# Patient Record
Sex: Female | Born: 1965 | Race: White | Hispanic: No | Marital: Married | Smoking: Never smoker
Health system: Southern US, Community
[De-identification: ages and names within clinical notes are randomized; demographics above are authoritative.]

## PROBLEM LIST (undated history)

## (undated) DIAGNOSIS — E119 Type 2 diabetes mellitus without complications: Secondary | ICD-10-CM

## (undated) DIAGNOSIS — R413 Other amnesia: Secondary | ICD-10-CM

## (undated) DIAGNOSIS — R7303 Prediabetes: Secondary | ICD-10-CM

## (undated) DIAGNOSIS — T50A95A Adverse effect of other bacterial vaccines, initial encounter: Secondary | ICD-10-CM

## (undated) DIAGNOSIS — Z9889 Other specified postprocedural states: Secondary | ICD-10-CM

## (undated) DIAGNOSIS — E559 Vitamin D deficiency, unspecified: Secondary | ICD-10-CM

## (undated) DIAGNOSIS — F419 Anxiety disorder, unspecified: Secondary | ICD-10-CM

## (undated) DIAGNOSIS — M2550 Pain in unspecified joint: Secondary | ICD-10-CM

## (undated) DIAGNOSIS — R4189 Other symptoms and signs involving cognitive functions and awareness: Secondary | ICD-10-CM

## (undated) DIAGNOSIS — R600 Localized edema: Secondary | ICD-10-CM

## (undated) DIAGNOSIS — G629 Polyneuropathy, unspecified: Secondary | ICD-10-CM

## (undated) DIAGNOSIS — M1990 Unspecified osteoarthritis, unspecified site: Secondary | ICD-10-CM

## (undated) DIAGNOSIS — G4730 Sleep apnea, unspecified: Secondary | ICD-10-CM

## (undated) DIAGNOSIS — I509 Heart failure, unspecified: Secondary | ICD-10-CM

## (undated) DIAGNOSIS — L719 Rosacea, unspecified: Secondary | ICD-10-CM

## (undated) DIAGNOSIS — F32A Depression, unspecified: Secondary | ICD-10-CM

## (undated) DIAGNOSIS — R29898 Other symptoms and signs involving the musculoskeletal system: Secondary | ICD-10-CM

## (undated) DIAGNOSIS — I10 Essential (primary) hypertension: Secondary | ICD-10-CM

## (undated) DIAGNOSIS — E669 Obesity, unspecified: Secondary | ICD-10-CM

## (undated) DIAGNOSIS — N950 Postmenopausal bleeding: Secondary | ICD-10-CM

## (undated) DIAGNOSIS — R0600 Dyspnea, unspecified: Secondary | ICD-10-CM

## (undated) DIAGNOSIS — E781 Pure hyperglyceridemia: Secondary | ICD-10-CM

## (undated) DIAGNOSIS — F329 Major depressive disorder, single episode, unspecified: Secondary | ICD-10-CM

## (undated) DIAGNOSIS — E8881 Metabolic syndrome: Secondary | ICD-10-CM

## (undated) DIAGNOSIS — M6250 Muscle wasting and atrophy, not elsewhere classified, unspecified site: Secondary | ICD-10-CM

## (undated) DIAGNOSIS — M549 Dorsalgia, unspecified: Secondary | ICD-10-CM

## (undated) DIAGNOSIS — R262 Difficulty in walking, not elsewhere classified: Secondary | ICD-10-CM

## (undated) DIAGNOSIS — R5383 Other fatigue: Secondary | ICD-10-CM

## (undated) DIAGNOSIS — F488 Other specified nonpsychotic mental disorders: Secondary | ICD-10-CM

## (undated) DIAGNOSIS — R112 Nausea with vomiting, unspecified: Secondary | ICD-10-CM

## (undated) DIAGNOSIS — M797 Fibromyalgia: Secondary | ICD-10-CM

## (undated) HISTORY — DX: Other symptoms and signs involving cognitive functions and awareness: R41.89

## (undated) HISTORY — DX: Dorsalgia, unspecified: M54.9

## (undated) HISTORY — DX: Fibromyalgia: M79.7

## (undated) HISTORY — DX: Rosacea, unspecified: L71.9

## (undated) HISTORY — DX: Heart failure, unspecified: I50.9

## (undated) HISTORY — DX: Adverse effect of other bacterial vaccines, initial encounter: T50.A95A

## (undated) HISTORY — DX: Unspecified osteoarthritis, unspecified site: M19.90

## (undated) HISTORY — DX: Dyspnea, unspecified: R06.00

## (undated) HISTORY — DX: Other symptoms and signs involving the musculoskeletal system: R29.898

## (undated) HISTORY — DX: Metabolic syndrome: E88.810

## (undated) HISTORY — DX: Anxiety disorder, unspecified: F41.9

## (undated) HISTORY — DX: Vitamin D deficiency, unspecified: E55.9

## (undated) HISTORY — DX: Type 2 diabetes mellitus without complications: E11.9

## (undated) HISTORY — DX: Major depressive disorder, single episode, unspecified: F32.9

## (undated) HISTORY — PX: OTHER SURGICAL HISTORY: SHX169

## (undated) HISTORY — DX: Essential (primary) hypertension: I10

## (undated) HISTORY — DX: Localized edema: R60.0

## (undated) HISTORY — DX: Postmenopausal bleeding: N95.0

## (undated) HISTORY — DX: Pain in unspecified joint: M25.50

## (undated) HISTORY — DX: Difficulty in walking, not elsewhere classified: R26.2

## (undated) HISTORY — DX: Muscle wasting and atrophy, not elsewhere classified, unspecified site: M62.50

## (undated) HISTORY — DX: Metabolic syndrome: E88.81

## (undated) HISTORY — DX: Depression, unspecified: F32.A

## (undated) HISTORY — DX: Other amnesia: R41.3

## (undated) HISTORY — DX: Other fatigue: R53.83

## (undated) HISTORY — DX: Pure hyperglyceridemia: E78.1

## (undated) HISTORY — DX: Obesity, unspecified: E66.9

## (undated) HISTORY — DX: Polyneuropathy, unspecified: G62.9

## (undated) HISTORY — DX: Prediabetes: R73.03

---
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
## 2006-01-03 ENCOUNTER — Ambulatory Visit: Payer: Self-pay

## 2006-01-14 ENCOUNTER — Ambulatory Visit: Payer: Self-pay

## 2009-08-11 ENCOUNTER — Encounter: Payer: Self-pay | Admitting: Family Medicine

## 2009-08-12 ENCOUNTER — Ambulatory Visit: Payer: Self-pay | Admitting: Family Medicine

## 2009-08-12 DIAGNOSIS — E8881 Metabolic syndrome: Secondary | ICD-10-CM | POA: Insufficient documentation

## 2009-08-12 DIAGNOSIS — F329 Major depressive disorder, single episode, unspecified: Secondary | ICD-10-CM | POA: Insufficient documentation

## 2009-08-12 DIAGNOSIS — R0609 Other forms of dyspnea: Secondary | ICD-10-CM | POA: Insufficient documentation

## 2009-08-12 DIAGNOSIS — E119 Type 2 diabetes mellitus without complications: Secondary | ICD-10-CM | POA: Insufficient documentation

## 2009-08-12 DIAGNOSIS — R0989 Other specified symptoms and signs involving the circulatory and respiratory systems: Secondary | ICD-10-CM

## 2009-08-12 DIAGNOSIS — E1165 Type 2 diabetes mellitus with hyperglycemia: Secondary | ICD-10-CM | POA: Insufficient documentation

## 2009-08-12 DIAGNOSIS — R030 Elevated blood-pressure reading, without diagnosis of hypertension: Secondary | ICD-10-CM | POA: Insufficient documentation

## 2009-08-18 LAB — CONVERTED CEMR LAB
Alkaline Phosphatase: 69 units/L (ref 39–117)
Basophils Absolute: 0 10*3/uL (ref 0.0–0.1)
Bilirubin, Direct: 0 mg/dL (ref 0.0–0.3)
CO2: 26 meq/L (ref 19–32)
Calcium: 8.7 mg/dL (ref 8.4–10.5)
Eosinophils Absolute: 0.2 10*3/uL (ref 0.0–0.7)
GFR calc non Af Amer: 82.04 mL/min (ref >60)
HCT: 37.1 % (ref 36.0–46.0)
Hemoglobin: 12.5 g/dL (ref 12.0–15.0)
Lymphs Abs: 1.8 10*3/uL (ref 0.7–4.0)
MCHC: 33.8 g/dL (ref 30.0–36.0)
Monocytes Absolute: 0.6 10*3/uL (ref 0.1–1.0)
Neutro Abs: 5.7 10*3/uL (ref 1.4–7.7)
Potassium: 4.2 meq/L (ref 3.5–5.1)
RDW: 13.9 % (ref 11.5–14.6)
Sodium: 139 meq/L (ref 135–145)
Total Protein: 7.1 g/dL (ref 6.0–8.3)

## 2009-09-08 ENCOUNTER — Telehealth: Payer: Self-pay | Admitting: Family Medicine

## 2009-09-15 ENCOUNTER — Ambulatory Visit: Payer: Self-pay | Admitting: Family Medicine

## 2009-09-21 LAB — CONVERTED CEMR LAB
FSH: 4.8 milliintl units/mL
Total CHOL/HDL Ratio: 4

## 2009-10-04 ENCOUNTER — Other Ambulatory Visit: Admission: RE | Admit: 2009-10-04 | Discharge: 2009-10-04 | Payer: Self-pay | Admitting: Family Medicine

## 2009-10-04 ENCOUNTER — Ambulatory Visit: Payer: Self-pay | Admitting: Family Medicine

## 2009-10-04 DIAGNOSIS — E781 Pure hyperglyceridemia: Secondary | ICD-10-CM | POA: Insufficient documentation

## 2009-10-04 DIAGNOSIS — M79606 Pain in leg, unspecified: Secondary | ICD-10-CM | POA: Insufficient documentation

## 2009-10-04 DIAGNOSIS — M79609 Pain in unspecified limb: Secondary | ICD-10-CM | POA: Insufficient documentation

## 2009-10-10 ENCOUNTER — Telehealth: Payer: Self-pay | Admitting: Family Medicine

## 2009-10-12 ENCOUNTER — Telehealth: Payer: Self-pay | Admitting: Family Medicine

## 2009-10-13 ENCOUNTER — Ambulatory Visit: Payer: Self-pay | Admitting: Family Medicine

## 2009-10-14 ENCOUNTER — Encounter (INDEPENDENT_AMBULATORY_CARE_PROVIDER_SITE_OTHER): Payer: Self-pay | Admitting: *Deleted

## 2009-10-14 LAB — CONVERTED CEMR LAB: Pap Smear: NEGATIVE

## 2009-10-20 ENCOUNTER — Ambulatory Visit: Payer: Self-pay | Admitting: Family Medicine

## 2009-10-20 ENCOUNTER — Encounter: Payer: Self-pay | Admitting: Family Medicine

## 2009-11-01 ENCOUNTER — Ambulatory Visit: Payer: Self-pay | Admitting: Family Medicine

## 2010-04-18 ENCOUNTER — Encounter (INDEPENDENT_AMBULATORY_CARE_PROVIDER_SITE_OTHER): Payer: Self-pay | Admitting: *Deleted

## 2010-04-18 ENCOUNTER — Ambulatory Visit: Payer: Self-pay | Admitting: Family Medicine

## 2010-04-25 ENCOUNTER — Ambulatory Visit: Payer: Self-pay | Admitting: Family Medicine

## 2010-05-30 NOTE — Progress Notes (Signed)
Summary: regarding lab work  Phone Note Call from Patient   Caller: Patient Call For: Kerby Nora MD Summary of Call: Pt is coming in next week for fasting labs that were missed at last lab appt.  She has physical scheduled in june and she is asking if there are any other tests that you want to order, such as hormone levels.  She is not having any hormonal problems, she would like to have them checked.  Says her insurance has paid for them in the past and probably will again. Initial call taken by: Lowella Petties CMA,  Sep 08, 2009 11:44 AM  Follow-up for Phone Call        I do not routiinely check estorogen or progesterone as they vary day to day and information from these labs is minimal. If she has concerns about whether she is in perimenopause we can check FSH and LH . Follow-up by: Kerby Nora MD,  Sep 08, 2009 10:55 PM  Additional Follow-up for Phone Call Additional follow up Details #1::        LABS ADDED TO APPT Additional Follow-up by: Benny Lennert CMA Duncan Dull),  Sep 09, 2009 8:50 AM

## 2010-05-30 NOTE — Assessment & Plan Note (Signed)
Summary: CHECK LUMP ON ARM/CLE   Vital Signs:  Patient profile:   45 year old female Height:      67 inches Weight:      380.8 pounds BMI:     59.86 Temp:     98.1 degrees F oral Pulse rate:   96 / minute Pulse rhythm:   regular BP sitting:   120 / 72  (left arm) Cuff size:   large  Vitals Entered By: Benny Lennert CMA Duncan Dull) (October 13, 2009 11:36 AM)  History of Present Illness: Chief complaint check lump on arm from tetnus shot 1 week ago  Recieved the vaccine 1 week ago... in 24 hours area was sore. left arm sore, this resolved.  Then noted lump on left lasteral arm, firm, mildly tender, small amount of eryhtema.  COntinues to decrease in size.   Problems Prior to Update: 1)  Routine Gynecological Examination  (ICD-V72.31) 2)  Preventive Health Care  (ICD-V70.0) 3)  Hypertriglyceridemia  (ICD-272.1) 4)  Knee Pain, Left  (ICD-719.46) 5)  Leg Pain, Bilateral  (ICD-729.5) 6)  Other Screening Mammogram  (ICD-V76.12) 7)  Dyspnea On Exertion  (ICD-786.09) 8)  Glucose Intolerance  (ICD-271.3) 9)  Metabolic Syndrome X  (ICD-277.7) 10)  Elevated Blood Pressure Without Diagnosis of Hypertension  (ICD-796.2) 11)  Obesity, Morbid  (ICD-278.01) 12)  Depression, Major  (ICD-296.20)  Current Medications (verified): 1)  Hydrochlorothiazide 25 Mg Tabs (Hydrochlorothiazide) .Marland Kitchen.. 1 Tab By Mouth Daily 2)  Diclofenac Sodium 75 Mg Tbec (Diclofenac Sodium) .Marland Kitchen.. 1 Tab By Mouth Two Times A Day  Allergies: 1)  ! Penicillin 2)  ! Benadryl  Past History:  Past medical, surgical, family and social histories (including risk factors) reviewed, and no changes noted (except as noted below).  Past Surgical History: Reviewed history from 08/12/2009 and no changes required. 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 father: HTN,  mother: RA, COPD MGM: heart issue, CVA , alcoholism MGF: alcoholism PGM: brain  aneurysm  Social History: Reviewed history from 08/12/2009 and no changes required. 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 Resp:  Denies shortness of breath.  Physical Exam  General:  morbidly obese female in NAD Mouth:  Oral mucosa and oropharynx without lesions or exudates.  Teeth in good repair. Neck:  no carotid bruit or thyromegaly no cervical or supraclavicular lymphadenopathy  Skin:  nodule at area of tentanus vaccine...no erytthema, no fluctuance, no pain   Impression & Recommendations:  Problem # 1:  TETANUS VACC CAUS ADVERSE EFFECT THERAPEUTIC USE (ICD-E948.4) Local inflammatory response. No s/s of infection.  Pt reassure. Self limited.   Complete Medication List: 1)  Hydrochlorothiazide 25 Mg Tabs (Hydrochlorothiazide) .Marland Kitchen.. 1 tab by mouth daily 2)  Diclofenac Sodium 75 Mg Tbec (Diclofenac sodium) .Marland Kitchen.. 1 tab by mouth two times a day  Current Allergies (reviewed today): ! PENICILLIN ! BENADRYL

## 2010-05-30 NOTE — Progress Notes (Signed)
Summary: Knot and feverish at Tdap site  Phone Note Call from Patient Call back at Work Phone [phone]   Caller: Patient Call For: Brandy Nora MD Summary of Call: Patient received a Tdap last Tuesday (10/04/2009).  The area has been very sore, some better today but she says there is a large knot at the injection site and it is feverish to touch.  I suggested ice to the area but should she be concerned that it is still like this when it has been almost a week? Initial call taken by: Delilah Shan CMA Duncan Dull),  October 10, 2009 4:10 PM  Follow-up for Phone Call        . Most likely local inflammation/allergic reaction. Can apply ice, benadryl /zyrtec. ...given present for 1 week...make appt to be seen to make sure no abcess/cellulitis.  Follow-up by: Brandy Nora MD,  October 10, 2009 4:35 PM  Additional Follow-up for Phone Call Additional follow up Details #1::        Patient advised.Consuello Masse CMA   Additional Follow-up by: Benny Lennert CMA Duncan Dull),  October 10, 2009 4:47 PM

## 2010-05-30 NOTE — Assessment & Plan Note (Signed)
Summary: CPX/PAP/DLO   Vital Signs:  Patient profile:   45 year old female Height:      67 inches Weight:      382.4 pounds BMI:     60.11 Temp:     98.1 degrees F oral Pulse rate:   96 / minute Pulse rhythm:   regular BP sitting:   130 / 84  (left arm) Cuff size:   large  Vitals Entered By: Benny Lennert CMA Duncan Dull) (October 04, 2009 3:10 PM)   History of Present Illness: Chief complaint cpx w/ pap  The patient is here for annual wellness exam and preventative care.     Has the additional chronic and aciute issues.  Day to day her biggest issue is b leg pain Greatest apin is in left lateral knee. Also right lower back pain. Bilateral leg pain...down thighs to ankles. Joints stiff in legs. Pain improves some with elevating legs.  At first moving hurts but after a while it helps some.  Ankles feel weak. No joint swelling, no joint redness. Does have swelling in both feet. Ibuprofen..using 6 tabs at a time!!! Helps some...using 2-4 times a day!!!  Has been trying to increase walking recently. Never had any films of back or knees, hips.  Mother has RA...never had testing for RA.   FSH and LH ratio...no clear PCOS  Problems Prior to Update: 1)  Knee Pain, Left  (ICD-719.46) 2)  Leg Pain, Bilateral  (ICD-729.5) 3)  Other Screening Mammogram  (ICD-V76.12) 4)  Dyspnea On Exertion  (ICD-786.09) 5)  Glucose Intolerance  (ICD-271.3) 6)  Metabolic Syndrome X  (ICD-277.7) 7)  Elevated Blood Pressure Without Diagnosis of Hypertension  (ICD-796.2) 8)  Obesity, Morbid  (ICD-278.01) 9)  Depression, Major  (ICD-296.20)  Current Medications (verified): 1)  Hydrochlorothiazide 25 Mg Tabs (Hydrochlorothiazide) .Marland Kitchen.. 1 Tab By Mouth Daily 2)  Diclofenac Sodium 75 Mg Tbec (Diclofenac Sodium) .Marland Kitchen.. 1 Tab By Mouth Two Times A Day  Allergies: 1)  ! Penicillin 2)  ! Benadryl  Past History:  Past medical, surgical, family and social histories (including risk factors) reviewed, and no  changes noted (except as noted below).  Past Surgical History: Reviewed history from 08/12/2009 and no changes required. Benign bone Tumor right leg age 1  Family History: Reviewed history from 08/12/2009 and no changes required. father: HTN,  mother: RA, COPD MGM: heart issue, CVA , alcoholism MGF: alcoholism PGM: brain aneurysm  Social History: Reviewed history from 08/12/2009 and no changes required. 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 CV:  Denies chest pain or discomfort. Resp:  Complains of shortness of breath; denies cough, sputum productive, and wheezing. GI:  Complains of abdominal pain; denies constipation and diarrhea; occ tenderness in epigastrum..none now. GU:  Denies abnormal vaginal bleeding and dysuria. Derm:  Denies rash. Psych:  Denies anxiety and depression.  Physical Exam  General:  morbidly obese female in NAD Eyes:  No corneal or conjunctival inflammation noted. EOMI. Perrla. Funduscopic exam benign, without hemorrhages, exudates or papilledema. Vision grossly normal. Ears:  External ear exam shows no significant lesions or deformities.  Otoscopic examination reveals clear canals, tympanic membranes are intact bilaterally without bulging, retraction, inflammation or discharge. Hearing is grossly normal bilaterally. Nose:  External nasal examination shows no deformity or inflammation. Nasal mucosa are pink and moist without lesions or exudates. Mouth:  Oral mucosa and oropharynx without lesions or exudates.  Teeth in good repair. Neck:  no carotid bruit or thyromegaly no cervical or supraclavicular lymphadenopathy  Chest Wall:  No deformities, masses, or tenderness noted. Breasts:  No mass, nodules, thickening, tenderness, bulging, retraction, inflamation, nipple  discharge or skin changes noted.   Lungs:  Normal respiratory effort, chest expands symmetrically. Lungs are clear to auscultation, no crackles or wheezes. Heart:  Normal rate and regular rhythm. S1 and S2 normal without gallop, murmur, click, rub or other extra sounds. Abdomen:  Bowel sounds positive,abdomen soft and non-tender without masses, organomegaly or hernias noted. Genitalia:  Pelvic Exam:        External: normal female genitalia without lesions or masses        Vagina: normal without lesions or masses        Cervix: normal without lesions or masses        Adnexa: normal bimanual exam without masses or fullness        Uterus: normal by palpation        Pap smear: performed Msk:  Given obesity difficult joint exam. No clear effusion or erythema, ttp in latereal joint line, full ROM of knees B.  TTP in right lower back, over paraspinous muslces, no central vertebral pain, neg Faber's and SLR Pulses:  R and L posterior tibial pulses are full and equal bilaterally  Extremities:  no edema Neurologic:  No cranial nerve deficits noted. Station and gait are normal. Plantar reflexes are down-going bilaterally. DTRs are symmetrical throughout. Sensory, motor and coordinative functions appear intact. Skin:  Intact without suspicious lesions or rashes Psych:  Cognition and judgment appear intact. Alert and cooperative with normal attention span and concentration. No apparent delusions, illusions, hallucinations   Impression & Recommendations:  Problem # 1:  Preventive Health Care (ICD-V70.0) The patient's preventative maintenance and recommended screening tests for an annual wellness exam were reviewed in full today. Brought up to date unless services declined.  Counselled on the importance of diet, exercise, and its role in overall health and mortality. Empahsised that al lot of her healthy issues would resolve with significant weight loss. Encouraged her to consider bariatric surgery. she  states "I want to try myself first with lifestyle changes" The patient's FH and SH was reviewed, including their home life, tobacco status, and drug and alcohol status.     Problem # 2:  Gynecological examination-routine (ICD-V72.31) PAP pending   Problem # 3:  KNEE PAIN, LEFT (ICD-719.46) Likely osteoarthritis changes due to weight . Will eval with plain films.  Overusing OTC meds..stop. Start diclofenac, stretching . If not imrpoving consider referral to specialist for injection vs further eval.  Also may consider RA eval given family hisptry in symtpoms not improving.  Her updated medication list for this problem includes:    Diclofenac Sodium 75 Mg Tbec (Diclofenac sodium) .Marland Kitchen... 1 tab by mouth two times a day  Orders: Radiology Referral (Radiology)  Problem # 4:  LEG PAIN, BILATERAL (ICD-729.5) Likely multifactorial. Nml labs. Treat knee pain and edema as state in #3 and #5.   Problem # 5:  HYPERTRIGLYCERIDEMIA (ICD-272.1)  Discussed diet and lifestyle  change. need for drastic weight loss. Start fish oil 2000 mg daily. Recheck fasting LIPIDS, AST, ALT  in 3 months Dx 272.0     Problem # 6:  ELEVATED BLOOD PRESSURE WITHOUT DIAGNOSIS OF HYPERTENSION (ICD-796.2) Along with peripheral edema. Start diuretic.Marland KitchenHCTZ. MAy improved B leg pain as well. Elevate legs and consider  compression hose 15-30 mmHG.  Her updated medication list for this problem includes:    Hydrochlorothiazide 25 Mg Tabs (Hydrochlorothiazide) .Marland Kitchen... 1 tab by mouth daily  Complete Medication List: 1)  Hydrochlorothiazide 25 Mg Tabs (Hydrochlorothiazide) .Marland Kitchen.. 1 tab by mouth daily 2)  Diclofenac Sodium 75 Mg Tbec (Diclofenac sodium) .Marland Kitchen.. 1 tab by mouth two times a day  Other Orders: Tdap => 65yrs IM (16109) Admin 1st Vaccine (60454) Admin 1st Vaccine [facility]) [phone])  Patient Instructions: 1)  Referral Appointment Information 2)  Day/Date: 3)  Time: 4)  Place/MD: 5)  Address: 6)  Phone/Fax: 7)  Patient given  appointment information. Information/Orders faxed/mailed.  8)  Start fish oil 2000 mg divided daily. 9)  Please schedule a follow-up appointment in 1 month 30 min OV.  Prescriptions: HYDROCHLOROTHIAZIDE 25 MG TABS (HYDROCHLOROTHIAZIDE) 1 tab by mouth daily  #30 x 3   Entered and Authorized by:   Kerby Nora MD   Signed by:   Kerby Nora MD on 10/04/2009   Method used:   Electronically to        [facility] [phone]* (retail)       [address] [place], [place]  [address]       Ph: [phone] or [phone]       Fax: [phone]   RxID:   [id number] DICLOFENAC SODIUM 75 MG TBEC (DICLOFENAC SODIUM) 1 tab by mouth two times a day  #60 x 0   Entered and Authorized by:   Kerby Nora MD   Signed by:   Kerby Nora MD on 10/04/2009   Method used:   Electronically to        [facility] [phone]* (retail)       [address] [place], [place]  [address]       Ph: [phone] or [phone]       Fax: [phone]   RxID:   [id number]   Current Allergies (reviewed today): ! PENICILLIN ! BENADRYL       Tetanus/Td Vaccine    Vaccine Type: Tdap    Site: left deltoid    Mfr: GlaxoSmithKline    Dose: 0.5 ml    Route: IM    Given by: Benny Lennert CMA (AAMA)    Exp. Date: 07/23/2011    Lot #: [id number]    VIS given: 03/18/07 version given October 04, 2009.  Appended Document: CPX/PAP/DLO Please call pt we have not received X-ray results..has she had these done?
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
## 2010-05-30 NOTE — Progress Notes (Signed)
Summary: List of Physicians & Concerns & Questionnaire from [facility]   List of Physicians & Concerns & Questionnaire from [facility] by Patient   Imported By: Lanelle Bal 08/17/2009 11:15:37  _____________________________________________________________________  External Attachment:    Type:   Image     Comment:   External Document
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
## 2010-05-30 NOTE — Assessment & Plan Note (Signed)
Summary: ROA FOR 1 MONTH FOLLOW-UP/JRR   Vital Signs:  Patient profile:   45 year old female Height:      67 inches Weight:      374.2 pounds BMI:     58.82 Temp:     99.0 degrees F oral Pulse rate:   90 / minute Pulse rhythm:   regular BP sitting:   110 / 78  (left arm) Cuff size:   large  Vitals Entered By: Benny Lennert CMA Duncan Dull) (November 01, 2009 11:58 AM)  History of Present Illness: Chief complaint 1 month follow up  Right knee pain...improved (pain 50 % better) on diclofenac. Walking better (65 % improvement) Still has B leg pain...mother with RA in mother.  Has increase exercise..but not yet on regular routine.  8 lb weight loss. Trying to do more each day.  HTN, improved control on HCTZ.  No SE to this medication. Peripheral swelling much better.  Problems Prior to Update: 1)  Tetanus Vacc Caus Adverse Effect Therapeutic Use  (ICD-E948.4) 2)  Routine Gynecological Examination  (ICD-V72.31) 3)  Preventive Health Care  (ICD-V70.0) 4)  Hypertriglyceridemia  (ICD-272.1) 5)  Knee Pain, Left  (ICD-719.46) 6)  Leg Pain, Bilateral  (ICD-729.5) 7)  Other Screening Mammogram  (ICD-V76.12) 8)  Dyspnea On Exertion  (ICD-786.09) 9)  Glucose Intolerance  (ICD-271.3) 10)  Metabolic Syndrome X  (ICD-277.7) 11)  Elevated Blood Pressure Without Diagnosis of Hypertension  (ICD-796.2) 12)  Obesity, Morbid  (ICD-278.01) 13)  Depression, Major  (ICD-296.20)  Current Medications (verified): 1)  Hydrochlorothiazide 25 Mg Tabs (Hydrochlorothiazide) .Marland Kitchen.. 1 Tab By Mouth Daily 2)  Diclofenac Sodium 75 Mg Tbec (Diclofenac Sodium) .Marland Kitchen.. 1 Tab By Mouth Two Times A Day  Allergies: 1)  ! Penicillin 2)  ! Benadryl  Past History:  Past medical, surgical, family and social histories (including risk factors) reviewed, and no changes noted (except as noted below).  Past Surgical History: Reviewed history from 08/12/2009 and no changes required. Benign bone Tumor right leg age 60  Family  History: Reviewed history from 08/12/2009 and no changes required. father: HTN,  mother: RA, COPD MGM: heart issue, CVA , alcoholism MGF: alcoholism PGM: brain aneurysm  Social History: Reviewed history from 08/12/2009 and no changes required. Occupation: Best boy Married 3 kids: obesity, middle child with autsim, severe Never Smoked Alcohol use-yes, rare Drug use-yes, marijuana remote Regular exercise-no  Review of Systems General:  Denies fatigue and fever. CV:  Denies chest pain or discomfort. Resp:  Denies shortness of breath. GI:  Denies abdominal pain. GU:  Denies dysuria.  Physical Exam  General:  Well-developed,well-nourished,in no acute distress; alert,appropriate and cooperative throughout examination Mouth:  Oral mucosa and oropharynx without lesions or exudates.  Teeth in good repair. Neck:  no carotid bruit or thyromegaly no cervical or supraclavicular lymphadenopathy  Lungs:  Normal respiratory effort, chest expands symmetrically. Lungs are clear to auscultation, no crackles or wheezes. Heart:  Normal rate and regular rhythm. S1 and S2 normal without gallop, murmur, click, rub or other extra sounds. Msk:  Given obesity difficult joint exam. No clear effusion or erythema, ttp in latereal joint line, full ROM of knees B.  TTP in right lower back, over paraspinous muslces, no central vertebral pain, neg Faber's and SLR Pulses:  R and L posterior tibial pulses are full and equal bilaterally  Extremities:  no edema Skin:  Intact without suspicious lesions or rashes   Impression & Recommendations:  Problem # 1:  HYPERTRIGLYCERIDEMIA (ICD-272.1)  Discussed diet changes, weight loss and exercsie...recheck in 6 months.  Problem # 2:  KNEE PAIN, LEFT (ICD-719.46) Improved with NSAIDs...continue regular stretching.  Her updated medication list for this problem includes:    Diclofenac Sodium 75 Mg Tbec (Diclofenac sodium) .Marland Kitchen... 1 tab by mouth two times a  day  Problem # 3:  LEG PAIN, BILATERAL (ICD-729.5) Likely multifactorial, but closely related to obesity...work on [employer] and weight loss.   Problem # 4:  ELEVATED BLOOD PRESSURE WITHOUT DIAGNOSIS OF HYPERTENSION (ICD-796.2) Improved with HCTZ. less fluid retention.  Her updated medication list for this problem includes:    Hydrochlorothiazide 25 Mg Tabs (Hydrochlorothiazide) .Marland Kitchen... 1 tab by mouth daily  Complete Medication List: 1)  Hydrochlorothiazide 25 Mg Tabs (Hydrochlorothiazide) .Marland Kitchen.. 1 tab by mouth daily 2)  Diclofenac Sodium 75 Mg Tbec (Diclofenac sodium) .Marland Kitchen.. 1 tab by mouth two times a day  Patient Instructions: 1)  Continue to work on exercsie and weight loss. 2)   Use diclofenac as needed. 3)  Fasting lipids, CMET Dx 272.0, RF, antiCCP antibodies, Sed Rate Dx 729.5 in 6 months. 4)  Follow up appt in 6 months.   Current Allergies (reviewed today): ! PENICILLIN ! BENADRYL  Appended Document: Orders Update    Clinical Lists Changes  Observations: Added new observation of MAMMO DUE: 10/21/2010 (11/02/2009 17:03) Added new observation of LAST MAM DAT: 10/20/2009 (10/20/2009 17:04) Added new observation of MAMMOGRAM: normal (10/20/2009 17:04) Added new observation of LAST MAM DAT: 10/20/2009 (10/20/2009 7:24) Added new observation of MAMMOGRAM: normal (10/20/2009 7:24)      Mammogram Result Date:  10/20/2009 Mammogram Result:  normal Mammogram Next Due:  1 yr Notify patient of results. Kerby Nora MD  November 02, 2009 5:04 PM  Patient advised.Consuello Masse CMA

## 2010-05-30 NOTE — Letter (Signed)
Summary: Results Follow up Letter  [place] at [facility]  [address] [place], [place] [address]   Phone: [phone]  Fax: [phone]    10/14/2009 MRN: [id number]     TORA PRUNTY [address] [place], [place]  [address]    Dear Ms. Adrian Prows,  The following are the results of your recent test(s):  Test         Result    Pap Smear:        Normal __x___  Not Normal _____ Comments:Repeat in 1 year also STD test neg ______________________________________________________ Cholesterol: LDL(Bad cholesterol):         Your goal is less than:         HDL (Good cholesterol):       Your goal is more than: Comments:  ______________________________________________________ Mammogram:        Normal _____  Not Normal _____ Comments:  ___________________________________________________________________ Hemoccult:        Normal _____  Not normal _______ Comments:    _____________________________________________________________________ Other Tests:    We routinely do not discuss normal results over the telephone.  If you desire a copy of the results, or you have any questions about this information we can discuss them at your next office visit.   Sincerely,   Kerby Nora MD

## 2010-06-01 NOTE — Letter (Signed)
Summary: [place] No Show Letter  [place] at [facility]  [address] [place], [place] [address]   Phone: [phone]  Fax: [phone]    04/18/2010 MRN: [id number]  Brandy [name] [address] [place], [place]  [address]   Dear Ms. Adrian Prows,   Our records indicate that you missed your scheduled appointment with ____LAB_________________ on __12.20.2011__________.  Please contact this office to reschedule your appointment as soon as possible.  It is important that you keep your scheduled appointments with your physician, so we can provide you the best care possible.  Please be advised that there may be a charge for "no show" appointments.    Sincerely,   [place] at [facility]

## 2010-09-14 ENCOUNTER — Encounter: Payer: Self-pay | Admitting: Family Medicine

## 2010-09-14 ENCOUNTER — Ambulatory Visit (INDEPENDENT_AMBULATORY_CARE_PROVIDER_SITE_OTHER): Payer: BC Managed Care – PPO | Admitting: Family Medicine

## 2010-09-14 VITALS — BP 140/88 | HR 101 | Temp 97.8°F | Ht 67.0 in | Wt 375.1 lb

## 2010-09-14 DIAGNOSIS — Z13220 Encounter for screening for lipoid disorders: Secondary | ICD-10-CM

## 2010-09-14 DIAGNOSIS — R0609 Other forms of dyspnea: Secondary | ICD-10-CM

## 2010-09-14 DIAGNOSIS — I10 Essential (primary) hypertension: Secondary | ICD-10-CM

## 2010-09-14 DIAGNOSIS — R000 Tachycardia, unspecified: Secondary | ICD-10-CM

## 2010-09-14 MED ORDER — LISINOPRIL-HYDROCHLOROTHIAZIDE 10-12.5 MG PO TABS: 1.0000 | ORAL_TABLET | Freq: Every day | ORAL | Status: DC

## 2010-09-14 NOTE — Progress Notes (Signed)
45 year old female morbidly obese with a BMI of 59 presents with HTN and intemittent tachycardia:  Feels like her blood pressure has been elevated.  Heart has been racing some, and also having some trouble breathing.   Not skipping beats, going fast.   Neither of her parents or her siblings had heart probs. PGM and PGF had heart problems.   1. Hypertension: Diagnosed a few years ago, she was on hydrochlorothiazide 25 mg daily, however she did discontinue this when she had some lightheaded sensations. She has not been on this for many months. On my recheck, her blood pressure is 150/90. She has been having some intermittent headaches and flushing.  2. Tachycardia: Without provocation or without exercise or strenuous activity, even while laying down or sitting, the patient will intermittently have her heart rate accelerate and lasts anywhere from a few minutes to 20 minutes or more at a time. This has been happening a majority of days. Currently this is not occurring. No chest pain is associated. She does have some shortness of breath intermittently. These episodes are not associated with any kind of provokable anxiety. She does feel somewhat nervous at that time. No substernal chest pain. No arm numbness or jaw pain or neck pain.  Cardiac risk factors include morbid obesity BMI of 59. Hypertension. Last lipid panel was relatively normal. Paternal grandmother and grandfather both had cardiac problems, but no direct parents or siblings with cardiac problems.  Lab Results  Component Value Date   CHOL 185 09/15/2009   Lab Results  Component Value Date   HDL 47.00 09/15/2009   Lab Results  Component Value Date   LDLCALC 101* 09/15/2009   Lab Results  Component Value Date   TRIG 184.0* 09/15/2009   Lab Results  Component Value Date   CHOLHDL 4 09/15/2009   No results found for this basename: LDLDIRECT   The PMH, PSH, Social History, Family History, Medications, and allergies have been  reviewed in [facility], and have been updated if relevant.  ROS: GEN: No acute illnesses, no fevers, chills. GI: No n/v/d, eating normally Pulm: above Interactive and getting along well at home.  Otherwise, ROS is as per the HPI.  GEN: WDWN, NAD, Non-toxic, A & O x 3. Morbidly obese. HEENT: Atraumatic, Normocephalic. Neck supple. No masses, No LAD. Ears and Nose: No external deformity. CV: RRR, No M/G/R. No JVD. No thrill. No extra heart sounds. PULM: CTA B, no wheezes, crackles, rhonchi. No retractions. No resp. distress. No accessory muscle use.  EXTR: No c/c/e NEURO Normal gait.  PSYCH: Normally interactive. Conversant. Not depressed or anxious appearing.  Calm demeanor.   A/P: 1. HTN, unstable, start low dose zestoretic. Patient will be seeing Cardiology within a few weeks - can adjust in their office if needed 2. Tachycardia: No exact history of palpitations, but tachycardia, the patient gives a very good history. At this point is not defined. She also has shortness of breath. Think the potential arrhythmia needs to be fully about related. It's also concerning given that the patient has a BMI of 59.  EKG: Normal sinus rhythm. Normal axis, normal R wave progression, No acute ST elevation or depression. Nonspecific t wave flattening, multiple leads  Consult cardiology for assistance.

## 2010-09-14 NOTE — Patient Instructions (Signed)
REFERRAL: GO THE THE FRONT ROOM AT THE ENTRANCE OF OUR CLINIC, NEAR CHECK IN. ASK FOR MARION. SHE WILL HELP YOU SET UP YOUR REFERRAL. DATE: TIME:  

## 2010-09-15 LAB — BASIC METABOLIC PANEL
CO2: 23 mEq/L (ref 19–32)
Calcium: 8.5 mg/dL (ref 8.4–10.5)
Creatinine, Ser: 0.8 mg/dL (ref 0.4–1.2)
Glucose, Bld: 105 mg/dL — ABNORMAL HIGH (ref 70–99)

## 2010-09-19 ENCOUNTER — Telehealth: Payer: Self-pay | Admitting: Cardiovascular Disease

## 2010-09-19 NOTE — Telephone Encounter (Signed)
LMOM to remind pt of appt tomorrow.

## 2010-09-19 NOTE — Telephone Encounter (Signed)
LMOM to remind of appt tomorrow.

## 2010-09-20 ENCOUNTER — Ambulatory Visit: Payer: BC Managed Care – PPO | Admitting: Cardiovascular Disease

## 2010-09-20 ENCOUNTER — Ambulatory Visit (INDEPENDENT_AMBULATORY_CARE_PROVIDER_SITE_OTHER): Payer: BC Managed Care – PPO | Admitting: Cardiovascular Disease

## 2010-09-20 ENCOUNTER — Encounter: Payer: Self-pay | Admitting: Cardiovascular Disease

## 2010-09-20 VITALS — BP 136/68 | HR 97 | Ht 67.0 in | Wt 366.0 lb

## 2010-09-20 DIAGNOSIS — R0989 Other specified symptoms and signs involving the circulatory and respiratory systems: Secondary | ICD-10-CM

## 2010-09-20 DIAGNOSIS — R002 Palpitations: Secondary | ICD-10-CM | POA: Insufficient documentation

## 2010-09-20 DIAGNOSIS — R000 Tachycardia, unspecified: Secondary | ICD-10-CM

## 2010-09-20 DIAGNOSIS — R0609 Other forms of dyspnea: Secondary | ICD-10-CM

## 2010-09-20 DIAGNOSIS — I10 Essential (primary) hypertension: Secondary | ICD-10-CM

## 2010-09-20 MED ORDER — NEBIVOLOL HCL 5 MG PO TABS: 5.0000 mg | ORAL_TABLET | Freq: Every day | ORAL | Status: DC

## 2010-09-20 NOTE — Assessment & Plan Note (Signed)
Her palpitations/tachycardia at nighttime is not fast when she records it. It is still in a reasonable rate but does feel stronger. I do not think this is secondary to underlying arrhythmia. It could be secondary to stress or high blood pressure. Symptoms have currently improved once she restarted lisinopril/HCT. She does report the HCT makes her go to the bathroom frequently. Her heart rate on today's visit as well as with Dr. Dallas Schimke was elevated with a rate close to 100. On clinical exam her rate was also elevated. I have given her some samples of bystolic 5 mg  to try 2 slow her rate. If her shortness of breath improves with a slower heart rate, this may be a better medication than lisinopril HCT.   We could also use metoprolol tartrate p.r.n. For palpitations at nighttime. He could take this at dinnertime he on a regular basis or p.r.n..

## 2010-09-20 NOTE — Assessment & Plan Note (Signed)
Blood pressure is well controlled today. She did take lisinopril HCT or low-dose beta blocker on a regular basis.

## 2010-09-20 NOTE — Patient Instructions (Addendum)
Follow up with Dr. Mariah Milling as needed. Start on Bystolic 5 mg take one tablet daily. Samples given for 6 weeks.  Call for a prescription if you can tolerate it.

## 2010-09-20 NOTE — Progress Notes (Signed)
   Patient ID: Brandy [name], female    DOB: 11/06/1965, 45 y.o.   MRN: [id number]  HPI Comments: Brandy [name] is a very pleasant 45 year old woman with morbid obesity, hypertension, history of anxiety who presents by referral from Dr. Ermalene Searing, for symptoms of tachycardia and evenings.  She reports that over the past 2 months, she has had symptoms of pounding in her chest lasting for 15-20 minutes at a time. It resolves after she sits up, takes an aspirin, goes to the bathroom and then goes back to bed. She wonders if it could be anxiety. There were no new stressors in her life. She does have a new granddaughter she reports that this is not a stressor. Her work is the same, her habits and medications are the same.  She did restart her lisinopril HCT this past week and since then, her chest palpitations and tachycardia has improved. She has been monitoring her heart rate and in general has heart rates in the 80s to 90s a regular basis. She does have shortness of breath with exertion and wonders if this could be secondary to her weight. She used to work out, and she has not been working out recently.  EKG shows normal sinus rhythm with rate 97 beats per minute with T wave abnormality in V4 through V6, 2, 3, aVF one, aVL     Review of Systems  Constitutional: Negative.   HENT: Negative.   Eyes: Negative.   Respiratory: Positive for shortness of breath.   Cardiovascular: Positive for palpitations.  Gastrointestinal: Negative.   Musculoskeletal: Negative.   Skin: Negative.   Neurological: Negative.   Hematological: Negative.   Psychiatric/Behavioral: Negative.   All other systems reviewed and are negative.    BP 136/68  Pulse 97  Ht 5\' 7"  (1.702 m)  Wt 366 lb (166.017 kg)  BMI 57.32 kg/m2   Physical Exam  Nursing note and vitals reviewed. Constitutional: She is oriented to person, place, and time. She appears well-developed and well-nourished.       Morbidly obese  HENT:  Head:  Normocephalic.  Nose: Nose normal.  Mouth/Throat: Oropharynx is clear and moist.  Eyes: Conjunctivae are normal. Pupils are equal, round, and reactive to light.  Neck: Normal range of motion. Neck supple. No JVD present.  Cardiovascular: Normal rate, regular rhythm, S1 normal, S2 normal, normal heart sounds and intact distal pulses.  Exam reveals no gallop and no friction rub.   No murmur heard. Pulmonary/Chest: Effort normal and breath sounds normal. No respiratory distress. She has no wheezes. She has no rales. She exhibits no tenderness.  Abdominal: Soft. Bowel sounds are normal. She exhibits no distension. There is no tenderness.  Musculoskeletal: Normal range of motion. She exhibits no edema and no tenderness.  Lymphadenopathy:    She has no cervical adenopathy.  Neurological: She is alert and oriented to person, place, and time. Coordination normal.  Skin: Skin is warm and dry. No rash noted. No erythema.  Psychiatric: She has a normal mood and affect. Her behavior is normal. Judgment and thought content normal.         Assessment and Plan

## 2010-09-20 NOTE — Assessment & Plan Note (Signed)
We have encouraged her to increase her exercise, change her diet. She does have a membership to the gym and we've suggested low impact aerobics such as recumbent bike and swimming. As a last resort, she could consider LAP-BAND.

## 2010-09-20 NOTE — Assessment & Plan Note (Signed)
I suspect her shortness of breath is secondary to her weight, general deconditioning, possibly elevated heart rate.

## 2010-11-02 ENCOUNTER — Other Ambulatory Visit: Payer: Self-pay | Admitting: Cardiovascular Disease

## 2010-11-02 MED ORDER — NEBIVOLOL HCL 5 MG PO TABS: 5.0000 mg | ORAL_TABLET | Freq: Every day | ORAL | Status: DC

## 2010-12-06 ENCOUNTER — Telehealth: Payer: Self-pay

## 2010-12-06 MED ORDER — NEBIVOLOL HCL 5 MG PO TABS: 5.0000 mg | ORAL_TABLET | Freq: Every day | ORAL | Status: DC

## 2010-12-06 NOTE — Telephone Encounter (Signed)
She is tolerating the bystolic 5 mg one tablet daily.  We will send in a new Rx to [facility].

## 2011-01-09 ENCOUNTER — Ambulatory Visit (INDEPENDENT_AMBULATORY_CARE_PROVIDER_SITE_OTHER): Payer: BC Managed Care – PPO | Admitting: Family Medicine

## 2011-01-09 ENCOUNTER — Encounter: Payer: Self-pay | Admitting: Family Medicine

## 2011-01-09 VITALS — BP 136/88 | HR 74 | Temp 98.4°F | Wt 372.0 lb

## 2011-01-09 DIAGNOSIS — R059 Cough, unspecified: Secondary | ICD-10-CM

## 2011-01-09 DIAGNOSIS — J40 Bronchitis, not specified as acute or chronic: Secondary | ICD-10-CM

## 2011-01-09 DIAGNOSIS — R05 Cough: Secondary | ICD-10-CM

## 2011-01-09 MED ORDER — GUAIFENESIN-CODEINE 100-10 MG/5ML PO SYRP: 5.0000 mL | ORAL_SOLUTION | Freq: Two times a day (BID) | ORAL | Status: DC | PRN

## 2011-01-09 MED ORDER — AZITHROMYCIN 250 MG PO TABS: ORAL_TABLET | ORAL | Status: AC

## 2011-01-09 NOTE — Assessment & Plan Note (Signed)
Going on 2 wks.  Cough seems to be worsening per pt. Treat with zpack and cheratussin. Update if not improving as expected.

## 2011-01-09 NOTE — Progress Notes (Signed)
  Subjective:    Patient ID: Brandy [name], female    DOB: 09-28-1965, 45 y.o.   MRN: [id number]  HPI CC: chest congestion  2 wk h/o chest congestion.  Started with ST, then progressed to chest and head congestion, PNDrip.  Head congestion improved, now feels stuck in chest.  Taking ibuprofen for throat which continues to hurt.  + coughing worse at night, productive of clear sputum.  + ear pain bilaterally.  + tooth aches.  Some nausea.  Mild sinus congestion.  No fevers/chills, abd pain, vomiting.  Holding bp meds until sees Dr. Mariah Milling next week.  Son has been sick as well.  No smokers at home.  No h/o asthma or COPD.  Review of Systems Per HPI    Objective:   Physical Exam  Nursing note and vitals reviewed. Constitutional: She appears well-developed and well-nourished. No distress.  HENT:  Head: Normocephalic and atraumatic.  Right Ear: Hearing, external ear and ear canal normal.  Left Ear: Hearing, external ear and ear canal normal.  Nose: Nose normal. No mucosal edema or rhinorrhea. Right sinus exhibits no maxillary sinus tenderness and no frontal sinus tenderness. Left sinus exhibits no maxillary sinus tenderness and no frontal sinus tenderness.  Mouth/Throat: Uvula is midline. Posterior oropharyngeal erythema present. No oropharyngeal exudate, posterior oropharyngeal edema or tonsillar abscesses.       Bilateral congested behind tms  Eyes: Conjunctivae and EOM are normal. Pupils are equal, round, and reactive to light. No scleral icterus.  Neck: Normal range of motion. Neck supple.  Cardiovascular: Normal rate, regular rhythm, normal heart sounds and intact distal pulses.   No murmur heard. Pulmonary/Chest: Effort normal and breath sounds normal. No respiratory distress. She has no wheezes. She has no rales.  Musculoskeletal: She exhibits no edema.  Lymphadenopathy:    She has no cervical adenopathy.  Skin: Skin is warm and dry. No rash noted.  Psychiatric: She has a normal  mood and affect.          Assessment & Plan:

## 2011-01-09 NOTE — Patient Instructions (Signed)
Sounds like bronchitis.  Take zpack as prescribed along with cheratussin for night time cough. Push fluids and plenty of rest. Update [place] if not improving as expected.

## 2011-01-16 ENCOUNTER — Encounter: Payer: Self-pay | Admitting: Cardiovascular Disease

## 2011-01-16 ENCOUNTER — Encounter: Payer: BC Managed Care – PPO | Admitting: Cardiovascular Disease

## 2011-01-26 ENCOUNTER — Encounter: Payer: Self-pay | Admitting: Cardiovascular Disease

## 2011-01-30 ENCOUNTER — Encounter: Payer: Self-pay | Admitting: Cardiovascular Disease

## 2011-01-30 ENCOUNTER — Ambulatory Visit (INDEPENDENT_AMBULATORY_CARE_PROVIDER_SITE_OTHER): Payer: BC Managed Care – PPO | Admitting: Cardiovascular Disease

## 2011-01-30 DIAGNOSIS — I10 Essential (primary) hypertension: Secondary | ICD-10-CM

## 2011-01-30 DIAGNOSIS — R002 Palpitations: Secondary | ICD-10-CM

## 2011-01-30 NOTE — Assessment & Plan Note (Signed)
She was concerned about bradycardia at night time while taking the low dose beta blocker. She does feel well on the medication otherwise. We have suggested she take either a 2.5 or 5 mg tablet in the morning. Currently she takes medication at nighttime. I've asked her to monitor her heart rate and blood pressure.

## 2011-01-30 NOTE — Progress Notes (Signed)
Patient ID: Brandy [name], female    DOB: Jan 22, 1966, 45 y.o.   MRN: [id number]  HPI Comments: Brandy [name] is a very pleasant 45 year old woman with morbid obesity, hypertension, history of anxiety, patient of Dr. Ermalene Searing, previous seen for symptoms of tachycardia and HTN.  She states that she has been taking Bystolic 5 mg QHS and has felt very well on the medication. In fact, she "loves" the medication. Her breathing is better, less swelling, more energy, less palpitations. She is not taking lisinopril.  She did stop the bystolic recently after she noted a slow heart rate with dizziness in the middle of the night. She reports a heart rate in the 40s. She goes upstairs to go to the bathroom and felt dizzy. She has stopped the medication and since then has felt well. She did not have episodes of bradycardia or dizziness prior to this and has been taking the medication since May 2012.   She feels that since she stopped the medication, everything is slowly going back to how it was with faster heart rate, increasing shortness of breath. She wonders if she can restart the medication   EKG shows normal sinus rhythm with rate 85 beats per minute with No significant ST or T wave changes    Outpatient Encounter Prescriptions as of 01/30/2011  Medication Sig Dispense Refill  . aspirin (ASPIRIN EC) 81 MG EC tablet Take 81 mg by mouth daily.        . nebivolol (BYSTOLIC) 5 MG tablet Take 1 tablet (5 mg total) by mouth daily. (on hold)  30 tablet  6  . DISCONTD: lisinopril-hydrochlorothiazide (PRINZIDE,ZESTORETIC) 10-12.5 MG per tablet Take 1 tablet by mouth daily.  30 tablet  11     Review of Systems  Constitutional: Negative.   HENT: Negative.   Eyes: Negative.   Respiratory: Negative.   Cardiovascular: Negative.   Gastrointestinal: Negative.   Musculoskeletal: Negative.   Skin: Negative.   Neurological: Negative.   Hematological: Negative.   Psychiatric/Behavioral: Negative.   All other  systems reviewed and are negative.    BP 148/88  Pulse 85  Ht 5\' 7"  (1.702 m)  Wt 366 lb (166.017 kg)  BMI 57.32 kg/m2  LMP 12/17/2010  Physical Exam  Nursing note and vitals reviewed. Constitutional: She is oriented to person, place, and time. She appears well-developed and well-nourished.       Obesity  HENT:  Head: Normocephalic.  Nose: Nose normal.  Mouth/Throat: Oropharynx is clear and moist.  Eyes: Conjunctivae are normal. Pupils are equal, round, and reactive to light.  Neck: Normal range of motion. Neck supple. No JVD present.  Cardiovascular: Normal rate, regular rhythm, S1 normal, S2 normal, normal heart sounds and intact distal pulses.  Exam reveals no gallop and no friction rub.   No murmur heard. Pulmonary/Chest: Effort normal and breath sounds normal. No respiratory distress. She has no wheezes. She has no rales. She exhibits no tenderness.  Abdominal: Soft. Bowel sounds are normal. She exhibits no distension. There is no tenderness.  Musculoskeletal: Normal range of motion. She exhibits no edema and no tenderness.  Lymphadenopathy:    She has no cervical adenopathy.  Neurological: She is alert and oriented to person, place, and time. Coordination normal.  Skin: Skin is warm and dry. No rash noted. No erythema.  Psychiatric: She has a normal mood and affect. Her behavior is normal. Judgment and thought content normal.         Assessment and Plan

## 2011-01-30 NOTE — Assessment & Plan Note (Signed)
We have encouraged continued exercise, careful diet management in an effort to lose weight. 

## 2011-01-30 NOTE — Assessment & Plan Note (Signed)
Palpitations seemed to have improved. She has not had evening palpitations as on previous visit for quite some time.

## 2011-01-30 NOTE — Patient Instructions (Signed)
You are doing well. Please continue bystolic 2.5 mg once in the Am or twice a day Please call us if you have new issues that need to be addressed before your next appt.  We will call you for a follow up Appt. In 12 months

## 2011-03-26 ENCOUNTER — Encounter: Payer: Self-pay | Admitting: Family Medicine

## 2011-03-26 ENCOUNTER — Ambulatory Visit (INDEPENDENT_AMBULATORY_CARE_PROVIDER_SITE_OTHER): Payer: BC Managed Care – PPO | Admitting: Family Medicine

## 2011-03-26 VITALS — BP 136/80 | HR 88 | Temp 97.9°F | Wt 373.5 lb

## 2011-03-26 DIAGNOSIS — J40 Bronchitis, not specified as acute or chronic: Secondary | ICD-10-CM

## 2011-03-26 MED ORDER — AZITHROMYCIN 250 MG PO TABS: ORAL_TABLET | ORAL | Status: AC

## 2011-03-26 MED ORDER — ALBUTEROL 90 MCG/ACT IN AERS: 2.0000 | INHALATION_SPRAY | Freq: Four times a day (QID) | RESPIRATORY_TRACT | Status: DC | PRN

## 2011-03-26 NOTE — Assessment & Plan Note (Signed)
only going on 6 days but did have wheezing right upper lung field, reported fever. Treat as bronchitis with zpack, albuterol for RAD component. Update [place] if not improving as expected.

## 2011-03-26 NOTE — Progress Notes (Signed)
  Subjective:    Patient ID: Brandy [name], female    DOB: [birth year], 45 y.o.   MRN: [id number]  HPI CC: cold?  6d h/o cold sxs.  Started with ST, fatigue, then developed dry cough, feverish/chills, facial congestion, then developed throat pain.  Initially with ear pain as well.  Now having chest pain with coughing and deep breathing.  Cough worsening, becoming more productive (clear).  Feeling some nausea.  Some body aches.    So far has tried ibuprofen/excedrin.  Tried salt water gargles.  No abd pain, v/d, rashes, tooth pain.  Family getting sick now.  No smokers at home.  No h/o asthma, COPD.  + HTN.  Had flu shot this year.  Review of Systems Per HPI    Objective:   Physical Exam  Nursing note and vitals reviewed. Constitutional: She appears well-developed and well-nourished. No distress.  HENT:  Head: Normocephalic and atraumatic.  Right Ear: Hearing, external ear and ear canal normal.  Left Ear: External ear normal.  Nose: No mucosal edema or rhinorrhea.  Mouth/Throat: Uvula is midline, oropharynx is clear and moist and mucous membranes are normal. No oropharyngeal exudate, posterior oropharyngeal edema, posterior oropharyngeal erythema or tonsillar abscesses.       Congested behind R TM. L TM unable to be visualized 2/2 cerumen  Eyes: Conjunctivae and EOM are normal. Pupils are equal, round, and reactive to light. No scleral icterus.  Neck: Normal range of motion. Neck supple. No JVD present. No thyromegaly present.  Cardiovascular: Normal rate, regular rhythm, normal heart sounds and intact distal pulses.   No murmur heard. Pulmonary/Chest: Effort normal. No respiratory distress. She has wheezes (end exp) in the right upper field. She has no rales.  Lymphadenopathy:    She has no cervical adenopathy.  Skin: Skin is warm and dry. No rash noted.       Assessment & Plan:

## 2011-03-26 NOTE — Patient Instructions (Addendum)
I think you started with upper respiratory infection, likely viral , but may be developing bronchitis Take zpack as prescribed, use albuterol inhaler as prescribed for wheezing heard today. Update [place] if fever >101.5, worsening cough or other concerns. Push fluids and rest. For ears - may use OTC debrox.

## 2011-03-26 NOTE — Progress Notes (Signed)
Both Rx's called in as directed due to e-scribe error.

## 2012-05-29 ENCOUNTER — Encounter: Payer: Self-pay | Admitting: Family Medicine

## 2012-05-29 ENCOUNTER — Ambulatory Visit (INDEPENDENT_AMBULATORY_CARE_PROVIDER_SITE_OTHER): Payer: BC Managed Care – PPO | Admitting: Family Medicine

## 2012-05-29 VITALS — Ht 67.0 in

## 2012-05-29 DIAGNOSIS — J40 Bronchitis, not specified as acute or chronic: Secondary | ICD-10-CM

## 2012-05-30 NOTE — Progress Notes (Signed)
Cancelled.  

## 2012-09-24 ENCOUNTER — Telehealth: Payer: Self-pay | Admitting: Family Medicine

## 2012-09-24 NOTE — Telephone Encounter (Signed)
error 

## 2012-10-23 ENCOUNTER — Other Ambulatory Visit: Payer: Self-pay | Admitting: Family Medicine

## 2012-10-24 NOTE — Telephone Encounter (Signed)
Ok to refill 

## 2012-12-24 ENCOUNTER — Telehealth: Payer: Self-pay | Admitting: Adult Health

## 2012-12-24 NOTE — Telephone Encounter (Signed)
Dr. Ermalene Searing and Brandy [name] is it ok for this patient to switch from the [facility] office (Dr. Ermalene Searing) to [facility] (Brandy [name] Rey). She wants to be closer to her home in [place].

## 2012-12-24 NOTE — Telephone Encounter (Signed)
I see no reason she should not be allowed to switch, she is a very pleasant lady.

## 2013-01-02 NOTE — Telephone Encounter (Signed)
Left a message for the patient to call back to schedule.

## 2014-02-18 ENCOUNTER — Encounter: Payer: Self-pay | Admitting: Family Medicine

## 2014-02-18 ENCOUNTER — Ambulatory Visit (INDEPENDENT_AMBULATORY_CARE_PROVIDER_SITE_OTHER): Payer: BC Managed Care – PPO | Admitting: Family Medicine

## 2014-02-18 ENCOUNTER — Other Ambulatory Visit (HOSPITAL_COMMUNITY)
Admission: RE | Admit: 2014-02-18 | Discharge: 2014-02-18 | Disposition: A | Discharge status: Home / Self Care | Payer: BC Managed Care – PPO | Source: Ambulatory Visit | Attending: Family Medicine | Admitting: Family Medicine

## 2014-02-18 VITALS — BP 140/80 | HR 97 | Temp 98.4°F | Ht 67.0 in | Wt 368.2 lb

## 2014-02-18 DIAGNOSIS — E739 Lactose intolerance, unspecified: Secondary | ICD-10-CM

## 2014-02-18 DIAGNOSIS — Z01419 Encounter for gynecological examination (general) (routine) without abnormal findings: Secondary | ICD-10-CM | POA: Insufficient documentation

## 2014-02-18 DIAGNOSIS — Z1151 Encounter for screening for human papillomavirus (HPV): Secondary | ICD-10-CM | POA: Diagnosis present

## 2014-02-18 DIAGNOSIS — N915 Oligomenorrhea, unspecified: Secondary | ICD-10-CM

## 2014-02-18 DIAGNOSIS — Z23 Encounter for immunization: Secondary | ICD-10-CM

## 2014-02-18 DIAGNOSIS — Z0000 Encounter for general adult medical examination without abnormal findings: Secondary | ICD-10-CM

## 2014-02-18 DIAGNOSIS — R5382 Chronic fatigue, unspecified: Secondary | ICD-10-CM

## 2014-02-18 DIAGNOSIS — F321 Major depressive disorder, single episode, moderate: Secondary | ICD-10-CM

## 2014-02-18 DIAGNOSIS — E781 Pure hyperglyceridemia: Secondary | ICD-10-CM

## 2014-02-18 DIAGNOSIS — I10 Essential (primary) hypertension: Secondary | ICD-10-CM

## 2014-02-18 DIAGNOSIS — Z124 Encounter for screening for malignant neoplasm of cervix: Secondary | ICD-10-CM

## 2014-02-18 LAB — HEMOGLOBIN A1C: Hgb A1c MFr Bld: 6.2 % (ref 4.6–6.5)

## 2014-02-18 MED ORDER — VENLAFAXINE HCL ER 37.5 MG PO CP24: ORAL_CAPSULE | ORAL | Status: DC

## 2014-02-18 NOTE — Progress Notes (Signed)
Pre visit review using our clinic review tool, if applicable. No additional management support is needed unless otherwise documented below in the visit note. 

## 2014-02-18 NOTE — Progress Notes (Signed)
Subjective:    Patient ID: Brandy SiasLori J [name], female    DOB: 10/01/1965, 48 y.o.   MRN: [id number]  HPI The patient is here for annual wellness exam and preventative care.    Hypertension: BP Readings from Last 3 Encounters:  02/18/14 140/80  03/26/11 136/80  01/30/11 148/88  Using medication without problems or lightheadedness: None Chest pain with exertion:None Edema:None Short of breath:None Average home BPs: Other issues:  Hypertriglyceridemia : Due for re-eval. Lab Results  Component Value Date   CHOL 185 09/15/2009   HDL 47.00 09/15/2009   LDLCALC 101* 09/15/2009   LDLDIRECT 108.3 09/14/2010   TRIG 184.0* 09/15/2009   CHOLHDL 4 09/15/2009   Glucose intolerance: due for re-eval.  Wt Readings from Last 3 Encounters:  02/18/14 368 lb 4 oz (167.037 kg)  03/26/11 373 lb 8 oz (169.418 kg)  01/30/11 366 lb (166.017 kg)   She works at a desk job. Minimal activity. She has a lot of issues with mobility due to weight and deconditioning. Joint pain  In shoulders down arms and upper back. B knee pain and leg pain with walking.  Chronic low back pain with standing.  She feels likely her body is tender everywhere. She has been told she needs bariatric surgery but she is not interested.  Mother has RA and fibromyalgia.  She has been working on [employer] diet given husband diagnosed with DM recently. She gets no exercise. She is under a lot of stress at work and at home. She has issues with depression . Similar in past.  She has seen counselor in past. She is tearful in office today. No anhedonia. Trouble sleeping at night.  She has been having lighter menses skipping periods. ? Menopause. Mother went through menopause age 50.   Review of Systems  Constitutional: Negative for fever and fatigue.  HENT: Negative for ear pain.   Eyes: Negative for pain.  Respiratory: Negative for chest tightness and shortness of breath.   Cardiovascular: Negative for chest pain, palpitations and  leg swelling.  Gastrointestinal: Negative for abdominal pain.  Genitourinary: Negative for dysuria.       Objective:   Physical Exam  Constitutional: Vital signs are normal. She appears well-developed and well-nourished. She is cooperative.  Non-toxic appearance. She does not appear ill. No distress.  HENT:  Head: Normocephalic.  Right Ear: Hearing, tympanic membrane, external ear and ear canal normal.  Left Ear: Hearing, tympanic membrane, external ear and ear canal normal.  Nose: Nose normal.  Eyes: Conjunctivae, EOM and lids are normal. Pupils are equal, round, and reactive to light. Lids are everted and swept, no foreign bodies found.  Neck: Trachea normal and normal range of motion. Neck supple. Carotid bruit is not present. No thyroid mass and no thyromegaly present.  Cardiovascular: Normal rate, regular rhythm, S1 normal, S2 normal, normal heart sounds and intact distal pulses.  Exam reveals no gallop.   No murmur heard. Pulmonary/Chest: Effort normal and breath sounds normal. No respiratory distress. She has no wheezes. She has no rhonchi. She has no rales.  Abdominal: Soft. Normal appearance and bowel sounds are normal. She exhibits no distension, no fluid wave, no abdominal bruit and no mass. There is no hepatosplenomegaly. There is no tenderness. There is no rebound, no guarding and no CVA tenderness. No hernia.  Genitourinary: Vagina normal and uterus normal. No breast swelling, tenderness, discharge or bleeding. Pelvic exam was performed with patient supine. There is no rash, tenderness or lesion on the right  labia. There is no rash, tenderness or lesion on the left labia. Uterus is not enlarged and not tender. Cervix exhibits no motion tenderness, no discharge and no friability. Right adnexum displays no mass, no tenderness and no fullness. Left adnexum displays no mass, no tenderness and no fullness.  Lymphadenopathy:    She has no cervical adenopathy.    She has no axillary  adenopathy.  Neurological: She is alert. She has normal strength. No cranial nerve deficit or sensory deficit.  Skin: Skin is warm, dry and intact. No rash noted.  Psychiatric: Her speech is normal and behavior is normal. Judgment normal. Her mood appears not anxious. Cognition and memory are normal. She does not exhibit a depressed mood.          Assessment & Plan:  The patient's preventative maintenance and recommended screening tests for an annual wellness exam were reviewed in full today. Brought up to date unless services declined.  Counselled on the importance of diet, exercise, and its role in overall health and mortality. The patient's FH and SH was reviewed, including their home life, tobacco status, and drug and alcohol status.   Vaccine:  Td uptodate 2011, due flu Mammogram: due PAP/ DVE: Due , has not had in 3 years. Colon: refuses at this time. No family history of colon cancer.

## 2014-02-18 NOTE — Patient Instructions (Addendum)
Stop at lab on way out. Water exercise if able. Work on low [place] diet. Consider weight watchers. Start venlafaxine 37.5 mg daily , increase to 75 mg daily after 1 week if tolerating. Follow up in 1 month 30 min OV. Call to schedule mammogram on own.

## 2014-02-19 ENCOUNTER — Telehealth: Payer: Self-pay | Admitting: Family Medicine

## 2014-02-19 ENCOUNTER — Telehealth: Payer: Self-pay | Admitting: *Deleted

## 2014-02-19 ENCOUNTER — Telehealth: Payer: Self-pay

## 2014-02-19 LAB — TSH: TSH: 1.82 u[IU]/mL (ref 0.35–4.50)

## 2014-02-19 LAB — COMPREHENSIVE METABOLIC PANEL
ALK PHOS: 80 U/L (ref 39–117)
ALT: 16 U/L (ref 0–35)
AST: 22 U/L (ref 0–37)
Albumin: 3.4 g/dL — ABNORMAL LOW (ref 3.5–5.2)
BILIRUBIN TOTAL: 0.7 mg/dL (ref 0.2–1.2)
BUN: 10 mg/dL (ref 6–23)
CO2: 25 mEq/L (ref 19–32)
Calcium: 8.7 mg/dL (ref 8.4–10.5)
Chloride: 101 mEq/L (ref 96–112)
Creatinine, Ser: 0.8 mg/dL (ref 0.4–1.2)
GFR: 79.35 mL/min (ref >60.00)
GLUCOSE: 112 mg/dL — AB (ref 70–99)
Potassium: 4.3 mEq/L (ref 3.5–5.1)
SODIUM: 135 meq/L (ref 135–145)
TOTAL PROTEIN: 7.9 g/dL (ref 6.0–8.3)

## 2014-02-19 LAB — CYTOLOGY - PAP

## 2014-02-19 LAB — VITAMIN D 25 HYDROXY (VIT D DEFICIENCY, FRACTURES): VITD: 10.2 ng/mL — ABNORMAL LOW (ref 30.00–100.00)

## 2014-02-19 LAB — LIPID PANEL
CHOL/HDL RATIO: 3
Cholesterol: 201 mg/dL — ABNORMAL HIGH (ref 0–200)
HDL: 59.6 mg/dL (ref >39.00)
LDL Cholesterol: 113 mg/dL — ABNORMAL HIGH (ref 0–99)
NonHDL: 141.4
TRIGLYCERIDES: 143 mg/dL (ref 0.0–149.0)
VLDL: 28.6 mg/dL (ref 0.0–40.0)

## 2014-02-19 LAB — LUTEINIZING HORMONE: LH: 13.94 m[IU]/mL

## 2014-02-19 LAB — FOLLICLE STIMULATING HORMONE: FSH: 12.7 m[IU]/mL

## 2014-02-19 MED ORDER — VENLAFAXINE HCL ER 75 MG PO CP24: 75.0000 mg | ORAL_CAPSULE | Freq: Every day | ORAL | Status: DC

## 2014-02-19 MED ORDER — VENLAFAXINE HCL ER 37.5 MG PO CP24: 37.5000 mg | ORAL_CAPSULE | Freq: Every day | ORAL | Status: DC

## 2014-02-19 MED ORDER — VITAMIN D (ERGOCALCIFEROL) 1.25 MG (50000 UNIT) PO CAPS: 50000.0000 [IU] | ORAL_CAPSULE | ORAL | Status: DC

## 2014-02-19 NOTE — Telephone Encounter (Signed)
emmi emailed °

## 2014-02-19 NOTE — Telephone Encounter (Signed)
Erisa notified as instructed by telephone. Prescription for Vit D sent to [facility] [address] [address] [place].

## 2014-02-19 NOTE — Telephone Encounter (Signed)
Pt left v/m; [facility] [address] Has sent fax; ins rejected venlafaxine as prescribed; Ins will only pay for 1 per day. [facility]; pt request cb.

## 2014-02-19 NOTE — Telephone Encounter (Signed)
Lonita notified prescriptions have been sent to her pharmacy.  She will call me back if she feels she needs to stay on the 37.5 mg dose longer than 7 days.  Lab results given.  Advised Dr. Ermalene SearingBedsole has not reviewed her results yet and I would call her back with any recommendation from Dr. Ermalene SearingBedsole once she reviews her labs.

## 2014-02-19 NOTE — Telephone Encounter (Signed)
Message copied by Damita LackLORING, DONNA S on Fri Feb 19, 2014  5:27 PM ------      Message from: [place], VirginiaMY E      Created: Fri Feb 19, 2014  5:26 PM       Nml thyroid,  No clear sign of menopause.       Vit D is very low: recommend supplement. Call in vit D  50,000 units once weekly x 12 week # 12  0RF       Blood sugar is in prediabetes range. LDL chol at goal < 130,  Trig well controlled.       Work on [employer]healthy eating , exercise and weight loss. ------

## 2014-03-15 DIAGNOSIS — R5382 Chronic fatigue, unspecified: Secondary | ICD-10-CM | POA: Insufficient documentation

## 2014-03-15 DIAGNOSIS — N915 Oligomenorrhea, unspecified: Secondary | ICD-10-CM | POA: Insufficient documentation

## 2014-03-15 NOTE — Assessment & Plan Note (Signed)
Start venlafaxine 37.5 mg daily , increase to 75 mg daily after 1 week if tolerating. Follow up in 1 month 30 min OV.

## 2014-03-15 NOTE — Assessment & Plan Note (Signed)
Well controlled. Continue current medication.  

## 2014-03-15 NOTE — Assessment & Plan Note (Signed)
Eval with labs. 

## 2014-03-15 NOTE — Assessment & Plan Note (Signed)
Water exercise if able. Work on low [place] diet. Consider weight watchers.

## 2014-03-26 ENCOUNTER — Other Ambulatory Visit: Payer: Self-pay | Admitting: Family Medicine

## 2014-03-27 NOTE — Telephone Encounter (Signed)
Last office visit 02/18/2014.  Last refilled 02/19/2014 for #12 with no refills.  Refill?

## 2014-03-28 ENCOUNTER — Telehealth: Payer: Self-pay | Admitting: Family Medicine

## 2014-03-28 NOTE — Telephone Encounter (Signed)
Now that pt has completed 12 weeks of vit D she neds to take OTC vit D 400 IU twice daily

## 2014-03-29 NOTE — Telephone Encounter (Signed)
Elois notified as instructed by telephone.  She will start the OTC Vit D 400 IU twice daily once she finishes the 12 week therapy of Vit D 50000 units.

## 2014-04-06 ENCOUNTER — Encounter: Payer: Self-pay | Admitting: Family Medicine

## 2014-04-06 ENCOUNTER — Ambulatory Visit (INDEPENDENT_AMBULATORY_CARE_PROVIDER_SITE_OTHER): Payer: BC Managed Care – PPO | Admitting: Family Medicine

## 2014-04-06 VITALS — BP 140/86 | HR 90 | Temp 98.1°F | Ht 67.0 in | Wt 381.5 lb

## 2014-04-06 DIAGNOSIS — R5382 Chronic fatigue, unspecified: Secondary | ICD-10-CM

## 2014-04-06 DIAGNOSIS — M25531 Pain in right wrist: Secondary | ICD-10-CM | POA: Insufficient documentation

## 2014-04-06 DIAGNOSIS — M25532 Pain in left wrist: Secondary | ICD-10-CM

## 2014-04-06 DIAGNOSIS — M79641 Pain in right hand: Secondary | ICD-10-CM | POA: Insufficient documentation

## 2014-04-06 DIAGNOSIS — F321 Major depressive disorder, single episode, moderate: Secondary | ICD-10-CM

## 2014-04-06 DIAGNOSIS — M79642 Pain in left hand: Secondary | ICD-10-CM

## 2014-04-06 LAB — RHEUMATOID FACTOR

## 2014-04-06 LAB — SEDIMENTATION RATE: Sed Rate: 43 mm/hr — ABNORMAL HIGH (ref 0–22)

## 2014-04-06 NOTE — Assessment & Plan Note (Signed)
Improved some with vit D supplementation. Encouraged increased exercise as a lot of symptoms are likely from deconditioning and poor fit state.

## 2014-04-06 NOTE — Assessment & Plan Note (Signed)
Pt currently not interested in medicaiton. Never tried venlafaxine, may do so soon. Discussed med in detail again today.

## 2014-04-06 NOTE — Progress Notes (Signed)
   Subjective:    Patient ID: Brandy SiasLori J [name], female    DOB: 04/07/1966, 48 y.o.   MRN: [id number]  HPI  48 year old female presents for 1 month follow up on depression.  Started venlafaxine at that time.  Since that time she reports that she was unable to tolerate this.[place]. Took for 3 days. Had difficulty  With nausea. She is not interested in treating with a different medication at this time. No SI, no HI.  Started on vit D for fatigue and vit D def.  She still is very tired over all.  She has been working somewhat on diet changes, but hard over the holidays. No exercise , but she has been trying to move more.    Wt Readings from Last 3 Encounters:  04/06/14 381 lb 8 oz (173.047 kg)  02/18/14 368 lb 4 oz (167.037 kg)  03/26/11 373 lb 8 oz (169.418 kg)   She has pain in Bilateral wrist and hand pain months to years, more annoying in last several months. He mother has RA. She has not noted swelling or redness in hands, fingers. No numbness or tingling, no neck pain. She does have shoulder pain and lower ext pain from obesity.  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      Review of Systems  Constitutional: Negative for fever and fatigue.  HENT: Negative for ear pain.   Eyes: Negative for pain.  Respiratory: Negative for chest tightness and shortness of breath.   Cardiovascular: Negative for chest pain, palpitations and leg swelling.  Gastrointestinal: Negative for abdominal pain.  Genitourinary: Negative for dysuria.       Objective:   Physical Exam  Constitutional: Vital signs are normal. She appears well-developed and well-nourished. She is cooperative.  Non-toxic appearance. She does not appear ill. No distress.  HENT:  Head: Normocephalic.  Right Ear: Hearing, tympanic membrane, external ear and ear canal normal. Tympanic membrane is not erythematous, not retracted and not bulging.  Left Ear: Hearing, tympanic membrane, external ear and ear canal normal. Tympanic  membrane is not erythematous, not retracted and not bulging.  Nose: No mucosal edema or rhinorrhea. Right sinus exhibits no maxillary sinus tenderness and no frontal sinus tenderness. Left sinus exhibits no maxillary sinus tenderness and no frontal sinus tenderness.  Mouth/Throat: Uvula is midline, oropharynx is clear and moist and mucous membranes are normal.  Eyes: Conjunctivae, EOM and lids are normal. Pupils are equal, round, and reactive to light. Lids are everted and swept, no foreign bodies found.  Neck: Trachea normal and normal range of motion. Neck supple. Carotid bruit is not present. No thyroid mass and no thyromegaly present.  Cardiovascular: Normal rate, regular rhythm, S1 normal, S2 normal, normal heart sounds, intact distal pulses and normal pulses.  Exam reveals no gallop and no friction rub.   No murmur heard. Pulmonary/Chest: Effort normal and breath sounds normal. No tachypnea. No respiratory distress. She has no decreased breath sounds. She has no wheezes. She has no rhonchi. She has no rales.  Abdominal: Soft. Normal appearance and bowel sounds are normal. There is no tenderness.  Neurological: She is alert.  Skin: Skin is warm, dry and intact. No rash noted.  Psychiatric: Her speech is normal and behavior is normal. Judgment and thought content normal. Her mood appears not anxious. Cognition and memory are normal. She does not exhibit a depressed mood.          Assessment & Plan:

## 2014-04-06 NOTE — Progress Notes (Signed)
Pre visit review using our clinic review tool, if applicable. No additional management support is needed unless otherwise documented below in the visit note. 

## 2014-04-06 NOTE — Assessment & Plan Note (Signed)
Given family history of RA.. Will eval with labs. Pain is in small joints and symmetric  well.  Exam today is normal though and not supportive of RA.

## 2014-04-06 NOTE — Patient Instructions (Addendum)
Stop at lab on way out. Call if interested referral for counselor, Evalina FieldJane Perrin  Follow up in 1 year for CPX with labs prior.

## 2014-04-07 LAB — CYCLIC CITRUL PEPTIDE ANTIBODY, IGG

## 2015-03-09 NOTE — Progress Notes (Signed)
This encounter was created in error - please disregard.

## 2016-05-10 ENCOUNTER — Telehealth: Payer: Self-pay | Admitting: *Deleted

## 2016-05-10 NOTE — Telephone Encounter (Signed)
Fine with me

## 2016-05-10 NOTE — Telephone Encounter (Signed)
Error

## 2016-05-10 NOTE — Telephone Encounter (Signed)
PT called in asking about changing providers. She would like to come see Nicki Reaperegina Baity. Please let me know if you are both okay with this.

## 2016-05-10 NOTE — Telephone Encounter (Signed)
Have not seen in 2 years, no issue with changing.

## 2018-02-19 ENCOUNTER — Ambulatory Visit: Payer: Self-pay | Admitting: *Deleted

## 2018-02-19 NOTE — Telephone Encounter (Signed)
Pt reports sore throat, sinus tenderness x 2-3 days. States glands on left side of neck swollen. Has not checked temp but reports "I feel hot, then chilled." Reports headache yesterday "Behind my eyes " but states it has subsided today.Denies SOB, earache, cough. Also reports decreased energy, appetite. States is staying hydrated. States "Can't stay focused, lethargic", x 2 weeks. Has been taking DayQuil and tylenol sinus, Ibuprofen; minimal effectiveness.  Pt has establish care appt with K. Chestine Spore 03/06/18. Acute same day appt made for tomorrow with R. Baity. Care advise given per protocol.  Reason for Disposition . [1] Pus on tonsils (back of throat) AND [2]  fever AND [3] swollen neck lymph nodes ("glands")  Answer Assessment - Initial Assessment Questions 1. ONSET: "When did the throat start hurting?" (Hours or days ago)      2-3 days ago 2. SEVERITY: "How bad is the sore throat?" (Scale 1-10; mild, moderate or severe)   - MILD (1-3):  doesn't interfere with eating or normal activities   - MODERATE (4-7): interferes with eating some solids and normal activities   - SEVERE (8-10):  excruciating pain, interferes with most normal activities   - SEVERE DYSPHAGIA: can't swallow liquids, drooling     7/10 3. STREP EXPOSURE: "Has there been any exposure to strep within the past week?" If so, ask: "What type of contact occurred?"      no 4.  VIRAL SYMPTOMS: "Are there any symptoms of a cold, such as a runny nose, cough, hoarse voice or red eyes?"      Sinus tenderness 5. FEVER: "Do you have a fever?" If so, ask: "What is your temperature, how was it measured, and when did it start?"     Feels hot, chills at times, does have have thermometer. 6. PUS ON THE TONSILS: "Is there pus on the tonsils in the back of your throat?"     Mucous not pus; large amounts, clear, thick 7. OTHER SYMPTOMS: "Do you have any other symptoms?" (e.g., difficulty breathing, headache, rash)    Sinus tenderness, glands swollen  left side of neck, headache behind eyes  Protocols used: SORE THROAT-A-AH

## 2018-02-20 ENCOUNTER — Ambulatory Visit (INDEPENDENT_AMBULATORY_CARE_PROVIDER_SITE_OTHER): Payer: BLUE CROSS/BLUE SHIELD | Admitting: Internal Medicine

## 2018-02-20 ENCOUNTER — Encounter: Payer: Self-pay | Admitting: Internal Medicine

## 2018-02-20 VITALS — BP 132/84 | HR 83 | Temp 98.3°F | Wt 399.0 lb

## 2018-02-20 DIAGNOSIS — R7309 Other abnormal glucose: Secondary | ICD-10-CM

## 2018-02-20 DIAGNOSIS — B9789 Other viral agents as the cause of diseases classified elsewhere: Secondary | ICD-10-CM

## 2018-02-20 DIAGNOSIS — R739 Hyperglycemia, unspecified: Secondary | ICD-10-CM

## 2018-02-20 DIAGNOSIS — J329 Chronic sinusitis, unspecified: Secondary | ICD-10-CM

## 2018-02-20 LAB — POCT GLYCOSYLATED HEMOGLOBIN (HGB A1C): Hemoglobin A1C: 11.3 % — AB (ref 4.0–5.6)

## 2018-02-20 MED ORDER — PREDNISONE 10 MG PO TABS: ORAL_TABLET | ORAL | 0 refills | Status: DC

## 2018-02-20 MED ORDER — CETIRIZINE HCL 10 MG PO TABS: 10.0000 mg | ORAL_TABLET | Freq: Every day | ORAL | 11 refills | Status: DC

## 2018-02-20 NOTE — Telephone Encounter (Signed)
Pt has appt with R Baity NP 02/20/18 at 4:15.

## 2018-02-21 ENCOUNTER — Encounter: Payer: Self-pay | Admitting: Internal Medicine

## 2018-02-21 NOTE — Progress Notes (Signed)
HPI  Pt presents to the clinic today with c/o headache, facial pain and pressure, nasal congestion, post nasal drip and sore throat. She reports this started 3 days ago. She describes the headache as pressure in the forehead. She is not able to blow anything out of her nose. She denies difficulty swallowing. She denies ear pain, runny nose, cough. She denies fever, chills or body aches. She has tried Dayquil, Tylenol and Ibuprofen with some relief. She has no history of allergies. She has not had sick contacts that she is aware of.  Of note, she reports she cut her finger while chopping vegetables. She checked her blood sugar and it was 360. She reports history of prediabetes.  Review of Systems     Past Medical History:  Diagnosis Date  . Depression   . Dysmetabolic syndrome X   . Hypertension   . Hypertriglyceridemia   . Obesity   . Tetanus vaccine causing adverse effect in therapeutic use     Family History  Problem Relation Age of Onset  . Rheum arthritis Mother   . COPD Mother   . Hypertension Father   . Heart disease Maternal Grandmother   . Stroke Maternal Grandmother   . Alcohol abuse Maternal Grandmother   . Alcohol abuse Maternal Grandfather   . Aneurysm Paternal Grandmother   . Autism Son        severe    Social History   Socioeconomic History  . Marital status: Married    Spouse name: Not on file  . Number of children: 3  . Years of education: Not on file  . Highest education level: Not on file  Occupational History  . Occupation: Government social research officer: [employer]  Social Needs  . Financial resource strain: Not on file  . Food insecurity:    Worry: Not on file    Inability: Not on file  . Transportation needs:    Medical: Not on file    Non-medical: Not on file  Tobacco Use  . Smoking status: Never Smoker  . Smokeless tobacco: Never Used  Substance and Sexual Activity  . Alcohol use: Yes    Comment: occasional  . Drug use: No  . Sexual  activity: Not on file  Lifestyle  . Physical activity:    Days per week: Not on file    Minutes per session: Not on file  . Stress: Not on file  Relationships  . Social connections:    Talks on phone: Not on file    Gets together: Not on file    Attends religious service: Not on file    Active member of club or organization: Not on file    Attends meetings of clubs or organizations: Not on file    Relationship status: Not on file  . Intimate partner violence:    Fear of current or ex partner: Not on file    Emotionally abused: Not on file    Physically abused: Not on file    Forced sexual activity: Not on file  Other Topics Concern  . Not on file  Social History Narrative   Best boy      Married; 3 kids (middle child-severely autistic)      No regular exercise    Allergies  Allergen Reactions  . Diphenhydramine Hcl     REACTION: minor rash  . Penicillins     REACTION: reaction as a child     Constitutional: Positive headache. Denies fatigue, fever  or abrupt weight changes.  HEENT:  Positive facial pain, nasal congestion and sore throat. Denies eye redness, ear pain, ringing in the ears, wax buildup, runny nose or bloody nose. Respiratory: Denies cough, difficulty breathing or shortness of breath.  Cardiovascular: Denies chest pain, chest tightness, palpitations or swelling in the hands or feet.   No other specific complaints in a complete review of systems (except as listed in HPI above).  Objective:   BP 132/84   Pulse 83   Temp 98.3 F (36.8 C) (Oral)   Wt (!) 399 lb (181 kg)   SpO2 98%   BMI 62.49 kg/m   General: Appears her stated age, obese, in NAD. HEENT: Head: normal shape and size, frontal sinus tenderness noted; Ears: Tm's gray and intact, normal light reflex; Nose: mucosa boggy and moist, septum midline; Throat/Mouth: + PND. Teeth present, mucosa erythematous and moist, no exudate noted, no lesions or ulcerations noted.  Neck:  No  adenopathy noted.  Cardiovascular: Normal rate and rhythm.  Pulmonary/Chest: Normal effort and positive vesicular breath sounds. No respiratory distress. No wheezes, rales or ronchi noted.  Neuro: Alert and oriented.     Assessment & Plan:   Viral Sinusitis  Can use a Neti Pot which can be purchased from your local drug store. Advised her to start Zyrtec OTC Advised her to start Flonase OTC- she adamantly refuses to use a nasal spray eRx for Pred Taper x 6 days  Hyperglycemia:  A1C today  RTC as needed or if symptoms persist. Nicki Reaper, NP

## 2018-02-21 NOTE — Patient Instructions (Signed)

## 2018-02-24 ENCOUNTER — Encounter: Payer: Self-pay | Admitting: Primary Care

## 2018-02-24 ENCOUNTER — Ambulatory Visit (INDEPENDENT_AMBULATORY_CARE_PROVIDER_SITE_OTHER): Payer: BLUE CROSS/BLUE SHIELD | Admitting: Primary Care

## 2018-02-24 VITALS — BP 144/86 | HR 105 | Temp 98.0°F | Ht 67.0 in | Wt 398.5 lb

## 2018-02-24 DIAGNOSIS — E559 Vitamin D deficiency, unspecified: Secondary | ICD-10-CM | POA: Diagnosis not present

## 2018-02-24 DIAGNOSIS — F419 Anxiety disorder, unspecified: Secondary | ICD-10-CM

## 2018-02-24 DIAGNOSIS — E119 Type 2 diabetes mellitus without complications: Secondary | ICD-10-CM

## 2018-02-24 DIAGNOSIS — R5382 Chronic fatigue, unspecified: Secondary | ICD-10-CM

## 2018-02-24 DIAGNOSIS — R5383 Other fatigue: Secondary | ICD-10-CM

## 2018-02-24 DIAGNOSIS — I10 Essential (primary) hypertension: Secondary | ICD-10-CM | POA: Diagnosis not present

## 2018-02-24 DIAGNOSIS — E781 Pure hyperglyceridemia: Secondary | ICD-10-CM

## 2018-02-24 DIAGNOSIS — F411 Generalized anxiety disorder: Secondary | ICD-10-CM | POA: Insufficient documentation

## 2018-02-24 LAB — COMPREHENSIVE METABOLIC PANEL
ALBUMIN: 4 g/dL (ref 3.5–5.2)
ALT: 28 U/L (ref 0–35)
AST: 25 U/L (ref 0–37)
Alkaline Phosphatase: 108 U/L (ref 39–117)
BILIRUBIN TOTAL: 0.5 mg/dL (ref 0.2–1.2)
BUN: 14 mg/dL (ref 6–23)
CALCIUM: 8.9 mg/dL (ref 8.4–10.5)
CO2: 26 mEq/L (ref 19–32)
Chloride: 98 mEq/L (ref 96–112)
Creatinine, Ser: 0.79 mg/dL (ref 0.40–1.20)
GFR: 80.41 mL/min (ref >60.00)
Glucose, Bld: 314 mg/dL — ABNORMAL HIGH (ref 70–99)
Potassium: 4 mEq/L (ref 3.5–5.1)
SODIUM: 135 meq/L (ref 135–145)
TOTAL PROTEIN: 7.5 g/dL (ref 6.0–8.3)

## 2018-02-24 LAB — LIPID PANEL
CHOLESTEROL: 208 mg/dL — AB (ref 0–200)
HDL: 62.9 mg/dL (ref >39.00)
LDL CALC: 112 mg/dL — AB (ref 0–99)
NonHDL: 145.14
TRIGLYCERIDES: 165 mg/dL — AB (ref 0.0–149.0)
Total CHOL/HDL Ratio: 3
VLDL: 33 mg/dL (ref 0.0–40.0)

## 2018-02-24 LAB — TSH: TSH: 4.73 u[IU]/mL — ABNORMAL HIGH (ref 0.35–4.50)

## 2018-02-24 LAB — VITAMIN D 25 HYDROXY (VIT D DEFICIENCY, FRACTURES): VITD: 12.29 ng/mL — AB (ref 30.00–100.00)

## 2018-02-24 LAB — VITAMIN B12: VITAMIN B 12: 211 pg/mL (ref 211–911)

## 2018-02-24 MED ORDER — METFORMIN HCL ER 500 MG PO TB24: 1000.0000 mg | ORAL_TABLET | Freq: Every day | ORAL | 1 refills | Status: DC

## 2018-02-24 MED ORDER — BLOOD GLUCOSE MONITOR KIT: PACK | 0 refills | Status: DC

## 2018-02-24 MED ORDER — GLIPIZIDE ER 5 MG PO TB24: 5.0000 mg | ORAL_TABLET | Freq: Every day | ORAL | 1 refills | Status: DC

## 2018-02-24 MED ORDER — LISINOPRIL 20 MG PO TABS: 20.0000 mg | ORAL_TABLET | Freq: Every day | ORAL | 3 refills | Status: DC

## 2018-02-24 NOTE — Progress Notes (Signed)
Subjective:    Patient ID: Brandy [name], female    DOB: 02/08/1966, 52 y.o.   MRN: [id number]  HPI  Mr. Brissette is a 52 year old female who presents today to establish care. She was seen for an acute visit on 02/20/18 by Nicki Reaper, symptoms of headache, facial pain, sore throat x 3 days. She also mentioned that she cut her finger while preparing vegetables, checked her glucose which was 390. She has a history of prediabetes, her A1C that day was 11.3.  She admits that she's had a lot of fatigue, some dizziness, headaches, blurry vision, numbness to her feet. She is scheduled to see her eye doctor this week.    1) Type 2 Diabetes:  Current medications include: None, new diagnosis.   She has not checked her glucose since last week. She was once prescribed Metformin for prediabetes through [facility] outpatient study. She could not tolerate due to diarrhea.   Last A1C: 11.3 last week  Last Eye Exam: Scheduled for this week.  Pneumonia Vaccination: Next visit  ACE/ARB: None  Statin: None  Diet currently consists of:  Breakfast: Eggs, fast food Lunch: Left overs, salad, soup, some fast food/restaurants  Dinner: Meat, vegetables, starch  Snacks: Sweets, popcorn Desserts: Cake, candy. Daily  Beverages: Un-sweet tea, water, recently started soda.   Exercise: She is not exercising. Sedentary at work.   2) Essential Hypertension: Currently not taking medications. She under a lot of stress with work, she has an adult son with autism. She has taken off from work over the last one week, has noticed some improvement. She has noticed blurry vision, facial flushing. She's been treated with lisinopril-HCTZ 10-12.5 mg years ago. She checks her BP infrequently and states that it's always "high".    BP Readings from Last 3 Encounters:  02/24/18 (!) 144/86  02/20/18 132/84  04/06/14 140/86    3) Hyperlipidemia: Prior history. Last lipid panel on file from 2015 with LDL of 113, TC of 201, HDL  of 59. She is fasting today.   4) Vitamin D Deficiency: History of with last level in 2015 of 10. She does not take vitamin D or calcium.   5) Anxiety/Stress: Chronic. She works in a high stress job, also has an adult son with autism who is non verbal. She's been under a tremendous amount of stress recently so she took off all last week from work. She is scheduled to return to work this week but when she went to sign back up she started to panic. She is requesting short term disability or FMLA for high stress. She understands that her health is declining and needs time at home, away from her high stress job, in order to get her health back in line.    Review of Systems  Constitutional: Negative for unexpected weight change.  Eyes: Positive for visual disturbance.  Respiratory: Negative for shortness of breath.   Cardiovascular: Negative for chest pain.  Gastrointestinal: Negative for nausea.  Endocrine: Positive for polyuria.  Genitourinary: Negative for difficulty urinating.  Musculoskeletal: Positive for arthralgias.  Neurological: Positive for numbness.  Psychiatric/Behavioral: Positive for sleep disturbance. The patient is nervous/anxious.        See HPI       Past Medical History:  Diagnosis Date  . Depression   . Dysmetabolic syndrome X   . Hypertension   . Hypertriglyceridemia   . Obesity   . Tetanus vaccine causing adverse effect in therapeutic use  Social History   Socioeconomic History  . Marital status: Married    Spouse name: Not on file  . Number of children: 3  . Years of education: Not on file  . Highest education level: Not on file  Occupational History  . Occupation: Government social research officer: [employer]  Social Needs  . Financial resource strain: Not on file  . Food insecurity:    Worry: Not on file    Inability: Not on file  . Transportation needs:    Medical: Not on file    Non-medical: Not on file  Tobacco Use  . Smoking status: Never  Smoker  . Smokeless tobacco: Never Used  Substance and Sexual Activity  . Alcohol use: Yes    Comment: occasional  . Drug use: No  . Sexual activity: Not on file  Lifestyle  . Physical activity:    Days per week: Not on file    Minutes per session: Not on file  . Stress: Not on file  Relationships  . Social connections:    Talks on phone: Not on file    Gets together: Not on file    Attends religious service: Not on file    Active member of club or organization: Not on file    Attends meetings of clubs or organizations: Not on file    Relationship status: Not on file  . Intimate partner violence:    Fear of current or ex partner: Not on file    Emotionally abused: Not on file    Physically abused: Not on file    Forced sexual activity: Not on file  Other Topics Concern  . Not on file  Social History Narrative   Best boy      Married; 3 kids (middle child-severely autistic)      No regular exercise    Past Surgical History:  Procedure Laterality Date  . 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 Rheum arthritis Mother   . COPD Mother   . Hypertension Father   . Heart disease Maternal Grandmother   . Stroke Maternal Grandmother   . Alcohol abuse Maternal Grandmother   . Alcohol abuse Maternal Grandfather   . Aneurysm Paternal Grandmother   . Autism Son        severe    Allergies  Allergen Reactions  . Diphenhydramine Hcl     REACTION: minor rash  . Penicillins     REACTION: reaction as a child    Current Outpatient Medications on File Prior to Visit  Medication Sig Dispense Refill  . cetirizine (ZYRTEC) 10 MG tablet Take 1 tablet (10 mg total) by mouth daily. 30 tablet 11  . predniSONE (DELTASONE) 10 MG tablet Take 3 tabs on days 1-2, take 2 tabs on days 3-4, take 1 tab on days 5-6 12 tablet 0   No current facility-administered medications on file prior to visit.     BP (!) 144/86   Pulse (!)  105   Temp 98 F (36.7 C) (Oral)   Ht 5\' 7"  (1.702 m)   Wt (!) 398 lb 8 oz (180.8 kg)   LMP 05/20/2017   SpO2 97%   BMI 62.41 kg/m    Objective:   Physical Exam  Constitutional: She is oriented to person, place, and time. She appears well-nourished.  Neck: Neck supple.  Cardiovascular: Normal rate and regular rhythm.  Respiratory: Effort  normal and breath sounds normal.  GI: Soft.  Musculoskeletal:  Ambulates slowly, steadily, without assistive device.   Neurological: She is oriented to person, place, and time.  Skin: Skin is warm and dry.  Psychiatric:  Seems anxious           Assessment & Plan:

## 2018-02-24 NOTE — Assessment & Plan Note (Signed)
Uncontrolled today, also on prior visits.  Given new diagnosis of diabetes, will start lisinopril 20 mg daily. We will see her back in 3 weeks for BP check and BMP.

## 2018-02-24 NOTE — Assessment & Plan Note (Addendum)
Increased stress at work, also in home life. Agree that a leave of absence from work is reasonable given uncontrolled diabetes and blood pressure. She will need close follow up in our office for at least 6 weeks. She would benefit from 6 weeks of leave at this point.   Due to other pressing issues we didn't get to discuss much more regarding anxiety and potential treatment. Will discuss at upcoming visits.   She will contact HR for paperwork for short term disability or FMLA.

## 2018-02-24 NOTE — Assessment & Plan Note (Signed)
Last documented level of 10 in 2015. Repeat Vitamin D pending. She is not taking supplemental vitamin D.

## 2018-02-24 NOTE — Assessment & Plan Note (Signed)
Repeat lipid panel pending. Will likely initiate statin therapy given newly diagnosed diabetes.

## 2018-02-24 NOTE — Patient Instructions (Signed)
Start lisinopril 20 mg tablets for blood pressure and kidney protection. Take 1 tablet by mouth once daily.  Start metformin ER 500 mg tablets for diabetes. Start by taking 1 tablet every morning with food for 2 weeks, then increase to 2 tablets every morning with food.  Start glipizide ER 5 mg tablets for diabetes. Take 1 tablet with breakfast for diabetes.   Stop by the lab prior to leaving today. I will notify you of your results once received.   Start checking your glucose 2 times daily, rotating times to check.  Before any meal, breakfast/lunch/dinner 2 hours after any meal, breakfast/lunch/dinner Bedtime   Record your glucose readings and bring them to your next visit.  Schedule a follow up visit for 3 weeks for blood pressure and glucose check.  It was a pleasure to meet you today! Please don't hesitate to call or message me with any questions. Welcome to [facility]!   Diabetes Mellitus and Nutrition When you have diabetes (diabetes mellitus), it is very important to have healthy eating habits because your blood sugar (glucose) levels are greatly affected by what you eat and drink. Eating healthy foods in the appropriate amounts, at about the same times every day, can help you:  Control your blood glucose.  Lower your risk of heart disease.  Improve your blood pressure.  Reach or maintain a healthy weight.  Every person with diabetes is different, and each person has different needs for a meal plan. Your health care provider may recommend that you work with a diet and nutrition specialist (dietitian) to make a meal plan that is best for you. Your meal plan may vary depending on factors such as:  The calories you need.  The medicines you take.  Your weight.  Your blood glucose, blood pressure, and cholesterol levels.  Your activity level.  Other health conditions you have, such as heart or kidney disease.  How do carbohydrates affect me? Carbohydrates affect your  blood glucose level more than any other type of food. Eating carbohydrates naturally increases the amount of glucose in your blood. Carbohydrate counting is a method for keeping track of how many carbohydrates you eat. Counting carbohydrates is important to keep your blood glucose at a healthy level, especially if you use insulin or take certain oral diabetes medicines. It is important to know how many carbohydrates you can safely have in each meal. This is different for every person. Your dietitian can help you calculate how many carbohydrates you should have at each meal and for snack. Foods that contain carbohydrates include:  Bread, cereal, rice, pasta, and crackers.  Potatoes and corn.  Peas, beans, and lentils.  Milk and yogurt.  Fruit and juice.  Desserts, such as cakes, cookies, ice cream, and candy.  How does alcohol affect me? Alcohol can cause a sudden decrease in blood glucose (hypoglycemia), especially if you use insulin or take certain oral diabetes medicines. Hypoglycemia can be a life-threatening condition. Symptoms of hypoglycemia (sleepiness, dizziness, and confusion) are similar to symptoms of having too much alcohol. If your health care provider says that alcohol is safe for you, follow these guidelines:  Limit alcohol intake to no more than 1 drink per day for nonpregnant women and 2 drinks per day for men. One drink equals 12 oz of beer, 5 oz of wine, or 1 oz of hard liquor.  Do not drink on an empty stomach.  Keep yourself hydrated with water, diet soda, or unsweetened iced tea.  Keep in  mind that regular soda, juice, and other mixers may contain a lot of sugar and must be counted as carbohydrates.  What are tips for following this plan? Reading food labels  Start by checking the serving size on the label. The amount of calories, carbohydrates, fats, and other nutrients listed on the label are based on one serving of the food. Many foods contain more than one  serving per package.  Check the total grams (g) of carbohydrates in one serving. You can calculate the number of servings of carbohydrates in one serving by dividing the total carbohydrates by 15. For example, if a food has 30 g of total carbohydrates, it would be equal to 2 servings of carbohydrates.  Check the number of grams (g) of saturated and trans fats in one serving. Choose foods that have low or no amount of these fats.  Check the number of milligrams (mg) of sodium in one serving. Most people should limit total sodium intake to less than 2,300 mg per day.  Always check the nutrition information of foods labeled as "low-fat" or "nonfat". These foods may be higher in added sugar or refined carbohydrates and should be avoided.  Talk to your dietitian to identify your daily goals for nutrients listed on the label. Shopping  Avoid buying canned, premade, or processed foods. These foods tend to be high in fat, sodium, and added sugar.  Shop around the outside edge of the grocery store. This includes fresh fruits and vegetables, bulk grains, fresh meats, and fresh dairy. Cooking  Use low-heat cooking methods, such as baking, instead of high-heat cooking methods like deep frying.  Cook using healthy oils, such as olive, canola, or sunflower oil.  Avoid cooking with butter, cream, or high-fat meats. Meal planning  Eat meals and snacks regularly, preferably at the same times every day. Avoid going long periods of time without eating.  Eat foods high in fiber, such as fresh fruits, vegetables, beans, and whole grains. Talk to your dietitian about how many servings of carbohydrates you can eat at each meal.  Eat 4-6 ounces of lean protein each day, such as lean meat, chicken, fish, eggs, or tofu. 1 ounce is equal to 1 ounce of meat, chicken, or fish, 1 egg, or 1/4 cup of tofu.  Eat some foods each day that contain healthy fats, such as avocado, nuts, seeds, and  fish. Lifestyle   Check your blood glucose regularly.  Exercise at least 30 minutes 5 or more days each week, or as told by your health care provider.  Take medicines as told by your health care provider.  Do not use any products that contain nicotine or tobacco, such as cigarettes and e-cigarettes. If you need help quitting, ask your health care provider.  Work with a Veterinary surgeon or diabetes educator to identify strategies to manage stress and any emotional and social challenges. What are some questions to ask my health care provider?  Do I need to meet with a diabetes educator?  Do I need to meet with a dietitian?  What number can I call if I have questions?  When are the best times to check my blood glucose? Where to find more information:  American Diabetes Association: diabetes.org/food-and-fitness/food  Academy of Nutrition and Dietetics: https://www.vargas.com/  [employer] of Diabetes and Digestive and Kidney Diseases (NIH): FindJewelers.cz Summary  A healthy meal plan will help you control your blood glucose and maintain a healthy lifestyle.  Working with a diet and nutrition specialist (dietitian) can  help you make a meal plan that is best for you.  Keep in mind that carbohydrates and alcohol have immediate effects on your blood glucose levels. It is important to count carbohydrates and to use alcohol carefully. This information is not intended to replace advice given to you by your health care provider. Make sure you discuss any questions you have with your health care provider. Document Released: 01/11/2005 Document Revised: 05/21/2016 Document Reviewed: 05/21/2016 Elsevier Interactive Patient Education  [employer].

## 2018-02-24 NOTE — Assessment & Plan Note (Signed)
Uncontrolled diabetes is likely contributing. Also check Vitamin D, Vitamin B 12, CMP, TSH, CBC.

## 2018-02-24 NOTE — Assessment & Plan Note (Addendum)
New diagnosis with A1C of 11.3. Long discussion regarding diabetes in general, also discussed nutrition. She declines offer for diabetic nutritionist.   Recommended treatment with Metformin and Lantus HS, she declines Lantus as she doesn't not want to start any injectable medication. Given this we will start her on Metformin and Glipizide as she will require dual therapy for treatment.  Rx for Metformin ER sent to pharmacy with specific instructions placed on AVS for dosing. Rx for Glipizide ER 5 mg sent to pharmacy.   Discussed to start checking glucose at least 2 times daily, rotating times of checks. Glucose logs provided. Rx for glucometer kit sent to pharmacy.   Urine microalbumin pending today. Also check Lipids, CMP.   Follow up in 3 weeks for BP check and glucose check.

## 2018-02-25 ENCOUNTER — Other Ambulatory Visit: Payer: Self-pay | Admitting: Primary Care

## 2018-02-25 DIAGNOSIS — E785 Hyperlipidemia, unspecified: Secondary | ICD-10-CM

## 2018-02-25 DIAGNOSIS — E559 Vitamin D deficiency, unspecified: Secondary | ICD-10-CM

## 2018-02-25 LAB — MICROALBUMIN / CREATININE URINE RATIO
Creatinine,U: 48.3 mg/dL
Microalb Creat Ratio: 1.7 mg/g (ref 0.0–30.0)
Microalb, Ur: 0.8 mg/dL (ref 0.0–1.9)

## 2018-02-25 MED ORDER — ATORVASTATIN CALCIUM 10 MG PO TABS: 10.0000 mg | ORAL_TABLET | Freq: Every day | ORAL | 3 refills | Status: DC

## 2018-02-25 MED ORDER — VITAMIN D (ERGOCALCIFEROL) 1.25 MG (50000 UNIT) PO CAPS: 50000.0000 [IU] | ORAL_CAPSULE | ORAL | 0 refills | Status: DC

## 2018-02-25 NOTE — Telephone Encounter (Signed)
Robin, FYI. 

## 2018-02-27 LAB — HM DIABETES EYE EXAM

## 2018-02-28 ENCOUNTER — Encounter: Payer: Self-pay | Admitting: Primary Care

## 2018-03-06 ENCOUNTER — Ambulatory Visit: Payer: Self-pay | Admitting: Primary Care

## 2018-03-06 ENCOUNTER — Encounter

## 2018-03-17 ENCOUNTER — Ambulatory Visit (INDEPENDENT_AMBULATORY_CARE_PROVIDER_SITE_OTHER): Payer: BLUE CROSS/BLUE SHIELD | Admitting: Primary Care

## 2018-03-17 VITALS — BP 136/82 | HR 105 | Temp 97.9°F | Ht 67.0 in | Wt 399.8 lb

## 2018-03-17 DIAGNOSIS — F411 Generalized anxiety disorder: Secondary | ICD-10-CM

## 2018-03-17 DIAGNOSIS — I10 Essential (primary) hypertension: Secondary | ICD-10-CM | POA: Diagnosis not present

## 2018-03-17 DIAGNOSIS — F331 Major depressive disorder, recurrent, moderate: Secondary | ICD-10-CM | POA: Diagnosis not present

## 2018-03-17 DIAGNOSIS — E559 Vitamin D deficiency, unspecified: Secondary | ICD-10-CM

## 2018-03-17 DIAGNOSIS — R7989 Other specified abnormal findings of blood chemistry: Secondary | ICD-10-CM

## 2018-03-17 DIAGNOSIS — E119 Type 2 diabetes mellitus without complications: Secondary | ICD-10-CM | POA: Diagnosis not present

## 2018-03-17 DIAGNOSIS — R5382 Chronic fatigue, unspecified: Secondary | ICD-10-CM

## 2018-03-17 DIAGNOSIS — E781 Pure hyperglyceridemia: Secondary | ICD-10-CM

## 2018-03-17 MED ORDER — DULOXETINE HCL 20 MG PO CPEP: 20.0000 mg | ORAL_CAPSULE | Freq: Every day | ORAL | 1 refills | Status: DC

## 2018-03-17 MED ORDER — HYDROCHLOROTHIAZIDE 12.5 MG PO TABS: 12.5000 mg | ORAL_TABLET | Freq: Every day | ORAL | 0 refills | Status: DC

## 2018-03-17 NOTE — Assessment & Plan Note (Signed)
Treated with atorvastatin 10 mg. Repeat lipids due next visit.

## 2018-03-17 NOTE — Assessment & Plan Note (Signed)
Suspect secondary to anxiety and depression, also some deconditioning. Will treat anxiety and depression. Encouraged any form of exercise.

## 2018-03-17 NOTE — Assessment & Plan Note (Signed)
Glucose readings improved. She continues to decline insulin for now. Continue Metformin and glipizide. Also strongly advised she work on her diet and start some exercise. Follow up in 2-3 weeks.

## 2018-03-17 NOTE — Assessment & Plan Note (Addendum)
GAD 7 score of 15. Discussed several options for treatment including therapy and medication, she opts for medication.   Rx for duloxetine 20 mg sent to pharmacy.  We discussed possible side effects of headache, GI upset, drowsiness, and SI/HI. If thoughts of SI/HI develop, we discussed to present to the emergency immediately. Patient verbalized understanding.   Follow up in 2-3 weeks for re-evaluation. Will continue FMLA through December 8th for now. May need to adjust return date.

## 2018-03-17 NOTE — Assessment & Plan Note (Signed)
Slightly elevated during last visit. Repeat TSH pending for next visit.

## 2018-03-17 NOTE — Assessment & Plan Note (Signed)
Improved on lisinopril 20 mg, not yet at goal. Add in HCTZ 12.5 mg. Follow up in 3 weeks for BP check and BMP.

## 2018-03-17 NOTE — Assessment & Plan Note (Addendum)
Compliant to vitamin D 50,000 units weekly. Continue same. Repeat vitamin D next visit.

## 2018-03-17 NOTE — Patient Instructions (Signed)
Start hydrochlorothiazide 12.5 mg tablets for blood pressure. Continue taking lisinopril 20 mg for blood pressure.  Start duloxetine (Cymbalta) 20 mg for anxiety and depression. Take 1 capsule once daily.   Continue to monitor your glucose readings 2 times daily.   Schedule a follow up visit for December 6th for follow up. Come fasting to this appointment so we can check your labs.  It was a pleasure to see you today!

## 2018-03-17 NOTE — Assessment & Plan Note (Signed)
PHQ 9 score of 19 today. Discussed several options for treatment including therapy and medication, she opts for medication.   Rx for duloxetine 20 mg sent to pharmacy.  We discussed possible side effects of headache, GI upset, drowsiness, and SI/HI. If thoughts of SI/HI develop, we discussed to present to the emergency immediately. Patient verbalized understanding.   Follow up in 6 weeks for re-evaluation.

## 2018-03-17 NOTE — Progress Notes (Signed)
Subjective:    Patient ID: Brandy [name], female    DOB: 1965-10-31, 52 y.o.   MRN: [id number]  HPI  Brandy [name] is a 52 year old female who presents today for follow up.  1) Type 2 Diabetes:  Current medications include: Metformin ER 1000 mg daily, glipizide ER 5 mg. She is compliant to her current medications. She declined insulin and continues to do so.  She just received her glucose meter yesterday. She's been using her husband's glucometer 6 times over the last several weeks. Readings have ranged from 190-low 300's.   Last A1C: 11.3 in October 2019, new diagnosis.  Last Eye Exam: Completed in October 2019 Last Foot Exam: Due Pneumonia Vaccination: Due ACE/ARB: Urine microalbumin negative in October 2019 Statin: Lipitor  Wt Readings from Last 3 Encounters:  03/17/18 (!) 399 lb 12.8 oz (181.3 kg)  02/24/18 (!) 398 lb 8 oz (180.8 kg)  02/20/18 (!) 399 lb (181 kg)     2) Essential Hypertension: Evaluated as a new patient two weeks ago. Blood pressure reading above goal at that time, also with prior history of hypertension. During her last visit we initiated lisinopril 20 mg and asked her to follow up. She's checked her BP at home twice which was "high".   BP Readings from Last 3 Encounters:  03/17/18 136/82  02/24/18 (!) 144/86  02/20/18 132/84    3) Anxiety/Stress: Evaluated as a new patient in late October 2019 and during that visit she endorsed a lot of stress in her personal and work life. She was under so much stress that she'd let her own health deteriorate. She requested FMLA for a chance to get her health back on track.  Since her last visit she doesn't feel as though she's doing much better. She finds that she's sleeping more due to fatigue. Other symptoms include daily worry, irritability, feeling down, decreased concentration. Labs completed last visit with B12 in the lower normal range, she has been taking oral B12 daily.   She is not ready to go back to work  yet, thinks she'll be ready on 04/07/18. She's working on improving her health and needs some additional time. GAD 7 score of 15 and PHQ 9 score of 19 today. She denies SI/HI.   Review of Systems  Respiratory: Negative for shortness of breath.   Cardiovascular: Negative for chest pain.  Neurological: Negative for dizziness and headaches.  Psychiatric/Behavioral:       See HPI       Past Medical History:  Diagnosis Date  . Depression   . Dysmetabolic syndrome X   . Hypertension   . Hypertriglyceridemia   . Obesity   . Tetanus vaccine causing adverse effect in therapeutic use      Social History   Socioeconomic History  . Marital status: Married    Spouse name: Not on file  . Number of children: 3  . Years of education: Not on file  . Highest education level: Not on file  Occupational History  . Occupation: Primary school teacher: [employer]  . Financial resource strain: Not on file  . Food insecurity:    Worry: Not on file    Inability: Not on file  . Transportation needs:    Medical: Not on file    Non-medical: Not on file  Tobacco Use  . Smoking status: Never Smoker  . Smokeless tobacco: Never Used  Substance and Sexual Activity  . Alcohol use:  Yes    Comment: occasional  . Drug use: No  . Sexual activity: Not on file  Lifestyle  . Physical activity:    Days per week: Not on file    Minutes per session: Not on file  . Stress: Not on file  Relationships  . Social connections:    Talks on phone: Not on file    Gets together: Not on file    Attends religious service: Not on file    Active member of club or organization: Not on file    Attends meetings of clubs or organizations: Not on file    Relationship status: Not on file  . Intimate partner violence:    Fear of current or ex partner: Not on file    Emotionally abused: Not on file    Physically abused: Not on file    Forced sexual activity: Not on file  Other Topics Concern  .  Not on file  Social History Narrative   Designer, jewellery      Married; 3 kids (middle child-severely autistic)      No regular exercise    Past Surgical History:  Procedure Laterality Date  . 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 Rheum arthritis Mother   . COPD Mother   . Hypertension Father   . Heart disease Maternal Grandmother   . Stroke Maternal Grandmother   . Alcohol abuse Maternal Grandmother   . Alcohol abuse Maternal Grandfather   . Aneurysm Paternal Grandmother   . Autism Son        severe    Allergies  Allergen Reactions  . Diphenhydramine Hcl     REACTION: minor rash  . Penicillins     REACTION: reaction as a child    Current Outpatient Medications on File Prior to Visit  Medication Sig Dispense Refill  . atorvastatin (LIPITOR) 10 MG tablet Take 1 tablet (10 mg total) by mouth daily. For cholesterol. 90 tablet 3  . blood glucose meter kit and supplies KIT Dispense based on patient and insurance preference. Use up to four times daily as directed. (FOR ICD-9 250.00, 250.01). 1 each 0  . cetirizine (ZYRTEC) 10 MG tablet Take 1 tablet (10 mg total) by mouth daily. 30 tablet 11  . glipiZIDE (GLUCOTROL XL) 5 MG 24 hr tablet Take 1 tablet (5 mg total) by mouth daily with breakfast. For diabetes. 90 tablet 1  . lisinopril (PRINIVIL,ZESTRIL) 20 MG tablet Take 1 tablet (20 mg total) by mouth daily. For blood pressure. 90 tablet 3  . metFORMIN (GLUCOPHAGE-XR) 500 MG 24 hr tablet Take 2 tablets (1,000 mg total) by mouth daily with breakfast. For diabetes. 180 tablet 1  . Vitamin D, Ergocalciferol, (DRISDOL) 50000 units CAPS capsule Take 1 capsule (50,000 Units total) by mouth every 7 (seven) days. 12 capsule 0   No current facility-administered medications on file prior to visit.     BP 136/82   Pulse (!) 105   Temp 97.9 F (36.6 C) (Oral)   Ht '5\' 7"'  (1.702 m)   Wt (!) 399 lb 12.8 oz (181.3 kg)   SpO2 95%    BMI 62.62 kg/m    Objective:   Physical Exam  Constitutional: She appears well-nourished.  Neck: Neck supple.  Cardiovascular: Normal rate and regular rhythm.  Respiratory: Effort normal and breath sounds normal.  Skin: Skin is warm and dry.  Psychiatric: She has a  normal mood and affect.           Assessment & Plan:

## 2018-03-18 ENCOUNTER — Telehealth: Payer: Self-pay | Admitting: Primary Care

## 2018-03-18 NOTE — Telephone Encounter (Signed)
Pt signed medicare records release wanting 11/18 office note sent to [employer].  [employer] faxed release wanting additional information  I spoke with kristina brown she stated all she needed was 11/18 office note.   Office note faxed patient aware

## 2018-04-04 ENCOUNTER — Ambulatory Visit (INDEPENDENT_AMBULATORY_CARE_PROVIDER_SITE_OTHER): Payer: BLUE CROSS/BLUE SHIELD | Admitting: Primary Care

## 2018-04-04 ENCOUNTER — Encounter: Payer: Self-pay | Admitting: Primary Care

## 2018-04-04 VITALS — BP 130/80 | HR 104 | Temp 98.1°F | Ht 67.0 in | Wt 390.8 lb

## 2018-04-04 DIAGNOSIS — F411 Generalized anxiety disorder: Secondary | ICD-10-CM

## 2018-04-04 DIAGNOSIS — R5383 Other fatigue: Secondary | ICD-10-CM | POA: Diagnosis not present

## 2018-04-04 DIAGNOSIS — R5382 Chronic fatigue, unspecified: Secondary | ICD-10-CM | POA: Diagnosis not present

## 2018-04-04 DIAGNOSIS — E781 Pure hyperglyceridemia: Secondary | ICD-10-CM | POA: Diagnosis not present

## 2018-04-04 DIAGNOSIS — R7989 Other specified abnormal findings of blood chemistry: Secondary | ICD-10-CM

## 2018-04-04 DIAGNOSIS — E559 Vitamin D deficiency, unspecified: Secondary | ICD-10-CM

## 2018-04-04 DIAGNOSIS — M79642 Pain in left hand: Secondary | ICD-10-CM

## 2018-04-04 DIAGNOSIS — I10 Essential (primary) hypertension: Secondary | ICD-10-CM

## 2018-04-04 DIAGNOSIS — E119 Type 2 diabetes mellitus without complications: Secondary | ICD-10-CM

## 2018-04-04 DIAGNOSIS — M79641 Pain in right hand: Secondary | ICD-10-CM

## 2018-04-04 DIAGNOSIS — F331 Major depressive disorder, recurrent, moderate: Secondary | ICD-10-CM

## 2018-04-04 DIAGNOSIS — N915 Oligomenorrhea, unspecified: Secondary | ICD-10-CM

## 2018-04-04 LAB — COMPREHENSIVE METABOLIC PANEL
ALBUMIN: 3.9 g/dL (ref 3.5–5.2)
ALT: 20 U/L (ref 0–35)
AST: 34 U/L (ref 0–37)
Alkaline Phosphatase: 103 U/L (ref 39–117)
BILIRUBIN TOTAL: 0.6 mg/dL (ref 0.2–1.2)
BUN: 15 mg/dL (ref 6–23)
CHLORIDE: 98 meq/L (ref 96–112)
CO2: 27 meq/L (ref 19–32)
CREATININE: 0.64 mg/dL (ref 0.40–1.20)
Calcium: 8.9 mg/dL (ref 8.4–10.5)
GFR: 102.49 mL/min (ref >60.00)
Glucose, Bld: 219 mg/dL — ABNORMAL HIGH (ref 70–99)
Potassium: 4.6 mEq/L (ref 3.5–5.1)
Sodium: 134 mEq/L — ABNORMAL LOW (ref 135–145)
Total Protein: 7 g/dL (ref 6.0–8.3)

## 2018-04-04 LAB — CBC
HEMATOCRIT: 38.9 % (ref 36.0–46.0)
Hemoglobin: 13 g/dL (ref 12.0–15.0)
MCHC: 33.5 g/dL (ref 30.0–36.0)
MCV: 87.9 fl (ref 78.0–100.0)
Platelets: 297 10*3/uL (ref 150.0–400.0)
RBC: 4.42 Mil/uL (ref 3.87–5.11)
RDW: 14.9 % (ref 11.5–15.5)
WBC: 7.3 10*3/uL (ref 4.0–10.5)

## 2018-04-04 LAB — LIPID PANEL
CHOL/HDL RATIO: 3
CHOLESTEROL: 146 mg/dL (ref 0–200)
HDL: 56 mg/dL (ref >39.00)
LDL CALC: 63 mg/dL (ref 0–99)
NonHDL: 90.31
Triglycerides: 139 mg/dL (ref 0.0–149.0)
VLDL: 27.8 mg/dL (ref 0.0–40.0)

## 2018-04-04 LAB — TSH: TSH: 3.38 u[IU]/mL (ref 0.35–4.50)

## 2018-04-04 LAB — SEDIMENTATION RATE: Sed Rate: 37 mm/hr — ABNORMAL HIGH (ref 0–30)

## 2018-04-04 LAB — VITAMIN D 25 HYDROXY (VIT D DEFICIENCY, FRACTURES): VITD: 9.46 ng/mL — ABNORMAL LOW (ref 30.00–100.00)

## 2018-04-04 LAB — VITAMIN B12: Vitamin B-12: 637 pg/mL (ref 211–911)

## 2018-04-04 MED ORDER — DULOXETINE HCL 20 MG PO CPEP: 40.0000 mg | ORAL_CAPSULE | Freq: Every day | ORAL | 1 refills | Status: DC

## 2018-04-04 NOTE — Assessment & Plan Note (Signed)
Improved on lisinopril 20 mg and HCTZ 12.5 mg.  Continue same.  BMP pending.

## 2018-04-04 NOTE — Assessment & Plan Note (Signed)
Repeat TSH pending today.  This could be the source of her fatigue.  Discussed potential initiation of levothyroxine and instructions for taking.  Await labs.

## 2018-04-04 NOTE — Progress Notes (Signed)
Subjective:    Patient ID: Brandy [name], female    DOB: July 15, 1965, 52 y.o.   MRN: [id number]  HPI  Brandy [name] is a 52 year old female who presents today for follow up.  1) Type 2 Diabetes:  Currently managed on Metformin ER 1000 mg daily, glipizide ER 5 mg. New diagnosis of type 2 diabetes during her establish care visit in October 2019, A1C of 11.3.   She is checking her glucose 1-2 times daily at various times of the day and is getting readings of mid 100's-mid 200's.  She is checking before meals, 2 hours after meals, bedtime.  2) Essential Hypertension: Currently managed on lisinopril 20 mg and HCTZ 12.5 mg. During her last visit HCTZ 12.5 mg was added given borderline readings on lisinopril 20 mg alone. She is not checking her BP at home.   BP Readings from Last 3 Encounters:  04/04/18 130/80  03/17/18 136/82  02/24/18 (!) 144/86    3) Anxiety and Depression: Evaluated last in mid November with reports of fatigue, irritability, feeling down, decreased concentration. GAD 7 score of 15 and PHQ 9 score of 19. She is currently on leave from work and is scheduled to return on 04/07/18.  She does have a history of generalized body aches/pain.  Given her symptoms she was initiated on duloxetine 20 mg.  Since her last visit she continues to feel fatigued. She has felt nauseated and jittery, early satiety and is not eating as much as before. Her main concern is fatigue, she does wake during the night, snores, feels daytime tiredness and will "crash" during the middle of her work day. Will often have to put her head down for few minutes.  She naps daily.  She is not ready to return to work next week given her fatigue, daytime drowsiness, and anxiety. She would like to extend another month and return on January 6th.   Review of Systems  Constitutional: Positive for fatigue.  Respiratory: Negative for shortness of breath.   Cardiovascular: Negative for chest pain.  Musculoskeletal:  Positive for arthralgias and myalgias. Negative for joint swelling.  Neurological: Negative for dizziness, numbness and headaches.  Psychiatric/Behavioral:       See HPI       Past Medical History:  Diagnosis Date  . Depression   . Dysmetabolic syndrome X   . Hypertension   . Hypertriglyceridemia   . Obesity   . Tetanus vaccine causing adverse effect in therapeutic use      Social History   Socioeconomic History  . Marital status: Married    Spouse name: Not on file  . Number of children: 3  . Years of education: Not on file  . Highest education level: Not on file  Occupational History  . Occupation: Primary school teacher: [employer]  . Financial resource strain: Not on file  . Food insecurity:    Worry: Not on file    Inability: Not on file  . Transportation needs:    Medical: Not on file    Non-medical: Not on file  Tobacco Use  . Smoking status: Never Smoker  . Smokeless tobacco: Never Used  Substance and Sexual Activity  . Alcohol use: Yes    Comment: occasional  . Drug use: No  . Sexual activity: Not on file  Lifestyle  . Physical activity:    Days per week: Not on file    Minutes per session: Not on file  .  Stress: Not on file  Relationships  . Social connections:    Talks on phone: Not on file    Gets together: Not on file    Attends religious service: Not on file    Active member of club or organization: Not on file    Attends meetings of clubs or organizations: Not on file    Relationship status: Not on file  . Intimate partner violence:    Fear of current or ex partner: Not on file    Emotionally abused: Not on file    Physically abused: Not on file    Forced sexual activity: Not on file  Other Topics Concern  . Not on file  Social History Narrative   Designer, jewellery      Married; 3 kids (middle child-severely autistic)      No regular exercise    Past Surgical History:  Procedure Laterality Date  . BONE TUMOR  EXCISION  1975   Right leg    Family History  Problem Relation Age of Onset  . Rheum arthritis Mother   . COPD Mother   . Hypertension Father   . Heart disease Maternal Grandmother   . Stroke Maternal Grandmother   . Alcohol abuse Maternal Grandmother   . Alcohol abuse Maternal Grandfather   . Aneurysm Paternal Grandmother   . Autism Son        severe    Allergies  Allergen Reactions  . Diphenhydramine Hcl     REACTION: minor rash  . Penicillins     REACTION: reaction as a child    Current Outpatient Medications on File Prior to Visit  Medication Sig Dispense Refill  . atorvastatin (LIPITOR) 10 MG tablet Take 1 tablet (10 mg total) by mouth daily. For cholesterol. 90 tablet 3  . blood glucose meter kit and supplies KIT Dispense based on patient and insurance preference. Use up to four times daily as directed. (FOR ICD-9 250.00, 250.01). 1 each 0  . cetirizine (ZYRTEC) 10 MG tablet Take 1 tablet (10 mg total) by mouth daily. 30 tablet 11  . DULoxetine (CYMBALTA) 20 MG capsule Take 1 capsule (20 mg total) by mouth daily. For anxiety and depression. 30 capsule 1  . glipiZIDE (GLUCOTROL XL) 5 MG 24 hr tablet Take 1 tablet (5 mg total) by mouth daily with breakfast. For diabetes. 90 tablet 1  . hydrochlorothiazide (HYDRODIURIL) 12.5 MG tablet Take 1 tablet (12.5 mg total) by mouth daily. 90 tablet 0  . lisinopril (PRINIVIL,ZESTRIL) 20 MG tablet Take 1 tablet (20 mg total) by mouth daily. For blood pressure. 90 tablet 3  . metFORMIN (GLUCOPHAGE-XR) 500 MG 24 hr tablet Take 2 tablets (1,000 mg total) by mouth daily with breakfast. For diabetes. 180 tablet 1  . Vitamin D, Ergocalciferol, (DRISDOL) 50000 units CAPS capsule Take 1 capsule (50,000 Units total) by mouth every 7 (seven) days. 12 capsule 0   No current facility-administered medications on file prior to visit.     BP 130/80   Pulse (!) 104   Temp 98.1 F (36.7 C) (Oral)   Ht '5\' 7"'$  (1.702 m)   Wt (!) 390 lb 12 oz (177.2  kg)   LMP 03/29/2018   SpO2 94%   BMI 61.20 kg/m    Objective:   Physical Exam  Constitutional: She is oriented to person, place, and time. She appears well-nourished.  Neck: Neck supple.  Cardiovascular: Normal rate and regular rhythm.  Respiratory: Effort normal and breath sounds normal.  Neurological:  She is alert and oriented to person, place, and time.  Skin: Skin is warm and dry.  Psychiatric: She has a normal mood and affect.           Assessment & Plan:

## 2018-04-04 NOTE — Assessment & Plan Note (Signed)
Return of menses 1 week ago, completed yesterday.

## 2018-04-04 NOTE — Patient Instructions (Signed)
Stop by the lab prior to leaving today. I will notify you of your results once received.   You will be contacted regarding your referral to pulmonology.  Please let us know if you have not been contacted within one week.   Continue taking your current medications.   Please tell Zella BallRobin to extend your FMLA with a return date of 05/05/18.   Schedule a follow up visit with me for 05/01/18 at the end of session.   It was a pleasure to see you today!

## 2018-04-04 NOTE — Assessment & Plan Note (Signed)
Denies feeling depressed today, although PHQ 9 score of 19 last visit.  Continue duloxetine, increasing dose to 40 mg.  She will update.  We will see her in 1 month.

## 2018-04-04 NOTE — Assessment & Plan Note (Signed)
Glucose readings have improved.  Continue metformin and glipizide.  She is working to adjust her diet, continue same.  Managed on statin and ACE. Foot exam next visit. Pneumonia vaccination next visit.  Follow-up in 3 to 4 weeks with glucose logs.

## 2018-04-04 NOTE — Assessment & Plan Note (Signed)
Repeat lipids pending.  Compliant to statin.

## 2018-04-04 NOTE — Assessment & Plan Note (Signed)
Have symptoms of obstructive sleep apnea, and given her body habitus this is likely.  Referral placed to pulmonology for sleep study.  Also rechecking TSH, B12, CBC.  We will also continue working on treating anxiety and depression.

## 2018-04-04 NOTE — Assessment & Plan Note (Signed)
Compliant to vitamin D weekly.  Repeat vitamin D level pending.

## 2018-04-04 NOTE — Assessment & Plan Note (Addendum)
Little to no improvement on duloxetine 20 mg.  Will trial an increase in her dose to 40 mg, she will adjust with the medication she has at home.  She will update us in a couple of weeks.  We will see her back in the office in 1 month, agreed to extend her FMLA with a return date of May 05, 2018.  Denies SI/HI.

## 2018-04-07 ENCOUNTER — Telehealth: Payer: Self-pay | Admitting: Primary Care

## 2018-04-07 LAB — CYCLIC CITRUL PEPTIDE ANTIBODY, IGG: Cyclic Citrullin Peptide Ab: <16 UNITS

## 2018-04-07 LAB — RHEUMATOID FACTOR: Rheumatoid fact SerPl-aCnc: <14 IU/mL (ref <14)

## 2018-04-07 NOTE — Telephone Encounter (Signed)
Revised paperwork faxed 12/9 Copy for scan

## 2018-04-07 NOTE — Telephone Encounter (Signed)
There was a note on my desk to exten fmla to 05/05/18 Paperwork in kate[employer] in box for initial and date

## 2018-04-07 NOTE — Telephone Encounter (Signed)
Completed and placed on Robins desk. 

## 2018-04-07 NOTE — Telephone Encounter (Signed)
Pt aware paperwork has been faxed She stated [employer] will be faxing medical records release asking for Friday office note test results and referral to pulm  She is aware I will fax this once I get request

## 2018-04-08 NOTE — Telephone Encounter (Signed)
Received medial records release  Office note 12/6 faxed to [employer] Pt aware

## 2018-04-10 ENCOUNTER — Institutional Professional Consult (permissible substitution): Payer: BLUE CROSS/BLUE SHIELD | Admitting: Internal Medicine

## 2018-04-14 ENCOUNTER — Ambulatory Visit (INDEPENDENT_AMBULATORY_CARE_PROVIDER_SITE_OTHER): Payer: BLUE CROSS/BLUE SHIELD | Admitting: Internal Medicine

## 2018-04-14 ENCOUNTER — Encounter: Payer: Self-pay | Admitting: Internal Medicine

## 2018-04-14 VITALS — BP 128/80 | HR 138 | Resp 16 | Ht 67.0 in | Wt 387.0 lb

## 2018-04-14 DIAGNOSIS — G4719 Other hypersomnia: Secondary | ICD-10-CM | POA: Diagnosis not present

## 2018-04-14 NOTE — Patient Instructions (Signed)

## 2018-04-14 NOTE — Progress Notes (Signed)
[facility] Consultation      Assessment and Plan:  Excessive daytime sleepiness. - Symptoms and signs of obstructive sleep apnea. - We will send for sleep study, given BMI of 61, patient will likely require in lab sleep study. - She is hesitant to start CPAP, but upon our discussion she will follow whenever recommendations will get her feeling better.  Depression, essential hypertension. - Sleep apnea can contribute to above conditions, therefore treatment of sleep apnea is an important part of their management.  Obesity. - BMI 61, discussed that weight loss would be beneficial.  Date: 04/14/2018  MRN# [id number] Brandy [name] 1966/02/09   Brandy [name] is a 52 y.o. old female seen in consultation for chief complaint of:    Chief Complaint  Patient presents with  . Consult    Referred by Alma Friendly for eval OSA  . Snoring    HPI:   Patient is referred for symptoms of excessive daytime sleepiness.  She usually goes to bed at 11 PM, she wakes up several times per night, she goes to bathroom several times per night. Often has trouble falling back to sleep. She gets out of bed around 7:30 AM.  Her Epworth score is elevated at 16 today. She finds that she is getting tired during the day. She is currently not working, but when she did she was getting very sleepy around 5 pm, and take a nap when she got home. But now she is getting sleepy earlier during the day and now she has to take naps at lunch time and sometimes have to sleep longer than an hour. She was having trouble focusing/concentrating.  She has now taken a few weeks off because of this and medical issues.    PMHX:   Past Medical History:  Diagnosis Date  . Depression   . Dysmetabolic syndrome X   . Hypertension   . Hypertriglyceridemia   . Obesity   . Tetanus vaccine causing adverse effect in therapeutic use    Surgical Hx:  Past Surgical History:  Procedure Laterality Date  . BONE  TUMOR EXCISION  1975   Right leg   Family Hx:  Family History  Problem Relation Age of Onset  . Rheum arthritis Mother   . COPD Mother   . Hypertension Father   . Heart disease Maternal Grandmother   . Stroke Maternal Grandmother   . Alcohol abuse Maternal Grandmother   . Alcohol abuse Maternal Grandfather   . Aneurysm Paternal Grandmother   . Autism Son        severe   Social Hx:   Social History   Tobacco Use  . Smoking status: Never Smoker  . Smokeless tobacco: Never Used  Substance Use Topics  . Alcohol use: Yes    Comment: occasional  . Drug use: No   Medication:    Current Outpatient Medications:  .  atorvastatin (LIPITOR) 10 MG tablet, Take 1 tablet (10 mg total) by mouth daily. For cholesterol., Disp: 90 tablet, Rfl: 3 .  blood glucose meter kit and supplies KIT, Dispense based on patient and insurance preference. Use up to four times daily as directed. (FOR ICD-9 250.00, 250.01)., Disp: 1 each, Rfl: 0 .  cetirizine (ZYRTEC) 10 MG tablet, Take 1 tablet (10 mg total) by mouth daily., Disp: 30 tablet, Rfl: 11 .  DULoxetine (CYMBALTA) 20 MG capsule, Take 2 capsules (40 mg total) by mouth daily. For anxiety and depression., Disp: 30 capsule, Rfl: 1 .  glipiZIDE (GLUCOTROL XL) 5 MG 24 hr tablet, Take 1 tablet (5 mg total) by mouth daily with breakfast. For diabetes., Disp: 90 tablet, Rfl: 1 .  hydrochlorothiazide (HYDRODIURIL) 12.5 MG tablet, Take 1 tablet (12.5 mg total) by mouth daily., Disp: 90 tablet, Rfl: 0 .  lisinopril (PRINIVIL,ZESTRIL) 20 MG tablet, Take 1 tablet (20 mg total) by mouth daily. For blood pressure., Disp: 90 tablet, Rfl: 3 .  metFORMIN (GLUCOPHAGE-XR) 500 MG 24 hr tablet, Take 2 tablets (1,000 mg total) by mouth daily with breakfast. For diabetes., Disp: 180 tablet, Rfl: 1 .  Vitamin D, Ergocalciferol, (DRISDOL) 50000 units CAPS capsule, Take 1 capsule (50,000 Units total) by mouth every 7 (seven) days., Disp: 12 capsule, Rfl: 0   Allergies:    Diphenhydramine hcl and Penicillins  Review of Systems: Gen:  Denies  fever, sweats, chills HEENT: Denies blurred vision, double vision. bleeds, sore throat Cvc:  No dizziness, chest pain. Resp:   Denies cough or sputum production, shortness of breath Gi: Denies swallowing difficulty, stomach pain. Gu:  Denies bladder incontinence, burning urine Ext:   No Joint pain, stiffness. Skin: No skin rash,  hives  Endoc:  No polyuria, polydipsia. Psych: No hallucinations. Other:  All other systems were reviewed with the patient and were negative other that what is mentioned in the HPI.   Physical Examination:   VS: BP 128/80 (BP Location: Left Arm, Cuff Size: Large)   Pulse (!) 138   Resp 16   Ht '5\' 7"'  (1.702 m)   Wt (!) 387 lb (175.5 kg)   LMP 03/29/2018   SpO2 96%   BMI 60.61 kg/m   General Appearance: No distress, obese. Neuro:without focal findings,  speech normal,  HEENT: PERRLA, EOM intact.  Mallampati 3. Pulmonary: normal breath sounds, No wheezing.  CardiovascularNormal S1,S2.  No m/r/g.   Abdomen: Benign, Soft, non-tender. Renal:  No costovertebral tenderness  GU:  No performed at this time. Endoc: No evident thyromegaly, no signs of acromegaly. Skin:   warm, no rashes, no ecchymosis  Extremities: normal, no cyanosis, clubbing.  Other findings:    LABORATORY PANEL:   CBC No results for input(s): WBC, HGB, HCT, PLT in the last 168 hours. ------------------------------------------------------------------------------------------------------------------  Chemistries  No results for input(s): NA, K, CL, CO2, GLUCOSE, BUN, CREATININE, CALCIUM, MG, AST, ALT, ALKPHOS, BILITOT in the last 168 hours.  Invalid input(s): GFRCGP ------------------------------------------------------------------------------------------------------------------  Cardiac Enzymes No results for input(s): TROPONINI in the last 168  hours. ------------------------------------------------------------  RADIOLOGY:  No results found.     Thank  you for the consultation and for allowing [facility] to assist in the care of your patient. Our recommendations are noted above.  Please contact us if we can be of further service.   [name] Stalker, M.D., F.C.C.P.  Board Certified in Internal Medicine, Pulmonary Medicine, [place], and Sleep Medicine.  [facility] Office Number: [phone]   04/14/2018

## 2018-04-15 DIAGNOSIS — F411 Generalized anxiety disorder: Secondary | ICD-10-CM

## 2018-04-15 MED ORDER — DULOXETINE HCL 60 MG PO CPEP: 60.0000 mg | ORAL_CAPSULE | Freq: Every day | ORAL | 0 refills | Status: DC

## 2018-05-01 ENCOUNTER — Encounter: Payer: Self-pay | Admitting: Primary Care

## 2018-05-01 ENCOUNTER — Ambulatory Visit (INDEPENDENT_AMBULATORY_CARE_PROVIDER_SITE_OTHER): Payer: BC Managed Care – PPO | Admitting: Primary Care

## 2018-05-01 VITALS — BP 124/82 | HR 117 | Temp 98.1°F | Ht 67.0 in | Wt 381.5 lb

## 2018-05-01 DIAGNOSIS — F331 Major depressive disorder, recurrent, moderate: Secondary | ICD-10-CM

## 2018-05-01 DIAGNOSIS — E119 Type 2 diabetes mellitus without complications: Secondary | ICD-10-CM | POA: Diagnosis not present

## 2018-05-01 DIAGNOSIS — F411 Generalized anxiety disorder: Secondary | ICD-10-CM

## 2018-05-01 DIAGNOSIS — I10 Essential (primary) hypertension: Secondary | ICD-10-CM | POA: Diagnosis not present

## 2018-05-01 DIAGNOSIS — R5382 Chronic fatigue, unspecified: Secondary | ICD-10-CM

## 2018-05-01 DIAGNOSIS — M2550 Pain in unspecified joint: Secondary | ICD-10-CM | POA: Insufficient documentation

## 2018-05-01 DIAGNOSIS — R7989 Other specified abnormal findings of blood chemistry: Secondary | ICD-10-CM

## 2018-05-01 MED ORDER — GLIPIZIDE ER 10 MG PO TB24: 10.0000 mg | ORAL_TABLET | Freq: Every day | ORAL | 0 refills | Status: DC

## 2018-05-01 NOTE — Assessment & Plan Note (Signed)
Scheduled for sleep study on January 10.  Obstructive sleep apnea could very well be contributing to her fatigue.

## 2018-05-01 NOTE — Assessment & Plan Note (Signed)
Repeat TSH in early December 2019 within normal range.  Will repeat TSH at upcoming visit to ensure stability.

## 2018-05-01 NOTE — Assessment & Plan Note (Signed)
Overall glucose levels seem improved but are still above goal.  Increase glipizide to 10 mg, new prescription sent to pharmacy.  Continue metformin as prescribed.  Follow-up in 3 weeks for repeat A1c.

## 2018-05-01 NOTE — Assessment & Plan Note (Signed)
Continues.  Unsure of Cymbalta is actually helping with anxiety but will continue for now given the improvement in her arthralgias. Referral placed to therapy as I suspect that she would greatly benefit.  Consider adding in low-dose Wellbutrin next visit if needed.  Extended short-term disability through January 26 with a return date of January 27.

## 2018-05-01 NOTE — Assessment & Plan Note (Signed)
Stable in the office today.  Continue lisinopril and hydrochlorothiazide.

## 2018-05-01 NOTE — Progress Notes (Signed)
Subjective:    Patient ID: Brandy [name], female    DOB: [birth year], 53 y.o.   MRN: [id number]  HPI  Brandy [name] is a 53 year old female who presents today for follow up.  1) Type 2 Diabetes:  Currently managed on Metformin ER 1000 mg daily, glipizide ER 5 mg daily. Diagnosed with diabetes during her visit in October 2019 with A1C of 11.3. Since then she's been compliant to her medication regimen and is checking her glucose levels. She was initiated on lisinopril for hypertension and also for renal protection.  Also managed on atorvastatin.  Since her last visit she's checking her glucose levels once daily on average, mostly AM fasting. Her AM fasting readings are 180's-low 200's  Highest reading of 344 Lowest reading of 188  She endorses eating junk food over the holidays which has been tough, but plans on improving now.  Wt Readings from Last 3 Encounters:  05/01/18 (!) 381 lb 8 oz (173 kg)  04/14/18 (!) 387 lb (175.5 kg)  04/04/18 (!) 390 lb 12 oz (177.2 kg)   BP Readings from Last 3 Encounters:  05/01/18 124/82  04/14/18 128/80  04/04/18 130/80     2) Anxiety and Depression: Chronic due to personal and work stress. During her last visit she had a GAD 7 score of 15 and PHQ 9 score of 19. She was originally prescribed Cymbalta 20 mg given chronic arthralgias and myalgias. She's been communicating via my chart regarding her dose and is now up to 60 mg.   Since her last visit she's unsure if she's really noticed a difference in her anxiety symptoms. She has noticed weight loss through a decrease in appetite, experiences "morning sickness" that is overall tolerable. She continues to experience daily anxiety. Feels anxious, cannot focus, easily stressed, feels overwhelmed, some depression/feeling down, low motivation, feeling fatigued. She is scheduled for a sleep study on January 10th.     She continues to wake up nightly in pain but has noticed that she requires less  medication during the night.  She is scheduled to return to work on January 6, would like to wait until January 27 given the need for additional office follow-up for diabetes.  Review of Systems  Constitutional: Positive for fatigue.  Respiratory: Negative for shortness of breath.   Cardiovascular: Negative for chest pain.  Gastrointestinal: Positive for nausea.  Musculoskeletal: Positive for arthralgias and myalgias.  Neurological: Negative for dizziness.  Psychiatric/Behavioral: The patient is nervous/anxious.        Past Medical History:  Diagnosis Date  . Depression   . Dysmetabolic syndrome X   . Hypertension   . Hypertriglyceridemia   . Obesity   . Tetanus vaccine causing adverse effect in therapeutic use      Social History   Socioeconomic History  . Marital status: Married    Spouse name: Not on file  . Number of children: 3  . Years of education: Not on file  . Highest education level: Not on file  Occupational History  . Occupation: Primary school teacher: [employer]  . Financial resource strain: Not on file  . Food insecurity:    Worry: Not on file    Inability: Not on file  . Transportation needs:    Medical: Not on file    Non-medical: Not on file  Tobacco Use  . Smoking status: Never Smoker  . Smokeless tobacco: Never Used  Substance and Sexual Activity  .  Alcohol use: Yes    Comment: occasional  . Drug use: No  . Sexual activity: Not on file  Lifestyle  . Physical activity:    Days per week: Not on file    Minutes per session: Not on file  . Stress: Not on file  Relationships  . Social connections:    Talks on phone: Not on file    Gets together: Not on file    Attends religious service: Not on file    Active member of club or organization: Not on file    Attends meetings of clubs or organizations: Not on file    Relationship status: Not on file  . Intimate partner violence:    Fear of current or ex partner: Not on  file    Emotionally abused: Not on file    Physically abused: Not on file    Forced sexual activity: Not on file  Other Topics Concern  . Not on file  Social History Narrative   Designer, jewellery      Married; 3 kids (middle child-severely autistic)      No regular exercise    Past Surgical History:  Procedure Laterality Date  . 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 Rheum arthritis Mother   . COPD Mother   . Hypertension Father   . Heart disease Maternal Grandmother   . Stroke Maternal Grandmother   . Alcohol abuse Maternal Grandmother   . Alcohol abuse Maternal Grandfather   . Aneurysm Paternal Grandmother   . Autism Son        severe    Allergies  Allergen Reactions  . Diphenhydramine Hcl     REACTION: minor rash  . Penicillins     REACTION: reaction as a child    Current Outpatient Medications on File Prior to Visit  Medication Sig Dispense Refill  . atorvastatin (LIPITOR) 10 MG tablet Take 1 tablet (10 mg total) by mouth daily. For cholesterol. 90 tablet 3  . blood glucose meter kit and supplies KIT Dispense based on patient and insurance preference. Use up to four times daily as directed. (FOR ICD-9 250.00, 250.01). 1 each 0  . cetirizine (ZYRTEC) 10 MG tablet Take 1 tablet (10 mg total) by mouth daily. 30 tablet 11  . DULoxetine (CYMBALTA) 60 MG capsule Take 1 capsule (60 mg total) by mouth daily. 30 capsule 0  . hydrochlorothiazide (HYDRODIURIL) 12.5 MG tablet Take 1 tablet (12.5 mg total) by mouth daily. 90 tablet 0  . lisinopril (PRINIVIL,ZESTRIL) 20 MG tablet Take 1 tablet (20 mg total) by mouth daily. For blood pressure. 90 tablet 3  . metFORMIN (GLUCOPHAGE-XR) 500 MG 24 hr tablet Take 2 tablets (1,000 mg total) by mouth daily with breakfast. For diabetes. 180 tablet 1  . Vitamin D, Ergocalciferol, (DRISDOL) 50000 units CAPS capsule Take 1 capsule (50,000 Units total) by mouth every 7 (seven) days. 12  capsule 0   No current facility-administered medications on file prior to visit.     BP 124/82   Pulse (!) 117   Temp 98.1 F (36.7 C) (Oral)   Ht 5' 7" (1.702 m)   Wt (!) 381 lb 8 oz (173 kg)   LMP 03/29/2018   SpO2 98%   BMI 59.75 kg/m    Objective:   Physical Exam  Constitutional: She appears well-nourished.  Neck: Neck supple.  Cardiovascular: Normal rate and regular rhythm.  Respiratory:  Effort normal and breath sounds normal.  Skin: Skin is warm and dry.  Psychiatric: She has a normal mood and affect.           Assessment & Plan:

## 2018-05-01 NOTE — Assessment & Plan Note (Signed)
Chronic and continued, however improved slightly with Cymbalta 60 mg.  Continue same for now.

## 2018-05-01 NOTE — Assessment & Plan Note (Signed)
Symptoms of depression may overall be slightly improved on Cymbalta 60 mg.  Continue same for now.

## 2018-05-01 NOTE — Patient Instructions (Signed)
You will be contacted regarding your referral to therapy.  Please let us know if you have not been contacted within one week.   Continue Cymbalta 60 mg for anxiety, body aches, depression for now.  We've increased your dose of glipizide to 10 mg. You may take two of your 5 mg tablets to equal 10 mg until your bottle is empty.  Continue to check your blood sugars, try to rotate times of checks: Before any meal, 2 hours after a meal, bedtime.  Talk with Zella Ball about your return to work date of January 27th.   Schedule a follow up visit with me on January 24th.  It was a pleasure to see you today!

## 2018-05-06 ENCOUNTER — Telehealth: Payer: Self-pay | Admitting: *Deleted

## 2018-05-06 NOTE — Telephone Encounter (Signed)
Amber with [employer] called stating that patient has a short term disability policy with them. Amber stated that she received a fax from Mayra Reel NP on 05/02/18 with last office notes. Amber stated that she needs clarification if patient needs to be out of work?  Amber stated that they need to know what diagnosis would support her needing to be out of work and why?

## 2018-05-06 NOTE — Telephone Encounter (Signed)
Message left for Amber to return my call.

## 2018-05-06 NOTE — Telephone Encounter (Signed)
Please notify Brandy [name] that our plan is for her to return to work at the end of this month. Reasons include: Uncontrolled Type 2 Diabetes, Major Depressive Disorder, Anxiety Disorder.

## 2018-05-07 NOTE — Telephone Encounter (Signed)
Spoken and notified Amber of [employer] comments. Amber verbalized understanding.

## 2018-05-09 ENCOUNTER — Ambulatory Visit: Payer: BLUE CROSS/BLUE SHIELD | Attending: Internal Medicine

## 2018-05-09 ENCOUNTER — Other Ambulatory Visit: Payer: Self-pay | Admitting: Primary Care

## 2018-05-09 DIAGNOSIS — I10 Essential (primary) hypertension: Secondary | ICD-10-CM | POA: Insufficient documentation

## 2018-05-09 DIAGNOSIS — G4719 Other hypersomnia: Secondary | ICD-10-CM | POA: Insufficient documentation

## 2018-05-09 DIAGNOSIS — F411 Generalized anxiety disorder: Secondary | ICD-10-CM

## 2018-05-10 ENCOUNTER — Other Ambulatory Visit: Payer: Self-pay | Admitting: Primary Care

## 2018-05-10 DIAGNOSIS — F411 Generalized anxiety disorder: Secondary | ICD-10-CM

## 2018-05-12 NOTE — Telephone Encounter (Signed)
Last filled on 04/15/18 #30 caps with 0 refills, f/u schedule on 05/23/18, please advise

## 2018-05-13 NOTE — Telephone Encounter (Signed)
Noted.  Refill sent to pharmacy. 

## 2018-05-14 DIAGNOSIS — G4733 Obstructive sleep apnea (adult) (pediatric): Secondary | ICD-10-CM | POA: Diagnosis not present

## 2018-05-15 ENCOUNTER — Telehealth: Payer: Self-pay | Admitting: *Deleted

## 2018-05-15 NOTE — Telephone Encounter (Signed)
  Pt aware of findings.  Sleep apnea not seen on study. AHI 3.1 also inadequate sleep time of 136 mins. Obesity BMI 60. Recommendation: Follow up with referring physician Weight loss beneficial If sleep apnea still strongly suspected consider repeat with home sleep study or sedative medication for in-lab.

## 2018-05-23 ENCOUNTER — Other Ambulatory Visit: Payer: Self-pay | Admitting: Primary Care

## 2018-05-23 ENCOUNTER — Ambulatory Visit
Admission: RE | Admit: 2018-05-23 | Discharge: 2018-05-23 | Disposition: A | Discharge status: Home / Self Care | Payer: BLUE CROSS/BLUE SHIELD | Attending: Primary Care | Admitting: Primary Care

## 2018-05-23 ENCOUNTER — Encounter: Payer: Self-pay | Admitting: Primary Care

## 2018-05-23 ENCOUNTER — Ambulatory Visit
Admission: RE | Admit: 2018-05-23 | Discharge: 2018-05-23 | Disposition: A | Discharge status: Home / Self Care | Payer: BLUE CROSS/BLUE SHIELD | Source: Ambulatory Visit | Attending: Primary Care | Admitting: Primary Care

## 2018-05-23 ENCOUNTER — Ambulatory Visit (INDEPENDENT_AMBULATORY_CARE_PROVIDER_SITE_OTHER): Payer: BLUE CROSS/BLUE SHIELD | Admitting: Primary Care

## 2018-05-23 VITALS — BP 122/80 | HR 86 | Temp 97.8°F | Ht 67.0 in | Wt 383.5 lb

## 2018-05-23 DIAGNOSIS — G8929 Other chronic pain: Secondary | ICD-10-CM | POA: Insufficient documentation

## 2018-05-23 DIAGNOSIS — M2550 Pain in unspecified joint: Secondary | ICD-10-CM

## 2018-05-23 DIAGNOSIS — F331 Major depressive disorder, recurrent, moderate: Secondary | ICD-10-CM

## 2018-05-23 DIAGNOSIS — E538 Deficiency of other specified B group vitamins: Secondary | ICD-10-CM | POA: Insufficient documentation

## 2018-05-23 DIAGNOSIS — M545 Low back pain, unspecified: Secondary | ICD-10-CM

## 2018-05-23 DIAGNOSIS — R7989 Other specified abnormal findings of blood chemistry: Secondary | ICD-10-CM

## 2018-05-23 DIAGNOSIS — E559 Vitamin D deficiency, unspecified: Secondary | ICD-10-CM

## 2018-05-23 DIAGNOSIS — R5382 Chronic fatigue, unspecified: Secondary | ICD-10-CM

## 2018-05-23 DIAGNOSIS — E119 Type 2 diabetes mellitus without complications: Secondary | ICD-10-CM

## 2018-05-23 DIAGNOSIS — F411 Generalized anxiety disorder: Secondary | ICD-10-CM

## 2018-05-23 DIAGNOSIS — Z1239 Encounter for other screening for malignant neoplasm of breast: Secondary | ICD-10-CM

## 2018-05-23 LAB — POCT GLYCOSYLATED HEMOGLOBIN (HGB A1C): Hemoglobin A1C: 8.8 % — AB (ref 4.0–5.6)

## 2018-05-23 LAB — VITAMIN B12: Vitamin B-12: 738 pg/mL (ref 211–911)

## 2018-05-23 LAB — VITAMIN D 25 HYDROXY (VIT D DEFICIENCY, FRACTURES): VITD: 18.96 ng/mL — ABNORMAL LOW (ref 30.00–100.00)

## 2018-05-23 LAB — T4, FREE: Free T4: 0.71 ng/dL (ref 0.60–1.60)

## 2018-05-23 LAB — TSH: TSH: 1.88 u[IU]/mL (ref 0.35–4.50)

## 2018-05-23 MED ORDER — BUPROPION HCL ER (XL) 150 MG PO TB24: 150.0000 mg | ORAL_TABLET | Freq: Every day | ORAL | 0 refills | Status: DC

## 2018-05-23 MED ORDER — DULOXETINE HCL 60 MG PO CPEP: 60.0000 mg | ORAL_CAPSULE | Freq: Every day | ORAL | 3 refills | Status: DC

## 2018-05-23 MED ORDER — VITAMIN D (ERGOCALCIFEROL) 1.25 MG (50000 UNIT) PO CAPS: 50000.0000 [IU] | ORAL_CAPSULE | ORAL | 1 refills | Status: DC

## 2018-05-23 MED ORDER — GLIPIZIDE 10 MG PO TABS: 10.0000 mg | ORAL_TABLET | Freq: Two times a day (BID) | ORAL | 3 refills | Status: DC

## 2018-05-23 MED ORDER — DULAGLUTIDE 0.75 MG/0.5ML ~~LOC~~ SOAJ: SUBCUTANEOUS | 2 refills | Status: DC

## 2018-05-23 NOTE — Assessment & Plan Note (Signed)
No improvement on Cymbalta in terms of anxiety, does experience improvement in depression and arthralgias. Prescription for Wellbutrin 150 mg sent to pharmacy. Follow-up in 6 weeks for reevaluation.

## 2018-05-23 NOTE — Assessment & Plan Note (Signed)
Slight improvement in depression symptoms on Cymbalta 60 mg, continue same.  Add in Wellbutrin XL 150 mg daily. Follow-up in 6 weeks for reevaluation.

## 2018-05-23 NOTE — Assessment & Plan Note (Signed)
Repeat vitamin D pending. 

## 2018-05-23 NOTE — Assessment & Plan Note (Addendum)
Last TSH within normal range, repeat TSH pending.  Add free T4.

## 2018-05-23 NOTE — Patient Instructions (Addendum)
Complete xray(s) and lab prior to leaving today. I will notify you of your results once received.  We've changed your glipizide to the regular version. Take 1 tablet twice daily with meals.   Continue taking Metformin as prescribed.  Start dulaglutide (Trulicity) 0.75 mg. Inject into skin once weekly for diabetes. Notify me if this is too expensive.  Continue to monitor your blood sugars, write them down and bring the readings to your next visit.  Follow up with [facility] as scheduled for therapy.  Please schedule a follow up appointment in 6 weeks. Robin, please schedule at the end of a session (morning or afternoon).   It was a pleasure to see you today!

## 2018-05-23 NOTE — Assessment & Plan Note (Signed)
A1c today of 8.8 which is a significant improvement from 11.3 several months ago. Continue metformin as prescribed, will change glipizide to regular version as this will be more affordable.  New prescription sent to pharmacy.  We will also start Trulicity once weekly to aid in weight loss and also reduce glucose levels.  Discussed instructions for use.  We also discussed to start keeping track of the times of glucose checks, glucose logs handed to patient today.  We will plan to see her back in 6 weeks with her glucose logs.  She will call sooner if the Trulicity is too expensive or if she has intolerable side effects.

## 2018-05-23 NOTE — Assessment & Plan Note (Signed)
Last B12 level borderline too low. She is compliant to oral B12 daily.  Continue same. Repeat B12 levels pending.

## 2018-05-23 NOTE — Progress Notes (Signed)
Subjective:    Patient ID: Brandy [name], female    DOB: 01-11-1966, 53 y.o.   MRN: [id number]  HPI  Brandy [name] is a 53 year old female who presents today for follow up.  She is also overdue for a mammogram.  1) Type 2 Diabetes:  Current medications include: Metformin ER 1000 mg daily, Glipizide ER 10 mg daily. She ran out of her Glipizide 3-4 days ago as it is too costly.   She is checking her blood glucose 0-2 times daily and is getting readings of: She's not keep track of the times of her checks. Her sugars are running 220-240 on average.   Lowest reading 180 Highest reading low 300's   Last A1C: 11.3 in October 2019,  Last [place] Exam: Completed in October 2019 ACE/ARB: Lisinopril  Statin: atorvastatin   2) OSA/Sleep Disturbance: Underwent sleep study in early January 2020 and per her chart sleep apnea was not seen on her sleep study.   She continues to feel tired throughout the day. Feels like she needs to "crash" and will sleep for two-five hours at a time. She falls asleep in bed around 12:30 am, will wake up at 10 am-12 pm most everyday. No difficulty falling asleep, will wake up around 2 hours after falling asleep either having to use the bathroom, or is in pain. She will then have mind racing thoughts which keeps her from falling back asleep. She will be lying in bed awake from 2am-5am, then will fall back asleep and sleep until 10am-12pm.   She has had difficulty sleeping during the night for years.  Even when working.  3) Anxiety and Depression: Currently managed on Cymbalta 60 mg which was initiated several months ago. During her last visit she endorsed continued symptoms of anxiety but some improvement in chronic arthralgias and depression. She was on short term disability through her occupation for uncontrolled anxiety, she is scheduled to return on January 27th.   Since her last visit she's stopped taking her Cymbalta two days ago as she ran out. Her husband has  noticed improvement since she has been on Cymbalta including less irritability, less "snippy", improvement in overall depression. She hasn't noticed much of a change except for some improvement in joint aches. She doesn't feel as though her anxiety has improved.   She has an appointment to see [facility] with therapy on February 6th. She has been trying to get back into her normal sleep schedule in order to prepare for work.  She is feeling somewhat better overall but continues to struggle with sleeping, focus/concentrating, anxiety, managing stress. She has long term disability approved through her occupation, has also spoken with her supervisor.   4) B12 Deficiency: Currently managed on B12 orally for which she began several months ago.  She is compliant daily.  5) Arthralgias: Chronic for years. Currently taking OTC naproxen sodium x 2 tablets HS, and also Tylenol Arthritis in the middle of the night. Initiated on Cymbalta 60 mg several months ago and has noticed some improvement in aches. She is finding that she doesn't have to take Tylenol Arthritis at night.    She feels her pain to the entire body including the left shoulder which radiates across her upper back and to right shoulder. Also to bilateral hips, bilateral lower extremities, feet and toes. She does have chronic mid to lower back pain after standing for one minute at a time.  Her bilateral lower extremities will become weak where she feels  she will have to sit.  This limits her physical activity. She does not go shopping, she does not go anywhere where she has to stand. If she goes out in public she scopes out where the chairs are located, and if she has to stand then she will lean against the walls.   She is sedentary for most of her day.  Denies numbness/tingling.  Review of Systems  Constitutional: Positive for fatigue.  Respiratory: Negative for shortness of breath.   Cardiovascular: Negative for chest pain.  Musculoskeletal:  Positive for arthralgias, back pain and myalgias.  Neurological: Positive for weakness. Negative for numbness.  Psychiatric/Behavioral:       See HPI       Past Medical History:  Diagnosis Date  . Depression   . Dysmetabolic syndrome X   . Hypertension   . Hypertriglyceridemia   . Obesity   . Tetanus vaccine causing adverse effect in therapeutic use      Social History   Socioeconomic History  . Marital status: Married    Spouse name: Not on file  . Number of children: 3  . Years of education: Not on file  . Highest education level: Not on file  Occupational History  . Occupation: Government social research officerfinancial analyst    Employer: [employer]  Social Needs  . Financial resource strain: Not on file  . Food insecurity:    Worry: Not on file    Inability: Not on file  . Transportation needs:    Medical: Not on file    Non-medical: Not on file  Tobacco Use  . Smoking status: Never Smoker  . Smokeless tobacco: Never Used  Substance and Sexual Activity  . Alcohol use: Yes    Comment: occasional  . Drug use: No  . Sexual activity: Not on file  Lifestyle  . Physical activity:    Days per week: Not on file    Minutes per session: Not on file  . Stress: Not on file  Relationships  . Social connections:    Talks on phone: Not on file    Gets together: Not on file    Attends religious service: Not on file    Active member of club or organization: Not on file    Attends meetings of clubs or organizations: Not on file    Relationship status: Not on file  . Intimate partner violence:    Fear of current or ex partner: Not on file    Emotionally abused: Not on file    Physically abused: Not on file    Forced sexual activity: Not on file  Other Topics Concern  . Not on file  Social History Narrative   Best boyinancial analyst      Married; 3 kids (middle child-severely autistic)      No regular exercise    Past Surgical History:  Procedure Laterality Date  . BONE TUMOR EXCISION  1975    Right leg    Family History  Problem Relation Age of Onset  . Rheum arthritis Mother   . COPD Mother   . Hypertension Father   . Heart disease Maternal Grandmother   . Stroke Maternal Grandmother   . Alcohol abuse Maternal Grandmother   . Alcohol abuse Maternal Grandfather   . Aneurysm Paternal Grandmother   . Autism Son        severe    Allergies  Allergen Reactions  . Diphenhydramine Hcl     REACTION: minor rash  . Penicillins  REACTION: reaction as a child    Current Outpatient Medications on File Prior to Visit  Medication Sig Dispense Refill  . atorvastatin (LIPITOR) 10 MG tablet Take 1 tablet (10 mg total) by mouth daily. For cholesterol. 90 tablet 3  . cetirizine (ZYRTEC) 10 MG tablet Take 1 tablet (10 mg total) by mouth daily. 30 tablet 11  . hydrochlorothiazide (HYDRODIURIL) 12.5 MG tablet Take 1 tablet (12.5 mg total) by mouth daily. 90 tablet 0  . lisinopril (PRINIVIL,ZESTRIL) 20 MG tablet Take 1 tablet (20 mg total) by mouth daily. For blood pressure. 90 tablet 3  . metFORMIN (GLUCOPHAGE-XR) 500 MG 24 hr tablet Take 2 tablets (1,000 mg total) by mouth daily with breakfast. For diabetes. 180 tablet 1  . Vitamin D, Ergocalciferol, (DRISDOL) 50000 units CAPS capsule Take 1 capsule (50,000 Units total) by mouth every 7 (seven) days. 12 capsule 0   No current facility-administered medications on file prior to visit.     BP 122/80   Pulse 86   Temp 97.8 F (36.6 C) (Oral)   Ht 5\' 7"  (1.702 m)   Wt (!) 383 lb 8 oz (174 kg)   LMP 03/29/2018   SpO2 97%   BMI 60.06 kg/m    Objective:   Physical Exam  Constitutional: She appears well-nourished.  Neck: Neck supple.  Cardiovascular: Normal rate and regular rhythm.  Respiratory: Effort normal and breath sounds normal.  Musculoskeletal:       Back:       Legs:     Comments: Pain to generalized body as outlined in picture.  Tender upon palpation to left posterior shoulder, right posterior shoulder, bilateral  upper extremities including forearms, left mid lower lumbar spine.  Feeling weak after standing exam room for 30 seconds.  Skin: Skin is warm and dry.  Psychiatric: She has a normal mood and affect.           Assessment & Plan:

## 2018-05-23 NOTE — Assessment & Plan Note (Signed)
Continues.  Sleep study was negative for sleep apnea. Repeat thyroid testing and B12 levels pending. Recommended to adjust sleep schedule also start with regular physical activity.

## 2018-05-23 NOTE — Assessment & Plan Note (Signed)
Mostly generalized, cannot pinpoint the most bothersome location during visit today. She is tender throughout.  Differentials include osteoarthritis, fibromyalgia, unknown neurological condition.  Weakness to lower extremities is concerning.  Check plain films of the lumbar spine to start.  Consider neurology referral for ongoing weakness of lower extremities coupled with generalized pain.  She cannot stand in our exam room for longer than 30 seconds without feeling weak.  No obvious alarm signs.  We also discussed the importance of regular activity throughout the day and will start with 5-minute walks daily with gradual 1 to 2-minute time increases when ready.

## 2018-05-26 MED ORDER — LISINOPRIL-HYDROCHLOROTHIAZIDE 20-12.5 MG PO TABS: 1.0000 | ORAL_TABLET | Freq: Every day | ORAL | 3 refills | Status: DC

## 2018-05-27 ENCOUNTER — Other Ambulatory Visit: Payer: Self-pay | Admitting: Primary Care

## 2018-05-27 DIAGNOSIS — E119 Type 2 diabetes mellitus without complications: Secondary | ICD-10-CM

## 2018-05-28 DIAGNOSIS — M545 Low back pain, unspecified: Secondary | ICD-10-CM

## 2018-05-28 DIAGNOSIS — G8929 Other chronic pain: Secondary | ICD-10-CM

## 2018-05-28 DIAGNOSIS — R29898 Other symptoms and signs involving the musculoskeletal system: Secondary | ICD-10-CM

## 2018-05-29 ENCOUNTER — Telehealth: Payer: Self-pay | Admitting: Primary Care

## 2018-05-29 NOTE — Telephone Encounter (Signed)
[employer] faxed disability claims form to be filled out In kate's in box

## 2018-05-30 NOTE — Telephone Encounter (Signed)
Completed and placed on Robins desk. 

## 2018-06-04 NOTE — Telephone Encounter (Signed)
Paperwork faxed 2/3 °Pt aware °Copy for pt °Copy for scan °

## 2018-06-05 ENCOUNTER — Ambulatory Visit (INDEPENDENT_AMBULATORY_CARE_PROVIDER_SITE_OTHER): Payer: BLUE CROSS/BLUE SHIELD | Admitting: Psychology

## 2018-06-05 DIAGNOSIS — F3289 Other specified depressive episodes: Secondary | ICD-10-CM

## 2018-06-09 ENCOUNTER — Ambulatory Visit
Admission: RE | Admit: 2018-06-09 | Discharge: 2018-06-09 | Disposition: A | Discharge status: Home / Self Care | Payer: BLUE CROSS/BLUE SHIELD | Source: Ambulatory Visit | Attending: Primary Care | Admitting: Primary Care

## 2018-06-09 DIAGNOSIS — Z1239 Encounter for other screening for malignant neoplasm of breast: Secondary | ICD-10-CM | POA: Diagnosis not present

## 2018-06-09 DIAGNOSIS — Z1231 Encounter for screening mammogram for malignant neoplasm of breast: Secondary | ICD-10-CM | POA: Diagnosis not present

## 2018-06-16 ENCOUNTER — Telehealth: Payer: Self-pay | Admitting: *Deleted

## 2018-06-16 NOTE — Telephone Encounter (Signed)
As far as I'm concerned she is compliant to her medications, so this is news to me. Is she compliant? Please fax over office notes. Patient told me that she was now on long term disability and didn't have a return to work date. I think it would be best to notify the patient of this message from Marylene Land and ask the patient about a return to work date goal.   I will be seeing her on March 6th for follow up. Let me know.

## 2018-06-16 NOTE — Telephone Encounter (Signed)
Marylene Land Nurse Constant with [employer] left a voicemail stating that they received an attending physician's statement in their office on 06/02/18. Marylene Land started that patient has not been able to return to work for several months and wants to know when you think that she will be able to return? Marylene Land stated that patient has been non compliant with her medications and they need to know if she is back on her medications? Marylene Land stated that they did not get a copy of her last office visit notes.  Marylene Land needs to know if patient was prescribed medication at her last office visit?

## 2018-06-17 ENCOUNTER — Other Ambulatory Visit: Payer: Self-pay

## 2018-06-18 ENCOUNTER — Other Ambulatory Visit: Payer: Self-pay | Admitting: Primary Care

## 2018-06-18 DIAGNOSIS — F411 Generalized anxiety disorder: Secondary | ICD-10-CM

## 2018-06-18 DIAGNOSIS — F331 Major depressive disorder, recurrent, moderate: Secondary | ICD-10-CM

## 2018-06-18 NOTE — Telephone Encounter (Signed)
Noted  

## 2018-06-18 NOTE — Telephone Encounter (Signed)
Spoken and notified patient of Brandy [name] comments. Patient stated that she is compliant and will call [employer], will update if needed.

## 2018-06-20 NOTE — Telephone Encounter (Signed)
Laws, [place], LPN  Shanan Fitzpatrick, [place], CMA  [employer] 10/11/1965. Called to let you know that she spoke to [place] at [facility] and she does not need anymore information from [place]. Raman stated that she gave Marylene Land all of the information that she needed. I did not see anything in your in basket that I could document this on.  Rene Kocher

## 2018-06-22 ENCOUNTER — Other Ambulatory Visit: Payer: Self-pay | Admitting: Primary Care

## 2018-06-22 DIAGNOSIS — F331 Major depressive disorder, recurrent, moderate: Secondary | ICD-10-CM

## 2018-06-22 DIAGNOSIS — F411 Generalized anxiety disorder: Secondary | ICD-10-CM

## 2018-06-25 ENCOUNTER — Ambulatory Visit
Admission: RE | Admit: 2018-06-25 | Discharge: 2018-06-25 | Disposition: A | Discharge status: Home / Self Care | Payer: BLUE CROSS/BLUE SHIELD | Source: Ambulatory Visit | Attending: Primary Care | Admitting: Primary Care

## 2018-06-25 DIAGNOSIS — M48061 Spinal stenosis, lumbar region without neurogenic claudication: Secondary | ICD-10-CM | POA: Diagnosis not present

## 2018-06-25 DIAGNOSIS — M545 Low back pain, unspecified: Secondary | ICD-10-CM

## 2018-06-25 DIAGNOSIS — G8929 Other chronic pain: Secondary | ICD-10-CM

## 2018-06-25 DIAGNOSIS — R29898 Other symptoms and signs involving the musculoskeletal system: Secondary | ICD-10-CM

## 2018-06-26 ENCOUNTER — Ambulatory Visit (INDEPENDENT_AMBULATORY_CARE_PROVIDER_SITE_OTHER): Payer: BLUE CROSS/BLUE SHIELD | Admitting: Psychology

## 2018-06-26 DIAGNOSIS — F3289 Other specified depressive episodes: Secondary | ICD-10-CM

## 2018-07-04 ENCOUNTER — Ambulatory Visit (INDEPENDENT_AMBULATORY_CARE_PROVIDER_SITE_OTHER): Payer: BLUE CROSS/BLUE SHIELD | Admitting: Primary Care

## 2018-07-04 ENCOUNTER — Encounter: Payer: Self-pay | Admitting: Primary Care

## 2018-07-04 VITALS — BP 124/84 | HR 107 | Temp 97.8°F | Ht 67.0 in | Wt 374.2 lb

## 2018-07-04 DIAGNOSIS — E119 Type 2 diabetes mellitus without complications: Secondary | ICD-10-CM

## 2018-07-04 DIAGNOSIS — F411 Generalized anxiety disorder: Secondary | ICD-10-CM | POA: Diagnosis not present

## 2018-07-04 DIAGNOSIS — M5442 Lumbago with sciatica, left side: Secondary | ICD-10-CM

## 2018-07-04 DIAGNOSIS — F331 Major depressive disorder, recurrent, moderate: Secondary | ICD-10-CM

## 2018-07-04 DIAGNOSIS — E559 Vitamin D deficiency, unspecified: Secondary | ICD-10-CM

## 2018-07-04 DIAGNOSIS — M2550 Pain in unspecified joint: Secondary | ICD-10-CM | POA: Diagnosis not present

## 2018-07-04 DIAGNOSIS — I10 Essential (primary) hypertension: Secondary | ICD-10-CM

## 2018-07-04 DIAGNOSIS — G8929 Other chronic pain: Secondary | ICD-10-CM

## 2018-07-04 MED ORDER — DULAGLUTIDE 1.5 MG/0.5ML ~~LOC~~ SOAJ: SUBCUTANEOUS | 3 refills | Status: DC

## 2018-07-04 MED ORDER — BUSPIRONE HCL 10 MG PO TABS: 10.0000 mg | ORAL_TABLET | Freq: Two times a day (BID) | ORAL | 1 refills | Status: DC

## 2018-07-04 NOTE — Assessment & Plan Note (Signed)
Continued and no improvement since initiation of bupropion ER 150 mg.  Suspect bupropion to be contributing to tremor and will discontinue.  Continue duloxetine 60 mg, add buspirone 10 mg twice daily.  Follow-up in 6 weeks.

## 2018-07-04 NOTE — Assessment & Plan Note (Addendum)
Stable in the office today.  Continue to monitor. 

## 2018-07-04 NOTE — Patient Instructions (Signed)
We've increased the dose of your Trulicity to 1.5 mg. You may use two of your current 0.75 mg injections until your prescription is out. I sent a new prescription to your pharmacy.   Stop bupropion (Wellbutrin) daily for anxiety and depression. Start buspirone (Buspar) twice daily for anxiety.  You will be contacted regarding your referral to neurosurgery.  Please let us know if you have not been contacted within one week.   Avoid laying flat within 2 hours after eating to avoid belching and heartburn.  Continue to work on walking, increase as tolerated.  Continue to follow with [facility] for therapy.  Schedule a follow up visit for on or after April 24th. Robin, please schedule this for the end of a session.   It was a pleasure to see you today!

## 2018-07-04 NOTE — Assessment & Plan Note (Addendum)
Denies concerns for depression although does have symptoms of depression.  Will stop bupropion due to side effects.  Continue duloxetine.  Continue to work on anxiety control.

## 2018-07-04 NOTE — Assessment & Plan Note (Signed)
Compliant to vitamin D 50,000 units once weekly.  Continue same.  Repeat vitamin D next visit.

## 2018-07-04 NOTE — Assessment & Plan Note (Signed)
Continued although endorses and has endorsed slight improvement with Cymbalta.  Continue same.  Strongly advise she continue to get up daily and work on increasing her stamina and mobility.

## 2018-07-04 NOTE — Assessment & Plan Note (Signed)
Encouraged continued weight loss.  Suspect commendation of metformin and Trulicity to be contributing.

## 2018-07-04 NOTE — Assessment & Plan Note (Addendum)
MRI with evidence of bulging disks and foraminal stenosis to lumbar spine.  Referral placed to neurosurgery for further evaluation, especially given lower extremity weakness with left lower extremity sciatica.  No changes in bowel/bladder control.  No alarm signs today.

## 2018-07-04 NOTE — Assessment & Plan Note (Signed)
Glucose readings do appear to be improved on Trulicity 0.75 mg, not at goal.  Increase Trulicity to 1.5 mg weekly.  Suspect belching is a side effect of Trulicity and also due to laying flat within minutes after eating.  Discussed to continue to monitor glucose as she is currently doing.  Discussed to avoid laying flat within 2 hours after eating.  Continue metformin and glipizide as prescribed.  Follow-up in 6 weeks for repeat A1c.

## 2018-07-04 NOTE — Progress Notes (Signed)
Subjective:    Patient ID: Brandy [name], female    DOB: 05/07/1965, 53 y.o.   MRN: [id number]  HPI  Brandy [name] is a 53 year old female who presents today for follow up.  1) Type 2 Diabetes:  Current medications include: Metformin ER 1000 mg daily, Trulicity 0.75 mg weekly, glipizide 10 mg BID. She denies GI upset.  She has noticed some belching since starting Trulicity.  She is checking her blood glucose  times daily and is getting readings of: AM fasting: 190's 2 hours after dinner: 220's  Lowest reading: 171 Highest reading: 278  Last A1C: 8.8 in January 2020 Last Eye Exam: Due in October 2020 Pneumonia Vaccination:  ACE/ARB: lisinopril  Statin: atorvastatin    Wt Readings from Last 3 Encounters:  07/04/18 (!) 374 lb 4 oz (169.8 kg)  05/23/18 (!) 383 lb 8 oz (174 kg)  05/01/18 (!) 381 lb 8 oz (173 kg)     2) MDD/GAD: Currently managed on duloxetine 60 mg and bupropion ER 150 mg daily. During her last visit she endorsed continued symptoms of both anxiety and depression, some improvement in depression symptoms, on duloxetine 60 mg alone. It was decided that given persistent symptoms that we add bupropion ER 150 mg.  Since her last visit she's seen her therapist twice and thinks this is helpful. She doesn't feel any difference with her medications in terms of anxiety. She denies feeling depressed. She has a resting tremor to her bilateral hands which is chronic, since her last visit this has been much worse.  She denies SI/HI.  3) Chronic Pain: Chronic to entire body. During her last visit she endorsed moderate back pain with inability to walk for longer than several minutes, with inability to stand for longer than 30-60 seconds, and with bilateral lower extremity weakness and inability to hold up her body. She has to sit within several minutes of standing/walking or legs will give out. She does have intermittent tingling to the left lower extremity down to her foot.  She  underwent MRI of the lumbar spine on 06/26/18 given chronic lower extremity weakness and left lower extremity tingling.  MRI with mild disc bulging to L4-5 and L4 stenosis, disc bulging to L5-S1 with L5 foraminal stenosis. Given these results it was recommended she see neurosurgery for further evaluation.   Today she agrees to neurosurgery consult.  4) Belching: Belching "egg smelling" burps each day when she wakes from a laying position when sleeping. This began about 6 weeks ago which occurs daily. She does endorse laying down most every time she eats any meal. She has noticed infrequent esophageal burning to her chest.  She started Trulicity about 6 weeks ago.  5) Chronic Fatigue/OSA: Continues to feel her "crash" during the day. She slept 11 hours last night and now feels as though she could fall back asleep. She did wake during the night and was able to fall back asleep. She is scheduled to see pulmonology next week for sleep study.   Review of Systems  Constitutional: Positive for fatigue.  Respiratory: Negative for shortness of breath.   Cardiovascular: Negative for chest pain.  Gastrointestinal:       Belching  Musculoskeletal: Positive for arthralgias and back pain.  Neurological: Negative for dizziness and headaches.       Tingling to left lower extremity  Psychiatric/Behavioral:       See HPI        Past Medical History:  Diagnosis Date  .  Depression   . Dysmetabolic syndrome X   . Hypertension   . Hypertriglyceridemia   . Obesity   . Tetanus vaccine causing adverse effect in therapeutic use      Social History   Socioeconomic History  . Marital status: Married    Spouse name: Not on file  . Number of children: 3  . Years of education: Not on file  . Highest education level: Not on file  Occupational History  . Occupation: Government social research officer: [employer]  Social Needs  . Financial resource strain: Not on file  . Food insecurity:    Worry: Not on file     Inability: Not on file  . Transportation needs:    Medical: Not on file    Non-medical: Not on file  Tobacco Use  . Smoking status: Never Smoker  . Smokeless tobacco: Never Used  Substance and Sexual Activity  . Alcohol use: Yes    Comment: occasional  . Drug use: No  . Sexual activity: Not on file  Lifestyle  . Physical activity:    Days per week: Not on file    Minutes per session: Not on file  . Stress: Not on file  Relationships  . Social connections:    Talks on phone: Not on file    Gets together: Not on file    Attends religious service: Not on file    Active member of club or organization: Not on file    Attends meetings of clubs or organizations: Not on file    Relationship status: Not on file  . Intimate partner violence:    Fear of current or ex partner: Not on file    Emotionally abused: Not on file    Physically abused: Not on file    Forced sexual activity: Not on file  Other Topics Concern  . Not on file  Social History Narrative   Best boy      Married; 3 kids (middle child-severely autistic)      No regular exercise    Past Surgical History:  Procedure Laterality Date  . 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 Rheum arthritis Mother   . COPD Mother   . Hypertension Father   . Heart disease Maternal Grandmother   . Stroke Maternal Grandmother   . Alcohol abuse Maternal Grandmother   . Alcohol abuse Maternal Grandfather   . Aneurysm Paternal Grandmother   . Autism Son        severe    Allergies  Allergen Reactions  . Diphenhydramine Hcl     REACTION: minor rash  . Penicillins     REACTION: reaction as a child    Current Outpatient Medications on File Prior to Visit  Medication Sig Dispense Refill  . atorvastatin (LIPITOR) 10 MG tablet Take 1 tablet (10 mg total) by mouth daily. For cholesterol. 90 tablet 3  . cetirizine (ZYRTEC) 10 MG tablet Take 1 tablet (10 mg total)  by mouth daily. 30 tablet 11  . DULoxetine (CYMBALTA) 60 MG capsule Take 1 capsule (60 mg total) by mouth daily. For anxiety and depression. 90 capsule 3  . glipiZIDE (GLUCOTROL) 10 MG tablet Take 1 tablet (10 mg total) by mouth 2 (two) times daily before a meal. For diabetes. 180 tablet 3  . lisinopril-hydrochlorothiazide (ZESTORETIC) 20-12.5 MG tablet Take 1 tablet by mouth daily. For blood pressure. 90 tablet  3  . metFORMIN (GLUCOPHAGE-XR) 500 MG 24 hr tablet Take 2 tablets (1,000 mg total) by mouth daily with breakfast. For diabetes. 180 tablet 1  . Vitamin D, Ergocalciferol, (DRISDOL) 1.25 MG (50000 UT) CAPS capsule Take 1 capsule (50,000 Units total) by mouth every 7 (seven) days. 12 capsule 1   No current facility-administered medications on file prior to visit.     BP 124/84   Pulse (!) 107   Temp 97.8 F (36.6 C) (Oral)   Ht 5\' 7"  (1.702 m)   Wt (!) 374 lb 4 oz (169.8 kg)   LMP  (LMP Unknown) Comment: Last in 02/2018  SpO2 98%   BMI 58.62 kg/m    Objective:   Physical Exam  Constitutional: She appears well-nourished.  Neck: Neck supple.  Cardiovascular: Normal rate and regular rhythm.  Respiratory: Effort normal and breath sounds normal.  Skin: Skin is warm and dry.  Psychiatric: She has a normal mood and affect.           Assessment & Plan:

## 2018-07-10 ENCOUNTER — Other Ambulatory Visit: Payer: Self-pay

## 2018-07-10 ENCOUNTER — Ambulatory Visit (INDEPENDENT_AMBULATORY_CARE_PROVIDER_SITE_OTHER): Payer: BLUE CROSS/BLUE SHIELD | Admitting: Psychology

## 2018-07-10 DIAGNOSIS — F3289 Other specified depressive episodes: Secondary | ICD-10-CM

## 2018-07-11 NOTE — Progress Notes (Signed)
[facility]  Pulmonary Medicine Consultation      Assessment and Plan:  Excessive daytime sleepiness. - PSG 05/13/2018 negative for OSA, AHI. - Discussed negative sleep study.  Will look for other causes of daytime sleepiness, will test arterial blood gas, carboxyhemoglobin level. - She is asked to provide 3 weeks of sleep log data, then can consider further steps which may involve MSLT.  Depression, essential hypertension. - Sleep apnea can contribute to above conditions, therefore treatment of sleep apnea is an important part of their management.  Obesity. - BMI 61, discussed that weight loss would be beneficial.  Date: 07/11/2018  MRN# [id number] Brandy [name] [birth year]   Brandy [name] is a 53 y.o. old female seen in consultation for chief complaint of:    Chief Complaint  Patient presents with   Follow-up    pt denies any new concerns or sx today. c/o daytime sleepiness & restless sleep.     HPI:  The patient is a 53 year old female, last visit she was seen for symptoms of excessive daytime sleepiness, with trouble concentrating/focusing.  She was sent for home sleep study, however her sleep time was low at 136 minutes, sleep apnea was not seen with an AHI of 3.1.  Her BMI was noted to be 60, and weight loss was recommended.  Today she notes that she has continued to have sleep issues severe daytime sleepiness. Denies cataplexy, sleep paralysis, sleep walking. Her sleepiness preceeded starting any of her currently medications.   **TSH 05/23/18>> normal at 1.88 **PSG 05/13/2018>> AHI 3.1.  Inadequate sleep time of 136 minutes.   Patient is referred for symptoms of excessive daytime sleepiness.  She usually goes to bed at 11 PM, she wakes up several times per night, she goes to bathroom several times per night. Often has trouble falling back to sleep. She gets out of bed around 7:30 AM.  Her Epworth score is elevated at 16 today. She finds that she is getting tired during the  day. She is currently not working, but when she did she was getting very sleepy around 5 pm, and take a nap when she got home. But now she is getting sleepy earlier during the day and now she has to take naps at lunch time and sometimes have to sleep longer than an hour. She was having trouble focusing/concentrating.  She has now taken a few weeks off because of this and medical issues.   Medication:    Current Outpatient Medications:    atorvastatin (LIPITOR) 10 MG tablet, Take 1 tablet (10 mg total) by mouth daily. For cholesterol., Disp: 90 tablet, Rfl: 3   busPIRone (BUSPAR) 10 MG tablet, Take 1 tablet (10 mg total) by mouth 2 (two) times daily. For anxiety., Disp: 60 tablet, Rfl: 1   cetirizine (ZYRTEC) 10 MG tablet, Take 1 tablet (10 mg total) by mouth daily., Disp: 30 tablet, Rfl: 11   Dulaglutide (TRULICITY) 1.5 MG/0.5ML SOPN, Inject 1.5 mg into the skin once weekly for diabetes., Disp: 4 pen, Rfl: 3   DULoxetine (CYMBALTA) 60 MG capsule, Take 1 capsule (60 mg total) by mouth daily. For anxiety and depression., Disp: 90 capsule, Rfl: 3   glipiZIDE (GLUCOTROL) 10 MG tablet, Take 1 tablet (10 mg total) by mouth 2 (two) times daily before a meal. For diabetes., Disp: 180 tablet, Rfl: 3   lisinopril-hydrochlorothiazide (ZESTORETIC) 20-12.5 MG tablet, Take 1 tablet by mouth daily. For blood pressure., Disp: 90 tablet, Rfl: 3   metFORMIN (GLUCOPHAGE-XR) 500 MG  24 hr tablet, Take 2 tablets (1,000 mg total) by mouth daily with breakfast. For diabetes., Disp: 180 tablet, Rfl: 1   Vitamin D, Ergocalciferol, (DRISDOL) 1.25 MG (50000 UT) CAPS capsule, Take 1 capsule (50,000 Units total) by mouth every 7 (seven) days., Disp: 12 capsule, Rfl: 1   Allergies:  Diphenhydramine hcl and Penicillins  Review of Systems:  Constitutional: Feels well. Cardiovascular: Denies chest pain, exertional chest pain.  Pulmonary: Denies hemoptysis, pleuritic chest pain.   The remainder of systems were  reviewed and were found to be negative other than what is documented in the HPI.    Physical Examination:   VS: BP 124/74 (BP Location: Left Arm, Cuff Size: Normal)    Pulse 97    Ht 5\' 7"  (1.702 m)    Wt (!) 376 lb 6.4 oz (170.7 kg)    LMP  (LMP Unknown) Comment: Last in 02/2018   SpO2 96%    BMI 58.95 kg/m   General Appearance: No distress  Neuro:without focal findings, mental status, speech normal, alert and oriented HEENT: PERRLA, EOM intact Pulmonary: No wheezing, No rales  CardiovascularNormal S1,S2.  No m/r/g.  Abdomen: Benign, Soft, non-tender, No masses Renal:  No costovertebral tenderness  GU:  No performed at this time. Endoc: No evident thyromegaly, no signs of acromegaly or Cushing features Skin:   warm, no rashes, no ecchymosis  Extremities: normal, no cyanosis, clubbing.      LABORATORY PANEL:   CBC No results for input(s): WBC, HGB, HCT, PLT in the last 168 hours. ------------------------------------------------------------------------------------------------------------------  Chemistries  No results for input(s): NA, K, CL, CO2, GLUCOSE, BUN, CREATININE, CALCIUM, MG, AST, ALT, ALKPHOS, BILITOT in the last 168 hours.  Invalid input(s): GFRCGP ------------------------------------------------------------------------------------------------------------------  Cardiac Enzymes No results for input(s): TROPONINI in the last 168 hours. ------------------------------------------------------------  RADIOLOGY:  No results found.     Thank  you for the consultation and for allowing [facility] to assist in the care of your patient. Our recommendations are noted above.  Please contact us if we can be of further service.   Wells Guiles, M.D., F.C.C.P.  Board Certified in Internal Medicine, Pulmonary Medicine, Critical Care Medicine, and Sleep Medicine.  [facility] Office Number: [phone]   07/11/2018

## 2018-07-14 ENCOUNTER — Ambulatory Visit (INDEPENDENT_AMBULATORY_CARE_PROVIDER_SITE_OTHER): Payer: BLUE CROSS/BLUE SHIELD | Admitting: Internal Medicine

## 2018-07-14 ENCOUNTER — Encounter: Payer: Self-pay | Admitting: Internal Medicine

## 2018-07-14 ENCOUNTER — Other Ambulatory Visit: Payer: Self-pay

## 2018-07-14 VITALS — BP 124/74 | HR 97 | Ht 67.0 in | Wt 376.4 lb

## 2018-07-14 DIAGNOSIS — G4710 Hypersomnia, unspecified: Secondary | ICD-10-CM

## 2018-07-14 DIAGNOSIS — G4719 Other hypersomnia: Secondary | ICD-10-CM | POA: Diagnosis not present

## 2018-07-14 DIAGNOSIS — E662 Morbid (severe) obesity with alveolar hypoventilation: Secondary | ICD-10-CM | POA: Diagnosis not present

## 2018-07-14 NOTE — Patient Instructions (Signed)
Please download an app to track your sleep for the next 3 weeks and we can provide recommendations following that.  Will send for 2 separate types of blood test.

## 2018-07-15 ENCOUNTER — Ambulatory Visit: Payer: BLUE CROSS/BLUE SHIELD | Attending: Internal Medicine

## 2018-07-15 DIAGNOSIS — G4719 Other hypersomnia: Secondary | ICD-10-CM | POA: Diagnosis not present

## 2018-07-15 DIAGNOSIS — G4710 Hypersomnia, unspecified: Secondary | ICD-10-CM

## 2018-07-15 DIAGNOSIS — E662 Morbid (severe) obesity with alveolar hypoventilation: Secondary | ICD-10-CM | POA: Insufficient documentation

## 2018-07-15 LAB — COOXEMETRY PANEL
CARBOXYHEMOGLOBIN: 1.3 % (ref 0.5–1.5)
METHEMOGLOBIN: 1 % (ref 0.0–1.5)
O2 SAT: 97.9 %
Total hemoglobin: 13.4 g/dL (ref 12.0–16.0)

## 2018-07-15 LAB — BLOOD GAS, ARTERIAL
Acid-base deficit: 1.1 mmol/L (ref 0.0–2.0)
Bicarbonate: 22.4 mmol/L (ref 20.0–28.0)
FIO2: 0.21
O2 Saturation: 97.9 %
Patient temperature: 37
pCO2 arterial: 33 mmHg (ref 32.0–48.0)
pH, Arterial: 7.44 (ref 7.350–7.450)
pO2, Arterial: 99 mmHg (ref 83.0–108.0)

## 2018-07-17 ENCOUNTER — Ambulatory Visit (INDEPENDENT_AMBULATORY_CARE_PROVIDER_SITE_OTHER): Payer: BLUE CROSS/BLUE SHIELD | Admitting: Psychology

## 2018-07-17 ENCOUNTER — Other Ambulatory Visit: Payer: Self-pay

## 2018-07-17 DIAGNOSIS — F3289 Other specified depressive episodes: Secondary | ICD-10-CM | POA: Diagnosis not present

## 2018-07-24 DIAGNOSIS — M79605 Pain in left leg: Secondary | ICD-10-CM | POA: Diagnosis not present

## 2018-07-24 DIAGNOSIS — M545 Low back pain: Secondary | ICD-10-CM | POA: Diagnosis not present

## 2018-07-24 DIAGNOSIS — M79604 Pain in right leg: Secondary | ICD-10-CM | POA: Diagnosis not present

## 2018-07-24 DIAGNOSIS — G8929 Other chronic pain: Secondary | ICD-10-CM | POA: Diagnosis not present

## 2018-07-28 ENCOUNTER — Ambulatory Visit (INDEPENDENT_AMBULATORY_CARE_PROVIDER_SITE_OTHER): Payer: BLUE CROSS/BLUE SHIELD | Admitting: Psychology

## 2018-07-28 DIAGNOSIS — F3289 Other specified depressive episodes: Secondary | ICD-10-CM

## 2018-07-29 ENCOUNTER — Other Ambulatory Visit: Payer: Self-pay | Admitting: Primary Care

## 2018-07-29 DIAGNOSIS — M79605 Pain in left leg: Secondary | ICD-10-CM | POA: Diagnosis not present

## 2018-07-29 DIAGNOSIS — F411 Generalized anxiety disorder: Secondary | ICD-10-CM

## 2018-07-29 DIAGNOSIS — M25512 Pain in left shoulder: Secondary | ICD-10-CM | POA: Diagnosis not present

## 2018-07-29 DIAGNOSIS — M545 Low back pain: Secondary | ICD-10-CM | POA: Diagnosis not present

## 2018-07-29 DIAGNOSIS — M79604 Pain in right leg: Secondary | ICD-10-CM | POA: Diagnosis not present

## 2018-07-29 NOTE — Telephone Encounter (Signed)
Ok to change? Last prescribed on 07/04/2018. Last office visit on 07/04/2018. Next future appointment on 08/25/2018

## 2018-07-29 NOTE — Telephone Encounter (Signed)
Noted.  Change made.  

## 2018-07-30 DIAGNOSIS — M545 Low back pain: Secondary | ICD-10-CM | POA: Diagnosis not present

## 2018-07-30 DIAGNOSIS — M25512 Pain in left shoulder: Secondary | ICD-10-CM | POA: Diagnosis not present

## 2018-07-30 DIAGNOSIS — M79605 Pain in left leg: Secondary | ICD-10-CM | POA: Diagnosis not present

## 2018-07-30 DIAGNOSIS — M79604 Pain in right leg: Secondary | ICD-10-CM | POA: Diagnosis not present

## 2018-08-04 DIAGNOSIS — M79604 Pain in right leg: Secondary | ICD-10-CM | POA: Diagnosis not present

## 2018-08-04 DIAGNOSIS — M79605 Pain in left leg: Secondary | ICD-10-CM | POA: Diagnosis not present

## 2018-08-04 DIAGNOSIS — M25512 Pain in left shoulder: Secondary | ICD-10-CM | POA: Diagnosis not present

## 2018-08-04 DIAGNOSIS — M545 Low back pain: Secondary | ICD-10-CM | POA: Diagnosis not present

## 2018-08-06 ENCOUNTER — Ambulatory Visit (INDEPENDENT_AMBULATORY_CARE_PROVIDER_SITE_OTHER): Payer: BLUE CROSS/BLUE SHIELD | Admitting: Psychology

## 2018-08-06 DIAGNOSIS — M79604 Pain in right leg: Secondary | ICD-10-CM | POA: Diagnosis not present

## 2018-08-06 DIAGNOSIS — M545 Low back pain: Secondary | ICD-10-CM | POA: Diagnosis not present

## 2018-08-06 DIAGNOSIS — M25512 Pain in left shoulder: Secondary | ICD-10-CM | POA: Diagnosis not present

## 2018-08-06 DIAGNOSIS — F3289 Other specified depressive episodes: Secondary | ICD-10-CM

## 2018-08-06 DIAGNOSIS — M79605 Pain in left leg: Secondary | ICD-10-CM | POA: Diagnosis not present

## 2018-08-07 ENCOUNTER — Encounter (INDEPENDENT_AMBULATORY_CARE_PROVIDER_SITE_OTHER): Payer: Self-pay | Admitting: Vascular Surgery

## 2018-08-07 ENCOUNTER — Other Ambulatory Visit: Payer: Self-pay

## 2018-08-07 ENCOUNTER — Ambulatory Visit (INDEPENDENT_AMBULATORY_CARE_PROVIDER_SITE_OTHER): Payer: BLUE CROSS/BLUE SHIELD | Admitting: Vascular Surgery

## 2018-08-07 VITALS — BP 151/88 | HR 111 | Resp 17 | Ht 67.0 in | Wt 376.0 lb

## 2018-08-07 DIAGNOSIS — E119 Type 2 diabetes mellitus without complications: Secondary | ICD-10-CM

## 2018-08-07 DIAGNOSIS — Z7902 Long term (current) use of antithrombotics/antiplatelets: Secondary | ICD-10-CM

## 2018-08-07 DIAGNOSIS — M79604 Pain in right leg: Secondary | ICD-10-CM | POA: Diagnosis not present

## 2018-08-07 DIAGNOSIS — Z79899 Other long term (current) drug therapy: Secondary | ICD-10-CM

## 2018-08-07 DIAGNOSIS — M79605 Pain in left leg: Secondary | ICD-10-CM

## 2018-08-07 DIAGNOSIS — I872 Venous insufficiency (chronic) (peripheral): Secondary | ICD-10-CM

## 2018-08-07 DIAGNOSIS — I739 Peripheral vascular disease, unspecified: Secondary | ICD-10-CM

## 2018-08-07 DIAGNOSIS — Z791 Long term (current) use of non-steroidal anti-inflammatories (NSAID): Secondary | ICD-10-CM

## 2018-08-07 DIAGNOSIS — G8929 Other chronic pain: Secondary | ICD-10-CM

## 2018-08-07 DIAGNOSIS — M5442 Lumbago with sciatica, left side: Secondary | ICD-10-CM

## 2018-08-07 DIAGNOSIS — I10 Essential (primary) hypertension: Secondary | ICD-10-CM

## 2018-08-07 DIAGNOSIS — Z7984 Long term (current) use of oral hypoglycemic drugs: Secondary | ICD-10-CM

## 2018-08-07 NOTE — Progress Notes (Signed)
MRN : [id number]  Brandy [name] is a 53 y.o. (1965-05-17) female who presents with chief complaint of  Chief Complaint  Patient presents with   New Patient (Initial Visit)    ref Ernestina Penna bilateral leg pain  .  History of Present Illness:   The patient is seen for evaluation of painful lower extremities. Patient notes the pain is variable and not always associated with activity.  She states the pain is worse with standing and gets better with walking.  Also the pain is worse all night long.  The pain is somewhat consistent day to day occurring on most days. The patient notes the pain also occurs with standing and routinely seems worse as the day wears on. The pain has been progressive over the past several years. The patient states these symptoms are causing  a profound negative impact on quality of life and daily activities.  The patient denies rest pain or dangling of an extremity off the side of the bed during the night for relief. No open wounds or sores at this time. No history of DVT or phlebitis. No prior interventions or surgeries.  There is a  history of back problems and DJD of the lumbar and sacral spine.    Current Meds  Medication Sig   atorvastatin (LIPITOR) 10 MG tablet Take 1 tablet (10 mg total) by mouth daily. For cholesterol.   busPIRone (BUSPAR) 10 MG tablet TAKE 1 TABLET (10 MG TOTAL) BY MOUTH 2 (TWO) TIMES DAILY. FOR ANXIETY.   cetirizine (ZYRTEC) 10 MG tablet Take 1 tablet (10 mg total) by mouth daily.   Dulaglutide (TRULICITY) 1.5 MG/0.5ML SOPN Inject 1.5 mg into the skin once weekly for diabetes.   DULoxetine (CYMBALTA) 60 MG capsule Take 1 capsule (60 mg total) by mouth daily. For anxiety and depression.   gabapentin (NEURONTIN) 300 MG capsule Take by mouth.   glipiZIDE (GLUCOTROL) 10 MG tablet Take 1 tablet (10 mg total) by mouth 2 (two) times daily before a meal. For diabetes.   lisinopril-hydrochlorothiazide (ZESTORETIC) 20-12.5 MG tablet Take 1  tablet by mouth daily. For blood pressure.   metFORMIN (GLUCOPHAGE-XR) 500 MG 24 hr tablet Take 2 tablets (1,000 mg total) by mouth daily with breakfast. For diabetes.   Vitamin D, Ergocalciferol, (DRISDOL) 1.25 MG (50000 UT) CAPS capsule Take 1 capsule (50,000 Units total) by mouth every 7 (seven) days.    Past Medical History:  Diagnosis Date   Depression    Dysmetabolic syndrome X    Hypertension    Hypertriglyceridemia    Obesity    Tetanus vaccine causing adverse effect in therapeutic use     Past Surgical History:  Procedure Laterality Date   BONE TUMOR EXCISION  1975   Right leg    Social History Social History   Tobacco Use   Smoking status: Never Smoker   Smokeless tobacco: Never Used  Substance Use Topics   Alcohol use: Yes    Comment: occasional   Drug use: No    Family History Family History  Problem Relation Age of Onset   Rheum arthritis Mother    COPD Mother    Hypertension Father    Heart disease Maternal Grandmother    Stroke Maternal Grandmother    Alcohol abuse Maternal Grandmother    Alcohol abuse Maternal Grandfather    Aneurysm Paternal Grandmother    Autism Son        severe  No family history of bleeding/clotting disorders, porphyria or autoimmune  disease   Allergies  Allergen Reactions   Diphenhydramine Hcl     REACTION: minor rash   Penicillins     REACTION: reaction as a child     REVIEW OF SYSTEMS (Negative unless checked)  Constitutional: [] Weight loss  [] Fever  [] Chills Cardiac: [] Chest pain   [] Chest pressure   [] Palpitations   [] Shortness of breath when laying flat   [x] Shortness of breath with exertion. Vascular:  [x] Pain in legs with walking   [x] Pain in legs at rest  [] History of DVT   [] Phlebitis   [x] Swelling in legs   [] Varicose veins   [] Non-healing ulcers Pulmonary:   [] Uses home oxygen   [] Productive cough   [] Hemoptysis   [] Wheeze  [] COPD   [] Asthma Neurologic:  [] Dizziness   [] Seizures    [] History of stroke   [] History of TIA  [] Aphasia   [] Vissual changes   [] Weakness or numbness in arm   [] Weakness or numbness in leg Musculoskeletal:   [] Joint swelling   [x] Joint pain   [x] Low back pain Hematologic:  [] Easy bruising  [] Easy bleeding   [] Hypercoagulable state   [] Anemic Gastrointestinal:  [] Diarrhea   [] Vomiting  [] Gastroesophageal reflux/heartburn   [] Difficulty swallowing. Genitourinary:  [] Chronic kidney disease   [] Difficult urination  [] Frequent urination   [] Blood in urine Skin:  [] Rashes   [] Ulcers  Psychological:  [] History of anxiety   []  History of major depression.  Physical Examination  Vitals:   08/07/18 1004  BP: (!) 151/88  Pulse: (!) 111  Resp: 17  Weight: (!) 376 lb (170.6 kg)  Height: 5\' 7"  (1.702 m)   Body mass index is 58.89 kg/m. Gen: WD/WN, NAD morbidly obese Head: [place]/AT, No temporalis wasting.  Ear/Nose/Throat: Hearing grossly intact, nares w/o erythema or drainage, poor dentition Eyes: PER, EOMI, sclera nonicteric.  Neck: Supple, no masses.  No bruit or JVD.  Pulmonary:  Good air movement, clear to auscultation bilaterally, no use of accessory muscles.  Cardiac: RRR, normal S1, S2, no Murmurs. Vascular: scattered varicosities present bilaterally.  Moderate to severe venous stasis changes to the legs bilaterally.  4+ soft pitting edema Vessel Right Left  Radial Palpable Palpable  PT Not Palpable Not Palpable  DP Trace Palpable Not Palpable  Gastrointestinal: soft, non-distended. No guarding/no peritoneal signs.  Musculoskeletal: M/S 5/5 throughout.  No deformity or atrophy.  Neurologic: CN 2-12 intact. Pain and light touch intact in extremities.  Symmetrical.  Speech is fluent. Motor exam as listed above. Psychiatric: Judgment intact, Mood & affect appropriate for pt's clinical situation. Dermatologic: venous rashes no ulcers noted.  No changes consistent with cellulitis. Lymph : No Cervical lymphadenopathy, no lichenification or skin  changes of chronic lymphedema.  CBC Lab Results  Component Value Date   WBC 7.3 04/04/2018   HGB 13.0 04/04/2018   HCT 38.9 04/04/2018   MCV 87.9 04/04/2018   PLT 297.0 04/04/2018    BMET    Component Value Date/Time   NA 134 (L) 04/04/2018 1051   K 4.6 04/04/2018 1051   CL 98 04/04/2018 1051   CO2 27 04/04/2018 1051   GLUCOSE 219 (H) 04/04/2018 1051   BUN 15 04/04/2018 1051   CREATININE 0.64 04/04/2018 1051   CALCIUM 8.9 04/04/2018 1051   GFRNONAA 82.04 08/12/2009 1110   CrCl cannot be calculated (Patient's most recent lab result is older than the maximum 21 days allowed.).  COAG No results found for: INR, PROTIME  Radiology No results found.   Assessment/Plan 1. Pain in both lower  extremities  Recommend:  The patient has atypical pain symptoms for pure atherosclerotic disease. However, on physical exam there is evidence of mixed venous and arterial disease, given the diminished pulses and the edema associated with venous changes of the legs.  Noninvasive studies including ABI's and venous ultrasound of the legs will be obtained and the patient will follow up with me to review these studies.  The patient should continue walking and begin a more formal exercise program. The patient should continue his antiplatelet therapy and aggressive treatment of the lipid abnormalities.  The patient should begin wearing graduated compression socks 15-20 mmHg strength to control edema.   - VAS US ABI WITH/WO TBI; Future - VAS US LOWER EXTREMITY VENOUS REFLUX; Future  2. Chronic venous insufficiency  Recommend:  The patient has atypical pain symptoms for pure atherosclerotic disease. However, on physical exam there is evidence of mixed venous and arterial disease, given the diminished pulses and the edema associated with venous changes of the legs.  Noninvasive studies including ABI's and venous ultrasound of the legs will be obtained and the patient will follow up with me to  review these studies.  The patient should continue walking and begin a more formal exercise program. The patient should continue his antiplatelet therapy and aggressive treatment of the lipid abnormalities.  The patient should begin wearing graduated compression socks 15-20 mmHg strength to control edema.   - VAS US LOWER EXTREMITY VENOUS REFLUX; Future  3. PAD (peripheral artery disease) (HCC)  Recommend:  The patient has atypical pain symptoms for pure atherosclerotic disease. However, on physical exam there is evidence of mixed venous and arterial disease, given the diminished pulses and the edema associated with venous changes of the legs.  Noninvasive studies including ABI's and venous ultrasound of the legs will be obtained and the patient will follow up with me to review these studies.  The patient should continue walking and begin a more formal exercise program. The patient should continue his antiplatelet therapy and aggressive treatment of the lipid abnormalities.  The patient should begin wearing graduated compression socks 15-20 mmHg strength to control edema.   - VAS US ABI WITH/WO TBI; Future  4. Essential hypertension, benign Continue antihypertensive medications as already ordered, these medications have been reviewed and there are no changes at this time.   5. Type 2 diabetes mellitus without complication, without long-term current use of insulin (HCC) Continue hypoglycemic medications as already ordered, these medications have been reviewed and there are no changes at this time.  Hgb A1C to be monitored as already arranged by primary service   6. Chronic bilateral low back pain with left-sided sciatica Continue NSAID medications as already ordered, these medications have been reviewed and there are no changes at this time.  Continued activity and therapy was stressed.   Levora DredgeGregory Jawara Latorre, MD  08/07/2018 10:13 AM

## 2018-08-13 DIAGNOSIS — M25512 Pain in left shoulder: Secondary | ICD-10-CM | POA: Diagnosis not present

## 2018-08-13 DIAGNOSIS — M79605 Pain in left leg: Secondary | ICD-10-CM | POA: Diagnosis not present

## 2018-08-13 DIAGNOSIS — M545 Low back pain: Secondary | ICD-10-CM | POA: Diagnosis not present

## 2018-08-13 DIAGNOSIS — M79604 Pain in right leg: Secondary | ICD-10-CM | POA: Diagnosis not present

## 2018-08-14 ENCOUNTER — Ambulatory Visit (INDEPENDENT_AMBULATORY_CARE_PROVIDER_SITE_OTHER): Payer: BLUE CROSS/BLUE SHIELD | Admitting: Psychology

## 2018-08-14 DIAGNOSIS — F329 Major depressive disorder, single episode, unspecified: Secondary | ICD-10-CM | POA: Diagnosis not present

## 2018-08-18 ENCOUNTER — Other Ambulatory Visit: Payer: Self-pay | Admitting: Primary Care

## 2018-08-18 DIAGNOSIS — E119 Type 2 diabetes mellitus without complications: Secondary | ICD-10-CM

## 2018-08-19 DIAGNOSIS — M79605 Pain in left leg: Secondary | ICD-10-CM | POA: Diagnosis not present

## 2018-08-19 DIAGNOSIS — M25512 Pain in left shoulder: Secondary | ICD-10-CM | POA: Diagnosis not present

## 2018-08-19 DIAGNOSIS — M545 Low back pain: Secondary | ICD-10-CM | POA: Diagnosis not present

## 2018-08-19 DIAGNOSIS — M79604 Pain in right leg: Secondary | ICD-10-CM | POA: Diagnosis not present

## 2018-08-21 ENCOUNTER — Ambulatory Visit (INDEPENDENT_AMBULATORY_CARE_PROVIDER_SITE_OTHER): Payer: BLUE CROSS/BLUE SHIELD | Admitting: Psychology

## 2018-08-21 DIAGNOSIS — F3289 Other specified depressive episodes: Secondary | ICD-10-CM | POA: Diagnosis not present

## 2018-08-21 DIAGNOSIS — M79604 Pain in right leg: Secondary | ICD-10-CM | POA: Diagnosis not present

## 2018-08-21 DIAGNOSIS — M79605 Pain in left leg: Secondary | ICD-10-CM | POA: Diagnosis not present

## 2018-08-21 DIAGNOSIS — M25512 Pain in left shoulder: Secondary | ICD-10-CM | POA: Diagnosis not present

## 2018-08-21 DIAGNOSIS — M545 Low back pain: Secondary | ICD-10-CM | POA: Diagnosis not present

## 2018-08-23 ENCOUNTER — Other Ambulatory Visit: Payer: Self-pay | Admitting: Primary Care

## 2018-08-23 DIAGNOSIS — F331 Major depressive disorder, recurrent, moderate: Secondary | ICD-10-CM

## 2018-08-23 DIAGNOSIS — F411 Generalized anxiety disorder: Secondary | ICD-10-CM

## 2018-08-25 ENCOUNTER — Ambulatory Visit (INDEPENDENT_AMBULATORY_CARE_PROVIDER_SITE_OTHER): Payer: BLUE CROSS/BLUE SHIELD | Admitting: Primary Care

## 2018-08-25 ENCOUNTER — Other Ambulatory Visit: Payer: Self-pay

## 2018-08-25 DIAGNOSIS — I872 Venous insufficiency (chronic) (peripheral): Secondary | ICD-10-CM | POA: Diagnosis not present

## 2018-08-25 DIAGNOSIS — E559 Vitamin D deficiency, unspecified: Secondary | ICD-10-CM

## 2018-08-25 DIAGNOSIS — M5442 Lumbago with sciatica, left side: Secondary | ICD-10-CM

## 2018-08-25 DIAGNOSIS — E119 Type 2 diabetes mellitus without complications: Secondary | ICD-10-CM | POA: Diagnosis not present

## 2018-08-25 DIAGNOSIS — I739 Peripheral vascular disease, unspecified: Secondary | ICD-10-CM

## 2018-08-25 DIAGNOSIS — L719 Rosacea, unspecified: Secondary | ICD-10-CM | POA: Insufficient documentation

## 2018-08-25 DIAGNOSIS — R21 Rash and other nonspecific skin eruption: Secondary | ICD-10-CM

## 2018-08-25 DIAGNOSIS — F411 Generalized anxiety disorder: Secondary | ICD-10-CM

## 2018-08-25 DIAGNOSIS — M2550 Pain in unspecified joint: Secondary | ICD-10-CM

## 2018-08-25 DIAGNOSIS — G8929 Other chronic pain: Secondary | ICD-10-CM

## 2018-08-25 NOTE — Assessment & Plan Note (Addendum)
Active in water aerobics twice weekly, encouraged her to continue. Also encouraged walking daily. Continue Celebrex, gabapentin, and duloxetine.

## 2018-08-25 NOTE — Assessment & Plan Note (Signed)
Following with neurosurgery and vascular surgery.  Compliant with physical therapy with water aerobics, continue same. Continue gabapentin and celebrex.

## 2018-08-25 NOTE — Patient Instructions (Signed)
Call to schedule a lab only appointment for your convenience.  Continue your current medications as prescribed.  It is important that you improve your diet. Please limit carbohydrates in the form of white bread, rice, pasta, sweets, fast food, fried food, sugary drinks, etc. Increase your consumption of fresh fruits and vegetables, whole grains, lean protein.  Ensure you are consuming 64 ounces of water daily.  Continue with water aerobics twice weekly.  I will be in touch with your lab results and when to follow up. It was a pleasure to see you today! Mayra Reel, NP-C

## 2018-08-25 NOTE — Assessment & Plan Note (Signed)
Sounds to be secondary to intertrigo. Discussed to start by showering daily to every other day making sure to dry under the breasts and axilla. We also discussed the use of [place] barrier powder for which she will try.  Consider antifungal cream if no improvement. She will update.

## 2018-08-25 NOTE — Assessment & Plan Note (Signed)
Glucose readings above goal. Her diet has been poor since the start of Covid-19. She is having some difficulty tolerating Trulicity.  Repeat A1C pending for today. Discussed to continue current regimen as prescribed. We may have to initiate Lantus HS if A1C continues to remain above goal. She is agreeable to Lantus. If we do initiate Lantus then we can consider stopping Trulicity due to side effects.  Follow up depending on A1C result.

## 2018-08-25 NOTE — Assessment & Plan Note (Signed)
Following with vascular surgery and will undergo ultrasound and ABI's in a few weeks. She will be wearing compression socks.

## 2018-08-25 NOTE — Assessment & Plan Note (Signed)
Following with vascular surgery and will undergo ultrasound and ABI's in a few weeks. She will be wearing compression socks.   

## 2018-08-25 NOTE — Assessment & Plan Note (Signed)
Repeat vitamin D pending. Compliant to 50,000 units weekly. Consider refilling once labs return.

## 2018-08-25 NOTE — Assessment & Plan Note (Addendum)
Following with therapy and overall doing better but still with anxiety about returning to work.. Continue Cymbalta 60 mg daily and Buspar 10 mg twice daily. Continue weekly therapy. Denies SI/HI.

## 2018-08-25 NOTE — Progress Notes (Signed)
Subjective:    Patient ID: Brandy [name], female    DOB: 06/06/1965, 53 y.o.   MRN: [id number]  HPI  Virtual Visit via Video Note  I connected with Brandy [name] on 08/25/18 at 12:00 PM EDT by a video enabled telemedicine application and verified that I am speaking with the correct person using two identifiers.   I discussed the limitations of evaluation and management by telemedicine and the availability of in person appointments. The patient expressed understanding and agreed to proceed. She is at home, I am in the office.  We attempted to conduct a video visit initially but the video ultimately failed. We had to complete our visit via phone.    History of Present Illness:  [name] is a 53 year old female who presents today for follow up of chronic conditions.   1) Chronic Back Pain: Currently following with neurosurgery for chronic back pain with bilateral lower extremity weakness. She has undergone MRI of her lumbar spine which shows mild disc bulge with facet hypertrophy at L4-5, L4 stenosis, disc bulging to L5-S1, L5 stenosis. She is active in physical therapy by doing water aerobics one hour twice weekly. She is working to build up the muscles in her core and back.   She was referred to vascular surgery given lower extremity symptoms, evaluated by their team on 08/07/18. She is scheduled to undergo ABI's and lower extremity venous reflux ultrasound which is pending for May 11th. It was recommended she start to use compression socks with 15-20 mmHg to control edema and to start a regular exercise program. She was prescribed Celebrex for which she has been taking but without much improvement so she was provided with gabapentin. She is now taking both Celebrex 100 mg and gabapentin 300 mg TID and thinks she's noticed some improvement.   Follow up is scheduled for both neurosurgery and vascular in the future.  2) Vitamin D Deficiency: Currently managed on Vitamin D 50,000 unit  capsules once weekly. She is compliant weekly and is due for repeat labs.   3) Anxiety and Depression: Currently managed on duloxetine 60 mg and buspirone 10 mg BID. She was once on bupropion but this caused a tremor. She is compliant to therapy once weekly. Overall continues to feel anxious but feels as though she's making some progress.   4) Type 2 Diabetes:   Current medications include: Trulicity 1.5 mg weekly, Metformin ER 1000 mg daily, Glipizide 10 mg BID. She did miss two weeks of Trulicity due to some nausea, she plans on resuming this week.   She is checking her blood glucose 1-2 times daily and is getting readings of:  AM fasting: 210-220 2 hours after a meal: 260-320  Last A1C: 8.8 in January 2020, pending for today Last Eye Exam: Due in October 2020  Last Foot Exam: Due next visit  Pneumonia Vaccination: Due next visit  ACE/ARB: lisinopril  Statin: atorvastatin 10 mg  5) Rash: History of Intertrigo which is present to the axilla and breasts. She will experience sweating with a smelly/wet environment mostly under her breasts. She will shower twice weekly on average, she does wash under her breasts during showers.    Observations/Objective:  Alert and oriented. Appears well. No distress.  Assessment and Plan:  See problem based charting.  Follow Up Instructions:  Call to schedule a lab only appointment for your convenience.  Continue your current medications as prescribed.  It is important that you improve your diet. Please  limit carbohydrates in the form of white bread, rice, pasta, sweets, fast food, fried food, sugary drinks, etc. Increase your consumption of fresh fruits and vegetables, whole grains, lean protein.  Ensure you are consuming 64 ounces of water daily.  Continue with water aerobics twice weekly.  I will be in touch with your lab results and when to follow up. It was a pleasure to see you today! Mayra Reel, NP-C    I discussed the assessment  and treatment plan with the patient. The patient was provided an opportunity to ask questions and all were answered. The patient agreed with the plan and demonstrated an understanding of the instructions.   The patient was advised to call back or seek an in-person evaluation if the symptoms worsen or if the condition fails to improve as anticipated.     Doreene Nest, NP    Review of Systems  Constitutional: Positive for fatigue.  Respiratory: Negative for shortness of breath.   Cardiovascular: Negative for chest pain.  Musculoskeletal: Positive for arthralgias and back pain.  Neurological: Negative for dizziness.  Psychiatric/Behavioral: The patient is nervous/anxious.        See HPI       Past Medical History:  Diagnosis Date  . Depression   . Dysmetabolic syndrome X   . Hypertension   . Hypertriglyceridemia   . Obesity   . Tetanus vaccine causing adverse effect in therapeutic use      Social History   Socioeconomic History  . Marital status: Married    Spouse name: Not on file  . Number of children: 3  . Years of education: Not on file  . Highest education level: Not on file  Occupational History  . Occupation: Government social research officer: [employer]  Social Needs  . Financial resource strain: Not on file  . Food insecurity:    Worry: Not on file    Inability: Not on file  . Transportation needs:    Medical: Not on file    Non-medical: Not on file  Tobacco Use  . Smoking status: Never Smoker  . Smokeless tobacco: Never Used  Substance and Sexual Activity  . Alcohol use: Yes    Comment: occasional  . Drug use: No  . Sexual activity: Not on file  Lifestyle  . Physical activity:    Days per week: Not on file    Minutes per session: Not on file  . Stress: Not on file  Relationships  . Social connections:    Talks on phone: Not on file    Gets together: Not on file    Attends religious service: Not on file    Active member of club or  organization: Not on file    Attends meetings of clubs or organizations: Not on file    Relationship status: Not on file  . Intimate partner violence:    Fear of current or ex partner: Not on file    Emotionally abused: Not on file    Physically abused: Not on file    Forced sexual activity: Not on file  Other Topics Concern  . Not on file  Social History Narrative   Best boy      Married; 3 kids (middle child-severely autistic)      No regular exercise    Past Surgical History:  Procedure Laterality Date  . 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  Rheum arthritis Mother   . COPD Mother   . Hypertension Father   . Heart disease Maternal Grandmother   . Stroke Maternal Grandmother   . Alcohol abuse Maternal Grandmother   . Alcohol abuse Maternal Grandfather   . Aneurysm Paternal Grandmother   . Autism Son        severe    Allergies  Allergen Reactions  . Diphenhydramine Hcl     REACTION: minor rash  . Penicillins     REACTION: reaction as a child    Current Outpatient Medications on File Prior to Visit  Medication Sig Dispense Refill  . celecoxib (CELEBREX) 100 MG capsule Take 100 mg by mouth 2 (two) times daily.    Marland Kitchen gabapentin (NEURONTIN) 300 MG capsule Take 300 mg by mouth 3 (three) times daily.    Marland Kitchen atorvastatin (LIPITOR) 10 MG tablet Take 1 tablet (10 mg total) by mouth daily. For cholesterol. 90 tablet 3  . busPIRone (BUSPAR) 10 MG tablet TAKE 1 TABLET (10 MG TOTAL) BY MOUTH 2 (TWO) TIMES DAILY. FOR ANXIETY. 180 tablet 0  . cetirizine (ZYRTEC) 10 MG tablet Take 1 tablet (10 mg total) by mouth daily. 30 tablet 11  . Dulaglutide (TRULICITY) 1.5 MG/0.5ML SOPN Inject 1.5 mg into the skin once weekly for diabetes. 4 pen 3  . DULoxetine (CYMBALTA) 60 MG capsule Take 1 capsule (60 mg total) by mouth daily. For anxiety and depression. 90 capsule 3  . glipiZIDE (GLUCOTROL) 10 MG tablet Take 1 tablet (10 mg  total) by mouth 2 (two) times daily before a meal. For diabetes. 180 tablet 3  . lisinopril-hydrochlorothiazide (ZESTORETIC) 20-12.5 MG tablet Take 1 tablet by mouth daily. For blood pressure. 90 tablet 3  . metFORMIN (GLUCOPHAGE-XR) 500 MG 24 hr tablet TAKE 2 TABLETS (1,000 MG TOTAL) BY MOUTH DAILY WITH BREAKFAST. FOR DIABETES. 180 tablet 1  . Vitamin D, Ergocalciferol, (DRISDOL) 1.25 MG (50000 UT) CAPS capsule Take 1 capsule (50,000 Units total) by mouth every 7 (seven) days. 12 capsule 1   No current facility-administered medications on file prior to visit.     There were no vitals taken for this visit.   Objective:   Physical Exam  Constitutional: She is oriented to person, place, and time. She appears well-nourished.  Respiratory: Effort normal.  Neurological: She is alert and oriented to person, place, and time.  Skin: Skin is dry.  Psychiatric: She has a normal mood and affect.           Assessment & Plan:

## 2018-08-26 DIAGNOSIS — M79605 Pain in left leg: Secondary | ICD-10-CM | POA: Diagnosis not present

## 2018-08-26 DIAGNOSIS — M25512 Pain in left shoulder: Secondary | ICD-10-CM | POA: Diagnosis not present

## 2018-08-26 DIAGNOSIS — M79604 Pain in right leg: Secondary | ICD-10-CM | POA: Diagnosis not present

## 2018-08-26 DIAGNOSIS — M545 Low back pain: Secondary | ICD-10-CM | POA: Diagnosis not present

## 2018-08-28 ENCOUNTER — Ambulatory Visit (INDEPENDENT_AMBULATORY_CARE_PROVIDER_SITE_OTHER): Payer: BLUE CROSS/BLUE SHIELD | Admitting: Psychology

## 2018-08-28 DIAGNOSIS — F3289 Other specified depressive episodes: Secondary | ICD-10-CM

## 2018-08-28 DIAGNOSIS — M25512 Pain in left shoulder: Secondary | ICD-10-CM | POA: Diagnosis not present

## 2018-08-28 DIAGNOSIS — M79605 Pain in left leg: Secondary | ICD-10-CM | POA: Diagnosis not present

## 2018-08-28 DIAGNOSIS — M79604 Pain in right leg: Secondary | ICD-10-CM | POA: Diagnosis not present

## 2018-08-28 DIAGNOSIS — M545 Low back pain: Secondary | ICD-10-CM | POA: Diagnosis not present

## 2018-08-29 ENCOUNTER — Ambulatory Visit: Payer: BLUE CROSS/BLUE SHIELD | Admitting: *Deleted

## 2018-08-29 ENCOUNTER — Other Ambulatory Visit (INDEPENDENT_AMBULATORY_CARE_PROVIDER_SITE_OTHER): Payer: BLUE CROSS/BLUE SHIELD

## 2018-08-29 ENCOUNTER — Other Ambulatory Visit: Payer: Self-pay

## 2018-08-29 VITALS — BP 136/80 | HR 100

## 2018-08-29 DIAGNOSIS — I10 Essential (primary) hypertension: Secondary | ICD-10-CM

## 2018-08-29 DIAGNOSIS — E119 Type 2 diabetes mellitus without complications: Secondary | ICD-10-CM | POA: Diagnosis not present

## 2018-08-29 DIAGNOSIS — E559 Vitamin D deficiency, unspecified: Secondary | ICD-10-CM

## 2018-08-29 LAB — HEMOGLOBIN A1C: Hgb A1c MFr Bld: 8.5 % — ABNORMAL HIGH (ref 4.6–6.5)

## 2018-08-29 LAB — VITAMIN D 25 HYDROXY (VIT D DEFICIENCY, FRACTURES): VITD: 21.78 ng/mL — ABNORMAL LOW (ref 30.00–100.00)

## 2018-08-29 NOTE — Progress Notes (Signed)
Per Graylon Gunning encounter order on 08/25/2018, patient presents today for a nurse visit blood pressure check for ongoing follow up and management.  Vital Sign Readings today 136/80.

## 2018-09-01 DIAGNOSIS — E119 Type 2 diabetes mellitus without complications: Secondary | ICD-10-CM

## 2018-09-01 MED ORDER — PEN NEEDLES 31G X 6 MM MISC: 2 refills | Status: DC

## 2018-09-01 MED ORDER — INSULIN GLARGINE 100 UNIT/ML SOLOSTAR PEN: 10.0000 [IU] | PEN_INJECTOR | Freq: Every day | SUBCUTANEOUS | 2 refills | Status: DC

## 2018-09-01 NOTE — Telephone Encounter (Signed)
DR what sleep app do you recommend to track sleep?

## 2018-09-02 DIAGNOSIS — M25512 Pain in left shoulder: Secondary | ICD-10-CM | POA: Diagnosis not present

## 2018-09-02 DIAGNOSIS — M79605 Pain in left leg: Secondary | ICD-10-CM | POA: Diagnosis not present

## 2018-09-02 DIAGNOSIS — M79604 Pain in right leg: Secondary | ICD-10-CM | POA: Diagnosis not present

## 2018-09-02 DIAGNOSIS — M545 Low back pain: Secondary | ICD-10-CM | POA: Diagnosis not present

## 2018-09-04 ENCOUNTER — Other Ambulatory Visit: Payer: Self-pay | Admitting: Primary Care

## 2018-09-04 ENCOUNTER — Ambulatory Visit (INDEPENDENT_AMBULATORY_CARE_PROVIDER_SITE_OTHER): Payer: BLUE CROSS/BLUE SHIELD | Admitting: Psychology

## 2018-09-04 DIAGNOSIS — E119 Type 2 diabetes mellitus without complications: Secondary | ICD-10-CM

## 2018-09-04 DIAGNOSIS — F3289 Other specified depressive episodes: Secondary | ICD-10-CM | POA: Diagnosis not present

## 2018-09-05 NOTE — Telephone Encounter (Signed)
Please advise. Lantus is not covered.

## 2018-09-05 NOTE — Telephone Encounter (Signed)
Noted, medication changed to [employer].

## 2018-09-08 ENCOUNTER — Ambulatory Visit (INDEPENDENT_AMBULATORY_CARE_PROVIDER_SITE_OTHER): Payer: BLUE CROSS/BLUE SHIELD | Admitting: Nurse Practitioner

## 2018-09-08 ENCOUNTER — Ambulatory Visit (INDEPENDENT_AMBULATORY_CARE_PROVIDER_SITE_OTHER): Payer: BLUE CROSS/BLUE SHIELD

## 2018-09-08 ENCOUNTER — Other Ambulatory Visit: Payer: Self-pay

## 2018-09-08 ENCOUNTER — Encounter (INDEPENDENT_AMBULATORY_CARE_PROVIDER_SITE_OTHER): Payer: Self-pay | Admitting: Nurse Practitioner

## 2018-09-08 VITALS — BP 161/100 | HR 92 | Resp 18 | Wt 377.0 lb

## 2018-09-08 DIAGNOSIS — M79604 Pain in right leg: Secondary | ICD-10-CM | POA: Diagnosis not present

## 2018-09-08 DIAGNOSIS — M79605 Pain in left leg: Secondary | ICD-10-CM

## 2018-09-08 DIAGNOSIS — Z79899 Other long term (current) drug therapy: Secondary | ICD-10-CM

## 2018-09-08 DIAGNOSIS — I10 Essential (primary) hypertension: Secondary | ICD-10-CM

## 2018-09-08 DIAGNOSIS — I872 Venous insufficiency (chronic) (peripheral): Secondary | ICD-10-CM | POA: Diagnosis not present

## 2018-09-08 DIAGNOSIS — I739 Peripheral vascular disease, unspecified: Secondary | ICD-10-CM

## 2018-09-08 NOTE — Progress Notes (Signed)
SUBJECTIVE:  Patient ID: Brandy [name], female    DOB: 05/13/1965, 53 y.o.   MRN: [id number] Chief Complaint  Patient presents with  . Follow-up    ultrasound follow up    HPI  Brandy [name] is a 53 y.o. female that presents with pain in her bilateral lower extremities.  The patient states that this pain is greatest when she is just standing still and 1 place.  She also has significant pain during the evenings when she is sleeping.  She is able to sleep comfortably and it is extensively painful.  She denies any claudication-like symptoms.  She denies any pain consistent with rest pain.  She endorses some shortness of breath with exertion.  She also endorses swelling of her lower extremities throughout the day.  She denies any fevers, chills, nausea, vomiting or diarrhea.  She denies any chest pain or shortness of breath.  Today the patient underwent bilateral ABIs today which revealed an ABI of 1.03 on her right and 1.  10 on her left.  The patient has strong triphasic waveforms in the bilateral tibial arteries.  She also has strong bilateral toe waveforms.  The patient underwent a lower extremity venous reflux study which revealed no evidence of DVT bilaterally.  No evidence of superficial venous thrombosis.  The left lower extremity had reflux in the left saphenofemoral junction.  Past Medical History:  Diagnosis Date  . Depression   . Dysmetabolic syndrome X   . Hypertension   . Hypertriglyceridemia   . Obesity   . Tetanus vaccine causing adverse effect in therapeutic use     Past Surgical History:  Procedure Laterality Date  . 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 Marital status: Married    Spouse name: Not on file  . Number of children: 3  . Years of education: Not on file  . Highest education level: Not on file  Occupational History  . Occupation: Government social research officer: [employer]  Social Needs  . Financial  resource strain: Not on file  . Food insecurity:    Worry: Not on file    Inability: Not on file  . Transportation needs:    Medical: Not on file    Non-medical: Not on file  Tobacco Use  . Smoking status: Never Smoker  . Smokeless tobacco: Never Used  Substance and Sexual Activity  . Alcohol use: Yes    Comment: occasional  . Drug use: No  . Sexual activity: Not on file  Lifestyle  . Physical activity:    Days per week: Not on file    Minutes per session: Not on file  . Stress: Not on file  Relationships  . Social connections:    Talks on phone: Not on file    Gets together: Not on file    Attends religious service: Not on file    Active member of club or organization: Not on file    Attends meetings of clubs or organizations: Not on file    Relationship status: Not on file  . Intimate partner violence:    Fear of current or ex partner: Not on file    Emotionally abused: Not on file    Physically abused: Not on file    Forced sexual activity: Not on file  Other Topics Concern  . Not on file  Social History Narrative   Best boy  Married; 3 kids (middle child-severely autistic)      No regular exercise    Family History  Problem Relation Age of Onset  . Rheum arthritis Mother   . COPD Mother   . Hypertension Father   . Heart disease Maternal Grandmother   . Stroke Maternal Grandmother   . Alcohol abuse Maternal Grandmother   . Alcohol abuse Maternal Grandfather   . Aneurysm Paternal Grandmother   . Autism Son        severe    Allergies  Allergen Reactions  . Diphenhydramine Hcl     REACTION: minor rash  . Penicillins     REACTION: reaction as a child     Review of Systems   Review of Systems: Negative Unless Checked Constitutional: Weight loss  Fever  Chills Cardiac: Chest pain    Atrial Fibrillation  Palpitations   Shortness of breath when laying flat   Shortness of breath with exertion. Shortness of breath at rest  Vascular:  Pain in legs with walking   Pain in legs with standing Pain in legs when laying flat   Claudication    Pain in feet when laying flat    History of DVT   Phlebitis   Swelling in legs   Varicose veins   Non-healing ulcers Pulmonary:   Uses home oxygen   Productive cough   Hemoptysis   Wheeze  COPD   Asthma Neurologic:  Dizziness   Seizures  Blackouts History of stroke   History of TIA  Aphasia   Temporary Blindness   Weakness or numbness in arm   Weakness or numbness in leg Musculoskeletal:   Joint swelling   Joint pain   Low back pain   History of Knee Replacement Arthritis back Surgeries   Spinal Stenosis    Hematologic:  Easy bruising  Easy bleeding   Hypercoagulable state   Anemic Gastrointestinal:  Diarrhea   Vomiting  Gastroesophageal reflux/heartburn   Difficulty swallowing. Abdominal pain Genitourinary:  Chronic kidney disease   Difficult urination  Anuric   Blood in urine Frequent urination  Burning with urination   Hematuria Skin:  Rashes   Ulcers Wounds Psychological:  History of anxiety    History of major depression   Memory Difficulties      OBJECTIVE:   Physical Exam  BP (!) 161/100 (BP Location: Right Arm)   Pulse 92   Resp 18   Wt (!) 377 lb (171 kg)   BMI 59.05 kg/m   Gen: WD/WN, NAD Head: [place]/AT, No temporalis wasting.  Ear/Nose/Throat: Hearing grossly intact, nares w/o erythema or drainage Eyes: PER, EOMI, sclera nonicteric.  Neck: Supple, no masses.  No JVD.  Pulmonary:  Good air movement, no use of accessory muscles.  Cardiac: RRR Vascular:  2+ soft edema bilaterally.  Scattered varicosities present bilaterally Vessel Right Left  Radial Palpable Palpable  Dorsalis Pedis Not Palpable Not Palpable  Posterior Tibial Not Palpable Not Palpable   Gastrointestinal: soft, non-distended. No guarding/no peritoneal signs.  Musculoskeletal: M/S 5/5  throughout.  No deformity or atrophy.  Neurologic: Pain and light touch intact in extremities.  Symmetrical.  Speech is fluent. Motor exam as listed above. Psychiatric: Judgment intact, Mood & affect appropriate for pt's clinical situation. Dermatologic:  Slight stasis dermatitis. No changes consistent with cellulitis.  Thickening present bilaterally Lymph : No Cervical lymphadenopathy, no lichenification or skin changes of chronic lymphedema.       ASSESSMENT AND PLAN:  1. PAD (peripheral artery disease) (HCC) Today  the patient underwent bilateral ABIs today which revealed an ABI of 1.03 on her right and 1.  10 on her left.  The patient has strong triphasic waveforms in the bilateral tibial arteries.  She also has strong bilateral toe waveforms.  Despite noninvasive studies today the patient does not have any clinical manifestation of peripheral artery disease that would account for her pain issues with her lower extremities.  2. Chronic venous insufficiency The patient underwent a lower extremity venous reflux study which revealed no evidence of DVT bilaterally.  No evidence of superficial venous thrombosis.  The left lower extremity had reflux in the left saphenofemoral junction.  Patient has some chronic venous insufficiency within her saphenofemoral junction which can account for some swelling.  I have discussed with the patient about medical grade 1 compression therapy.  She should wear compression socks first thing in the morning and remove them in the evening.  I have recommended a gradient of 20 to 30 mmHg.  These should be kneehighs.  She is also to elevate her extremities as much as possible as well as exercise 30 minutes a day for at least 5 days a week.  The patient should also utilize NSAIDs or Tylenol for pain relief.  Patient will follow-up in 3 months to determine her effectiveness with conservative therapy. 3. Essential hypertension, benign Continue antihypertensive medications  as already ordered, these medications have been reviewed and there are no changes at this time.   4. Pain in both lower extremities Recommend:  I do not find evidence of Vascular pathology that would explain the patient's symptoms  The patient has atypical pain symptoms for vascular disease  Noninvasive studies including venous ultrasound of the legs do not identify vascular problems  The patient should continue walking and begin a more formal exercise program. The patient should continue his antiplatelet therapy and aggressive treatment of the lipid abnormalities. The patient should begin wearing graduated compression socks 15-20 mmHg strength to control her mild edema.  Patient will follow-up with me on a PRN basis  Further work-up of her lower extremity pain is deferred to the primary service      Current Outpatient Medications on File Prior to Visit  Medication Sig Dispense Refill  . atorvastatin (LIPITOR) 10 MG tablet Take 1 tablet (10 mg total) by mouth daily. For cholesterol. 90 tablet 3  . busPIRone (BUSPAR) 10 MG tablet TAKE 1 TABLET (10 MG TOTAL) BY MOUTH 2 (TWO) TIMES DAILY. FOR ANXIETY. 180 tablet 0  . celecoxib (CELEBREX) 100 MG capsule Take 100 mg by mouth 2 (two) times daily.    . cetirizine (ZYRTEC) 10 MG tablet Take 1 tablet (10 mg total) by mouth daily. 30 tablet 11  . Dulaglutide (TRULICITY) 1.5 MG/0.5ML SOPN Inject 1.5 mg into the skin once weekly for diabetes. 4 pen 3  . DULoxetine (CYMBALTA) 60 MG capsule Take 1 capsule (60 mg total) by mouth daily. For anxiety and depression. 90 capsule 3  . gabapentin (NEURONTIN) 300 MG capsule Take 300 mg by mouth 3 (three) times daily.    Marland Kitchen. glipiZIDE (GLUCOTROL) 10 MG tablet Take 1 tablet (10 mg total) by mouth 2 (two) times daily before a meal. For diabetes. 180 tablet 3  . Insulin Glargine (BASAGLAR KWIKPEN) 100 UNIT/ML SOPN Inject 0.1 mLs (10 Units total) into the skin at bedtime. 15 mL 2  . Insulin Pen Needle (PEN  NEEDLES) 31G X 6 MM MISC Use nightly with insulin. 100 each 2  . lisinopril-hydrochlorothiazide (ZESTORETIC)  20-12.5 MG tablet Take 1 tablet by mouth daily. For blood pressure. 90 tablet 3  . metFORMIN (GLUCOPHAGE-XR) 500 MG 24 hr tablet TAKE 2 TABLETS (1,000 MG TOTAL) BY MOUTH DAILY WITH BREAKFAST. FOR DIABETES. 180 tablet 1  . Vitamin D, Ergocalciferol, (DRISDOL) 1.25 MG (50000 UT) CAPS capsule Take 1 capsule (50,000 Units total) by mouth every 7 (seven) days. 12 capsule 1   No current facility-administered medications on file prior to visit.     There are no Patient Instructions on file for this visit. No follow-ups on file.   Georgiana Spinner, NP  This note was completed with Office manager.  Any errors are purely unintentional.

## 2018-09-09 DIAGNOSIS — M79604 Pain in right leg: Secondary | ICD-10-CM | POA: Diagnosis not present

## 2018-09-09 DIAGNOSIS — M25512 Pain in left shoulder: Secondary | ICD-10-CM | POA: Diagnosis not present

## 2018-09-09 DIAGNOSIS — M79605 Pain in left leg: Secondary | ICD-10-CM | POA: Diagnosis not present

## 2018-09-09 DIAGNOSIS — M545 Low back pain: Secondary | ICD-10-CM | POA: Diagnosis not present

## 2018-09-11 ENCOUNTER — Ambulatory Visit (INDEPENDENT_AMBULATORY_CARE_PROVIDER_SITE_OTHER): Payer: BLUE CROSS/BLUE SHIELD | Admitting: Psychology

## 2018-09-11 DIAGNOSIS — F3289 Other specified depressive episodes: Secondary | ICD-10-CM

## 2018-09-11 DIAGNOSIS — M25512 Pain in left shoulder: Secondary | ICD-10-CM | POA: Diagnosis not present

## 2018-09-11 DIAGNOSIS — M545 Low back pain: Secondary | ICD-10-CM | POA: Diagnosis not present

## 2018-09-11 DIAGNOSIS — M79605 Pain in left leg: Secondary | ICD-10-CM | POA: Diagnosis not present

## 2018-09-11 DIAGNOSIS — M79604 Pain in right leg: Secondary | ICD-10-CM | POA: Diagnosis not present

## 2018-09-16 DIAGNOSIS — M25512 Pain in left shoulder: Secondary | ICD-10-CM | POA: Diagnosis not present

## 2018-09-16 DIAGNOSIS — M545 Low back pain: Secondary | ICD-10-CM | POA: Diagnosis not present

## 2018-09-16 DIAGNOSIS — M79604 Pain in right leg: Secondary | ICD-10-CM | POA: Diagnosis not present

## 2018-09-16 DIAGNOSIS — M79605 Pain in left leg: Secondary | ICD-10-CM | POA: Diagnosis not present

## 2018-09-18 ENCOUNTER — Ambulatory Visit (INDEPENDENT_AMBULATORY_CARE_PROVIDER_SITE_OTHER): Payer: BLUE CROSS/BLUE SHIELD | Admitting: Psychology

## 2018-09-18 DIAGNOSIS — M545 Low back pain: Secondary | ICD-10-CM | POA: Diagnosis not present

## 2018-09-18 DIAGNOSIS — M79604 Pain in right leg: Secondary | ICD-10-CM | POA: Diagnosis not present

## 2018-09-18 DIAGNOSIS — M25512 Pain in left shoulder: Secondary | ICD-10-CM | POA: Diagnosis not present

## 2018-09-18 DIAGNOSIS — F3289 Other specified depressive episodes: Secondary | ICD-10-CM

## 2018-09-18 DIAGNOSIS — M79605 Pain in left leg: Secondary | ICD-10-CM | POA: Diagnosis not present

## 2018-09-23 DIAGNOSIS — M545 Low back pain: Secondary | ICD-10-CM | POA: Diagnosis not present

## 2018-09-23 DIAGNOSIS — M25512 Pain in left shoulder: Secondary | ICD-10-CM | POA: Diagnosis not present

## 2018-09-23 DIAGNOSIS — M79604 Pain in right leg: Secondary | ICD-10-CM | POA: Diagnosis not present

## 2018-09-23 DIAGNOSIS — M79605 Pain in left leg: Secondary | ICD-10-CM | POA: Diagnosis not present

## 2018-09-25 ENCOUNTER — Ambulatory Visit (INDEPENDENT_AMBULATORY_CARE_PROVIDER_SITE_OTHER): Payer: BLUE CROSS/BLUE SHIELD | Admitting: Psychology

## 2018-09-25 DIAGNOSIS — M545 Low back pain: Secondary | ICD-10-CM | POA: Diagnosis not present

## 2018-09-25 DIAGNOSIS — F3289 Other specified depressive episodes: Secondary | ICD-10-CM

## 2018-09-25 DIAGNOSIS — M79604 Pain in right leg: Secondary | ICD-10-CM | POA: Diagnosis not present

## 2018-09-25 DIAGNOSIS — M79605 Pain in left leg: Secondary | ICD-10-CM | POA: Diagnosis not present

## 2018-09-25 DIAGNOSIS — M25512 Pain in left shoulder: Secondary | ICD-10-CM | POA: Diagnosis not present

## 2018-09-29 ENCOUNTER — Telehealth: Payer: Self-pay

## 2018-09-29 NOTE — Telephone Encounter (Signed)
Received disability paperwork from [employer] [facility]. Faxed to [employer] with confirmation.

## 2018-09-30 DIAGNOSIS — M25512 Pain in left shoulder: Secondary | ICD-10-CM | POA: Diagnosis not present

## 2018-09-30 DIAGNOSIS — M545 Low back pain: Secondary | ICD-10-CM | POA: Diagnosis not present

## 2018-09-30 DIAGNOSIS — M79604 Pain in right leg: Secondary | ICD-10-CM | POA: Diagnosis not present

## 2018-09-30 DIAGNOSIS — M79605 Pain in left leg: Secondary | ICD-10-CM | POA: Diagnosis not present

## 2018-10-02 ENCOUNTER — Ambulatory Visit (INDEPENDENT_AMBULATORY_CARE_PROVIDER_SITE_OTHER): Payer: BC Managed Care – PPO | Admitting: Psychology

## 2018-10-02 DIAGNOSIS — M79605 Pain in left leg: Secondary | ICD-10-CM | POA: Diagnosis not present

## 2018-10-02 DIAGNOSIS — M79604 Pain in right leg: Secondary | ICD-10-CM | POA: Diagnosis not present

## 2018-10-02 DIAGNOSIS — F3289 Other specified depressive episodes: Secondary | ICD-10-CM

## 2018-10-02 DIAGNOSIS — M25512 Pain in left shoulder: Secondary | ICD-10-CM | POA: Diagnosis not present

## 2018-10-02 DIAGNOSIS — M545 Low back pain: Secondary | ICD-10-CM | POA: Diagnosis not present

## 2018-10-09 ENCOUNTER — Telehealth: Payer: Self-pay | Admitting: Primary Care

## 2018-10-09 ENCOUNTER — Ambulatory Visit (INDEPENDENT_AMBULATORY_CARE_PROVIDER_SITE_OTHER): Payer: BC Managed Care – PPO | Admitting: Psychology

## 2018-10-09 DIAGNOSIS — M25512 Pain in left shoulder: Secondary | ICD-10-CM | POA: Diagnosis not present

## 2018-10-09 DIAGNOSIS — F3289 Other specified depressive episodes: Secondary | ICD-10-CM | POA: Diagnosis not present

## 2018-10-09 DIAGNOSIS — M545 Low back pain: Secondary | ICD-10-CM | POA: Diagnosis not present

## 2018-10-09 DIAGNOSIS — M79605 Pain in left leg: Secondary | ICD-10-CM | POA: Diagnosis not present

## 2018-10-09 DIAGNOSIS — M79604 Pain in right leg: Secondary | ICD-10-CM | POA: Diagnosis not present

## 2018-10-09 NOTE — Telephone Encounter (Signed)
[employer] paperwork on cart to be delivered by carrie

## 2018-10-10 NOTE — Telephone Encounter (Signed)
Completed and placed on Robins desk. 

## 2018-10-14 DIAGNOSIS — M79604 Pain in right leg: Secondary | ICD-10-CM | POA: Diagnosis not present

## 2018-10-14 DIAGNOSIS — M79605 Pain in left leg: Secondary | ICD-10-CM | POA: Diagnosis not present

## 2018-10-14 DIAGNOSIS — M545 Low back pain: Secondary | ICD-10-CM | POA: Diagnosis not present

## 2018-10-14 DIAGNOSIS — M25512 Pain in left shoulder: Secondary | ICD-10-CM | POA: Diagnosis not present

## 2018-10-15 ENCOUNTER — Other Ambulatory Visit: Payer: Self-pay

## 2018-10-15 ENCOUNTER — Encounter: Payer: Self-pay | Admitting: Primary Care

## 2018-10-15 ENCOUNTER — Ambulatory Visit (INDEPENDENT_AMBULATORY_CARE_PROVIDER_SITE_OTHER): Payer: BC Managed Care – PPO | Admitting: Primary Care

## 2018-10-15 VITALS — Wt 377.0 lb

## 2018-10-15 DIAGNOSIS — F411 Generalized anxiety disorder: Secondary | ICD-10-CM | POA: Diagnosis not present

## 2018-10-15 DIAGNOSIS — F331 Major depressive disorder, recurrent, moderate: Secondary | ICD-10-CM

## 2018-10-15 DIAGNOSIS — I10 Essential (primary) hypertension: Secondary | ICD-10-CM

## 2018-10-15 DIAGNOSIS — E119 Type 2 diabetes mellitus without complications: Secondary | ICD-10-CM

## 2018-10-15 DIAGNOSIS — M2550 Pain in unspecified joint: Secondary | ICD-10-CM

## 2018-10-15 DIAGNOSIS — M5442 Lumbago with sciatica, left side: Secondary | ICD-10-CM

## 2018-10-15 DIAGNOSIS — E559 Vitamin D deficiency, unspecified: Secondary | ICD-10-CM | POA: Diagnosis not present

## 2018-10-15 DIAGNOSIS — G8929 Other chronic pain: Secondary | ICD-10-CM

## 2018-10-15 MED ORDER — BASAGLAR KWIKPEN 100 UNIT/ML ~~LOC~~ SOPN: 20.0000 [IU] | PEN_INJECTOR | Freq: Every day | SUBCUTANEOUS | 2 refills | Status: DC

## 2018-10-15 MED ORDER — SERTRALINE HCL 50 MG PO TABS: 50.0000 mg | ORAL_TABLET | Freq: Every day | ORAL | 1 refills | Status: DC

## 2018-10-15 MED ORDER — VITAMIN D (ERGOCALCIFEROL) 1.25 MG (50000 UNIT) PO CAPS: 50000.0000 [IU] | ORAL_CAPSULE | ORAL | 1 refills | Status: DC

## 2018-10-15 NOTE — Assessment & Plan Note (Signed)
Now on gabapentin 300 3-4 times daily and Celebrex BID. Continue current regimen. Continue with physical therapy. 

## 2018-10-15 NOTE — Assessment & Plan Note (Signed)
Compliant to weekly vitamin D, continue same. Refill sent to pharmacy.

## 2018-10-15 NOTE — Progress Notes (Signed)
Subjective:    Patient ID: Brandy [name], female    DOB: 03/30/1966, 53 y.o.   MRN: [id number]  HPI  Virtual Visit via Video Note  I connected with Brandy [name] on 10/15/18 at 12:00 PM EDT by a video enabled telemedicine application and verified that I am speaking with the correct person using two identifiers.  Location: Patient: Home Provider: Office   I discussed the limitations of evaluation and management by telemedicine and the availability of in person appointments. The patient expressed understanding and agreed to proceed.  History of Present Illness:  Brandy [name] is a 53 year old female who presents today for follow up of chronic conditions.  1) Type 2 Diabetes:  Current medications include: Trulicity 1.5 mg weekly, Glipizide 10 mg BID, Metformin ER 1000 mg daily, Basaglar 10 units HS.  She stopped the Trulicity shortly after our last visit due to soreness to the injection site. She's not had this in about one month.   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 Also with chronic fatigue, bilateral wrist and hand pain. Managed on duloxetine 60 mg, gabapentin 300 mg TID, and celebrex 100 mg BID. Currently following with neurosurgery and was doing water aerobics until Covid-19 pandemic.   She is taking gabapentin 3-4 times daily on average, also taking Celebrex twice daily. She will be returning to vascular surgery in August for follow up and potentially starting lymphedema pumps. Neurosurgery thinks that she does have nerve damage, wants to do EMG testing in the future but doesn't think she will  tolerate well. The plan for now is to treat with gabapentin and Celebrex, continue with physical therapy.   3) Anxiety and Depression: Chronic. Currently out of work due to symptoms. Managed on duloxetine 60 mg and buspirone 10 mg BID. Overall she doesn't feel a significant improvement with anxiety and depression symptoms on current regimen, but does notice that her anxiety has slightly reduced over time. She is following weekly with her therapist which is helping.  She continues to experience daily stress and anxiety. She has stress around going back to work, stress with her family and personal life. She feels stressed when thinking about things she has to get done including her sleep study. She also struggles with concentration and focus. She will lose her thoughts often in mid sentence. She has recently touched base with her long term disability department and doesn't feel as though she's ready to return to work due to anxiety and stress, difficulty focusing/concentrating, and wanting to sleep all day.    Observations/Objective:  Alert and oriented. Appears well, not sickly. No distress. Speaking in complete sentences.   Assessment and Plan:  See problem based charting.  Follow Up Instructions:  Stop duloxetine (Cymbalta) 60 mg for anxiety and depression.  Start sertraline (Zoloft) 50 mg tablets for anxiety and depression.  You may continue or discontinue the buspirone tablets for anxiety.   Continue to meet with your therapist.  Increase your Basaglar insulin to 20 units nightly. Continue Glipizide and Metformin.  Schedule a follow up visit for one month.  It  was a pleasure to see you today!    I discussed the assessment and treatment plan with the patient. The patient was provided an opportunity to ask questions and all were answered. The patient agreed with the plan and demonstrated an understanding of the instructions.   The patient was advised to call back or seek an  in-person evaluation if the symptoms worsen or if the condition fails to improve as anticipated.     Pleas Koch, NP    Review of Systems  Eyes: Negative for visual disturbance.  Respiratory: Negative for shortness of breath.   Cardiovascular: Negative for chest pain.  Musculoskeletal: Positive for arthralgias and back pain.  Neurological: Negative for headaches.  Psychiatric/Behavioral: The patient is nervous/anxious.        Past Medical History:  Diagnosis Date  . Depression   . Dysmetabolic syndrome X   . Hypertension   . Hypertriglyceridemia   . Obesity   . Tetanus vaccine causing adverse effect in therapeutic use      Social History   Socioeconomic History  . Marital status: Married    Spouse name: Not on file  . Number of children: 3  . Years of education: Not on file  . Highest education level: Not on file  Occupational History  . Occupation: Primary school teacher: [employer]  . Financial resource strain: Not on file  . Food insecurity    Worry: Not on file    Inability: Not on file  . Transportation needs    Medical: Not on file    Non-medical: Not on file  Tobacco Use  . Smoking status: Never Smoker  . Smokeless tobacco: Never Used  Substance and Sexual Activity  . Alcohol use: Yes    Comment: occasional  . Drug use: No  . Sexual activity: Not on file  Lifestyle  . Physical activity    Days per week: Not on file    Minutes per session: Not on file  . Stress: Not on file  Relationships  . Social Herbalist on phone: Not on file    Gets together: Not on file    Attends religious service: Not on file    Active member of club or organization: Not on file    Attends meetings of clubs or organizations: Not on file    Relationship status: Not on file  . Intimate partner violence    Fear of current or ex partner: Not on file    Emotionally abused: Not on file    Physically abused: Not on file    Forced  sexual activity: Not on file  Other Topics Concern  . Not on file  Social History Narrative   Designer, jewellery      Married; 3 kids (middle child-severely autistic)      No regular exercise    Past Surgical History:  Procedure Laterality Date  . 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 Rheum arthritis Mother   . COPD Mother   . Hypertension Father   . Heart disease Maternal Grandmother   . Stroke Maternal Grandmother   . Alcohol abuse Maternal Grandmother   . Alcohol abuse Maternal Grandfather   . Aneurysm Paternal Grandmother   . Autism Son        severe    Allergies  Allergen Reactions  . Diphenhydramine Hcl  REACTION: minor rash  . Penicillins     REACTION: reaction as a child    Current Outpatient Medications on File Prior to Visit  Medication Sig Dispense Refill  . atorvastatin (LIPITOR) 10 MG tablet Take 1 tablet (10 mg total) by mouth daily. For cholesterol. 90 tablet 3  . busPIRone (BUSPAR) 10 MG tablet TAKE 1 TABLET (10 MG TOTAL) BY MOUTH 2 (TWO) TIMES DAILY. FOR ANXIETY. 180 tablet 0  . celecoxib (CELEBREX) 100 MG capsule Take 100 mg by mouth 2 (two) times daily.    . cetirizine (ZYRTEC) 10 MG tablet Take 1 tablet (10 mg total) by mouth daily. 30 tablet 11  . gabapentin (NEURONTIN) 300 MG capsule Take 300 mg by mouth 3 (three) times daily.    Marland Kitchen. glipiZIDE (GLUCOTROL) 10 MG tablet Take 1 tablet (10 mg total) by mouth 2 (two) times daily before a meal. For diabetes. 180 tablet 3  . Insulin Pen Needle (PEN NEEDLES) 31G X 6 MM MISC Use nightly with insulin. 100 each 2  . lisinopril-hydrochlorothiazide (ZESTORETIC) 20-12.5 MG tablet Take 1 tablet by mouth daily. For blood pressure. 90 tablet 3  . metFORMIN (GLUCOPHAGE-XR) 500 MG 24 hr tablet TAKE 2 TABLETS (1,000 MG TOTAL) BY MOUTH DAILY WITH BREAKFAST. FOR DIABETES. 180 tablet 1   No current facility-administered medications on file prior to visit.     Wt  (!) 377 lb (171 kg)   LMP 09/29/2018   BMI 59.05 kg/m    Objective:   Physical Exam  Constitutional: She is oriented to person, place, and time. She appears well-nourished.  Respiratory: Effort normal.  Neurological: She is alert and oriented to person, place, and time.  Psychiatric: She has a normal mood and affect.           Assessment & Plan:

## 2018-10-15 NOTE — Assessment & Plan Note (Signed)
Will stop duloxetine 60 mg, start sertraline 50 mg. Continue or discontinue buspirone, she will monitor to see if this is actually helping. Continue with weekly therapy. Follow up in 1 month.  If no improvement then consider psychiatry evaluation for ongoing symptoms and focus/attention.

## 2018-10-15 NOTE — Assessment & Plan Note (Signed)
Could not tolerate Trulicity, has not used in one month. Glucose readings are too high on current regimen, increase Basaglar to 20 units. Continue Metformin and Glipizide.  Follow up in 1 month.

## 2018-10-15 NOTE — Patient Instructions (Signed)
Stop duloxetine (Cymbalta) 60 mg for anxiety and depression.  Start sertraline (Zoloft) 50 mg tablets for anxiety and depression.  You may continue or discontinue the buspirone tablets for anxiety.   Continue to meet with your therapist.  Increase your Basaglar insulin to 20 units nightly. Continue Glipizide and Metformin.  Schedule a follow up visit for one month.  It was a pleasure to see you today!

## 2018-10-15 NOTE — Assessment & Plan Note (Signed)
Endorses normal home readings. Continue to monitor.

## 2018-10-15 NOTE — Assessment & Plan Note (Addendum)
Uncontrolled on current regimen of duloxetine 60 mg and buspirone 10 mg BID. Long discussion today regarding options for treatment, she does seem to have focus issues.  Will stop duloxetine 60 mg, start sertraline 50 mg. Continue or discontinue buspirone, she will monitor to see if this is actually helping. Continue with weekly therapy. Follow up in 1 month.  If no improvement then consider psychiatry evaluation for ongoing symptoms and focus/attention.  We will continue to keep her out of work until we can get a better handle on symptoms.

## 2018-10-15 NOTE — Assessment & Plan Note (Signed)
Now on gabapentin 300 3-4 times daily and Celebrex BID. Continue current regimen. Continue with physical therapy.

## 2018-10-15 NOTE — Telephone Encounter (Signed)
Paperwork faxed 6/12 Copy for pt Copy for scan

## 2018-10-16 ENCOUNTER — Ambulatory Visit (INDEPENDENT_AMBULATORY_CARE_PROVIDER_SITE_OTHER): Payer: BC Managed Care – PPO | Admitting: Psychology

## 2018-10-16 DIAGNOSIS — M25512 Pain in left shoulder: Secondary | ICD-10-CM | POA: Diagnosis not present

## 2018-10-16 DIAGNOSIS — M79605 Pain in left leg: Secondary | ICD-10-CM | POA: Diagnosis not present

## 2018-10-16 DIAGNOSIS — M79604 Pain in right leg: Secondary | ICD-10-CM | POA: Diagnosis not present

## 2018-10-16 DIAGNOSIS — M545 Low back pain: Secondary | ICD-10-CM | POA: Diagnosis not present

## 2018-10-16 DIAGNOSIS — F3289 Other specified depressive episodes: Secondary | ICD-10-CM

## 2018-10-21 DIAGNOSIS — M79604 Pain in right leg: Secondary | ICD-10-CM | POA: Diagnosis not present

## 2018-10-21 DIAGNOSIS — M25512 Pain in left shoulder: Secondary | ICD-10-CM | POA: Diagnosis not present

## 2018-10-21 DIAGNOSIS — M545 Low back pain: Secondary | ICD-10-CM | POA: Diagnosis not present

## 2018-10-21 DIAGNOSIS — M79605 Pain in left leg: Secondary | ICD-10-CM | POA: Diagnosis not present

## 2018-10-23 ENCOUNTER — Ambulatory Visit (INDEPENDENT_AMBULATORY_CARE_PROVIDER_SITE_OTHER): Payer: BC Managed Care – PPO | Admitting: Psychology

## 2018-10-23 DIAGNOSIS — F3289 Other specified depressive episodes: Secondary | ICD-10-CM | POA: Diagnosis not present

## 2018-10-23 DIAGNOSIS — M79604 Pain in right leg: Secondary | ICD-10-CM | POA: Diagnosis not present

## 2018-10-23 DIAGNOSIS — M25512 Pain in left shoulder: Secondary | ICD-10-CM | POA: Diagnosis not present

## 2018-10-23 DIAGNOSIS — M79605 Pain in left leg: Secondary | ICD-10-CM | POA: Diagnosis not present

## 2018-10-23 DIAGNOSIS — M545 Low back pain: Secondary | ICD-10-CM | POA: Diagnosis not present

## 2018-10-27 ENCOUNTER — Other Ambulatory Visit (INDEPENDENT_AMBULATORY_CARE_PROVIDER_SITE_OTHER): Payer: Self-pay | Admitting: Nurse Practitioner

## 2018-10-29 ENCOUNTER — Other Ambulatory Visit: Payer: Self-pay | Admitting: Primary Care

## 2018-10-29 DIAGNOSIS — F411 Generalized anxiety disorder: Secondary | ICD-10-CM

## 2018-10-30 ENCOUNTER — Ambulatory Visit (INDEPENDENT_AMBULATORY_CARE_PROVIDER_SITE_OTHER): Payer: BC Managed Care – PPO | Admitting: Psychology

## 2018-10-30 DIAGNOSIS — M79605 Pain in left leg: Secondary | ICD-10-CM | POA: Diagnosis not present

## 2018-10-30 DIAGNOSIS — M25512 Pain in left shoulder: Secondary | ICD-10-CM | POA: Diagnosis not present

## 2018-10-30 DIAGNOSIS — M545 Low back pain: Secondary | ICD-10-CM | POA: Diagnosis not present

## 2018-10-30 DIAGNOSIS — F3289 Other specified depressive episodes: Secondary | ICD-10-CM

## 2018-10-30 DIAGNOSIS — M79604 Pain in right leg: Secondary | ICD-10-CM | POA: Diagnosis not present

## 2018-11-06 ENCOUNTER — Ambulatory Visit (INDEPENDENT_AMBULATORY_CARE_PROVIDER_SITE_OTHER): Payer: BC Managed Care – PPO | Admitting: Psychology

## 2018-11-06 DIAGNOSIS — M545 Low back pain: Secondary | ICD-10-CM | POA: Diagnosis not present

## 2018-11-06 DIAGNOSIS — F3289 Other specified depressive episodes: Secondary | ICD-10-CM

## 2018-11-06 DIAGNOSIS — M25512 Pain in left shoulder: Secondary | ICD-10-CM | POA: Diagnosis not present

## 2018-11-06 DIAGNOSIS — M79604 Pain in right leg: Secondary | ICD-10-CM | POA: Diagnosis not present

## 2018-11-06 DIAGNOSIS — M79605 Pain in left leg: Secondary | ICD-10-CM | POA: Diagnosis not present

## 2018-11-11 DIAGNOSIS — M79605 Pain in left leg: Secondary | ICD-10-CM | POA: Diagnosis not present

## 2018-11-11 DIAGNOSIS — M79604 Pain in right leg: Secondary | ICD-10-CM | POA: Diagnosis not present

## 2018-11-11 DIAGNOSIS — M545 Low back pain: Secondary | ICD-10-CM | POA: Diagnosis not present

## 2018-11-11 DIAGNOSIS — M25512 Pain in left shoulder: Secondary | ICD-10-CM | POA: Diagnosis not present

## 2018-11-12 ENCOUNTER — Other Ambulatory Visit: Payer: Self-pay | Admitting: Primary Care

## 2018-11-12 DIAGNOSIS — F411 Generalized anxiety disorder: Secondary | ICD-10-CM

## 2018-11-12 DIAGNOSIS — F331 Major depressive disorder, recurrent, moderate: Secondary | ICD-10-CM

## 2018-11-13 ENCOUNTER — Ambulatory Visit (INDEPENDENT_AMBULATORY_CARE_PROVIDER_SITE_OTHER): Payer: BC Managed Care – PPO | Admitting: Psychology

## 2018-11-13 DIAGNOSIS — M545 Low back pain: Secondary | ICD-10-CM | POA: Diagnosis not present

## 2018-11-13 DIAGNOSIS — M79605 Pain in left leg: Secondary | ICD-10-CM | POA: Diagnosis not present

## 2018-11-13 DIAGNOSIS — F3289 Other specified depressive episodes: Secondary | ICD-10-CM | POA: Diagnosis not present

## 2018-11-13 DIAGNOSIS — M79604 Pain in right leg: Secondary | ICD-10-CM | POA: Diagnosis not present

## 2018-11-13 DIAGNOSIS — M25512 Pain in left shoulder: Secondary | ICD-10-CM | POA: Diagnosis not present

## 2018-11-18 DIAGNOSIS — M79604 Pain in right leg: Secondary | ICD-10-CM | POA: Diagnosis not present

## 2018-11-18 DIAGNOSIS — M545 Low back pain: Secondary | ICD-10-CM | POA: Diagnosis not present

## 2018-11-18 DIAGNOSIS — M79605 Pain in left leg: Secondary | ICD-10-CM | POA: Diagnosis not present

## 2018-11-18 DIAGNOSIS — M25512 Pain in left shoulder: Secondary | ICD-10-CM | POA: Diagnosis not present

## 2018-11-20 ENCOUNTER — Ambulatory Visit (INDEPENDENT_AMBULATORY_CARE_PROVIDER_SITE_OTHER): Payer: BC Managed Care – PPO | Admitting: Psychology

## 2018-11-20 DIAGNOSIS — M545 Low back pain: Secondary | ICD-10-CM | POA: Diagnosis not present

## 2018-11-20 DIAGNOSIS — M79604 Pain in right leg: Secondary | ICD-10-CM | POA: Diagnosis not present

## 2018-11-20 DIAGNOSIS — M79605 Pain in left leg: Secondary | ICD-10-CM | POA: Diagnosis not present

## 2018-11-20 DIAGNOSIS — F3289 Other specified depressive episodes: Secondary | ICD-10-CM

## 2018-11-20 DIAGNOSIS — M25512 Pain in left shoulder: Secondary | ICD-10-CM | POA: Diagnosis not present

## 2018-11-24 ENCOUNTER — Telehealth: Payer: Self-pay | Admitting: Primary Care

## 2018-11-24 NOTE — Telephone Encounter (Signed)
[employer] faxed accommodation request form (ADA) In [employer]ate[employer] in box Pt has appointment today 7/28

## 2018-11-25 ENCOUNTER — Ambulatory Visit: Payer: BC Managed Care – PPO | Admitting: Primary Care

## 2018-11-25 DIAGNOSIS — D229 Melanocytic nevi, unspecified: Secondary | ICD-10-CM | POA: Diagnosis not present

## 2018-11-25 DIAGNOSIS — M25512 Pain in left shoulder: Secondary | ICD-10-CM | POA: Diagnosis not present

## 2018-11-25 DIAGNOSIS — L718 Other rosacea: Secondary | ICD-10-CM | POA: Diagnosis not present

## 2018-11-25 DIAGNOSIS — M79604 Pain in right leg: Secondary | ICD-10-CM | POA: Diagnosis not present

## 2018-11-25 DIAGNOSIS — M545 Low back pain: Secondary | ICD-10-CM | POA: Diagnosis not present

## 2018-11-25 DIAGNOSIS — M79605 Pain in left leg: Secondary | ICD-10-CM | POA: Diagnosis not present

## 2018-11-25 DIAGNOSIS — L821 Other seborrheic keratosis: Secondary | ICD-10-CM | POA: Diagnosis not present

## 2018-11-27 ENCOUNTER — Ambulatory Visit: Payer: BC Managed Care – PPO | Admitting: Psychology

## 2018-11-27 NOTE — Telephone Encounter (Signed)
Brandy [name], it does not appear that the patient has an appointment scheduled until August 12.  I do not see her on 28th of July. Please explained to her the forms that we have received which are recommendations for returning to work with modifications.  Is she willing to return to work with the reduction in work hours?  Additional breaks?  Remote work? I am happy to discuss this on 12 August as well.

## 2018-11-27 NOTE — Telephone Encounter (Signed)
Spoke with pt she stated she will talk to you about this on 8/12 appointment Forms in kate[employer] in box

## 2018-11-28 NOTE — Telephone Encounter (Signed)
Noted  

## 2018-12-04 ENCOUNTER — Ambulatory Visit: Payer: BC Managed Care – PPO | Admitting: Psychology

## 2018-12-04 ENCOUNTER — Telehealth: Payer: Self-pay

## 2018-12-04 NOTE — Telephone Encounter (Signed)
Carrie Mew nurse consultant with [employer] left v/m wanting to know if paperwork for ADA was received recently and if so was ADA paperwork sent back to employer. Levada Dy request copy of ADA paperwork faxed to [employer] at [phone] with claim # [id number] on subject line so [employer] can help pt return to work.Please advise.

## 2018-12-05 NOTE — Telephone Encounter (Signed)
Patient with upcoming appointment with Anda Kraft to discuss next week.

## 2018-12-09 DIAGNOSIS — M25512 Pain in left shoulder: Secondary | ICD-10-CM | POA: Diagnosis not present

## 2018-12-09 DIAGNOSIS — M79605 Pain in left leg: Secondary | ICD-10-CM | POA: Diagnosis not present

## 2018-12-09 DIAGNOSIS — M545 Low back pain: Secondary | ICD-10-CM | POA: Diagnosis not present

## 2018-12-09 DIAGNOSIS — M79604 Pain in right leg: Secondary | ICD-10-CM | POA: Diagnosis not present

## 2018-12-10 ENCOUNTER — Ambulatory Visit (INDEPENDENT_AMBULATORY_CARE_PROVIDER_SITE_OTHER): Payer: BC Managed Care – PPO | Admitting: Primary Care

## 2018-12-10 ENCOUNTER — Other Ambulatory Visit: Payer: Self-pay

## 2018-12-10 VITALS — BP 134/80 | HR 82 | Temp 98.3°F | Ht 67.0 in | Wt 367.5 lb

## 2018-12-10 DIAGNOSIS — F411 Generalized anxiety disorder: Secondary | ICD-10-CM

## 2018-12-10 DIAGNOSIS — E119 Type 2 diabetes mellitus without complications: Secondary | ICD-10-CM | POA: Diagnosis not present

## 2018-12-10 DIAGNOSIS — E559 Vitamin D deficiency, unspecified: Secondary | ICD-10-CM

## 2018-12-10 DIAGNOSIS — F331 Major depressive disorder, recurrent, moderate: Secondary | ICD-10-CM | POA: Diagnosis not present

## 2018-12-10 DIAGNOSIS — I10 Essential (primary) hypertension: Secondary | ICD-10-CM | POA: Diagnosis not present

## 2018-12-10 DIAGNOSIS — Z23 Encounter for immunization: Secondary | ICD-10-CM | POA: Diagnosis not present

## 2018-12-10 DIAGNOSIS — R5382 Chronic fatigue, unspecified: Secondary | ICD-10-CM

## 2018-12-10 DIAGNOSIS — M2550 Pain in unspecified joint: Secondary | ICD-10-CM

## 2018-12-10 LAB — BASIC METABOLIC PANEL
BUN: 14 mg/dL (ref 6–23)
CO2: 25 mEq/L (ref 19–32)
Calcium: 9.1 mg/dL (ref 8.4–10.5)
Chloride: 102 mEq/L (ref 96–112)
Creatinine, Ser: 0.71 mg/dL (ref 0.40–1.20)
GFR: 85.33 mL/min (ref >60.00)
Glucose, Bld: 216 mg/dL — ABNORMAL HIGH (ref 70–99)
Potassium: 4.6 mEq/L (ref 3.5–5.1)
Sodium: 136 mEq/L (ref 135–145)

## 2018-12-10 LAB — POCT GLYCOSYLATED HEMOGLOBIN (HGB A1C): Hemoglobin A1C: 8.9 % — AB (ref 4.0–5.6)

## 2018-12-10 LAB — VITAMIN D 25 HYDROXY (VIT D DEFICIENCY, FRACTURES): VITD: 23.31 ng/mL — ABNORMAL LOW (ref 30.00–100.00)

## 2018-12-10 MED ORDER — LISINOPRIL-HYDROCHLOROTHIAZIDE 20-12.5 MG PO TABS: 1.0000 | ORAL_TABLET | Freq: Every day | ORAL | 0 refills | Status: DC

## 2018-12-10 MED ORDER — BASAGLAR KWIKPEN 100 UNIT/ML ~~LOC~~ SOPN: 25.0000 [IU] | PEN_INJECTOR | Freq: Every day | SUBCUTANEOUS | 2 refills | Status: DC

## 2018-12-10 NOTE — Progress Notes (Signed)
Subjective:    Patient ID: Brandy [name], female    DOB: 06/12/1965, 53 y.o.   MRN: [id number]  HPI  Brandy [name] is a 53 year old female who presents today for follow up of chronic conditions.  1) Type 2 Diabetes:   Current medications include: Glipizide 10 mg BID, Metformin ER 1000 mg daily, Basaglar 20 units HS.  She is checking her blood glucose 1-3 times daily and is getting readings of:  AM fasting: 190's to low 200's Afternoon/Evening readings: 190's-290's  Last A1C: 8.5 in early May 2020 Last Eye Exam: Due in October 2020 Last Foot Exam: Due today Pneumonia Vaccination: Due today ACE/ARB: ACE Statin: atorvastatin   2) GAD/MDD/Chronic Fatigue: Currently managed on buspirone 10 mg BID, sertraline 50 mg daily. She was previously managed on duloxetine 60 mg for which she's been off of for over one month. Current symptoms include mood swings, feels very "emotional", increased pain, irritability, she would like to resume duloxetine as she felt better when taking. She continues to follow with her psychologist every week.  She has thought about psychiatry and is willing to undergo evaluation.  She is following with pulmonology, was ruled out from sleep apnea and is undergoing evaluation for narcolepsy.    She continues to remain "scared to death" to return to work. She is not ready to return to work as she feels like we are "just starting to make progress". She continues to experience chronic fatigue with rest and movement. She feels tired most of the time. She continues to have difficulty focusing, loses concentration easily, feels anxious/nervous, loses her thoughts during conversation. She doesn't feel like she can multi-task at work, speak with anyone at work, or even lead her team.   3) Chronic Pain (Back/Hand/Wrist): Increased since coming off of duloxetine 60 mg. She is following with orthopedics and is managed on Celebrex 100 mg BID and gabapentin 300 mg TID.   She is still  active in water and land aerobics through her physical therapist, she does feel like she's making progress. Her orthopedic doctor gave her the option of undergoing lumbar spine injections vs nerve conduction studies, she has declined and will continue with medications.   Review of Systems  Constitutional: Positive for fatigue.  Eyes: Negative for visual disturbance.  Respiratory: Negative for shortness of breath.   Cardiovascular: Negative for chest pain.  Musculoskeletal: Positive for arthralgias and myalgias.  Neurological: Positive for numbness. Negative for dizziness and headaches.  Psychiatric/Behavioral:       See HPI       Past Medical History:  Diagnosis Date  . Depression   . Dysmetabolic syndrome X   . Hypertension   . Hypertriglyceridemia   . Obesity   . Tetanus vaccine causing adverse effect in therapeutic use      Social History   Socioeconomic History  . Marital status: Married    Spouse name: Not on file  . Number of children: 3  . Years of education: Not on file  . Highest education level: Not on file  Occupational History  . Occupation: Government social research officerfinancial analyst    Employer: [employer]  Social Needs  . Financial resource strain: Not on file  . Food insecurity    Worry: Not on file    Inability: Not on file  . Transportation needs    Medical: Not on file    Non-medical: Not on file  Tobacco Use  . Smoking status: Never Smoker  . Smokeless tobacco: Never  Used  Substance and Sexual Activity  . Alcohol use: Yes    Comment: occasional  . Drug use: No  . Sexual activity: Not on file  Lifestyle  . Physical activity    Days per week: Not on file    Minutes per session: Not on file  . Stress: Not on file  Relationships  . Social Musicianconnections    Talks on phone: Not on file    Gets together: Not on file    Attends religious service: Not on file    Active member of club or organization: Not on file    Attends meetings of clubs or organizations: Not on file     Relationship status: Not on file  . Intimate partner violence    Fear of current or ex partner: Not on file    Emotionally abused: Not on file    Physically abused: Not on file    Forced sexual activity: Not on file  Other Topics Concern  . Not on file  Social History Narrative   Best boyinancial analyst      Married; 3 kids (middle child-severely autistic)      No regular exercise    Past Surgical History:  Procedure Laterality Date  . 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 Rheum arthritis Mother   . COPD Mother   . Hypertension Father   . Heart disease Maternal Grandmother   . Stroke Maternal Grandmother   . Alcohol abuse Maternal Grandmother   . Alcohol abuse Maternal Grandfather   . Aneurysm Paternal Grandmother   . Autism Son        severe    Allergies  Allergen Reactions  . Diphenhydramine Hcl     REACTION: minor rash  . Penicillins     REACTION: reaction as a child    Current Outpatient Medications on File Prior to Visit  Medication Sig Dispense Refill  . atorvastatin (LIPITOR) 10 MG tablet Take 1 tablet (10 mg total) by mouth daily. For cholesterol. 90 tablet 3  . busPIRone (BUSPAR) 10 MG tablet TAKE 1 TABLET (10 MG TOTAL) BY MOUTH 2 (TWO) TIMES DAILY. FOR ANXIETY. 180 tablet 0  . celecoxib (CELEBREX) 100 MG capsule Take 100 mg by mouth 2 (two) times daily.    . cetirizine (ZYRTEC) 10 MG tablet Take 1 tablet (10 mg total) by mouth daily. 30 tablet 11  . DULoxetine (CYMBALTA) 60 MG capsule Take 60 mg by mouth daily.    Marland Kitchen. gabapentin (NEURONTIN) 300 MG capsule Take 300 mg by mouth 3 (three) times daily.    Marland Kitchen. glipiZIDE (GLUCOTROL) 10 MG tablet Take 1 tablet (10 mg total) by mouth 2 (two) times daily before a meal. For diabetes. 180 tablet 3  . hydrocortisone 2.5 % cream Apply topically 2 (two) times daily.    . Insulin Pen Needle (PEN NEEDLES) 31G X 6 MM MISC Use nightly with insulin. 100 each 2  . metFORMIN  (GLUCOPHAGE-XR) 500 MG 24 hr tablet TAKE 2 TABLETS (1,000 MG TOTAL) BY MOUTH DAILY WITH BREAKFAST. FOR DIABETES. 180 tablet 1  . metroNIDAZOLE (METROCREAM) 0.75 % cream Apply 1 application topically daily.    . Vitamin D, Ergocalciferol, (DRISDOL) 1.25 MG (50000 UT) CAPS capsule Take 1 capsule (50,000 Units total) by mouth every 7 (seven) days. 12 capsule 1   No current facility-administered medications on file prior to visit.     BP 134/80  Pulse 82   Temp 98.3 F (36.8 C) (Temporal)   Ht 5\' 7"  (1.702 m)   Wt (!) 367 lb 8 oz (166.7 kg)   LMP  (LMP Unknown) Comment: 04/2018  SpO2 97%   BMI 57.56 kg/m    Objective:   Physical Exam  Constitutional: She appears well-nourished.  Neck: Neck supple.  Cardiovascular: Normal rate and regular rhythm.  Respiratory: Effort normal and breath sounds normal.  Skin: Skin is warm and dry.  Psychiatric: She has a normal mood and affect.           Assessment & Plan:

## 2018-12-10 NOTE — Assessment & Plan Note (Signed)
Continued.  Following with pulmonology who has ruled out sleep apnea.  She is undergoing testing for narcolepsy.  Could also be from uncontrolled anxiety and depression.  Will be referring to psychiatry for further evaluation.  Lab work-up previously grossly unremarkable.

## 2018-12-10 NOTE — Telephone Encounter (Signed)
Completed and placed on Robins desk. 

## 2018-12-10 NOTE — Assessment & Plan Note (Signed)
Continued, doing overall okay on gabapentin and Celebrex.  Continue physical therapy.

## 2018-12-10 NOTE — Assessment & Plan Note (Signed)
Following with dermatology, compliant to metronidazole and hydrocortisone creams.

## 2018-12-10 NOTE — Assessment & Plan Note (Addendum)
Continued but improved since on gabapentin and Celebrex.  Will resume duloxetine as she felt better when taking this.  Continue physical therapy.  Given continued symptoms with inability to stand for longer than 30 seconds to 60 seconds she is requesting a six-month extension from work.  Disability paperwork completed.

## 2018-12-10 NOTE — Assessment & Plan Note (Signed)
A1c today of 8.9 which is an increase from 8.2 in early May 2020.  This is likely due to coming off of Trulicity.  Increase Basaglar to 25 units, if blood sugars do not decrease to 150 or below and she will increase to 30 units.  Continue metformin and glipizide.  Pneumonia vaccination provided today. Foot exam due next visit. Managed on statin and ACE.  Follow-up in 3 months.

## 2018-12-10 NOTE — Assessment & Plan Note (Signed)
Worse since switching from duloxetine to sertraline.  We will have her wean off sertraline and resume duloxetine. Referral placed to psychiatry for further evaluation and treatment.  Given continued symptoms she would like another 6-month extension to be out of work.  Disability paperwork completed. 

## 2018-12-10 NOTE — Assessment & Plan Note (Signed)
Worse since switching from duloxetine to sertraline.  We will have her wean off sertraline and resume duloxetine. Referral placed to psychiatry for further evaluation and treatment.  Given continued symptoms she would like another 18-month extension to be out of work.  Disability paperwork completed.

## 2018-12-10 NOTE — Patient Instructions (Signed)
Stop by the lab prior to leaving today. I will notify you of your results once received.   You will be contacted regarding your referral to psychiatry.  Please let us know if you have not been contacted within one week.   We've increased your basaglar to 25 units once daily. Increase up to 30 units in one month if your blood sugars do not get to 150 or below.  Cut your Zoloft in half and take this until Sunday then stop. Resume duloxetine (Cymbalta) 60 mg once daily.  Please schedule a follow up appointment in 3 months, have them schedule this at the end of my morning session.  It was a pleasure to see you today!

## 2018-12-10 NOTE — Assessment & Plan Note (Signed)
Compliant to weekly vitamin D, repeat vitamin D level pending. 

## 2018-12-11 ENCOUNTER — Ambulatory Visit (INDEPENDENT_AMBULATORY_CARE_PROVIDER_SITE_OTHER): Payer: BC Managed Care – PPO | Admitting: Psychology

## 2018-12-11 ENCOUNTER — Ambulatory Visit (INDEPENDENT_AMBULATORY_CARE_PROVIDER_SITE_OTHER): Payer: BC Managed Care – PPO | Admitting: Vascular Surgery

## 2018-12-11 DIAGNOSIS — F3289 Other specified depressive episodes: Secondary | ICD-10-CM | POA: Diagnosis not present

## 2018-12-11 NOTE — Telephone Encounter (Signed)
Paperwork faxed Pt aware  

## 2018-12-16 DIAGNOSIS — M25512 Pain in left shoulder: Secondary | ICD-10-CM | POA: Diagnosis not present

## 2018-12-16 DIAGNOSIS — M545 Low back pain: Secondary | ICD-10-CM | POA: Diagnosis not present

## 2018-12-16 DIAGNOSIS — M79604 Pain in right leg: Secondary | ICD-10-CM | POA: Diagnosis not present

## 2018-12-16 DIAGNOSIS — M79605 Pain in left leg: Secondary | ICD-10-CM | POA: Diagnosis not present

## 2018-12-17 NOTE — Telephone Encounter (Signed)
Copy for pt °Copy for scan °

## 2018-12-18 ENCOUNTER — Ambulatory Visit (INDEPENDENT_AMBULATORY_CARE_PROVIDER_SITE_OTHER): Payer: BC Managed Care – PPO | Admitting: Psychology

## 2018-12-18 DIAGNOSIS — M79605 Pain in left leg: Secondary | ICD-10-CM | POA: Diagnosis not present

## 2018-12-18 DIAGNOSIS — M79604 Pain in right leg: Secondary | ICD-10-CM | POA: Diagnosis not present

## 2018-12-18 DIAGNOSIS — F3289 Other specified depressive episodes: Secondary | ICD-10-CM

## 2018-12-18 DIAGNOSIS — M545 Low back pain: Secondary | ICD-10-CM | POA: Diagnosis not present

## 2018-12-18 DIAGNOSIS — M25512 Pain in left shoulder: Secondary | ICD-10-CM | POA: Diagnosis not present

## 2018-12-21 ENCOUNTER — Other Ambulatory Visit: Payer: Self-pay | Admitting: Primary Care

## 2018-12-21 DIAGNOSIS — F411 Generalized anxiety disorder: Secondary | ICD-10-CM

## 2018-12-21 DIAGNOSIS — F331 Major depressive disorder, recurrent, moderate: Secondary | ICD-10-CM

## 2018-12-23 DIAGNOSIS — M79605 Pain in left leg: Secondary | ICD-10-CM | POA: Diagnosis not present

## 2018-12-23 DIAGNOSIS — M25512 Pain in left shoulder: Secondary | ICD-10-CM | POA: Diagnosis not present

## 2018-12-23 DIAGNOSIS — M79604 Pain in right leg: Secondary | ICD-10-CM | POA: Diagnosis not present

## 2018-12-23 DIAGNOSIS — M545 Low back pain: Secondary | ICD-10-CM | POA: Diagnosis not present

## 2018-12-25 ENCOUNTER — Ambulatory Visit (INDEPENDENT_AMBULATORY_CARE_PROVIDER_SITE_OTHER): Payer: BC Managed Care – PPO | Admitting: Psychology

## 2018-12-25 DIAGNOSIS — M25512 Pain in left shoulder: Secondary | ICD-10-CM | POA: Diagnosis not present

## 2018-12-25 DIAGNOSIS — M79605 Pain in left leg: Secondary | ICD-10-CM | POA: Diagnosis not present

## 2018-12-25 DIAGNOSIS — M79604 Pain in right leg: Secondary | ICD-10-CM | POA: Diagnosis not present

## 2018-12-25 DIAGNOSIS — F3289 Other specified depressive episodes: Secondary | ICD-10-CM

## 2018-12-25 DIAGNOSIS — M545 Low back pain: Secondary | ICD-10-CM | POA: Diagnosis not present

## 2018-12-30 DIAGNOSIS — M79605 Pain in left leg: Secondary | ICD-10-CM | POA: Diagnosis not present

## 2018-12-30 DIAGNOSIS — M79604 Pain in right leg: Secondary | ICD-10-CM | POA: Diagnosis not present

## 2018-12-30 DIAGNOSIS — M545 Low back pain: Secondary | ICD-10-CM | POA: Diagnosis not present

## 2018-12-30 DIAGNOSIS — M25512 Pain in left shoulder: Secondary | ICD-10-CM | POA: Diagnosis not present

## 2019-01-01 ENCOUNTER — Ambulatory Visit: Payer: BC Managed Care – PPO | Admitting: Psychology

## 2019-01-06 DIAGNOSIS — M545 Low back pain: Secondary | ICD-10-CM | POA: Diagnosis not present

## 2019-01-06 DIAGNOSIS — M79605 Pain in left leg: Secondary | ICD-10-CM | POA: Diagnosis not present

## 2019-01-06 DIAGNOSIS — M79604 Pain in right leg: Secondary | ICD-10-CM | POA: Diagnosis not present

## 2019-01-06 DIAGNOSIS — M25512 Pain in left shoulder: Secondary | ICD-10-CM | POA: Diagnosis not present

## 2019-01-08 ENCOUNTER — Ambulatory Visit (INDEPENDENT_AMBULATORY_CARE_PROVIDER_SITE_OTHER): Payer: BC Managed Care – PPO | Admitting: Psychology

## 2019-01-08 DIAGNOSIS — F3289 Other specified depressive episodes: Secondary | ICD-10-CM | POA: Diagnosis not present

## 2019-01-15 ENCOUNTER — Ambulatory Visit (INDEPENDENT_AMBULATORY_CARE_PROVIDER_SITE_OTHER): Payer: BC Managed Care – PPO | Admitting: Psychology

## 2019-01-15 DIAGNOSIS — F3289 Other specified depressive episodes: Secondary | ICD-10-CM | POA: Diagnosis not present

## 2019-01-18 ENCOUNTER — Other Ambulatory Visit: Payer: Self-pay | Admitting: Primary Care

## 2019-01-18 DIAGNOSIS — F331 Major depressive disorder, recurrent, moderate: Secondary | ICD-10-CM

## 2019-01-18 DIAGNOSIS — F411 Generalized anxiety disorder: Secondary | ICD-10-CM

## 2019-01-20 DIAGNOSIS — M25512 Pain in left shoulder: Secondary | ICD-10-CM | POA: Diagnosis not present

## 2019-01-20 DIAGNOSIS — M545 Low back pain: Secondary | ICD-10-CM | POA: Diagnosis not present

## 2019-01-20 DIAGNOSIS — M79605 Pain in left leg: Secondary | ICD-10-CM | POA: Diagnosis not present

## 2019-01-20 DIAGNOSIS — M79604 Pain in right leg: Secondary | ICD-10-CM | POA: Diagnosis not present

## 2019-01-21 ENCOUNTER — Other Ambulatory Visit: Payer: Self-pay | Admitting: Primary Care

## 2019-01-21 DIAGNOSIS — F411 Generalized anxiety disorder: Secondary | ICD-10-CM

## 2019-01-22 ENCOUNTER — Other Ambulatory Visit: Payer: Self-pay | Admitting: Primary Care

## 2019-01-22 DIAGNOSIS — M79604 Pain in right leg: Secondary | ICD-10-CM | POA: Diagnosis not present

## 2019-01-22 DIAGNOSIS — E785 Hyperlipidemia, unspecified: Secondary | ICD-10-CM

## 2019-01-22 DIAGNOSIS — M25512 Pain in left shoulder: Secondary | ICD-10-CM | POA: Diagnosis not present

## 2019-01-22 DIAGNOSIS — M545 Low back pain: Secondary | ICD-10-CM | POA: Diagnosis not present

## 2019-01-22 DIAGNOSIS — M79605 Pain in left leg: Secondary | ICD-10-CM | POA: Diagnosis not present

## 2019-01-23 ENCOUNTER — Ambulatory Visit (INDEPENDENT_AMBULATORY_CARE_PROVIDER_SITE_OTHER): Payer: BC Managed Care – PPO | Admitting: Psychology

## 2019-01-23 DIAGNOSIS — F3289 Other specified depressive episodes: Secondary | ICD-10-CM

## 2019-02-03 DIAGNOSIS — M79605 Pain in left leg: Secondary | ICD-10-CM | POA: Diagnosis not present

## 2019-02-03 DIAGNOSIS — M25512 Pain in left shoulder: Secondary | ICD-10-CM | POA: Diagnosis not present

## 2019-02-03 DIAGNOSIS — M79604 Pain in right leg: Secondary | ICD-10-CM | POA: Diagnosis not present

## 2019-02-03 DIAGNOSIS — M545 Low back pain: Secondary | ICD-10-CM | POA: Diagnosis not present

## 2019-02-05 DIAGNOSIS — M79605 Pain in left leg: Secondary | ICD-10-CM | POA: Diagnosis not present

## 2019-02-05 DIAGNOSIS — M79604 Pain in right leg: Secondary | ICD-10-CM | POA: Diagnosis not present

## 2019-02-05 DIAGNOSIS — M545 Low back pain: Secondary | ICD-10-CM | POA: Diagnosis not present

## 2019-02-05 DIAGNOSIS — M25512 Pain in left shoulder: Secondary | ICD-10-CM | POA: Diagnosis not present

## 2019-02-09 DIAGNOSIS — F5105 Insomnia due to other mental disorder: Secondary | ICD-10-CM | POA: Diagnosis not present

## 2019-02-09 DIAGNOSIS — F331 Major depressive disorder, recurrent, moderate: Secondary | ICD-10-CM | POA: Diagnosis not present

## 2019-02-09 DIAGNOSIS — F411 Generalized anxiety disorder: Secondary | ICD-10-CM | POA: Diagnosis not present

## 2019-02-10 DIAGNOSIS — M79604 Pain in right leg: Secondary | ICD-10-CM | POA: Diagnosis not present

## 2019-02-10 DIAGNOSIS — M79605 Pain in left leg: Secondary | ICD-10-CM | POA: Diagnosis not present

## 2019-02-10 DIAGNOSIS — M545 Low back pain: Secondary | ICD-10-CM | POA: Diagnosis not present

## 2019-02-10 DIAGNOSIS — M25512 Pain in left shoulder: Secondary | ICD-10-CM | POA: Diagnosis not present

## 2019-02-12 ENCOUNTER — Ambulatory Visit (INDEPENDENT_AMBULATORY_CARE_PROVIDER_SITE_OTHER): Payer: BC Managed Care – PPO | Admitting: Psychology

## 2019-02-12 DIAGNOSIS — F3289 Other specified depressive episodes: Secondary | ICD-10-CM | POA: Diagnosis not present

## 2019-02-16 DIAGNOSIS — I872 Venous insufficiency (chronic) (peripheral): Secondary | ICD-10-CM

## 2019-02-16 DIAGNOSIS — I10 Essential (primary) hypertension: Secondary | ICD-10-CM

## 2019-02-16 DIAGNOSIS — E781 Pure hyperglyceridemia: Secondary | ICD-10-CM

## 2019-02-18 DIAGNOSIS — F331 Major depressive disorder, recurrent, moderate: Secondary | ICD-10-CM | POA: Diagnosis not present

## 2019-02-18 DIAGNOSIS — R5383 Other fatigue: Secondary | ICD-10-CM | POA: Diagnosis not present

## 2019-02-19 ENCOUNTER — Ambulatory Visit (INDEPENDENT_AMBULATORY_CARE_PROVIDER_SITE_OTHER): Payer: BC Managed Care – PPO | Admitting: Psychology

## 2019-02-19 ENCOUNTER — Other Ambulatory Visit: Payer: Self-pay | Admitting: Primary Care

## 2019-02-19 DIAGNOSIS — M545 Low back pain: Secondary | ICD-10-CM | POA: Diagnosis not present

## 2019-02-19 DIAGNOSIS — E119 Type 2 diabetes mellitus without complications: Secondary | ICD-10-CM

## 2019-02-19 DIAGNOSIS — F3289 Other specified depressive episodes: Secondary | ICD-10-CM

## 2019-02-19 DIAGNOSIS — M79604 Pain in right leg: Secondary | ICD-10-CM | POA: Diagnosis not present

## 2019-02-19 DIAGNOSIS — M25512 Pain in left shoulder: Secondary | ICD-10-CM | POA: Diagnosis not present

## 2019-02-19 DIAGNOSIS — M79605 Pain in left leg: Secondary | ICD-10-CM | POA: Diagnosis not present

## 2019-02-19 NOTE — Telephone Encounter (Signed)
Brandy [name], you know anything about ordering the DexCom monitor? Can you send?

## 2019-02-20 MED ORDER — DEXCOM G6 TRANSMITTER MISC: 1.0000 | Freq: Every day | 1 refills | Status: DC

## 2019-02-20 MED ORDER — DEXCOM G6 RECEIVER DEVI: 1.0000 | Freq: Every day | 1 refills | Status: DC

## 2019-02-20 MED ORDER — DEXCOM G6 SENSOR MISC: 1.0000 | Freq: Every day | 1 refills | Status: DC

## 2019-02-24 ENCOUNTER — Ambulatory Visit (INDEPENDENT_AMBULATORY_CARE_PROVIDER_SITE_OTHER): Payer: BC Managed Care – PPO | Admitting: Psychology

## 2019-02-24 DIAGNOSIS — F3289 Other specified depressive episodes: Secondary | ICD-10-CM | POA: Diagnosis not present

## 2019-02-25 DIAGNOSIS — F5105 Insomnia due to other mental disorder: Secondary | ICD-10-CM | POA: Diagnosis not present

## 2019-02-25 DIAGNOSIS — F331 Major depressive disorder, recurrent, moderate: Secondary | ICD-10-CM | POA: Diagnosis not present

## 2019-02-25 DIAGNOSIS — F411 Generalized anxiety disorder: Secondary | ICD-10-CM | POA: Diagnosis not present

## 2019-02-25 DIAGNOSIS — M79605 Pain in left leg: Secondary | ICD-10-CM | POA: Diagnosis not present

## 2019-02-25 DIAGNOSIS — M79604 Pain in right leg: Secondary | ICD-10-CM | POA: Diagnosis not present

## 2019-02-25 DIAGNOSIS — M25512 Pain in left shoulder: Secondary | ICD-10-CM | POA: Diagnosis not present

## 2019-02-25 DIAGNOSIS — M545 Low back pain: Secondary | ICD-10-CM | POA: Diagnosis not present

## 2019-03-03 ENCOUNTER — Ambulatory Visit (INDEPENDENT_AMBULATORY_CARE_PROVIDER_SITE_OTHER): Payer: BC Managed Care – PPO | Admitting: Psychology

## 2019-03-03 DIAGNOSIS — M25512 Pain in left shoulder: Secondary | ICD-10-CM | POA: Diagnosis not present

## 2019-03-03 DIAGNOSIS — M79604 Pain in right leg: Secondary | ICD-10-CM | POA: Diagnosis not present

## 2019-03-03 DIAGNOSIS — M79605 Pain in left leg: Secondary | ICD-10-CM | POA: Diagnosis not present

## 2019-03-03 DIAGNOSIS — M545 Low back pain: Secondary | ICD-10-CM | POA: Diagnosis not present

## 2019-03-03 DIAGNOSIS — F3289 Other specified depressive episodes: Secondary | ICD-10-CM | POA: Diagnosis not present

## 2019-03-05 DIAGNOSIS — M79605 Pain in left leg: Secondary | ICD-10-CM | POA: Diagnosis not present

## 2019-03-05 DIAGNOSIS — M79604 Pain in right leg: Secondary | ICD-10-CM | POA: Diagnosis not present

## 2019-03-05 DIAGNOSIS — M25512 Pain in left shoulder: Secondary | ICD-10-CM | POA: Diagnosis not present

## 2019-03-05 DIAGNOSIS — M545 Low back pain: Secondary | ICD-10-CM | POA: Diagnosis not present

## 2019-03-10 ENCOUNTER — Ambulatory Visit: Payer: BC Managed Care – PPO | Admitting: Psychology

## 2019-03-12 ENCOUNTER — Ambulatory Visit: Payer: BC Managed Care – PPO | Admitting: Primary Care

## 2019-03-13 ENCOUNTER — Ambulatory Visit: Payer: BC Managed Care – PPO | Admitting: Primary Care

## 2019-03-14 NOTE — Progress Notes (Signed)
Cardiology Office Note  Date:  03/17/2019   ID:  Brandy [name], DOB 09-25-1965, MRN [id number]  PCP:  Doreene Nest, NP   Chief Complaint  Patient presents with  . other    Ref by Brandy Rieger, NP for HTN. Patient last seen in 2012 by Dr. Mariah [name]. Meds reviewed by the pt. verbally.  Pt. c/o falling asleep with any amount of activity and shortness of breath with exertion.     HPI:  [name] is a 53 year old woman with  morbid obesity,  hypertension,  history of anxiety Sleep study negative for sleep apnea.  Diabetes type 2, hemoglobin A1c greater than 8 Presenting by referral from Brandy [name] for consultation of her  tachycardia and HTN.  Presenting today in a wheelchair Out of work since 2019 Over the past year has gained weight, very sedentary Reports that she is always falling asleep during the daytime Did sleep study, was told she did not have sleep apnea or narcolepsy  Seen by psychiatry Started on trazodone, 1/2 pill Gets good sleep, goes to bed a 1 Am Still tired in the day  Does not walk far,  has some joint pain Limited by weight, conditioning does physical therapy twice a week  Reports having some stress/anxiety at home, son with disability  Recent studies Normal LE ABIs in 2020  Lab work reviewed Hba1C: 8.9 ,  EKG personally reviewed by myself on todays visit Shows normal sinus rhythm with rate 98 bpm poor R wave progression to the anterior precordial leads   PMH:   has a past medical history of Depression, Dysmetabolic syndrome X, Hypertension, Hypertriglyceridemia, Obesity, and Tetanus vaccine causing adverse effect in therapeutic use.  PSH:    Past Surgical History:  Procedure Laterality Date  . BONE TUMOR EXCISION  1975   Right leg    Current Outpatient Medications  Medication Sig Dispense Refill  . atorvastatin (LIPITOR) 10 MG tablet TAKE 1 TABLET (10 MG TOTAL) BY MOUTH DAILY. FOR CHOLESTEROL. 90 tablet 0  . busPIRone (BUSPAR)  10 MG tablet TAKE 1 TABLET (10 MG TOTAL) BY MOUTH 2 (TWO) TIMES DAILY. FOR ANXIETY. 180 tablet 1  . celecoxib (CELEBREX) 100 MG capsule Take 100 mg by mouth 2 (two) times daily.    . cetirizine (ZYRTEC) 10 MG tablet Take 1 tablet (10 mg total) by mouth daily. 30 tablet 11  . Continuous Blood Gluc Receiver (DEXCOM G6 RECEIVER) DEVI Inject 1 Device into the skin daily. 4 each 1  . Continuous Blood Gluc Sensor (DEXCOM G6 SENSOR) MISC Inject 1 Device into the skin daily. 3 each 1  . Continuous Blood Gluc Transmit (DEXCOM G6 TRANSMITTER) MISC Inject 1 Device into the skin daily. 4 each 1  . DULoxetine (CYMBALTA) 30 MG capsule Take 30 mg by mouth daily.     . DULoxetine (CYMBALTA) 60 MG capsule Take 60 mg by mouth daily.    . folic acid (FOLVITE) 1 MG tablet Take 1 mg by mouth daily.    Marland Kitchen gabapentin (NEURONTIN) 300 MG capsule Take 300 mg by mouth 3 (three) times daily.    Marland Kitchen glipiZIDE (GLUCOTROL) 10 MG tablet Take 1 tablet (10 mg total) by mouth 2 (two) times daily before a meal. For diabetes. 180 tablet 3  . hydrocortisone 2.5 % cream Apply topically 2 (two) times daily.    . Insulin Glargine (BASAGLAR KWIKPEN) 100 UNIT/ML SOPN Inject 0.25 mLs (25 Units total) into the skin at bedtime. 15 mL 2  . Insulin  Pen Needle (PEN NEEDLES) 31G X 6 MM MISC Use nightly with insulin. 100 each 2  . lisinopril-hydrochlorothiazide (ZESTORETIC) 20-12.5 MG tablet Take 1 tablet by mouth daily. For blood pressure. 90 tablet 0  . metFORMIN (GLUCOPHAGE-XR) 500 MG 24 hr tablet TAKE 2 TABLETS (1,000 MG TOTAL) BY MOUTH DAILY WITH BREAKFAST. FOR DIABETES. 180 tablet 1  . metroNIDAZOLE (METROCREAM) 0.75 % cream Apply 1 application topically daily.    . traZODone (DESYREL) 100 MG tablet Take 100 mg by mouth at bedtime.     . Vitamin D, Ergocalciferol, (DRISDOL) 1.25 MG (50000 UT) CAPS capsule Take 1 capsule (50,000 Units total) by mouth every 7 (seven) days. 12 capsule 1   No current facility-administered medications for this  visit.      Allergies:   Diphenhydramine hcl and Penicillins   Social History:  The patient  reports that she has never smoked. She has never used smokeless tobacco. She reports current alcohol use. She reports that she does not use drugs.   Family History:   family history includes Alcohol abuse in her maternal grandfather and maternal grandmother; Aneurysm in her paternal grandmother; Autism in her son; COPD in her mother; Heart disease in her maternal grandmother; Hypertension in her father; Rheum arthritis in her mother; Stroke in her maternal grandmother.    Review of Systems: Review of Systems  Constitutional: Positive for malaise/fatigue.  HENT: Negative.   Respiratory: Positive for shortness of breath.   Cardiovascular: Negative.   Gastrointestinal: Negative.   Musculoskeletal: Negative.   Neurological: Negative.   Psychiatric/Behavioral: Negative.   All other systems reviewed and are negative.    PHYSICAL EXAM: VS:  BP 130/84 (BP Location: Right Arm, Patient Position: Sitting, Cuff Size: Large)   Pulse 98   Temp (!) 97.2 F (36.2 C)   Ht 5\' 7"  (1.702 m)   Wt (!) 377 lb (171 kg)   SpO2 98%   BMI 59.05 kg/m  , BMI Body mass index is 59.05 kg/m. GEN: Well nourished, well developed, in no acute distress, morbidly obese HEENT: normal Neck: no JVD, carotid bruits, or masses Cardiac: RRR; no murmurs, rubs, or gallops,no edema  Respiratory:  clear to auscultation bilaterally, normal work of breathing GI: soft, nontender, nondistended, + BS MS: no deformity or atrophy Skin: warm and dry, no rash Neuro:  Strength and sensation are intact Psych: euthymic mood, full affect   Recent Labs: 04/04/2018: ALT 20; Hemoglobin 13.0; Platelets 297.0 05/23/2018: TSH 1.88 12/10/2018: BUN 14; Creatinine, Ser 0.71; Potassium 4.6; Sodium 136    Lipid Panel Lab Results  Component Value Date   CHOL 146 04/04/2018   HDL 56.00 04/04/2018   LDLCALC 63 04/04/2018   TRIG 139.0  04/04/2018    Wt Readings from Last 3 Encounters:  03/17/19 (!) 377 lb (171 kg)  12/10/18 (!) 367 lb 8 oz (166.7 kg)  10/15/18 (!) 377 lb (171 kg)      ASSESSMENT AND PLAN:  Problem List Items Addressed This Visit      Cardiology Problems   Essential hypertension, benign - Primary   Relevant Orders   EKG 12-Lead   Chronic venous insufficiency   HYPERTRIGLYCERIDEMIA     Other   Type 2 diabetes mellitus (HCC)     Fatigue/shortness of breath Likely multifactorial including morbid obesity, deconditioning EKG essentially benign, normal blood pressure Very sedentary Discussed strategies to get her more active, weight loss  Diastolic CHF Improved leg edema on HCTZ Appears relatively euvolemic, stressed importance of weight  loss  Morbid obesity Recommend referral to dietitian Also recommended [facility] program for conditioning and weight loss  Type 2 diabetes Hemoglobin A1c elevated greater than 8 Suggested dietitian and rehab as above  Hypertension Blood pressure is well controlled on today's visit. No changes made to the medications.  Hyperlipidemia We will hold the Lipitor for now given muscle and joint ache If no improvement after 1 or 2 months, could restart the medication  Disposition:   F/U as needed   Total encounter time more than 60 minutes  Greater than 50% was spent in counseling and coordination of care with the patient  Patient was seen in consultation for Alma Friendly and will be referred back to her office for ongoing care of the issues detailed above   Signed, Esmond Plants, M.D., Ph.D. [place], [place]

## 2019-03-17 ENCOUNTER — Encounter: Payer: Self-pay | Admitting: Cardiovascular Disease

## 2019-03-17 ENCOUNTER — Other Ambulatory Visit: Payer: Self-pay

## 2019-03-17 ENCOUNTER — Ambulatory Visit (INDEPENDENT_AMBULATORY_CARE_PROVIDER_SITE_OTHER): Payer: BC Managed Care – PPO | Admitting: Cardiovascular Disease

## 2019-03-17 VITALS — BP 130/84 | HR 98 | Temp 97.2°F | Ht 67.0 in | Wt 377.0 lb

## 2019-03-17 DIAGNOSIS — I872 Venous insufficiency (chronic) (peripheral): Secondary | ICD-10-CM

## 2019-03-17 DIAGNOSIS — E781 Pure hyperglyceridemia: Secondary | ICD-10-CM

## 2019-03-17 DIAGNOSIS — M545 Low back pain: Secondary | ICD-10-CM | POA: Diagnosis not present

## 2019-03-17 DIAGNOSIS — I10 Essential (primary) hypertension: Secondary | ICD-10-CM | POA: Diagnosis not present

## 2019-03-17 DIAGNOSIS — M79605 Pain in left leg: Secondary | ICD-10-CM | POA: Diagnosis not present

## 2019-03-17 DIAGNOSIS — M25512 Pain in left shoulder: Secondary | ICD-10-CM | POA: Diagnosis not present

## 2019-03-17 DIAGNOSIS — E1159 Type 2 diabetes mellitus with other circulatory complications: Secondary | ICD-10-CM

## 2019-03-17 DIAGNOSIS — M79604 Pain in right leg: Secondary | ICD-10-CM | POA: Diagnosis not present

## 2019-03-17 NOTE — Patient Instructions (Addendum)
Referral to [facility]   Direct Dial: [phone]  Fax: [phone]  Referral to [facility].   Medication Instructions:  Do a trial hold of the atorvastatin, at least a month  If you need a refill on your cardiac medications before your next appointment, please call your pharmacy.    Lab work: No new labs needed   If you have labs (blood work) drawn today and your tests are completely normal, you will receive your results only by: Marland Kitchen MyChart Message (if you have MyChart) OR . A paper copy in the mail If you have any lab test that is abnormal or we need to change your treatment, we will call you to review the results.   Testing/Procedures: No new testing needed   Follow-Up: At [facility], you and your health needs are our priority.  As part of our continuing mission to provide you with exceptional heart care, we have created designated Provider Care Teams.  These Care Teams include your primary Cardiologist (physician) and Advanced Practice Providers (APPs -  Physician Assistants and Nurse Practitioners) who all work together to provide you with the care you need, when you need it.  . You will need a follow up appointment as needed  . Providers on your designated Care Team:   . Murray Hodgkins, NP . Christell Faith, PA-C . Marrianne Mood, PA-C  Any Other Special Instructions Will Be Listed Below (If Applicable).  For educational health videos Log in to : [email or web address] Or : [email or web address], password : [email or web address]

## 2019-03-18 ENCOUNTER — Encounter: Payer: Self-pay | Admitting: Primary Care

## 2019-03-18 ENCOUNTER — Ambulatory Visit (INDEPENDENT_AMBULATORY_CARE_PROVIDER_SITE_OTHER): Payer: BC Managed Care – PPO | Admitting: Primary Care

## 2019-03-18 ENCOUNTER — Ambulatory Visit: Payer: BC Managed Care – PPO | Admitting: Primary Care

## 2019-03-18 VITALS — BP 134/80 | HR 105 | Temp 97.7°F | Ht 67.0 in | Wt 380.5 lb

## 2019-03-18 DIAGNOSIS — Z23 Encounter for immunization: Secondary | ICD-10-CM | POA: Diagnosis not present

## 2019-03-18 DIAGNOSIS — E119 Type 2 diabetes mellitus without complications: Secondary | ICD-10-CM

## 2019-03-18 DIAGNOSIS — R1032 Left lower quadrant pain: Secondary | ICD-10-CM | POA: Diagnosis not present

## 2019-03-18 DIAGNOSIS — E781 Pure hyperglyceridemia: Secondary | ICD-10-CM

## 2019-03-18 DIAGNOSIS — R7989 Other specified abnormal findings of blood chemistry: Secondary | ICD-10-CM

## 2019-03-18 DIAGNOSIS — E559 Vitamin D deficiency, unspecified: Secondary | ICD-10-CM | POA: Diagnosis not present

## 2019-03-18 DIAGNOSIS — I10 Essential (primary) hypertension: Secondary | ICD-10-CM

## 2019-03-18 DIAGNOSIS — F411 Generalized anxiety disorder: Secondary | ICD-10-CM

## 2019-03-18 DIAGNOSIS — G8929 Other chronic pain: Secondary | ICD-10-CM

## 2019-03-18 DIAGNOSIS — R102 Pelvic and perineal pain: Secondary | ICD-10-CM | POA: Diagnosis not present

## 2019-03-18 DIAGNOSIS — M5442 Lumbago with sciatica, left side: Secondary | ICD-10-CM

## 2019-03-18 DIAGNOSIS — F331 Major depressive disorder, recurrent, moderate: Secondary | ICD-10-CM

## 2019-03-18 DIAGNOSIS — M2550 Pain in unspecified joint: Secondary | ICD-10-CM

## 2019-03-18 LAB — POCT GLYCOSYLATED HEMOGLOBIN (HGB A1C): Hemoglobin A1C: 9.1 % — AB (ref 4.0–5.6)

## 2019-03-18 LAB — LIPID PANEL
Cholesterol: 160 mg/dL (ref 0–200)
HDL: 56.7 mg/dL (ref >39.00)
LDL Cholesterol: 71 mg/dL (ref 0–99)
NonHDL: 103.06
Total CHOL/HDL Ratio: 3
Triglycerides: 159 mg/dL — ABNORMAL HIGH (ref 0.0–149.0)
VLDL: 31.8 mg/dL (ref 0.0–40.0)

## 2019-03-18 LAB — CBC
HCT: 39.4 % (ref 36.0–46.0)
Hemoglobin: 13.2 g/dL (ref 12.0–15.0)
MCHC: 33.5 g/dL (ref 30.0–36.0)
MCV: 90.4 fl (ref 78.0–100.0)
Platelets: 349 10*3/uL (ref 150.0–400.0)
RBC: 4.36 Mil/uL (ref 3.87–5.11)
RDW: 14.1 % (ref 11.5–15.5)
WBC: 8.1 10*3/uL (ref 4.0–10.5)

## 2019-03-18 LAB — VITAMIN D 25 HYDROXY (VIT D DEFICIENCY, FRACTURES): VITD: 21.46 ng/mL — ABNORMAL LOW (ref 30.00–100.00)

## 2019-03-18 LAB — TSH: TSH: 1.59 u[IU]/mL (ref 0.35–4.50)

## 2019-03-18 MED ORDER — BASAGLAR KWIKPEN 100 UNIT/ML ~~LOC~~ SOPN: 35.0000 [IU] | PEN_INJECTOR | Freq: Every day | SUBCUTANEOUS | 2 refills | Status: DC

## 2019-03-18 NOTE — Patient Instructions (Addendum)
Stop by the lab prior to leaving today. I will notify you of your results once received.   Follow up with Dr. Nicolasa Ducking as scheduled.  Hold your atorvastatin cholesterol medication as discussed.  Schedule an eye appointment.  We've increased your basaglar to 35 units nightly. Keep an eye on your blood sugars and notify me if they continue to run above 150 in 2-3 weeks.  Please schedule a follow up appointment in 3 months.  It was a pleasure to see you today!

## 2019-03-18 NOTE — Assessment & Plan Note (Signed)
Compliant to PT. Continue gabapentin and celebrex. Work on weight loss.

## 2019-03-18 NOTE — Assessment & Plan Note (Signed)
Evident on exam today, working with psychiatry. Continue current regimen.

## 2019-03-18 NOTE — Assessment & Plan Note (Signed)
Now following with psychiatry who is working on controlling symptoms. Continue current regimen.

## 2019-03-18 NOTE — Assessment & Plan Note (Signed)
Compliant to vitamin D regimen, continue same. Repeat vitamin D pending.

## 2019-03-18 NOTE — Assessment & Plan Note (Signed)
Acute. Exam today benign. Check UA, she cannot provide sample today and will return later this week.  Appears to be more pelvic than abdomen. She does have FH of diverticulitis. No CT or colonoscopy on file.  Consider ovarian involvement, MSK, PAD. Await UA. May need further imaging.

## 2019-03-18 NOTE — Assessment & Plan Note (Addendum)
A1C today of 9.1 which is uncontrolled. Increase Basaglar to 35 units HS.  Continue other mediations as prescribed.  She will notify if glucose levels remain above 150 after 2-3 weeks, if so we will further increase Basaglar. She will update.  Follow up in 3 months.

## 2019-03-18 NOTE — Assessment & Plan Note (Signed)
Chronic and continued. Continue PT and medications. Work on weight loss.

## 2019-03-18 NOTE — Progress Notes (Signed)
Subjective:    Patient ID: Brandy [name], female    DOB: [birth year], 53 y.o.   MRN: [id number]  HPI  Brandy [name] is a 53 year old female who presents today for follow up.  1) Essential Hypertension/PAD/Venous Insufficiency: Currently managed on lisinopril-HCTZ 20-12.5 mg. She is not checking her BP at home. She denies dizziness and chest pain.   She saw cardiology yesterday who decided to forego echocardiogram. She had an ECG yesterday which looked "the same as it did 9 years ago".  Her cardiologist is wanting to send her to a nutritionist and pulmonary rehab, these referrals are pending.   She was told to come off of atorvastatin for four weeks due to potential effects of drowsiness and arthralgias. She would like to go for 6 weeks.   BP Readings from Last 3 Encounters:  03/18/19 134/80  03/17/19 130/84  12/10/18 134/80   2) Type 2 Diabetes:   Current medications include: Glipizide 10 mg BID, Metformin ER 1000 mg, Basaglar 25 units HS. She increased her Basaglar to 30 units in mid October 2020 as her glucose readings weren't getting below 200 on average.   She is checking her blood glucose 1-2 times daily and is getting readings of:  AM fasting: mid 100's Occasionally before bedtime: high 100's to low 200's.   Last A1C: 8.9 in August 2020, 9.1 today Last Eye Exam: No recent exam Last Foot Exam: Due Pneumonia Vaccination: Completed in 2020 ACE/ARB: Lisinopril  Statin: atorvastatin  3) Anxiety and Depression: Currently managed on buspirone 10 mg BID, duloxetine 90 mg, trazodone 100 mg. She is following with Brandy [name] with her next visit being tomorrow. Overall she has good days and bad days, feels like she's somewhat better. She stopped taking Trazodone as it causes her to sleep to "hard" in the morning, harder to get up during the day. She is thinking about asking about stimulant treatment as her cardiologist recommended.   4) Chronic Pain/Osteoarthritis: Chronic pain to  entire body. Currently managed on gabapentin and celebrex. She is currently active in PT twice weekly, doing both water and land aerobics.   5) Left Groin Pain: Acute and intermittent since September 2020. She describes her pain as a deep "hurt". She has chronic diarrhea without acute changes, denies bloody stools, vomiting but does have chronic nausea, dysuria, frequency. She endorses a family history of diverticulitis, she has never undergone colonoscopy and declines. She has never had a hysterectomy.    Review of Systems  Respiratory: Negative for shortness of breath.   Cardiovascular: Negative for chest pain.  Gastrointestinal: Negative for abdominal pain, blood in stool, constipation, nausea and vomiting.  Genitourinary:       Left groin pain  Musculoskeletal: Positive for arthralgias.  Neurological: Negative for dizziness.  Psychiatric/Behavioral:       See HPI       Past Medical History:  Diagnosis Date   Depression    Dysmetabolic syndrome X    Hypertension    Hypertriglyceridemia    Obesity    Tetanus vaccine causing adverse effect in therapeutic use      Social History   Socioeconomic History   Marital status: Married    Spouse name: Not on file   Number of children: 3   Years of education: Not on file   Highest education level: Not on file  Occupational History   Occupation: Designer, jewellery    Employer: [employer] resource strain: Not  on file   Food insecurity    Worry: Not on file    Inability: Not on file   Transportation needs    Medical: Not on file    Non-medical: Not on file  Tobacco Use   Smoking status: Never Smoker   Smokeless tobacco: Never Used  Substance and Sexual Activity   Alcohol use: Yes    Comment: occasional   Drug use: No   Sexual activity: Not on file  Lifestyle   Physical activity    Days per week: Not on file    Minutes per session: Not on file   Stress: Not on file    Relationships   Social connections    Talks on phone: Not on file    Gets together: Not on file    Attends religious service: Not on file    Active member of club or organization: Not on file    Attends meetings of clubs or organizations: Not on file    Relationship status: Not on file   Intimate partner violence    Fear of current or ex partner: Not on file    Emotionally abused: Not on file    Physically abused: Not on file    Forced sexual activity: Not on file  Other Topics Concern   Not on file  Social History Narrative   Best boyinancial analyst      Married; 3 kids (middle child-severely autistic)      No regular exercise    Past Surgical History:  Procedure Laterality Date   BONE TUMOR EXCISION  1975   Right leg    Family History  Problem Relation Age of Onset   Rheum arthritis Mother    COPD Mother    Hypertension Father    Heart disease Maternal Grandmother    Stroke Maternal Grandmother    Alcohol abuse Maternal Grandmother    Alcohol abuse Maternal Grandfather    Aneurysm Paternal Grandmother    Autism Son        severe    Allergies  Allergen Reactions   Diphenhydramine Hcl     REACTION: minor rash   Penicillins     REACTION: reaction as a child    Current Outpatient Medications on File Prior to Visit  Medication Sig Dispense Refill   atorvastatin (LIPITOR) 10 MG tablet TAKE 1 TABLET (10 MG TOTAL) BY MOUTH DAILY. FOR CHOLESTEROL. 90 tablet 0   busPIRone (BUSPAR) 10 MG tablet TAKE 1 TABLET (10 MG TOTAL) BY MOUTH 2 (TWO) TIMES DAILY. FOR ANXIETY. 180 tablet 1   celecoxib (CELEBREX) 100 MG capsule Take 100 mg by mouth 2 (two) times daily.     cetirizine (ZYRTEC) 10 MG tablet Take 1 tablet (10 mg total) by mouth daily. 30 tablet 11   Continuous Blood Gluc Receiver (DEXCOM G6 RECEIVER) DEVI Inject 1 Device into the skin daily. 4 each 1   Continuous Blood Gluc Sensor (DEXCOM G6 SENSOR) MISC Inject 1 Device into the skin daily. 3 each 1    Continuous Blood Gluc Transmit (DEXCOM G6 TRANSMITTER) MISC Inject 1 Device into the skin daily. 4 each 1   DULoxetine (CYMBALTA) 30 MG capsule Take 30 mg by mouth daily.      DULoxetine (CYMBALTA) 60 MG capsule Take 60 mg by mouth daily.     folic acid (FOLVITE) 1 MG tablet Take 1 mg by mouth daily.     gabapentin (NEURONTIN) 300 MG capsule Take 300 mg by mouth 3 (three) times daily.  glipiZIDE (GLUCOTROL) 10 MG tablet Take 1 tablet (10 mg total) by mouth 2 (two) times daily before a meal. For diabetes. 180 tablet 3   hydrocortisone 2.5 % cream Apply topically 2 (two) times daily.     Insulin Pen Needle (PEN NEEDLES) 31G X 6 MM MISC Use nightly with insulin. 100 each 2   lisinopril-hydrochlorothiazide (ZESTORETIC) 20-12.5 MG tablet Take 1 tablet by mouth daily. For blood pressure. 90 tablet 0   metFORMIN (GLUCOPHAGE-XR) 500 MG 24 hr tablet TAKE 2 TABLETS (1,000 MG TOTAL) BY MOUTH DAILY WITH BREAKFAST. FOR DIABETES. 180 tablet 1   metroNIDAZOLE (METROCREAM) 0.75 % cream Apply 1 application topically daily.     traZODone (DESYREL) 100 MG tablet Take 100 mg by mouth at bedtime.      Vitamin D, Ergocalciferol, (DRISDOL) 1.25 MG (50000 UT) CAPS capsule Take 1 capsule (50,000 Units total) by mouth every 7 (seven) days. 12 capsule 1   No current facility-administered medications on file prior to visit.     BP 134/80    Pulse (!) 105    Temp 97.7 F (36.5 C) (Temporal)    Ht 5\' 7"  (1.702 m)    Wt (!) 380 lb 8 oz (172.6 kg)    LMP  (LMP Unknown)    SpO2 98%    BMI 59.59 kg/m    Objective:   Physical Exam  Constitutional: She appears well-nourished.  Neck: Neck supple.  Cardiovascular: Normal rate and regular rhythm.  Respiratory: Effort normal and breath sounds normal.  GI: Soft. Bowel sounds are normal. There is no abdominal tenderness.  Skin: Skin is warm and dry.  Psychiatric: She has a normal mood and affect.           Assessment & Plan:

## 2019-03-18 NOTE — Assessment & Plan Note (Signed)
Borderline today. Discussed to monitor home readings. Continue current regimen.

## 2019-03-19 ENCOUNTER — Ambulatory Visit (INDEPENDENT_AMBULATORY_CARE_PROVIDER_SITE_OTHER): Payer: BC Managed Care – PPO | Admitting: Psychology

## 2019-03-19 DIAGNOSIS — M25512 Pain in left shoulder: Secondary | ICD-10-CM | POA: Diagnosis not present

## 2019-03-19 DIAGNOSIS — M79605 Pain in left leg: Secondary | ICD-10-CM | POA: Diagnosis not present

## 2019-03-19 DIAGNOSIS — F411 Generalized anxiety disorder: Secondary | ICD-10-CM | POA: Diagnosis not present

## 2019-03-19 DIAGNOSIS — F3289 Other specified depressive episodes: Secondary | ICD-10-CM

## 2019-03-19 DIAGNOSIS — M545 Low back pain: Secondary | ICD-10-CM | POA: Diagnosis not present

## 2019-03-19 DIAGNOSIS — F5105 Insomnia due to other mental disorder: Secondary | ICD-10-CM | POA: Diagnosis not present

## 2019-03-19 DIAGNOSIS — F331 Major depressive disorder, recurrent, moderate: Secondary | ICD-10-CM | POA: Diagnosis not present

## 2019-03-19 DIAGNOSIS — M79604 Pain in right leg: Secondary | ICD-10-CM | POA: Diagnosis not present

## 2019-03-19 LAB — WET PREP BY MOLECULAR PROBE
Candida species: NOT DETECTED
Gardnerella vaginalis: NOT DETECTED
MICRO NUMBER:: 1114704
SPECIMEN QUALITY:: ADEQUATE
Trichomonas vaginosis: NOT DETECTED

## 2019-03-21 ENCOUNTER — Other Ambulatory Visit: Payer: Self-pay | Admitting: Primary Care

## 2019-03-21 DIAGNOSIS — I10 Essential (primary) hypertension: Secondary | ICD-10-CM

## 2019-03-24 ENCOUNTER — Ambulatory Visit (INDEPENDENT_AMBULATORY_CARE_PROVIDER_SITE_OTHER): Payer: BC Managed Care – PPO | Admitting: Psychology

## 2019-03-24 DIAGNOSIS — F3289 Other specified depressive episodes: Secondary | ICD-10-CM | POA: Diagnosis not present

## 2019-03-25 ENCOUNTER — Ambulatory Visit: Payer: BC Managed Care – PPO | Admitting: *Deleted

## 2019-03-30 ENCOUNTER — Other Ambulatory Visit: Payer: Self-pay

## 2019-03-30 ENCOUNTER — Encounter: Payer: BC Managed Care – PPO | Attending: Cardiovascular Disease | Admitting: *Deleted

## 2019-03-30 ENCOUNTER — Encounter: Payer: Self-pay | Admitting: *Deleted

## 2019-03-30 VITALS — BP 130/94 | Ht 67.0 in | Wt 375.6 lb

## 2019-03-30 DIAGNOSIS — E781 Pure hyperglyceridemia: Secondary | ICD-10-CM | POA: Diagnosis not present

## 2019-03-30 DIAGNOSIS — Z6841 Body Mass Index (BMI) 40.0 and over, adult: Secondary | ICD-10-CM | POA: Insufficient documentation

## 2019-03-30 DIAGNOSIS — Z713 Dietary counseling and surveillance: Secondary | ICD-10-CM | POA: Insufficient documentation

## 2019-03-30 DIAGNOSIS — E1159 Type 2 diabetes mellitus with other circulatory complications: Secondary | ICD-10-CM | POA: Diagnosis not present

## 2019-03-30 DIAGNOSIS — I10 Essential (primary) hypertension: Secondary | ICD-10-CM | POA: Diagnosis not present

## 2019-03-30 DIAGNOSIS — E1165 Type 2 diabetes mellitus with hyperglycemia: Secondary | ICD-10-CM

## 2019-03-30 NOTE — Patient Instructions (Signed)
Check blood sugars 2 x day before breakfast and 2 hrs after supper every day Bring blood sugar records to the next class  Exercise: Continue physical therapy for  45  minutes  2 days a week  Eat 3 meals day, 2  snacks a day Space meals 4-6 hours apart Don't skip meals Allow 2-3 hours between meals and snacks Avoid sugar sweetened drinks (soda, coffee)  Make an eye doctor appointment  Carry fast acting glucose and a snack at all times Rotate injection sites  Return for classes on:

## 2019-03-31 DIAGNOSIS — M79604 Pain in right leg: Secondary | ICD-10-CM | POA: Diagnosis not present

## 2019-03-31 DIAGNOSIS — M545 Low back pain: Secondary | ICD-10-CM | POA: Diagnosis not present

## 2019-03-31 DIAGNOSIS — M25512 Pain in left shoulder: Secondary | ICD-10-CM | POA: Diagnosis not present

## 2019-03-31 DIAGNOSIS — M79605 Pain in left leg: Secondary | ICD-10-CM | POA: Diagnosis not present

## 2019-03-31 NOTE — Progress Notes (Signed)
Diabetes Self-Management Education  Visit Type: First/Initial  Appt. Start Time: 1600 Appt. End Time: 1715  03/30/2019  [name], identified by name and date of birth, is a 53 y.o. female with a diagnosis of Diabetes: Type 2.   ASSESSMENT  Blood pressure (!) 130/94, height 5\' 7"  (1.702 m), weight (!) 375 lb 9.6 oz (170.4 kg). Body mass index is 58.83 kg/m.  Diabetes Self-Management Education - 03/30/19 1723      Visit Information   Visit Type  First/Initial      Initial Visit   Diabetes Type  Type 2    Are you currently following a meal plan?  Yes    What type of meal plan do you follow?  "try to make better choices and portions"    Are you taking your medications as prescribed?  Yes    Date Diagnosed  Oct 2019      Health Coping   How would you rate your overall health?  Poor      Psychosocial Assessment   Patient Belief/Attitude about Diabetes  Defeat/Burnout   "my health makes me feel pain and helpless - much older than my age"   Self-care barriers  None    Self-management support  Doctor's office;Family    Patient Concerns  Nutrition/Meal planning;Glycemic Control;Medication;Monitoring;Weight Control;Healthy Lifestyle    Special Needs  None    Preferred Learning Style  Auditory;Visual;Hands on    Learning Readiness  Ready    How often do you need to have someone help you when you read instructions, pamphlets, or other written materials from your doctor or pharmacy?  1 - Never    What is the last grade level you completed in school?  some college      Pre-Education Assessment   Patient understands the diabetes disease and treatment process.  Needs Instruction    Patient understands incorporating nutritional management into lifestyle.  Needs Instruction    Patient undertands incorporating physical activity into lifestyle.  Needs Instruction    Patient understands using medications safely.  Needs Instruction    Patient understands monitoring blood glucose,  interpreting and using results  Needs Review    Patient understands prevention, detection, and treatment of acute complications.  Needs Instruction    Patient understands prevention, detection, and treatment of chronic complications.  Needs Instruction    Patient understands how to develop strategies to address psychosocial issues.  Needs Instruction    Patient understands how to develop strategies to promote health/change behavior.  Needs Instruction      Complications   Last HgB A1C per patient/outside source  9.1 %   03/18/2019   Fasting Blood glucose range (mg/dL)  03/20/2019   Pt reports FBG's 190's mg/dL   Have you had a dilated eye exam in the past 12 months?  No    Have you had a dental exam in the past 12 months?  No    Are you checking your feet?  Yes    How many days per week are you checking your feet?  7      Dietary Intake   Breakfast  eats late breakfast - cheese omelet with peppers, mushrooms and onions; sausage and bacon; English muffin; bagel    Snack (morning)  pt reports "multiple snacks throughout the day"    Lunch  sometimes skips lunch or has tuna or chicken salad, soup, sub, frozen dinner    Snack (afternoon)  nuts, popcorn, chips and dip, veggies and dip, fruit (banana,  apple, peach, melon, nectarine, pears    Dinner  chicken, beef, pork, tuna, potatoes, peas, beans, corn, some pasta, occasional rice, green beans, cukes, carrots,    Snack (evening)  same as earlier    Beverage(s)  soda, iced coffe with sugar, unsweetened tea, crystal lite, water      Exercise   Exercise Type  Other (comment)   going to PT   How many days per week to you exercise?  2    How many minutes per day do you exercise?  45    Total minutes per week of exercise  90      Patient Education   Previous Diabetes Education  No    Disease state   Definition of diabetes, type 1 and 2, and the diagnosis of diabetes;Factors that contribute to the development of diabetes    Nutrition management    Role of diet in the treatment of diabetes and the relationship between the three main macronutrients and blood glucose level;Food label reading, portion sizes and measuring food.;Reviewed blood glucose goals for pre and post meals and how to evaluate the patients' food intake on their blood glucose level.    Physical activity and exercise   Role of exercise on diabetes management, blood pressure control and cardiac health.    Medications  Taught/reviewed insulin injection, site rotation, insulin storage and needle disposal.;Reviewed patients medication for diabetes, action, purpose, timing of dose and side effects.    Monitoring  Purpose and frequency of SMBG.;Taught/discussed recording of test results and interpretation of SMBG.;Identified appropriate SMBG and/or A1C goals.    Acute complications  Taught treatment of hypoglycemia - the 15 rule.    Chronic complications  Retinopathy and reason for yearly dilated eye exams    Psychosocial adjustment  Role of stress on diabetes;Identified and addressed patients feelings and concerns about diabetes      Individualized Goals (developed by patient)   Reducing Risk  Improve blood sugars Decrease medications Prevent diabetes complications Lose weight Lead a healthier lifestyle     Outcomes   Expected Outcomes  Demonstrated interest in learning. Expect positive outcomes       Individualized Plan for Diabetes Self-Management Training:   Learning Objective:  Patient will have a greater understanding of diabetes self-management. Patient education plan is to attend individual and/or group sessions per assessed needs and concerns.   Plan:   Patient Instructions  Check blood sugars 2 x day before breakfast and 2 hrs after supper every day Bring blood sugar records to the next class Exercise: Continue physical therapy for  45  minutes  2 days a week Eat 3 meals day, 2  snacks a day Space meals 4-6 hours apart Don't skip meals Allow 2-3 hours  between meals and snacks Avoid sugar sweetened drinks (soda, coffee) Make an eye doctor appointment Carry fast acting glucose and a snack at all times Rotate injection sites  Expected Outcomes:  Demonstrated interest in learning. Expect positive outcomes  Education material provided:  General Meal Planning Guidelines Simple Meal Plan Glucose tablets Symptoms, causes and treatments of Hypoglycemia  If problems or questions, patient to contact team via:  Brandy [name], [place], [place], CDE [phone]  Future DSME appointment:  April 02, 2019 for Diabetes Class 1

## 2019-04-01 ENCOUNTER — Other Ambulatory Visit: Payer: Self-pay

## 2019-04-01 ENCOUNTER — Encounter: Payer: BC Managed Care – PPO | Attending: Cardiovascular Disease | Admitting: *Deleted

## 2019-04-01 DIAGNOSIS — E1159 Type 2 diabetes mellitus with other circulatory complications: Secondary | ICD-10-CM | POA: Insufficient documentation

## 2019-04-01 DIAGNOSIS — I872 Venous insufficiency (chronic) (peripheral): Secondary | ICD-10-CM | POA: Insufficient documentation

## 2019-04-01 DIAGNOSIS — I5032 Chronic diastolic (congestive) heart failure: Secondary | ICD-10-CM

## 2019-04-01 DIAGNOSIS — R0600 Dyspnea, unspecified: Secondary | ICD-10-CM | POA: Insufficient documentation

## 2019-04-01 DIAGNOSIS — Z713 Dietary counseling and surveillance: Secondary | ICD-10-CM | POA: Insufficient documentation

## 2019-04-01 DIAGNOSIS — R0609 Other forms of dyspnea: Secondary | ICD-10-CM

## 2019-04-01 DIAGNOSIS — Z6841 Body Mass Index (BMI) 40.0 and over, adult: Secondary | ICD-10-CM | POA: Insufficient documentation

## 2019-04-01 DIAGNOSIS — I509 Heart failure, unspecified: Secondary | ICD-10-CM | POA: Insufficient documentation

## 2019-04-01 NOTE — Progress Notes (Signed)
Completed virtual orientation today.  Documentation for diagnosis can be found in [facility] encounter 11/17.  EP eval scheduled for 12/7 at 3pm.

## 2019-04-02 ENCOUNTER — Ambulatory Visit: Payer: BC Managed Care – PPO

## 2019-04-02 ENCOUNTER — Encounter: Payer: Self-pay | Admitting: Dietician

## 2019-04-02 NOTE — Progress Notes (Signed)
Patient cancelled her attendance at diabetes class 1 today due to illness. Will contact patient at a later time to reschedule the class series.

## 2019-04-03 ENCOUNTER — Ambulatory Visit (INDEPENDENT_AMBULATORY_CARE_PROVIDER_SITE_OTHER): Payer: BC Managed Care – PPO | Admitting: Psychology

## 2019-04-03 ENCOUNTER — Telehealth: Payer: Self-pay | Admitting: Dietician

## 2019-04-03 DIAGNOSIS — G8929 Other chronic pain: Secondary | ICD-10-CM

## 2019-04-03 DIAGNOSIS — F3289 Other specified depressive episodes: Secondary | ICD-10-CM | POA: Diagnosis not present

## 2019-04-03 DIAGNOSIS — M2550 Pain in unspecified joint: Secondary | ICD-10-CM

## 2019-04-03 DIAGNOSIS — M5442 Lumbago with sciatica, left side: Secondary | ICD-10-CM

## 2019-04-03 NOTE — Telephone Encounter (Signed)
Spoke with patient by phone to reschedule her diabetes classes; she will plan to come to class 1 on Monday 04/06/19, and then classes 2 and 3 on Thursday mornings 12/10 and 12/17 as previously scheduled.

## 2019-04-05 DIAGNOSIS — F331 Major depressive disorder, recurrent, moderate: Secondary | ICD-10-CM | POA: Diagnosis not present

## 2019-04-05 DIAGNOSIS — F5105 Insomnia due to other mental disorder: Secondary | ICD-10-CM | POA: Diagnosis not present

## 2019-04-05 DIAGNOSIS — F411 Generalized anxiety disorder: Secondary | ICD-10-CM | POA: Diagnosis not present

## 2019-04-06 ENCOUNTER — Encounter: Payer: Self-pay | Admitting: Dietician

## 2019-04-06 ENCOUNTER — Encounter: Payer: BC Managed Care – PPO | Admitting: Dietician

## 2019-04-06 ENCOUNTER — Other Ambulatory Visit: Payer: Self-pay

## 2019-04-06 VITALS — Ht 67.0 in | Wt 375.1 lb

## 2019-04-06 VITALS — Ht 67.0 in | Wt 374.8 lb

## 2019-04-06 DIAGNOSIS — I872 Venous insufficiency (chronic) (peripheral): Secondary | ICD-10-CM | POA: Diagnosis not present

## 2019-04-06 DIAGNOSIS — E1159 Type 2 diabetes mellitus with other circulatory complications: Secondary | ICD-10-CM | POA: Diagnosis not present

## 2019-04-06 DIAGNOSIS — R0609 Other forms of dyspnea: Secondary | ICD-10-CM

## 2019-04-06 DIAGNOSIS — I5032 Chronic diastolic (congestive) heart failure: Secondary | ICD-10-CM

## 2019-04-06 DIAGNOSIS — Z6841 Body Mass Index (BMI) 40.0 and over, adult: Secondary | ICD-10-CM | POA: Diagnosis not present

## 2019-04-06 DIAGNOSIS — R0600 Dyspnea, unspecified: Secondary | ICD-10-CM

## 2019-04-06 DIAGNOSIS — E119 Type 2 diabetes mellitus without complications: Secondary | ICD-10-CM | POA: Diagnosis not present

## 2019-04-06 DIAGNOSIS — Z794 Long term (current) use of insulin: Secondary | ICD-10-CM | POA: Diagnosis not present

## 2019-04-06 DIAGNOSIS — Z713 Dietary counseling and surveillance: Secondary | ICD-10-CM | POA: Diagnosis not present

## 2019-04-06 DIAGNOSIS — I509 Heart failure, unspecified: Secondary | ICD-10-CM | POA: Diagnosis not present

## 2019-04-06 DIAGNOSIS — E1165 Type 2 diabetes mellitus with hyperglycemia: Secondary | ICD-10-CM

## 2019-04-06 MED ORDER — CELECOXIB 100 MG PO CAPS: 100.0000 mg | ORAL_CAPSULE | Freq: Two times a day (BID) | ORAL | 1 refills | Status: DC

## 2019-04-06 NOTE — Patient Instructions (Addendum)
Patient Instructions  Patient Details  Name: Brandy [name] MRN: [id number] Date of Birth: 1965/10/06 Referring Provider:  Minna Merritts, MD  Below are your personal goals for exercise, nutrition, and risk factors. Our goal is to help you stay on track towards obtaining and maintaining these goals. We will be discussing your progress on these goals with you throughout the program.  Initial Exercise Prescription: Initial Exercise Prescription - 04/06/19 1700      Date of Initial Exercise RX and Referring Provider   Date  04/06/19    Referring Provider  Gollan      Treadmill   MPH  1    Grade  0    Minutes  15   rest as needed   METs  1      NuStep   Level  1    SPM  80    Minutes  15    METs  1      Arm Ergometer   Level  1    RPM  25    Minutes  15    METs  1      Biostep-RELP   Level  1    SPM  50    Minutes  15      Prescription Details   Frequency (times per week)  3    Duration  Progress to 30 minutes of continuous aerobic without signs/symptoms of physical distress      Intensity   THRR 40-80% of Max Heartrate  121-150    Ratings of Perceived Exertion  11-15    Perceived Dyspnea  0-4      Resistance Training   Training Prescription  Yes    Weight  3 lb    Reps  10-15       Exercise Goals: Frequency: Be able to perform aerobic exercise two to three times per week in program working toward 2-5 days per week of home exercise.  Intensity: Work with a perceived exertion of 11 (fairly light) - 15 (hard) while following your exercise prescription.  We will make changes to your prescription with you as you progress through the program.   Duration: Be able to do 30 to 45 minutes of continuous aerobic exercise in addition to a 5 minute warm-up and a 5 minute cool-down routine.   Nutrition Goals: Your personal nutrition goals will be established when you do your nutrition analysis with the dietician.  The following are general nutrition guidelines to  follow: Cholesterol < 200mg /day Sodium < 1500mg /day Fiber: Women over 50 yrs - 21 grams per day  Personal Goals: Personal Goals and Risk Factors at Admission - 04/06/19 1714      Core Components/Risk Factors/Patient Goals on Admission    Weight Management  Yes    Intervention  Weight Management/Obesity: Establish reasonable short term and long term weight goals.    Admit Weight  374 lb 12.8 oz (170 kg)    Goal Weight: Short Term  365 lb (165.6 kg)    Goal Weight: Long Term  355 lb (161 kg)    Expected Outcomes  Short Term: Continue to assess and modify interventions until short term weight is achieved;Long Term: Adherence to nutrition and physical activity/exercise program aimed toward attainment of established weight goal;Weight Maintenance: Understanding of the daily nutrition guidelines, which includes 25-35% calories from fat, 7% or less cal from saturated fats, less than 200mg  cholesterol, less than 1.5gm of sodium, & 5 or more servings of fruits and vegetables  daily;Weight Loss: Understanding of general recommendations for a balanced deficit meal plan, which promotes 1-2 lb weight loss per week and includes a negative energy balance of [phone] kcal/d;Understanding recommendations for meals to include 15-35% energy as protein, 25-35% energy from fat, 35-60% energy from carbohydrates, less than 200mg  of dietary cholesterol, 20-35 gm of total fiber daily;Understanding of distribution of calorie intake throughout the day with the consumption of 4-5 meals/snacks;Weight Gain: Understanding of general recommendations for a high calorie, high protein meal plan that promotes weight gain by distributing calorie intake throughout the day with the consumption for 4-5 meals, snacks, and/or supplements       Tobacco Use Initial Evaluation: Social History   Tobacco Use  Smoking Status Never Smoker  Smokeless Tobacco Never Used    Exercise Goals and Review: Exercise Goals    Row Name 04/06/19 1715              Exercise Goals   Increase Physical Activity  Yes       Intervention  Provide advice, education, support and counseling about physical activity/exercise needs.;Develop an individualized exercise prescription for aerobic and resistive training based on initial evaluation findings, risk stratification, comorbidities and participant's personal goals.       Expected Outcomes  Short Term: Attend rehab on a regular basis to increase amount of physical activity.;Long Term: Add in home exercise to make exercise part of routine and to increase amount of physical activity.;Long Term: Exercising regularly at least 3-5 days a week.       Increase Strength and Stamina  Yes       Intervention  Provide advice, education, support and counseling about physical activity/exercise needs.;Develop an individualized exercise prescription for aerobic and resistive training based on initial evaluation findings, risk stratification, comorbidities and participant's personal goals.       Expected Outcomes  Short Term: Increase workloads from initial exercise prescription for resistance, speed, and METs.;Short Term: Perform resistance training exercises routinely during rehab and add in resistance training at home;Long Term: Improve cardiorespiratory fitness, muscular endurance and strength as measured by increased METs and functional capacity (14/07/20)       Able to understand and use rate of perceived exertion (RPE) scale  Yes       Expected Outcomes  Short Term: Able to use RPE daily in rehab to express subjective intensity level;Long Term:  Able to use RPE to guide intensity level when exercising independently       Able to understand and use Dyspnea scale  Yes       Intervention  Provide education and explanation on how to use Dyspnea scale       Expected Outcomes  Short Term: Able to use Dyspnea scale daily in rehab to express subjective sense of shortness of breath during exertion;Long Term: Able to use Dyspnea  scale to guide intensity level when exercising independently       Knowledge and understanding of Target Heart Rate Range (THRR)  Yes       Intervention  Provide education and explanation of THRR including how the numbers were predicted and where they are located for reference       Expected Outcomes  Short Term: Able to state/look up THRR;Short Term: Able to use daily as guideline for intensity in rehab;Long Term: Able to use THRR to govern intensity when exercising independently       Able to check pulse independently  Yes       Intervention  Provide education and  demonstration on how to check pulse in carotid and radial arteries.;Review the importance of being able to check your own pulse for safety during independent exercise       Expected Outcomes  Short Term: Able to explain why pulse checking is important during independent exercise;Long Term: Able to check pulse independently and accurately       Understanding of Exercise Prescription  Yes       Intervention  Provide education, explanation, and written materials on patient's individual exercise prescription       Expected Outcomes  Short Term: Able to explain program exercise prescription;Long Term: Able to explain home exercise prescription to exercise independently          Copy of goals given to participant.

## 2019-04-06 NOTE — Progress Notes (Signed)

## 2019-04-06 NOTE — Progress Notes (Signed)
Pulmonary Individual Treatment Plan  Patient Details  Name: Brandy [name] MRN: [id number] Date of Birth: 04/29/1966 Referring Provider:     Pulmonary Rehab from 04/06/2019 in [facility] Cardiac and Pulmonary Rehab  Referring Provider  Gollan      Initial Encounter Date:    Pulmonary Rehab from 04/06/2019 in [facility] Cardiac and Pulmonary Rehab  Date  04/06/19      Visit Diagnosis: Dyspnea on exertion  Heart failure, diastolic, chronic ([place])  Patient's Home Medications on Admission:  Current Outpatient Medications:  .  aspirin EC 81 MG tablet, Take 81 mg by mouth daily., Disp: , Rfl:  .  atorvastatin (LIPITOR) 10 MG tablet, TAKE 1 TABLET (10 MG TOTAL) BY MOUTH DAILY. FOR CHOLESTEROL. (Patient not taking: Reported on 03/30/2019), Disp: 90 tablet, Rfl: 0 .  busPIRone (BUSPAR) 10 MG tablet, TAKE 1 TABLET (10 MG TOTAL) BY MOUTH 2 (TWO) TIMES DAILY. FOR ANXIETY. (Patient not taking: Reported on 03/30/2019), Disp: 180 tablet, Rfl: 1 .  celecoxib (CELEBREX) 100 MG capsule, Take 1 capsule (100 mg total) by mouth 2 (two) times daily. As needed for pain., Disp: 180 capsule, Rfl: 1 .  cetirizine (ZYRTEC) 10 MG tablet, Take 1 tablet (10 mg total) by mouth daily. (Patient taking differently: Take 10 mg by mouth daily as needed. ), Disp: 30 tablet, Rfl: 11 .  cholecalciferol (VITAMIN D3) 25 MCG (1000 UT) tablet, Take 1,000 Units by mouth 2 (two) times daily., Disp: , Rfl:  .  Continuous Blood Gluc Receiver (DEXCOM G6 RECEIVER) DEVI, Inject 1 Device into the skin daily. (Patient not taking: Reported on 03/30/2019), Disp: 4 each, Rfl: 1 .  Continuous Blood Gluc Sensor (DEXCOM G6 SENSOR) MISC, Inject 1 Device into the skin daily. (Patient not taking: Reported on 03/30/2019), Disp: 3 each, Rfl: 1 .  Continuous Blood Gluc Transmit (DEXCOM G6 TRANSMITTER) MISC, Inject 1 Device into the skin daily. (Patient not taking: Reported on 03/30/2019), Disp: 4 each, Rfl: 1 .  Cyanocobalamin (VITAMIN B-12 PO), Take 1 tablet by  mouth daily., Disp: , Rfl:  .  DULoxetine (CYMBALTA) 30 MG capsule, Take 30 mg by mouth daily. , Disp: , Rfl:  .  DULoxetine (CYMBALTA) 60 MG capsule, Take 60 mg by mouth daily., Disp: , Rfl:  .  folic acid (FOLVITE) 1 MG tablet, Take 1 mg by mouth daily., Disp: , Rfl:  .  gabapentin (NEURONTIN) 300 MG capsule, Take 300 mg by mouth 3 (three) times daily., Disp: , Rfl:  .  glipiZIDE (GLUCOTROL) 10 MG tablet, Take 1 tablet (10 mg total) by mouth 2 (two) times daily before a meal. For diabetes., Disp: 180 tablet, Rfl: 3 .  hydrocortisone 2.5 % cream, Apply topically 2 (two) times daily., Disp: , Rfl:  .  Insulin Glargine (BASAGLAR KWIKPEN) 100 UNIT/ML SOPN, Inject 0.35 mLs (35 Units total) into the skin at bedtime., Disp: 15 mL, Rfl: 2 .  Insulin Pen Needle (PEN NEEDLES) 31G X 6 MM MISC, Use nightly with insulin., Disp: 100 each, Rfl: 2 .  lisinopril-hydrochlorothiazide (ZESTORETIC) 20-12.5 MG tablet, TAKE 1 TABLET BY MOUTH DAILY FOR BLOOD PRESSURE, Disp: 90 tablet, Rfl: 1 .  metFORMIN (GLUCOPHAGE-XR) 500 MG 24 hr tablet, TAKE 2 TABLETS (1,000 MG TOTAL) BY MOUTH DAILY WITH BREAKFAST. FOR DIABETES., Disp: 180 tablet, Rfl: 1 .  metroNIDAZOLE (METROCREAM) 0.75 % cream, Apply 1 application topically daily., Disp: , Rfl:  .  Multiple Minerals-Vitamins (CALCIUM-MAGNESIUM-ZINC-D3 PO), Take 1 tablet by mouth daily., Disp: , Rfl:  .  traZODone (DESYREL) 50 MG tablet, Take 50 mg by mouth at bedtime as needed., Disp: , Rfl:  .  Vitamin D, Ergocalciferol, (DRISDOL) 1.25 MG (50000 UT) CAPS capsule, Take 1 capsule (50,000 Units total) by mouth every 7 (seven) days., Disp: 12 capsule, Rfl: 1  Past Medical History: Past Medical History:  Diagnosis Date  . Anxiety   . Depression   . Diabetes mellitus without complication ([place])   . Dysmetabolic syndrome X   . Hypertension   . Hypertriglyceridemia   . Obesity   . Tetanus vaccine causing adverse effect in therapeutic use     Tobacco Use: Social History    Tobacco Use  Smoking Status Never Smoker  Smokeless Tobacco Never Used    Labs: Recent Review Flowsheet Data    Labs for ITP Cardiac and Pulmonary Rehab Latest Ref Rng & Units 07/15/2018 07/15/2018 08/29/2018 12/10/2018 03/18/2019   Cholestrol 0 - 200 mg/dL - - - - 160   LDLCALC 0 - 99 mg/dL - - - - 71   LDLDIRECT mg/dL - - - - -   HDL >39.00 mg/dL - - - - 56.70   Trlycerides 0.0 - 149.0 mg/dL - - - - 159.0(H)   Hemoglobin A1c 4.0 - 5.6 % - - 8.5(H) 8.9(A) 9.1(A)   PHART 7.350 - 7.450 7.44 - - - -   PCO2ART 32.0 - 48.0 mmHg 33 - - - -   HCO3 20.0 - 28.0 mmol/L 22.4 - - - -   ACIDBASEDEF 0.0 - 2.0 mmol/L 1.1 - - - -   O2SAT % 97.9 97.9 - - -       Pulmonary Assessment Scores: Pulmonary Assessment Scores    Row Name 04/06/19 1704         ADL UCSD   SOB Score total  83     Rest  0     Walk  4     Stairs  5     Bath  4     Dress  1     Shop  5       CAT Score   CAT Score  18       mMRC Score   mMRC Score  3        UCSD: Self-administered rating of dyspnea associated with activities of daily living (ADLs) 6-point scale (0 = "not at all" to 5 = "maximal or unable to do because of breathlessness")  Scoring Scores range from 0 to 120.  Minimally important difference is 5 units  CAT: CAT can identify the health impairment of COPD patients and is better correlated with disease progression.  CAT has a scoring range of zero to 40. The CAT score is classified into four groups of low (less than 10), medium (10 - 20), high (21-30) and very high (31-40) based on the impact level of disease on health status. A CAT score over 10 suggests significant symptoms.  A worsening CAT score could be explained by an exacerbation, poor medication adherence, poor inhaler technique, or progression of COPD or comorbid conditions.  CAT MCID is 2 points  mMRC: mMRC (Modified Medical Research Council) Dyspnea Scale is used to assess the degree of baseline functional disability in patients of  respiratory disease due to dyspnea. No minimal important difference is established. A decrease in score of 1 point or greater is considered a positive change.   Pulmonary Function Assessment:   Exercise Target Goals: Exercise Program Goal: Individual exercise prescription set using results  from initial 6 min walk test and THRR while considering  patient's activity barriers and safety.   Exercise Prescription Goal: Initial exercise prescription builds to 30-45 minutes a day of aerobic activity, 2-3 days per week.  Home exercise guidelines will be given to patient during program as part of exercise prescription that the participant will acknowledge.  Activity Barriers & Risk Stratification: Activity Barriers & Cardiac Risk Stratification - 04/01/19 1413      Activity Barriers & Cardiac Risk Stratification   Activity Barriers  Arthritis;Deconditioning;Muscular Weakness;Shortness of Breath;Fibromyalgia    Comments  chronic fatigue, neuropathy.    Cardiac Risk Stratification  High       6 Minute Walk: 6 Minute Walk    Row Name 04/06/19 1659         6 Minute Walk   Phase  Initial     Distance  475 feet     Walk Time  4 minutes     # of Rest Breaks  2     MPH  1.34     METS  1     RPE  14     Perceived Dyspnea   3     VO2 Peak  3.4     Symptoms  Yes (comment)     Comments  back and leg pain     Resting HR  99 bpm     Resting BP  154/98     Resting Oxygen Saturation   96 %     Exercise Oxygen Saturation  during 6 min walk  95 %     Max Ex. HR  145 bpm     Max Ex. BP  168/102       Oxygen Initial Assessment: Oxygen Initial Assessment - 04/01/19 1412      Home Oxygen   Home Oxygen Device  None    Sleep Oxygen Prescription  None    Home Exercise Oxygen Prescription  None    Home at Rest Exercise Oxygen Prescription  None      Initial 6 min Walk   Oxygen Used  None      Program Oxygen Prescription   Program Oxygen Prescription  None      Intervention   Short Term  Goals  To learn and understand importance of monitoring SPO2 with pulse oximeter and demonstrate accurate use of the pulse oximeter.;To learn and understand importance of maintaining oxygen saturations>88%;To learn and demonstrate proper pursed lip breathing techniques or other breathing techniques.    Long  Term Goals  Verbalizes importance of monitoring SPO2 with pulse oximeter and return demonstration;Maintenance of O2 saturations>88%;Exhibits proper breathing techniques, such as pursed lip breathing or other method taught during program session       Oxygen Re-Evaluation:   Oxygen Discharge (Final Oxygen Re-Evaluation):   Initial Exercise Prescription: Initial Exercise Prescription - 04/06/19 1700      Date of Initial Exercise RX and Referring Provider   Date  04/06/19    Referring Provider  Gollan      Treadmill   MPH  1    Grade  0    Minutes  15   rest as needed   METs  1      NuStep   Level  1    SPM  80    Minutes  15    METs  1      Arm Ergometer   Level  1    RPM  25    Minutes  15    METs  1      Biostep-RELP   Level  1    SPM  50    Minutes  15      Prescription Details   Frequency (times per week)  3    Duration  Progress to 30 minutes of continuous aerobic without signs/symptoms of physical distress      Intensity   THRR 40-80% of Max Heartrate  121-150    Ratings of Perceived Exertion  11-15    Perceived Dyspnea  0-4      Resistance Training   Training Prescription  Yes    Weight  3 lb    Reps  10-15       Perform Capillary Blood Glucose checks as needed.  Exercise Prescription Changes: Exercise Prescription Changes    Row Name 04/06/19 1700             Response to Exercise   Blood Pressure (Admit)  154/98       Blood Pressure (Exercise)  168/102       Blood Pressure (Exit)  132/84       Heart Rate (Admit)  99 bpm       Heart Rate (Exercise)  145 bpm       Heart Rate (Exit)  104 bpm       Oxygen Saturation (Admit)  96 %        Oxygen Saturation (Exercise)  95 %       Oxygen Saturation (Exit)  96 %       Rating of Perceived Exertion (Exercise)  14       Perceived Dyspnea (Exercise)  3       Symptoms  leg and back pain          Exercise Comments:   Exercise Goals and Review: Exercise Goals    Row Name 04/06/19 1715             Exercise Goals   Increase Physical Activity  Yes       Intervention  Provide advice, education, support and counseling about physical activity/exercise needs.;Develop an individualized exercise prescription for aerobic and resistive training based on initial evaluation findings, risk stratification, comorbidities and participant's personal goals.       Expected Outcomes  Short Term: Attend rehab on a regular basis to increase amount of physical activity.;Long Term: Add in home exercise to make exercise part of routine and to increase amount of physical activity.;Long Term: Exercising regularly at least 3-5 days a week.       Increase Strength and Stamina  Yes       Intervention  Provide advice, education, support and counseling about physical activity/exercise needs.;Develop an individualized exercise prescription for aerobic and resistive training based on initial evaluation findings, risk stratification, comorbidities and participant's personal goals.       Expected Outcomes  Short Term: Increase workloads from initial exercise prescription for resistance, speed, and METs.;Short Term: Perform resistance training exercises routinely during rehab and add in resistance training at home;Long Term: Improve cardiorespiratory fitness, muscular endurance and strength as measured by increased METs and functional capacity (6MWT)       Able to understand and use rate of perceived exertion (RPE) scale  Yes       Expected Outcomes  Short Term: Able to use RPE daily in rehab to express subjective intensity level;Long Term:  Able to use RPE to guide intensity level when exercising independently  Able to understand and use Dyspnea scale  Yes       Intervention  Provide education and explanation on how to use Dyspnea scale       Expected Outcomes  Short Term: Able to use Dyspnea scale daily in rehab to express subjective sense of shortness of breath during exertion;Long Term: Able to use Dyspnea scale to guide intensity level when exercising independently       Knowledge and understanding of Target Heart Rate Range (THRR)  Yes       Intervention  Provide education and explanation of THRR including how the numbers were predicted and where they are located for reference       Expected Outcomes  Short Term: Able to state/look up THRR;Short Term: Able to use daily as guideline for intensity in rehab;Long Term: Able to use THRR to govern intensity when exercising independently       Able to check pulse independently  Yes       Intervention  Provide education and demonstration on how to check pulse in carotid and radial arteries.;Review the importance of being able to check your own pulse for safety during independent exercise       Expected Outcomes  Short Term: Able to explain why pulse checking is important during independent exercise;Long Term: Able to check pulse independently and accurately       Understanding of Exercise Prescription  Yes       Intervention  Provide education, explanation, and written materials on patient's individual exercise prescription       Expected Outcomes  Short Term: Able to explain program exercise prescription;Long Term: Able to explain home exercise prescription to exercise independently          Exercise Goals Re-Evaluation :   Discharge Exercise Prescription (Final Exercise Prescription Changes): Exercise Prescription Changes - 04/06/19 1700      Response to Exercise   Blood Pressure (Admit)  154/98    Blood Pressure (Exercise)  168/102    Blood Pressure (Exit)  132/84    Heart Rate (Admit)  99 bpm    Heart Rate (Exercise)  145 bpm    Heart Rate  (Exit)  104 bpm    Oxygen Saturation (Admit)  96 %    Oxygen Saturation (Exercise)  95 %    Oxygen Saturation (Exit)  96 %    Rating of Perceived Exertion (Exercise)  14    Perceived Dyspnea (Exercise)  3    Symptoms  leg and back pain       Nutrition:  Target Goals: Understanding of nutrition guidelines, daily intake of sodium <1579m, cholesterol <2064m calories 30% from fat and 7% or less from saturated fats, daily to have 5 or more servings of fruits and vegetables.  Biometrics:    Nutrition Therapy Plan and Nutrition Goals:   Nutrition Assessments:   Nutrition Goals Re-Evaluation:   Nutrition Goals Discharge (Final Nutrition Goals Re-Evaluation):   Psychosocial: Target Goals: Acknowledge presence or absence of significant depression and/or stress, maximize coping skills, provide positive support system. Participant is able to verbalize types and ability to use techniques and skills needed for reducing stress and depression.   Initial Review & Psychosocial Screening: Initial Psych Review & Screening - 04/01/19 1417      Initial Review   Current issues with  Current Anxiety/Panic;Current Depression;Current Psychotropic Meds;Current Stress Concerns    Source of Stress Concerns  Chronic Illness;Poor Coping Skills;Family;Unable to participate in former interests or hobbies    Comments  Life is a major stressor.  Learning to cope with it best she can.  Son has autism and has a lot to manage. COVID.  Falling asleep just sitting there or even working at home during meetings. Has feelings of overwhelming fatigue and napping for 2-3 hours.  She has already had a couple of naps today. Trazadone is helping to sleep straight but makes it hard to get up.  Last night was a rough night.      Family Dynamics   Good Support System?  Yes   Husband (caretaker), daughter helps out (single mom), youngest son lives with them.   Comments  Middle son has autism.      Barriers    Psychosocial barriers to participate in program  The patient should benefit from training in stress management and relaxation.;Psychosocial barriers identified (see note)      Screening Interventions   Interventions  Encouraged to exercise;Provide feedback about the scores to participant;To provide support and resources with identified psychosocial needs    Expected Outcomes  Short Term goal: Utilizing psychosocial counselor, staff and physician to assist with identification of specific Stressors or current issues interfering with healing process. Setting desired goal for each stressor or current issue identified.;Long Term Goal: Stressors or current issues are controlled or eliminated.;Short Term goal: Identification and review with participant of any Quality of Life or Depression concerns found by scoring the questionnaire.;Long Term goal: The participant improves quality of Life and PHQ9 Scores as seen by post scores and/or verbalization of changes       Quality of Life Scores:  Scores of 19 and below usually indicate a poorer quality of life in these areas.  A difference of  2-3 points is a clinically meaningful difference.  A difference of 2-3 points in the total score of the Quality of Life Index has been associated with significant improvement in overall quality of life, self-image, physical symptoms, and general health in studies assessing change in quality of life.  PHQ-9: Recent Review Flowsheet Data    Depression screen [facility] 2/9 04/06/2019 03/30/2019 03/17/2018   Decreased Interest '1 3 1   ' Down, Depressed, Hopeless '2 1 2   ' PHQ - 2 Score '3 4 3   ' Altered sleeping '3 3 3   ' Tired, decreased energy '3 3 3   ' Change in appetite '3 3 3   ' Feeling bad or failure about yourself  '3 3 3   ' Trouble concentrating '3 3 3   ' Moving slowly or fidgety/restless '1 1 1   ' Suicidal thoughts 0 0 0   PHQ-9 Score '19 20 19   ' Difficult doing work/chores Extremely dIfficult Extremely dIfficult Not difficult at all      Interpretation of Total Score  Total Score Depression Severity:  1-4 = Minimal depression, 5-9 = Mild depression, 10-14 = Moderate depression, 15-19 = Moderately severe depression, 20-27 = Severe depression   Psychosocial Evaluation and Intervention: Psychosocial Evaluation - 04/01/19 1427      Psychosocial Evaluation & Interventions   Interventions  Stress management education;Encouraged to exercise with the program and follow exercise prescription    Comments  Cadence is coming to rehab for heart failure and SOB.  She wants to get stronger and improve her mobility and breathing.  She struggles with her sleeping and causes her fall asleep during the day and at work.  She has currently quit her job as she was not functioning.  She wants to be able to go and do with her kids  and family.    Expected Outcomes  Short: Attend rehab regularly  Long: Improve overall stamina    Continue Psychosocial Services   Follow up required by staff       Psychosocial Re-Evaluation:   Psychosocial Discharge (Final Psychosocial Re-Evaluation):   Education: Education Goals: Education classes will be provided on a weekly basis, covering required topics. Participant will state understanding/return demonstration of topics presented.  Learning Barriers/Preferences: Learning Barriers/Preferences - 04/01/19 1414      Learning Barriers/Preferences   Learning Barriers  Sight   glasses   Learning Preferences  Skilled Demonstration;Video       Education Topics:  Initial Evaluation Education: - Verbal, written and demonstration of respiratory meds, oximetry and breathing techniques. Instruction on use of nebulizers and MDIs and importance of monitoring MDI activations.   General Nutrition Guidelines/Fats and Fiber: -Group instruction provided by verbal, written material, models and posters to present the general guidelines for heart healthy nutrition. Gives an explanation and review of dietary fats and  fiber.   Controlling Sodium/Reading Food Labels: -Group verbal and written material supporting the discussion of sodium use in heart healthy nutrition. Review and explanation with models, verbal and written materials for utilization of the food label.   Exercise Physiology & General Exercise Guidelines: - Group verbal and written instruction with models to review the exercise physiology of the cardiovascular system and associated critical values. Provides general exercise guidelines with specific guidelines to those with heart or lung disease.    Aerobic Exercise & Resistance Training: - Gives group verbal and written instruction on the various components of exercise. Focuses on aerobic and resistive training programs and the benefits of this training and how to safely progress through these programs.   Flexibility, Balance, Mind/Body Relaxation: Provides group verbal/written instruction on the benefits of flexibility and balance training, including mind/body exercise modes such as yoga, pilates and tai chi.  Demonstration and skill practice provided.   Stress and Anxiety: - Provides group verbal and written instruction about the health risks of elevated stress and causes of high stress.  Discuss the correlation between heart/lung disease and anxiety and treatment options. Review healthy ways to manage with stress and anxiety.   Depression: - Provides group verbal and written instruction on the correlation between heart/lung disease and depressed mood, treatment options, and the stigmas associated with seeking treatment.   Exercise & Equipment Safety: - Individual verbal instruction and demonstration of equipment use and safety with use of the equipment.   Pulmonary Rehab from 04/06/2019 in [facility] Cardiac and Pulmonary Rehab  Date  04/06/19  Educator  AS  Instruction Review Code  1- Verbalizes Understanding      Infection Prevention: - Provides verbal and written material to  individual with discussion of infection control including proper hand washing and proper equipment cleaning during exercise session.   Pulmonary Rehab from 04/06/2019 in [facility] Cardiac and Pulmonary Rehab  Date  04/06/19  Educator  AS  Instruction Review Code  1- Verbalizes Understanding      Falls Prevention: - Provides verbal and written material to individual with discussion of falls prevention and safety.   Pulmonary Rehab from 04/06/2019 in [facility] Cardiac and Pulmonary Rehab  Date  04/06/19  Educator  AS  Instruction Review Code  1- Verbalizes Understanding      Diabetes: - Individual verbal and written instruction to review signs/symptoms of diabetes, desired ranges of glucose level fasting, after meals and with exercise. Advice that pre and post exercise glucose checks  will be done for 3 sessions at entry of program.   Chronic Lung Diseases: - Group verbal and written instruction to review updates, respiratory medications, advancements in procedures and treatments. Discuss use of supplemental oxygen including available portable oxygen systems, continuous and intermittent flow rates, concentrators, personal use and safety guidelines. Review proper use of inhaler and spacers. Provide informative websites for self-education.    Energy Conservation: - Provide group verbal and written instruction for methods to conserve energy, plan and organize activities. Instruct on pacing techniques, use of adaptive equipment and posture/positioning to relieve shortness of breath.   Triggers and Exacerbations: - Group verbal and written instruction to review types of environmental triggers and ways to prevent exacerbations. Discuss weather changes, air quality and the benefits of nasal washing. Review warning signs and symptoms to help prevent infections. Discuss techniques for effective airway clearance, coughing, and vibrations.   AED/CPR: - Group verbal and written instruction with the use of  models to demonstrate the basic use of the AED with the basic ABC's of resuscitation.   Anatomy and Physiology of the Lungs: - Group verbal and written instruction with the use of models to provide basic lung anatomy and physiology related to function, structure and complications of lung disease.   Anatomy & Physiology of the Heart: - Group verbal and written instruction and models provide basic cardiac anatomy and physiology, with the coronary electrical and arterial systems. Review of Valvular disease and Heart Failure   Cardiac Medications: - Group verbal and written instruction to review commonly prescribed medications for heart disease. Reviews the medication, class of the drug, and side effects.   Know Your Numbers and Risk Factors: -Group verbal and written instruction about important numbers in your health.  Discussion of what are risk factors and how they play a role in the disease process.  Review of Cholesterol, Blood Pressure, Diabetes, and BMI and the role they play in your overall health.   Sleep Hygiene: -Provides group verbal and written instruction about how sleep can affect your health.  Define sleep hygiene, discuss sleep cycles and impact of sleep habits. Review good sleep hygiene tips.    Other: -Provides group and verbal instruction on various topics (see comments)    Knowledge Questionnaire Score: Knowledge Questionnaire Score - 04/06/19 1711      Knowledge Questionnaire Score   Pre Score  16/18        Core Components/Risk Factors/Patient Goals at Admission: Personal Goals and Risk Factors at Admission - 04/06/19 1714      Core Components/Risk Factors/Patient Goals on Admission    Weight Management  Yes    Intervention  Weight Management/Obesity: Establish reasonable short term and long term weight goals.    Admit Weight  374 lb 12.8 oz (170 kg)    Goal Weight: Short Term  365 lb (165.6 kg)    Goal Weight: Long Term  355 lb (161 kg)    Expected  Outcomes  Short Term: Continue to assess and modify interventions until short term weight is achieved;Long Term: Adherence to nutrition and physical activity/exercise program aimed toward attainment of established weight goal;Weight Maintenance: Understanding of the daily nutrition guidelines, which includes 25-35% calories from fat, 7% or less cal from saturated fats, less than 242m cholesterol, less than 1.5gm of sodium, & 5 or more servings of fruits and vegetables daily;Weight Loss: Understanding of general recommendations for a balanced deficit meal plan, which promotes 1-2 lb weight loss per week and includes a negative energy balance  of [id number] kcal/d;Understanding recommendations for meals to include 15-35% energy as protein, 25-35% energy from fat, 35-60% energy from carbohydrates, less than 225m of dietary cholesterol, 20-35 gm of total fiber daily;Understanding of distribution of calorie intake throughout the day with the consumption of 4-5 meals/snacks;Weight Gain: Understanding of general recommendations for a high calorie, high protein meal plan that promotes weight gain by distributing calorie intake throughout the day with the consumption for 4-5 meals, snacks, and/or supplements       Core Components/Risk Factors/Patient Goals Review:    Core Components/Risk Factors/Patient Goals at Discharge (Final Review):    ITP Comments: ITP Comments    Row Name 04/01/19 1440           ITP Comments  Completed virtual orientation today.  [facility]  EP eval scheduled for 12/7 at 3pm.          Comments: initial ITP

## 2019-04-07 ENCOUNTER — Ambulatory Visit: Payer: BC Managed Care – PPO

## 2019-04-07 ENCOUNTER — Other Ambulatory Visit: Payer: Self-pay | Admitting: Primary Care

## 2019-04-07 DIAGNOSIS — M25512 Pain in left shoulder: Secondary | ICD-10-CM | POA: Diagnosis not present

## 2019-04-07 DIAGNOSIS — M79604 Pain in right leg: Secondary | ICD-10-CM | POA: Diagnosis not present

## 2019-04-07 DIAGNOSIS — M545 Low back pain: Secondary | ICD-10-CM | POA: Diagnosis not present

## 2019-04-07 DIAGNOSIS — M79605 Pain in left leg: Secondary | ICD-10-CM | POA: Diagnosis not present

## 2019-04-08 ENCOUNTER — Ambulatory Visit: Payer: BC Managed Care – PPO

## 2019-04-09 ENCOUNTER — Ambulatory Visit: Payer: BC Managed Care – PPO

## 2019-04-09 ENCOUNTER — Other Ambulatory Visit: Payer: Self-pay | Admitting: Internal Medicine

## 2019-04-09 ENCOUNTER — Ambulatory Visit (INDEPENDENT_AMBULATORY_CARE_PROVIDER_SITE_OTHER): Payer: BC Managed Care – PPO | Admitting: Psychology

## 2019-04-09 ENCOUNTER — Other Ambulatory Visit: Payer: Self-pay | Admitting: Primary Care

## 2019-04-09 DIAGNOSIS — F3289 Other specified depressive episodes: Secondary | ICD-10-CM

## 2019-04-09 DIAGNOSIS — E559 Vitamin D deficiency, unspecified: Secondary | ICD-10-CM

## 2019-04-10 ENCOUNTER — Telehealth: Payer: Self-pay | Admitting: Dietician

## 2019-04-10 NOTE — Telephone Encounter (Signed)
Called patient regarding her missed class on 04/09/19. Left message stating that she is welcome to come to class 3 on 04/16/19 as scheduled, and reschedule class 2 then; or reschedule both classes.

## 2019-04-14 ENCOUNTER — Ambulatory Visit: Payer: BC Managed Care – PPO

## 2019-04-14 ENCOUNTER — Telehealth: Payer: Self-pay

## 2019-04-14 NOTE — Telephone Encounter (Signed)
Brandy [name] called and would like to cancel her current appointments.  She is concerned about the number of people at the main entrance of hospital, valet,etc.  She would like to reconsider after the first of the year and will call back then

## 2019-04-15 ENCOUNTER — Ambulatory Visit: Payer: BC Managed Care – PPO

## 2019-04-15 ENCOUNTER — Other Ambulatory Visit: Payer: Self-pay | Admitting: Primary Care

## 2019-04-15 DIAGNOSIS — E119 Type 2 diabetes mellitus without complications: Secondary | ICD-10-CM

## 2019-04-16 ENCOUNTER — Telehealth: Payer: Self-pay | Admitting: *Deleted

## 2019-04-16 ENCOUNTER — Ambulatory Visit: Payer: BC Managed Care – PPO

## 2019-04-16 ENCOUNTER — Ambulatory Visit (INDEPENDENT_AMBULATORY_CARE_PROVIDER_SITE_OTHER): Payer: BC Managed Care – PPO | Admitting: Psychology

## 2019-04-16 DIAGNOSIS — F3289 Other specified depressive episodes: Secondary | ICD-10-CM | POA: Diagnosis not present

## 2019-04-16 NOTE — Telephone Encounter (Signed)
Received voice mail from patient that she was not able to come to class today. She wants to reschedule. Called and spoke with her. She reports that she has terrible sleeping habits and is not able to make a morning class. Brandy [name] wants to come to classes and reports she enjoyed Class 1. She has the continuous monitor [name]) and is curious about meals and blood sugars. She will try to come Mon Dec 21 for Class 3 and Jan 11 for Class 2. Instructed her to call for any questions.

## 2019-04-20 ENCOUNTER — Encounter: Payer: Self-pay | Admitting: Dietician

## 2019-04-20 ENCOUNTER — Encounter: Payer: BC Managed Care – PPO | Admitting: Dietician

## 2019-04-20 VITALS — BP 120/70 | Ht 67.0 in | Wt 378.1 lb

## 2019-04-20 DIAGNOSIS — R0600 Dyspnea, unspecified: Secondary | ICD-10-CM | POA: Diagnosis not present

## 2019-04-20 DIAGNOSIS — E1159 Type 2 diabetes mellitus with other circulatory complications: Secondary | ICD-10-CM | POA: Diagnosis not present

## 2019-04-20 DIAGNOSIS — F331 Major depressive disorder, recurrent, moderate: Secondary | ICD-10-CM | POA: Diagnosis not present

## 2019-04-20 DIAGNOSIS — F411 Generalized anxiety disorder: Secondary | ICD-10-CM | POA: Diagnosis not present

## 2019-04-20 DIAGNOSIS — Z713 Dietary counseling and surveillance: Secondary | ICD-10-CM | POA: Diagnosis not present

## 2019-04-20 DIAGNOSIS — F5105 Insomnia due to other mental disorder: Secondary | ICD-10-CM | POA: Diagnosis not present

## 2019-04-20 DIAGNOSIS — I509 Heart failure, unspecified: Secondary | ICD-10-CM | POA: Diagnosis not present

## 2019-04-20 DIAGNOSIS — I872 Venous insufficiency (chronic) (peripheral): Secondary | ICD-10-CM | POA: Diagnosis not present

## 2019-04-20 DIAGNOSIS — Z6841 Body Mass Index (BMI) 40.0 and over, adult: Secondary | ICD-10-CM | POA: Diagnosis not present

## 2019-04-20 DIAGNOSIS — E1165 Type 2 diabetes mellitus with hyperglycemia: Secondary | ICD-10-CM

## 2019-04-20 NOTE — Progress Notes (Signed)

## 2019-04-21 ENCOUNTER — Ambulatory Visit: Payer: BC Managed Care – PPO

## 2019-04-21 ENCOUNTER — Other Ambulatory Visit: Payer: Self-pay

## 2019-04-22 ENCOUNTER — Ambulatory Visit: Payer: BC Managed Care – PPO

## 2019-04-23 ENCOUNTER — Ambulatory Visit: Payer: BC Managed Care – PPO

## 2019-04-24 LAB — HM DIABETES EYE EXAM

## 2019-04-28 ENCOUNTER — Ambulatory Visit: Payer: BC Managed Care – PPO

## 2019-04-28 ENCOUNTER — Ambulatory Visit (INDEPENDENT_AMBULATORY_CARE_PROVIDER_SITE_OTHER): Payer: BC Managed Care – PPO | Admitting: Psychology

## 2019-04-28 DIAGNOSIS — F3289 Other specified depressive episodes: Secondary | ICD-10-CM | POA: Diagnosis not present

## 2019-04-29 ENCOUNTER — Ambulatory Visit: Payer: BC Managed Care – PPO

## 2019-04-29 ENCOUNTER — Encounter: Payer: Self-pay | Admitting: *Deleted

## 2019-04-29 DIAGNOSIS — I5032 Chronic diastolic (congestive) heart failure: Secondary | ICD-10-CM

## 2019-04-29 DIAGNOSIS — R0600 Dyspnea, unspecified: Secondary | ICD-10-CM

## 2019-04-29 DIAGNOSIS — R0609 Other forms of dyspnea: Secondary | ICD-10-CM

## 2019-04-29 NOTE — Progress Notes (Signed)
Pulmonary Individual Treatment Plan  Patient Details  Name: Brandy [name] MRN: [id number] Date of Birth: [birth year] Referring Provider:     Pulmonary Rehab from 04/06/2019 in [facility] Cardiac and Pulmonary Rehab  Referring Provider  Gollan      Initial Encounter Date:    Pulmonary Rehab from 04/06/2019 in [facility] Cardiac and Pulmonary Rehab  Date  04/06/19      Visit Diagnosis: Dyspnea on exertion  Heart failure, diastolic, chronic ([place])  Patient's Home Medications on Admission:  Current Outpatient Medications:  .  ARIPiprazole (ABILIFY) 5 MG tablet, Take 5 mg by mouth daily. Taking 1/2 tab daily, Disp: , Rfl:  .  aspirin EC 81 MG tablet, Take 81 mg by mouth daily., Disp: , Rfl:  .  atorvastatin (LIPITOR) 10 MG tablet, TAKE 1 TABLET (10 MG TOTAL) BY MOUTH DAILY. FOR CHOLESTEROL. (Patient not taking: Reported on 03/30/2019), Disp: 90 tablet, Rfl: 0 .  busPIRone (BUSPAR) 10 MG tablet, TAKE 1 TABLET (10 MG TOTAL) BY MOUTH 2 (TWO) TIMES DAILY. FOR ANXIETY. (Patient not taking: Reported on 03/30/2019), Disp: 180 tablet, Rfl: 1 .  celecoxib (CELEBREX) 100 MG capsule, Take 1 capsule (100 mg total) by mouth 2 (two) times daily. As needed for pain., Disp: 180 capsule, Rfl: 1 .  cetirizine (ZYRTEC) 10 MG tablet, TAKE 1 TABLET BY MOUTH EVERY DAY, Disp: 90 tablet, Rfl: 0 .  cholecalciferol (VITAMIN D3) 25 MCG (1000 UT) tablet, Take 1,000 Units by mouth 2 (two) times daily., Disp: , Rfl:  .  Continuous Blood Gluc Receiver (DEXCOM G6 RECEIVER) DEVI, Inject 1 Device into the skin daily. (Patient not taking: Reported on 03/30/2019), Disp: 4 each, Rfl: 1 .  Continuous Blood Gluc Sensor (DEXCOM G6 SENSOR) MISC, Inject 1 Device into the skin daily. (Patient not taking: Reported on 03/30/2019), Disp: 3 each, Rfl: 1 .  Continuous Blood Gluc Transmit (DEXCOM G6 TRANSMITTER) MISC, Inject 1 Device into the skin daily. (Patient not taking: Reported on 03/30/2019), Disp: 4 each, Rfl: 1 .  Cyanocobalamin (VITAMIN B-12  PO), Take 1 tablet by mouth daily., Disp: , Rfl:  .  DULoxetine (CYMBALTA) 30 MG capsule, Take 30 mg by mouth daily. , Disp: , Rfl:  .  DULoxetine (CYMBALTA) 60 MG capsule, Take 60 mg by mouth daily., Disp: , Rfl:  .  folic acid (FOLVITE) 1 MG tablet, Take 1 mg by mouth daily., Disp: , Rfl:  .  gabapentin (NEURONTIN) 300 MG capsule, Take 300 mg by mouth 3 (three) times daily., Disp: , Rfl:  .  glipiZIDE (GLUCOTROL) 10 MG tablet, TAKE 1 TABLET (10 MG TOTAL) BY MOUTH 2 (TWO) TIMES DAILY BEFORE A MEAL. FOR DIABETES., Disp: 180 tablet, Rfl: 3 .  hydrocortisone 2.5 % cream, Apply topically 2 (two) times daily., Disp: , Rfl:  .  Insulin Glargine (BASAGLAR KWIKPEN) 100 UNIT/ML SOPN, Inject 0.35 mLs (35 Units total) into the skin at bedtime., Disp: 15 mL, Rfl: 2 .  Insulin Pen Needle (PEN NEEDLES) 31G X 6 MM MISC, Use nightly with insulin., Disp: 100 each, Rfl: 2 .  lisinopril-hydrochlorothiazide (ZESTORETIC) 20-12.5 MG tablet, TAKE 1 TABLET BY MOUTH DAILY FOR BLOOD PRESSURE, Disp: 90 tablet, Rfl: 1 .  metFORMIN (GLUCOPHAGE-XR) 500 MG 24 hr tablet, TAKE 2 TABLETS (1,000 MG TOTAL) BY MOUTH DAILY WITH BREAKFAST. FOR DIABETES., Disp: 180 tablet, Rfl: 1 .  metroNIDAZOLE (METROCREAM) 0.75 % cream, Apply 1 application topically daily., Disp: , Rfl:  .  Multiple Minerals-Vitamins (CALCIUM-MAGNESIUM-ZINC-D3 PO), Take 1 tablet by mouth  daily., Disp: , Rfl:  .  traZODone (DESYREL) 50 MG tablet, Take 50 mg by mouth at bedtime as needed. Taking 1/2 tab daily, Disp: , Rfl:  .  Vitamin D, Ergocalciferol, (DRISDOL) 1.25 MG (50000 UT) CAPS capsule, TAKE 1 CAPSULE (50,000 UNITS TOTAL) BY MOUTH EVERY 7 (SEVEN) DAYS., Disp: 12 capsule, Rfl: 1  Past Medical History: Past Medical History:  Diagnosis Date  . Anxiety   . Depression   . Diabetes mellitus without complication ([place])   . Dysmetabolic syndrome X   . Hypertension   . Hypertriglyceridemia   . Obesity   . Tetanus vaccine causing adverse effect in therapeutic use      Tobacco Use: Social History   Tobacco Use  Smoking Status Never Smoker  Smokeless Tobacco Never Used    Labs: Recent Review Flowsheet Data    Labs for ITP Cardiac and Pulmonary Rehab Latest Ref Rng & Units 07/15/2018 07/15/2018 08/29/2018 12/10/2018 03/18/2019   Cholestrol 0 - 200 mg/dL - - - - 160   LDLCALC 0 - 99 mg/dL - - - - 71   LDLDIRECT mg/dL - - - - -   HDL >39.00 mg/dL - - - - 56.70   Trlycerides 0.0 - 149.0 mg/dL - - - - 159.0(H)   Hemoglobin A1c 4.0 - 5.6 % - - 8.5(H) 8.9(A) 9.1(A)   PHART 7.350 - 7.450 7.44 - - - -   PCO2ART 32.0 - 48.0 mmHg 33 - - - -   HCO3 20.0 - 28.0 mmol/L 22.4 - - - -   ACIDBASEDEF 0.0 - 2.0 mmol/L 1.1 - - - -   O2SAT % 97.9 97.9 - - -       Pulmonary Assessment Scores: Pulmonary Assessment Scores    Row Name 04/06/19 1704         ADL UCSD   SOB Score total  83     Rest  0     Walk  4     Stairs  5     Bath  4     Dress  1     Shop  5       CAT Score   CAT Score  18       mMRC Score   mMRC Score  3        UCSD: Self-administered rating of dyspnea associated with activities of daily living (ADLs) 6-point scale (0 = "not at all" to 5 = "maximal or unable to do because of breathlessness")  Scoring Scores range from 0 to 120.  Minimally important difference is 5 units  CAT: CAT can identify the health impairment of COPD patients and is better correlated with disease progression.  CAT has a scoring range of zero to 40. The CAT score is classified into four groups of low (less than 10), medium (10 - 20), high (21-30) and very high (31-40) based on the impact level of disease on health status. A CAT score over 10 suggests significant symptoms.  A worsening CAT score could be explained by an exacerbation, poor medication adherence, poor inhaler technique, or progression of COPD or comorbid conditions.  CAT MCID is 2 points  mMRC: mMRC (Modified Medical Research Council) Dyspnea Scale is used to assess the degree of baseline  functional disability in patients of respiratory disease due to dyspnea. No minimal important difference is established. A decrease in score of 1 point or greater is considered a positive change.   Pulmonary Function Assessment:   Exercise  Target Goals: Exercise Program Goal: Individual exercise prescription set using results from initial 6 min walk test and THRR while considering  patient's activity barriers and safety.   Exercise Prescription Goal: Initial exercise prescription builds to 30-45 minutes a day of aerobic activity, 2-3 days per week.  Home exercise guidelines will be given to patient during program as part of exercise prescription that the participant will acknowledge.  Activity Barriers & Risk Stratification: Activity Barriers & Cardiac Risk Stratification - 04/01/19 1413      Activity Barriers & Cardiac Risk Stratification   Activity Barriers  Arthritis;Deconditioning;Muscular Weakness;Shortness of Breath;Fibromyalgia    Comments  chronic fatigue, neuropathy.    Cardiac Risk Stratification  High       6 Minute Walk: 6 Minute Walk    Row Name 04/06/19 1659         6 Minute Walk   Phase  Initial     Distance  475 feet     Walk Time  4 minutes     # of Rest Breaks  2     MPH  1.34     METS  1     RPE  14     Perceived Dyspnea   3     VO2 Peak  3.4     Symptoms  Yes (comment)     Comments  back and leg pain     Resting HR  99 bpm     Resting BP  154/98     Resting Oxygen Saturation   96 %     Exercise Oxygen Saturation  during 6 min walk  95 %     Max Ex. HR  145 bpm     Max Ex. BP  168/102       Oxygen Initial Assessment: Oxygen Initial Assessment - 04/01/19 1412      Home Oxygen   Home Oxygen Device  None    Sleep Oxygen Prescription  None    Home Exercise Oxygen Prescription  None    Home at Rest Exercise Oxygen Prescription  None      Initial 6 min Walk   Oxygen Used  None      Program Oxygen Prescription   Program Oxygen Prescription   None      Intervention   Short Term Goals  To learn and understand importance of monitoring SPO2 with pulse oximeter and demonstrate accurate use of the pulse oximeter.;To learn and understand importance of maintaining oxygen saturations>88%;To learn and demonstrate proper pursed lip breathing techniques or other breathing techniques.    Long  Term Goals  Verbalizes importance of monitoring SPO2 with pulse oximeter and return demonstration;Maintenance of O2 saturations>88%;Exhibits proper breathing techniques, such as pursed lip breathing or other method taught during program session       Oxygen Re-Evaluation:   Oxygen Discharge (Final Oxygen Re-Evaluation):   Initial Exercise Prescription: Initial Exercise Prescription - 04/06/19 1700      Date of Initial Exercise RX and Referring Provider   Date  04/06/19    Referring Provider  Gollan      Treadmill   MPH  1    Grade  0    Minutes  15   rest as needed   METs  1      NuStep   Level  1    SPM  80    Minutes  15    METs  1      Arm Ergometer   Level  1    RPM  25    Minutes  15    METs  1      Biostep-RELP   Level  1    SPM  50    Minutes  15      Prescription Details   Frequency (times per week)  3    Duration  Progress to 30 minutes of continuous aerobic without signs/symptoms of physical distress      Intensity   THRR 40-80% of Max Heartrate  121-150    Ratings of Perceived Exertion  11-15    Perceived Dyspnea  0-4      Resistance Training   Training Prescription  Yes    Weight  3 lb    Reps  10-15       Perform Capillary Blood Glucose checks as needed.  Exercise Prescription Changes: Exercise Prescription Changes    Row Name 04/06/19 1700             Response to Exercise   Blood Pressure (Admit)  154/98       Blood Pressure (Exercise)  168/102       Blood Pressure (Exit)  132/84       Heart Rate (Admit)  99 bpm       Heart Rate (Exercise)  145 bpm       Heart Rate (Exit)  104 bpm        Oxygen Saturation (Admit)  96 %       Oxygen Saturation (Exercise)  95 %       Oxygen Saturation (Exit)  96 %       Rating of Perceived Exertion (Exercise)  14       Perceived Dyspnea (Exercise)  3       Symptoms  leg and back pain          Exercise Comments:   Exercise Goals and Review: Exercise Goals    Row Name 04/06/19 1715             Exercise Goals   Increase Physical Activity  Yes       Intervention  Provide advice, education, support and counseling about physical activity/exercise needs.;Develop an individualized exercise prescription for aerobic and resistive training based on initial evaluation findings, risk stratification, comorbidities and participant's personal goals.       Expected Outcomes  Short Term: Attend rehab on a regular basis to increase amount of physical activity.;Long Term: Add in home exercise to make exercise part of routine and to increase amount of physical activity.;Long Term: Exercising regularly at least 3-5 days a week.       Increase Strength and Stamina  Yes       Intervention  Provide advice, education, support and counseling about physical activity/exercise needs.;Develop an individualized exercise prescription for aerobic and resistive training based on initial evaluation findings, risk stratification, comorbidities and participant's personal goals.       Expected Outcomes  Short Term: Increase workloads from initial exercise prescription for resistance, speed, and METs.;Short Term: Perform resistance training exercises routinely during rehab and add in resistance training at home;Long Term: Improve cardiorespiratory fitness, muscular endurance and strength as measured by increased METs and functional capacity (6MWT)       Able to understand and use rate of perceived exertion (RPE) scale  Yes       Expected Outcomes  Short Term: Able to use RPE daily in rehab to express subjective intensity level;Long Term:  Able to use RPE  to guide intensity  level when exercising independently       Able to understand and use Dyspnea scale  Yes       Intervention  Provide education and explanation on how to use Dyspnea scale       Expected Outcomes  Short Term: Able to use Dyspnea scale daily in rehab to express subjective sense of shortness of breath during exertion;Long Term: Able to use Dyspnea scale to guide intensity level when exercising independently       Knowledge and understanding of Target Heart Rate Range (THRR)  Yes       Intervention  Provide education and explanation of THRR including how the numbers were predicted and where they are located for reference       Expected Outcomes  Short Term: Able to state/look up THRR;Short Term: Able to use daily as guideline for intensity in rehab;Long Term: Able to use THRR to govern intensity when exercising independently       Able to check pulse independently  Yes       Intervention  Provide education and demonstration on how to check pulse in carotid and radial arteries.;Review the importance of being able to check your own pulse for safety during independent exercise       Expected Outcomes  Short Term: Able to explain why pulse checking is important during independent exercise;Long Term: Able to check pulse independently and accurately       Understanding of Exercise Prescription  Yes       Intervention  Provide education, explanation, and written materials on patient's individual exercise prescription       Expected Outcomes  Short Term: Able to explain program exercise prescription;Long Term: Able to explain home exercise prescription to exercise independently          Exercise Goals Re-Evaluation : Exercise Goals Re-Evaluation    Row Name 04/22/19 1247             Exercise Goal Re-Evaluation   Comments  Not starting until January          Discharge Exercise Prescription (Final Exercise Prescription Changes): Exercise Prescription Changes - 04/06/19 1700      Response to Exercise    Blood Pressure (Admit)  154/98    Blood Pressure (Exercise)  168/102    Blood Pressure (Exit)  132/84    Heart Rate (Admit)  99 bpm    Heart Rate (Exercise)  145 bpm    Heart Rate (Exit)  104 bpm    Oxygen Saturation (Admit)  96 %    Oxygen Saturation (Exercise)  95 %    Oxygen Saturation (Exit)  96 %    Rating of Perceived Exertion (Exercise)  14    Perceived Dyspnea (Exercise)  3    Symptoms  leg and back pain       Nutrition:  Target Goals: Understanding of nutrition guidelines, daily intake of sodium <151m, cholesterol <2010m calories 30% from fat and 7% or less from saturated fats, daily to have 5 or more servings of fruits and vegetables.  Biometrics: Pre Biometrics - 04/06/19 1717      Pre Biometrics   Height  _0  (1.702 m)    Weight  (!) 374 lb 12.8 oz (170 kg)    BMI (Calculated)  58.69    Single Leg Stand  1.5 seconds        Nutrition Therapy Plan and Nutrition Goals:   Nutrition Assessments:   Nutrition Goals Re-Evaluation:  Nutrition Goals Discharge (Final Nutrition Goals Re-Evaluation):   Psychosocial: Target Goals: Acknowledge presence or absence of significant depression and/or stress, maximize coping skills, provide positive support system. Participant is able to verbalize types and ability to use techniques and skills needed for reducing stress and depression.   Initial Review & Psychosocial Screening: Initial Psych Review & Screening - 04/01/19 1417      Initial Review   Current issues with  Current Anxiety/Panic;Current Depression;Current Psychotropic Meds;Current Stress Concerns    Source of Stress Concerns  Chronic Illness;Poor Coping Skills;Family;Unable to participate in former interests or hobbies    Comments  Life is a major stressor.  Learning to cope with it best she can.  Son has autism and has a lot to manage. COVID.  Falling asleep just sitting there or even working at home during meetings. Has feelings of overwhelming fatigue  and napping for 2-3 hours.  She has already had a couple of naps today. Trazadone is helping to sleep straight but makes it hard to get up.  Last night was a rough night.      Family Dynamics   Good Support System?  Yes   Husband (caretaker), daughter helps out (single mom), youngest son lives with them.   Comments  Middle son has autism.      Barriers   Psychosocial barriers to participate in program  The patient should benefit from training in stress management and relaxation.;Psychosocial barriers identified (see note)      Screening Interventions   Interventions  Encouraged to exercise;Provide feedback about the scores to participant;To provide support and resources with identified psychosocial needs    Expected Outcomes  Short Term goal: Utilizing psychosocial counselor, staff and physician to assist with identification of specific Stressors or current issues interfering with healing process. Setting desired goal for each stressor or current issue identified.;Long Term Goal: Stressors or current issues are controlled or eliminated.;Short Term goal: Identification and review with participant of any Quality of Life or Depression concerns found by scoring the questionnaire.;Long Term goal: The participant improves quality of Life and PHQ9 Scores as seen by post scores and/or verbalization of changes       Quality of Life Scores:  Scores of 19 and below usually indicate a poorer quality of life in these areas.  A difference of  2-3 points is a clinically meaningful difference.  A difference of 2-3 points in the total score of the Quality of Life Index has been associated with significant improvement in overall quality of life, self-image, physical symptoms, and general health in studies assessing change in quality of life.  PHQ-9: Recent Review Flowsheet Data    Depression screen [facility] 2/9 04/06/2019 03/30/2019 03/17/2018   Decreased Interest _0 Down, Depressed, Hopeless _1 PHQ - 2  Score _2 Altered sleeping _3 Tired, decreased energy _4 Change in appetite _5 Feeling bad or failure about yourself  _6 Trouble concentrating _7 Moving slowly or fidgety/restless _8 Suicidal thoughts 0 0 0   PHQ-9 Score _9 Difficult doing work/chores Extremely dIfficult Extremely dIfficult Not difficult at all     Interpretation of Total Score  Total Score Depression Severity:  1-4 = Minimal depression, 5-9 = Mild depression, 10-14 = Moderate depression, 15-19 = Moderately severe depression, 20-27 = Severe  depression   Psychosocial Evaluation and Intervention: Psychosocial Evaluation - 04/01/19 1427      Psychosocial Evaluation & Interventions   Interventions  Stress management education;Encouraged to exercise with the program and follow exercise prescription    Comments  Poetry is coming to rehab for heart failure and SOB.  She wants to get stronger and improve her mobility and breathing.  She struggles with her sleeping and causes her fall asleep during the day and at work.  She has currently quit her job as she was not functioning.  She wants to be able to go and do with her kids and family.    Expected Outcomes  Short: Attend rehab regularly  Long: Improve overall stamina    Continue Psychosocial Services   Follow up required by staff       Psychosocial Re-Evaluation:   Psychosocial Discharge (Final Psychosocial Re-Evaluation):   Education: Education Goals: Education classes will be provided on a weekly basis, covering required topics. Participant will state understanding/return demonstration of topics presented.  Learning Barriers/Preferences: Learning Barriers/Preferences - 04/01/19 1414      Learning Barriers/Preferences   Learning Barriers  Sight   glasses   Learning Preferences  Skilled Demonstration;Video       Education Topics:  Initial Evaluation Education: - Verbal, written and demonstration of respiratory meds,  oximetry and breathing techniques. Instruction on use of nebulizers and MDIs and importance of monitoring MDI activations.   General Nutrition Guidelines/Fats and Fiber: -Group instruction provided by verbal, written material, models and posters to present the general guidelines for heart healthy nutrition. Gives an explanation and review of dietary fats and fiber.   Controlling Sodium/Reading Food Labels: -Group verbal and written material supporting the discussion of sodium use in heart healthy nutrition. Review and explanation with models, verbal and written materials for utilization of the food label.   Exercise Physiology & General Exercise Guidelines: - Group verbal and written instruction with models to review the exercise physiology of the cardiovascular system and associated critical values. Provides general exercise guidelines with specific guidelines to those with heart or lung disease.    Aerobic Exercise & Resistance Training: - Gives group verbal and written instruction on the various components of exercise. Focuses on aerobic and resistive training programs and the benefits of this training and how to safely progress through these programs.   Flexibility, Balance, Mind/Body Relaxation: Provides group verbal/written instruction on the benefits of flexibility and balance training, including mind/body exercise modes such as yoga, pilates and tai chi.  Demonstration and skill practice provided.   Stress and Anxiety: - Provides group verbal and written instruction about the health risks of elevated stress and causes of high stress.  Discuss the correlation between heart/lung disease and anxiety and treatment options. Review healthy ways to manage with stress and anxiety.   Depression: - Provides group verbal and written instruction on the correlation between heart/lung disease and depressed mood, treatment options, and the stigmas associated with seeking treatment.   Exercise  & Equipment Safety: - Individual verbal instruction and demonstration of equipment use and safety with use of the equipment.   Pulmonary Rehab from 04/06/2019 in [facility] Cardiac and Pulmonary Rehab  Date  04/06/19  Educator  AS  Instruction Review Code  1- Verbalizes Understanding      Infection Prevention: - Provides verbal and written material to individual with discussion of infection control including proper hand washing and proper equipment cleaning during exercise session.   Pulmonary Rehab from 04/06/2019 in [facility]  Cardiac and Pulmonary Rehab  Date  04/06/19  Educator  AS  Instruction Review Code  1- Verbalizes Understanding      Falls Prevention: - Provides verbal and written material to individual with discussion of falls prevention and safety.   Pulmonary Rehab from 04/06/2019 in [facility] Cardiac and Pulmonary Rehab  Date  04/06/19  Educator  AS  Instruction Review Code  1- Verbalizes Understanding      Diabetes: - Individual verbal and written instruction to review signs/symptoms of diabetes, desired ranges of glucose level fasting, after meals and with exercise. Advice that pre and post exercise glucose checks will be done for 3 sessions at entry of program.   Chronic Lung Diseases: - Group verbal and written instruction to review updates, respiratory medications, advancements in procedures and treatments. Discuss use of supplemental oxygen including available portable oxygen systems, continuous and intermittent flow rates, concentrators, personal use and safety guidelines. Review proper use of inhaler and spacers. Provide informative websites for self-education.    Energy Conservation: - Provide group verbal and written instruction for methods to conserve energy, plan and organize activities. Instruct on pacing techniques, use of adaptive equipment and posture/positioning to relieve shortness of breath.   Triggers and Exacerbations: - Group verbal and written instruction to  review types of environmental triggers and ways to prevent exacerbations. Discuss weather changes, air quality and the benefits of nasal washing. Review warning signs and symptoms to help prevent infections. Discuss techniques for effective airway clearance, coughing, and vibrations.   AED/CPR: - Group verbal and written instruction with the use of models to demonstrate the basic use of the AED with the basic ABC's of resuscitation.   Anatomy and Physiology of the Lungs: - Group verbal and written instruction with the use of models to provide basic lung anatomy and physiology related to function, structure and complications of lung disease.   Anatomy & Physiology of the Heart: - Group verbal and written instruction and models provide basic cardiac anatomy and physiology, with the coronary electrical and arterial systems. Review of Valvular disease and Heart Failure   Cardiac Medications: - Group verbal and written instruction to review commonly prescribed medications for heart disease. Reviews the medication, class of the drug, and side effects.   Know Your Numbers and Risk Factors: -Group verbal and written instruction about important numbers in your health.  Discussion of what are risk factors and how they play a role in the disease process.  Review of Cholesterol, Blood Pressure, Diabetes, and BMI and the role they play in your overall health.   Sleep Hygiene: -Provides group verbal and written instruction about how sleep can affect your health.  Define sleep hygiene, discuss sleep cycles and impact of sleep habits. Review good sleep hygiene tips.    Other: -Provides group and verbal instruction on various topics (see comments)    Knowledge Questionnaire Score: Knowledge Questionnaire Score - 04/06/19 1711      Knowledge Questionnaire Score   Pre Score  16/18        Core Components/Risk Factors/Patient Goals at Admission: Personal Goals and Risk Factors at Admission -  04/06/19 1714      Core Components/Risk Factors/Patient Goals on Admission    Weight Management  Yes    Intervention  Weight Management/Obesity: Establish reasonable short term and long term weight goals.    Admit Weight  374 lb 12.8 oz (170 kg)    Goal Weight: Short Term  365 lb (165.6 kg)    Goal Weight:  Long Term  355 lb (161 kg)    Expected Outcomes  Short Term: Continue to assess and modify interventions until short term weight is achieved;Long Term: Adherence to nutrition and physical activity/exercise program aimed toward attainment of established weight goal;Weight Maintenance: Understanding of the daily nutrition guidelines, which includes 25-35% calories from fat, 7% or less cal from saturated fats, less than 270m cholesterol, less than 1.5gm of sodium, & 5 or more servings of fruits and vegetables daily;Weight Loss: Understanding of general recommendations for a balanced deficit meal plan, which promotes 1-2 lb weight loss per week and includes a negative energy balance of [phone] kcal/d;Understanding recommendations for meals to include 15-35% energy as protein, 25-35% energy from fat, 35-60% energy from carbohydrates, less than 2036mof dietary cholesterol, 20-35 gm of total fiber daily;Understanding of distribution of calorie intake throughout the day with the consumption of 4-5 meals/snacks;Weight Gain: Understanding of general recommendations for a high calorie, high protein meal plan that promotes weight gain by distributing calorie intake throughout the day with the consumption for 4-5 meals, snacks, and/or supplements       Core Components/Risk Factors/Patient Goals Review:    Core Components/Risk Factors/Patient Goals at Discharge (Final Review):    ITP Comments: ITP Comments    Row Name 04/01/19 1440 04/06/19 1718 04/14/19 1420 04/22/19 1246 04/29/19 0927   ITP Comments  Completed virtual orientation today.  [facility]   EP eval scheduled for 12/7 at 3pm.  Completed 6MWT.  Initital ITP created and sent to Dr MiDortha Schwalbealled to day and wants to wait until after the first of the year to reconsider the program.  She is concerned about the number of people at the main entrance to the hospital, valet, etc.  She felt very safe in the Rehab area.  LoRiahill be out until after the new year.  30 day review competed . ITP sent to Dr MaEmily Filbertor review, changes as needed and ITP approval signature      Comments:

## 2019-04-30 ENCOUNTER — Ambulatory Visit: Payer: BC Managed Care – PPO

## 2019-05-04 ENCOUNTER — Other Ambulatory Visit: Payer: Self-pay

## 2019-05-04 ENCOUNTER — Encounter: Payer: BC Managed Care – PPO | Attending: Cardiovascular Disease

## 2019-05-04 ENCOUNTER — Telehealth: Payer: Self-pay

## 2019-05-04 DIAGNOSIS — Z6841 Body Mass Index (BMI) 40.0 and over, adult: Secondary | ICD-10-CM | POA: Insufficient documentation

## 2019-05-04 DIAGNOSIS — E1159 Type 2 diabetes mellitus with other circulatory complications: Secondary | ICD-10-CM | POA: Insufficient documentation

## 2019-05-04 DIAGNOSIS — Z713 Dietary counseling and surveillance: Secondary | ICD-10-CM | POA: Insufficient documentation

## 2019-05-04 DIAGNOSIS — I872 Venous insufficiency (chronic) (peripheral): Secondary | ICD-10-CM | POA: Insufficient documentation

## 2019-05-04 DIAGNOSIS — R0600 Dyspnea, unspecified: Secondary | ICD-10-CM

## 2019-05-04 DIAGNOSIS — I509 Heart failure, unspecified: Secondary | ICD-10-CM | POA: Insufficient documentation

## 2019-05-04 DIAGNOSIS — R0609 Other forms of dyspnea: Secondary | ICD-10-CM

## 2019-05-04 NOTE — Progress Notes (Signed)
Completed Initial RD eval 

## 2019-05-04 NOTE — Telephone Encounter (Signed)
Speaks with Melissa in a few minutes - wants to talk to her Dr this week - wants to wait until February

## 2019-05-05 ENCOUNTER — Ambulatory Visit: Payer: BC Managed Care – PPO

## 2019-05-06 ENCOUNTER — Ambulatory Visit: Payer: BC Managed Care – PPO

## 2019-05-06 ENCOUNTER — Other Ambulatory Visit: Payer: Self-pay

## 2019-05-06 ENCOUNTER — Ambulatory Visit: Payer: BC Managed Care – PPO | Admitting: Primary Care

## 2019-05-06 ENCOUNTER — Encounter: Payer: BC Managed Care – PPO | Admitting: *Deleted

## 2019-05-06 DIAGNOSIS — R0600 Dyspnea, unspecified: Secondary | ICD-10-CM

## 2019-05-06 DIAGNOSIS — I5032 Chronic diastolic (congestive) heart failure: Secondary | ICD-10-CM

## 2019-05-06 DIAGNOSIS — R0609 Other forms of dyspnea: Secondary | ICD-10-CM

## 2019-05-06 NOTE — Progress Notes (Signed)
Completed virtual appt today.  Reviewed Virtual Home Exercise Guidelines and program.  Brandy [name] wants to do the fully virtual program while COVID-19 is still high as she is not comfortable coming into the hospital at this time.  Once things calm down, she would like to do the in person program. Enrolled in [employer].  Reviewed home exercise with pt today.  Pt plans to walk and use staff videos at home for exercise.  Reviewed THR, pulse, RPE, sign and symptoms, and when to call 911 or MD.  Also discussed weather considerations and indoor options.  Pt voiced understanding.   Starting out your exercise session should last for 10 to 15 minutes for 3 days a week.    Your progression for home exercise is:  Start at 10 min 3 days a week consistently. Then begin to add a minute each day until you can do 15 min 3 days a week.  Continue at this level for 1-2 weeks then begin to add a minute each day to work up to 30 min.  Another alternative will be to start with one round of videos (moderate intensity) and then add in the second round to reach your 30 min.  You can also combine the videos with your walking too.  Once you reach 30 min 3 times a week we can start to add in more days.  Resistance Training: Start with 6-8 exercises for 12 reps each or follow along with a video. Exercises can be found in packet that will be mailed to you.   Exercise at a comfortable pace, using your heart rate and rate of perceived exertion as guides for intensity.  Your target heart rate range is 121-150.

## 2019-05-07 ENCOUNTER — Ambulatory Visit (INDEPENDENT_AMBULATORY_CARE_PROVIDER_SITE_OTHER): Payer: BC Managed Care – PPO | Admitting: Psychology

## 2019-05-07 ENCOUNTER — Ambulatory Visit: Payer: BC Managed Care – PPO

## 2019-05-07 DIAGNOSIS — F5105 Insomnia due to other mental disorder: Secondary | ICD-10-CM | POA: Diagnosis not present

## 2019-05-07 DIAGNOSIS — F331 Major depressive disorder, recurrent, moderate: Secondary | ICD-10-CM | POA: Diagnosis not present

## 2019-05-07 DIAGNOSIS — F411 Generalized anxiety disorder: Secondary | ICD-10-CM | POA: Diagnosis not present

## 2019-05-07 DIAGNOSIS — F3289 Other specified depressive episodes: Secondary | ICD-10-CM

## 2019-05-11 ENCOUNTER — Other Ambulatory Visit: Payer: Self-pay

## 2019-05-12 ENCOUNTER — Ambulatory Visit: Payer: BC Managed Care – PPO

## 2019-05-12 ENCOUNTER — Telehealth: Payer: Self-pay | Admitting: *Deleted

## 2019-05-12 NOTE — Telephone Encounter (Signed)
Phone call to reschedule Diabetes Class 2 that patient cancelled last night. Her husband called and reported that patient wasn't feeling well. Left message that next class is Feb 8 and to call back if she wants to reschedule.

## 2019-05-13 ENCOUNTER — Ambulatory Visit (INDEPENDENT_AMBULATORY_CARE_PROVIDER_SITE_OTHER): Payer: BC Managed Care – PPO | Admitting: Primary Care

## 2019-05-13 ENCOUNTER — Other Ambulatory Visit: Payer: Self-pay

## 2019-05-13 ENCOUNTER — Ambulatory Visit: Payer: BC Managed Care – PPO

## 2019-05-13 DIAGNOSIS — R413 Other amnesia: Secondary | ICD-10-CM

## 2019-05-13 DIAGNOSIS — E538 Deficiency of other specified B group vitamins: Secondary | ICD-10-CM

## 2019-05-13 DIAGNOSIS — R29898 Other symptoms and signs involving the musculoskeletal system: Secondary | ICD-10-CM

## 2019-05-13 DIAGNOSIS — M2550 Pain in unspecified joint: Secondary | ICD-10-CM | POA: Diagnosis not present

## 2019-05-13 DIAGNOSIS — M79604 Pain in right leg: Secondary | ICD-10-CM | POA: Diagnosis not present

## 2019-05-13 DIAGNOSIS — E559 Vitamin D deficiency, unspecified: Secondary | ICD-10-CM

## 2019-05-13 DIAGNOSIS — I10 Essential (primary) hypertension: Secondary | ICD-10-CM

## 2019-05-13 DIAGNOSIS — M79605 Pain in left leg: Secondary | ICD-10-CM

## 2019-05-13 NOTE — Progress Notes (Signed)
Subjective:    Patient ID: Brandy [name], female    DOB: August 13, 1965, 54 y.o.   MRN: [id number]  HPI  Virtual Visit via Video Note  I connected with Brandy [name] on 05/13/19 at 11:20 AM EST by a video enabled telemedicine application and verified that I am speaking with the correct person using two identifiers.  Location: Patient: Home Provider: Office   I discussed the limitations of evaluation and management by telemedicine and the availability of in person appointments. The patient expressed understanding and agreed to proceed.  History of Present Illness:  Brandy [name] is a 54 year old female with a history of type 2 diabetes, anxiety, depression, hyperlipidemia, chronic pain, morbid obesity who presents today to discuss a few questions.  She is concerned about a potential diagnosis of multiple sclerosis due to bilateral lower extremity pain and sensitivity, difficulty with ambulation, inability to stand for longer than 60 seconds, continuous low vitamin D despite supplementation.   She was evaluated by neurosurgery services in March 2020 where it was recommended she see vascular services to rule out vascular cause for her lower extremity symptoms.  It was also recommended she undergo EMG testing, but was not pushed towards this as they suspected she would be unable to tolerate testing. She's been taking gabapentin and celebrex with some improvement, no changes overall in symptoms.  No new symptoms.  She continues to experience bilateral lower extremity pain (dull, achy, intermittently sharp), intermittent spasms, heel pain, weakness with inability to stand for longer than 60 seconds. She denies numbness.  She is managed on vitamin D 50,000 units weekly and also on extra supplemental vitamin D (unsure of how much she's taking).  She is interested to learn if there are any autoimmune disorders present.  Her mother had rheumatoid arthritis, the patient tested negative in fall  2019.  She's also noticed incomplete bladder emptying (started Summer 2020) with urinary urgency (starting fall 2020) and a few episodes of incontinence (last episode 2-3 weeks ago).  She was told by her physical therapist that she could participate in pelvic floor physical therapy, she does plan to do this once Covid cases decreased.  She still has her uterus.  She would also like to discuss chronic short-term memory difficulties.  She has noticed and also her family has told her numerous times that she has little to no recollection of short-term memory.  She can be told something and will forget seconds to minutes later.  She has no difficulty with long-term memory.  Symptoms have been chronic for over the last year.  Following with psychiatry for anxiety and depression, feels well managed overall.   Observations/Objective:  Alert and oriented. Appears well, not sickly. No distress. Speaking in complete sentences.  Assessment and Plan:  See problem based charting  Follow Up Instructions:  Call the main line to set up a lab only appointment.  You will be contacted regarding your CT.  Please let us know if you have not been contacted within two weeks.   Please update me with the amount of vitamin D that you are taking aside from the prescription strength vitamin D.  We will see you in February schedule.  It was a pleasure to see you today! Allie Bossier, NP-C    I discussed the assessment and treatment plan with the patient. The patient was provided an opportunity to ask questions and all were answered. The patient agreed with the plan and demonstrated an understanding of the  instructions.   The patient was advised to call back or seek an in-person evaluation if the symptoms worsen or if the condition fails to improve as anticipated.   Doreene Nest, NP    Review of Systems  Respiratory: Negative for shortness of breath.   Cardiovascular: Negative for chest pain.   Genitourinary:       Incomplete bladder emptying, urinary urgency, urinary incontinence  Musculoskeletal: Positive for arthralgias and back pain.  Neurological: Positive for weakness. Negative for numbness.  Psychiatric/Behavioral:       See HPI       Past Medical History:  Diagnosis Date  . Anxiety   . Depression   . Diabetes mellitus without complication (HCC)   . Dysmetabolic syndrome X   . Hypertension   . Hypertriglyceridemia   . Obesity   . Tetanus vaccine causing adverse effect in therapeutic use      Social History   Socioeconomic History  . Marital status: Married    Spouse name: Not on file  . Number of children: 3  . Years of education: Not on file  . Highest education level: Not on file  Occupational History  . Occupation: Government social research officer: [employer]  Tobacco Use  . Smoking status: Never Smoker  . Smokeless tobacco: Never Used  Substance and Sexual Activity  . Alcohol use: Yes    Comment: rarely - once a year  . Drug use: No  . Sexual activity: Not on file  Other Topics Concern  . Not on file  Social History Narrative   Best boy      Married; 3 kids (middle child-severely autistic)      No regular exercise   Social Determinants of Health   Financial Resource Strain:   . Difficulty of Paying Living Expenses: Not on file  Food Insecurity:   . Worried About Programme researcher, broadcasting/film/video in the Last Year: Not on file  . Ran Out of Food in the Last Year: Not on file  Transportation Needs:   . Lack of Transportation (Medical): Not on file  . Lack of Transportation (Non-Medical): Not on file  Physical Activity:   . Days of Exercise per Week: Not on file  . Minutes of Exercise per Session: Not on file  Stress:   . Feeling of Stress : Not on file  Social Connections:   . Frequency of Communication with Friends and Family: Not on file  . Frequency of Social Gatherings with Friends and Family: Not on file  . Attends Religious  Services: Not on file  . Active Member of Clubs or Organizations: Not on file  . Attends Banker Meetings: Not on file  . Marital Status: Not on file  Intimate Partner Violence:   . Fear of Current or Ex-Partner: Not on file  . Emotionally Abused: Not on file  . Physically Abused: Not on file  . Sexually Abused: Not on file    Past Surgical History:  Procedure Laterality Date  . 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 Rheum arthritis Mother   . COPD Mother   . Hypertension Father   . Heart disease Maternal Grandmother   . Stroke Maternal Grandmother   . Alcohol abuse Maternal Grandmother   . Alcohol abuse Maternal Grandfather   . Aneurysm Paternal Grandmother   . Autism Son  severe  . Diabetes Paternal Aunt     Allergies  Allergen Reactions  . Diphenhydramine Hcl     REACTION: minor rash  . Penicillins     REACTION: reaction as a child    Current Outpatient Medications on File Prior to Visit  Medication Sig Dispense Refill  . ARIPiprazole (ABILIFY) 5 MG tablet Take 5 mg by mouth daily. Taking 1/2 tab daily    . aspirin EC 81 MG tablet Take 81 mg by mouth daily.    Marland Kitchen atorvastatin (LIPITOR) 10 MG tablet TAKE 1 TABLET (10 MG TOTAL) BY MOUTH DAILY. FOR CHOLESTEROL. (Patient not taking: Reported on 03/30/2019) 90 tablet 0  . busPIRone (BUSPAR) 10 MG tablet TAKE 1 TABLET (10 MG TOTAL) BY MOUTH 2 (TWO) TIMES DAILY. FOR ANXIETY. (Patient not taking: Reported on 03/30/2019) 180 tablet 1  . celecoxib (CELEBREX) 100 MG capsule Take 1 capsule (100 mg total) by mouth 2 (two) times daily. As needed for pain. 180 capsule 1  . cetirizine (ZYRTEC) 10 MG tablet TAKE 1 TABLET BY MOUTH EVERY DAY 90 tablet 0  . cholecalciferol (VITAMIN D3) 25 MCG (1000 UT) tablet Take 1,000 Units by mouth 2 (two) times daily.    . Continuous Blood Gluc Receiver (DEXCOM G6 RECEIVER) DEVI Inject 1 Device into the skin daily. (Patient not  taking: Reported on 03/30/2019) 4 each 1  . Continuous Blood Gluc Sensor (DEXCOM G6 SENSOR) MISC Inject 1 Device into the skin daily. (Patient not taking: Reported on 03/30/2019) 3 each 1  . Continuous Blood Gluc Transmit (DEXCOM G6 TRANSMITTER) MISC Inject 1 Device into the skin daily. (Patient not taking: Reported on 03/30/2019) 4 each 1  . Cyanocobalamin (VITAMIN B-12 PO) Take 1 tablet by mouth daily.    . DULoxetine (CYMBALTA) 30 MG capsule Take 30 mg by mouth daily.     . DULoxetine (CYMBALTA) 60 MG capsule Take 60 mg by mouth daily.    . folic acid (FOLVITE) 1 MG tablet Take 1 mg by mouth daily.    Marland Kitchen gabapentin (NEURONTIN) 300 MG capsule Take 300 mg by mouth 3 (three) times daily.    Marland Kitchen glipiZIDE (GLUCOTROL) 10 MG tablet TAKE 1 TABLET (10 MG TOTAL) BY MOUTH 2 (TWO) TIMES DAILY BEFORE A MEAL. FOR DIABETES. 180 tablet 3  . hydrocortisone 2.5 % cream Apply topically 2 (two) times daily.    . Insulin Glargine (BASAGLAR KWIKPEN) 100 UNIT/ML SOPN Inject 0.35 mLs (35 Units total) into the skin at bedtime. 15 mL 2  . Insulin Pen Needle (PEN NEEDLES) 31G X 6 MM MISC Use nightly with insulin. 100 each 2  . lisinopril-hydrochlorothiazide (ZESTORETIC) 20-12.5 MG tablet TAKE 1 TABLET BY MOUTH DAILY FOR BLOOD PRESSURE 90 tablet 1  . metFORMIN (GLUCOPHAGE-XR) 500 MG 24 hr tablet TAKE 2 TABLETS (1,000 MG TOTAL) BY MOUTH DAILY WITH BREAKFAST. FOR DIABETES. 180 tablet 1  . metroNIDAZOLE (METROCREAM) 0.75 % cream Apply 1 application topically daily.    . Multiple Minerals-Vitamins (CALCIUM-MAGNESIUM-ZINC-D3 PO) Take 1 tablet by mouth daily.    . traZODone (DESYREL) 50 MG tablet Take 50 mg by mouth at bedtime as needed. Taking 1/2 tab daily    . Vitamin D, Ergocalciferol, (DRISDOL) 1.25 MG (50000 UT) CAPS capsule TAKE 1 CAPSULE (50,000 UNITS TOTAL) BY MOUTH EVERY 7 (SEVEN) DAYS. 12 capsule 1   No current facility-administered medications on file prior to visit.    LMP  (LMP Unknown)    Objective:    Physical Exam  Constitutional: She  is oriented to person, place, and time. She appears well-nourished.  Respiratory: Effort normal.  Neurological: She is alert and oriented to person, place, and time.  Psychiatric: She has a normal mood and affect.           Assessment & Plan:

## 2019-05-13 NOTE — Assessment & Plan Note (Addendum)
Vitamin B12 level from October 2020 slightly elevated.  No deficiency and likely not contributing to short-term memory problems.

## 2019-05-13 NOTE — Assessment & Plan Note (Signed)
Chronic and continued, no acute changes.  Vascular evaluation without cause. She does not wish to proceed with EMG testing given the potential discomfort during exam.  She does have weakness and cannot stand longer than 60 seconds.  She is working with PT.  Given continued weakness coupled with memory difficulties and low vitamin D we will obtain imaging of her brain to rule out neurological causes.  She agrees.

## 2019-05-13 NOTE — Assessment & Plan Note (Signed)
Could be secondary to back pain. Chronic and continued, no acute changes.  Vascular evaluation without cause. She does not wish to proceed with EMG testing given the potential discomfort during exam.  She does have weakness and cannot stand longer than 60 seconds.  She is working with PT.  Given continued weakness coupled with memory difficulties and low vitamin D we will obtain imaging of her brain to rule out neurological causes.  She agrees.

## 2019-05-13 NOTE — Patient Instructions (Addendum)
Call the main line to set up a lab only appointment.  You will be contacted regarding your CT.  Please let us know if you have not been contacted within two weeks.   Please update me with the amount of vitamin D that you are taking aside from the prescription strength vitamin D.  We will see you in February schedule.  It was a pleasure to see you today! Mayra Reel, NP-C

## 2019-05-13 NOTE — Assessment & Plan Note (Signed)
Compliant to 50,000 units weekly along with extra supplemental vitamin D.  Vitamin D level in November 2020 of 21, repeat level pending for next month.

## 2019-05-13 NOTE — Assessment & Plan Note (Addendum)
Chronic for last year, no difficulty with long-term memory. Unclear if this is secondary to anxiety, but she does feel overall well managed by psychiatry.  Given continued weakness coupled with memory difficulties and low vitamin D despite supplementation we will obtain imaging of her brain to rule out neurological causes.  She agrees.

## 2019-05-14 ENCOUNTER — Ambulatory Visit: Payer: BC Managed Care – PPO

## 2019-05-15 ENCOUNTER — Inpatient Hospital Stay: Admission: RE | Admit: 2019-05-15 | Payer: BC Managed Care – PPO | Source: Ambulatory Visit

## 2019-05-18 ENCOUNTER — Other Ambulatory Visit: Payer: Self-pay

## 2019-05-18 DIAGNOSIS — I5032 Chronic diastolic (congestive) heart failure: Secondary | ICD-10-CM

## 2019-05-18 NOTE — Progress Notes (Signed)
Completed 2 week f/u

## 2019-05-19 ENCOUNTER — Ambulatory Visit (INDEPENDENT_AMBULATORY_CARE_PROVIDER_SITE_OTHER)
Admission: RE | Admit: 2019-05-19 | Discharge: 2019-05-19 | Disposition: A | Discharge status: Home / Self Care | Payer: BC Managed Care – PPO | Source: Ambulatory Visit | Attending: Primary Care | Admitting: Primary Care

## 2019-05-19 ENCOUNTER — Ambulatory Visit: Payer: BC Managed Care – PPO

## 2019-05-19 ENCOUNTER — Other Ambulatory Visit: Payer: Self-pay

## 2019-05-19 DIAGNOSIS — R29898 Other symptoms and signs involving the musculoskeletal system: Secondary | ICD-10-CM

## 2019-05-19 DIAGNOSIS — R413 Other amnesia: Secondary | ICD-10-CM

## 2019-05-20 ENCOUNTER — Ambulatory Visit: Payer: BC Managed Care – PPO

## 2019-05-21 ENCOUNTER — Ambulatory Visit: Payer: BC Managed Care – PPO

## 2019-05-21 ENCOUNTER — Other Ambulatory Visit: Payer: BC Managed Care – PPO

## 2019-05-21 ENCOUNTER — Ambulatory Visit (INDEPENDENT_AMBULATORY_CARE_PROVIDER_SITE_OTHER): Payer: BC Managed Care – PPO | Admitting: Psychology

## 2019-05-21 DIAGNOSIS — F3289 Other specified depressive episodes: Secondary | ICD-10-CM | POA: Diagnosis not present

## 2019-05-26 ENCOUNTER — Other Ambulatory Visit (INDEPENDENT_AMBULATORY_CARE_PROVIDER_SITE_OTHER): Payer: BC Managed Care – PPO

## 2019-05-26 ENCOUNTER — Other Ambulatory Visit: Payer: Self-pay

## 2019-05-26 ENCOUNTER — Ambulatory Visit: Payer: BC Managed Care – PPO

## 2019-05-26 DIAGNOSIS — I10 Essential (primary) hypertension: Secondary | ICD-10-CM | POA: Diagnosis not present

## 2019-05-26 DIAGNOSIS — R413 Other amnesia: Secondary | ICD-10-CM | POA: Diagnosis not present

## 2019-05-26 DIAGNOSIS — E559 Vitamin D deficiency, unspecified: Secondary | ICD-10-CM | POA: Diagnosis not present

## 2019-05-26 DIAGNOSIS — M79604 Pain in right leg: Secondary | ICD-10-CM | POA: Diagnosis not present

## 2019-05-26 DIAGNOSIS — M2550 Pain in unspecified joint: Secondary | ICD-10-CM | POA: Diagnosis not present

## 2019-05-26 DIAGNOSIS — R29898 Other symptoms and signs involving the musculoskeletal system: Secondary | ICD-10-CM | POA: Diagnosis not present

## 2019-05-26 DIAGNOSIS — E538 Deficiency of other specified B group vitamins: Secondary | ICD-10-CM | POA: Diagnosis not present

## 2019-05-26 DIAGNOSIS — M79605 Pain in left leg: Secondary | ICD-10-CM | POA: Diagnosis not present

## 2019-05-27 ENCOUNTER — Other Ambulatory Visit: Payer: Self-pay

## 2019-05-27 ENCOUNTER — Encounter: Payer: BC Managed Care – PPO | Admitting: *Deleted

## 2019-05-27 ENCOUNTER — Ambulatory Visit: Payer: BC Managed Care – PPO

## 2019-05-27 DIAGNOSIS — I5032 Chronic diastolic (congestive) heart failure: Secondary | ICD-10-CM

## 2019-05-27 DIAGNOSIS — R0600 Dyspnea, unspecified: Secondary | ICD-10-CM

## 2019-05-27 DIAGNOSIS — R0609 Other forms of dyspnea: Secondary | ICD-10-CM

## 2019-05-27 LAB — BASIC METABOLIC PANEL
BUN: 19 mg/dL (ref 6–23)
CO2: 24 mEq/L (ref 19–32)
Calcium: 9.4 mg/dL (ref 8.4–10.5)
Chloride: 99 mEq/L (ref 96–112)
Creatinine, Ser: 0.8 mg/dL (ref 0.40–1.20)
GFR: 74.23 mL/min (ref >60.00)
Glucose, Bld: 212 mg/dL — ABNORMAL HIGH (ref 70–99)
Potassium: 4.7 mEq/L (ref 3.5–5.1)
Sodium: 134 mEq/L — ABNORMAL LOW (ref 135–145)

## 2019-05-27 LAB — SEDIMENTATION RATE: Sed Rate: 68 mm/hr — ABNORMAL HIGH (ref 0–30)

## 2019-05-27 LAB — VITAMIN B12: Vitamin B-12: 1110 pg/mL — ABNORMAL HIGH (ref 211–911)

## 2019-05-27 LAB — VITAMIN D 25 HYDROXY (VIT D DEFICIENCY, FRACTURES): VITD: 19.61 ng/mL — ABNORMAL LOW (ref 30.00–100.00)

## 2019-05-27 NOTE — Progress Notes (Signed)
Completed virtual visit today.  Next follow up scheduled with Brandy [name] on 2/1 and me on 2/10.  She would like to see if she can change to a Thurs with Brandy [name] so it's not as long between calls.   Brandy [name] is doing okay at home.  She had a lot of questions about her exercise. She was still not sure how to start going.  We talked about downloading [employer] to help prompt her to exercise her three days a week and to record her vitals.  Resent SMS for app. She is going to start with walking and/or staff videos.  We reviewed the order for videos (warm up, weights, cardio, stretch/cool down) and walking.  She feels more confident with how to get started now.  She was discouraged today as her weight was up 2 lb and she was feeling more SOB than normal.  We talked about possible causes and she admitted to doing more at home the last few days (more than her normal) and thinks she has overdone it.  We talked about DOMS and how it can make you feel worse for a few days but it will improve.  This is also a symptoms of her heart failure so we talked about combatting that as well.  She is going to give it another day to see if she improves and move a little more today to help release soreness and get fluid moving out.  She has not been checking her blood pressures routinely but will start to get into that habit since the app will remind her to track it.  She is eager to get moving and to start feeling better again.

## 2019-05-28 ENCOUNTER — Ambulatory Visit: Payer: BC Managed Care – PPO

## 2019-05-28 ENCOUNTER — Ambulatory Visit (INDEPENDENT_AMBULATORY_CARE_PROVIDER_SITE_OTHER): Payer: BC Managed Care – PPO | Admitting: Psychology

## 2019-05-28 DIAGNOSIS — M2550 Pain in unspecified joint: Secondary | ICD-10-CM

## 2019-05-28 DIAGNOSIS — R29898 Other symptoms and signs involving the musculoskeletal system: Secondary | ICD-10-CM

## 2019-05-28 DIAGNOSIS — M79641 Pain in right hand: Secondary | ICD-10-CM

## 2019-05-28 DIAGNOSIS — F3289 Other specified depressive episodes: Secondary | ICD-10-CM | POA: Diagnosis not present

## 2019-05-28 DIAGNOSIS — R413 Other amnesia: Secondary | ICD-10-CM

## 2019-05-28 DIAGNOSIS — M79604 Pain in right leg: Secondary | ICD-10-CM

## 2019-05-28 DIAGNOSIS — G8929 Other chronic pain: Secondary | ICD-10-CM

## 2019-05-28 LAB — RHEUMATOID FACTOR: Rheumatoid fact SerPl-aCnc: <14 IU/mL (ref <14)

## 2019-05-28 LAB — ANTI-NUCLEAR AB-TITER (ANA TITER): ANA Titer 1: 1:40 {titer} — ABNORMAL HIGH

## 2019-05-28 LAB — ANA: Anti Nuclear Antibody (ANA): POSITIVE — AB

## 2019-05-28 LAB — CYCLIC CITRUL PEPTIDE ANTIBODY, IGG: Cyclic Citrullin Peptide Ab: <16 UNITS

## 2019-05-29 ENCOUNTER — Telehealth: Payer: Self-pay | Admitting: Cardiovascular Disease

## 2019-05-29 NOTE — Telephone Encounter (Signed)
Recieved request from : [employer]  Scanned to releases   Forwarded to [employer] for processing via fax

## 2019-06-02 ENCOUNTER — Ambulatory Visit: Payer: BC Managed Care – PPO

## 2019-06-03 ENCOUNTER — Ambulatory Visit: Payer: BC Managed Care – PPO

## 2019-06-03 DIAGNOSIS — F411 Generalized anxiety disorder: Secondary | ICD-10-CM | POA: Diagnosis not present

## 2019-06-03 DIAGNOSIS — F5105 Insomnia due to other mental disorder: Secondary | ICD-10-CM | POA: Diagnosis not present

## 2019-06-03 DIAGNOSIS — F331 Major depressive disorder, recurrent, moderate: Secondary | ICD-10-CM | POA: Diagnosis not present

## 2019-06-04 ENCOUNTER — Ambulatory Visit: Payer: BC Managed Care – PPO

## 2019-06-04 ENCOUNTER — Ambulatory Visit (INDEPENDENT_AMBULATORY_CARE_PROVIDER_SITE_OTHER): Payer: BC Managed Care – PPO | Admitting: Psychology

## 2019-06-04 DIAGNOSIS — F3289 Other specified depressive episodes: Secondary | ICD-10-CM | POA: Diagnosis not present

## 2019-06-09 ENCOUNTER — Ambulatory Visit: Payer: BC Managed Care – PPO

## 2019-06-10 ENCOUNTER — Ambulatory Visit: Payer: BC Managed Care – PPO

## 2019-06-11 ENCOUNTER — Ambulatory Visit: Payer: BC Managed Care – PPO

## 2019-06-11 ENCOUNTER — Ambulatory Visit (INDEPENDENT_AMBULATORY_CARE_PROVIDER_SITE_OTHER): Payer: BC Managed Care – PPO | Admitting: Psychology

## 2019-06-11 DIAGNOSIS — F3289 Other specified depressive episodes: Secondary | ICD-10-CM | POA: Diagnosis not present

## 2019-06-12 ENCOUNTER — Encounter: Payer: Self-pay | Admitting: *Deleted

## 2019-06-12 ENCOUNTER — Telehealth: Payer: Self-pay | Admitting: Primary Care

## 2019-06-12 NOTE — Telephone Encounter (Signed)
[employer] paperwork in [employer] in box for review and signature

## 2019-06-12 NOTE — Telephone Encounter (Signed)
Completed and placed on Robins desk. 

## 2019-06-15 NOTE — Telephone Encounter (Signed)
Paperwork faxed °

## 2019-06-16 ENCOUNTER — Ambulatory Visit: Payer: BC Managed Care – PPO

## 2019-06-17 ENCOUNTER — Ambulatory Visit (INDEPENDENT_AMBULATORY_CARE_PROVIDER_SITE_OTHER): Payer: BC Managed Care – PPO | Admitting: Primary Care

## 2019-06-17 ENCOUNTER — Telehealth: Payer: Self-pay | Admitting: *Deleted

## 2019-06-17 ENCOUNTER — Encounter: Payer: Self-pay | Admitting: Primary Care

## 2019-06-17 ENCOUNTER — Other Ambulatory Visit: Payer: Self-pay

## 2019-06-17 ENCOUNTER — Ambulatory Visit: Payer: BC Managed Care – PPO

## 2019-06-17 VITALS — BP 130/82 | HR 108 | Temp 96.4°F | Ht 67.0 in | Wt 376.0 lb

## 2019-06-17 DIAGNOSIS — F331 Major depressive disorder, recurrent, moderate: Secondary | ICD-10-CM | POA: Diagnosis not present

## 2019-06-17 DIAGNOSIS — I10 Essential (primary) hypertension: Secondary | ICD-10-CM

## 2019-06-17 DIAGNOSIS — M5442 Lumbago with sciatica, left side: Secondary | ICD-10-CM

## 2019-06-17 DIAGNOSIS — E119 Type 2 diabetes mellitus without complications: Secondary | ICD-10-CM

## 2019-06-17 DIAGNOSIS — M79604 Pain in right leg: Secondary | ICD-10-CM

## 2019-06-17 DIAGNOSIS — N949 Unspecified condition associated with female genital organs and menstrual cycle: Secondary | ICD-10-CM

## 2019-06-17 DIAGNOSIS — G8929 Other chronic pain: Secondary | ICD-10-CM

## 2019-06-17 DIAGNOSIS — R29898 Other symptoms and signs involving the musculoskeletal system: Secondary | ICD-10-CM

## 2019-06-17 DIAGNOSIS — R413 Other amnesia: Secondary | ICD-10-CM

## 2019-06-17 DIAGNOSIS — M79605 Pain in left leg: Secondary | ICD-10-CM

## 2019-06-17 DIAGNOSIS — F411 Generalized anxiety disorder: Secondary | ICD-10-CM

## 2019-06-17 DIAGNOSIS — R32 Unspecified urinary incontinence: Secondary | ICD-10-CM

## 2019-06-17 DIAGNOSIS — N950 Postmenopausal bleeding: Secondary | ICD-10-CM

## 2019-06-17 DIAGNOSIS — M2550 Pain in unspecified joint: Secondary | ICD-10-CM

## 2019-06-17 DIAGNOSIS — N9489 Other specified conditions associated with female genital organs and menstrual cycle: Secondary | ICD-10-CM | POA: Insufficient documentation

## 2019-06-17 LAB — POCT GLYCOSYLATED HEMOGLOBIN (HGB A1C): Hemoglobin A1C: 9.2 % — AB (ref 4.0–5.6)

## 2019-06-17 NOTE — Assessment & Plan Note (Signed)
Seems to be experiencing stress and urgency incontinence.  Continue Kegel exercises.  Consider pelvic floor PT.

## 2019-06-17 NOTE — Progress Notes (Signed)
Subjective:    Patient ID: Brandy [name], female    DOB: 07-May-1965, 54 y.o.   MRN: [id number]  HPI  This visit occurred during the SARS-CoV-2 public health emergency.  Safety protocols were in place, including screening questions prior to the visit, additional usage of staff PPE, and extensive cleaning of exam room while observing appropriate contact time as indicated for disinfecting solutions.   Brandy [name] is a 54 year old female with a history of hypertension, venous insufficiency, type 2 diabetes, chronic bilateral lower back pain, morbid obesity, anxiety and depression, poor memory, lower extremity weakness who presents today for follow up.  1) Type 2 Diabetes:   Current medications include: Glipizide 10 mg BID, Basaglar 35 units HS, Metformin 1000 mg daily.    She has been checking her blood glucose infrequently since Christmas 2020 as she lost her receiver portion of her glucometer. She's now checking as of last week. She stopped using her insulin around Christmas, resumed one week ago.   She started feeling "sick" without her insulin which is why she resumed.   Last week: AM fasting: 285, 170 2 hours after dinner: 280, 180  She should be getting a new receiver in the mail soon.   Last A1C: 9.1 in November 2020, 9.2 today.  Last Eye Exam: Due in December 2021 Last Foot Exam: Due in November 2021 Pneumonia Vaccination: Completed in 2020 ACE/ARB: lisinopril  Statin: atorvastatin, stopped several months ago.   2) Anxiety and Depression: Currently following with psychiatry and is doing better overall. Compliant to Abilify, duloxetine, citalopram, trazodone.  3) Chronic Back Pain/Lower Extremity Pain: She is managed on Celebrex 100 mg BID, duloxetine 90 mg, gabapentin 300 mg TID. She is scheduled for next week to see Rheumatology.  4) Poor Memory/Lower Extremity Weakness: Chronic and continued.  CT head completed in January 2021 which revealed mild frontal and temporal lobe  atrophy, otherwise unremarkable.  She is scheduled to see neurology in late March 2021.    5) PAD/Hypertension/Venous Insufficiency: Following with cardiology. Managed on lisinopril-HCTZ 20-12.5 mg, atorvastatin (stopped several months ago). She denies chest pain.   BP Readings from Last 3 Encounters:  06/17/19 130/82  04/20/19 120/70  03/30/19 (!) 130/94   6) Urinary Incontinence/Vaginal Burning/Vaginal bleeding: Intermittent daily incontinence that occurs with urgency and pressure. She is trying to do [place] exercises daily. Also with several month history of vaginal burning, intermittent, not associated with urination. Post menopausal, no menstrual cycle for over one year. Occasional vaginal itching.  Wet prep completed in November 2020 for similar symptoms, this was unremarkable.  We are waiting for her to provide [place] a urine sample which is requested months ago.  Last week she was putting together a rowing machine, sitting on the floor while lifting. She woke up the following day with bright red vaginal bleeding, bleed for two days, no bleeding since. She has a history of breakthrough vaginal bleeding pre-menopausal with heavy lifting.    Review of Systems  Respiratory: Negative for shortness of breath.   Cardiovascular: Negative for chest pain.  Genitourinary: Positive for vaginal bleeding. Negative for dysuria, frequency and vaginal discharge.       Urinary stress and urgency incontinence.  Vaginal burning.  Musculoskeletal: Positive for arthralgias and back pain.  Neurological:       See HPI  Psychiatric/Behavioral:       Doing better on current regimen, following with psychiatry.       Past Medical History:  Diagnosis Date  .  Anxiety   . Depression   . Diabetes mellitus without complication ([place])   . Dysmetabolic syndrome X   . Hypertension   . Hypertriglyceridemia   . Obesity   . Tetanus vaccine causing adverse effect in therapeutic use      Social History    Socioeconomic History  . Marital status: Married    Spouse name: Not on file  . Number of children: 3  . Years of education: Not on file  . Highest education level: Not on file  Occupational History  . Occupation: Primary school teacher: [employer]  Tobacco Use  . Smoking status: Never Smoker  . Smokeless tobacco: Never Used  Substance and Sexual Activity  . Alcohol use: Yes    Comment: rarely - once a year  . Drug use: No  . Sexual activity: Not on file  Other Topics Concern  . Not on file  Social History Narrative   Designer, jewellery      Married; 3 kids (middle child-severely autistic)      No regular exercise   Social Determinants of Health   Financial Resource Strain:   . Difficulty of Paying Living Expenses: Not on file  Food Insecurity:   . Worried About Charity fundraiser in the Last Year: Not on file  . Ran Out of Food in the Last Year: Not on file  Transportation Needs:   . Lack of Transportation (Medical): Not on file  . Lack of Transportation (Non-Medical): Not on file  Physical Activity:   . Days of Exercise per Week: Not on file  . Minutes of Exercise per Session: Not on file  Stress:   . Feeling of Stress : Not on file  Social Connections:   . Frequency of Communication with Friends and Family: Not on file  . Frequency of Social Gatherings with Friends and Family: Not on file  . Attends Religious Services: Not on file  . Active Member of Clubs or Organizations: Not on file  . Attends Archivist Meetings: Not on file  . Marital Status: Not on file  Intimate Partner Violence:   . Fear of Current or Ex-Partner: Not on file  . Emotionally Abused: Not on file  . Physically Abused: Not on file  . Sexually Abused: Not on file    Past Surgical History:  Procedure Laterality Date  . 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 Rheum arthritis Mother   . COPD Mother   .  Hypertension Father   . Heart disease Maternal Grandmother   . Stroke Maternal Grandmother   . Alcohol abuse Maternal Grandmother   . Alcohol abuse Maternal Grandfather   . Aneurysm Paternal Grandmother   . Autism Son        severe  . Diabetes Paternal Aunt     Allergies  Allergen Reactions  . Diphenhydramine Hcl     REACTION: minor rash  . Penicillins     REACTION: reaction as a child    Current Outpatient Medications on File Prior to Visit  Medication Sig Dispense Refill  . ARIPiprazole (ABILIFY) 5 MG tablet Take 5 mg by mouth daily.    Marland Kitchen aspirin EC 81 MG tablet Take 81 mg by mouth daily.    Marland Kitchen atorvastatin (LIPITOR) 10 MG tablet TAKE 1 TABLET (10 MG TOTAL) BY MOUTH DAILY. FOR CHOLESTEROL. 90 tablet 0  . celecoxib (CELEBREX) 100  MG capsule Take 1 capsule (100 mg total) by mouth 2 (two) times daily. As needed for pain. 180 capsule 1  . cetirizine (ZYRTEC) 10 MG tablet TAKE 1 TABLET BY MOUTH EVERY DAY 90 tablet 0  . cholecalciferol (VITAMIN D3) 25 MCG (1000 UT) tablet Take 1,000 Units by mouth daily.    . citalopram (CELEXA) 20 MG tablet Take 20 mg by mouth daily.    . Continuous Blood Gluc Receiver (DEXCOM G6 RECEIVER) DEVI Inject 1 Device into the skin daily. 4 each 1  . Continuous Blood Gluc Sensor (DEXCOM G6 SENSOR) MISC Inject 1 Device into the skin daily. 3 each 1  . Continuous Blood Gluc Transmit (DEXCOM G6 TRANSMITTER) MISC Inject 1 Device into the skin daily. 4 each 1  . Cyanocobalamin (VITAMIN B-12 PO) Take 1 tablet by mouth daily.    . DULoxetine (CYMBALTA) 30 MG capsule Take 30 mg by mouth daily.     . DULoxetine (CYMBALTA) 60 MG capsule Take 60 mg by mouth daily.    . folic acid (FOLVITE) 1 MG tablet Take 1 mg by mouth daily.    Marland Kitchen gabapentin (NEURONTIN) 300 MG capsule Take 300 mg by mouth 3 (three) times daily.    Marland Kitchen glipiZIDE (GLUCOTROL) 10 MG tablet TAKE 1 TABLET (10 MG TOTAL) BY MOUTH 2 (TWO) TIMES DAILY BEFORE A MEAL. FOR DIABETES. 180 tablet 3  . hydrocortisone  2.5 % cream Apply topically 2 (two) times daily.    . Insulin Glargine (BASAGLAR KWIKPEN) 100 UNIT/ML SOPN Inject 0.35 mLs (35 Units total) into the skin at bedtime. 15 mL 2  . Insulin Pen Needle (PEN NEEDLES) 31G X 6 MM MISC Use nightly with insulin. 100 each 2  . lisinopril-hydrochlorothiazide (ZESTORETIC) 20-12.5 MG tablet TAKE 1 TABLET BY MOUTH DAILY FOR BLOOD PRESSURE 90 tablet 1  . metFORMIN (GLUCOPHAGE-XR) 500 MG 24 hr tablet TAKE 2 TABLETS (1,000 MG TOTAL) BY MOUTH DAILY WITH BREAKFAST. FOR DIABETES. 180 tablet 1  . metroNIDAZOLE (METROCREAM) 0.75 % cream Apply 1 application topically daily.    . Multiple Minerals-Vitamins (CALCIUM-MAGNESIUM-ZINC-D3 PO) Take 1 tablet by mouth daily.    . traZODone (DESYREL) 50 MG tablet Take 50 mg by mouth at bedtime as needed. Taking 1/2 tab daily    . Vitamin D, Ergocalciferol, (DRISDOL) 1.25 MG (50000 UT) CAPS capsule TAKE 1 CAPSULE (50,000 UNITS TOTAL) BY MOUTH EVERY 7 (SEVEN) DAYS. 12 capsule 1   No current facility-administered medications on file prior to visit.    BP 130/82   Pulse (!) 108   Temp (!) 96.4 F (35.8 C) (Temporal)   Ht 5\' 7"  (1.702 m)   Wt (!) 376 lb (170.6 kg)   LMP  (LMP Unknown)   SpO2 98%   BMI 58.89 kg/m    Objective:   Physical Exam  Constitutional: She appears well-nourished.  Cardiovascular: Normal rate and regular rhythm.  Respiratory: Effort normal and breath sounds normal.  Musculoskeletal:     Cervical back: Neck supple.  Skin: Skin is warm and dry.  Psychiatric: She has a normal mood and affect.           Assessment & Plan:

## 2019-06-17 NOTE — Assessment & Plan Note (Signed)
Stable in the office today, continue lisinopril-hydrochlorothiazide.

## 2019-06-17 NOTE — Assessment & Plan Note (Signed)
Acute for 2 days last week after heavy lifting the day prior.  Apparently this is not new, did occur premenopausal.  Would recommend transvaginal/pelvic ultrasound, will notify patient and await response.

## 2019-06-17 NOTE — Assessment & Plan Note (Signed)
Uncontrolled, A1c of 9.2 which is about the same as last check in November 2020.  This is largely due to lack of insulin over the last several months.  Strongly advised she start checking glucose readings at least twice daily, every day.  She has already resumed Basaglar at 35 units, continue same.  Continue glipizide and Metformin.  Eye and foot exams up-to-date. Managed on ACE, statin reinitiated. Pneumonia vaccination up-to-date.  She will send glucose readings in 2 weeks, may need to adjust Basaglar to 40 units. Follow-up in 3 months.

## 2019-06-17 NOTE — Assessment & Plan Note (Signed)
Continued, to be seeing neurology in March 2021. CT head with mild frontal and temporal lobe atrophy.

## 2019-06-17 NOTE — Assessment & Plan Note (Signed)
Intermittent for the last several months. Wet prep in November 2020 unremarkable.  UA pending, she cannot provide a sample in the office and will return sample when able.  Could be secondary to vaginal atrophy as she is now postmenopausal.  Discussed vaginal moisturizers, she will update.

## 2019-06-17 NOTE — Patient Instructions (Signed)
Return the urine specimen as discussed.  Continue basaglar insulin 35 units at night as discussed.  Start checking your blood sugar levels.  Appropriate times to check your blood sugar levels are:  -Before any meal (breakfast, lunch, dinner) -Two hours after any meal (breakfast, lunch, dinner) -Bedtime  Record your readings and send them to me two weeks after you receive your meter.   Rheumatology Visit: Discuss joint aches, back pain, wrist pain, etc. Mention leg weakness and how you cannot stand very long.  Neurology Visit: Discuss poor short term memory problem, difficulty with word finding, leg weakness and how you cannot stand for very long.   Try a vaginal moisturizer 2-3 times weekly for burning.   Please schedule a follow up appointment in 3 months for diabetes check.   It was a pleasure to see you today!

## 2019-06-17 NOTE — Assessment & Plan Note (Signed)
Improved on regimen per psychiatry, continue same.

## 2019-06-17 NOTE — Assessment & Plan Note (Signed)
All work-up for RA negative, she did have questionable ANA.  Patient to be seen by rheumatology next week.

## 2019-06-17 NOTE — Telephone Encounter (Signed)
Attempted to contact pt for follow up from last week.  Left message.

## 2019-06-17 NOTE — Assessment & Plan Note (Signed)
Continued, improved with Celebrex, duloxetine, gabapentin.  She will be seeing rheumatology next week.

## 2019-06-17 NOTE — Assessment & Plan Note (Signed)
Doing well on current regimen per psychiatry.  Continue same.  

## 2019-06-17 NOTE — Assessment & Plan Note (Signed)
Chronic and continued, she will be seeing rheumatology next week.  Continue Celebrex, duloxetine, gabapentin.

## 2019-06-17 NOTE — Assessment & Plan Note (Signed)
Chronic and continued, sits for most of the day.  She will be seeing neurology next month.

## 2019-06-18 ENCOUNTER — Ambulatory Visit (INDEPENDENT_AMBULATORY_CARE_PROVIDER_SITE_OTHER): Payer: BC Managed Care – PPO | Admitting: Psychology

## 2019-06-18 ENCOUNTER — Ambulatory Visit: Payer: BC Managed Care – PPO

## 2019-06-18 ENCOUNTER — Ambulatory Visit: Payer: BC Managed Care – PPO | Admitting: Neurology

## 2019-06-18 DIAGNOSIS — F3289 Other specified depressive episodes: Secondary | ICD-10-CM

## 2019-06-22 NOTE — Telephone Encounter (Signed)
Pt aware  °Copy for pt °Copy for scan °

## 2019-06-23 ENCOUNTER — Other Ambulatory Visit: Payer: Self-pay

## 2019-06-23 ENCOUNTER — Ambulatory Visit: Payer: BC Managed Care – PPO

## 2019-06-23 ENCOUNTER — Encounter: Payer: Self-pay | Admitting: Neurology

## 2019-06-23 ENCOUNTER — Ambulatory Visit (INDEPENDENT_AMBULATORY_CARE_PROVIDER_SITE_OTHER): Payer: BC Managed Care – PPO | Admitting: Neurology

## 2019-06-23 VITALS — BP 118/82 | HR 117 | Temp 97.1°F | Ht 67.0 in | Wt 381.0 lb

## 2019-06-23 DIAGNOSIS — Z6841 Body Mass Index (BMI) 40.0 and over, adult: Secondary | ICD-10-CM

## 2019-06-23 DIAGNOSIS — G4719 Other hypersomnia: Secondary | ICD-10-CM

## 2019-06-23 DIAGNOSIS — E1140 Type 2 diabetes mellitus with diabetic neuropathy, unspecified: Secondary | ICD-10-CM | POA: Diagnosis not present

## 2019-06-23 DIAGNOSIS — E662 Morbid (severe) obesity with alveolar hypoventilation: Secondary | ICD-10-CM | POA: Diagnosis not present

## 2019-06-23 DIAGNOSIS — F321 Major depressive disorder, single episode, moderate: Secondary | ICD-10-CM | POA: Diagnosis not present

## 2019-06-23 DIAGNOSIS — R5382 Chronic fatigue, unspecified: Secondary | ICD-10-CM

## 2019-06-23 DIAGNOSIS — I5031 Acute diastolic (congestive) heart failure: Secondary | ICD-10-CM

## 2019-06-23 NOTE — Progress Notes (Signed)
SLEEP MEDICINE CLINIC    Provider:  Melvyn Novas, MD  Primary Care Physician:  Doreene Nest, NP [address] [place] [address]     Referring Provider: Doreene Nest, Np [address] [place],  [place] [address]          Chief Complaint according to patient   Patient presents with:    . New Patient (Initial Visit)     pt alone, rm 10  This patient has been referred to rheumatology, neurosurgery, and had a head CT scan c/o jan 19th 2021. She was having a difficulty with word finding, afraid of cognitive decline. She has not been able to work for a year. She is excessively daytime sleepy.       HISTORY OF PRESENT ILLNESS:  Brandy [name] is a 54 year old Caucasian female patient seen here upon NP referral on 06/23/2019 from [facility] -    Chief concern according to patient : Brandy [name] has reported difficulties with multitasking, her level of alertness, ability to concentrate and word finding delays at least.  This seems to have gone on since the later half of 2019.  Her recent referral describes her as a 54 year old female with morbid obesity, chronic pain, diabetes poorly controlled, hyperlipidemia, and new onset of recent onset of short-term memory difficulties and excessive daytime sleepiness.  She has been evaluated by neurosurgery ( not sure which physician has seen her )  a CT of the head was completed showing some frontal atrophy, interestingly no EMG or nerve conduction study was obtained.  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   She also reports twitching or jerking movements in her lower extremities and pain when she stands-  Diabetic neuropathy is managed by PCP.   She has also diagnosis of chronic vitamin D deficiency despite supplement intake.  Her fatigue has not improved since she is taking vitamin D.  Her sleepiness has slightly improved as she is not taking quite as many naps and as 7 in onset as she used to  take last year.  Her husband is very concerned about these abrupt starts of sleepiness attacks.  Below is an ANA titer was borderline positive, a rheumatological work-up is pending at this time.  " I had a sleep study years ago- 03/2018 (?) and was sleepy -but no explanation was found. This was an in -lab-study at [facility]" She was unable to comply with her pulmonologist request to track her sleep through apps.    I have the pleasure of seeing Brandy [name] today, a right -handed White or Caucasian female with a possible sleep disorder. She  has a past medical history of Anxiety, Depression, Diabetes mellitus without complication (HCC), Dysmetabolic syndrome X, Hypertension, Hypertriglyceridemia, Obesity, and Tetanus vaccine causing adverse effect in therapeutic use.     Sleep relevant medical history: Nocturia 4 times, dreams but has not acted dreams out.   cervical spine DDD, and shoulder pain.  " My body is pain "    Family medical /sleep history: no other family member on CPAP with OSA, insomnia, no sleep walkers.  her son has a son with severe autism spectrum disorder.    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 -  she lives in a household with 4 persons, husband and 2 sons.   Pets are not  present. Tobacco use; never -  ETOH use: 2 a year.Caffeine intake in form of Coffee( 2 cups in AM ) Soda( 4 cans a day / PM) Tea ( if not drinking sodas) . She doesn' t use energy drinks. Regular exercise in form of PT tice a week, stopped at [place]. Now in PT with pulmonary group.       Sleep habits are as follows: The patient's dinner time is between 7 PM. The patient goes to bed  To watch Tv and falls asleep at midight and continues to sleep for 2-3  hours, wakes for several  bathroom breaks. The preferred sleep position is left sided with the support of 2 pillows. Dreams are reportedly infrequent.  9.30 AM is the usual rise time.The  patient wakes up spontaneously at 7- 9 AM. She reports not feeling refreshed or restored in AM, with symptoms such as dry mouth , overall aching and soreness, and residual fatigue.  Naps are taken infrequently, lasting from 2-3 and are more refreshing than nocturnal sleep.      Review of Systems: Out of a complete 14 system review, the patient complains of only the following symptoms, and all other reviewed systems are negative.:  Fatigue, sleepiness , snoring, fragmented sleep, Insomnia, poor sleep hygiene, caffeine  overuse.  Unspecified aches and pains.  Neuropathy, uncontrolled diabetes, anxiety and depression.  Subjective memory loss-   Montreal Cognitive Assessment  06/23/2019  Visuospatial/ Executive (0/5) 5  Naming (0/3) 3  Attention: Read list of digits (0/2) 2  Attention: Read list of letters (0/1) 1  Attention: Serial 7 subtraction starting at 100 (0/3) 3  Language: Repeat phrase (0/2) 2  Language : Fluency (0/1) 1  Abstraction (0/2) 2  Delayed Recall (0/5) 3  Orientation (0/6) 5  Total 27   Short term memory recall, may be lack of attention.    How likely are you to doze in the following situations: 0 = not likely, 1 = slight chance, 2 = moderate chance, 3 = high chance   Sitting and Reading? 3 Watching Television? 1 Sitting inactive in a public place (theater or meeting)? 2 As a passenger in a car for an hour without a break? 2 Lying down in the afternoon when circumstances permit? 3 Sitting and talking to someone?0 Sitting quietly after lunch without alcohol?2 In a car, while stopped for a few minutes in traffic?0   Total =13 / 24 points   FSS endorsed at 40/ 63 points.   Social History   Socioeconomic History  . Marital status: Married    Spouse name: Not on file  . Number of children: 3  . Years of education: Not on file  . Highest education level: Not on file  Occupational History  . Occupation: Primary school teacher: [employer]  Tobacco  Use  . Smoking status: Never Smoker  . Smokeless tobacco: Never Used  Substance and Sexual Activity  . Alcohol use: Yes    Comment: rarely - once a year  . Drug use: No  . Sexual activity: Not on file  Other Topics Concern  . Not on file  Social History Narrative   Designer, jewellery      Married; 3 kids (middle child-severely autistic)      No regular exercise   Social Determinants of Health   Financial Resource Strain:   . Difficulty of Paying Living Expenses: Not on file  Food Insecurity:   . Worried About Charity fundraiser in the Last Year: Not on  file  . Ran Out of Food in the Last Year: Not on file  Transportation Needs:   . Lack of Transportation (Medical): Not on file  . Lack of Transportation (Non-Medical): Not on file  Physical Activity:   . Days of Exercise per Week: Not on file  . Minutes of Exercise per Session: Not on file  Stress:   . Feeling of Stress : Not on file  Social Connections:   . Frequency of Communication with Friends and Family: Not on file  . Frequency of Social Gatherings with Friends and Family: Not on file  . Attends Religious Services: Not on file  . Active Member of Clubs or Organizations: Not on file  . Attends Banker Meetings: Not on file  . Marital Status: Not on file    Family History  Problem Relation Age of Onset  . Rheum arthritis Mother   . COPD Mother   . Hypertension Father   . Heart disease Maternal Grandmother   . Stroke Maternal Grandmother   . Alcohol abuse Maternal Grandmother   . Alcohol abuse Maternal Grandfather   . Aneurysm Paternal Grandmother   . Autism Son        severe  . Diabetes Paternal Aunt     Past Medical History:  Diagnosis Date  . Anxiety   . Depression   . Diabetes mellitus without complication (HCC)   . Dysmetabolic syndrome X   . Hypertension   . Hypertriglyceridemia   . Obesity   . Tetanus vaccine causing adverse effect in therapeutic use     Past Surgical History:   Procedure Laterality Date  . 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 ARIPiprazole (ABILIFY) 5 MG tablet Take 5 mg by mouth daily.    Marland Kitchen aspirin EC 81 MG tablet Take 81 mg by mouth daily.    Marland Kitchen atorvastatin (LIPITOR) 10 MG tablet TAKE 1 TABLET (10 MG TOTAL) BY MOUTH DAILY. FOR CHOLESTEROL. 90 tablet 0  . celecoxib (CELEBREX) 100 MG capsule Take 1 capsule (100 mg total) by mouth 2 (two) times daily. As needed for pain. 180 capsule 1  . cetirizine (ZYRTEC) 10 MG tablet TAKE 1 TABLET BY MOUTH EVERY DAY 90 tablet 0  . cholecalciferol (VITAMIN D3) 25 MCG (1000 UT) tablet Take 1,000 Units by mouth daily.    . citalopram (CELEXA) 20 MG tablet Take 20 mg by mouth daily.    . Continuous Blood Gluc Receiver (DEXCOM G6 RECEIVER) DEVI Inject 1 Device into the skin daily. 4 each 1  . Continuous Blood Gluc Sensor (DEXCOM G6 SENSOR) MISC Inject 1 Device into the skin daily. 3 each 1  . Continuous Blood Gluc Transmit (DEXCOM G6 TRANSMITTER) MISC Inject 1 Device into the skin daily. 4 each 1  . Cyanocobalamin (VITAMIN B-12 PO) Take 1 tablet by mouth daily.    . DULoxetine (CYMBALTA) 30 MG capsule Take 30 mg by mouth daily.     . DULoxetine (CYMBALTA) 60 MG capsule Take 60 mg by mouth daily.    . folic acid (FOLVITE) 1 MG tablet Take 1 mg by mouth daily.    Marland Kitchen gabapentin (NEURONTIN) 300 MG capsule Take 300 mg by mouth 3 (three) times daily.    Marland Kitchen glipiZIDE (GLUCOTROL) 10 MG tablet TAKE 1 TABLET (10 MG TOTAL) BY MOUTH 2 (TWO) TIMES DAILY BEFORE A MEAL. FOR DIABETES. 180 tablet  3  . hydrocortisone 2.5 % cream Apply topically 2 (two) times daily.    . Insulin Glargine (BASAGLAR KWIKPEN) 100 UNIT/ML SOPN Inject 0.35 mLs (35 Units total) into the skin at bedtime. 15 mL 2  . Insulin Pen Needle (PEN NEEDLES) 31G X 6 MM MISC Use nightly with insulin. 100 each 2  . lisinopril-hydrochlorothiazide (ZESTORETIC) 20-12.5 MG tablet TAKE  1 TABLET BY MOUTH DAILY FOR BLOOD PRESSURE 90 tablet 1  . metFORMIN (GLUCOPHAGE-XR) 500 MG 24 hr tablet TAKE 2 TABLETS (1,000 MG TOTAL) BY MOUTH DAILY WITH BREAKFAST. FOR DIABETES. 180 tablet 1  . metroNIDAZOLE (METROCREAM) 0.75 % cream Apply 1 application topically daily.    . Multiple Minerals-Vitamins (CALCIUM-MAGNESIUM-ZINC-D3 PO) Take 1 tablet by mouth daily.    . traZODone (DESYREL) 50 MG tablet Take 50 mg by mouth at bedtime as needed. Taking 1/2 tab daily    . Vitamin D, Ergocalciferol, (DRISDOL) 1.25 MG (50000 UT) CAPS capsule TAKE 1 CAPSULE (50,000 UNITS TOTAL) BY MOUTH EVERY 7 (SEVEN) DAYS. 12 capsule 1   No current facility-administered medications on file prior to visit.    Allergies  Allergen Reactions  . Diphenhydramine Hcl     REACTION: minor rash  . Penicillins     REACTION: reaction as a child    Physical exam:  Today's Vitals   06/23/19 1254  BP: 118/82  Pulse: (!) 117  Temp: (!) 97.1 F (36.2 C)  Weight: (!) 381 lb (172.8 kg)  Height: 5\' 7"  (1.702 m)   Body mass index is 59.67 kg/m.   Wt Readings from Last 3 Encounters:  06/23/19 (!) 381 lb (172.8 kg)  06/17/19 (!) 376 lb (170.6 kg)  04/20/19 (!) 378 lb 1.6 oz (171.5 kg)     Ht Readings from Last 3 Encounters:  06/23/19 5\' 7"  (1.702 m)  06/17/19 5\' 7"  (1.702 m)  04/20/19 5\' 7"  (1.702 m)      General: The patient is awake, alert and appears not in acute distress. The patient is well groomed. Head: Normocephalic, atraumatic. Neck is supple. Mallampati 4,  neck circumference:18. 5  inches . Nasal airflow  patent.  Retrognathia is noted.  Dental status: n/a Cardiovascular:  Regular rate and cardiac rhythm by pulse,  without distended neck veins. Respiratory: Lungs are clear to auscultation. No crackles and no wheezing.  She could only hold her breath at rest for 11 seconds (!) . Skin:  With evidence of ankle edema, but this seems not to be pitting edema. Obesity related ?  Trunk: The patient's posture  is erect.   Neurologic exam : The patient is awake and alert, oriented to place and time. She was stating the date as 06-24-2019. Memory subjective described as impaired   Attention span & concentration ability appears normal.  Speech is fluent,  without  dysarthria, dysphonia or aphasia.  Mood and affect are concerned, demure, anxious.    Cranial nerves: no loss of smell or taste reported  Pupils are equal and briskly reactive to light. Funduscopic exam deferred.   Extraocular movements in vertical and horizontal planes were intact and without nystagmus. No Diplopia. Visual fields by finger perimetry are intact. Hearing was intact to soft voice and finger rubbing.    Facial sensation intact to fine touch.  Facial motor strength is symmetric and tongue in midline.  Neck ROM : rotation, tilt and flexion extension were normal for age and shoulder shrug was symmetrical.    Motor exam:  Symmetric bulk, tone and ROM.  Normal tone without cog-wheeling, symmetric grip strength .   Sensory:  Fine touch, pinprick and vibration were normal in the upper extremities. There is loss of vibration in both ankles.   Proprioception tested in the upper extremities was normal.   Coordination: Rapid alternating movements in the fingers/hands were of normal speed.  The Finger-to-nose maneuver was intact without evidence of ataxia, dysmetria or tremor.   Gait and station: Patient could rise unassisted from a seated position, walked without assistive device. The patient appears deconditioned- she has trouble walking even 100 u years without stairs. Short winded.   She was steady, slowed by SOB not mechanical barriers. No involuntary movements.  Stance is of larger width/ base due to super obesity- and the patient turned with 4 steps.  Toe and heel walk were deferred.  Deep tendon reflexes: in the  upper and lower extremities are symmetric and all reflexes are attenuated.  Babinski response was deferred .         After spending a total time of 60 minutes face to face and additional time for physical and neurologic examination, review of laboratory studies,  personal review of imaging studies, reports and results of other testing and review of referral information / records as far as provided in visit, I have established the following assessments:  1)  Excessive daytime sleepiness and fatigue, poor sleep hygiene, I suspect strongly that the patient has obesity hypoventilation.  She is consiidered super-obese , BMI 60.  High risk for OSA, even for CSA.  Her shortness of breath is addressed in pulmonary rehab.  The patient has a diagnosis of diastolic heart failure- cardiology.   2) added conditions of DM, poorly controlled, and explaining her neuropathic findings. She has reportedly a normal venous function.   3) fatigue , non restorative sleep deficiency in Vit D. Other defiencies were not found, B 12 was high. Folic acid was low. ANA positive, awaiting rheumatology evaluation.   4) chronic back pain, joint pain  is expected at her BMI. Please referr to weight and wellness clinic. Diabetes nutrition class has been completed. She is unable to hold on to the diet. psychological support is established    My Plan is to proceed with:  1)  Attended sleep study - we need to look at CHF related sleep breathing dysfunction. Obesity hypoventilation in BMI 60. She had a sleep study in 04-2018- and the results were never transcribed and no technical data were found. (!)   2) husband is the cook in the home, needs to attend the dietary meeting.   3) I am awaiting rheumatological results from [facility] rheumatology.    4) CT atrophy is non specific, MOCA was borderline- I think this is related to depression, attention problems.  I would like to thank Doreene Nest, NP and Doreene Nest, Np [address] [place],  [place] [address] for allowing me to meet with and to take care of this pleasant patient.    In short, Brandy [name] is presenting with a mixture of organic and functional symptoms-  some that can be attributed to her super-obesity and pulmonary/ cardiac underlying disorders.    I plan to follow up either personally or through our NP within 2-3 month.   CC: I will share my notes with Cardiology - Dr Mariah Milling,   Electronically signed by: Melvyn Novas, MD 06/23/2019 1:22 PM  [facility] Board certified by The [employer] of Sleep Medicine and  Diplomate of the [employer] of Sleep Medicine. Board certified In Neurology through the [place], Fellow of the [employer] of Neurology. Medical Director of [employer].

## 2019-06-23 NOTE — Patient Instructions (Signed)

## 2019-06-24 ENCOUNTER — Encounter: Payer: BC Managed Care – PPO | Attending: Cardiovascular Disease | Admitting: *Deleted

## 2019-06-24 ENCOUNTER — Ambulatory Visit: Payer: BC Managed Care – PPO

## 2019-06-24 ENCOUNTER — Other Ambulatory Visit: Payer: Self-pay

## 2019-06-24 DIAGNOSIS — I509 Heart failure, unspecified: Secondary | ICD-10-CM | POA: Insufficient documentation

## 2019-06-24 DIAGNOSIS — Z6841 Body Mass Index (BMI) 40.0 and over, adult: Secondary | ICD-10-CM | POA: Insufficient documentation

## 2019-06-24 DIAGNOSIS — E1159 Type 2 diabetes mellitus with other circulatory complications: Secondary | ICD-10-CM | POA: Insufficient documentation

## 2019-06-24 DIAGNOSIS — R0609 Other forms of dyspnea: Secondary | ICD-10-CM

## 2019-06-24 DIAGNOSIS — Z713 Dietary counseling and surveillance: Secondary | ICD-10-CM | POA: Insufficient documentation

## 2019-06-24 DIAGNOSIS — R0600 Dyspnea, unspecified: Secondary | ICD-10-CM

## 2019-06-24 DIAGNOSIS — I5032 Chronic diastolic (congestive) heart failure: Secondary | ICD-10-CM

## 2019-06-24 DIAGNOSIS — I872 Venous insufficiency (chronic) (peripheral): Secondary | ICD-10-CM | POA: Insufficient documentation

## 2019-06-24 DIAGNOSIS — M2550 Pain in unspecified joint: Secondary | ICD-10-CM | POA: Diagnosis not present

## 2019-06-24 NOTE — Progress Notes (Signed)
Completed virtual follow up today.  Brandy [name] is ready to return to in person rehab.  She is scheduled to restart next week.  We will do a walk test and new ITP on her first visit.   She is doing well with her exercise and has a renewed sense of motivation to get in shape and get moving again. She has a new commitment to exercise and her overall wellbeing.  Her weight has been going up and down and she wants to lose the weight for good.  She is now committed to coming to in person rehab.

## 2019-06-25 ENCOUNTER — Ambulatory Visit: Payer: BC Managed Care – PPO

## 2019-06-25 ENCOUNTER — Ambulatory Visit (INDEPENDENT_AMBULATORY_CARE_PROVIDER_SITE_OTHER): Payer: BC Managed Care – PPO | Admitting: Psychology

## 2019-06-25 DIAGNOSIS — F3289 Other specified depressive episodes: Secondary | ICD-10-CM | POA: Diagnosis not present

## 2019-06-29 ENCOUNTER — Other Ambulatory Visit: Payer: Self-pay

## 2019-06-29 ENCOUNTER — Encounter: Payer: BC Managed Care – PPO | Attending: Cardiovascular Disease | Admitting: *Deleted

## 2019-06-29 DIAGNOSIS — I872 Venous insufficiency (chronic) (peripheral): Secondary | ICD-10-CM | POA: Insufficient documentation

## 2019-06-29 DIAGNOSIS — Z713 Dietary counseling and surveillance: Secondary | ICD-10-CM | POA: Diagnosis not present

## 2019-06-29 DIAGNOSIS — I509 Heart failure, unspecified: Secondary | ICD-10-CM | POA: Diagnosis not present

## 2019-06-29 DIAGNOSIS — R0609 Other forms of dyspnea: Secondary | ICD-10-CM

## 2019-06-29 DIAGNOSIS — R0600 Dyspnea, unspecified: Secondary | ICD-10-CM

## 2019-06-29 DIAGNOSIS — E1159 Type 2 diabetes mellitus with other circulatory complications: Secondary | ICD-10-CM | POA: Insufficient documentation

## 2019-06-29 DIAGNOSIS — Z6841 Body Mass Index (BMI) 40.0 and over, adult: Secondary | ICD-10-CM | POA: Insufficient documentation

## 2019-06-29 DIAGNOSIS — I5032 Chronic diastolic (congestive) heart failure: Secondary | ICD-10-CM

## 2019-06-29 LAB — GLUCOSE, CAPILLARY
Glucose-Capillary: 163 mg/dL — ABNORMAL HIGH (ref 70–99)
Glucose-Capillary: 169 mg/dL — ABNORMAL HIGH (ref 70–99)

## 2019-06-29 NOTE — Progress Notes (Signed)
2Daily Session Note  Patient Details  Name: Brandy [name] MRN: [id number] Date of Birth: 08/19/1965 Referring Provider:     Pulmonary Rehab from 04/06/2019 in [facility] Cardiac [facility] Pulmonary [facility]hab  Referring Provider  [place]      Encounter Date: 06/29/2019  Check In: Session Check In - 06/29/19 [place]      Check-In   Supervising physician immediately available to respond to emergencies  See telemetry face sheet for immediately available ER MD    Location  [facility]-Cardiac & Pulmonary Rehab    Staff Present  Heath Lark, RN, BSN, Lance Sell, BA, ACSM CEP, Exercise Physiologist;Joseph Hood RCP,RRT,BSRT    Virtual Visit  No    Medication changes reported      No    Fall or balance concerns reported     No    Warm-up and Cool-down  Performed on first and last piece of equipment    Resistance Training Performed  Yes    VAD Patient?  No    PAD/SET Patient?  No      Pain Assessment   Currently in Pain?  No/denies          Social History   Tobacco Use  Smoking Status Never Smoker  Smokeless Tobacco Never Used    Goals Met:  Proper associated with RPD/PD & O2 Sat Personal goals reviewed No report of cardiac concerns or symptoms  Goals Unmet:  Not Applicable  Comments: First full day of exercise!  Patient was oriented to gym and equipment including functions, settings, policies, and procedures.  Patient's individual exercise prescription and treatment plan were reviewed.  All starting workloads were established based on the results of the 6 minute walk test done at initial orientation visit.  The plan for exercise progression was also introduced and progression will be customized based on patient's performance and goals.    Dr. Emily Filbert is Medical Director for [facility].

## 2019-06-29 NOTE — Progress Notes (Signed)
Pulmonary Individual Treatment Plan  Patient Details  Name: Brandy [name] MRN: [id number] Date of Birth: 28-Oct-1965 Referring Provider:     Pulmonary Rehab from 04/06/2019 in [facility] Cardiac and Pulmonary Rehab  Referring Provider  Gollan      Initial Encounter Date:    Pulmonary Rehab from 04/06/2019 in [facility] Cardiac and Pulmonary Rehab  Date  04/06/19      Visit Diagnosis: Heart failure, diastolic, chronic ([place])  Dyspnea on exertion  Patient's Home Medications on Admission:  Current Outpatient Medications:  .  ARIPiprazole (ABILIFY) 5 MG tablet, Take 5 mg by mouth daily., Disp: , Rfl:  .  aspirin EC 81 MG tablet, Take 81 mg by mouth daily., Disp: , Rfl:  .  atorvastatin (LIPITOR) 10 MG tablet, TAKE 1 TABLET (10 MG TOTAL) BY MOUTH DAILY. FOR CHOLESTEROL., Disp: 90 tablet, Rfl: 0 .  celecoxib (CELEBREX) 100 MG capsule, Take 1 capsule (100 mg total) by mouth 2 (two) times daily. As needed for pain., Disp: 180 capsule, Rfl: 1 .  cetirizine (ZYRTEC) 10 MG tablet, TAKE 1 TABLET BY MOUTH EVERY DAY, Disp: 90 tablet, Rfl: 0 .  cholecalciferol (VITAMIN D3) 25 MCG (1000 UT) tablet, Take 1,000 Units by mouth daily., Disp: , Rfl:  .  citalopram (CELEXA) 20 MG tablet, Take 20 mg by mouth daily., Disp: , Rfl:  .  Continuous Blood Gluc Receiver (DEXCOM G6 RECEIVER) DEVI, Inject 1 Device into the skin daily., Disp: 4 each, Rfl: 1 .  Continuous Blood Gluc Sensor (DEXCOM G6 SENSOR) MISC, Inject 1 Device into the skin daily., Disp: 3 each, Rfl: 1 .  Continuous Blood Gluc Transmit (DEXCOM G6 TRANSMITTER) MISC, Inject 1 Device into the skin daily., Disp: 4 each, Rfl: 1 .  Cyanocobalamin (VITAMIN B-12 PO), Take 1 tablet by mouth daily., Disp: , Rfl:  .  DULoxetine (CYMBALTA) 30 MG capsule, Take 30 mg by mouth daily. , Disp: , Rfl:  .  DULoxetine (CYMBALTA) 60 MG capsule, Take 60 mg by mouth daily., Disp: , Rfl:  .  folic acid (FOLVITE) 1 MG tablet, Take 1 mg by mouth daily., Disp: , Rfl:  .  gabapentin  (NEURONTIN) 300 MG capsule, Take 300 mg by mouth 3 (three) times daily., Disp: , Rfl:  .  glipiZIDE (GLUCOTROL) 10 MG tablet, TAKE 1 TABLET (10 MG TOTAL) BY MOUTH 2 (TWO) TIMES DAILY BEFORE A MEAL. FOR DIABETES., Disp: 180 tablet, Rfl: 3 .  hydrocortisone 2.5 % cream, Apply topically 2 (two) times daily., Disp: , Rfl:  .  Insulin Glargine (BASAGLAR KWIKPEN) 100 UNIT/ML SOPN, Inject 0.35 mLs (35 Units total) into the skin at bedtime., Disp: 15 mL, Rfl: 2 .  Insulin Pen Needle (PEN NEEDLES) 31G X 6 MM MISC, Use nightly with insulin., Disp: 100 each, Rfl: 2 .  lisinopril-hydrochlorothiazide (ZESTORETIC) 20-12.5 MG tablet, TAKE 1 TABLET BY MOUTH DAILY FOR BLOOD PRESSURE, Disp: 90 tablet, Rfl: 1 .  metFORMIN (GLUCOPHAGE-XR) 500 MG 24 hr tablet, TAKE 2 TABLETS (1,000 MG TOTAL) BY MOUTH DAILY WITH BREAKFAST. FOR DIABETES., Disp: 180 tablet, Rfl: 1 .  metroNIDAZOLE (METROCREAM) 0.75 % cream, Apply 1 application topically daily., Disp: , Rfl:  .  Multiple Minerals-Vitamins (CALCIUM-MAGNESIUM-ZINC-D3 PO), Take 1 tablet by mouth daily., Disp: , Rfl:  .  traZODone (DESYREL) 50 MG tablet, Take 50 mg by mouth at bedtime as needed. Taking 1/2 tab daily, Disp: , Rfl:  .  Vitamin D, Ergocalciferol, (DRISDOL) 1.25 MG (50000 UT) CAPS capsule, TAKE 1 CAPSULE (50,000 UNITS  TOTAL) BY MOUTH EVERY 7 (SEVEN) DAYS., Disp: 12 capsule, Rfl: 1  Past Medical History: Past Medical History:  Diagnosis Date  . Anxiety   . Depression   . Diabetes mellitus without complication ([place])   . Dysmetabolic syndrome X   . Hypertension   . Hypertriglyceridemia   . Obesity   . Tetanus vaccine causing adverse effect in therapeutic use     Tobacco Use: Social History   Tobacco Use  Smoking Status Never Smoker  Smokeless Tobacco Never Used    Labs: Recent Review Flowsheet Data    Labs for ITP Cardiac and Pulmonary Rehab Latest Ref Rng & Units 07/15/2018 08/29/2018 12/10/2018 03/18/2019 06/17/2019   Cholestrol 0 - 200 mg/dL - - - 160  -   LDLCALC 0 - 99 mg/dL - - - 71 -   LDLDIRECT mg/dL - - - - -   HDL >39.00 mg/dL - - - 56.70 -   Trlycerides 0.0 - 149.0 mg/dL - - - 159.0(H) -   Hemoglobin A1c 4.0 - 5.6 % - 8.5(H) 8.9(A) 9.1(A) 9.2(A)   PHART 7.350 - 7.450 - - - - -   PCO2ART 32.0 - 48.0 mmHg - - - - -   HCO3 20.0 - 28.0 mmol/L - - - - -   ACIDBASEDEF 0.0 - 2.0 mmol/L - - - - -   O2SAT % 97.9 - - - -       Pulmonary Assessment Scores: Pulmonary Assessment Scores    Row Name 04/06/19 1704         ADL UCSD   SOB Score total  83     Rest  0     Walk  4     Stairs  5     Bath  4     Dress  1     Shop  5       CAT Score   CAT Score  18       mMRC Score   mMRC Score  3        UCSD: Self-administered rating of dyspnea associated with activities of daily living (ADLs) 6-point scale (0 = "not at all" to 5 = "maximal or unable to do because of breathlessness")  Scoring Scores range from 0 to 120.  Minimally important difference is 5 units  CAT: CAT can identify the health impairment of COPD patients and is better correlated with disease progression.  CAT has a scoring range of zero to 40. The CAT score is classified into four groups of low (less than 10), medium (10 - 20), high (21-30) and very high (31-40) based on the impact level of disease on health status. A CAT score over 10 suggests significant symptoms.  A worsening CAT score could be explained by an exacerbation, poor medication adherence, poor inhaler technique, or progression of COPD or comorbid conditions.  CAT MCID is 2 points  mMRC: mMRC (Modified Medical Research Council) Dyspnea Scale is used to assess the degree of baseline functional disability in patients of respiratory disease due to dyspnea. No minimal important difference is established. A decrease in score of 1 point or greater is considered a positive change.   Pulmonary Function Assessment:   Exercise Target Goals: Exercise Program Goal: Individual exercise prescription set  using results from initial 6 min walk test and THRR while considering  patient's activity barriers and safety.   Exercise Prescription Goal: Initial exercise prescription builds to 30-45 minutes a day of aerobic activity, 2-3 days  per week.  Home exercise guidelines will be given to patient during program as part of exercise prescription that the participant will acknowledge.  Activity Barriers & Risk Stratification: Activity Barriers & Cardiac Risk Stratification - 04/01/19 1413      Activity Barriers & Cardiac Risk Stratification   Activity Barriers  Arthritis;Deconditioning;Muscular Weakness;Shortness of Breath;Fibromyalgia    Comments  chronic fatigue, neuropathy.    Cardiac Risk Stratification  High       6 Minute Walk: 6 Minute Walk    Row Name 04/06/19 1659         6 Minute Walk   Phase  Initial     Distance  475 feet     Walk Time  4 minutes     # of Rest Breaks  2     MPH  1.34     METS  1     RPE  14     Perceived Dyspnea   3     VO2 Peak  3.4     Symptoms  Yes (comment)     Comments  back and leg pain     Resting HR  99 bpm     Resting BP  154/98     Resting Oxygen Saturation   96 %     Exercise Oxygen Saturation  during 6 min walk  95 %     Max Ex. HR  145 bpm     Max Ex. BP  168/102       Oxygen Initial Assessment: Oxygen Initial Assessment - 04/01/19 1412      Home Oxygen   Home Oxygen Device  None    Sleep Oxygen Prescription  None    Home Exercise Oxygen Prescription  None    Home at Rest Exercise Oxygen Prescription  None      Initial 6 min Walk   Oxygen Used  None      Program Oxygen Prescription   Program Oxygen Prescription  None      Intervention   Short Term Goals  To learn and understand importance of monitoring SPO2 with pulse oximeter and demonstrate accurate use of the pulse oximeter.;To learn and understand importance of maintaining oxygen saturations>88%;To learn and demonstrate proper pursed lip breathing techniques or other  breathing techniques.    Long  Term Goals  Verbalizes importance of monitoring SPO2 with pulse oximeter and return demonstration;Maintenance of O2 saturations>88%;Exhibits proper breathing techniques, such as pursed lip breathing or other method taught during program session       Oxygen Re-Evaluation: Oxygen Re-Evaluation    Row Name 06/29/19 0951             Program Oxygen Prescription   Program Oxygen Prescription  None         Home Oxygen   Home Oxygen Device  None       Sleep Oxygen Prescription  None       Home Exercise Oxygen Prescription  None       Compliance with Home Oxygen Use  Yes         Goals/Expected Outcomes   Short Term Goals  To learn and demonstrate proper pursed lip breathing techniques or other breathing techniques.       Long  Term Goals  Exhibits proper breathing techniques, such as pursed lip breathing or other method taught during program session       Comments  Reviewed PLB technique with pt.  Talked about how it works and it's importance  in maintaining their exercise saturations.       Goals/Expected Outcomes  Short: Become more profiecient at using PLB.   Long: Become independent at using PLB.          Oxygen Discharge (Final Oxygen Re-Evaluation): Oxygen Re-Evaluation - 06/29/19 0951      Program Oxygen Prescription   Program Oxygen Prescription  None      Home Oxygen   Home Oxygen Device  None    Sleep Oxygen Prescription  None    Home Exercise Oxygen Prescription  None    Compliance with Home Oxygen Use  Yes      Goals/Expected Outcomes   Short Term Goals  To learn and demonstrate proper pursed lip breathing techniques or other breathing techniques.    Long  Term Goals  Exhibits proper breathing techniques, such as pursed lip breathing or other method taught during program session    Comments  Reviewed PLB technique with pt.  Talked about how it works and it's importance in maintaining their exercise saturations.    Goals/Expected  Outcomes  Short: Become more profiecient at using PLB.   Long: Become independent at using PLB.       Initial Exercise Prescription: Initial Exercise Prescription - 04/06/19 1700      Date of Initial Exercise RX and Referring Provider   Date  04/06/19    Referring Provider  Gollan      Treadmill   MPH  1    Grade  0    Minutes  15   rest as needed   METs  1      NuStep   Level  1    SPM  80    Minutes  15    METs  1      Arm Ergometer   Level  1    RPM  25    Minutes  15    METs  1      Biostep-RELP   Level  1    SPM  50    Minutes  15      Prescription Details   Frequency (times per week)  3    Duration  Progress to 30 minutes of continuous aerobic without signs/symptoms of physical distress      Intensity   THRR 40-80% of Max Heartrate  121-150    Ratings of Perceived Exertion  11-15    Perceived Dyspnea  0-4      Resistance Training   Training Prescription  Yes    Weight  3 lb    Reps  10-15       Perform Capillary Blood Glucose checks as needed.  Exercise Prescription Changes: Exercise Prescription Changes    Row Name 04/06/19 1700 05/06/19 1100           Response to Exercise   Blood Pressure (Admit)  154/98  --      Blood Pressure (Exercise)  168/102  --      Blood Pressure (Exit)  132/84  --      Heart Rate (Admit)  99 bpm  --      Heart Rate (Exercise)  145 bpm  --      Heart Rate (Exit)  104 bpm  --      Oxygen Saturation (Admit)  96 %  --      Oxygen Saturation (Exercise)  95 %  --      Oxygen Saturation (Exit)  96 %  --  Rating of Perceived Exertion (Exercise)  14  --      Perceived Dyspnea (Exercise)  3  --      Symptoms  leg and back pain  --      Comments  --  Reviewed Virtual Program      Duration  --  Progress to 10 minutes continuous walking  at current work load and total walking time to 30-45 min      Intensity  --  THRR unchanged        Progression   Progression  --  Continue to progress workloads to maintain  intensity without signs/symptoms of physical distress.        Resistance Training   Training Prescription  --  Yes      Weight  --  3 lb, ROM, body weight, bands      Reps  --  10-15        Interval Training   Interval Training  --  No        Track   Minutes  --  10 at home along with [place] to continue exercise at  --  Home (comment) walking, videos      Frequency  --  Add 3 additional days to program exercise sessions.      Initial Home Exercises Provided  --  05/06/19         Exercise Comments: Exercise Comments    Row Name 06/29/19 [phone]           Exercise Comments  First full day of exercise!  Patient was oriented to gym and equipment including functions, settings, policies, and procedures.  Patient's individual exercise prescription and treatment plan were reviewed.  All starting workloads were established based on the results of the 6 minute walk test done at initial orientation visit.  The plan for exercise progression was also introduced and progression will be customized based on patient's performance and goals.          Exercise Goals and Review: Exercise Goals    Row Name 04/06/19 1715             Exercise Goals   Increase Physical Activity  Yes       Intervention  Provide advice, education, support and counseling about physical activity/exercise needs.;Develop an individualized exercise prescription for aerobic and resistive training based on initial evaluation findings, risk stratification, comorbidities and participant's personal goals.       Expected Outcomes  Short Term: Attend rehab on a regular basis to increase amount of physical activity.;Long Term: Add in home exercise to make exercise part of routine and to increase amount of physical activity.;Long Term: Exercising regularly at least 3-5 days a week.       Increase Strength and Stamina  Yes       Intervention  Provide advice, education, support and counseling about  physical activity/exercise needs.;Develop an individualized exercise prescription for aerobic and resistive training based on initial evaluation findings, risk stratification, comorbidities and participant's personal goals.       Expected Outcomes  Short Term: Increase workloads from initial exercise prescription for resistance, speed, and METs.;Short Term: Perform resistance training exercises routinely during rehab and add in resistance training at home;Long Term: Improve cardiorespiratory fitness, muscular endurance and strength as measured by increased METs and functional capacity (6MWT)       Able to understand and use rate of perceived exertion (RPE)  scale  Yes       Expected Outcomes  Short Term: Able to use RPE daily in rehab to express subjective intensity level;Long Term:  Able to use RPE to guide intensity level when exercising independently       Able to understand and use Dyspnea scale  Yes       Intervention  Provide education and explanation on how to use Dyspnea scale       Expected Outcomes  Short Term: Able to use Dyspnea scale daily in rehab to express subjective sense of shortness of breath during exertion;Long Term: Able to use Dyspnea scale to guide intensity level when exercising independently       Knowledge and understanding of Target Heart Rate Range (THRR)  Yes       Intervention  Provide education and explanation of THRR including how the numbers were predicted and where they are located for reference       Expected Outcomes  Short Term: Able to state/look up THRR;Short Term: Able to use daily as guideline for intensity in rehab;Long Term: Able to use THRR to govern intensity when exercising independently       Able to check pulse independently  Yes       Intervention  Provide education and demonstration on how to check pulse in carotid and radial arteries.;Review the importance of being able to check your own pulse for safety during independent exercise       Expected  Outcomes  Short Term: Able to explain why pulse checking is important during independent exercise;Long Term: Able to check pulse independently and accurately       Understanding of Exercise Prescription  Yes       Intervention  Provide education, explanation, and written materials on patient's individual exercise prescription       Expected Outcomes  Short Term: Able to explain program exercise prescription;Long Term: Able to explain home exercise prescription to exercise independently          Exercise Goals Re-Evaluation : Exercise Goals Re-Evaluation    Row Name 04/22/19 1247 05/06/19 1151 05/27/19 1205 06/24/19 1146 06/29/19 0948     Exercise Goal Re-Evaluation   Exercise Goals Review  --  Increase Physical Activity;Increase Strength and Stamina;Able to understand and use rate of perceived exertion (RPE) scale;Able to understand and use Dyspnea scale;Knowledge and understanding of Target Heart Rate Range (THRR);Able to check pulse independently;Understanding of Exercise Prescription  Increase Physical Activity;Increase Strength and Stamina;Understanding of Exercise Prescription  Increase Physical Activity;Increase Strength and Stamina;Understanding of Exercise Prescription  Able to understand and use rate of perceived exertion (RPE) scale;Able to understand and use Dyspnea scale;Knowledge and understanding of Target Heart Rate Range (THRR);Understanding of Exercise Prescription   Comments  Not starting until January  Reviewed home exercise with pt today.  Pt plans to walk and use staff videos at home for exercise.  Reviewed THR, pulse, RPE, sign and symptoms, and when to call 911 or MD.  Also discussed weather considerations and indoor options.  Pt voiced understanding.  Carleigh is doing okay at home.  She had a lot of questions about her exercise. She was still not sure how to start going.  We talked about downloading [employer] to help prompt her to exercise her three days a week and to record her  vitals.  Resent SMS for app. She is going to start with walking and/or staff videos.  We reviewed the order for videos (warm up, weights, cardio, stretch/cool down) and  walking.  She feels more confident with how to get started now.  She is doing well with her exercise and has a renewed sense of motivation to get in shape and get moving again. She has a new commitment to exercise and her overall wellbeing.  Reviewed RPE scale, THR and program prescription with pt today.  Pt voiced understanding and was given a copy of goals to take home.   Expected Outcomes  --  Short: Start to exercise at home for 10 min  Long: Work stamina up to 30  min of exercise.  Short: Start to exercise at home 10 min 3x week.  Long: Build up strength and stamina.  Short: Return to in-person rehab regularly.  Long: Continue to build up stamina.  Short: Use RPE daily to regulate intensity. Long: Follow program prescription in THR.      Discharge Exercise Prescription (Final Exercise Prescription Changes): Exercise Prescription Changes - 05/06/19 1100      Response to Exercise   Comments  Reviewed Virtual Program    Duration  Progress to 10 minutes continuous walking  at current work load and total walking time to 30-45 min    Intensity  THRR unchanged      Progression   Progression  Continue to progress workloads to maintain intensity without signs/symptoms of physical distress.      Resistance Training   Training Prescription  Yes    Weight  3 lb, ROM, body weight, bands    Reps  10-15      Interval Training   Interval Training  No      Track   Minutes  10   at home along with [place] to continue exercise at  Home (comment)   walking, videos   Frequency  Add 3 additional days to program exercise sessions.    Initial Home Exercises Provided  05/06/19       Nutrition:  Target Goals: Understanding of nutrition guidelines, daily intake of sodium <1572m, cholesterol <2037m  calories 30% from fat and 7% or less from saturated fats, daily to have 5 or more servings of fruits and vegetables.  Biometrics: Pre Biometrics - 04/06/19 1717      Pre Biometrics   Height  _0  (1.702 m)    Weight  (!) 374 lb 12.8 oz (170 kg)    BMI (Calculated)  58.69    Single Leg Stand  1.5 seconds        Nutrition Therapy Plan and Nutrition Goals: Nutrition Therapy & Goals - 05/04/19 1300      Nutrition Therapy   Diet  DM, HH, low Na    Drug/Food Interactions  Statins/Certain Fruits    Protein (specify units)  130g    Fiber  25 grams    Whole Grain Foods  3 servings    Saturated Fats  12 max. grams    Fruits and Vegetables  5 servings/day    Sodium  1.5 grams      Personal Nutrition Goals   Nutrition Goal  ST: add 1 fruit/vegetable serving LT: Would like to be under 165lb    Comments  Basaglar Long Lasting. Pt reports starting [email or web address] (lose 4% in six months). MyPlate method. 3 meals and 2-3 snacks. Pt reports wanting to meal prep. Discussed DM friendly eating, honoring hunger, expectations. Discussed some meal and snack ideas. will follow up in 2 weeks for more education and goals.  Intervention Plan   Intervention  Prescribe, educate and counsel regarding individualized specific dietary modifications aiming towards targeted core components such as weight, hypertension, lipid management, diabetes, heart failure and other comorbidities.;Nutrition handout(s) given to patient.    Expected Outcomes  Short Term Goal: Understand basic principles of dietary content, such as calories, fat, sodium, cholesterol and nutrients.;Short Term Goal: A plan has been developed with personal nutrition goals set during dietitian appointment.;Long Term Goal: Adherence to prescribed nutrition plan.       Nutrition Assessments:   Nutrition Goals Re-Evaluation: Nutrition Goals Re-Evaluation    Row Name 05/18/19 1302             Goals   Nutrition Goal  ST: continue to not drink pop   LT: Would like to be under 165lb       Comment  Able to add 1 F/V a day: strawberries, blueberries, bananas, maderines, cucumber, salads (3x/week), spinach. Pt reports energy levels are good. Pt reports she is emotionally drained; recent loss, friend with new breast cancer diagnosis, and son is a "handful". Pt reports having a good support system. Pt reports not drinking pop - discussed other options for drinks so she doesnt get bored (hibiscus tea). Pt reports lost 8 lbs, still has strength. Pt would like to continue with pop as a goal as it has been hard for her.       Expected Outcome  ST: continue to not drink pop  LT: Would like to be under 165lb          Nutrition Goals Discharge (Final Nutrition Goals Re-Evaluation): Nutrition Goals Re-Evaluation - 05/18/19 1302      Goals   Nutrition Goal  ST: continue to not drink pop  LT: Would like to be under 165lb    Comment  Able to add 1 F/V a day: strawberries, blueberries, bananas, maderines, cucumber, salads (3x/week), spinach. Pt reports energy levels are good. Pt reports she is emotionally drained; recent loss, friend with new breast cancer diagnosis, and son is a "handful". Pt reports having a good support system. Pt reports not drinking pop - discussed other options for drinks so she doesnt get bored (hibiscus tea). Pt reports lost 8 lbs, still has strength. Pt would like to continue with pop as a goal as it has been hard for her.    Expected Outcome  ST: continue to not drink pop  LT: Would like to be under 165lb       Psychosocial: Target Goals: Acknowledge presence or absence of significant depression and/or stress, maximize coping skills, provide positive support system. Participant is able to verbalize types and ability to use techniques and skills needed for reducing stress and depression.   Initial Review & Psychosocial Screening: Initial Psych Review & Screening - 04/01/19 1417      Initial Review   Current issues with  Current  Anxiety/Panic;Current Depression;Current Psychotropic Meds;Current Stress Concerns    Source of Stress Concerns  Chronic Illness;Poor Coping Skills;Family;Unable to participate in former interests or hobbies    Comments  Life is a major stressor.  Learning to cope with it best she can.  Son has autism and has a lot to manage. COVID.  Falling asleep just sitting there or even working at home during meetings. Has feelings of overwhelming fatigue and napping for 2-3 hours.  She has already had a couple of naps today. Trazadone is helping to sleep straight but makes it hard to get up.  Last night was a  rough night.      Family Dynamics   Good Support System?  Yes   Husband (caretaker), daughter helps out (single mom), youngest son lives with them.   Comments  Middle son has autism.      Barriers   Psychosocial barriers to participate in program  The patient should benefit from training in stress management and relaxation.;Psychosocial barriers identified (see note)      Screening Interventions   Interventions  Encouraged to exercise;Provide feedback about the scores to participant;To provide support and resources with identified psychosocial needs    Expected Outcomes  Short Term goal: Utilizing psychosocial counselor, staff and physician to assist with identification of specific Stressors or current issues interfering with healing process. Setting desired goal for each stressor or current issue identified.;Long Term Goal: Stressors or current issues are controlled or eliminated.;Short Term goal: Identification and review with participant of any Quality of Life or Depression concerns found by scoring the questionnaire.;Long Term goal: The participant improves quality of Life and PHQ9 Scores as seen by post scores and/or verbalization of changes       Quality of Life Scores:  Scores of 19 and below usually indicate a poorer quality of life in these areas.  A difference of  2-3 points is a clinically  meaningful difference.  A difference of 2-3 points in the total score of the Quality of Life Index has been associated with significant improvement in overall quality of life, self-image, physical symptoms, and general health in studies assessing change in quality of life.  PHQ-9: Recent Review Flowsheet Data    Depression screen [facility] 2/9 04/06/2019 03/30/2019 03/17/2018   Decreased Interest _0 Down, Depressed, Hopeless _1 PHQ - 2 Score _2 Altered sleeping _3 Tired, decreased energy _4 Change in appetite _5 Feeling bad or failure about yourself  _6 Trouble concentrating _7 Moving slowly or fidgety/restless _8 Suicidal thoughts 0 0 0   PHQ-9 Score _9 Difficult doing work/chores Extremely dIfficult Extremely dIfficult Not difficult at all     Interpretation of Total Score  Total Score Depression Severity:  1-4 = Minimal depression, 5-9 = Mild depression, 10-14 = Moderate depression, 15-19 = Moderately severe depression, 20-27 = Severe depression   Psychosocial Evaluation and Intervention: Psychosocial Evaluation - 04/01/19 1427      Psychosocial Evaluation & Interventions   Interventions  Stress management education;Encouraged to exercise with the program and follow exercise prescription    Comments  Alexy is coming to rehab for heart failure and SOB.  She wants to get stronger and improve her mobility and breathing.  She struggles with her sleeping and causes her fall asleep during the day and at work.  She has currently quit her job as she was not functioning.  She wants to be able to go and do with her kids and family.    Expected Outcomes  Short: Attend rehab regularly  Long: Improve overall stamina    Continue Psychosocial Services   Follow up required by staff       Psychosocial Re-Evaluation:   Psychosocial Discharge (Final Psychosocial Re-Evaluation):   Education: Education Goals: Education classes will be provided on a  weekly basis, covering required topics. Participant will state understanding/return demonstration of topics presented.  Learning Barriers/Preferences: Learning Barriers/Preferences -  04/01/19 1414      Learning Barriers/Preferences   Learning Barriers  Sight   glasses   Learning Preferences  Skilled Demonstration;Video       Education Topics:  Initial Evaluation Education: - Verbal, written and demonstration of respiratory meds, oximetry and breathing techniques. Instruction on use of nebulizers and MDIs and importance of monitoring MDI activations.   General Nutrition Guidelines/Fats and Fiber: -Group instruction provided by verbal, written material, models and posters to present the general guidelines for heart healthy nutrition. Gives an explanation and review of dietary fats and fiber.   Controlling Sodium/Reading Food Labels: -Group verbal and written material supporting the discussion of sodium use in heart healthy nutrition. Review and explanation with models, verbal and written materials for utilization of the food label.   Exercise Physiology & General Exercise Guidelines: - Group verbal and written instruction with models to review the exercise physiology of the cardiovascular system and associated critical values. Provides general exercise guidelines with specific guidelines to those with heart or lung disease.    Aerobic Exercise & Resistance Training: - Gives group verbal and written instruction on the various components of exercise. Focuses on aerobic and resistive training programs and the benefits of this training and how to safely progress through these programs.   Flexibility, Balance, Mind/Body Relaxation: Provides group verbal/written instruction on the benefits of flexibility and balance training, including mind/body exercise modes such as yoga, pilates and tai chi.  Demonstration and skill practice provided.   Stress and Anxiety: - Provides group verbal  and written instruction about the health risks of elevated stress and causes of high stress.  Discuss the correlation between heart/lung disease and anxiety and treatment options. Review healthy ways to manage with stress and anxiety.   Depression: - Provides group verbal and written instruction on the correlation between heart/lung disease and depressed mood, treatment options, and the stigmas associated with seeking treatment.   Exercise & Equipment Safety: - Individual verbal instruction and demonstration of equipment use and safety with use of the equipment.   Pulmonary Rehab from 04/06/2019 in [facility] Cardiac and Pulmonary Rehab  Date  04/06/19  Educator  AS  Instruction Review Code  1- Verbalizes Understanding      Infection Prevention: - Provides verbal and written material to individual with discussion of infection control including proper hand washing and proper equipment cleaning during exercise session.   Pulmonary Rehab from 04/06/2019 in [facility] Cardiac and Pulmonary Rehab  Date  04/06/19  Educator  AS  Instruction Review Code  1- Verbalizes Understanding      Falls Prevention: - Provides verbal and written material to individual with discussion of falls prevention and safety.   Pulmonary Rehab from 04/06/2019 in [facility] Cardiac and Pulmonary Rehab  Date  04/06/19  Educator  AS  Instruction Review Code  1- Verbalizes Understanding      Diabetes: - Individual verbal and written instruction to review signs/symptoms of diabetes, desired ranges of glucose level fasting, after meals and with exercise. Advice that pre and post exercise glucose checks will be done for 3 sessions at entry of program.   Chronic Lung Diseases: - Group verbal and written instruction to review updates, respiratory medications, advancements in procedures and treatments. Discuss use of supplemental oxygen including available portable oxygen systems, continuous and intermittent flow rates, concentrators,  personal use and safety guidelines. Review proper use of inhaler and spacers. Provide informative websites for self-education.    Energy Conservation: - Provide group verbal and written instruction  for methods to conserve energy, plan and organize activities. Instruct on pacing techniques, use of adaptive equipment and posture/positioning to relieve shortness of breath.   Triggers and Exacerbations: - Group verbal and written instruction to review types of environmental triggers and ways to prevent exacerbations. Discuss weather changes, air quality and the benefits of nasal washing. Review warning signs and symptoms to help prevent infections. Discuss techniques for effective airway clearance, coughing, and vibrations.   AED/CPR: - Group verbal and written instruction with the use of models to demonstrate the basic use of the AED with the basic ABC's of resuscitation.   Anatomy and Physiology of the Lungs: - Group verbal and written instruction with the use of models to provide basic lung anatomy and physiology related to function, structure and complications of lung disease.   Anatomy & Physiology of the Heart: - Group verbal and written instruction and models provide basic cardiac anatomy and physiology, with the coronary electrical and arterial systems. Review of Valvular disease and Heart Failure   Cardiac Medications: - Group verbal and written instruction to review commonly prescribed medications for heart disease. Reviews the medication, class of the drug, and side effects.   Know Your Numbers and Risk Factors: -Group verbal and written instruction about important numbers in your health.  Discussion of what are risk factors and how they play a role in the disease process.  Review of Cholesterol, Blood Pressure, Diabetes, and BMI and the role they play in your overall health.   Sleep Hygiene: -Provides group verbal and written instruction about how sleep can affect your health.   Define sleep hygiene, discuss sleep cycles and impact of sleep habits. Review good sleep hygiene tips.    Other: -Provides group and verbal instruction on various topics (see comments)    Knowledge Questionnaire Score: Knowledge Questionnaire Score - 04/06/19 1711      Knowledge Questionnaire Score   Pre Score  16/18        Core Components/Risk Factors/Patient Goals at Admission: Personal Goals and Risk Factors at Admission - 04/06/19 1714      Core Components/Risk Factors/Patient Goals on Admission    Weight Management  Yes    Intervention  Weight Management/Obesity: Establish reasonable short term and long term weight goals.    Admit Weight  374 lb 12.8 oz (170 kg)    Goal Weight: Short Term  365 lb (165.6 kg)    Goal Weight: Long Term  355 lb (161 kg)    Expected Outcomes  Short Term: Continue to assess and modify interventions until short term weight is achieved;Long Term: Adherence to nutrition and physical activity/exercise program aimed toward attainment of established weight goal;Weight Maintenance: Understanding of the daily nutrition guidelines, which includes 25-35% calories from fat, 7% or less cal from saturated fats, less than '200mg'$  cholesterol, less than 1.5gm of sodium, & 5 or more servings of fruits and vegetables daily;Weight Loss: Understanding of general recommendations for a balanced deficit meal plan, which promotes 1-2 lb weight loss per week and includes a negative energy balance of [phone] kcal/d;Understanding recommendations for meals to include 15-35% energy as protein, 25-35% energy from fat, 35-60% energy from carbohydrates, less than '200mg'$  of dietary cholesterol, 20-35 gm of total fiber daily;Understanding of distribution of calorie intake throughout the day with the consumption of 4-5 meals/snacks;Weight Gain: Understanding of general recommendations for a high calorie, high protein meal plan that promotes weight gain by distributing calorie intake throughout  the day with the consumption for 4-5 meals,  snacks, and/or supplements       Core Components/Risk Factors/Patient Goals Review:  Goals and Risk Factor Review    Row Name 05/27/19 1205 06/24/19 1147           Core Components/Risk Factors/Patient Goals Review   Personal Goals Review  Weight Management/Obesity;Hypertension;Heart Failure  Weight Management/Obesity;Hypertension;Heart Failure      Review  She was discouraged today as her weight was up 2 lb and she was feeling more SOB than normal.  We talked about possible causes and she admitted to doing more at home the last few days (more than her normal) and thinks she has overdone it.  We talked about DOMS and how it can make you feel worse for a few days but it will improve.  This is also a symptoms of her heart failure so we talked about combatting that as well.  She is going to give it another day to see if she improves and move a little more today to help release soreness and get fluid moving out.  She has not been checking her blood pressures routinely but will start to get into that habit since the app will remind her to track it.  She is eager to get moving and to start feeling better again.  Her weight has been going up and down and she wants to lose the weight for good.  She is now committed to coming to in person rehab.      Expected Outcomes  Short: Watch weight closely with heart failure.  Long: Manage heart failure  Short: Work on weight loss.  Long: Continue to manage heart failure.         Core Components/Risk Factors/Patient Goals at Discharge (Final Review):  Goals and Risk Factor Review - 06/24/19 1147      Core Components/Risk Factors/Patient Goals Review   Personal Goals Review  Weight Management/Obesity;Hypertension;Heart Failure    Review  Her weight has been going up and down and she wants to lose the weight for good.  She is now committed to coming to in person rehab.    Expected Outcomes  Short: Work on weight loss.   Long: Continue to manage heart failure.       ITP Comments: ITP Comments    Row Name 04/01/19 1440 04/06/19 1718 04/14/19 1420 04/22/19 1246 04/29/19 0927   ITP Comments  Completed virtual orientation today.  Documentation for diagnosis can be found in [facility] encounter 11/17.  EP eval scheduled for 12/7 at 3pm.  Completed 6MWT.  Initital ITP created and sent to Dr Dortha Schwalbe called to day and wants to wait until after the first of the year to reconsider the program.  She is concerned about the number of people at the main entrance to the hospital, valet, etc.  She felt very safe in the Rehab area.  Brycelynn will be out until after the new year.  30 day review competed . ITP sent to Dr Emily Filbert for review, changes as needed and ITP approval signature   [place] Name 05/04/19 1340 05/06/19 1148 05/18/19 1335 05/27/19 1205 06/24/19 1146   ITP Comments  Completed initial RD eval  Completed virtual appt today.  Reviewed Virtual Home Exercise Guidelines and program.  Kirstyn wants to do the fully virtual program while COVID-19 is still high as she is not comfortable coming into the hospital at this time.  Once things calm down, she would like to do the in person program.  Enrolled in [employer].  Completed 2 week f/u  Completed virtual visit today.  Next follow up scheduled with Melissa on 2/1 and me on 2/10.  She would like to see if she can change to a Thurs with Melissa so it's not as long between calls.  Completed virtual follow up today.  Concetta is ready to return to in person rehab.  She is scheduled to restart next week.  We will do a walk test and new ITP on her first visit.   [place] Name 06/29/19 0948           ITP Comments  First full day of exercise!  Patient was oriented to gym and equipment including functions, settings, policies, and procedures.  Patient's individual exercise prescription and treatment plan were reviewed.  All starting workloads were established based on the results of the 6 minute walk  test done at initial orientation visit.  The plan for exercise progression was also introduced and progression will be customized based on patient's performance and goals.          Comments: initial ITP

## 2019-06-30 ENCOUNTER — Ambulatory Visit: Payer: BC Managed Care – PPO

## 2019-07-01 ENCOUNTER — Encounter: Payer: BC Managed Care – PPO | Admitting: *Deleted

## 2019-07-01 ENCOUNTER — Ambulatory Visit: Payer: BC Managed Care – PPO

## 2019-07-01 ENCOUNTER — Other Ambulatory Visit: Payer: Self-pay

## 2019-07-01 DIAGNOSIS — R0609 Other forms of dyspnea: Secondary | ICD-10-CM

## 2019-07-01 DIAGNOSIS — R0600 Dyspnea, unspecified: Secondary | ICD-10-CM | POA: Diagnosis not present

## 2019-07-01 DIAGNOSIS — Z6841 Body Mass Index (BMI) 40.0 and over, adult: Secondary | ICD-10-CM | POA: Diagnosis not present

## 2019-07-01 DIAGNOSIS — I509 Heart failure, unspecified: Secondary | ICD-10-CM | POA: Diagnosis not present

## 2019-07-01 DIAGNOSIS — I872 Venous insufficiency (chronic) (peripheral): Secondary | ICD-10-CM | POA: Diagnosis not present

## 2019-07-01 DIAGNOSIS — I5032 Chronic diastolic (congestive) heart failure: Secondary | ICD-10-CM

## 2019-07-01 DIAGNOSIS — E1159 Type 2 diabetes mellitus with other circulatory complications: Secondary | ICD-10-CM | POA: Diagnosis not present

## 2019-07-01 DIAGNOSIS — Z713 Dietary counseling and surveillance: Secondary | ICD-10-CM | POA: Diagnosis not present

## 2019-07-01 LAB — GLUCOSE, CAPILLARY
Glucose-Capillary: 180 mg/dL — ABNORMAL HIGH (ref 70–99)
Glucose-Capillary: 183 mg/dL — ABNORMAL HIGH (ref 70–99)

## 2019-07-01 NOTE — Progress Notes (Signed)
Daily Session Note  Patient Details  Name: Brandy [name] MRN: [id number] Date of Birth: 1966/03/19 Referring Provider:     Pulmonary Rehab from 04/06/2019 in [facility] Cardiac and Pulmonary Rehab  Referring Provider  Gollan      Encounter Date: 07/01/2019  Check In: Session Check In - 07/01/19 0959      Check-In   Supervising physician immediately available to respond to emergencies  See telemetry face sheet for immediately available ER MD    Location  [facility]-Cardiac & Pulmonary Rehab    Staff Present  Heath Lark, RN, BSN, CCRP;Meredith Sherryll Burger, RN BSN;Joseph Hood RCP,RRT,BSRT;Jeanna Durrell BS, Exercise Physiologist    Virtual Visit  No    Medication changes reported      No    Fall or balance concerns reported     No    Warm-up and Cool-down  Performed on first and last piece of equipment    Resistance Training Performed  Yes    VAD Patient?  No    PAD/SET Patient?  No      Pain Assessment   Currently in Pain?  No/denies          Social History   Tobacco Use  Smoking Status Never Smoker  Smokeless Tobacco Never Used    Goals Met:  Proper associated with RPD/PD & O2 Sat Exercise tolerated well No report of cardiac concerns or symptoms  Goals Unmet:  Not Applicable  Comments: Pt able to follow exercise prescription today without complaint.  Will continue to monitor for progression.    Dr. Emily Filbert is Medical Director for [facility].

## 2019-07-02 ENCOUNTER — Ambulatory Visit: Payer: BC Managed Care – PPO

## 2019-07-02 ENCOUNTER — Ambulatory Visit (INDEPENDENT_AMBULATORY_CARE_PROVIDER_SITE_OTHER): Payer: BC Managed Care – PPO | Admitting: Psychology

## 2019-07-02 ENCOUNTER — Other Ambulatory Visit (INDEPENDENT_AMBULATORY_CARE_PROVIDER_SITE_OTHER): Payer: BC Managed Care – PPO

## 2019-07-02 DIAGNOSIS — R1030 Lower abdominal pain, unspecified: Secondary | ICD-10-CM

## 2019-07-02 DIAGNOSIS — F3289 Other specified depressive episodes: Secondary | ICD-10-CM

## 2019-07-02 DIAGNOSIS — N3000 Acute cystitis without hematuria: Secondary | ICD-10-CM | POA: Diagnosis not present

## 2019-07-02 LAB — POC URINALSYSI DIPSTICK (AUTOMATED)
Bilirubin, UA: NEGATIVE
Blood, UA: NEGATIVE
Glucose, UA: NEGATIVE
Ketones, UA: NEGATIVE
Leukocytes, UA: NEGATIVE
Nitrite, UA: NEGATIVE
Protein, UA: NEGATIVE
Spec Grav, UA: 1.015 (ref 1.010–1.025)
Urobilinogen, UA: 0.2 E.U./dL
pH, UA: 6 (ref 5.0–8.0)

## 2019-07-03 DIAGNOSIS — M545 Low back pain: Secondary | ICD-10-CM | POA: Diagnosis not present

## 2019-07-03 DIAGNOSIS — M79604 Pain in right leg: Secondary | ICD-10-CM | POA: Diagnosis not present

## 2019-07-03 DIAGNOSIS — M79605 Pain in left leg: Secondary | ICD-10-CM | POA: Diagnosis not present

## 2019-07-03 DIAGNOSIS — M25512 Pain in left shoulder: Secondary | ICD-10-CM | POA: Diagnosis not present

## 2019-07-04 ENCOUNTER — Other Ambulatory Visit: Payer: Self-pay | Admitting: Primary Care

## 2019-07-04 DIAGNOSIS — N3000 Acute cystitis without hematuria: Secondary | ICD-10-CM

## 2019-07-04 LAB — URINE CULTURE
MICRO NUMBER:: 10214899
SPECIMEN QUALITY:: ADEQUATE

## 2019-07-04 MED ORDER — SULFAMETHOXAZOLE-TRIMETHOPRIM 800-160 MG PO TABS: 1.0000 | ORAL_TABLET | Freq: Two times a day (BID) | ORAL | 0 refills | Status: DC

## 2019-07-06 ENCOUNTER — Other Ambulatory Visit: Payer: Self-pay | Admitting: Primary Care

## 2019-07-06 ENCOUNTER — Other Ambulatory Visit: Payer: Self-pay

## 2019-07-06 ENCOUNTER — Encounter: Payer: BC Managed Care – PPO | Admitting: *Deleted

## 2019-07-06 DIAGNOSIS — Z6841 Body Mass Index (BMI) 40.0 and over, adult: Secondary | ICD-10-CM | POA: Diagnosis not present

## 2019-07-06 DIAGNOSIS — I509 Heart failure, unspecified: Secondary | ICD-10-CM | POA: Diagnosis not present

## 2019-07-06 DIAGNOSIS — Z1231 Encounter for screening mammogram for malignant neoplasm of breast: Secondary | ICD-10-CM

## 2019-07-06 DIAGNOSIS — R0609 Other forms of dyspnea: Secondary | ICD-10-CM

## 2019-07-06 DIAGNOSIS — Z713 Dietary counseling and surveillance: Secondary | ICD-10-CM | POA: Diagnosis not present

## 2019-07-06 DIAGNOSIS — R0600 Dyspnea, unspecified: Secondary | ICD-10-CM | POA: Diagnosis not present

## 2019-07-06 DIAGNOSIS — I872 Venous insufficiency (chronic) (peripheral): Secondary | ICD-10-CM | POA: Diagnosis not present

## 2019-07-06 DIAGNOSIS — E1159 Type 2 diabetes mellitus with other circulatory complications: Secondary | ICD-10-CM | POA: Diagnosis not present

## 2019-07-06 DIAGNOSIS — I5032 Chronic diastolic (congestive) heart failure: Secondary | ICD-10-CM

## 2019-07-06 LAB — GLUCOSE, CAPILLARY
Glucose-Capillary: 207 mg/dL — ABNORMAL HIGH (ref 70–99)
Glucose-Capillary: 157 mg/dL — ABNORMAL HIGH (ref 70–99)

## 2019-07-06 NOTE — Progress Notes (Signed)
Daily Session Note  Patient Details  Name: Brandy [name] MRN: [id number] Date of Birth: Oct 04, 1965 Referring Provider:     Pulmonary Rehab from 04/06/2019 in [facility] Cardiac and Pulmonary Rehab  Referring Provider  Gollan      Encounter Date: 07/06/2019  Check In: Session Check In - 07/06/19 [place]      Check-In   Supervising physician immediately available to respond to emergencies  See telemetry face sheet for immediately available ER MD    Location  [facility]-Cardiac & Pulmonary Rehab    Staff Present  Renita Papa, RN BSN;Joseph [address] [place], [place], ACSM CEP, Exercise Physiologist    Virtual Visit  No    Medication changes reported      No    Fall or balance concerns reported     No    Warm-up and Cool-down  Performed on first and last piece of equipment    Resistance Training Performed  Yes    VAD Patient?  No    PAD/SET Patient?  No      Pain Assessment   Currently in Pain?  No/denies          Social History   Tobacco Use  Smoking Status Never Smoker  Smokeless Tobacco Never Used    Goals Met:  Independence with exercise equipment Using PLB without cueing & demonstrates good technique Exercise tolerated well No report of cardiac concerns or symptoms Strength training completed today  Goals Unmet:  Not Applicable  Comments: Pt able to follow exercise prescription today without complaint.  Will continue to monitor for progression.    Dr. Emily Filbert is Medical Director for [facility].

## 2019-07-07 ENCOUNTER — Ambulatory Visit: Payer: BC Managed Care – PPO

## 2019-07-07 ENCOUNTER — Other Ambulatory Visit: Payer: Self-pay

## 2019-07-07 ENCOUNTER — Ambulatory Visit (INDEPENDENT_AMBULATORY_CARE_PROVIDER_SITE_OTHER): Payer: BC Managed Care – PPO | Admitting: Primary Care

## 2019-07-07 DIAGNOSIS — Z86018 Personal history of other benign neoplasm: Secondary | ICD-10-CM | POA: Insufficient documentation

## 2019-07-07 DIAGNOSIS — M79604 Pain in right leg: Secondary | ICD-10-CM | POA: Diagnosis not present

## 2019-07-07 DIAGNOSIS — N950 Postmenopausal bleeding: Secondary | ICD-10-CM

## 2019-07-07 DIAGNOSIS — M79605 Pain in left leg: Secondary | ICD-10-CM | POA: Diagnosis not present

## 2019-07-07 DIAGNOSIS — M545 Low back pain: Secondary | ICD-10-CM | POA: Diagnosis not present

## 2019-07-07 DIAGNOSIS — M25512 Pain in left shoulder: Secondary | ICD-10-CM | POA: Diagnosis not present

## 2019-07-07 NOTE — Patient Instructions (Signed)
You will be contacted regarding your referral to GYN.  Please let us know if you have not been contacted within two weeks.   Come to the office at your convenience for the xray.   It was a pleasure to see you today! Mayra Reel, NP-C

## 2019-07-07 NOTE — Assessment & Plan Note (Signed)
Xray of tib/fib pending.

## 2019-07-07 NOTE — Progress Notes (Signed)
Subjective:    Patient ID: Brandy [name], female    DOB: 04-Sep-1965, 54 y.o.   MRN: [id number]  HPI  Virtual Visit via Video Note  I connected with Freddy Spadafora Seivert on 07/07/19 at  2:00 PM EST by a video enabled telemedicine application and verified that I am speaking with the correct person using two identifiers.  Location: Patient: Home Provider: Office   I discussed the limitations of evaluation and management by telemedicine and the availability of in person appointments. The patient expressed understanding and agreed to proceed.  History of Present Illness:  [name] is a 54 year old female with a history of hypertension, chronic venous insuffiencey, type 2 diabetes, chronic bilateral lower back pain, hyperlipidemia, chronic pain, postmenopause who presents today with a chief complaint of vaginal bleeding. She would also like to mention a history of bone tumor.  1) Vaginal Bleeding: Chronic and intermittent history that occurs when she lifts anything heavy, at least this is what she always thought.   History of irregular menstrual periods for years. Starting in 2019 she started noticing larger gaps for menstrual periods. She had no menstrual period from February 2019 through October 2019. She had a period in November 2019, next one was January 2020. Her next episode of vaginal bleeding was in early February 2021 which lasted 5 days. She lifted a rowing machine the day prior. She had a another bleeding episode that began 06/23/19 and lasted 5-6 days, felt like a typical period.  She denies a history of uterine fibroids. She has not seen GYN or had a pap smear since 2015. She is on treatment for acute cystitis.   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 She's had no problem to the site of removal or had any follow up to the site since the surgery during childhood. Since working with PT and  pulmonary rehab she's noticed pain to the site where the bone was removed. She has chronic neuropathy so she's not sure if the pain to the site is radiating.   Observations/Objective:  Alert and oriented. Appears well, not sickly. No distress. Speaking in complete sentences.   Assessment and Plan:  See problem based charting.   Follow Up Instructions:  You will be contacted regarding your referral to GYN.  Please let us know if you have not been contacted within two weeks.   Come to the office at your convenience for the xray.   It was a pleasure to see you today! Allie Bossier, NP-C    I discussed the assessment and treatment plan with the patient. The patient was provided an opportunity to ask questions and all were answered. The patient agreed with the plan and demonstrated an understanding of the instructions.   The patient was advised to call back or seek an in-person evaluation if the symptoms worsen or if the condition fails to improve as anticipated.   Pleas Koch, NP    Review of Systems  Genitourinary: Positive for vaginal bleeding.       See HPI  Musculoskeletal: Positive for arthralgias.  Skin: Negative for color change.       Past Medical History:  Diagnosis Date  . Anxiety   . Depression   . Diabetes mellitus without complication ([place])   . Dysmetabolic syndrome X   . Hypertension   . Hypertriglyceridemia   . Obesity   .  Tetanus vaccine causing adverse effect in therapeutic use      Social History   Socioeconomic History  . Marital status: Married    Spouse name: Not on file  . Number of children: 3  . Years of education: Not on file  . Highest education level: Not on file  Occupational History  . Occupation: Government social research officer: [employer]  Tobacco Use  . Smoking status: Never Smoker  . Smokeless tobacco: Never Used  Substance and Sexual Activity  . Alcohol use: Yes    Comment: rarely - once a year  . Drug use: No  .  Sexual activity: Not on file  Other Topics Concern  . Not on file  Social History Narrative   Best boy      Married; 3 kids (middle child-severely autistic)      No regular exercise   Social Determinants of Health   Financial Resource Strain:   . Difficulty of Paying Living Expenses: Not on file  Food Insecurity:   . Worried About Programme researcher, broadcasting/film/video in the Last Year: Not on file  . Ran Out of Food in the Last Year: Not on file  Transportation Needs:   . Lack of Transportation (Medical): Not on file  . Lack of Transportation (Non-Medical): Not on file  Physical Activity:   . Days of Exercise per Week: Not on file  . Minutes of Exercise per Session: Not on file  Stress:   . Feeling of Stress : Not on file  Social Connections:   . Frequency of Communication with Friends and Family: Not on file  . Frequency of Social Gatherings with Friends and Family: Not on file  . Attends Religious Services: Not on file  . Active Member of Clubs or Organizations: Not on file  . Attends Banker Meetings: Not on file  . Marital Status: Not on file  Intimate Partner Violence:   . Fear of Current or Ex-Partner: Not on file  . Emotionally Abused: Not on file  . Physically Abused: Not on file  . Sexually Abused: Not on file    Past Surgical History:  Procedure Laterality Date  . 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 Rheum arthritis Mother   . COPD Mother   . Hypertension Father   . Heart disease Maternal Grandmother   . Stroke Maternal Grandmother   . Alcohol abuse Maternal Grandmother   . Alcohol abuse Maternal Grandfather   . Aneurysm Paternal Grandmother   . Autism Son        severe  . Diabetes Paternal Aunt     Allergies  Allergen Reactions  . Diphenhydramine Hcl     REACTION: minor rash  . Penicillins     REACTION: reaction as a child    Current Outpatient Medications on File Prior to Visit    Medication Sig Dispense Refill  . ARIPiprazole (ABILIFY) 5 MG tablet Take 5 mg by mouth daily.    Marland Kitchen aspirin EC 81 MG tablet Take 81 mg by mouth daily.    Marland Kitchen atorvastatin (LIPITOR) 10 MG tablet TAKE 1 TABLET (10 MG TOTAL) BY MOUTH DAILY. FOR CHOLESTEROL. 90 tablet 0  . celecoxib (CELEBREX) 100 MG capsule Take 1 capsule (100 mg total) by mouth 2 (two) times daily. As needed for pain. 180 capsule 1  . cetirizine (ZYRTEC) 10 MG tablet TAKE 1 TABLET BY MOUTH  EVERY DAY 90 tablet 0  . cholecalciferol (VITAMIN D3) 25 MCG (1000 UT) tablet Take 1,000 Units by mouth daily.    . citalopram (CELEXA) 20 MG tablet Take 20 mg by mouth daily.    . Continuous Blood Gluc Receiver (DEXCOM G6 RECEIVER) DEVI Inject 1 Device into the skin daily. 4 each 1  . Continuous Blood Gluc Sensor (DEXCOM G6 SENSOR) MISC Inject 1 Device into the skin daily. 3 each 1  . Continuous Blood Gluc Transmit (DEXCOM G6 TRANSMITTER) MISC Inject 1 Device into the skin daily. 4 each 1  . Cyanocobalamin (VITAMIN B-12 PO) Take 1 tablet by mouth daily.    . DULoxetine (CYMBALTA) 30 MG capsule Take 30 mg by mouth daily.     . DULoxetine (CYMBALTA) 60 MG capsule Take 60 mg by mouth daily.    . folic acid (FOLVITE) 1 MG tablet Take 1 mg by mouth daily.    Marland Kitchen gabapentin (NEURONTIN) 300 MG capsule Take 300 mg by mouth 3 (three) times daily.    Marland Kitchen glipiZIDE (GLUCOTROL) 10 MG tablet TAKE 1 TABLET (10 MG TOTAL) BY MOUTH 2 (TWO) TIMES DAILY BEFORE A MEAL. FOR DIABETES. 180 tablet 3  . hydrocortisone 2.5 % cream Apply topically 2 (two) times daily.    . Insulin Glargine (BASAGLAR KWIKPEN) 100 UNIT/ML SOPN Inject 0.35 mLs (35 Units total) into the skin at bedtime. 15 mL 2  . Insulin Pen Needle (PEN NEEDLES) 31G X 6 MM MISC Use nightly with insulin. 100 each 2  . lisinopril-hydrochlorothiazide (ZESTORETIC) 20-12.5 MG tablet TAKE 1 TABLET BY MOUTH DAILY FOR BLOOD PRESSURE 90 tablet 1  . metFORMIN (GLUCOPHAGE-XR) 500 MG 24 hr tablet TAKE 2 TABLETS (1,000 MG  TOTAL) BY MOUTH DAILY WITH BREAKFAST. FOR DIABETES. 180 tablet 1  . metroNIDAZOLE (METROCREAM) 0.75 % cream Apply 1 application topically daily.    . Multiple Minerals-Vitamins (CALCIUM-MAGNESIUM-ZINC-D3 PO) Take 1 tablet by mouth daily.    Marland Kitchen sulfamethoxazole-trimethoprim (BACTRIM DS) 800-160 MG tablet Take 1 tablet by mouth 2 (two) times daily. For urinary tract infection. 10 tablet 0  . traZODone (DESYREL) 50 MG tablet Take 50 mg by mouth at bedtime as needed. Taking 1/2 tab daily    . Vitamin D, Ergocalciferol, (DRISDOL) 1.25 MG (50000 UT) CAPS capsule TAKE 1 CAPSULE (50,000 UNITS TOTAL) BY MOUTH EVERY 7 (SEVEN) DAYS. 12 capsule 1   No current facility-administered medications on file prior to visit.    LMP  (LMP Unknown)    Objective:   Physical Exam  Constitutional: She is oriented to person, place, and time. She appears well-nourished.  Respiratory: Effort normal.  Neurological: She is alert and oriented to person, place, and time.  Psychiatric: She has a normal mood and affect.           Assessment & Plan:

## 2019-07-07 NOTE — Assessment & Plan Note (Signed)
Two episodes of post menopausal bleeding in February 2021. LMP was in January 2020.  Referral to GYN placed.

## 2019-07-08 ENCOUNTER — Ambulatory Visit: Payer: BC Managed Care – PPO

## 2019-07-08 ENCOUNTER — Other Ambulatory Visit: Payer: Self-pay

## 2019-07-08 ENCOUNTER — Encounter: Payer: BC Managed Care – PPO | Admitting: *Deleted

## 2019-07-08 DIAGNOSIS — E1159 Type 2 diabetes mellitus with other circulatory complications: Secondary | ICD-10-CM | POA: Diagnosis not present

## 2019-07-08 DIAGNOSIS — I509 Heart failure, unspecified: Secondary | ICD-10-CM | POA: Diagnosis not present

## 2019-07-08 DIAGNOSIS — I5032 Chronic diastolic (congestive) heart failure: Secondary | ICD-10-CM

## 2019-07-08 DIAGNOSIS — I872 Venous insufficiency (chronic) (peripheral): Secondary | ICD-10-CM | POA: Diagnosis not present

## 2019-07-08 DIAGNOSIS — R0600 Dyspnea, unspecified: Secondary | ICD-10-CM

## 2019-07-08 DIAGNOSIS — Z6841 Body Mass Index (BMI) 40.0 and over, adult: Secondary | ICD-10-CM | POA: Diagnosis not present

## 2019-07-08 DIAGNOSIS — R0609 Other forms of dyspnea: Secondary | ICD-10-CM

## 2019-07-08 DIAGNOSIS — Z713 Dietary counseling and surveillance: Secondary | ICD-10-CM | POA: Diagnosis not present

## 2019-07-08 NOTE — Progress Notes (Signed)
Daily Session Note  Patient Details  Name: Brandy [name] MRN: [id number] Date of Birth: 12-Aug-1965 Referring Provider:     Pulmonary Rehab from 04/06/2019 in [facility] Cardiac and Pulmonary Rehab  Referring Provider  Goll[place]      Encounter Date: 07/08/2019  Check In: Session Check In - 07/08/19 0949      Check-In   Supervising physician immediately available to respond to emergencies  See telemetry face sheet for immediately available ER MD    Location  [facility]-Cardiac & Pulmonary Rehab    Staff Present  Heath Lark, RN, BSN, CCRP;Meredith Sherryll Burger, RN BSN;Joseph Hood RCP,RRT,BSRT;Jeanna Durrell BS, Exercise Physiologist    Virtual Visit  No    Medication changes reported      No    Fall or balance concerns reported     No    Warm-up and Cool-down  Performed on first and last piece of equipment    Resistance Training Performed  Yes    VAD Patient?  No    PAD/SET Patient?  No      Pain Assessment   Currently in Pain?  No/denies          Social History   Tobacco Use  Smoking Status Never Smoker  Smokeless Tobacco Never Used    Goals Met:  Proper associated with RPD/PD & O2 Sat Independence with exercise equipment Exercise tolerated well No report of cardiac concerns or symptoms  Goals Unmet:  Not Applicable  Comments: Pt able to follow exercise prescription today without complaint.  Will continue to monitor for progression.    Dr. Emily Filbert is Medical Director for [place] and [facility].

## 2019-07-09 ENCOUNTER — Ambulatory Visit (INDEPENDENT_AMBULATORY_CARE_PROVIDER_SITE_OTHER): Payer: BC Managed Care – PPO | Admitting: Psychology

## 2019-07-09 ENCOUNTER — Encounter: Payer: BC Managed Care – PPO | Admitting: *Deleted

## 2019-07-09 ENCOUNTER — Ambulatory Visit: Payer: BC Managed Care – PPO

## 2019-07-09 ENCOUNTER — Other Ambulatory Visit: Payer: Self-pay

## 2019-07-09 DIAGNOSIS — F3289 Other specified depressive episodes: Secondary | ICD-10-CM

## 2019-07-09 DIAGNOSIS — M5417 Radiculopathy, lumbosacral region: Secondary | ICD-10-CM | POA: Diagnosis not present

## 2019-07-09 DIAGNOSIS — I509 Heart failure, unspecified: Secondary | ICD-10-CM | POA: Diagnosis not present

## 2019-07-09 DIAGNOSIS — M62838 Other muscle spasm: Secondary | ICD-10-CM | POA: Diagnosis not present

## 2019-07-09 DIAGNOSIS — I5032 Chronic diastolic (congestive) heart failure: Secondary | ICD-10-CM

## 2019-07-09 DIAGNOSIS — R0609 Other forms of dyspnea: Secondary | ICD-10-CM

## 2019-07-09 DIAGNOSIS — Z713 Dietary counseling and surveillance: Secondary | ICD-10-CM | POA: Diagnosis not present

## 2019-07-09 DIAGNOSIS — R0600 Dyspnea, unspecified: Secondary | ICD-10-CM | POA: Diagnosis not present

## 2019-07-09 DIAGNOSIS — I872 Venous insufficiency (chronic) (peripheral): Secondary | ICD-10-CM | POA: Diagnosis not present

## 2019-07-09 DIAGNOSIS — M545 Low back pain: Secondary | ICD-10-CM | POA: Diagnosis not present

## 2019-07-09 DIAGNOSIS — M6281 Muscle weakness (generalized): Secondary | ICD-10-CM | POA: Diagnosis not present

## 2019-07-09 DIAGNOSIS — E1159 Type 2 diabetes mellitus with other circulatory complications: Secondary | ICD-10-CM | POA: Diagnosis not present

## 2019-07-09 DIAGNOSIS — Z6841 Body Mass Index (BMI) 40.0 and over, adult: Secondary | ICD-10-CM | POA: Diagnosis not present

## 2019-07-09 NOTE — Progress Notes (Signed)
Daily Session Note  Patient Details  Name: Brandy [name] MRN: [id number] Date of Birth: 04/25/1966 Referring Provider:     Pulmonary Rehab from 04/06/2019 in [facility] Cardiac and Pulmonary Rehab  Referring Provider  Gollan      Encounter Date: 07/09/2019  Check In: Session Check In - 07/09/19 0834      Check-In   Supervising physician immediately available to respond to emergencies  See telemetry face sheet for immediately available ER MD    Location  [facility]-Cardiac & Pulmonary Rehab    Staff Present  Heath Lark, RN, BSN, CCRP;Jeanna Durrell BS, Exercise Physiologist;Amanda Oletta Darter, BA, ACSM CEP, Exercise Physiologist    Virtual Visit  No    Medication changes reported      No    Fall or balance concerns reported     No    Warm-up and Cool-down  Performed on first and last piece of equipment    Resistance Training Performed  Yes    VAD Patient?  No    PAD/SET Patient?  No      Pain Assessment   Currently in Pain?  No/denies          Social History   Tobacco Use  Smoking Status Never Smoker  Smokeless Tobacco Never Used    Goals Met:  Independence with exercise equipment Exercise tolerated well No report of cardiac concerns or symptoms  Goals Unmet:  Not Applicable  Comments: Pt able to follow exercise prescription today without complaint.  Will continue to monitor for progression.    Dr. Emily Filbert is Medical Director for [place] and [facility].

## 2019-07-13 ENCOUNTER — Encounter: Payer: BC Managed Care – PPO | Admitting: *Deleted

## 2019-07-13 ENCOUNTER — Other Ambulatory Visit: Payer: Self-pay

## 2019-07-13 DIAGNOSIS — I872 Venous insufficiency (chronic) (peripheral): Secondary | ICD-10-CM | POA: Diagnosis not present

## 2019-07-13 DIAGNOSIS — E1159 Type 2 diabetes mellitus with other circulatory complications: Secondary | ICD-10-CM | POA: Diagnosis not present

## 2019-07-13 DIAGNOSIS — R0600 Dyspnea, unspecified: Secondary | ICD-10-CM | POA: Diagnosis not present

## 2019-07-13 DIAGNOSIS — I5032 Chronic diastolic (congestive) heart failure: Secondary | ICD-10-CM

## 2019-07-13 DIAGNOSIS — I509 Heart failure, unspecified: Secondary | ICD-10-CM | POA: Diagnosis not present

## 2019-07-13 DIAGNOSIS — R0609 Other forms of dyspnea: Secondary | ICD-10-CM

## 2019-07-13 DIAGNOSIS — Z713 Dietary counseling and surveillance: Secondary | ICD-10-CM | POA: Diagnosis not present

## 2019-07-13 DIAGNOSIS — Z6841 Body Mass Index (BMI) 40.0 and over, adult: Secondary | ICD-10-CM | POA: Diagnosis not present

## 2019-07-13 NOTE — Progress Notes (Signed)
Daily Session Note  Patient Details  Name: Brandy [name] MRN: [id number] Date of Birth: [birth year] Referring Provider:     Pulmonary Rehab from 04/06/2019 in [facility] Cardiac and Pulmonary Rehab  Referring Provider  Gollan      Encounter Date: 07/13/2019  Check In: Session Check In - 07/13/19 0953      Check-In   Supervising physician immediately available to respond to emergencies  See telemetry face sheet for immediately available ER MD    Location  [facility]-Cardiac & Pulmonary Rehab    Staff Present  Heath Lark, RN, BSN, CCRP;Meredith Sherryll Burger, RN BSN;Jessica Camden, MA, RCEP, CCRP, CCET;Joseph Avalon RCP,RRT,BSRT    Virtual Visit  No    Medication changes reported      No    Fall or balance concerns reported     No    Warm-up and Cool-down  Performed on first and last piece of equipment    Resistance Training Performed  Yes    VAD Patient?  No    PAD/SET Patient?  No      Pain Assessment   Currently in Pain?  No/denies          Social History   Tobacco Use  Smoking Status Never Smoker  Smokeless Tobacco Never Used    Goals Met:  Proper associated with RPD/PD & O2 Sat Independence with exercise equipment Exercise tolerated well No report of cardiac concerns or symptoms  Goals Unmet:  Not Applicable  Comments: Pt able to follow exercise prescription today without complaint.  Will continue to monitor for progression.    Dr. Emily Filbert is Medical Director for [place] and [facility].

## 2019-07-14 ENCOUNTER — Ambulatory Visit: Payer: BC Managed Care – PPO

## 2019-07-14 ENCOUNTER — Encounter: Payer: Self-pay | Admitting: Obstetrics and Gynecology

## 2019-07-14 ENCOUNTER — Other Ambulatory Visit: Payer: Self-pay | Admitting: Obstetrics and Gynecology

## 2019-07-14 ENCOUNTER — Ambulatory Visit (INDEPENDENT_AMBULATORY_CARE_PROVIDER_SITE_OTHER): Payer: BC Managed Care – PPO | Admitting: Obstetrics and Gynecology

## 2019-07-14 ENCOUNTER — Other Ambulatory Visit: Payer: Self-pay

## 2019-07-14 ENCOUNTER — Ambulatory Visit (INDEPENDENT_AMBULATORY_CARE_PROVIDER_SITE_OTHER)
Admission: RE | Admit: 2019-07-14 | Discharge: 2019-07-14 | Disposition: A | Discharge status: Home / Self Care | Payer: BC Managed Care – PPO | Source: Ambulatory Visit | Attending: Primary Care | Admitting: Primary Care

## 2019-07-14 VITALS — BP 135/86 | HR 72 | Wt 383.2 lb

## 2019-07-14 DIAGNOSIS — B3731 Acute candidiasis of vulva and vagina: Secondary | ICD-10-CM

## 2019-07-14 DIAGNOSIS — Z86018 Personal history of other benign neoplasm: Secondary | ICD-10-CM

## 2019-07-14 DIAGNOSIS — B373 Candidiasis of vulva and vagina: Secondary | ICD-10-CM | POA: Diagnosis not present

## 2019-07-14 DIAGNOSIS — Z124 Encounter for screening for malignant neoplasm of cervix: Secondary | ICD-10-CM | POA: Diagnosis not present

## 2019-07-14 DIAGNOSIS — M5417 Radiculopathy, lumbosacral region: Secondary | ICD-10-CM | POA: Diagnosis not present

## 2019-07-14 DIAGNOSIS — N950 Postmenopausal bleeding: Secondary | ICD-10-CM

## 2019-07-14 DIAGNOSIS — M79661 Pain in right lower leg: Secondary | ICD-10-CM

## 2019-07-14 DIAGNOSIS — M6281 Muscle weakness (generalized): Secondary | ICD-10-CM | POA: Diagnosis not present

## 2019-07-14 DIAGNOSIS — M545 Low back pain: Secondary | ICD-10-CM | POA: Diagnosis not present

## 2019-07-14 DIAGNOSIS — Z113 Encounter for screening for infections with a predominantly sexual mode of transmission: Secondary | ICD-10-CM | POA: Diagnosis not present

## 2019-07-14 DIAGNOSIS — Z794 Long term (current) use of insulin: Secondary | ICD-10-CM | POA: Diagnosis not present

## 2019-07-14 DIAGNOSIS — E119 Type 2 diabetes mellitus without complications: Secondary | ICD-10-CM | POA: Diagnosis not present

## 2019-07-14 DIAGNOSIS — Z1151 Encounter for screening for human papillomavirus (HPV): Secondary | ICD-10-CM

## 2019-07-14 DIAGNOSIS — N858 Other specified noninflammatory disorders of uterus: Secondary | ICD-10-CM | POA: Diagnosis not present

## 2019-07-14 DIAGNOSIS — M62838 Other muscle spasm: Secondary | ICD-10-CM | POA: Diagnosis not present

## 2019-07-14 HISTORY — PX: ENDOMETRIAL BIOPSY: PRO73

## 2019-07-14 MED ORDER — MICONAZOLE NITRATE 2 % VA CREA: 1.0000 | TOPICAL_CREAM | Freq: Every day | VAGINAL | 2 refills | Status: DC

## 2019-07-14 NOTE — Procedures (Signed)
Endometrial Biopsy Procedure Note  Pre-operative Diagnosis: Postmenopausal bleeding. BI 60s  Post-operative Diagnosis: same   Procedure Details  The risks (including infection, bleeding, pain, and uterine perforation) and benefits of the procedure were explained to the patient and Written informed consent was obtained.  The patient was placed in the dorsal lithotomy position.  A Graves' speculum inserted in the vagina, and the cervix was visualized and a pap smear performed. The cervix was then prepped with povidone iodine, and a sharp tenaculum was applied to the anterior lip of the cervix for stabilization.  A pipelle was inserted into the uterine cavity and sounded the uterus to a depth of 9cm.  A Minimal amount of tissue was collected after 3 passes. The sample was sent for pathologic examination.  Condition: Stable  Complications: None  Plan: The patient was advised to call for any fever or for prolonged or severe pain or bleeding. She was advised to use OTC analgesics as needed for mild to moderate pain. She was advised to avoid vaginal intercourse for 48 hours or until the bleeding has completely stopped.  Cornelia Copa MD Attending Center for [facility])

## 2019-07-14 NOTE — Progress Notes (Signed)
Obstetrics and Gynecology New Patient Evaluation  Appointment Date: 07/14/2019  OBGYN Clinic: [facility] for [facility]  Primary Care Provider: Doreene Nest  Referring Provider: Doreene Nest, NP  Chief Complaint: Postmenopausal bleeding  History of Present Illness: Brandy [name] is a 54 y.o. Caucasian P3 (No LMP recorded (lmp unknown). Patient is postmenopausal.), seen for the above chief complaint.   Patient had two periods in February (1st about a week, somewhat heavy and mildly crampy) and 2nd one that was a little shorter and less intense in late February into March; she is not having any current VB. She last had a period in January 2020 and then November 2019 and January 2019. No h/o HRT.   Review of Systems: Pertinent items noted in HPI and remainder of comprehensive ROS otherwise negative.   Patient Active Problem List   Diagnosis Date Noted  . 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 Post-menopausal bleeding 06/17/2019  . Vaginal burning 06/17/2019  . Urinary incontinence 06/17/2019  . Lower extremity weakness 05/13/2019  . Poor short term memory 05/13/2019  . Left groin pain 03/18/2019  . Rosacea 08/25/2018  . Chronic venous insufficiency 08/07/2018  . PAD (peripheral artery disease) (HCC) 08/07/2018  . Chronic bilateral low back pain with left-sided sciatica 07/04/2018  . Vitamin B12 deficiency 05/23/2018  . Arthralgia of multiple sites 05/01/2018  . Abnormal TSH 03/17/2018  . GAD (generalized anxiety disorder) 02/24/2018  . Vitamin D deficiency 02/24/2018  . Bilateral hand pain 04/06/2014  . Bilateral wrist pain 04/06/2014  . Chronic fatigue 03/15/2014  . Oligomenorrhea 03/15/2014  . Palpitations 09/20/2010  . Essential hypertension, benign 09/14/2010  . HYPERTRIGLYCERIDEMIA 10/04/2009  . Lower extremity pain 10/04/2009  . Type 2 diabetes mellitus (HCC) 08/12/2009  . OBESITY, MORBID 08/12/2009  . MDD  (major depressive disorder) 08/12/2009     Past Medical History:  Past Medical History:  Diagnosis Date  . Anxiety   . Depression   . Diabetes mellitus without complication (HCC)   . Dysmetabolic syndrome X   . Hypertension   . Hypertriglyceridemia   . Obesity   . Tetanus vaccine causing adverse effect in therapeutic use     Past Surgical History:  Past Surgical History:  Procedure Laterality Date  . 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 ENDOMETRIAL BIOPSY  07/14/2019        Past Obstetrical History:  OB History  Gravida Para Term Preterm AB Living  3 3 3         SAB TAB Ectopic Multiple Live Births          3    # Outcome Date GA Lbr Len/2nd Weight Sex Delivery Anes PTL Lv  3 Term           2 Term           1 Term             Obstetric Comments  Vaginal delivery x 3    Past Gynecological History: As per HPI. History of Pap Smear(s): Yes.   Last pap 2015, which was negative and HPV negative  Social History:  Social History   Socioeconomic History  . Marital status: Married    Spouse name: Not on file  . Number of children: 3  . Years of education: Not on file  . Highest education level: Not on file  Occupational History  . Occupation: 2016: [employer]  Tobacco Use  . Smoking status: Never Smoker  . Smokeless tobacco: Never Used  Substance and Sexual Activity  . Alcohol use: Yes    Comment: rarely - once a year  . Drug use: No  . Sexual activity: Not on file  Other Topics Concern  . Not on file  Social History Narrative   Designer, jewellery      Married; 3 kids (middle child-severely autistic)      No regular exercise   Social Determinants of Health   Financial Resource Strain:   . Difficulty of Paying Living Expenses:   Food Insecurity:   . Worried About Charity fundraiser in the Last Year:   . Arboriculturist in the Last Year:   Transportation Needs:   . Film/video editor (Medical):   Marland Kitchen Lack of  Transportation (Non-Medical):   Physical Activity:   . Days of Exercise per Week:   . Minutes of Exercise per Session:   Stress:   . Feeling of Stress :   Social Connections:   . Frequency of Communication with Friends and Family:   . Frequency of Social Gatherings with Friends and Family:   . Attends Religious Services:   . Active Member of Clubs or Organizations:   . Attends Archivist Meetings:   Marland Kitchen Marital Status:   Intimate Partner Violence:   . Fear of Current or Ex-Partner:   . Emotionally Abused:   Marland Kitchen Physically Abused:   . Sexually Abused:     Family History:  Family History  Problem Relation Age of Onset  . Rheum arthritis Mother   . COPD Mother   . Hypertension Father   . Heart disease Maternal Grandmother   . Stroke Maternal Grandmother   . Alcohol abuse Maternal Grandmother   . Alcohol abuse Maternal Grandfather   . Aneurysm Paternal Grandmother   . Autism Son        severe  . Diabetes Paternal Aunt     Medications Isabell Bonafede. [place] had no medications administered during this visit. Current Outpatient Medications  Medication Sig Dispense Refill  . ARIPiprazole (ABILIFY) 5 MG tablet Take 5 mg by mouth daily.    Marland Kitchen aspirin EC 81 MG tablet Take 81 mg by mouth daily.    Marland Kitchen atorvastatin (LIPITOR) 10 MG tablet TAKE 1 TABLET (10 MG TOTAL) BY MOUTH DAILY. FOR CHOLESTEROL. 90 tablet 0  . celecoxib (CELEBREX) 100 MG capsule Take 1 capsule (100 mg total) by mouth 2 (two) times daily. As needed for pain. 180 capsule 1  . cetirizine (ZYRTEC) 10 MG tablet TAKE 1 TABLET BY MOUTH EVERY DAY 90 tablet 0  . cholecalciferol (VITAMIN D3) 25 MCG (1000 UT) tablet Take 1,000 Units by mouth daily.    . citalopram (CELEXA) 20 MG tablet Take 20 mg by mouth daily.    . Continuous Blood Gluc Receiver (DEXCOM G6 RECEIVER) DEVI Inject 1 Device into the skin daily. 4 each 1  . Continuous Blood Gluc Sensor (DEXCOM G6 SENSOR) MISC Inject 1 Device into the skin daily. 3 each 1  .  Continuous Blood Gluc Transmit (DEXCOM G6 TRANSMITTER) MISC Inject 1 Device into the skin daily. 4 each 1  . Cyanocobalamin (VITAMIN B-12 PO) Take 1 tablet by mouth daily.    . DULoxetine (CYMBALTA) 30 MG capsule Take 30 mg by mouth daily.     . DULoxetine (CYMBALTA) 60 MG capsule Take 60 mg by mouth daily.    . folic  acid (FOLVITE) 1 MG tablet Take 1 mg by mouth daily.    Marland Kitchen gabapentin (NEURONTIN) 300 MG capsule Take 300 mg by mouth 3 (three) times daily.    Marland Kitchen glipiZIDE (GLUCOTROL) 10 MG tablet TAKE 1 TABLET (10 MG TOTAL) BY MOUTH 2 (TWO) TIMES DAILY BEFORE A MEAL. FOR DIABETES. 180 tablet 3  . hydrocortisone 2.5 % cream Apply topically 2 (two) times daily.    . Insulin Glargine (BASAGLAR KWIKPEN) 100 UNIT/ML SOPN Inject 0.35 mLs (35 Units total) into the skin at bedtime. 15 mL 2  . Insulin Pen Needle (PEN NEEDLES) 31G X 6 MM MISC Use nightly with insulin. 100 each 2  . lisinopril-hydrochlorothiazide (ZESTORETIC) 20-12.5 MG tablet TAKE 1 TABLET BY MOUTH DAILY FOR BLOOD PRESSURE 90 tablet 1  . metFORMIN (GLUCOPHAGE-XR) 500 MG 24 hr tablet TAKE 2 TABLETS (1,000 MG TOTAL) BY MOUTH DAILY WITH BREAKFAST. FOR DIABETES. 180 tablet 1  . metroNIDAZOLE (METROCREAM) 0.75 % cream Apply 1 application topically daily.    . Multiple Minerals-Vitamins (CALCIUM-MAGNESIUM-ZINC-D3 PO) Take 1 tablet by mouth daily.    . traZODone (DESYREL) 50 MG tablet Take 50 mg by mouth at bedtime as needed. Taking 1/2 tab daily    . Vitamin D, Ergocalciferol, (DRISDOL) 1.25 MG (50000 UT) CAPS capsule TAKE 1 CAPSULE (50,000 UNITS TOTAL) BY MOUTH EVERY 7 (SEVEN) DAYS. 12 capsule 1  . sulfamethoxazole-trimethoprim (BACTRIM DS) 800-160 MG tablet Take 1 tablet by mouth 2 (two) times daily. For urinary tract infection. (Patient not taking: Reported on 07/14/2019) 10 tablet 0   No current facility-administered medications for this visit.    Allergies Diphenhydramine hcl and Penicillins   Physical Exam:  BP 135/86   Pulse 72   Wt  (!) 383 lb 3.2 oz (173.8 kg)   LMP  (LMP Unknown)   BMI 60.02 kg/m  Body mass index is 60.02 kg/m. General appearance: Well nourished, well developed female in no acute distress.  Respiratory:  Normal respiratory effort Abdomen: positive bowel sounds and no masses, hernias; diffusely non tender to palpation, non distended Neuro/Psych:  Normal mood and affect.  Skin:  Warm and dry.  Lymphatic:  No inguinal lymphadenopathy.   Pelvic exam: is not limited by body habitus EGBUS: within normal limits Vagina: within normal limits and with no blood vault; white cottage cheese like d/c in the vault Cervix: normal appearing cervix without tenderness, discharge or lesions.  Uterus:  nonenlarged and non tender Adnexa:  normal adnexa and no mass, fullness, tenderness Rectovaginal: deferred  See procedure note for EMBx  Laboratory: none  Radiology: none  Assessment: pt stable  Plan:  1. Postmenopausal bleeding Phone visit after u/s is back.  Patient almost done with bactrim. Monistat 7.  - Cytology - PAP( [place]) - US PELVIC COMPLETE WITH TRANSVAGINAL; Future - Surgical pathology( [place]/ [facility])  Cornelia Copa MD Attending Center for [facility])

## 2019-07-15 ENCOUNTER — Ambulatory Visit: Payer: BC Managed Care – PPO

## 2019-07-15 DIAGNOSIS — F411 Generalized anxiety disorder: Secondary | ICD-10-CM | POA: Diagnosis not present

## 2019-07-15 DIAGNOSIS — F331 Major depressive disorder, recurrent, moderate: Secondary | ICD-10-CM | POA: Diagnosis not present

## 2019-07-15 DIAGNOSIS — F5105 Insomnia due to other mental disorder: Secondary | ICD-10-CM | POA: Diagnosis not present

## 2019-07-16 ENCOUNTER — Ambulatory Visit: Payer: BC Managed Care – PPO

## 2019-07-16 ENCOUNTER — Ambulatory Visit (INDEPENDENT_AMBULATORY_CARE_PROVIDER_SITE_OTHER): Payer: BC Managed Care – PPO | Admitting: Psychology

## 2019-07-16 DIAGNOSIS — M25512 Pain in left shoulder: Secondary | ICD-10-CM | POA: Diagnosis not present

## 2019-07-16 DIAGNOSIS — M79605 Pain in left leg: Secondary | ICD-10-CM | POA: Diagnosis not present

## 2019-07-16 DIAGNOSIS — M79604 Pain in right leg: Secondary | ICD-10-CM | POA: Diagnosis not present

## 2019-07-16 DIAGNOSIS — F3289 Other specified depressive episodes: Secondary | ICD-10-CM

## 2019-07-16 DIAGNOSIS — M545 Low back pain: Secondary | ICD-10-CM | POA: Diagnosis not present

## 2019-07-17 LAB — CYTOLOGY - PAP
Chlamydia: NEGATIVE
Comment: NEGATIVE
Comment: NEGATIVE
Comment: NEGATIVE
Comment: NORMAL
Diagnosis: REACTIVE
High risk HPV: NEGATIVE
Neisseria Gonorrhea: NEGATIVE
Trichomonas: NEGATIVE

## 2019-07-20 ENCOUNTER — Telehealth: Payer: Self-pay | Admitting: *Deleted

## 2019-07-20 NOTE — Telephone Encounter (Signed)
-----   Message from Shawmut Bing, MD sent at 07/20/2019  8:39 AM EDT ----- Can you call her and let her know that I'm on vacation but will follow up with her when I'm back, but that all of her pathology (biopsy and pap) came back normal and no evidence of cancer? Thanks!

## 2019-07-20 NOTE — Telephone Encounter (Signed)
Called pt and informed of her of results and that Dr Vergie Living will follow up with her when he gets back from vacation.

## 2019-07-21 ENCOUNTER — Ambulatory Visit: Payer: BC Managed Care – PPO

## 2019-07-21 DIAGNOSIS — I5032 Chronic diastolic (congestive) heart failure: Secondary | ICD-10-CM

## 2019-07-22 ENCOUNTER — Encounter: Payer: Self-pay | Admitting: *Deleted

## 2019-07-22 ENCOUNTER — Encounter: Payer: BC Managed Care – PPO | Admitting: *Deleted

## 2019-07-22 ENCOUNTER — Other Ambulatory Visit: Payer: Self-pay

## 2019-07-22 ENCOUNTER — Ambulatory Visit: Payer: BC Managed Care – PPO

## 2019-07-22 DIAGNOSIS — Z713 Dietary counseling and surveillance: Secondary | ICD-10-CM | POA: Diagnosis not present

## 2019-07-22 DIAGNOSIS — M62838 Other muscle spasm: Secondary | ICD-10-CM | POA: Diagnosis not present

## 2019-07-22 DIAGNOSIS — I872 Venous insufficiency (chronic) (peripheral): Secondary | ICD-10-CM | POA: Diagnosis not present

## 2019-07-22 DIAGNOSIS — R0600 Dyspnea, unspecified: Secondary | ICD-10-CM | POA: Diagnosis not present

## 2019-07-22 DIAGNOSIS — I5032 Chronic diastolic (congestive) heart failure: Secondary | ICD-10-CM

## 2019-07-22 DIAGNOSIS — R0609 Other forms of dyspnea: Secondary | ICD-10-CM

## 2019-07-22 DIAGNOSIS — M5417 Radiculopathy, lumbosacral region: Secondary | ICD-10-CM | POA: Diagnosis not present

## 2019-07-22 DIAGNOSIS — M6281 Muscle weakness (generalized): Secondary | ICD-10-CM | POA: Diagnosis not present

## 2019-07-22 DIAGNOSIS — E1159 Type 2 diabetes mellitus with other circulatory complications: Secondary | ICD-10-CM | POA: Diagnosis not present

## 2019-07-22 DIAGNOSIS — I509 Heart failure, unspecified: Secondary | ICD-10-CM | POA: Diagnosis not present

## 2019-07-22 DIAGNOSIS — M545 Low back pain: Secondary | ICD-10-CM | POA: Diagnosis not present

## 2019-07-22 DIAGNOSIS — Z6841 Body Mass Index (BMI) 40.0 and over, adult: Secondary | ICD-10-CM | POA: Diagnosis not present

## 2019-07-22 NOTE — Progress Notes (Signed)
Pulmonary Individual Treatment Plan  Patient Details  Name: Brandy [name] MRN: [id number] Date of Birth: Jun 12, 1965 Referring Provider:     Pulmonary Rehab from 04/06/2019 in [facility]  Referring Provider  Gollan      Initial Encounter Date:    Pulmonary Rehab from 04/06/2019 in [facility] Cardiac and Pulmonary Rehab  Date  04/06/19      Visit Diagnosis: Heart failure, diastolic, chronic ([place])  Dyspnea on exertion  Patient's Home Medications on Admission:  Current Outpatient Medications:  .  ARIPiprazole (ABILIFY) 5 MG tablet, Take 5 mg by mouth daily., Disp: , Rfl:  .  aspirin EC 81 MG tablet, Take 81 mg by mouth daily., Disp: , Rfl:  .  atorvastatin (LIPITOR) 10 MG tablet, TAKE 1 TABLET (10 MG TOTAL) BY MOUTH DAILY. FOR CHOLESTEROL., Disp: 90 tablet, Rfl: 0 .  celecoxib (CELEBREX) 100 MG capsule, Take 1 capsule (100 mg total) by mouth 2 (two) times daily. As needed for pain., Disp: 180 capsule, Rfl: 1 .  cetirizine (ZYRTEC) 10 MG tablet, TAKE 1 TABLET BY MOUTH EVERY DAY, Disp: 90 tablet, Rfl: 0 .  cholecalciferol (VITAMIN D3) 25 MCG (1000 UT) tablet, Take 1,000 Units by mouth daily., Disp: , Rfl:  .  citalopram (CELEXA) 20 MG tablet, Take 20 mg by mouth daily., Disp: , Rfl:  .  Continuous Blood Gluc Receiver (DEXCOM G6 RECEIVER) DEVI, Inject 1 Device into the skin daily., Disp: 4 each, Rfl: 1 .  Continuous Blood Gluc Sensor (DEXCOM G6 SENSOR) MISC, Inject 1 Device into the skin daily., Disp: 3 each, Rfl: 1 .  Continuous Blood Gluc Transmit (DEXCOM G6 TRANSMITTER) MISC, Inject 1 Device into the skin daily., Disp: 4 each, Rfl: 1 .  Cyanocobalamin (VITAMIN B-12 PO), Take 1 tablet by mouth daily., Disp: , Rfl:  .  DULoxetine (CYMBALTA) 30 MG capsule, Take 30 mg by mouth daily. , Disp: , Rfl:  .  DULoxetine (CYMBALTA) 60 MG capsule, Take 60 mg by mouth daily., Disp: , Rfl:  .  folic acid (FOLVITE) 1 MG tablet, Take 1 mg by mouth daily., Disp: , Rfl:  .  gabapentin  (NEURONTIN) 300 MG capsule, Take 300 mg by mouth 3 (three) times daily., Disp: , Rfl:  .  glipiZIDE (GLUCOTROL) 10 MG tablet, TAKE 1 TABLET (10 MG TOTAL) BY MOUTH 2 (TWO) TIMES DAILY BEFORE A MEAL. FOR DIABETES., Disp: 180 tablet, Rfl: 3 .  hydrocortisone 2.5 % cream, Apply topically 2 (two) times daily., Disp: , Rfl:  .  Insulin Glargine (BASAGLAR KWIKPEN) 100 UNIT/ML SOPN, Inject 0.35 mLs (35 Units total) into the skin at bedtime., Disp: 15 mL, Rfl: 2 .  Insulin Pen Needle (PEN NEEDLES) 31G X 6 MM MISC, Use nightly with insulin., Disp: 100 each, Rfl: 2 .  lisinopril-hydrochlorothiazide (ZESTORETIC) 20-12.5 MG tablet, TAKE 1 TABLET BY MOUTH DAILY FOR BLOOD PRESSURE, Disp: 90 tablet, Rfl: 1 .  metFORMIN (GLUCOPHAGE-XR) 500 MG 24 hr tablet, TAKE 2 TABLETS (1,000 MG TOTAL) BY MOUTH DAILY WITH BREAKFAST. FOR DIABETES., Disp: 180 tablet, Rfl: 1 .  metroNIDAZOLE (METROCREAM) 0.75 % cream, Apply 1 application topically daily., Disp: , Rfl:  .  miconazole (MONISTAT 7) 2 % vaginal cream, Place 1 Applicatorful vaginally at bedtime. Apply for seven nights, Disp: 30 g, Rfl: 2 .  Multiple Minerals-Vitamins (CALCIUM-MAGNESIUM-ZINC-D3 PO), Take 1 tablet by mouth daily., Disp: , Rfl:  .  sulfamethoxazole-trimethoprim (BACTRIM DS) 800-160 MG tablet, Take 1 tablet by mouth 2 (two) times daily.  For urinary tract infection. (Patient not taking: Reported on 07/14/2019), Disp: 10 tablet, Rfl: 0 .  traZODone (DESYREL) 50 MG tablet, Take 50 mg by mouth at bedtime as needed. Taking 1/2 tab daily, Disp: , Rfl:  .  Vitamin D, Ergocalciferol, (DRISDOL) 1.25 MG (50000 UT) CAPS capsule, TAKE 1 CAPSULE (50,000 UNITS TOTAL) BY MOUTH EVERY 7 (SEVEN) DAYS., Disp: 12 capsule, Rfl: 1  Past Medical History: Past Medical History:  Diagnosis Date  . Anxiety   . Depression   . Diabetes mellitus without complication ([place])   . Dysmetabolic syndrome X   . Hypertension   . Hypertriglyceridemia   . Obesity   . 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     Tobacco Use: Social History   Tobacco Use  Smoking Status Never Smoker  Smokeless Tobacco Never Used    Labs: Recent Review Flowsheet Data    Labs for ITP Cardiac and Pulmonary Rehab Latest Ref Rng & Units 07/15/2018 08/29/2018 12/10/2018 03/18/2019 06/17/2019   Cholestrol 0 - 200 mg/dL - - - 160 -   LDLCALC 0 - 99 mg/dL - - - 71 -   LDLDIRECT mg/dL - - - - -   HDL >39.00 mg/dL - - - 56.70 -   Trlycerides 0.0 - 149.0 mg/dL - - - 159.0(H) -   Hemoglobin A1c 4.0 - 5.6 % - 8.5(H) 8.9(A) 9.1(A) 9.2(A)   PHART 7.350 - 7.450 - - - - -   PCO2ART 32.0 - 48.0 mmHg - - - - -   HCO3 20.0 - 28.0 mmol/L - - - - -   ACIDBASEDEF 0.0 - 2.0 mmol/L - - - - -   O2SAT % 97.9 - - - -       Pulmonary Assessment Scores: Pulmonary Assessment Scores    Row Name 04/06/19 1704         ADL UCSD   SOB Score total  83     Rest  0     Walk  4     Stairs  5     Bath  4     Dress  1     Shop  5       CAT Score   CAT Score  18       mMRC Score   mMRC Score  3        UCSD: Self-administered rating of dyspnea associated with activities of daily living (ADLs) 6-point scale (0 = "not at all" to 5 = "maximal or unable to do because of breathlessness")  Scoring Scores range from 0 to 120.  Minimally important difference is 5 units  CAT: CAT can identify the health impairment of COPD patients and is better correlated with disease progression.  CAT has a scoring range of zero to 40. The CAT score is classified into four groups of low (less than 10), medium (10 - 20), high (21-30) and very high (31-40) based on the impact level of disease on health status. A CAT score over 10 suggests significant symptoms.  A worsening CAT score could be explained by an exacerbation, poor medication adherence, poor inhaler technique, or progression of COPD or comorbid conditions.  CAT MCID is 2 points  mMRC: mMRC (Modified Medical Research Council) Dyspnea Scale is used to assess  the degree of baseline functional disability in patients of respiratory disease due to dyspnea. No minimal important difference is established. A decrease in score of 1 point or greater is considered a  positive change.   Pulmonary Function Assessment:   Exercise Target Goals: Exercise Program Goal: Individual exercise prescription set using results from initial 6 min walk test and THRR while considering  patient's activity barriers and safety.   Exercise Prescription Goal: Initial exercise prescription builds to 30-45 minutes a day of aerobic activity, 2-3 days per week.  Home exercise guidelines will be given to patient during program as part of exercise prescription that the participant will acknowledge.  Activity Barriers & Risk Stratification: Activity Barriers & Cardiac Risk Stratification - 04/01/19 1413      Activity Barriers & Cardiac Risk Stratification   Activity Barriers  Arthritis;Deconditioning;Muscular Weakness;Shortness of Breath;Fibromyalgia    Comments  chronic fatigue, neuropathy.    Cardiac Risk Stratification  High       6 Minute Walk: 6 Minute Walk    Row Name 04/06/19 1659 06/29/19 1140       6 Minute Walk   Phase  Initial  -    Distance  475 feet  500 feet    Walk Time  4 minutes  4.5 minutes    # of Rest Breaks  2  2    MPH  1.34  1.26    METS  1  1    RPE  14  17    Perceived Dyspnea   3  3    VO2 Peak  3.4  3.3    Symptoms  Yes (comment)  Yes (comment)    Comments  back and leg pain  shortness of breath/ back and leg pain    Resting HR  99 bpm  92 bpm    Resting BP  154/98  112/70    Resting Oxygen Saturation   96 %  96 %    Exercise Oxygen Saturation  during 6 min walk  95 %  95 %    Max Ex. HR  145 bpm  135 bpm    Max Ex. BP  168/102  146/88      Oxygen Initial Assessment: Oxygen Initial Assessment - 04/01/19 1412      Home Oxygen   Home Oxygen Device  None    Sleep Oxygen Prescription  None    Home Exercise Oxygen Prescription  None     Home at Rest Exercise Oxygen Prescription  None      Initial 6 min Walk   Oxygen Used  None      Program Oxygen Prescription   Program Oxygen Prescription  None      Intervention   Short Term Goals  To learn and understand importance of monitoring SPO2 with pulse oximeter and demonstrate accurate use of the pulse oximeter.;To learn and understand importance of maintaining oxygen saturations>88%;To learn and demonstrate proper pursed lip breathing techniques or other breathing techniques.    Long  Term Goals  Verbalizes importance of monitoring SPO2 with pulse oximeter and return demonstration;Maintenance of O2 saturations>88%;Exhibits proper breathing techniques, such as pursed lip breathing or other method taught during program session       Oxygen Re-Evaluation: Oxygen Re-Evaluation    Row Name 06/29/19 0951             Program Oxygen Prescription   Program Oxygen Prescription  None         Home Oxygen   Home Oxygen Device  None       Sleep Oxygen Prescription  None       Home Exercise Oxygen Prescription  None  Compliance with Home Oxygen Use  Yes         Goals/Expected Outcomes   Short Term Goals  To learn and demonstrate proper pursed lip breathing techniques or other breathing techniques.       Long  Term Goals  Exhibits proper breathing techniques, such as pursed lip breathing or other method taught during program session       Comments  Reviewed PLB technique with pt.  Talked about how it works and it's importance in maintaining their exercise saturations.       Goals/Expected Outcomes  Short: Become more profiecient at using PLB.   Long: Become independent at using PLB.          Oxygen Discharge (Final Oxygen Re-Evaluation): Oxygen Re-Evaluation - 06/29/19 0951      Program Oxygen Prescription   Program Oxygen Prescription  None      Home Oxygen   Home Oxygen Device  None    Sleep Oxygen Prescription  None    Home Exercise Oxygen Prescription  None     Compliance with Home Oxygen Use  Yes      Goals/Expected Outcomes   Short Term Goals  To learn and demonstrate proper pursed lip breathing techniques or other breathing techniques.    Long  Term Goals  Exhibits proper breathing techniques, such as pursed lip breathing or other method taught during program session    Comments  Reviewed PLB technique with pt.  Talked about how it works and it's importance in maintaining their exercise saturations.    Goals/Expected Outcomes  Short: Become more profiecient at using PLB.   Long: Become independent at using PLB.       Initial Exercise Prescription: Initial Exercise Prescription - 04/06/19 1700      Date of Initial Exercise RX and Referring Provider   Date  04/06/19    Referring Provider  Gollan      Treadmill   MPH  1    Grade  0    Minutes  15   rest as needed   METs  1      NuStep   Level  1    SPM  80    Minutes  15    METs  1      Arm Ergometer   Level  1    RPM  25    Minutes  15    METs  1      Biostep-RELP   Level  1    SPM  50    Minutes  15      Prescription Details   Frequency (times per week)  3    Duration  Progress to 30 minutes of continuous aerobic without signs/symptoms of physical distress      Intensity   THRR 40-80% of Max Heartrate  121-150    Ratings of Perceived Exertion  11-15    Perceived Dyspnea  0-4      Resistance Training   Training Prescription  Yes    Weight  3 lb    Reps  10-15       Perform Capillary Blood Glucose checks as needed.  Exercise Prescription Changes: Exercise Prescription Changes    Row Name 04/06/19 1700 05/06/19 1100 07/02/19 1100 07/15/19 1400       Response to Exercise   Blood Pressure (Admit)  154/98  -  118/70  142/74    Blood Pressure (Exercise)  168/102  -  -  142/84  Blood Pressure (Exit)  132/84  -  124/68  140/82    Heart Rate (Admit)  99 bpm  -  93 bpm  83 bpm    Heart Rate (Exercise)  145 bpm  -  120 bpm  106 bpm    Heart Rate (Exit)  104  bpm  -  98 bpm  99 bpm    Oxygen Saturation (Admit)  96 %  -  96 %  98 %    Oxygen Saturation (Exercise)  95 %  -  96 %  98 %    Oxygen Saturation (Exit)  96 %  -  96 %  98 %    Rating of Perceived Exertion (Exercise)  14  -  16  14    Perceived Dyspnea (Exercise)  3  -  1  1    Symptoms  leg and back pain  -  -  leg and back pain    Comments  -  Reviewed Virtual Program  -  -    Duration  -  Progress to 10 minutes continuous walking  at current work load and total walking time to 30-45 min  Progress to 30 minutes of  aerobic without signs/symptoms of physical distress  Progress to 30 minutes of  aerobic without signs/symptoms of physical distress    Intensity  -  THRR unchanged  THRR unchanged  THRR unchanged      Progression   Progression  -  Continue to progress workloads to maintain intensity without signs/symptoms of physical distress.  Continue to progress workloads to maintain intensity without signs/symptoms of physical distress.  Continue to progress workloads to maintain intensity without signs/symptoms of physical distress.    Average METs  -  -  -  1.73      Resistance Training   Training Prescription  -  Yes  Yes  Yes    Weight  -  3 lb, ROM, body weight, bands  3 lb  3 lb    Reps  -  10-15  10-15  10-15      Interval Training   Interval Training  -  No  No  No      Treadmill   MPH  -  -  0.6  0.7    Grade  -  -  0  0    Minutes  -  -  15  15    METs  -  -  1  1.5      NuStep   Level  -  -  -  1    Minutes  -  -  -  15    METs  -  -  -  2      Recumbant Elliptical   Level  -  -  -  1    Minutes  -  -  -  15    METs  -  -  -  1.4      Biostep-RELP   Level  -  -  -  1    Minutes  -  -  -  15    METs  -  -  -  2      Track   Laps  -  -  -  -    Minutes  -  10 at home along with videos  -  -      Home Exercise Plan   Plans to continue exercise at  -  Home (comment) walking, videos  -  Home (comment) walking, videos    Frequency  -  Add 3 additional days to  program exercise sessions.  -  Add 3 additional days to program exercise sessions.    Initial Home Exercises Provided  -  05/06/19  -  05/06/19       Exercise Comments: Exercise Comments    Row Name 06/29/19 [phone]           Exercise Comments  First full day of exercise!  Patient was oriented to gym and equipment including functions, settings, policies, and procedures.  Patient's individual exercise prescription and treatment plan were reviewed.  All starting workloads were established based on the results of the 6 minute walk test done at initial orientation visit.  The plan for exercise progression was also introduced and progression will be customized based on patient's performance and goals.          Exercise Goals and Review: Exercise Goals    Row Name 04/06/19 1715             Exercise Goals   Increase Physical Activity  Yes       Intervention  Provide advice, education, support and counseling about physical activity/exercise needs.;Develop an individualized exercise prescription for aerobic and resistive training based on initial evaluation findings, risk stratification, comorbidities and participant's personal goals.       Expected Outcomes  Short Term: Attend rehab on a regular basis to increase amount of physical activity.;Long Term: Add in home exercise to make exercise part of routine and to increase amount of physical activity.;Long Term: Exercising regularly at least 3-5 days a week.       Increase Strength and Stamina  Yes       Intervention  Provide advice, education, support and counseling about physical activity/exercise needs.;Develop an individualized exercise prescription for aerobic and resistive training based on initial evaluation findings, risk stratification, comorbidities and participant's personal goals.       Expected Outcomes  Short Term: Increase workloads from initial exercise prescription for resistance, speed, and METs.;Short Term: Perform resistance  training exercises routinely during rehab and add in resistance training at home;Long Term: Improve cardiorespiratory fitness, muscular endurance and strength as measured by increased METs and functional capacity (6MWT)       Able to understand and use rate of perceived exertion (RPE) scale  Yes       Expected Outcomes  Short Term: Able to use RPE daily in rehab to express subjective intensity level;Long Term:  Able to use RPE to guide intensity level when exercising independently       Able to understand and use Dyspnea scale  Yes       Intervention  Provide education and explanation on how to use Dyspnea scale       Expected Outcomes  Short Term: Able to use Dyspnea scale daily in rehab to express subjective sense of shortness of breath during exertion;Long Term: Able to use Dyspnea scale to guide intensity level when exercising independently       Knowledge and understanding of Target Heart Rate Range (THRR)  Yes       Intervention  Provide education and explanation of THRR including how the numbers were predicted and where they are located for reference       Expected Outcomes  Short Term: Able to state/look up THRR;Short Term: Able to use daily as guideline for intensity in rehab;Long Term: Able to use THRR to govern intensity  when exercising independently       Able to check pulse independently  Yes       Intervention  Provide education and demonstration on how to check pulse in carotid and radial arteries.;Review the importance of being able to check your own pulse for safety during independent exercise       Expected Outcomes  Short Term: Able to explain why pulse checking is important during independent exercise;Long Term: Able to check pulse independently and accurately       Understanding of Exercise Prescription  Yes       Intervention  Provide education, explanation, and written materials on patient's individual exercise prescription       Expected Outcomes  Short Term: Able to explain  program exercise prescription;Long Term: Able to explain home exercise prescription to exercise independently          Exercise Goals Re-Evaluation : Exercise Goals Re-Evaluation    Row Name 04/22/19 1247 05/06/19 1151 05/27/19 1205 06/24/19 1146 06/29/19 0948     Exercise Goal Re-Evaluation   Exercise Goals Review  -  Increase Physical Activity;Increase Strength and Stamina;Able to understand and use rate of perceived exertion (RPE) scale;Able to understand and use Dyspnea scale;Knowledge and understanding of Target Heart Rate Range (THRR);Able to check pulse independently;Understanding of Exercise Prescription  Increase Physical Activity;Increase Strength and Stamina;Understanding of Exercise Prescription  Increase Physical Activity;Increase Strength and Stamina;Understanding of Exercise Prescription  Able to understand and use rate of perceived exertion (RPE) scale;Able to understand and use Dyspnea scale;Knowledge and understanding of Target Heart Rate Range (THRR);Understanding of Exercise Prescription   Comments  Not starting until January  Reviewed home exercise with pt today.  Pt plans to walk and use staff videos at home for exercise.  Reviewed THR, pulse, RPE, sign and symptoms, and when to call 911 or MD.  Also discussed weather considerations and indoor options.  Pt voiced understanding.  Evely is doing okay at home.  She had a lot of questions about her exercise. She was still not sure how to start going.  We talked about downloading [employer] to help prompt her to exercise her three days a week and to record her vitals.  Resent SMS for app. She is going to start with walking and/or staff videos.  We reviewed the order for videos (warm up, weights, cardio, stretch/cool down) and walking.  She feels more confident with how to get started now.  She is doing well with her exercise and has a renewed sense of motivation to get in shape and get moving again. She has a new commitment to exercise  and her overall wellbeing.  Reviewed RPE scale, THR and program prescription with pt today.  Pt voiced understanding and was given a copy of goals to take home.   Expected Outcomes  -  Short: Start to exercise at home for 10 min  Long: Work stamina up to 30  min of exercise.  Short: Start to exercise at home 10 min 3x week.  Long: Build up strength and stamina.  Short: Return to in-person rehab regularly.  Long: Continue to build up stamina.  Short: Use RPE daily to regulate intensity. Long: Follow program prescription in THR.   [place] Name 07/02/19 1146 07/15/19 1415           Exercise Goal Re-Evaluation   Exercise Goals Review  Increase Physical Activity;Increase Strength and Stamina;Able to understand and use rate of perceived exertion (RPE) scale;Able to understand and use Dyspnea scale;Knowledge  and understanding of Target Heart Rate Range (THRR);Able to check pulse independently;Understanding of Exercise Prescription  Increase Physical Activity;Increase Strength and Stamina;Understanding of Exercise Prescription      Comments  Norell is tolerating exercise well so far.  Staff will monitor progress.  Antoninette is doing well in rehab.  She is enjoying her classmates and starting to like her exercise.  She is up to 2 METs on the BioStep and NuStep.  We will continue to monitor her progress.      Expected Outcomes  Short :  attend consistently Long:  improve stamina  Short: Continue to push self in class Long: Continue to improve stamina.         Discharge Exercise Prescription (Final Exercise Prescription Changes): Exercise Prescription Changes - 07/15/19 1400      Response to Exercise   Blood Pressure (Admit)  142/74    Blood Pressure (Exercise)  142/84    Blood Pressure (Exit)  140/82    Heart Rate (Admit)  83 bpm    Heart Rate (Exercise)  106 bpm    Heart Rate (Exit)  99 bpm    Oxygen Saturation (Admit)  98 %    Oxygen Saturation (Exercise)  98 %    Oxygen Saturation (Exit)  98 %    Rating of  Perceived Exertion (Exercise)  14    Perceived Dyspnea (Exercise)  1    Symptoms  leg and back pain    Duration  Progress to 30 minutes of  aerobic without signs/symptoms of physical distress    Intensity  THRR unchanged      Progression   Progression  Continue to progress workloads to maintain intensity without signs/symptoms of physical distress.    Average METs  1.73      Resistance Training   Training Prescription  Yes    Weight  3 lb    Reps  10-15      Interval Training   Interval Training  No      Treadmill   MPH  0.7    Grade  0    Minutes  15    METs  1.5      NuStep   Level  1    Minutes  15    METs  2      Recumbant Elliptical   Level  1    Minutes  15    METs  1.4      Biostep-RELP   Level  1    Minutes  15    METs  2      Track   Laps  --      Home Exercise Plan   Plans to continue exercise at  Home (comment)   walking, videos   Frequency  Add 3 additional days to program exercise sessions.    Initial Home Exercises Provided  05/06/19       Nutrition:  Target Goals: Understanding of nutrition guidelines, daily intake of sodium <1547m, cholesterol <2082m calories 30% from fat and 7% or less from saturated fats, daily to have 5 or more servings of fruits and vegetables.  Biometrics: Pre Biometrics - 04/06/19 1717      Pre Biometrics   Height  '5\' 7"'  (1.702 m)    Weight  (!) 374 lb 12.8 oz (170 kg)    BMI (Calculated)  58.69    Single Leg Stand  1.5 seconds        Nutrition Therapy Plan and Nutrition Goals: Nutrition Therapy &  Goals - 05/04/19 1300      Nutrition Therapy   Diet  DM, HH, low Na    Drug/Food Interactions  Statins/Certain Fruits    Protein (specify units)  130g    Fiber  25 grams    Whole Grain Foods  3 servings    Saturated Fats  12 max. grams    Fruits and Vegetables  5 servings/day    Sodium  1.5 grams      Personal Nutrition Goals   Nutrition Goal  ST: add 1 fruit/vegetable serving LT: Would like to be under  165lb    Comments  Basaglar Long Lasting. Pt reports starting [email or web address] (lose 4% in six months). MyPlate method. 3 meals and 2-3 snacks. Pt reports wanting to meal prep. Discussed DM friendly eating, honoring hunger, expectations. Discussed some meal and snack ideas. will follow up in 2 weeks for more education and goals.      Intervention Plan   Intervention  Prescribe, educate and counsel regarding individualized specific dietary modifications aiming towards targeted core components such as weight, hypertension, lipid management, diabetes, heart failure and other comorbidities.;Nutrition handout(s) given to patient.    Expected Outcomes  Short Term Goal: Understand basic principles of dietary content, such as calories, fat, sodium, cholesterol and nutrients.;Short Term Goal: A plan has been developed with personal nutrition goals set during dietitian appointment.;Long Term Goal: Adherence to prescribed nutrition plan.       Nutrition Assessments:   Nutrition Goals Re-Evaluation: Nutrition Goals Re-Evaluation    Row Name 05/18/19 1302 07/21/19 1233           Goals   Nutrition Goal  ST: continue to not drink pop  LT: Would like to be under 165lb  ST: continue to not drink pop  LT: Would like to be under 165lb      Comment  Able to add 1 F/V a day: strawberries, blueberries, bananas, maderines, cucumber, salads (3x/week), spinach. Pt reports energy levels are good. Pt reports she is emotionally drained; recent loss, friend with new breast cancer diagnosis, and son is a "handful". Pt reports having a good support system. Pt reports not drinking pop - discussed other options for drinks so she doesnt get bored (hibiscus tea). Pt reports lost 8 lbs, still has strength. Pt would like to continue with pop as a goal as it has been hard for her.  Continue with current changes      Expected Outcome  ST: continue to not drink pop  LT: Would like to be under 165lb  ST: continue to not drink pop  LT: Would  like to be under 165lb         Nutrition Goals Discharge (Final Nutrition Goals Re-Evaluation): Nutrition Goals Re-Evaluation - 07/21/19 1233      Goals   Nutrition Goal  ST: continue to not drink pop  LT: Would like to be under 165lb    Comment  Continue with current changes    Expected Outcome  ST: continue to not drink pop  LT: Would like to be under 165lb       Psychosocial: Target Goals: Acknowledge presence or absence of significant depression and/or stress, maximize coping skills, provide positive support system. Participant is able to verbalize types and ability to use techniques and skills needed for reducing stress and depression.   Initial Review & Psychosocial Screening: Initial Psych Review & Screening - 07/06/19 0951      Initial Review   Current issues with  Current  Depression;Current Psychotropic Meds;History of Depression;Current Stress Concerns;Current Sleep Concerns    Source of Stress Concerns  Chronic Illness;Occupation;Family;Unable to perform yard/household activities    Comments  She is out of work with a sever autistic son. She is generally negative and is trying to be more positive. She talks to a therapist and takes medsication to help her cope better. She is determined to exercise, get weight off and be more positive.      Family Dynamics   Good Support System?  Yes    Comments  Patient sees a therapist and is taking medication to help support her. She is excited to exercise and be less short of breath.      Barriers   Psychosocial barriers to participate in program  The patient should benefit from training in stress management and relaxation.      Screening Interventions   Interventions  Encouraged to exercise;To provide support and resources with identified psychosocial needs;Provide feedback about the scores to participant    Expected Outcomes  Short Term goal: Utilizing psychosocial counselor, staff and physician to assist with identification of  specific Stressors or current issues interfering with healing process. Setting desired goal for each stressor or current issue identified.;Long Term Goal: Stressors or current issues are controlled or eliminated.;Short Term goal: Identification and review with participant of any Quality of Life or Depression concerns found by scoring the questionnaire.;Long Term goal: The participant improves quality of Life and PHQ9 Scores as seen by post scores and/or verbalization of changes       Quality of Life Scores:  Scores of 19 and below usually indicate a poorer quality of life in these areas.  A difference of  2-3 points is a clinically meaningful difference.  A difference of 2-3 points in the total score of the Quality of Life Index has been associated with significant improvement in overall quality of life, self-image, physical symptoms, and general health in studies assessing change in quality of life.  PHQ-9: Recent Review Flowsheet Data    Depression screen [facility] 2/9 07/06/2019 04/06/2019 03/30/2019 03/17/2018   Decreased Interest '2 1 3 1   ' Down, Depressed, Hopeless '2 2 1 2   ' PHQ - 2 Score '4 3 4 3   ' Altered sleeping '3 3 3 3   ' Tired, decreased energy '3 3 3 3   ' Change in appetite '2 3 3 3   ' Feeling bad or failure about yourself  '3 3 3 3   ' Trouble concentrating '3 3 3 3   ' Moving slowly or fidgety/restless '1 1 1 1   ' Suicidal thoughts 0 0 0 0   PHQ-9 Score '19 19 20 19   ' Difficult doing work/chores Extremely dIfficult Extremely dIfficult Extremely dIfficult Not difficult at all     Interpretation of Total Score  Total Score Depression Severity:  1-4 = Minimal depression, 5-9 = Mild depression, 10-14 = Moderate depression, 15-19 = Moderately severe depression, 20-27 = Severe depression   Psychosocial Evaluation and Intervention: Psychosocial Evaluation - 04/01/19 1427      Psychosocial Evaluation & Interventions   Interventions  Stress management education;Encouraged to exercise with the program  and follow exercise prescription    Comments  Shannah is coming to rehab for heart failure and SOB.  She wants to get stronger and improve her mobility and breathing.  She struggles with her sleeping and causes her fall asleep during the day and at work.  She has currently quit her job as she was not functioning.  She wants to be able to go and do with her kids and family.    Expected Outcomes  Short: Attend rehab regularly  Long: Improve overall stamina    Continue Psychosocial Services   Follow up required by staff       Psychosocial Re-Evaluation:   Psychosocial Discharge (Final Psychosocial Re-Evaluation):   Education: Education Goals: Education classes will be provided on a weekly basis, covering required topics. Participant will state understanding/return demonstration of topics presented.  Learning Barriers/Preferences: Learning Barriers/Preferences - 04/01/19 1414      Learning Barriers/Preferences   Learning Barriers  Sight   glasses   Learning Preferences  Skilled Demonstration;Video       Education Topics:  Initial Evaluation Education: - Verbal, written and demonstration of respiratory meds, oximetry and breathing techniques. Instruction on use of nebulizers and MDIs and importance of monitoring MDI activations.   General Nutrition Guidelines/Fats and Fiber: -Group instruction provided by verbal, written material, models and posters to present the general guidelines for heart healthy nutrition. Gives an explanation and review of dietary fats and fiber.   Controlling Sodium/Reading Food Labels: -Group verbal and written material supporting the discussion of sodium use in heart healthy nutrition. Review and explanation with models, verbal and written materials for utilization of the food label.   Exercise Physiology & General Exercise Guidelines: - Group verbal and written instruction with models to review the exercise physiology of the cardiovascular system and  associated critical values. Provides general exercise guidelines with specific guidelines to those with heart or lung disease.    Aerobic Exercise & Resistance Training: - Gives group verbal and written instruction on the various components of exercise. Focuses on aerobic and resistive training programs and the benefits of this training and how to safely progress through these programs.   Flexibility, Balance, Mind/Body Relaxation: Provides group verbal/written instruction on the benefits of flexibility and balance training, including mind/body exercise modes such as yoga, pilates and tai chi.  Demonstration and skill practice provided.   Stress and Anxiety: - Provides group verbal and written instruction about the health risks of elevated stress and causes of high stress.  Discuss the correlation between heart/lung disease and anxiety and treatment options. Review healthy ways to manage with stress and anxiety.   Depression: - Provides group verbal and written instruction on the correlation between heart/lung disease and depressed mood, treatment options, and the stigmas associated with seeking treatment.   Exercise & Equipment Safety: - Individual verbal instruction and demonstration of equipment use and safety with use of the equipment.   Pulmonary Rehab from 04/06/2019 in [facility] Cardiac and Pulmonary Rehab  Date  04/06/19  Educator  AS  Instruction Review Code  1- Verbalizes Understanding      Infection Prevention: - Provides verbal and written material to individual with discussion of infection control including proper hand washing and proper equipment cleaning during exercise session.   Pulmonary Rehab from 04/06/2019 in [facility] Cardiac and Pulmonary Rehab  Date  04/06/19  Educator  AS  Instruction Review Code  1- Verbalizes Understanding      Falls Prevention: - Provides verbal and written material to individual with discussion of falls prevention and safety.   Pulmonary Rehab  from 04/06/2019 in [facility] Cardiac and Pulmonary Rehab  Date  04/06/19  Educator  AS  Instruction Review Code  1- Verbalizes Understanding      Diabetes: - Individual verbal and written instruction to review signs/symptoms of diabetes, desired ranges of glucose level fasting, after  meals and with exercise. Advice that pre and post exercise glucose checks will be done for 3 sessions at entry of program.   Chronic Lung Diseases: - Group verbal and written instruction to review updates, respiratory medications, advancements in procedures and treatments. Discuss use of supplemental oxygen including available portable oxygen systems, continuous and intermittent flow rates, concentrators, personal use and safety guidelines. Review proper use of inhaler and spacers. Provide informative websites for self-education.    Energy Conservation: - Provide group verbal and written instruction for methods to conserve energy, plan and organize activities. Instruct on pacing techniques, use of adaptive equipment and posture/positioning to relieve shortness of breath.   Triggers and Exacerbations: - Group verbal and written instruction to review types of environmental triggers and ways to prevent exacerbations. Discuss weather changes, air quality and the benefits of nasal washing. Review warning signs and symptoms to help prevent infections. Discuss techniques for effective airway clearance, coughing, and vibrations.   AED/CPR: - Group verbal and written instruction with the use of models to demonstrate the basic use of the AED with the basic ABC's of resuscitation.   Anatomy and Physiology of the Lungs: - Group verbal and written instruction with the use of models to provide basic lung anatomy and physiology related to function, structure and complications of lung disease.   Anatomy & Physiology of the Heart: - Group verbal and written instruction and models provide basic cardiac anatomy and physiology,  with the coronary electrical and arterial systems. Review of Valvular disease and Heart Failure   Cardiac Medications: - Group verbal and written instruction to review commonly prescribed medications for heart disease. Reviews the medication, class of the drug, and side effects.   Know Your Numbers and Risk Factors: -Group verbal and written instruction about important numbers in your health.  Discussion of what are risk factors and how they play a role in the disease process.  Review of Cholesterol, Blood Pressure, Diabetes, and BMI and the role they play in your overall health.   Sleep Hygiene: -Provides group verbal and written instruction about how sleep can affect your health.  Define sleep hygiene, discuss sleep cycles and impact of sleep habits. Review good sleep hygiene tips.    Other: -Provides group and verbal instruction on various topics (see comments)    Knowledge Questionnaire Score: Knowledge Questionnaire Score - 04/06/19 1711      Knowledge Questionnaire Score   Pre Score  16/18        Core Components/Risk Factors/Patient Goals at Admission: Personal Goals and Risk Factors at Admission - 04/06/19 1714      Core Components/Risk Factors/Patient Goals on Admission    Weight Management  Yes    Intervention  Weight Management/Obesity: Establish reasonable short term and long term weight goals.    Admit Weight  374 lb 12.8 oz (170 kg)    Goal Weight: Short Term  365 lb (165.6 kg)    Goal Weight: Long Term  355 lb (161 kg)    Expected Outcomes  Short Term: Continue to assess and modify interventions until short term weight is achieved;Long Term: Adherence to nutrition and physical activity/exercise program aimed toward attainment of established weight goal;Weight Maintenance: Understanding of the daily nutrition guidelines, which includes 25-35% calories from fat, 7% or less cal from saturated fats, less than 259m cholesterol, less than 1.5gm of sodium, & 5 or more  servings of fruits and vegetables daily;Weight Loss: Understanding of general recommendations for a balanced deficit meal plan, which promotes  1-2 lb weight loss per week and includes a negative energy balance of [phone] kcal/d;Understanding recommendations for meals to include 15-35% energy as protein, 25-35% energy from fat, 35-60% energy from carbohydrates, less than 235m of dietary cholesterol, 20-35 gm of total fiber daily;Understanding of distribution of calorie intake throughout the day with the consumption of 4-5 meals/snacks;Weight Gain: Understanding of general recommendations for a high calorie, high protein meal plan that promotes weight gain by distributing calorie intake throughout the day with the consumption for 4-5 meals, snacks, and/or supplements       Core Components/Risk Factors/Patient Goals Review:  Goals and Risk Factor Review    Row Name 05/27/19 1205 06/24/19 1147           Core Components/Risk Factors/Patient Goals Review   Personal Goals Review  Weight Management/Obesity;Hypertension;Heart Failure  Weight Management/Obesity;Hypertension;Heart Failure      Review  She was discouraged today as her weight was up 2 lb and she was feeling more SOB than normal.  We talked about possible causes and she admitted to doing more at home the last few days (more than her normal) and thinks she has overdone it.  We talked about DOMS and how it can make you feel worse for a few days but it will improve.  This is also a symptoms of her heart failure so we talked about combatting that as well.  She is going to give it another day to see if she improves and move a little more today to help release soreness and get fluid moving out.  She has not been checking her blood pressures routinely but will start to get into that habit since the app will remind her to track it.  She is eager to get moving and to start feeling better again.  Her weight has been going up and down and she wants to lose  the weight for good.  She is now committed to coming to in person rehab.      Expected Outcomes  Short: Watch weight closely with heart failure.  Long: Manage heart failure  Short: Work on weight loss.  Long: Continue to manage heart failure.         Core Components/Risk Factors/Patient Goals at Discharge (Final Review):  Goals and Risk Factor Review - 06/24/19 1147      Core Components/Risk Factors/Patient Goals Review   Personal Goals Review  Weight Management/Obesity;Hypertension;Heart Failure    Review  Her weight has been going up and down and she wants to lose the weight for good.  She is now committed to coming to in person rehab.    Expected Outcomes  Short: Work on weight loss.  Long: Continue to manage heart failure.       ITP Comments: ITP Comments    Row Name 04/01/19 1440 04/06/19 1718 04/14/19 1420 04/22/19 1246 04/29/19 0927   ITP Comments  Completed virtual orientation today.  [facility]  EP eval scheduled for 12/7 at 3pm.  Completed 6MWT.  Initital ITP created and sent to Dr MDortha Schwalbecalled to day and wants to wait until after the first of the year to reconsider the program.  She is concerned about the number of people at the main entrance to the hospital, valet, etc.  She felt very safe in the Rehab area.  LGeanwill be out until after the new year.  30 day review competed . ITP sent to Dr MEmily Filbertfor  review, changes as needed and ITP approval signature   Row Name 05/04/19 1340 05/06/19 1148 05/18/19 1335 05/27/19 1205 06/24/19 1146   ITP Comments  Completed initial RD eval  Completed virtual appt today.  Reviewed Virtual Home Exercise Guidelines and program.  Infantof wants to do the fully virtual program while COVID-19 is still high as she is not comfortable coming into the hospital at this time.  Once things calm down, she would like to do the in person program.  Enrolled in [employer].  Completed 2 week f/u   Completed virtual visit today.  Next follow up scheduled with Melissa on 2/1 and me on 2/10.  She would like to see if she can change to a Thurs with Melissa so it's not as long between calls.  Completed virtual follow up today.  Elsia is ready to return to in person rehab.  She is scheduled to restart next week.  We will do a walk test and new ITP on her first visit.   [place] Name 06/29/19 0948 07/22/19 1044         ITP Comments  First full day of exercise!  Patient was oriented to gym and equipment including functions, settings, policies, and procedures.  Patient's individual exercise prescription and treatment plan were reviewed.  All starting workloads were established based on the results of the 6 minute walk test done at initial orientation visit.  The plan for exercise progression was also introduced and progression will be customized based on patient's performance and goals.  30 day chart review completed. ITP sent to Dr Zachery Dakins Medical Director, for review,changes as needed and signature.         Comments:

## 2019-07-22 NOTE — Progress Notes (Signed)
Daily Session Note  Patient Details  Name: Brandy [name] MRN: [id number] Date of Birth: 10-03-1965 Referring Provider:     Pulmonary Rehab from 04/06/2019 in [facility] Cardiac and Pulmonary Rehab  Referring Provider  Gollan      Encounter Date: 07/22/2019  Check In: Session Check In - 07/22/19 1011      Check-In   Supervising physician immediately available to respond to emergencies  See telemetry face sheet for immediately available ER MD    Location  [facility]-Cardiac & Pulmonary Rehab    Staff Present  Renita Papa, RN BSN;Joseph Foy [place], [place], ACSM CEP, Exercise Physiologist    Virtual Visit  No    Medication changes reported      No    Fall or balance concerns reported     No    Warm-up and Cool-down  Performed on first and last piece of equipment    Resistance Training Performed  Yes    VAD Patient?  No    PAD/SET Patient?  No      Pain Assessment   Currently in Pain?  No/denies          Social History   Tobacco Use  Smoking Status Never Smoker  Smokeless Tobacco Never Used    Goals Met:  Independence with exercise equipment Exercise tolerated well No report of cardiac concerns or symptoms Strength training completed today  Goals Unmet:  Not Applicable  Comments: Pt able to follow exercise prescription today without complaint.  Will continue to monitor for progression.    Dr. Emily Filbert is Medical Director for [place] and [facility].

## 2019-07-23 ENCOUNTER — Ambulatory Visit (INDEPENDENT_AMBULATORY_CARE_PROVIDER_SITE_OTHER): Payer: BC Managed Care – PPO | Admitting: Psychology

## 2019-07-23 ENCOUNTER — Other Ambulatory Visit: Payer: Self-pay

## 2019-07-23 ENCOUNTER — Ambulatory Visit: Payer: BC Managed Care – PPO

## 2019-07-23 ENCOUNTER — Ambulatory Visit (HOSPITAL_COMMUNITY)
Admission: RE | Admit: 2019-07-23 | Discharge: 2019-07-23 | Disposition: A | Discharge status: Home / Self Care | Payer: BC Managed Care – PPO | Source: Ambulatory Visit | Attending: Obstetrics and Gynecology | Admitting: Obstetrics and Gynecology

## 2019-07-23 DIAGNOSIS — F3289 Other specified depressive episodes: Secondary | ICD-10-CM | POA: Diagnosis not present

## 2019-07-23 DIAGNOSIS — N950 Postmenopausal bleeding: Secondary | ICD-10-CM | POA: Insufficient documentation

## 2019-07-23 DIAGNOSIS — M545 Low back pain: Secondary | ICD-10-CM | POA: Diagnosis not present

## 2019-07-23 DIAGNOSIS — M25512 Pain in left shoulder: Secondary | ICD-10-CM | POA: Diagnosis not present

## 2019-07-23 DIAGNOSIS — M79604 Pain in right leg: Secondary | ICD-10-CM | POA: Diagnosis not present

## 2019-07-23 DIAGNOSIS — M79605 Pain in left leg: Secondary | ICD-10-CM | POA: Diagnosis not present

## 2019-07-24 ENCOUNTER — Encounter: Payer: BC Managed Care – PPO | Admitting: *Deleted

## 2019-07-24 ENCOUNTER — Ambulatory Visit
Admission: RE | Admit: 2019-07-24 | Discharge: 2019-07-24 | Disposition: A | Discharge status: Home / Self Care | Payer: BC Managed Care – PPO | Source: Ambulatory Visit | Attending: Primary Care | Admitting: Primary Care

## 2019-07-24 DIAGNOSIS — R0600 Dyspnea, unspecified: Secondary | ICD-10-CM

## 2019-07-24 DIAGNOSIS — M79604 Pain in right leg: Secondary | ICD-10-CM

## 2019-07-24 DIAGNOSIS — Z713 Dietary counseling and surveillance: Secondary | ICD-10-CM | POA: Diagnosis not present

## 2019-07-24 DIAGNOSIS — Z6841 Body Mass Index (BMI) 40.0 and over, adult: Secondary | ICD-10-CM | POA: Diagnosis not present

## 2019-07-24 DIAGNOSIS — R29898 Other symptoms and signs involving the musculoskeletal system: Secondary | ICD-10-CM

## 2019-07-24 DIAGNOSIS — M79605 Pain in left leg: Secondary | ICD-10-CM

## 2019-07-24 DIAGNOSIS — Z1231 Encounter for screening mammogram for malignant neoplasm of breast: Secondary | ICD-10-CM | POA: Diagnosis not present

## 2019-07-24 DIAGNOSIS — I509 Heart failure, unspecified: Secondary | ICD-10-CM | POA: Diagnosis not present

## 2019-07-24 DIAGNOSIS — I5032 Chronic diastolic (congestive) heart failure: Secondary | ICD-10-CM

## 2019-07-24 DIAGNOSIS — G8929 Other chronic pain: Secondary | ICD-10-CM

## 2019-07-24 DIAGNOSIS — I872 Venous insufficiency (chronic) (peripheral): Secondary | ICD-10-CM | POA: Diagnosis not present

## 2019-07-24 DIAGNOSIS — E1159 Type 2 diabetes mellitus with other circulatory complications: Secondary | ICD-10-CM | POA: Diagnosis not present

## 2019-07-24 DIAGNOSIS — R0609 Other forms of dyspnea: Secondary | ICD-10-CM

## 2019-07-24 NOTE — Progress Notes (Signed)
Daily Session Note  Patient Details  Name: Brandy [name] MRN: [id number] Date of Birth: Jun 06, 1965 Referring Provider:     Pulmonary Rehab from 04/06/2019 in [facility] Cardiac and Pulmonary Rehab  Referring Provider  Gollan      Encounter Date: 07/24/2019  Check In: Session Check In - 07/24/19 0942      Check-In   Supervising physician immediately available to respond to emergencies  See telemetry face sheet for immediately available ER MD    Location  [facility]-Cardiac & Pulmonary Rehab    Staff Present  Renita Papa, RN BSN;Joseph [address] [place], [place], [place], [place], CCET    Virtual Visit  No    Medication changes reported      No    Fall or balance concerns reported     No    Warm-up and Cool-down  Performed on first and last piece of equipment    Resistance Training Performed  Yes    VAD Patient?  No    PAD/SET Patient?  No      Pain Assessment   Currently in Pain?  No/denies          Social History   Tobacco Use  Smoking Status Never Smoker  Smokeless Tobacco Never Used    Goals Met:  Independence with exercise equipment Exercise tolerated well No report of cardiac concerns or symptoms Strength training completed today  Goals Unmet:  Not Applicable  Comments: Pt able to follow exercise prescription today without complaint.  Will continue to monitor for progression.    Dr. Emily Filbert is Medical Director for [facility] and [facility].

## 2019-07-26 ENCOUNTER — Other Ambulatory Visit: Payer: Self-pay | Admitting: Internal Medicine

## 2019-07-27 ENCOUNTER — Other Ambulatory Visit: Payer: Self-pay

## 2019-07-27 ENCOUNTER — Ambulatory Visit (INDEPENDENT_AMBULATORY_CARE_PROVIDER_SITE_OTHER): Payer: BC Managed Care – PPO | Admitting: Neurology

## 2019-07-27 ENCOUNTER — Ambulatory Visit: Payer: BC Managed Care – PPO | Admitting: Neurology

## 2019-07-27 ENCOUNTER — Telehealth: Payer: Self-pay | Admitting: Obstetrics and Gynecology

## 2019-07-27 ENCOUNTER — Encounter: Payer: BC Managed Care – PPO | Admitting: *Deleted

## 2019-07-27 DIAGNOSIS — R0600 Dyspnea, unspecified: Secondary | ICD-10-CM | POA: Diagnosis not present

## 2019-07-27 DIAGNOSIS — E662 Morbid (severe) obesity with alveolar hypoventilation: Secondary | ICD-10-CM

## 2019-07-27 DIAGNOSIS — Z6841 Body Mass Index (BMI) 40.0 and over, adult: Secondary | ICD-10-CM

## 2019-07-27 DIAGNOSIS — G4733 Obstructive sleep apnea (adult) (pediatric): Secondary | ICD-10-CM | POA: Diagnosis not present

## 2019-07-27 DIAGNOSIS — G4719 Other hypersomnia: Secondary | ICD-10-CM

## 2019-07-27 DIAGNOSIS — I509 Heart failure, unspecified: Secondary | ICD-10-CM | POA: Diagnosis not present

## 2019-07-27 DIAGNOSIS — R0609 Other forms of dyspnea: Secondary | ICD-10-CM

## 2019-07-27 DIAGNOSIS — I872 Venous insufficiency (chronic) (peripheral): Secondary | ICD-10-CM | POA: Diagnosis not present

## 2019-07-27 DIAGNOSIS — R5382 Chronic fatigue, unspecified: Secondary | ICD-10-CM

## 2019-07-27 DIAGNOSIS — I5031 Acute diastolic (congestive) heart failure: Secondary | ICD-10-CM

## 2019-07-27 DIAGNOSIS — F321 Major depressive disorder, single episode, moderate: Secondary | ICD-10-CM

## 2019-07-27 DIAGNOSIS — E1159 Type 2 diabetes mellitus with other circulatory complications: Secondary | ICD-10-CM | POA: Diagnosis not present

## 2019-07-27 DIAGNOSIS — E1140 Type 2 diabetes mellitus with diabetic neuropathy, unspecified: Secondary | ICD-10-CM

## 2019-07-27 DIAGNOSIS — Z713 Dietary counseling and surveillance: Secondary | ICD-10-CM | POA: Diagnosis not present

## 2019-07-27 DIAGNOSIS — I5032 Chronic diastolic (congestive) heart failure: Secondary | ICD-10-CM

## 2019-07-27 NOTE — Progress Notes (Signed)
Daily Session Note  Patient Details  Name: Brandy [name] MRN: [id number] Date of Birth: 07/04/1965 Referring Provider:     Pulmonary Rehab from 04/06/2019 in [facility] Cardiac and Pulmonary Rehab  Referring Provider  Gollan      Encounter Date: 07/27/2019  Check In: Session Check In - 07/27/19 1000      Check-In   Supervising physician immediately available to respond to emergencies  See telemetry face sheet for immediately available ER MD    Location  [facility]-Cardiac & Pulmonary Rehab    Staff Present  Renita Papa, RN BSN;Joseph [address] [place], [place], ACSM CEP, Exercise Physiologist    Virtual Visit  No    Medication changes reported      No    Fall or balance concerns reported     No    Warm-up and Cool-down  Performed on first and last piece of equipment    Resistance Training Performed  Yes    VAD Patient?  No    PAD/SET Patient?  No      Pain Assessment   Currently in Pain?  No/denies          Social History   Tobacco Use  Smoking Status Never Smoker  Smokeless Tobacco Never Used    Goals Met:  Independence with exercise equipment Exercise tolerated well No report of cardiac concerns or symptoms Strength training completed today  Goals Unmet:  Not Applicable  Comments: Pt able to follow exercise prescription today without complaint.  Will continue to monitor for progression.    Dr. Emily Filbert is Medical Director for [facility] and [facility].

## 2019-07-27 NOTE — Telephone Encounter (Signed)
GYN Telephone Note D/w [name] re: the u/s and results and everything looks good, but given the cramping and amount of bleeding she had, I told her I recommend doing a hysteroscopy, d&c to fully evaluate the uterine cavity, which she is amenable to. She has no current bleeding after the biopsy procedure.  Cornelia Copa MD Attending Center for [employer] (Faculty Practice) 07/27/2019 Time: 1110am

## 2019-07-28 ENCOUNTER — Ambulatory Visit: Payer: BC Managed Care – PPO

## 2019-07-28 ENCOUNTER — Telehealth: Payer: BC Managed Care – PPO | Admitting: Obstetrics and Gynecology

## 2019-07-29 ENCOUNTER — Other Ambulatory Visit: Payer: Self-pay

## 2019-07-29 ENCOUNTER — Encounter: Payer: BC Managed Care – PPO | Admitting: *Deleted

## 2019-07-29 ENCOUNTER — Ambulatory Visit: Payer: BC Managed Care – PPO

## 2019-07-29 DIAGNOSIS — Z713 Dietary counseling and surveillance: Secondary | ICD-10-CM | POA: Diagnosis not present

## 2019-07-29 DIAGNOSIS — I509 Heart failure, unspecified: Secondary | ICD-10-CM | POA: Diagnosis not present

## 2019-07-29 DIAGNOSIS — M5417 Radiculopathy, lumbosacral region: Secondary | ICD-10-CM | POA: Diagnosis not present

## 2019-07-29 DIAGNOSIS — M6281 Muscle weakness (generalized): Secondary | ICD-10-CM | POA: Diagnosis not present

## 2019-07-29 DIAGNOSIS — M62838 Other muscle spasm: Secondary | ICD-10-CM | POA: Diagnosis not present

## 2019-07-29 DIAGNOSIS — R0609 Other forms of dyspnea: Secondary | ICD-10-CM

## 2019-07-29 DIAGNOSIS — M545 Low back pain: Secondary | ICD-10-CM | POA: Diagnosis not present

## 2019-07-29 DIAGNOSIS — R0600 Dyspnea, unspecified: Secondary | ICD-10-CM

## 2019-07-29 DIAGNOSIS — I872 Venous insufficiency (chronic) (peripheral): Secondary | ICD-10-CM | POA: Diagnosis not present

## 2019-07-29 DIAGNOSIS — E1159 Type 2 diabetes mellitus with other circulatory complications: Secondary | ICD-10-CM | POA: Diagnosis not present

## 2019-07-29 DIAGNOSIS — Z6841 Body Mass Index (BMI) 40.0 and over, adult: Secondary | ICD-10-CM | POA: Diagnosis not present

## 2019-07-29 DIAGNOSIS — I5032 Chronic diastolic (congestive) heart failure: Secondary | ICD-10-CM

## 2019-07-29 NOTE — Progress Notes (Signed)
Daily Session Note  Patient Details  Name: Brandy [name] MRN: [id number] Date of Birth: [birth year] Referring Provider:     Pulmonary Rehab from 04/06/2019 in [facility] Cardiac and Pulmonary Rehab  Referring Provider  Gollan      Encounter Date: 07/29/2019  Check In: Session Check In - 07/29/19 [place]      Check-In   Supervising physician immediately available to respond to emergencies  See telemetry face sheet for immediately available ER MD    Location  [facility]-Cardiac & Pulmonary Rehab    Staff Present  Hope Budds RDN, LDN;Joseph Hood RCP,RRT,BSRT;Heath Lark, RN, BSN, CCRP    Virtual Visit  No    Medication changes reported      No    Fall or balance concerns reported     No    Warm-up and Cool-down  Performed on first and last piece of equipment    Resistance Training Performed  Yes    VAD Patient?  No    PAD/SET Patient?  No      Pain Assessment   Currently in Pain?  No/denies          Social History   Tobacco Use  Smoking Status Never Smoker  Smokeless Tobacco Never Used    Goals Met:  Proper associated with RPD/PD & O2 Sat Independence with exercise equipment Exercise tolerated well No report of cardiac concerns or symptoms  Goals Unmet:  Not Applicable  Comments: Pt able to follow exercise prescription today without complaint.  Will continue to monitor for progression.    Dr. Emily Filbert is Medical Director for [place] and [facility].

## 2019-07-30 ENCOUNTER — Ambulatory Visit: Payer: BC Managed Care – PPO

## 2019-07-30 ENCOUNTER — Telehealth: Payer: Self-pay | Admitting: Primary Care

## 2019-07-30 ENCOUNTER — Ambulatory Visit (INDEPENDENT_AMBULATORY_CARE_PROVIDER_SITE_OTHER): Payer: BC Managed Care – PPO | Admitting: Psychology

## 2019-07-30 DIAGNOSIS — F3289 Other specified depressive episodes: Secondary | ICD-10-CM

## 2019-07-30 DIAGNOSIS — M545 Low back pain: Secondary | ICD-10-CM | POA: Diagnosis not present

## 2019-07-30 DIAGNOSIS — M79604 Pain in right leg: Secondary | ICD-10-CM | POA: Diagnosis not present

## 2019-07-30 DIAGNOSIS — M79605 Pain in left leg: Secondary | ICD-10-CM | POA: Diagnosis not present

## 2019-07-30 DIAGNOSIS — M25512 Pain in left shoulder: Secondary | ICD-10-CM | POA: Diagnosis not present

## 2019-07-30 NOTE — Telephone Encounter (Signed)
[name] with Dr Durene Fruits called today.  He stated Dr would like a peer to peer conversation about the patient  HE stated that the Dr Would like to discuss any limitations you may have on the patient .   He asked for a call back at your convenience Office number provided by Trinna Post, stated to ask for Dr when you call and they will get you connected  [phone]

## 2019-07-30 NOTE — Telephone Encounter (Signed)
Spoke with Trinna Post, he will have the physician call back on Tuesday April 6th at 1 pm.

## 2019-08-03 ENCOUNTER — Encounter: Payer: BC Managed Care – PPO | Attending: Cardiovascular Disease | Admitting: *Deleted

## 2019-08-03 ENCOUNTER — Other Ambulatory Visit: Payer: Self-pay

## 2019-08-03 DIAGNOSIS — Z713 Dietary counseling and surveillance: Secondary | ICD-10-CM | POA: Diagnosis not present

## 2019-08-03 DIAGNOSIS — E1159 Type 2 diabetes mellitus with other circulatory complications: Secondary | ICD-10-CM | POA: Diagnosis not present

## 2019-08-03 DIAGNOSIS — R0609 Other forms of dyspnea: Secondary | ICD-10-CM

## 2019-08-03 DIAGNOSIS — I872 Venous insufficiency (chronic) (peripheral): Secondary | ICD-10-CM | POA: Diagnosis not present

## 2019-08-03 DIAGNOSIS — I509 Heart failure, unspecified: Secondary | ICD-10-CM | POA: Insufficient documentation

## 2019-08-03 DIAGNOSIS — Z6841 Body Mass Index (BMI) 40.0 and over, adult: Secondary | ICD-10-CM | POA: Insufficient documentation

## 2019-08-03 DIAGNOSIS — R0600 Dyspnea, unspecified: Secondary | ICD-10-CM | POA: Diagnosis not present

## 2019-08-03 DIAGNOSIS — I5032 Chronic diastolic (congestive) heart failure: Secondary | ICD-10-CM

## 2019-08-03 NOTE — Progress Notes (Signed)
Daily Session Note  Patient Details  Name: Brandy [name] MRN: [id number] Date of Birth: 06/03/1965 Referring Provider:     Pulmonary Rehab from 04/06/2019 in [facility]  Referring Provider  Gollan      Encounter Date: 08/03/2019  Check In: Session Check In - 08/03/19 1002      Check-In   Supervising physician immediately available to respond to emergencies  See telemetry face sheet for immediately available ER MD    Location  [facility]    Staff Present  Susanne Bice, RN, BSN, CCRP;Kelly Hayes, BS, ACSM CEP, Exercise Physiologist;Joseph Hood RCP,RRT,BSRT    Virtual Visit  No    Medication changes reported      No    Fall or balance concerns reported     No    Warm-up and Cool-down  Performed on first and last piece of equipment    Resistance Training Performed  Yes    VAD Patient?  No    PAD/SET Patient?  No      Pain Assessment   Currently in Pain?  No/denies          Social History   Tobacco Use  Smoking Status Never Smoker  Smokeless Tobacco Never Used    Goals Met:  Proper associated with RPD/PD & O2 Sat Independence with exercise equipment Exercise tolerated well No report of cardiac concerns or symptoms  Goals Unmet:  Not Applicable  Comments: Pt able to follow exercise prescription today without complaint.  Will continue to monitor for progression.    Dr. Mark Miller is Medical Director for [facility] and [facility]. 

## 2019-08-04 ENCOUNTER — Ambulatory Visit: Payer: BC Managed Care – PPO

## 2019-08-05 ENCOUNTER — Ambulatory Visit: Payer: BC Managed Care – PPO

## 2019-08-05 DIAGNOSIS — M6281 Muscle weakness (generalized): Secondary | ICD-10-CM | POA: Diagnosis not present

## 2019-08-05 DIAGNOSIS — M545 Low back pain: Secondary | ICD-10-CM | POA: Diagnosis not present

## 2019-08-05 DIAGNOSIS — M62838 Other muscle spasm: Secondary | ICD-10-CM | POA: Diagnosis not present

## 2019-08-05 DIAGNOSIS — M5417 Radiculopathy, lumbosacral region: Secondary | ICD-10-CM | POA: Diagnosis not present

## 2019-08-06 ENCOUNTER — Ambulatory Visit: Payer: BC Managed Care – PPO

## 2019-08-06 ENCOUNTER — Ambulatory Visit (INDEPENDENT_AMBULATORY_CARE_PROVIDER_SITE_OTHER): Payer: BC Managed Care – PPO | Admitting: Psychology

## 2019-08-06 DIAGNOSIS — M79605 Pain in left leg: Secondary | ICD-10-CM | POA: Diagnosis not present

## 2019-08-06 DIAGNOSIS — M25512 Pain in left shoulder: Secondary | ICD-10-CM | POA: Diagnosis not present

## 2019-08-06 DIAGNOSIS — M79604 Pain in right leg: Secondary | ICD-10-CM | POA: Diagnosis not present

## 2019-08-06 DIAGNOSIS — F3289 Other specified depressive episodes: Secondary | ICD-10-CM

## 2019-08-06 DIAGNOSIS — M545 Low back pain: Secondary | ICD-10-CM | POA: Diagnosis not present

## 2019-08-07 ENCOUNTER — Other Ambulatory Visit: Payer: Self-pay

## 2019-08-07 ENCOUNTER — Telehealth: Payer: Self-pay | Admitting: *Deleted

## 2019-08-07 ENCOUNTER — Encounter: Payer: BC Managed Care – PPO | Admitting: *Deleted

## 2019-08-07 DIAGNOSIS — Z6841 Body Mass Index (BMI) 40.0 and over, adult: Secondary | ICD-10-CM | POA: Diagnosis not present

## 2019-08-07 DIAGNOSIS — I509 Heart failure, unspecified: Secondary | ICD-10-CM | POA: Diagnosis not present

## 2019-08-07 DIAGNOSIS — Z713 Dietary counseling and surveillance: Secondary | ICD-10-CM | POA: Diagnosis not present

## 2019-08-07 DIAGNOSIS — I5031 Acute diastolic (congestive) heart failure: Secondary | ICD-10-CM | POA: Insufficient documentation

## 2019-08-07 DIAGNOSIS — R0600 Dyspnea, unspecified: Secondary | ICD-10-CM | POA: Diagnosis not present

## 2019-08-07 DIAGNOSIS — E1159 Type 2 diabetes mellitus with other circulatory complications: Secondary | ICD-10-CM | POA: Diagnosis not present

## 2019-08-07 DIAGNOSIS — I5032 Chronic diastolic (congestive) heart failure: Secondary | ICD-10-CM

## 2019-08-07 DIAGNOSIS — G4719 Other hypersomnia: Secondary | ICD-10-CM | POA: Insufficient documentation

## 2019-08-07 DIAGNOSIS — I872 Venous insufficiency (chronic) (peripheral): Secondary | ICD-10-CM | POA: Diagnosis not present

## 2019-08-07 NOTE — Procedures (Signed)
PATIENT'S NAME:  Brandy [name], Brandy [name] DOB:      [birth year]      MR#:    [id number]     DATE OF RECORDING: 07/27/2019 REFERRING M.D.:  Alma Friendly, NP Study Performed:   Baseline Polysomnogram HISTORY:  Chief concern according to patient: Brandy [name] has reported difficulties with multitasking, her level of alertness, her ability to concentrate and finding words is described delayed at least.  This seems to have gone on since the latter half of 2019.   Her recent referral describes her as a 54 year old female with morbid obesity, chronic pain, diabetes poorly controlled, hyperlipidemia, and new onset of recent onset of short-term memory difficulties and excessive daytime sleepiness.  She has been evaluated by neurosurgery (not sure which physician has seen her )  a CT of the head was completed showing some frontal atrophy, interestingly no EMG or nerve conduction study was obtained.  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   She also reports twitching or jerking movements in her lower extremities and pain when she stands- Diabetic neuropathy is managed by PCP, and cannot be part of my sleep clinic focus.   She has also diagnosis of chronic vitamin D deficiency despite supplement intake.  Her fatigue has not improved since she is taking vitamin D.  Her sleepiness has slightly improved as she is not taking quite as many naps and as 7 in onset as she used to take last year.  Her husband is very concerned about these abrupt starts of sleepiness attacks.  A rheumatological work-up is pending at this time.  " I had a sleep study years ago- 03/2018 (?) and was sleepy -but no explanation was found. This was an in -lab-study at [facility]" She was unable to comply with her pulmonologist request to track her sleep through apps. Naps are taken infrequently, lasting from 2-3 h and are more refreshing than nocturnal sleep.    Sleep relevant medical history: Nocturia 4 times,  dreams but has not acted dreams out.  Cervical spine DDD, and pain.  " My body is pain "   Family medical /sleep history: no other family member on CPAP with OSA, insomnia, no sleepwalkers.  her son has a son with severe autism spectrum disorder.    The patient endorsed the Epworth Sleepiness Scale at 13/24 points.   The patient's weight 381 pounds with a height of 67 (inches), resulting in a BMI of 59.9 kg/m2. The patient's neck circumference measured 18.5 inches.  CURRENT MEDICATIONS: Abilify, Aspirin, Lipitor, Celebrex, Zyrtec, Vitamin D3, Celexa, Vitamin B12, Cymbalta, Folvite, Neurontin, Glucotrol, Zestoretic, Glucophage, Multivitamin, Desyrel, Drisdol.   PROCEDURE:  This is a multichannel digital polysomnogram utilizing the Somnostar 11.2 system.  Electrodes and sensors were applied and monitored per AASM Specifications.   EEG, EOG, Chin and Limb EMG, were sampled at 200 Hz.  ECG, Snore and Nasal Pressure, Thermal Airflow, Respiratory Effort, CPAP Flow and Pressure, Oximetry was sampled at 50 Hz. Digital video and audio were recorded.          BASELINE STUDY:  Lights Out was at 21:08 and Lights On at 04:58.  Total recording time (TRT) was 470.5 minutes, with a total sleep time (TST) of 358.5 minutes.  The patient's sleep latency was 33 minutes. REM latency was 243.5 minutes.  The sleep efficiency was 76.2 %.     SLEEP ARCHITECTURE: WASO (Wake after sleep onset) was 96 minutes.  There were 21 minutes in Stage N1,  334.5 minutes Stage N2, 0 minutes Stage N3 and 3 minutes in Stage REM.  The percentage of Stage N1 was 5.9%, Stage N2 was 93.3%, Stage N3 was 0% and Stage R (REM sleep) was .8%.   RESPIRATORY ANALYSIS:  There were a total of 210 respiratory events:  40 obstructive apneas, 9 central apneas and 0 mixed apneas with a 161 hypopneas. The patient also had 21 respiratory event related arousals (RERAs).      The total APNEA/HYPOPNEA INDEX (AHI) was 35.1/hour and the total RESPIRATORY  DISTURBANCE INDEX was  47.1 /hour.  0 events occurred in REM sleep and 331 events in NREM. The REM AHI was  0 /hour, versus a non-REM AHI of 35.4. The patient spent 127 minutes of total sleep time in the supine position and 232 minutes in non-supine. The supine AHI was 29.3 versus a non-supine AHI of 38.4/h.  OXYGEN SATURATION & C02:  The Wake baseline 02 saturation was 95%, with the lowest being 90%. Time spent below 89% saturation equaled 0 minutes. The arousals were noted as: 95 were spontaneous, 0 were associated with PLMs, 71 were associated with respiratory events. The patient had a total of 0 Periodic Limb Movements.  Audio and video analysis did not show any abnormal or unusual movements, behaviors, phonations or vocalizations.  The patient had very little REM sleep and woke out off REM sleep.  The patient took only one bathroom break. Severe Snoring was noted. EKG was in keeping with normal sinus rhythm (NSR). The arousals were noted as: 95 were spontaneous, 0 were associated with PLMs, 78 were associated with respiratory events.  IMPRESSION: 1. Severe Obstructive Sleep Apnea (OSA), Severe Snoring and no oxygen desaturation of duration.  2. Loud Snoring 3. normal EKG  RECOMMENDATIONS:  1. Advise full-night, attended, CPAP titration study to optimize therapy. In spite of the very high BMI, there is no evidence of obesity hypoventilation.   I certify that I have reviewed the entire raw data recording prior to the issuance of this report in accordance with the Standards of Accreditation of the American Academy of Sleep Medicine (AASM)   Melvyn Novas, MD Diplomat, American Board of Psychiatry and Neurology  Diplomat, American Board of Sleep Medicine Wellsite geologist, [facility] [employer]

## 2019-08-07 NOTE — Telephone Encounter (Signed)
Called pt to inform her to call Shawna Clamp office to schedule her pre op clearance visit. Pt will call.

## 2019-08-07 NOTE — Addendum Note (Signed)
Addended by: Melvyn Novas on: 08/07/2019 10:04 AM   Modules accepted: Orders

## 2019-08-07 NOTE — Telephone Encounter (Signed)
-----   Message from [place] Bing, MD sent at 08/06/2019  4:45 PM EDT ----- Regarding: FW: pre op clearance for a hysteroscopy, d&c Please call her and let her know to call Natalia Leatherwood clark's office to schedule a pre op clearance with her. Thanks! ----- Message ----- From: Doreene Nest, NP Sent: 08/06/2019   8:26 AM EDT To: [place] Bing, MD Subject: RE: pre op clearance for a hysteroscopy, d&c   Sounds good! Yes, please have her call to schedule a pre op clearance appointment.  Thanks! Jae Dire ----- Message ----- From: [place] Bing, MD Sent: 08/04/2019   1:16 PM EDT To: Doreene Nest, NP Subject: RE: pre op clearance for a hysteroscopy, d&c   Thanks, that's great. There's not a particularly form that you have to fill out. Just a regular pre operative visit and progress note should be fine. Thanks so much  PS: do you want me to tell her to call your office for the appointment? Thanks -Charlie ----- Message ----- From: Doreene Nest, NP Sent: 07/27/2019  12:57 PM EDT To: [place] Bing, MD Subject: RE: pre op clearance for a hysteroscopy, d&c   Hi Charlie,  I could certainly clear her now as I see her every three months. Feel free to fax over the form and I'll get this done.  Have a great week! Jae Dire ----- Message ----- From:  Bing, MD Sent: 07/27/2019  12:41 PM EDT To: Doreene Nest, NP Subject: pre op clearance for a hysteroscopy, d&c       I told her I recommend doing an outpatient hysteroscopy, d&c. Does she need a formal pre op clearance visit from you, or can you clear her from her most recent visit with you? Thanks so much  -[employer]

## 2019-08-07 NOTE — Progress Notes (Signed)
Severe Snoring was  noted. EKG was in keeping with normal sinus rhythm (NSR). The  arousals were noted as: 95 were spontaneous, 0 were associated  with PLMs, 78 were associated with respiratory events.   IMPRESSION:  1. Severe Obstructive Sleep Apnea (OSA), Severe Snoring and no  oxygen desaturation of duration.  2. Loud Snoring  3. normal EKG   RECOMMENDATIONS:   1. Advise full-night, attended, CPAP titration study to optimize  therapy. In spite of the very high BMI, there is no evidence of  obesity hypoventilation.

## 2019-08-07 NOTE — Progress Notes (Signed)
Daily Session Note  Patient Details  Name: Brandy [name] MRN: [id number] Date of Birth: 09/20/1965 Referring Provider:     Pulmonary Rehab from 04/06/2019 in [facility]  Referring Provider  Gollan      Encounter Date: 08/07/2019  Check In: Session Check In - 08/07/19 0941      Check-In   Supervising physician immediately available to respond to emergencies  See telemetry face sheet for immediately available ER MD    Location  [facility]    Staff Present  Renita Papa, RN BSN;Joseph Hood RCP,RRT,BSRT;Amanda Oletta Darter, [place], ACSM CEP, Exercise Physiologist    Virtual Visit  No    Medication changes reported      No    Fall or balance concerns reported     No    Warm-up and Cool-down  Performed on first and last piece of equipment    Resistance Training Performed  Yes    VAD Patient?  No    PAD/SET Patient?  No      Pain Assessment   Currently in Pain?  No/denies          Social History   Tobacco Use  Smoking Status Never Smoker  Smokeless Tobacco Never Used    Goals Met:  Independence with exercise equipment Exercise tolerated well No report of cardiac concerns or symptoms Strength training completed today  Goals Unmet:  Not Applicable  Comments: Pt able to follow exercise prescription today without complaint.  Will continue to monitor for progression.    Dr. Emily Filbert is Medical Director for [facility] and [facility].

## 2019-08-10 ENCOUNTER — Telehealth: Payer: Self-pay

## 2019-08-10 NOTE — Telephone Encounter (Signed)
-----   Message from Melvyn Novas, MD sent at 08/07/2019 10:04 AM EDT ----- Severe Snoring was  noted. EKG was in keeping with normal sinus rhythm (NSR). The  arousals were noted as: 95 were spontaneous, 0 were associated  with PLMs, 78 were associated with respiratory events.   IMPRESSION:  1. Severe Obstructive Sleep Apnea (OSA), Severe Snoring and no  oxygen desaturation of duration.  2. Loud Snoring  3. normal EKG   RECOMMENDATIONS:   1. Advise full-night, attended, CPAP titration study to optimize  therapy. In spite of the very high BMI, there is no evidence of  obesity hypoventilation.

## 2019-08-10 NOTE — Telephone Encounter (Signed)
I called pt. I advised pt that Dr. Dohmeier reviewed their sleep study results and found that pt has severe osa and recommends that pt be treated with a cpap. Dr. Dohmeier recommends that pt return for a repeat sleep study in order to properly titrate the cpap and ensure a good mask fit. Pt is agreeable to returning for a titration study. I advised pt that our sleep lab will file with pt's insurance and call pt to schedule the sleep study when we hear back from the pt's insurance regarding coverage of this sleep study. Pt verbalized understanding of results. Pt had no questions at this time but was encouraged to call back if questions arise.   

## 2019-08-12 ENCOUNTER — Encounter: Payer: BC Managed Care – PPO | Admitting: *Deleted

## 2019-08-12 ENCOUNTER — Other Ambulatory Visit: Payer: Self-pay

## 2019-08-12 DIAGNOSIS — I5032 Chronic diastolic (congestive) heart failure: Secondary | ICD-10-CM

## 2019-08-12 DIAGNOSIS — Z6841 Body Mass Index (BMI) 40.0 and over, adult: Secondary | ICD-10-CM | POA: Diagnosis not present

## 2019-08-12 DIAGNOSIS — M62838 Other muscle spasm: Secondary | ICD-10-CM | POA: Diagnosis not present

## 2019-08-12 DIAGNOSIS — I509 Heart failure, unspecified: Secondary | ICD-10-CM | POA: Diagnosis not present

## 2019-08-12 DIAGNOSIS — E1159 Type 2 diabetes mellitus with other circulatory complications: Secondary | ICD-10-CM | POA: Diagnosis not present

## 2019-08-12 DIAGNOSIS — R0609 Other forms of dyspnea: Secondary | ICD-10-CM

## 2019-08-12 DIAGNOSIS — M6281 Muscle weakness (generalized): Secondary | ICD-10-CM | POA: Diagnosis not present

## 2019-08-12 DIAGNOSIS — R0600 Dyspnea, unspecified: Secondary | ICD-10-CM

## 2019-08-12 DIAGNOSIS — M5417 Radiculopathy, lumbosacral region: Secondary | ICD-10-CM | POA: Diagnosis not present

## 2019-08-12 DIAGNOSIS — M545 Low back pain: Secondary | ICD-10-CM | POA: Diagnosis not present

## 2019-08-12 DIAGNOSIS — Z713 Dietary counseling and surveillance: Secondary | ICD-10-CM | POA: Diagnosis not present

## 2019-08-12 DIAGNOSIS — I872 Venous insufficiency (chronic) (peripheral): Secondary | ICD-10-CM | POA: Diagnosis not present

## 2019-08-12 NOTE — Progress Notes (Signed)
Daily Session Note  Patient Details  Name: Brandy [name] MRN: [id number] Date of Birth: 09/21/1965 Referring Provider:     Pulmonary Rehab from 04/06/2019 in [facility]  Referring Provider  Gollan      Encounter Date: 08/12/2019  Check In: Session Check In - 08/12/19 1042      Check-In   Supervising physician immediately available to respond to emergencies  See telemetry face sheet for immediately available ER MD    Location  [facility]    Staff Present  Meredith Craven, RN BSN;Joseph Hood RCP,RRT,BSRT;Amanda Sommer, BA, ACSM CEP, Exercise Physiologist    Virtual Visit  No    Medication changes reported      No    Fall or balance concerns reported     No    Warm-up and Cool-down  Performed on first and last piece of equipment    Resistance Training Performed  Yes    VAD Patient?  No    PAD/SET Patient?  No      Pain Assessment   Currently in Pain?  No/denies          Social History   Tobacco Use  Smoking Status Never Smoker  Smokeless Tobacco Never Used    Goals Met:  Independence with exercise equipment Exercise tolerated well No report of cardiac concerns or symptoms Strength training completed today  Goals Unmet:  Not Applicable  Comments: Pt able to follow exercise prescription today without complaint.  Will continue to monitor for progression.    Dr. Mark Miller is Medical Director for [facility] and [facility]. 

## 2019-08-13 ENCOUNTER — Ambulatory Visit (INDEPENDENT_AMBULATORY_CARE_PROVIDER_SITE_OTHER): Payer: BC Managed Care – PPO | Admitting: Psychology

## 2019-08-13 ENCOUNTER — Other Ambulatory Visit: Payer: Self-pay | Admitting: Primary Care

## 2019-08-13 DIAGNOSIS — F3289 Other specified depressive episodes: Secondary | ICD-10-CM

## 2019-08-13 DIAGNOSIS — E119 Type 2 diabetes mellitus without complications: Secondary | ICD-10-CM

## 2019-08-13 DIAGNOSIS — M79604 Pain in right leg: Secondary | ICD-10-CM | POA: Diagnosis not present

## 2019-08-13 DIAGNOSIS — M2550 Pain in unspecified joint: Secondary | ICD-10-CM

## 2019-08-13 DIAGNOSIS — G8929 Other chronic pain: Secondary | ICD-10-CM

## 2019-08-13 DIAGNOSIS — M25512 Pain in left shoulder: Secondary | ICD-10-CM | POA: Diagnosis not present

## 2019-08-13 DIAGNOSIS — M79605 Pain in left leg: Secondary | ICD-10-CM | POA: Diagnosis not present

## 2019-08-13 DIAGNOSIS — M545 Low back pain: Secondary | ICD-10-CM | POA: Diagnosis not present

## 2019-08-14 ENCOUNTER — Other Ambulatory Visit: Payer: Self-pay

## 2019-08-14 DIAGNOSIS — I5032 Chronic diastolic (congestive) heart failure: Secondary | ICD-10-CM

## 2019-08-14 DIAGNOSIS — Z713 Dietary counseling and surveillance: Secondary | ICD-10-CM | POA: Diagnosis not present

## 2019-08-14 DIAGNOSIS — E1159 Type 2 diabetes mellitus with other circulatory complications: Secondary | ICD-10-CM | POA: Diagnosis not present

## 2019-08-14 DIAGNOSIS — Z6841 Body Mass Index (BMI) 40.0 and over, adult: Secondary | ICD-10-CM | POA: Diagnosis not present

## 2019-08-14 DIAGNOSIS — I872 Venous insufficiency (chronic) (peripheral): Secondary | ICD-10-CM | POA: Diagnosis not present

## 2019-08-14 DIAGNOSIS — R0600 Dyspnea, unspecified: Secondary | ICD-10-CM | POA: Diagnosis not present

## 2019-08-14 DIAGNOSIS — R0609 Other forms of dyspnea: Secondary | ICD-10-CM

## 2019-08-14 DIAGNOSIS — I509 Heart failure, unspecified: Secondary | ICD-10-CM | POA: Diagnosis not present

## 2019-08-14 MED ORDER — GABAPENTIN 300 MG PO CAPS: 300.0000 mg | ORAL_CAPSULE | Freq: Three times a day (TID) | ORAL | 3 refills | Status: DC

## 2019-08-14 NOTE — Progress Notes (Signed)
Daily Session Note  Patient Details  Name: Brandy [name] MRN: [id number] Date of Birth: 29-Nov-1965 Referring Provider:     Pulmonary Rehab from 04/06/2019 in [facility] Re[facility]  Referring Provider  Gollan      Encounter Date: 08/14/2019  Check In: Session Check In - 08/14/19 [place]      Check-In   Supervising physician immediately available to respond to emergencies  See telemetry face sheet for immediately available ER MD    Location  [facility]    Staff Present  Vida Rigger RN, Vickki Hearing, BA, ACSM CEP, Exercise Physiologist;Joseph Tessie Fass RCP,RRT,BSRT    Virtual Visit  No    Medication changes reported      No    Fall or balance concerns reported     No    Warm-up and Cool-down  Performed on first and last piece of equipment    Resistance Training Performed  Yes    VAD Patient?  No    PAD/SET Patient?  No      Pain Assessment   Currently in Pain?  No/denies          Social History   Tobacco Use  Smoking Status Never Smoker  Smokeless Tobacco Never Used    Goals Met:  Independence with exercise equipment Exercise tolerated well No report of cardiac concerns or symptoms Strength training completed today  Goals Unmet:  Not Applicable  Comments: Pt able to follow exercise prescription today without complaint.  Will continue to monitor for progression.   Dr. Emily Filbert is Medical Director for [facility] and [facility].

## 2019-08-14 NOTE — Telephone Encounter (Signed)
Brandy [name], see her message regarding sending CT and MRI report to Dr. Ethelene Hal.

## 2019-08-17 ENCOUNTER — Other Ambulatory Visit: Payer: Self-pay

## 2019-08-17 ENCOUNTER — Encounter: Payer: BC Managed Care – PPO | Admitting: *Deleted

## 2019-08-17 DIAGNOSIS — I872 Venous insufficiency (chronic) (peripheral): Secondary | ICD-10-CM | POA: Diagnosis not present

## 2019-08-17 DIAGNOSIS — E1159 Type 2 diabetes mellitus with other circulatory complications: Secondary | ICD-10-CM | POA: Diagnosis not present

## 2019-08-17 DIAGNOSIS — Z6841 Body Mass Index (BMI) 40.0 and over, adult: Secondary | ICD-10-CM | POA: Diagnosis not present

## 2019-08-17 DIAGNOSIS — R0600 Dyspnea, unspecified: Secondary | ICD-10-CM

## 2019-08-17 DIAGNOSIS — R0609 Other forms of dyspnea: Secondary | ICD-10-CM

## 2019-08-17 DIAGNOSIS — I509 Heart failure, unspecified: Secondary | ICD-10-CM | POA: Diagnosis not present

## 2019-08-17 DIAGNOSIS — Z713 Dietary counseling and surveillance: Secondary | ICD-10-CM | POA: Diagnosis not present

## 2019-08-17 DIAGNOSIS — I5032 Chronic diastolic (congestive) heart failure: Secondary | ICD-10-CM

## 2019-08-17 NOTE — Progress Notes (Signed)
Daily Session Note  Patient Details  Name: Brandy [name] MRN: [id number] Date of Birth: 09/08/1965 Referring Provider:     Pulmonary Rehab from 04/06/2019 in [facility] and Pulmonary Rehab  Referring Provider  Gollan      Encounter Date: 08/17/2019  Check In: Session Check In - 08/17/19 [place]      Check-In   Supervising physician immediately available to respond to emergencies  See telemetry face sheet for immediately available ER MD    Location  [facility]    Staff Present  Heath Lark, RN, BSN, Laveda Norman, BS, ACSM CEP, Exercise Physiologist;Joseph Tessie Fass RCP,RRT,BSRT    Virtual Visit  No    Medication changes reported      No    Fall or balance concerns reported     No    Warm-up and Cool-down  Performed on first and last piece of equipment    Resistance Training Performed  Yes    VAD Patient?  No    PAD/SET Patient?  No      Pain Assessment   Currently in Pain?  No/denies          Social History   Tobacco Use  Smoking Status Never Smoker  Smokeless Tobacco Never Used    Goals Met:  Proper associated with RPD/PD & O2 Sat Independence with exercise equipment Exercise tolerated well No report of cardiac concerns or symptoms  Goals Unmet:  Not Applicable  Comments: Pt able to follow exercise prescription today without complaint.  Will continue to monitor for progression.    Dr. Emily Filbert is Medical Director for [facility] and [facility].

## 2019-08-19 ENCOUNTER — Encounter: Payer: Self-pay | Admitting: *Deleted

## 2019-08-19 DIAGNOSIS — R0600 Dyspnea, unspecified: Secondary | ICD-10-CM

## 2019-08-19 DIAGNOSIS — M62838 Other muscle spasm: Secondary | ICD-10-CM | POA: Diagnosis not present

## 2019-08-19 DIAGNOSIS — R0609 Other forms of dyspnea: Secondary | ICD-10-CM

## 2019-08-19 DIAGNOSIS — I5032 Chronic diastolic (congestive) heart failure: Secondary | ICD-10-CM

## 2019-08-19 DIAGNOSIS — M5417 Radiculopathy, lumbosacral region: Secondary | ICD-10-CM | POA: Diagnosis not present

## 2019-08-19 DIAGNOSIS — M545 Low back pain: Secondary | ICD-10-CM | POA: Diagnosis not present

## 2019-08-19 DIAGNOSIS — M6281 Muscle weakness (generalized): Secondary | ICD-10-CM | POA: Diagnosis not present

## 2019-08-19 NOTE — Progress Notes (Signed)
Pulmonary Individual Treatment Plan  Patient Details  Name: MATISHA TERMINE MRN: [id number] Date of Birth: 1965-12-05 Referring Provider:     Pulmonary Rehab from 04/06/2019 in [facility] Cardiac and Pulmonary Rehab  Referring Provider  Gollan      Initial Encounter Date:    Pulmonary Rehab from 04/06/2019 in [facility] Cardiac and Pulmonary Rehab  Date  04/06/19      Visit Diagnosis: Heart failure, diastolic, chronic ([place])  Dyspnea on exertion  Patient's Home Medications on Admission:  Current Outpatient Medications:  .  ARIPiprazole (ABILIFY) 5 MG tablet, Take 5 mg by mouth daily., Disp: , Rfl:  .  aspirin EC 81 MG tablet, Take 81 mg by mouth daily., Disp: , Rfl:  .  atorvastatin (LIPITOR) 10 MG tablet, TAKE 1 TABLET (10 MG TOTAL) BY MOUTH DAILY. FOR CHOLESTEROL., Disp: 90 tablet, Rfl: 0 .  celecoxib (CELEBREX) 100 MG capsule, Take 1 capsule (100 mg total) by mouth 2 (two) times daily. As needed for pain., Disp: 180 capsule, Rfl: 1 .  cetirizine (ZYRTEC) 10 MG tablet, TAKE 1 TABLET BY MOUTH EVERY DAY, Disp: 90 tablet, Rfl: 0 .  cholecalciferol (VITAMIN D3) 25 MCG (1000 UT) tablet, Take 1,000 Units by mouth daily., Disp: , Rfl:  .  citalopram (CELEXA) 20 MG tablet, Take 20 mg by mouth daily., Disp: , Rfl:  .  Continuous Blood Gluc Receiver (DEXCOM G6 RECEIVER) DEVI, Inject 1 Device into the skin daily., Disp: 4 each, Rfl: 1 .  Continuous Blood Gluc Sensor (DEXCOM G6 SENSOR) MISC, Inject 1 Device into the skin daily., Disp: 3 each, Rfl: 1 .  Continuous Blood Gluc Transmit (DEXCOM G6 TRANSMITTER) MISC, Inject 1 Device into the skin daily., Disp: 4 each, Rfl: 1 .  Cyanocobalamin (VITAMIN B-12 PO), Take 1 tablet by mouth daily., Disp: , Rfl:  .  DULoxetine (CYMBALTA) 30 MG capsule, Take 30 mg by mouth daily. , Disp: , Rfl:  .  DULoxetine (CYMBALTA) 60 MG capsule, Take 60 mg by mouth daily., Disp: , Rfl:  .  folic acid (FOLVITE) 1 MG tablet, Take 1 mg by mouth daily., Disp: , Rfl:  .  gabapentin  (NEURONTIN) 300 MG capsule, Take 1 capsule (300 mg total) by mouth 3 (three) times daily. For back pain., Disp: 270 capsule, Rfl: 3 .  glipiZIDE (GLUCOTROL) 10 MG tablet, TAKE 1 TABLET (10 MG TOTAL) BY MOUTH 2 (TWO) TIMES DAILY BEFORE A MEAL. FOR DIABETES., Disp: 180 tablet, Rfl: 3 .  hydrocortisone 2.5 % cream, Apply topically 2 (two) times daily., Disp: , Rfl:  .  Insulin Glargine (BASAGLAR KWIKPEN) 100 UNIT/ML SOPN, Inject 0.35 mLs (35 Units total) into the skin at bedtime., Disp: 15 mL, Rfl: 2 .  Insulin Pen Needle (PEN NEEDLES) 31G X 6 MM MISC, Use nightly with insulin., Disp: 100 each, Rfl: 2 .  lisinopril-hydrochlorothiazide (ZESTORETIC) 20-12.5 MG tablet, TAKE 1 TABLET BY MOUTH DAILY FOR BLOOD PRESSURE, Disp: 90 tablet, Rfl: 1 .  metFORMIN (GLUCOPHAGE-XR) 500 MG 24 hr tablet, TAKE 2 TABLETS (1,000 MG TOTAL) BY MOUTH DAILY WITH BREAKFAST. FOR DIABETES., Disp: 180 tablet, Rfl: 1 .  metroNIDAZOLE (METROCREAM) 0.75 % cream, Apply 1 application topically daily., Disp: , Rfl:  .  miconazole (MONISTAT 7) 2 % vaginal cream, Place 1 Applicatorful vaginally at bedtime. Apply for seven nights, Disp: 30 g, Rfl: 2 .  Multiple Minerals-Vitamins (CALCIUM-MAGNESIUM-ZINC-D3 PO), Take 1 tablet by mouth daily., Disp: , Rfl:  .  sulfamethoxazole-trimethoprim (BACTRIM DS) 800-160 MG tablet, Take 1  tablet by mouth 2 (two) times daily. For urinary tract infection. (Patient not taking: Reported on 07/14/2019), Disp: 10 tablet, Rfl: 0 .  traZODone (DESYREL) 50 MG tablet, Take 50 mg by mouth at bedtime as needed. Taking 1/2 tab daily, Disp: , Rfl:  .  Vitamin D, Ergocalciferol, (DRISDOL) 1.25 MG (50000 UT) CAPS capsule, TAKE 1 CAPSULE (50,000 UNITS TOTAL) BY MOUTH EVERY 7 (SEVEN) DAYS., Disp: 12 capsule, Rfl: 1  Past Medical History: Past Medical History:  Diagnosis Date  . Anxiety   . Depression   . Diabetes mellitus without complication ([place])   . Dysmetabolic syndrome X   . Hypertension   . Hypertriglyceridemia    . Obesity   . Tetanus vaccine causing adverse effect in therapeutic use     Tobacco Use: Social History   Tobacco Use  Smoking Status Never Smoker  Smokeless Tobacco Never Used    Labs: Recent Review Flowsheet Data    Labs for ITP Cardiac and Pulmonary Rehab Latest Ref Rng & Units 07/15/2018 08/29/2018 12/10/2018 03/18/2019 06/17/2019   Cholestrol 0 - 200 mg/dL - - - 160 -   LDLCALC 0 - 99 mg/dL - - - 71 -   LDLDIRECT mg/dL - - - - -   HDL >39.00 mg/dL - - - 56.70 -   Trlycerides 0.0 - 149.0 mg/dL - - - 159.0(H) -   Hemoglobin A1c 4.0 - 5.6 % - 8.5(H) 8.9(A) 9.1(A) 9.2(A)   PHART 7.350 - 7.450 - - - - -   PCO2ART 32.0 - 48.0 mmHg - - - - -   HCO3 20.0 - 28.0 mmol/L - - - - -   ACIDBASEDEF 0.0 - 2.0 mmol/L - - - - -   O2SAT % 97.9 - - - -       Pulmonary Assessment Scores: Pulmonary Assessment Scores    Row Name 04/06/19 1704         ADL UCSD   SOB Score total  83     Rest  0     Walk  4     Stairs  5     Bath  4     Dress  1     Shop  5       CAT Score   CAT Score  18       mMRC Score   mMRC Score  3        UCSD: Self-administered rating of dyspnea associated with activities of daily living (ADLs) 6-point scale (0 = "not at all" to 5 = "maximal or unable to do because of breathlessness")  Scoring Scores range from 0 to 120.  Minimally important difference is 5 units  CAT: CAT can identify the health impairment of COPD patients and is better correlated with disease progression.  CAT has a scoring range of zero to 40. The CAT score is classified into four groups of low (less than 10), medium (10 - 20), high (21-30) and very high (31-40) based on the impact level of disease on health status. A CAT score over 10 suggests significant symptoms.  A worsening CAT score could be explained by an exacerbation, poor medication adherence, poor inhaler technique, or progression of COPD or comorbid conditions.  CAT MCID is 2 points  mMRC: mMRC (Modified Medical Research  Council) Dyspnea Scale is used to assess the degree of baseline functional disability in patients of respiratory disease due to dyspnea. No minimal important difference is established. A decrease in score of  1 point or greater is considered a positive change.   Pulmonary Function Assessment:   Exercise Target Goals: Exercise Program Goal: Individual exercise prescription set using results from initial 6 min walk test and THRR while considering  patient's activity barriers and safety.   Exercise Prescription Goal: Initial exercise prescription builds to 30-45 minutes a day of aerobic activity, 2-3 days per week.  Home exercise guidelines will be given to patient during program as part of exercise prescription that the participant will acknowledge.  Education: Aerobic Exercise & Resistance Training: - Gives group verbal and written instruction on the various components of exercise. Focuses on aerobic and resistive training programs and the benefits of this training and how to safely progress through these programs..   Education: Exercise & Equipment Safety: - Individual verbal instruction and demonstration of equipment use and safety with use of the equipment.   Pulmonary Rehab from 04/06/2019 in [facility] Cardiac and Pulmonary Rehab  Date  04/06/19  Educator  AS  Instruction Review Code  1- Verbalizes Understanding      Education: Exercise Physiology & General Exercise Guidelines: - Group verbal and written instruction with models to review the exercise physiology of the cardiovascular system and associated critical values. Provides general exercise guidelines with specific guidelines to those with heart or lung disease.    Education: Flexibility, Balance, Mind/Body Relaxation: Provides group verbal/written instruction on the benefits of flexibility and balance training, including mind/body exercise modes such as yoga, pilates and tai chi.  Demonstration and skill practice  provided.   Activity Barriers & Risk Stratification: Activity Barriers & Cardiac Risk Stratification - 04/01/19 1413      Activity Barriers & Cardiac Risk Stratification   Activity Barriers  Arthritis;Deconditioning;Muscular Weakness;Shortness of Breath;Fibromyalgia    Comments  chronic fatigue, neuropathy.    Cardiac Risk Stratification  High       6 Minute Walk: 6 Minute Walk    Row Name 04/06/19 1659 06/29/19 1140       6 Minute Walk   Phase  Initial  --    Distance  475 feet  500 feet    Walk Time  4 minutes  4.5 minutes    # of Rest Breaks  2  2    MPH  1.34  1.26    METS  1  1    RPE  14  17    Perceived Dyspnea   3  3    VO2 Peak  3.4  3.3    Symptoms  Yes (comment)  Yes (comment)    Comments  back and leg pain  shortness of breath/ back and leg pain    Resting HR  99 bpm  92 bpm    Resting BP  154/98  112/70    Resting Oxygen Saturation   96 %  96 %    Exercise Oxygen Saturation  during 6 min walk  95 %  95 %    Max Ex. HR  145 bpm  135 bpm    Max Ex. BP  168/102  146/88      Oxygen Initial Assessment: Oxygen Initial Assessment - 04/01/19 1412      Home Oxygen   Home Oxygen Device  None    Sleep Oxygen Prescription  None    Home Exercise Oxygen Prescription  None    Home at Rest Exercise Oxygen Prescription  None      Initial 6 min Walk   Oxygen Used  None  Program Oxygen Prescription   Program Oxygen Prescription  None      Intervention   Short Term Goals  To learn and understand importance of monitoring SPO2 with pulse oximeter and demonstrate accurate use of the pulse oximeter.;To learn and understand importance of maintaining oxygen saturations>88%;To learn and demonstrate proper pursed lip breathing techniques or other breathing techniques.    Long  Term Goals  Verbalizes importance of monitoring SPO2 with pulse oximeter and return demonstration;Maintenance of O2 saturations>88%;Exhibits proper breathing techniques, such as pursed lip breathing  or other method taught during program session       Oxygen Re-Evaluation: Oxygen Re-Evaluation    Row Name 06/29/19 0951 08/12/19 1153           Program Oxygen Prescription   Program Oxygen Prescription  None  None        Home Oxygen   Home Oxygen Device  None  None      Sleep Oxygen Prescription  None  None      Home Exercise Oxygen Prescription  None  None      Home at Rest Exercise Oxygen Prescription  --  None      Compliance with Home Oxygen Use  Yes  --        Goals/Expected Outcomes   Short Term Goals  To learn and demonstrate proper pursed lip breathing techniques or other breathing techniques.  To learn and demonstrate proper pursed lip breathing techniques or other breathing techniques.;To learn and understand importance of maintaining oxygen saturations>88%;To learn and understand importance of monitoring SPO2 with pulse oximeter and demonstrate accurate use of the pulse oximeter.      Long  Term Goals  Exhibits proper breathing techniques, such as pursed lip breathing or other method taught during program session  Exhibits proper breathing techniques, such as pursed lip breathing or other method taught during program session;Maintenance of O2 saturations>88%;Verbalizes importance of monitoring SPO2 with pulse oximeter and return demonstration      Comments  Reviewed PLB technique with pt.  Talked about how it works and it's importance in maintaining their exercise saturations.  Toye is getting better with her PLB and her breathing overall is getting better.  Less at rest now!!  She has to remind herself to use the PLB.  Sent her videos to review about why it is important and how it works.  Her neurologist has also told her to work on it to help more with off gassing her CO2 stores.      Goals/Expected Outcomes  Short: Become more profiecient at using PLB.   Long: Become independent at using PLB.  Short: More PLB  Long: Improve SOB overall         Oxygen Discharge (Final  Oxygen Re-Evaluation): Oxygen Re-Evaluation - 08/12/19 1153      Program Oxygen Prescription   Program Oxygen Prescription  None      Home Oxygen   Home Oxygen Device  None    Sleep Oxygen Prescription  None    Home Exercise Oxygen Prescription  None    Home at Rest Exercise Oxygen Prescription  None      Goals/Expected Outcomes   Short Term Goals  To learn and demonstrate proper pursed lip breathing techniques or other breathing techniques.;To learn and understand importance of maintaining oxygen saturations>88%;To learn and understand importance of monitoring SPO2 with pulse oximeter and demonstrate accurate use of the pulse oximeter.    Long  Term Goals  Exhibits proper breathing techniques, such  as pursed lip breathing or other method taught during program session;Maintenance of O2 saturations>88%;Verbalizes importance of monitoring SPO2 with pulse oximeter and return demonstration    Comments  Clyda is getting better with her PLB and her breathing overall is getting better.  Less at rest now!!  She has to remind herself to use the PLB.  Sent her videos to review about why it is important and how it works.  Her neurologist has also told her to work on it to help more with off gassing her CO2 stores.    Goals/Expected Outcomes  Short: More PLB  Long: Improve SOB overall       Initial Exercise Prescription: Initial Exercise Prescription - 04/06/19 1700      Date of Initial Exercise RX and Referring Provider   Date  04/06/19    Referring Provider  Gollan      Treadmill   MPH  1    Grade  0    Minutes  15   rest as needed   METs  1      NuStep   Level  1    SPM  80    Minutes  15    METs  1      Arm Ergometer   Level  1    RPM  25    Minutes  15    METs  1      Biostep-RELP   Level  1    SPM  50    Minutes  15      Prescription Details   Frequency (times per week)  3    Duration  Progress to 30 minutes of continuous aerobic without signs/symptoms of physical  distress      Intensity   THRR 40-80% of Max Heartrate  121-150    Ratings of Perceived Exertion  11-15    Perceived Dyspnea  0-4      Resistance Training   Training Prescription  Yes    Weight  3 lb    Reps  10-15       Perform Capillary Blood Glucose checks as needed.  Exercise Prescription Changes: Exercise Prescription Changes    Row Name 04/06/19 1700 05/06/19 1100 07/02/19 1100 07/15/19 1400 07/29/19 1500     Response to Exercise   Blood Pressure (Admit)  154/98  --  118/70  142/74  160/100   Blood Pressure (Exercise)  168/102  --  --  142/84  134/70   Blood Pressure (Exit)  132/84  --  124/68  140/82  124/84   Heart Rate (Admit)  99 bpm  --  93 bpm  83 bpm  88 bpm   Heart Rate (Exercise)  145 bpm  --  120 bpm  106 bpm  105 bpm   Heart Rate (Exit)  104 bpm  --  98 bpm  99 bpm  97 bpm   Oxygen Saturation (Admit)  96 %  --  96 %  98 %  97 %   Oxygen Saturation (Exercise)  95 %  --  96 %  98 %  97 %   Oxygen Saturation (Exit)  96 %  --  96 %  98 %  98 %   Rating of Perceived Exertion (Exercise)  14  --  _0 Perceived Dyspnea (Exercise)  3  --  _1 Symptoms  leg and back pain  --  --  leg and  back pain  leg and back pain   Comments  --  Reviewed Virtual Program  --  --  --   Duration  --  Progress to 10 minutes continuous walking  at current work load and total walking time to 30-45 min  Progress to 30 minutes of  aerobic without signs/symptoms of physical distress  Progress to 30 minutes of  aerobic without signs/symptoms of physical distress  Continue with 30 min of aerobic exercise without signs/symptoms of physical distress.   Intensity  --  THRR unchanged  THRR unchanged  THRR unchanged  THRR unchanged     Progression   Progression  --  Continue to progress workloads to maintain intensity without signs/symptoms of physical distress.  Continue to progress workloads to maintain intensity without signs/symptoms of physical distress.  Continue to progress  workloads to maintain intensity without signs/symptoms of physical distress.  Continue to progress workloads to maintain intensity without signs/symptoms of physical distress.   Average METs  --  --  --  1.73  1.2     Resistance Training   Training Prescription  --  Yes  Yes  Yes  Yes   Weight  --  3 lb, ROM, body weight, bands  3 lb  3 lb  3 lb   Reps  --  10-15  10-15  10-15  10-15     Interval Training   Interval Training  --  No  No  No  No     Treadmill   MPH  --  --  0.6  0.7  0.6   Grade  --  --  0  0  0   Minutes  --  --  _0 METs  --  --  1  1.5  1.4     NuStep   Level  --  --  --  1  3   Minutes  --  --  --  15  15   METs  --  --  --  2  1.3     Recumbant Elliptical   Level  --  --  --  1  1   Minutes  --  --  --  15  15   METs  --  --  --  1.4  1.1     Biostep-RELP   Level  --  --  --  1  1   Minutes  --  --  --  15  15   METs  --  --  --  2  1     Track   Laps  --  --  --  --  --   Minutes  --  10 at home along with videos  --  --  --     Home Exercise Plan   Plans to continue exercise at  --  Home (comment) walking, videos  --  Home (comment) walking, videos  Home (comment) walking, videos   Frequency  --  Add 3 additional days to program exercise sessions.  --  Add 3 additional days to program exercise sessions.  Add 3 additional days to program exercise sessions.   Initial Home Exercises Provided  --  05/06/19  --  05/06/19  05/06/19   Row Name 08/14/19 0900             Response to Exercise   Blood Pressure (Admit)  154/92       Blood Pressure (Exercise)  164/82       Blood Pressure (Exit)  148/78       Heart Rate (Admit)  102 bpm       Heart Rate (Exercise)  110 bpm       Heart Rate (Exit)  83 bpm       Oxygen Saturation (Admit)  100 %       Oxygen Saturation (Exercise)  95 %       Oxygen Saturation (Exit)  95 %       Rating of Perceived Exertion (Exercise)  14       Perceived Dyspnea (Exercise)  1       Symptoms  leg and back pain        Duration  Continue with 30 min of aerobic exercise without signs/symptoms of physical distress.       Intensity  THRR unchanged         Progression   Progression  Continue to progress workloads to maintain intensity without signs/symptoms of physical distress.       Average METs  1.55         Resistance Training   Training Prescription  Yes       Weight  3 lb       Reps  10-15         Interval Training   Interval Training  No         Treadmill   MPH  0.6       Minutes  2       METs  1.4         T5 Nustep   Level  3       Minutes  15       METs  1.7         Home Exercise Plan   Plans to continue exercise at  Home (comment) walking, videos       Frequency  Add 3 additional days to program exercise sessions.       Initial Home Exercises Provided  05/06/19          Exercise Comments: Exercise Comments    Row Name 06/29/19 [phone]           Exercise Comments  First full day of exercise!  Patient was oriented to gym and equipment including functions, settings, policies, and procedures.  Patient's individual exercise prescription and treatment plan were reviewed.  All starting workloads were established based on the results of the 6 minute walk test done at initial orientation visit.  The plan for exercise progression was also introduced and progression will be customized based on patient's performance and goals.          Exercise Goals and Review: Exercise Goals    Row Name 04/06/19 1715             Exercise Goals   Increase Physical Activity  Yes       Intervention  Provide advice, education, support and counseling about physical activity/exercise needs.;Develop an individualized exercise prescription for aerobic and resistive training based on initial evaluation findings, risk stratification, comorbidities and participant's personal goals.       Expected Outcomes  Short Term: Attend rehab on a regular basis to increase amount of physical activity.;Long Term: Add in  home exercise to make exercise part of routine and to increase amount of physical activity.;Long Term: Exercising regularly at least 3-5 days a week.       Increase Strength and Stamina  Yes       Intervention  Provide advice, education, support and counseling about physical activity/exercise needs.;Develop an individualized exercise prescription for aerobic and resistive training based on initial evaluation findings, risk stratification, comorbidities and participant's personal goals.       Expected Outcomes  Short Term: Increase workloads from initial exercise prescription for resistance, speed, and METs.;Short Term: Perform resistance training exercises routinely during rehab and add in resistance training at home;Long Term: Improve cardiorespiratory fitness, muscular endurance and strength as measured by increased METs and functional capacity (6MWT)       Able to understand and use rate of perceived exertion (RPE) scale  Yes       Expected Outcomes  Short Term: Able to use RPE daily in rehab to express subjective intensity level;Long Term:  Able to use RPE to guide intensity level when exercising independently       Able to understand and use Dyspnea scale  Yes       Intervention  Provide education and explanation on how to use Dyspnea scale       Expected Outcomes  Short Term: Able to use Dyspnea scale daily in rehab to express subjective sense of shortness of breath during exertion;Long Term: Able to use Dyspnea scale to guide intensity level when exercising independently       Knowledge and understanding of Target Heart Rate Range (THRR)  Yes       Intervention  Provide education and explanation of THRR including how the numbers were predicted and where they are located for reference       Expected Outcomes  Short Term: Able to state/look up THRR;Short Term: Able to use daily as guideline for intensity in rehab;Long Term: Able to use THRR to govern intensity when exercising independently        Able to check pulse independently  Yes       Intervention  Provide education and demonstration on how to check pulse in carotid and radial arteries.;Review the importance of being able to check your own pulse for safety during independent exercise       Expected Outcomes  Short Term: Able to explain why pulse checking is important during independent exercise;Long Term: Able to check pulse independently and accurately       Understanding of Exercise Prescription  Yes       Intervention  Provide education, explanation, and written materials on patient's individual exercise prescription       Expected Outcomes  Short Term: Able to explain program exercise prescription;Long Term: Able to explain home exercise prescription to exercise independently          Exercise Goals Re-Evaluation : Exercise Goals Re-Evaluation    Row Name 04/22/19 1247 05/06/19 1151 05/27/19 1205 06/24/19 1146 06/29/19 0948     Exercise Goal Re-Evaluation   Exercise Goals Review  --  Increase Physical Activity;Increase Strength and Stamina;Able to understand and use rate of perceived exertion (RPE) scale;Able to understand and use Dyspnea scale;Knowledge and understanding of Target Heart Rate Range (THRR);Able to check pulse independently;Understanding of Exercise Prescription  Increase Physical Activity;Increase Strength and Stamina;Understanding of Exercise Prescription  Increase Physical Activity;Increase Strength and Stamina;Understanding of Exercise Prescription  Able to understand and use rate of perceived exertion (RPE) scale;Able to understand and use Dyspnea scale;Knowledge and understanding of Target Heart Rate Range (THRR);Understanding of Exercise Prescription   Comments  Not starting until January  Reviewed home exercise with pt today.  Pt plans to walk and use staff  videos at home for exercise.  Reviewed THR, pulse, RPE, sign and symptoms, and when to call 911 or MD.  Also discussed weather considerations and indoor  options.  Pt voiced understanding.  Ritha is doing okay at home.  She had a lot of questions about her exercise. She was still not sure how to start going.  We talked about downloading [employer] to help prompt her to exercise her three days a week and to record her vitals.  Resent SMS for app. She is going to start with walking and/or staff videos.  We reviewed the order for videos (warm up, weights, cardio, stretch/cool down) and walking.  She feels more confident with how to get started now.  She is doing well with her exercise and has a renewed sense of motivation to get in shape and get moving again. She has a new commitment to exercise and her overall wellbeing.  Reviewed RPE scale, THR and program prescription with pt today.  Pt voiced understanding and was given a copy of goals to take home.   Expected Outcomes  --  Short: Start to exercise at home for 10 min  Long: Work stamina up to 30  min of exercise.  Short: Start to exercise at home 10 min 3x week.  Long: Build up strength and stamina.  Short: Return to in-person rehab regularly.  Long: Continue to build up stamina.  Short: Use RPE daily to regulate intensity. Long: Follow program prescription in THR.   [place] Name 07/02/19 1146 07/15/19 1415 07/29/19 1540 08/12/19 1151       Exercise Goal Re-Evaluation   Exercise Goals Review  Increase Physical Activity;Increase Strength and Stamina;Able to understand and use rate of perceived exertion (RPE) scale;Able to understand and use Dyspnea scale;Knowledge and understanding of Target Heart Rate Range (THRR);Able to check pulse independently;Understanding of Exercise Prescription  Increase Physical Activity;Increase Strength and Stamina;Understanding of Exercise Prescription  Increase Physical Activity;Increase Strength and Stamina;Understanding of Exercise Prescription  Increase Physical Activity;Increase Strength and Stamina;Understanding of Exercise Prescription    Comments  Aiyannah is tolerating exercise  well so far.  Staff will monitor progress.  Zelina is doing well in rehab.  She is enjoying her classmates and starting to like her exercise.  She is up to 2 METs on the BioStep and NuStep.  We will continue to monitor her progress.  Luca has been doing well in rehab.  She continues to enjoy her classmates and misses it when she is out.  She is now on the treadmill for the full 15 min!!  We will continue to montior her progress.  Suesan has found that coming to the structured class has been very helpful!  She finds that she gets lost in her to-dos at home. We did talk about using the videos more at home at least twice a week and also resent her the links to help too.  She is feeling better and has already started to notice changes and improve stamina.    Expected Outcomes  Short :  attend consistently Long:  improve stamina  Short: Continue to push self in class Long: Continue to improve stamina.  Short: Continue to improve workloads.  Long: Continue to improve stamina.  Short: Add in chair routine at home  Long: Continue to improve stamina.       Discharge Exercise Prescription (Final Exercise Prescription Changes): Exercise Prescription Changes - 08/14/19 0900      Response to Exercise   Blood Pressure (Admit)  154/92  Blood Pressure (Exercise)  164/82    Blood Pressure (Exit)  148/78    Heart Rate (Admit)  102 bpm    Heart Rate (Exercise)  110 bpm    Heart Rate (Exit)  83 bpm    Oxygen Saturation (Admit)  100 %    Oxygen Saturation (Exercise)  95 %    Oxygen Saturation (Exit)  95 %    Rating of Perceived Exertion (Exercise)  14    Perceived Dyspnea (Exercise)  1    Symptoms  leg and back pain    Duration  Continue with 30 min of aerobic exercise without signs/symptoms of physical distress.    Intensity  THRR unchanged      Progression   Progression  Continue to progress workloads to maintain intensity without signs/symptoms of physical distress.    Average METs  1.55      Resistance  Training   Training Prescription  Yes    Weight  3 lb    Reps  10-15      Interval Training   Interval Training  No      Treadmill   MPH  0.6    Minutes  2    METs  1.4      T5 Nustep   Level  3    Minutes  15    METs  1.7      Home Exercise Plan   Plans to continue exercise at  Home (comment)   walking, videos   Frequency  Add 3 additional days to program exercise sessions.    Initial Home Exercises Provided  05/06/19       Nutrition:  Target Goals: Understanding of nutrition guidelines, daily intake of sodium <1524m, cholesterol <2073m calories 30% from fat and 7% or less from saturated fats, daily to have 5 or more servings of fruits and vegetables.  Education: Controlling Sodium/Reading Food Labels -Group verbal and written material supporting the discussion of sodium use in heart healthy nutrition. Review and explanation with models, verbal and written materials for utilization of the food label.   Education: General Nutrition Guidelines/Fats and Fiber: -Group instruction provided by verbal, written material, models and posters to present the general guidelines for heart healthy nutrition. Gives an explanation and review of dietary fats and fiber.   Biometrics: Pre Biometrics - 04/06/19 1717      Pre Biometrics   Height  5' 7" (1.702 m)    Weight  (!) 374 lb 12.8 oz (170 kg)    BMI (Calculated)  58.69    Single Leg Stand  1.5 seconds        Nutrition Therapy Plan and Nutrition Goals: Nutrition Therapy & Goals - 05/04/19 1300      Nutrition Therapy   Diet  DM, HH, low Na    Drug/Food Interactions  Statins/Certain Fruits    Protein (specify units)  130g    Fiber  25 grams    Whole Grain Foods  3 servings    Saturated Fats  12 max. grams    Fruits and Vegetables  5 servings/day    Sodium  1.5 grams      Personal Nutrition Goals   Nutrition Goal  ST: add 1 fruit/vegetable serving LT: Would like to be under 165lb    Comments  [employer]. Pt  reports starting [email or web address] (lose 4% in six months). MyPlate method. 3 meals and 2-3 snacks. Pt reports wanting to meal prep. Discussed DM friendly eating, honoring hunger, expectations. Discussed  some meal and snack ideas. will follow up in 2 weeks for more education and goals.      Intervention Plan   Intervention  Prescribe, educate and counsel regarding individualized specific dietary modifications aiming towards targeted core components such as weight, hypertension, lipid management, diabetes, heart failure and other comorbidities.;Nutrition handout(s) given to patient.    Expected Outcomes  Short Term Goal: Understand basic principles of dietary content, such as calories, fat, sodium, cholesterol and nutrients.;Short Term Goal: A plan has been developed with personal nutrition goals set during dietitian appointment.;Long Term Goal: Adherence to prescribed nutrition plan.       Nutrition Assessments:   MEDIFICTS Score Key:          ?70 Need to make dietary changes          40-70 Heart Healthy Diet         ? 40 Therapeutic Level Cholesterol Diet  Nutrition Goals Re-Evaluation: Nutrition Goals Re-Evaluation    [place] Name 05/18/19 1302 07/21/19 1233 08/12/19 1045         Goals   Nutrition Goal  ST: continue to not drink pop  LT: Would like to be under 165lb  ST: continue to not drink pop  LT: Would like to be under 165lb  ST: continue to not drink pop  LT: Would like to be under 165lb     Comment  Able to add 1 F/V a day: strawberries, blueberries, bananas, maderines, cucumber, salads (3x/week), spinach. Pt reports energy levels are good. Pt reports she is emotionally drained; recent loss, friend with new breast cancer diagnosis, and son is a "handful". Pt reports having a good support system. Pt reports not drinking pop - discussed other options for drinks so she doesnt get bored (hibiscus tea). Pt reports lost 8 lbs, still has strength. Pt would like to continue with pop as a goal as it has  been hard for her.  Continue with current changes  Continue with current changes     Expected Outcome  ST: continue to not drink pop  LT: Would like to be under 165lb  ST: continue to not drink pop  LT: Would like to be under 165lb  ST: continue to not drink pop  LT: Would like to be under 165lb        Nutrition Goals Discharge (Final Nutrition Goals Re-Evaluation): Nutrition Goals Re-Evaluation - 08/12/19 1045      Goals   Nutrition Goal  ST: continue to not drink pop  LT: Would like to be under 165lb    Comment  Continue with current changes    Expected Outcome  ST: continue to not drink pop  LT: Would like to be under 165lb       Psychosocial: Target Goals: Acknowledge presence or absence of significant depression and/or stress, maximize coping skills, provide positive support system. Participant is able to verbalize types and ability to use techniques and skills needed for reducing stress and depression.   Education: Depression - Provides group verbal and written instruction on the correlation between heart/lung disease and depressed mood, treatment options, and the stigmas associated with seeking treatment.   Education: Sleep Hygiene -Provides group verbal and written instruction about how sleep can affect your health.  Define sleep hygiene, discuss sleep cycles and impact of sleep habits. Review good sleep hygiene tips.    Education: Stress and Anxiety: - Provides group verbal and written instruction about the health risks of elevated stress and causes of high stress.  Discuss the correlation between heart/lung disease and anxiety and treatment options. Review healthy ways to manage with stress and anxiety.   Initial Review & Psychosocial Screening: Initial Psych Review & Screening - 07/06/19 0951      Initial Review   Current issues with  Current Depression;Current Psychotropic Meds;History of Depression;Current Stress Concerns;Current Sleep Concerns    Source of Stress  Concerns  Chronic Illness;Occupation;Family;Unable to perform yard/household activities    Comments  She is out of work with a sever autistic son. She is generally negative and is trying to be more positive. She talks to a therapist and takes medsication to help her cope better. She is determined to exercise, get weight off and be more positive.      Family Dynamics   Good Support System?  Yes    Comments  Patient sees a therapist and is taking medication to help support her. She is excited to exercise and be less short of breath.      Barriers   Psychosocial barriers to participate in program  The patient should benefit from training in stress management and relaxation.      Screening Interventions   Interventions  Encouraged to exercise;To provide support and resources with identified psychosocial needs;Provide feedback about the scores to participant    Expected Outcomes  Short Term goal: Utilizing psychosocial counselor, staff and physician to assist with identification of specific Stressors or current issues interfering with healing process. Setting desired goal for each stressor or current issue identified.;Long Term Goal: Stressors or current issues are controlled or eliminated.;Short Term goal: Identification and review with participant of any Quality of Life or Depression concerns found by scoring the questionnaire.;Long Term goal: The participant improves quality of Life and PHQ9 Scores as seen by post scores and/or verbalization of changes       Quality of Life Scores:  Scores of 19 and below usually indicate a poorer quality of life in these areas.  A difference of  2-3 points is a clinically meaningful difference.  A difference of 2-3 points in the total score of the Quality of Life Index has been associated with significant improvement in overall quality of life, self-image, physical symptoms, and general health in studies assessing change in quality of life.  PHQ-9: Recent Review  Flowsheet Data    Depression screen [facility] 2/9 07/06/2019 04/06/2019 03/30/2019 03/17/2018   Decreased Interest _0 Down, Depressed, Hopeless _1 PHQ - 2 Score _2 Altered sleeping _3 Tired, decreased energy _4 Change in appetite _5 Feeling bad or failure about yourself  _6 Trouble concentrating _7 Moving slowly or fidgety/restless _8 Suicidal thoughts 0 0 0 0   PHQ-9 Score _9 Difficult doing work/chores Extremely dIfficult Extremely dIfficult Extremely dIfficult Not difficult at all     Interpretation of Total Score  Total Score Depression Severity:  1-4 = Minimal depression, 5-9 = Mild depression, 10-14 = Moderate depression, 15-19 = Moderately severe depression, 20-27 = Severe depression   Psychosocial Evaluation and Intervention: Psychosocial Evaluation - 04/01/19 1427      Psychosocial Evaluation & Interventions   Interventions  Stress management education;Encouraged to exercise with the program and follow exercise prescription    Comments  Honesty is coming  to rehab for heart failure and SOB.  She wants to get stronger and improve her mobility and breathing.  She struggles with her sleeping and causes her fall asleep during the day and at work.  She has currently quit her job as she was not functioning.  She wants to be able to go and do with her kids and family.    Expected Outcomes  Short: Attend rehab regularly  Long: Improve overall stamina    Continue Psychosocial Services   Follow up required by staff       Psychosocial Re-Evaluation: Psychosocial Re-Evaluation    [place] Name 08/12/19 1157             Psychosocial Re-Evaluation   Current issues with  Current Depression;Current Anxiety/Panic;Current Stress Concerns;Current Sleep Concerns       Comments  Deauna continues to see her therapist regularly and feels that she is well managed on her current medication regimen.  Her son is doing better and had a good  day yesterday, but still has his moments and these really fluster her.  She continues to work on her positive self talk, she is becoming more aware of it now and just needs to work on being more mindful about it.  She is on a journey for herself and seems to be committed to making these changes.       Expected Outcomes  Short: Work on positive self talk  Long: Continue to find positive coping skills.       Interventions  Stress management education;Encouraged to attend Cardiac Rehabilitation for the exercise       Continue Psychosocial Services   Follow up required by staff          Psychosocial Discharge (Final Psychosocial Re-Evaluation): Psychosocial Re-Evaluation - 08/12/19 1157      Psychosocial Re-Evaluation   Current issues with  Current Depression;Current Anxiety/Panic;Current Stress Concerns;Current Sleep Concerns    Comments  Sherrika continues to see her therapist regularly and feels that she is well managed on her current medication regimen.  Her son is doing better and had a good day yesterday, but still has his moments and these really fluster her.  She continues to work on her positive self talk, she is becoming more aware of it now and just needs to work on being more mindful about it.  She is on a journey for herself and seems to be committed to making these changes.    Expected Outcomes  Short: Work on positive self talk  Long: Continue to find positive coping skills.    Interventions  Stress management education;Encouraged to attend Cardiac Rehabilitation for the exercise    Continue Psychosocial Services   Follow up required by staff       Education: Education Goals: Education classes will be provided on a weekly basis, covering required topics. Participant will state understanding/return demonstration of topics presented.  Learning Barriers/Preferences: Learning Barriers/Preferences - 04/01/19 1414      Learning Barriers/Preferences   Learning Barriers  Sight   glasses    Learning Preferences  Skilled Demonstration;Video       General Pulmonary Education Topics:  Infection Prevention: - Provides verbal and written material to individual with discussion of infection control including proper hand washing and proper equipment cleaning during exercise session.   Pulmonary Rehab from 04/06/2019 in [facility] Cardiac and Pulmonary Rehab  Date  04/06/19  Educator  AS  Instruction Review Code  1- Verbalizes Understanding      Falls Prevention: -  Provides verbal and written material to individual with discussion of falls prevention and safety.   Pulmonary Rehab from 04/06/2019 in [facility] Cardiac and Pulmonary Rehab  Date  04/06/19  Educator  AS  Instruction Review Code  1- Verbalizes Understanding      Chronic Lung Diseases: - Group verbal and written instruction to review updates, respiratory medications, advancements in procedures and treatments. Discuss use of supplemental oxygen including available portable oxygen systems, continuous and intermittent flow rates, concentrators, personal use and safety guidelines. Review proper use of inhaler and spacers. Provide informative websites for self-education.    Energy Conservation: - Provide group verbal and written instruction for methods to conserve energy, plan and organize activities. Instruct on pacing techniques, use of adaptive equipment and posture/positioning to relieve shortness of breath.   Triggers and Exacerbations: - Group verbal and written instruction to review types of environmental triggers and ways to prevent exacerbations. Discuss weather changes, air quality and the benefits of nasal washing. Review warning signs and symptoms to help prevent infections. Discuss techniques for effective airway clearance, coughing, and vibrations.   AED/CPR: - Group verbal and written instruction with the use of models to demonstrate the basic use of the AED with the basic ABC's of resuscitation.   Anatomy and  Physiology of the Lungs: - Group verbal and written instruction with the use of models to provide basic lung anatomy and physiology related to function, structure and complications of lung disease.   Anatomy & Physiology of the Heart: - Group verbal and written instruction and models provide basic cardiac anatomy and physiology, with the coronary electrical and arterial systems. Review of Valvular disease and Heart Failure   Cardiac Medications: - Group verbal and written instruction to review commonly prescribed medications for heart disease. Reviews the medication, class of the drug, and side effects.   Other: -Provides group and verbal instruction on various topics (see comments)   Knowledge Questionnaire Score: Knowledge Questionnaire Score - 04/06/19 1711      Knowledge Questionnaire Score   Pre Score  16/18        Core Components/Risk Factors/Patient Goals at Admission: Personal Goals and Risk Factors at Admission - 04/06/19 1714      Core Components/Risk Factors/Patient Goals on Admission    Weight Management  Yes    Intervention  Weight Management/Obesity: Establish reasonable short term and long term weight goals.    Admit Weight  374 lb 12.8 oz (170 kg)    Goal Weight: Short Term  365 lb (165.6 kg)    Goal Weight: Long Term  355 lb (161 kg)    Expected Outcomes  Short Term: Continue to assess and modify interventions until short term weight is achieved;Long Term: Adherence to nutrition and physical activity/exercise program aimed toward attainment of established weight goal;Weight Maintenance: Understanding of the daily nutrition guidelines, which includes 25-35% calories from fat, 7% or less cal from saturated fats, less than 227m cholesterol, less than 1.5gm of sodium, & 5 or more servings of fruits and vegetables daily;Weight Loss: Understanding of general recommendations for a balanced deficit meal plan, which promotes 1-2 lb weight loss per week and includes a  negative energy balance of [phone] kcal/d;Understanding recommendations for meals to include 15-35% energy as protein, 25-35% energy from fat, 35-60% energy from carbohydrates, less than 2058mof dietary cholesterol, 20-35 gm of total fiber daily;Understanding of distribution of calorie intake throughout the day with the consumption of 4-5 meals/snacks;Weight Gain: Understanding of general recommendations for a high calorie,  high protein meal plan that promotes weight gain by distributing calorie intake throughout the day with the consumption for 4-5 meals, snacks, and/or supplements       Education:Diabetes - Individual verbal and written instruction to review signs/symptoms of diabetes, desired ranges of glucose level fasting, after meals and with exercise. Acknowledge that pre and post exercise glucose checks will be done for 3 sessions at entry of program.   Education: Know Your Numbers and Risk Factors: -Group verbal and written instruction about important numbers in your health.  Discussion of what are risk factors and how they play a role in the disease process.  Review of Cholesterol, Blood Pressure, Diabetes, and BMI and the role they play in your overall health.   Core Components/Risk Factors/Patient Goals Review:  Goals and Risk Factor Review    [place] Name 05/27/19 1205 06/24/19 1147 08/12/19 1155         Core Components/Risk Factors/Patient Goals Review   Personal Goals Review  Weight Management/Obesity;Hypertension;Heart Failure  Weight Management/Obesity;Hypertension;Heart Failure  Weight Management/Obesity;Hypertension;Heart Failure;Improve shortness of breath with ADL's     Review  She was discouraged today as her weight was up 2 lb and she was feeling more SOB than normal.  We talked about possible causes and she admitted to doing more at home the last few days (more than her normal) and thinks she has overdone it.  We talked about DOMS and how it can make you feel worse for a few  days but it will improve.  This is also a symptoms of her heart failure so we talked about combatting that as well.  She is going to give it another day to see if she improves and move a little more today to help release soreness and get fluid moving out.  She has not been checking her blood pressures routinely but will start to get into that habit since the app will remind her to track it.  She is eager to get moving and to start feeling better again.  Her weight has been going up and down and she wants to lose the weight for good.  She is now committed to coming to in person rehab.  Mattie is doing better overall.  She is feeling better and working on the weight loss.  She went up some and got discouraged, but has already started to lose it again.  Her pressures have been good.  She monitoring her heart failure symptoms and thinks her swelling is improving. She is still having SOB with any activity but it is improving.     Expected Outcomes  Short: Watch weight closely with heart failure.  Long: Manage heart failure  Short: Work on weight loss.  Long: Continue to manage heart failure.  Short: Work on weight loss.  Long: Continue to manage heart failure.        Core Components/Risk Factors/Patient Goals at Discharge (Final Review):  Goals and Risk Factor Review - 08/12/19 1155      Core Components/Risk Factors/Patient Goals Review   Personal Goals Review  Weight Management/Obesity;Hypertension;Heart Failure;Improve shortness of breath with ADL's    Review  Srishti is doing better overall.  She is feeling better and working on the weight loss.  She went up some and got discouraged, but has already started to lose it again.  Her pressures have been good.  She monitoring her heart failure symptoms and thinks her swelling is improving. She is still having SOB with any activity but it is  improving.    Expected Outcomes  Short: Work on weight loss.  Long: Continue to manage heart failure.       ITP  Comments: ITP Comments    [place] Name 04/01/19 1440 04/06/19 1718 04/14/19 1420 04/22/19 1246 04/29/19 0927   ITP Comments  Completed virtual orientation today.  Documentation for diagnosis can be found in [facility] encounter 11/17.  EP eval scheduled for 12/7 at 3pm.  Completed 6MWT.  Initital ITP created and sent to Dr Dortha Schwalbe called to day and wants to wait until after the first of the year to reconsider the program.  She is concerned about the number of people at the main entrance to the hospital, valet, etc.  She felt very safe in the Rehab area.  Veronika will be out until after the new year.  30 day review competed . ITP sent to Dr Emily Filbert for review, changes as needed and ITP approval signature   [place] Name 05/04/19 1340 05/06/19 1148 05/18/19 1335 05/27/19 1205 06/24/19 1146   ITP Comments  Completed initial RD eval  Completed virtual appt today.  Reviewed Virtual Home Exercise Guidelines and program.  Maryssa wants to do the fully virtual program while COVID-19 is still high as she is not comfortable coming into the hospital at this time.  Once things calm down, she would like to do the in person program.  Enrolled in [employer].  Completed 2 week f/u  Completed virtual visit today.  Next follow up scheduled with Melissa on 2/1 and me on 2/10.  She would like to see if she can change to a Thurs with Melissa so it's not as long between calls.  Completed virtual follow up today.  Nyashia is ready to return to in person rehab.  She is scheduled to restart next week.  We will do a walk test and new ITP on her first visit.   [place] Name 06/29/19 [phone] 07/22/19 1044 08/19/19 0640       ITP Comments  First full day of exercise!  Patient was oriented to gym and equipment including functions, settings, policies, and procedures.  Patient's individual exercise prescription and treatment plan were reviewed.  All starting workloads were established based on the results of the 6 minute walk test done at initial orientation  visit.  The plan for exercise progression was also introduced and progression will be customized based on patient's performance and goals.  30 day chart review completed. ITP sent to Dr Zachery Dakins Medical Director, for review,changes as needed and signature.  30 Day review completed. Medical Director review done, changes made as directed,and approval shown by signature of Market researcher.        Comments:

## 2019-08-20 ENCOUNTER — Ambulatory Visit (INDEPENDENT_AMBULATORY_CARE_PROVIDER_SITE_OTHER): Payer: BC Managed Care – PPO | Admitting: Psychology

## 2019-08-20 DIAGNOSIS — F3289 Other specified depressive episodes: Secondary | ICD-10-CM

## 2019-08-20 DIAGNOSIS — M79604 Pain in right leg: Secondary | ICD-10-CM | POA: Diagnosis not present

## 2019-08-20 DIAGNOSIS — M79605 Pain in left leg: Secondary | ICD-10-CM | POA: Diagnosis not present

## 2019-08-20 DIAGNOSIS — M545 Low back pain: Secondary | ICD-10-CM | POA: Diagnosis not present

## 2019-08-20 DIAGNOSIS — M25512 Pain in left shoulder: Secondary | ICD-10-CM | POA: Diagnosis not present

## 2019-08-21 ENCOUNTER — Encounter: Payer: BC Managed Care – PPO | Admitting: *Deleted

## 2019-08-21 ENCOUNTER — Other Ambulatory Visit: Payer: Self-pay

## 2019-08-21 DIAGNOSIS — R0600 Dyspnea, unspecified: Secondary | ICD-10-CM | POA: Diagnosis not present

## 2019-08-21 DIAGNOSIS — Z713 Dietary counseling and surveillance: Secondary | ICD-10-CM | POA: Diagnosis not present

## 2019-08-21 DIAGNOSIS — I872 Venous insufficiency (chronic) (peripheral): Secondary | ICD-10-CM | POA: Diagnosis not present

## 2019-08-21 DIAGNOSIS — I5032 Chronic diastolic (congestive) heart failure: Secondary | ICD-10-CM

## 2019-08-21 DIAGNOSIS — E1159 Type 2 diabetes mellitus with other circulatory complications: Secondary | ICD-10-CM | POA: Diagnosis not present

## 2019-08-21 DIAGNOSIS — I509 Heart failure, unspecified: Secondary | ICD-10-CM | POA: Diagnosis not present

## 2019-08-21 DIAGNOSIS — Z6841 Body Mass Index (BMI) 40.0 and over, adult: Secondary | ICD-10-CM | POA: Diagnosis not present

## 2019-08-21 NOTE — Progress Notes (Signed)
Daily Session Note  Patient Details  Name: Brandy [name] MRN: [id number] Date of Birth: 02-17-1966 Referring Provider:     Pulmonary Rehab from 04/06/2019 in [facility]  Referring Provider  Gollan      Encounter Date: 08/21/2019  Check In: Session Check In - 08/21/19 0946      Check-In   Supervising physician immediately available to respond to emergencies  See telemetry face sheet for immediately available ER MD    Location  [facility]-Cardiac & Pulmonary Rehab    Staff Present  Alberteen Sam, MA, [place], CCRP, CCET;Meredith Dennis, RN BSN;Joseph Hood RCP,RRT,BSRT;Melissa Prospect RDN, LDN;Other   Basilia Jumbo RN, BSN   Virtual Visit  No    Medication changes reported      No    Fall or balance concerns reported     No    Warm-up and Cool-down  Performed on first and last piece of equipment    Resistance Training Performed  Yes    VAD Patient?  No    PAD/SET Patient?  No      Pain Assessment   Currently in Pain?  No/denies          Social History   Tobacco Use  Smoking Status Never Smoker  Smokeless Tobacco Never Used    Goals Met:  Independence with exercise equipment Exercise tolerated well No report of cardiac concerns or symptoms  Goals Unmet:  Not Applicable  Comments: Pt able to follow exercise prescription today without complaint.  Will continue to monitor for progression.    Dr. Emily Filbert is Medical Director for [facility].

## 2019-08-24 ENCOUNTER — Telehealth: Payer: Self-pay

## 2019-08-24 NOTE — Telephone Encounter (Signed)
Loris husband called to say she didn't sleep well last night and wouldn't be at [employer] today

## 2019-08-25 ENCOUNTER — Encounter (INDEPENDENT_AMBULATORY_CARE_PROVIDER_SITE_OTHER): Payer: Self-pay | Admitting: Family Medicine

## 2019-08-25 ENCOUNTER — Other Ambulatory Visit: Payer: Self-pay

## 2019-08-25 ENCOUNTER — Ambulatory Visit (INDEPENDENT_AMBULATORY_CARE_PROVIDER_SITE_OTHER): Payer: BC Managed Care – PPO | Admitting: Family Medicine

## 2019-08-25 VITALS — BP 124/77 | HR 83 | Temp 98.0°F | Ht 66.0 in | Wt 383.0 lb

## 2019-08-25 DIAGNOSIS — I10 Essential (primary) hypertension: Secondary | ICD-10-CM

## 2019-08-25 DIAGNOSIS — E119 Type 2 diabetes mellitus without complications: Secondary | ICD-10-CM | POA: Diagnosis not present

## 2019-08-25 DIAGNOSIS — Z9189 Other specified personal risk factors, not elsewhere classified: Secondary | ICD-10-CM | POA: Diagnosis not present

## 2019-08-25 DIAGNOSIS — E1169 Type 2 diabetes mellitus with other specified complication: Secondary | ICD-10-CM

## 2019-08-25 DIAGNOSIS — E559 Vitamin D deficiency, unspecified: Secondary | ICD-10-CM | POA: Diagnosis not present

## 2019-08-25 DIAGNOSIS — R0602 Shortness of breath: Secondary | ICD-10-CM

## 2019-08-25 DIAGNOSIS — I152 Hypertension secondary to endocrine disorders: Secondary | ICD-10-CM

## 2019-08-25 DIAGNOSIS — R5383 Other fatigue: Secondary | ICD-10-CM | POA: Diagnosis not present

## 2019-08-25 DIAGNOSIS — Z6841 Body Mass Index (BMI) 40.0 and over, adult: Secondary | ICD-10-CM

## 2019-08-25 DIAGNOSIS — Z0289 Encounter for other administrative examinations: Secondary | ICD-10-CM

## 2019-08-25 DIAGNOSIS — E785 Hyperlipidemia, unspecified: Secondary | ICD-10-CM

## 2019-08-25 DIAGNOSIS — E1159 Type 2 diabetes mellitus with other circulatory complications: Secondary | ICD-10-CM | POA: Diagnosis not present

## 2019-08-25 DIAGNOSIS — F3289 Other specified depressive episodes: Secondary | ICD-10-CM | POA: Diagnosis not present
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
## 2019-08-26 ENCOUNTER — Encounter: Payer: BC Managed Care – PPO | Admitting: *Deleted

## 2019-08-26 DIAGNOSIS — M6281 Muscle weakness (generalized): Secondary | ICD-10-CM | POA: Diagnosis not present

## 2019-08-26 DIAGNOSIS — R0609 Other forms of dyspnea: Secondary | ICD-10-CM

## 2019-08-26 DIAGNOSIS — M5417 Radiculopathy, lumbosacral region: Secondary | ICD-10-CM | POA: Diagnosis not present

## 2019-08-26 DIAGNOSIS — M62838 Other muscle spasm: Secondary | ICD-10-CM | POA: Diagnosis not present

## 2019-08-26 DIAGNOSIS — R0600 Dyspnea, unspecified: Secondary | ICD-10-CM | POA: Diagnosis not present

## 2019-08-26 DIAGNOSIS — Z713 Dietary counseling and surveillance: Secondary | ICD-10-CM | POA: Diagnosis not present

## 2019-08-26 DIAGNOSIS — I5032 Chronic diastolic (congestive) heart failure: Secondary | ICD-10-CM

## 2019-08-26 DIAGNOSIS — E1159 Type 2 diabetes mellitus with other circulatory complications: Secondary | ICD-10-CM | POA: Diagnosis not present

## 2019-08-26 DIAGNOSIS — Z6841 Body Mass Index (BMI) 40.0 and over, adult: Secondary | ICD-10-CM | POA: Diagnosis not present

## 2019-08-26 DIAGNOSIS — M545 Low back pain: Secondary | ICD-10-CM | POA: Diagnosis not present

## 2019-08-26 DIAGNOSIS — I872 Venous insufficiency (chronic) (peripheral): Secondary | ICD-10-CM | POA: Diagnosis not present

## 2019-08-26 DIAGNOSIS — I509 Heart failure, unspecified: Secondary | ICD-10-CM | POA: Diagnosis not present

## 2019-08-26 LAB — CBC WITH DIFFERENTIAL/PLATELET
Basophils Absolute: 0.1 10*3/uL (ref 0.0–0.2)
Basos: 1 %
EOS (ABSOLUTE): 0.2 10*3/uL (ref 0.0–0.4)
Eos: 2 %
Hematocrit: 39.4 % (ref 34.0–46.6)
Hemoglobin: 12.7 g/dL (ref 11.1–15.9)
Immature Grans (Abs): 0 10*3/uL (ref 0.0–0.1)
Immature Granulocytes: 0 %
Lymphocytes Absolute: 1.8 10*3/uL (ref 0.7–3.1)
Lymphs: 22 %
MCH: 29.8 pg (ref 26.6–33.0)
MCHC: 32.2 g/dL (ref 31.5–35.7)
MCV: 93 fL (ref 79–97)
Monocytes Absolute: 0.4 10*3/uL (ref 0.1–0.9)
Monocytes: 5 %
Neutrophils Absolute: 5.6 10*3/uL (ref 1.4–7.0)
Neutrophils: 70 %
Platelets: 296 10*3/uL (ref 150–450)
RBC: 4.26 x10E6/uL (ref 3.77–5.28)
RDW: 12.1 % (ref 11.7–15.4)
WBC: 8 10*3/uL (ref 3.4–10.8)

## 2019-08-26 LAB — COMPREHENSIVE METABOLIC PANEL
ALT: 12 IU/L (ref 0–32)
AST: 12 IU/L (ref 0–40)
Albumin/Globulin Ratio: 1.3 (ref 1.2–2.2)
Albumin: 4 g/dL (ref 3.8–4.9)
Alkaline Phosphatase: 110 IU/L (ref 39–117)
BUN/Creatinine Ratio: 27 — ABNORMAL HIGH (ref 9–23)
BUN: 22 mg/dL (ref 6–24)
Bilirubin Total: 0.3 mg/dL (ref 0.0–1.2)
CO2: 23 mmol/L (ref 20–29)
Calcium: 9.2 mg/dL (ref 8.7–10.2)
Chloride: 100 mmol/L (ref 96–106)
Creatinine, Ser: 0.83 mg/dL (ref 0.57–1.00)
GFR calc Af Amer: 91 mL/min/{1.73_m2} (ref >59)
GFR calc non Af Amer: 79 mL/min/{1.73_m2} (ref >59)
Globulin, Total: 3 g/dL (ref 1.5–4.5)
Glucose: 170 mg/dL — ABNORMAL HIGH (ref 65–99)
Potassium: 4.8 mmol/L (ref 3.5–5.2)
Sodium: 136 mmol/L (ref 134–144)
Total Protein: 7 g/dL (ref 6.0–8.5)

## 2019-08-26 LAB — INSULIN, RANDOM: INSULIN: 101 u[IU]/mL — ABNORMAL HIGH (ref 2.6–24.9)

## 2019-08-26 LAB — TSH: TSH: 1.72 u[IU]/mL (ref 0.450–4.500)

## 2019-08-26 LAB — LIPID PANEL WITH LDL/HDL RATIO
Cholesterol, Total: 162 mg/dL (ref 100–199)
HDL: 69 mg/dL (ref >39)
LDL Chol Calc (NIH): 73 mg/dL (ref 0–99)
LDL/HDL Ratio: 1.1 ratio (ref 0.0–3.2)
Triglycerides: 114 mg/dL (ref 0–149)
VLDL Cholesterol Cal: 20 mg/dL (ref 5–40)

## 2019-08-26 LAB — HEMOGLOBIN A1C
Est. average glucose Bld gHb Est-mCnc: 177 mg/dL
Hgb A1c MFr Bld: 7.8 % — ABNORMAL HIGH (ref 4.8–5.6)

## 2019-08-26 LAB — VITAMIN D 25 HYDROXY (VIT D DEFICIENCY, FRACTURES): Vit D, 25-Hydroxy: 31.4 ng/mL (ref 30.0–100.0)

## 2019-08-26 LAB — FOLATE: Folate: 16.2 ng/mL (ref >3.0)

## 2019-08-26 LAB — VITAMIN B12: Vitamin B-12: 1520 pg/mL — ABNORMAL HIGH (ref 232–1245)

## 2019-08-26 LAB — T4, FREE: Free T4: 0.89 ng/dL (ref 0.82–1.77)

## 2019-08-26 LAB — T3: T3, Total: 121 ng/dL (ref 71–180)

## 2019-08-26 NOTE — Progress Notes (Signed)
Daily Session Note  Patient Details  Name: Brandy [name] MRN: [id number] Date of Birth: 21-Jul-1965 Referring Provider:     Pulmonary Rehab from 04/06/2019 in [facility] Cardiac and Pulmonary Rehab  Referring Provider  Gollan      Encounter Date: 08/26/2019  Check In: Session Check In - 08/26/19 0948      Check-In   Supervising physician immediately available to respond to emergencies  See telemetry face sheet for immediately available ER MD    Location  [facility]-Cardiac & Pulmonary Rehab    Staff Present  Nada Maclachlan, BA, ACSM CEP, Exercise Physiologist;Joseph Flavia Shipper;Heath Lark, RN, BSN, CCRP    Medication changes reported      No    Fall or balance concerns reported     No    Warm-up and Cool-down  Performed on first and last piece of equipment    Resistance Training Performed  Yes    VAD Patient?  No    PAD/SET Patient?  No      Pain Assessment   Currently in Pain?  No/denies          Social History   Tobacco Use  Smoking Status Never Smoker  Smokeless Tobacco Never Used    Goals Met:  Proper associated with RPD/PD & O2 Sat Independence with exercise equipment Exercise tolerated well No report of cardiac concerns or symptoms  Goals Unmet:  Not Applicable  Comments: Pt able to follow exercise prescription today without complaint.  Will continue to monitor for progression.    Dr. Emily Filbert is Medical Director for [facility] and [facility].

## 2019-08-27 ENCOUNTER — Ambulatory Visit (INDEPENDENT_AMBULATORY_CARE_PROVIDER_SITE_OTHER): Payer: BC Managed Care – PPO | Admitting: Psychology

## 2019-08-27 DIAGNOSIS — F3289 Other specified depressive episodes: Secondary | ICD-10-CM | POA: Diagnosis not present

## 2019-08-27 NOTE — Progress Notes (Signed)
Chief Complaint:   Brandy [name] [name] (MR# [id number]) is a 54 y.o. female who presents for evaluation and treatment of Brandy [name] and related comorbidities. Current BMI is Body mass index is 61.82 kg/m. Brandy [name] [name] has been struggling with her weight for many years and has been unsuccessful in either losing weight, maintaining weight loss, or reaching her healthy weight goal.  Brandy [name] [name] is doing physical therapy almost daily for exercise.  Brandy [name] [name] is currently in the action stage of change and ready to dedicate time achieving and maintaining a healthier weight. Brandy [name] [name] is interested in becoming our patient and working on intensive lifestyle modifications including (but not limited to) diet and exercise for weight loss.  Brandy [name] [name]'s habits were reviewed today and are as follows: Her family eats meals together, she thinks her family will eat healthier with her, her desired weight loss is 223 lbs, she has been heavy most of her life, her heaviest weight ever was 410 pounds, she has significant food cravings issues, she snacks frequently in the evenings, she skips meals frequently, she is frequently drinking liquids with calories, she frequently makes poor food choices, she frequently eats larger portions than normal and she struggles with emotional eating.  Depression Screen Brandy [name]'s Food and Mood (modified PHQ-9) score was 25.  Depression screen Brandy [name] [name] 2/9 08/25/2019  Decreased Interest 3  Down, Depressed, Hopeless 3  PHQ - 2 Score 6  Altered sleeping 3  Tired, decreased energy 3  Change in appetite 3  Feeling bad or failure about yourself  2  Trouble concentrating 3  Moving slowly or fidgety/restless 3  Suicidal thoughts 2  PHQ-9 Score 25  Difficult doing work/chores -  Some recent data might be hidden   Subjective:   1. Other fatigue Brandy [name] [name] admits to daytime somnolence and admits to waking up still tired. Patent has a history of symptoms of daytime fatigue. Brandy [name] generally gets 7 hours of sleep per  night, and states that she has generally restful sleep. Snoring is present. Apneic episodes are not present. Epworth Sleepiness Score is 13.  2. Shortness of breath on exertion Brandy [name] [name] notes increasing shortness of breath with exercising and seems to be worsening over time with weight gain. She notes getting out of breath sooner with activity than she used to. This has not gotten worse recently. Brandy [name] [name] denies shortness of breath at rest or orthopnea.  3. Type 2 diabetes mellitus without complication, without long-term current use of insulin (HCC) Brandy [name] [name] is on metformin, glipizide, and Lantus, and she is on a CGM. Her fasting BGs mostly range between 110 and 140's, and 2 hour post prandial mostly range between 150 and 170's. She denies hypoglycemia.   4. Hyperlipidemia associated with type 2 diabetes mellitus (HCC) Brandy [name] [name] is on statin and she denies chest pain. She is working on diet.  5. Hypertension associated with diabetes (HCC) Brandy [name] [name]'s blood pressure is well controlled on her medications. She denies chest pain.  6. Vitamin D deficiency Brandy [name] [name] is on Vit D, and she notes fatigue. She denies nausea, vomiting, or muscle weakness.  7. Other depression, with emotional eating Brandy [name] [name] has a very high PHQ-9 score of 25. She feels her weight frequently makes her think life isn't worth living. She sees a therapist regularly. She denies suicidal ideas.  8. At risk for hypoglycemia Brandy [name] [name] is at increased risk for hypoglycemia due to insulin use and weight loss. Brandy [name] [name] is currently taking insulin.   Assessment/Plan:   1. Other fatigue Brandy [name] [name] does feel that her  weight is causing her energy to be lower than it should be. Fatigue may be related to Brandy [name], depression or many other causes. Labs will be ordered, and in the meanwhile, Brandy [name] [name] will focus on self care including making healthy food choices, increasing physical activity and focusing on stress reduction.  - EKG 12-Lead - Vitamin B12 - CBC with  Differential/Platelet - Folate - T3 - T4, free - TSH  2. Shortness of breath on exertion Brandy [name] [name] does feel that she gets out of breath more easily that she used to when she exercises. Brandy [name] [name]'s shortness of breath appears to be Brandy [name] related and exercise induced. She has agreed to work on weight loss and gradually increase exercise to treat her exercise induced shortness of breath. Will continue to monitor closely.  3. Type 2 diabetes mellitus without complication, without long-term current use of insulin (HCC) Brandy [name] [name] will continue her medications, and will watch for signs of hypoglycemia. Good blood sugar control is important to decrease the likelihood of diabetic complications such as nephropathy, neuropathy, limb loss, blindness, coronary artery disease, and death. Intensive lifestyle modification including diet, exercise and weight loss are the first line of treatment for diabetes. We will check labs today.  - Comprehensive metabolic panel - Hemoglobin A1c - Insulin, random  4. Hyperlipidemia associated with type 2 diabetes mellitus ([place]) Cardiovascular risk and specific lipid/LDL goals reviewed. We discussed several lifestyle modifications today. Brandy [name] [name] will start her diet, and will continue to work on exercise and weight loss efforts. We will check labs today. Orders and follow up as documented in patient record.   - Lipid Panel With LDL/HDL Ratio  5. Hypertension associated with diabetes ([place]) Brandy [name] [name] will start her diet, and will continue to work on healthy weight loss and exercise to improve blood pressure control. We will watch for signs of hypotension as she continues her lifestyle modifications. We will check labs today.  - Comprehensive metabolic panel  6. Vitamin D deficiency Low Vitamin D level contributes to fatigue and are associated with Brandy [name], breast, and colon cancer. Brandy [name] [name] agreed to continue taking Vitamin D and will follow-up for routine testing of Vitamin D, at least 2-3 times  per year to avoid over-replacement. We will check labs today.  - VITAMIN D 25 Hydroxy (Vit-D Deficiency, Fractures)  7. Other depression, with emotional eating Behavior modification techniques were discussed today to help Brandy [name] [name] deal with her emotional/non-hunger eating behaviors. We will refer to Dr. Mallie Mussel, our Bariatric Psychologist for evaluation. Orders and follow up as documented in patient record.   8. At risk for hypoglycemia Brandy [name] [name] was given approximately 30 minutes of counseling today regarding prevention of hypoglycemia. She was advised of symptoms of hypoglycemia. Feleshia was instructed to avoid skipping meals, eat regular protein rich meals and schedule low calorie snacks as needed.   Repetitive spaced learning was employed today to elicit superior Brandy [name] [name] formation and behavioral change  9. Class 3 severe Brandy [name] with serious comorbidity and body mass index (BMI) of 60.0 to 69.9 in adult, unspecified Brandy [name] type (HCC) Brandy [name] [name] is currently in the action stage of change and her goal is to continue with weight loss efforts. I recommend Brandy [name] [name] begin the structured treatment plan as follows:  She has agreed to the Category 3 Plan.  Exercise goals: As is.   Behavioral modification strategies: increasing lean protein intake and meal planning and cooking strategies.  She was informed of the importance of frequent follow-up visits to maximize her success with intensive lifestyle modifications for her multiple health  conditions. She was informed we would discuss her lab results at her next visit unless there is a critical issue that needs to be addressed sooner. Brandy [name] [name] agreed to keep her next visit at the agreed upon time to discuss these results.  Objective:   Blood pressure 124/77, pulse 83, temperature 98 F (36.7 C), temperature source Oral, height 5\' 6"  (1.676 m), weight (!) 383 lb (173.7 kg), SpO2 96 %. Body mass index is 61.82 kg/m.  EKG: Normal sinus rhythm, rate 84 BPM.  Indirect  Calorimeter completed today shows a VO2 of 294 and a REE of 2047.  Her calculated basal metabolic rate is 2048 thus her basal metabolic rate is worse than expected.  General: Cooperative, alert, well developed, in no acute distress. HEENT: Conjunctivae and lids unremarkable. Cardiovascular: Regular rhythm.  Lungs: Normal work of breathing. Neurologic: No focal deficits.   Lab Results  Component Value Date   CREATININE 0.83 08/25/2019   BUN 22 08/25/2019   NA 136 08/25/2019   K 4.8 08/25/2019   CL 100 08/25/2019   CO2 23 08/25/2019   Lab Results  Component Value Date   ALT 12 08/25/2019   AST 12 08/25/2019   ALKPHOS 110 08/25/2019   BILITOT 0.3 08/25/2019   Lab Results  Component Value Date   HGBA1C 7.8 (H) 08/25/2019   HGBA1C 9.2 (A) 06/17/2019   HGBA1C 9.1 (A) 03/18/2019   HGBA1C 8.9 (A) 12/10/2018   HGBA1C 8.5 (H) 08/29/2018   Lab Results  Component Value Date   INSULIN 101.0 (H) 08/25/2019   Lab Results  Component Value Date   TSH 1.720 08/25/2019   Lab Results  Component Value Date   CHOL 162 08/25/2019   HDL 69 08/25/2019   LDLCALC 73 08/25/2019   LDLDIRECT 108.3 09/14/2010   TRIG 114 08/25/2019   CHOLHDL 3 03/18/2019   Lab Results  Component Value Date   WBC 8.0 08/25/2019   HGB 12.7 08/25/2019   HCT 39.4 08/25/2019   MCV 93 08/25/2019   PLT 296 08/25/2019   No results found for: IRON, TIBC, FERRITIN  Attestation Statements:   Reviewed by clinician on day of visit: allergies, medications, problem list, medical history, surgical history, family history, social history, and previous encounter notes.   I, 08/27/2019, am acting as transcriptionist for Burt Knack, MD.  I have reviewed the above documentation for accuracy and completeness, and I agree with the above. - Quillian Quince, MD

## 2019-08-28 DIAGNOSIS — M5136 Other intervertebral disc degeneration, lumbar region: Secondary | ICD-10-CM | POA: Diagnosis not present

## 2019-08-28 DIAGNOSIS — Z6841 Body Mass Index (BMI) 40.0 and over, adult: Secondary | ICD-10-CM | POA: Diagnosis not present

## 2019-08-31 ENCOUNTER — Encounter: Payer: BC Managed Care – PPO | Attending: Cardiovascular Disease

## 2019-08-31 ENCOUNTER — Other Ambulatory Visit: Payer: Self-pay

## 2019-08-31 DIAGNOSIS — Z713 Dietary counseling and surveillance: Secondary | ICD-10-CM | POA: Insufficient documentation

## 2019-08-31 DIAGNOSIS — Z6841 Body Mass Index (BMI) 40.0 and over, adult: Secondary | ICD-10-CM | POA: Insufficient documentation

## 2019-08-31 DIAGNOSIS — I5032 Chronic diastolic (congestive) heart failure: Secondary | ICD-10-CM

## 2019-08-31 DIAGNOSIS — E1159 Type 2 diabetes mellitus with other circulatory complications: Secondary | ICD-10-CM | POA: Insufficient documentation

## 2019-08-31 DIAGNOSIS — R0600 Dyspnea, unspecified: Secondary | ICD-10-CM | POA: Insufficient documentation

## 2019-08-31 DIAGNOSIS — I872 Venous insufficiency (chronic) (peripheral): Secondary | ICD-10-CM | POA: Insufficient documentation

## 2019-08-31 DIAGNOSIS — I509 Heart failure, unspecified: Secondary | ICD-10-CM | POA: Insufficient documentation

## 2019-08-31 NOTE — Progress Notes (Signed)
Opened in error

## 2019-08-31 NOTE — Progress Notes (Signed)
Completed nutrition re-evaluation.

## 2019-09-02 ENCOUNTER — Other Ambulatory Visit: Payer: Self-pay | Admitting: Primary Care

## 2019-09-02 DIAGNOSIS — I10 Essential (primary) hypertension: Secondary | ICD-10-CM

## 2019-09-03 ENCOUNTER — Ambulatory Visit (INDEPENDENT_AMBULATORY_CARE_PROVIDER_SITE_OTHER): Payer: BC Managed Care – PPO | Admitting: Psychology

## 2019-09-03 DIAGNOSIS — F3289 Other specified depressive episodes: Secondary | ICD-10-CM

## 2019-09-03 DIAGNOSIS — M545 Low back pain: Secondary | ICD-10-CM | POA: Diagnosis not present

## 2019-09-03 DIAGNOSIS — M25512 Pain in left shoulder: Secondary | ICD-10-CM | POA: Diagnosis not present

## 2019-09-03 DIAGNOSIS — M79605 Pain in left leg: Secondary | ICD-10-CM | POA: Diagnosis not present

## 2019-09-03 DIAGNOSIS — M79604 Pain in right leg: Secondary | ICD-10-CM | POA: Diagnosis not present

## 2019-09-04 ENCOUNTER — Other Ambulatory Visit: Payer: Self-pay

## 2019-09-04 ENCOUNTER — Encounter: Payer: BC Managed Care – PPO | Admitting: *Deleted

## 2019-09-04 DIAGNOSIS — I509 Heart failure, unspecified: Secondary | ICD-10-CM | POA: Diagnosis not present

## 2019-09-04 DIAGNOSIS — R0600 Dyspnea, unspecified: Secondary | ICD-10-CM | POA: Diagnosis not present

## 2019-09-04 DIAGNOSIS — I872 Venous insufficiency (chronic) (peripheral): Secondary | ICD-10-CM | POA: Diagnosis not present

## 2019-09-04 DIAGNOSIS — E1159 Type 2 diabetes mellitus with other circulatory complications: Secondary | ICD-10-CM | POA: Diagnosis not present

## 2019-09-04 DIAGNOSIS — I5032 Chronic diastolic (congestive) heart failure: Secondary | ICD-10-CM

## 2019-09-04 DIAGNOSIS — Z713 Dietary counseling and surveillance: Secondary | ICD-10-CM | POA: Diagnosis not present

## 2019-09-04 DIAGNOSIS — R0609 Other forms of dyspnea: Secondary | ICD-10-CM

## 2019-09-04 DIAGNOSIS — Z6841 Body Mass Index (BMI) 40.0 and over, adult: Secondary | ICD-10-CM | POA: Diagnosis not present

## 2019-09-04 NOTE — Progress Notes (Signed)
Daily Session Note  Patient Details  Name: Brandy [name] MRN: [id number] Date of Birth: 1965/06/04 Referring Provider:     Pulmonary Rehab from 04/06/2019 in [facility] Card[facility]  Referring Provider  Goll[place]      Encounter Date: 09/04/2019  Check In: Session Check In - 09/04/19 0945      Check-In   Supervising physician immediately available to respond to emergencies  See telemetry face sheet for immediately available ER MD    Location  [facility]-Cardiac & Pulmonary Rehab    Staff Present  Renita Papa, RN BSN;Joseph [address] [place], [place], [place], [place], [facility]    Virtual Visit  No    Medication changes reported      No    Fall or balance concerns reported     No    Warm-up and Cool-down  Performed on first and last piece of equipment    Resistance Training Performed  Yes    VAD Patient?  No    PAD/SET Patient?  No      Pain Assessment   Currently in Pain?  No/denies          Social History   Tobacco Use  Smoking Status Never Smoker  Smokeless Tobacco Never Used    Goals Met:  Independence with exercise equipment Exercise tolerated well No report of cardiac concerns or symptoms Strength training completed today  Goals Unmet:  Not Applicable  Comments: Pt able to follow exercise prescription today without complaint.  Will continue to monitor for progression.    Dr. Emily Filbert is Medical Director for [place] and [facility].

## 2019-09-07 ENCOUNTER — Telehealth (INDEPENDENT_AMBULATORY_CARE_PROVIDER_SITE_OTHER): Payer: BC Managed Care – PPO | Admitting: Psychology

## 2019-09-07 ENCOUNTER — Other Ambulatory Visit: Payer: Self-pay

## 2019-09-07 ENCOUNTER — Encounter: Payer: BC Managed Care – PPO | Admitting: *Deleted

## 2019-09-07 DIAGNOSIS — E1159 Type 2 diabetes mellitus with other circulatory complications: Secondary | ICD-10-CM | POA: Diagnosis not present

## 2019-09-07 DIAGNOSIS — I5032 Chronic diastolic (congestive) heart failure: Secondary | ICD-10-CM

## 2019-09-07 DIAGNOSIS — Z6841 Body Mass Index (BMI) 40.0 and over, adult: Secondary | ICD-10-CM | POA: Diagnosis not present

## 2019-09-07 DIAGNOSIS — F331 Major depressive disorder, recurrent, moderate: Secondary | ICD-10-CM

## 2019-09-07 DIAGNOSIS — R0609 Other forms of dyspnea: Secondary | ICD-10-CM

## 2019-09-07 DIAGNOSIS — F5089 Other specified eating disorder: Secondary | ICD-10-CM

## 2019-09-07 DIAGNOSIS — R0600 Dyspnea, unspecified: Secondary | ICD-10-CM | POA: Diagnosis not present

## 2019-09-07 DIAGNOSIS — I872 Venous insufficiency (chronic) (peripheral): Secondary | ICD-10-CM | POA: Diagnosis not present

## 2019-09-07 DIAGNOSIS — I509 Heart failure, unspecified: Secondary | ICD-10-CM | POA: Diagnosis not present

## 2019-09-07 DIAGNOSIS — Z713 Dietary counseling and surveillance: Secondary | ICD-10-CM | POA: Diagnosis not present

## 2019-09-07 NOTE — Progress Notes (Signed)
Daily Session Note  Patient Details  Name: Brandy [name] MRN: [id number] Date of Birth: 1966/04/14 Referring Provider:     Pulmonary Rehab from 04/06/2019 in [facility] Card[facility] Pulmonary Rehab  Referring Provider  Gollan      Encounter Date: 09/07/2019  Check In: Session Check In - 09/07/19 1112      Check-In   Supervising physician immediately available to respond to emergencies  See telemetry face sheet for immediately available ER MD    Location  [facility]-Cardiac & Pulmonary Rehab    Staff Present  Renita Papa, RN BSN;Joseph [address] [place], [place], ACSM CEP, Exercise Physiologist    Virtual Visit  No    Medication changes reported      No    Fall or balance concerns reported     No    Warm-up and Cool-down  Performed on first and last piece of equipment    Resistance Training Performed  Yes    VAD Patient?  No    PAD/SET Patient?  No      Pain Assessment   Currently in Pain?  No/denies          Social History   Tobacco Use  Smoking Status Never Smoker  Smokeless Tobacco Never Used    Goals Met:  Independence with exercise equipment Exercise tolerated well No report of cardiac concerns or symptoms Strength training completed today  Goals Unmet:  Not Applicable  Comments: Pt able to follow exercise prescription today without complaint.  Will continue to monitor for progression.    Dr. Emily Filbert is Medical Director for [place] and [facility].

## 2019-09-08 ENCOUNTER — Encounter (INDEPENDENT_AMBULATORY_CARE_PROVIDER_SITE_OTHER): Payer: Self-pay | Admitting: Family Medicine

## 2019-09-08 ENCOUNTER — Ambulatory Visit (INDEPENDENT_AMBULATORY_CARE_PROVIDER_SITE_OTHER): Payer: BC Managed Care – PPO | Admitting: Family Medicine

## 2019-09-08 VITALS — BP 116/75 | HR 95 | Temp 98.0°F | Ht 66.0 in | Wt 375.0 lb

## 2019-09-08 DIAGNOSIS — Z794 Long term (current) use of insulin: Secondary | ICD-10-CM

## 2019-09-08 DIAGNOSIS — E1169 Type 2 diabetes mellitus with other specified complication: Secondary | ICD-10-CM

## 2019-09-08 DIAGNOSIS — Z9189 Other specified personal risk factors, not elsewhere classified: Secondary | ICD-10-CM | POA: Diagnosis not present

## 2019-09-08 DIAGNOSIS — E7849 Other hyperlipidemia: Secondary | ICD-10-CM | POA: Diagnosis not present

## 2019-09-08 DIAGNOSIS — E559 Vitamin D deficiency, unspecified: Secondary | ICD-10-CM

## 2019-09-08 DIAGNOSIS — Z6841 Body Mass Index (BMI) 40.0 and over, adult: Secondary | ICD-10-CM

## 2019-09-08 MED ORDER — VITAMIN D (ERGOCALCIFEROL) 1.25 MG (50000 UNIT) PO CAPS: 50000.0000 [IU] | ORAL_CAPSULE | ORAL | 0 refills | Status: DC

## 2019-09-08 NOTE — Telephone Encounter (Signed)
Please advise 

## 2019-09-08 NOTE — Progress Notes (Signed)
Chief Complaint:   OBESITY Brandy [name] is here to discuss her progress with her obesity treatment plan along with follow-up of her obesity related diagnoses. Brandy [name] is on the Category 3 Plan and states she is following her eating plan approximately 98% of the time. Brandy [name] states she is at pulmonary rehab and aquatic for 55-60 minutes 4 times per week.  Today's visit was #: 2 Starting weight: 383 lbs Starting date: 08/25/2019 Today's weight: 375 lbs Today's date: 09/08/2019 Total lbs lost to date: 8 Total lbs lost since last in-office visit: 8  Interim History: Brandy [name] felt that dinner  Was the most challenging meal on the Category 3 meal plan, particularly the dinner protein servings. She was able to consume the protein by splitting the evening meal in half and also adding in eggs to the meat. She denies excessive hunger signals over the last two weeks.  Subjective:   1. Type 2 diabetes mellitus with other specified complication, with long-term current use of insulin (HCC) Brandy [name]'s ambulatory BGs range between 110 and 150, and post prandials BGs range between 138 and 202. She denies episodes of hypoglycemia. Her A1c on 08/25/2019 was 7.8. She is on several anti-diabetic medications. I discussed labs with the patient today.  2. Other hyperlipidemia Brandy [name]'s lipid panel on 08/25/2019, showed a total cholesterol of 162, triglycerides 114, HDL 69, and LDL 73. She is currently on atorvastatin 10 mg q daily, and she is tolerating it well. I discussed labs with the patient today.  3. Vitamin D deficiency Brandy [name]'s Vit D level on 08/25/2019 was 31, goal is >50. She is currently on OTC Vit D 1,000 IU q daily, and once weekly prescription Vit D 50,000 IU, and has been on this combination for >6 months.  4. At risk for dehydration Brandy [name] is at risk for dehydration due to elevated BUN/creatinine ratio, weight loss, and obesity.  Assessment/Plan:   1. Type 2 diabetes mellitus with other specified complication, with  long-term current use of insulin (HCC) Good blood sugar control is important to decrease the likelihood of diabetic complications such as nephropathy, neuropathy, limb loss, blindness, coronary artery disease, and death. Intensive lifestyle modification including diet, exercise and weight loss are the first line of treatment for diabetes. Brandy [name] will continue her Category 3 meal plan, and will continue to monitor her home BGs closely.  2. Other hyperlipidemia Cardiovascular risk and specific lipid/LDL goals reviewed. We discussed several lifestyle modifications today. Brandy [name] will continue her current statin therapy, and will continue her Category 3 meal plan. She will continue to work on exercise and weight loss efforts. Orders and follow up as documented in patient record.   3. Vitamin D deficiency Low Vitamin D level contributes to fatigue and are associated with obesity, breast, and colon cancer. Brandy [name] agreed to increase prescription Vitamin D to 50,000 IU every 3 days with no refills. She will follow-up for routine testing of Vitamin D, at least 2-3 times per year to avoid over-replacement. We will recheck labs in 3 months.  - Vitamin D, Ergocalciferol, (DRISDOL) 1.25 MG (50000 UNIT) CAPS capsule; Take 1 capsule (50,000 Units total) by mouth every 3 (three) days.  Dispense: 10 capsule; Refill: 0  4. At risk for dehydration Brandy [name] was given approximately 30 minutes dehydration prevention counseling today. Brandy [name] is at risk for dehydration due to weight loss and current medication(s). She was encouraged to hydrate and monitor fluid status to avoid dehydration as well as weight loss plateaus.   5. Class  3 severe obesity with serious comorbidity and body mass index (BMI) of 60.0 to 69.9 in adult, unspecified obesity type (HCC) Brandy [name] is currently in the action stage of change. As such, her goal is to continue with weight loss efforts. She has agreed to the Category 3 Plan with protein equivalents.   Exercise  goals: As is.  Behavioral modification strategies: increasing lean protein intake, decreasing simple carbohydrates and meal planning and cooking strategies.  Brandy [name] has agreed to follow-up with our clinic in 2 weeks. She was informed of the importance of frequent follow-up visits to maximize her success with intensive lifestyle modifications for her multiple health conditions.   Objective:   Blood pressure 116/75, pulse 95, temperature 98 F (36.7 C), temperature source Oral, height 5\' 6"  (1.676 m), weight (!) 375 lb (170.1 kg), SpO2 96 %. Body mass index is 60.53 kg/m.  General: Cooperative, alert, well developed, in no acute distress. HEENT: Conjunctivae and lids unremarkable. Cardiovascular: Regular rhythm.  Lungs: Normal work of breathing. Neurologic: No focal deficits.   Lab Results  Component Value Date   CREATININE 0.83 08/25/2019   BUN 22 08/25/2019   NA 136 08/25/2019   K 4.8 08/25/2019   CL 100 08/25/2019   CO2 23 08/25/2019   Lab Results  Component Value Date   ALT 12 08/25/2019   AST 12 08/25/2019   ALKPHOS 110 08/25/2019   BILITOT 0.3 08/25/2019   Lab Results  Component Value Date   HGBA1C 7.8 (H) 08/25/2019   HGBA1C 9.2 (A) 06/17/2019   HGBA1C 9.1 (A) 03/18/2019   HGBA1C 8.9 (A) 12/10/2018   HGBA1C 8.5 (H) 08/29/2018   Lab Results  Component Value Date   INSULIN 101.0 (H) 08/25/2019   Lab Results  Component Value Date   TSH 1.720 08/25/2019   Lab Results  Component Value Date   CHOL 162 08/25/2019   HDL 69 08/25/2019   LDLCALC 73 08/25/2019   LDLDIRECT 108.3 09/14/2010   TRIG 114 08/25/2019   CHOLHDL 3 03/18/2019   Lab Results  Component Value Date   WBC 8.0 08/25/2019   HGB 12.7 08/25/2019   HCT 39.4 08/25/2019   MCV 93 08/25/2019   PLT 296 08/25/2019   No results found for: IRON, TIBC, FERRITIN  Attestation Statements:   Reviewed by clinician on day of visit: allergies, medications, problem list, medical history, surgical history,  family history, social history, and previous encounter notes.   I, Trixie Dredge, am acting as transcriptionist for Dennard Nip, MD.  I have reviewed the above documentation for accuracy and completeness, and I agree with the above. -  Dennard Nip, MD

## 2019-09-08 NOTE — Progress Notes (Signed)
  Office: [phone]  /  Fax: [phone]    Date: Sep 22, 2019   Appointment Start Time: 2:30pm Duration: 23 minutes Provider: Lawerance Cruel, Psy.D. Type of Session: Individual Therapy  Location of Patient: Parked in car at home Location of Provider: Provider's Home Type of Contact: Telepsychological Visit via MyChart Video Visit  Session Content: Brandy [name] is a 54 y.o. female presenting via MyChart Video Visit for a follow-up appointment to address the previously established treatment goal of increasing coping skills. Today's appointment was a telepsychological visit due to COVID-19. Brandy [name] provided verbal consent for today's telepsychological appointment and she is aware she is responsible for securing confidentiality on her end of the session. Prior to proceeding with today's appointment, Brandy [name]'s physical location at the time of this appointment was obtained as well a phone number she could be reached at in the event of technical difficulties. Brandy [name] and this provider participated in today's telepsychological service.   This provider conducted a brief check-in. Brandy [name] stated she informed her other therapist about meeting with this provider and also shared her coping card with him. She added, "I really like it [referring to coping card]." Regarding eating, Brandy [name] noted she is "successful," but discussed deviations. Psychoeducation regarding all or nothing thinking was provided. Additionally, psychoeducation regarding triggers for emotional eating was provided. Brandy [name] was provided a handout, and encouraged to utilize the handout between now and the next appointment to increase awareness of triggers and frequency. Brandy [name] agreed. This provider also discussed behavioral strategies for specific triggers, such as placing the utensil down when conversing to avoid mindless eating. Brandy [name] provided verbal consent during today's appointment for this provider to send a handout about triggers via e-mail.  Brandy [name] was receptive to  today's appointment as evidenced by openness to sharing, responsiveness to feedback, and willingness to explore triggers for emotional eating.  Mental Status Examination:  Appearance: well groomed and appropriate hygiene  Behavior: appropriate to circumstances Mood: euthymic Affect: mood congruent Speech: normal in rate, volume, and tone Eye Contact: appropriate Psychomotor Activity: appropriate Gait: unable to assess Thought Process: linear, logical, and goal directed  Thought Content/Perception: denies suicidal and homicidal ideation, plan, and intent and no hallucinations, delusions, bizarre thinking or behavior reported or observed Orientation: time, person, place and purpose of appointment Memory/Concentration: memory, attention, language, and fund of knowledge intact  Insight/Judgment: good   Interventions:  Conducted a brief chart review Conducted a risk assessment Provided empathic reflections and validation Employed supportive psychotherapy interventions to facilitate reduced distress and to improve coping skills with identified stressors Employed motivational interviewing skills to assess patient's willingness/desire to adhere to recommended medical treatments and assignments Psychoeducation provided regarding triggers for emotional eating Psychoeducation provided regarding all-or-nothing thinking  DSM-5 Diagnosis(es): 296.32 (F33.1) Major Depressive Disorder, Recurrent Episode, Moderate and 307.59 (F50.8) Other Specified Feeding or Eating Disorder, Emotional Eating Behaviors   Treatment Goal & Progress: During the initial appointment with this provider, the following treatment goal was established: increase coping skills.Progress is limited, as Brandy [name] has just begun treatment with this provider; however, she is receptive to the interaction and interventions and rapport is being established.   Plan: The next appointment will be scheduled in three weeks, which will be via MyChart  Video Visit. The next session will focus on working towards the established treatment goal.

## 2019-09-09 NOTE — Telephone Encounter (Signed)
Please address

## 2019-09-10 ENCOUNTER — Ambulatory Visit (INDEPENDENT_AMBULATORY_CARE_PROVIDER_SITE_OTHER): Payer: BC Managed Care – PPO | Admitting: Psychology

## 2019-09-10 DIAGNOSIS — F3289 Other specified depressive episodes: Secondary | ICD-10-CM

## 2019-09-14 ENCOUNTER — Encounter: Payer: Self-pay | Admitting: Primary Care

## 2019-09-14 ENCOUNTER — Ambulatory Visit (INDEPENDENT_AMBULATORY_CARE_PROVIDER_SITE_OTHER): Payer: BC Managed Care – PPO | Admitting: Primary Care

## 2019-09-14 ENCOUNTER — Ambulatory Visit (INDEPENDENT_AMBULATORY_CARE_PROVIDER_SITE_OTHER): Payer: BC Managed Care – PPO | Admitting: Psychology

## 2019-09-14 ENCOUNTER — Other Ambulatory Visit: Payer: Self-pay

## 2019-09-14 DIAGNOSIS — N950 Postmenopausal bleeding: Secondary | ICD-10-CM

## 2019-09-14 DIAGNOSIS — R1084 Generalized abdominal pain: Secondary | ICD-10-CM | POA: Diagnosis not present

## 2019-09-14 DIAGNOSIS — R109 Unspecified abdominal pain: Secondary | ICD-10-CM | POA: Insufficient documentation

## 2019-09-14 DIAGNOSIS — F3289 Other specified depressive episodes: Secondary | ICD-10-CM

## 2019-09-14 NOTE — Assessment & Plan Note (Signed)
Following with GYN, she is pending sonohysterogram and potential D&C, needing surgical clearance.  Exam today stable. Labs from late April 2021 reviewed and improved. ECG from late April 2021 without changes from 2020 and no acute changes.   Cleared for surgery, will complete forms from GYN once received.

## 2019-09-14 NOTE — Progress Notes (Signed)
Subjective:    Patient ID: Brandy [name], female    DOB: 12-Aug-1965, 54 y.o.   MRN: [id number]  HPI  This visit occurred during the SARS-CoV-2 public health emergency.  Safety protocols were in place, including screening questions prior to the visit, additional usage of staff PPE, and extensive cleaning of exam room while observing appropriate contact time as indicated for disinfecting solutions.   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   She is to undergo sonohysterogram and potential D&C due to post menopausal bleeding.   She is currently following with [facility] with her last visit being in late April 2021. Lab completed at that visit, also ECG.  She is checking her glucose levels twice daily before breakfast and 2 hours after dinner. Her readings AM fasting are low to mid 100's, 2 hours after dinner mid to high 100's with some low 200's. A1C was 7.8 in late April 2021.  She is currently managed on vitamin D3 50,000 units three times weekly, recent vitamin D level of 31. This was changed recently by Dr. Dalbert Garnet.   She has been experiencing mid thoracic back pain for the last one month. This morning she noticed intermittent pain to her epigastric region with radiation to her left upper and lower abdomen. Her lower back pain feels like her "period" cramping pain, but without menstruation. She's had two bowel movements was today which were normal.    She denies diarrhea, bloody stools, urinary symptoms, vaginal discharge. This morning she did have some nausea with the intense pain, also pain upon palpation to the LLQ. She did eat take out food last night, steak with mushrooms and broccoli. This is different from her normal routine as she never eats take  out food. All symptoms have improved.    Wt Readings from Last 3 Encounters:  09/14/19 (!) 378 lb 8 oz (171.7 kg)  09/08/19 (!) 375 lb (170.1 kg)  08/25/19 (!) 383 lb (173.7 kg)     BP Readings from Last 3 Encounters:  09/14/19 124/80  09/08/19 116/75  08/25/19 124/77     Review of Systems  Constitutional: Negative for fever.  Eyes: Negative for visual disturbance.  Respiratory: Negative for shortness of breath.   Gastrointestinal: Positive for abdominal pain and nausea. Negative for blood in stool, constipation, diarrhea and vomiting.  Genitourinary: Negative for dysuria and frequency.  Musculoskeletal: Positive for arthralgias and back pain.  Neurological: Negative for dizziness.  Hematological: Negative for adenopathy.  Psychiatric/Behavioral: The patient is not nervous/anxious.        Past Medical History:  Diagnosis Date  . Anxiety   . Arthritis   . Back pain   . Brain fog   . CHF (congestive heart failure) (HCC)   . Depression   . Diabetes mellitus without complication (HCC)   . Difficulty walking   . Dysmetabolic syndrome X   . Dyspnea   . Fatigue   . Fibromyalgia   . Hypertension   . Hypertriglyceridemia   . Joint pain   . Leg weakness   . Lower extremity edema   . Muscle atrophy   . Neuropathy   . Obesity   . Osteoarthritis   . Poor memory   . Post-menopausal bleeding   . Prediabetes   . Rosacea   .  Tetanus vaccine causing adverse effect in therapeutic use   . Vitamin D deficiency      Social History   Socioeconomic History  . Marital status: Married    Spouse name: Ed  . Number of children: 3  . Years of education: Not on file  . Highest education level: Not on file  Occupational History  . Occupation: Immunologist: [employer]  Tobacco Use  . Smoking status: Never Smoker  . Smokeless tobacco: Never Used  Substance and Sexual Activity  . Alcohol use: Yes    Comment: rarely - once a year  .  Drug use: No  . Sexual activity: Not on file  Other Topics Concern  . Not on file  Social History Narrative   Best boy      Married; 3 kids (middle child-severely autistic)      No regular exercise   Social Determinants of Health   Financial Resource Strain:   . Difficulty of Paying Living Expenses:   Food Insecurity:   . Worried About Programme researcher, broadcasting/film/video in the Last Year:   . Barista in the Last Year:   Transportation Needs:   . Freight forwarder (Medical):   Marland Kitchen Lack of Transportation (Non-Medical):   Physical Activity:   . Days of Exercise per Week:   . Minutes of Exercise per Session:   Stress:   . Feeling of Stress :   Social Connections:   . Frequency of Communication with Friends and Family:   . Frequency of Social Gatherings with Friends and Family:   . Attends Religious Services:   . Active Member of Clubs or Organizations:   . Attends Banker Meetings:   Marland Kitchen Marital Status:   Intimate Partner Violence:   . Fear of Current or Ex-Partner:   . Emotionally Abused:   Marland Kitchen Physically Abused:   . Sexually Abused:     Past Surgical History:  Procedure Laterality Date  . 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 ENDOMETRIAL BIOPSY  07/14/2019        Family History  Problem Relation Age of Onset  . Rheum arthritis Mother   . COPD Mother   . Stroke Mother   . Depression Mother   . Anxiety disorder Mother   . Eating disorder Mother   . Obesity Mother   . Hypertension Father   . Depression Father   . Alcoholism Father   . Heart disease Maternal Grandmother   . Stroke Maternal Grandmother   . Alcohol abuse Maternal Grandmother   . Alcohol abuse Maternal Grandfather   . Aneurysm Paternal Grandmother   . Autism Son        severe  . Diabetes Paternal Aunt   . Breast cancer Neg Hx     Allergies  Allergen Reactions  . Diphenhydramine Hcl     REACTION: minor rash  . Penicillins     REACTION: reaction as a child    Current  Outpatient Medications on File Prior to Visit  Medication Sig Dispense Refill  . acetaminophen (TYLENOL) 650 MG CR tablet Take 650 mg by mouth every 8 (eight) hours as needed for pain.    . ARIPiprazole (ABILIFY) 5 MG tablet Take 5 mg by mouth daily.    Marland Kitchen aspirin EC 81 MG tablet Take 81 mg by mouth daily.    Marland Kitchen atorvastatin (LIPITOR) 10 MG tablet TAKE 1 TABLET (10 MG  TOTAL) BY MOUTH DAILY. FOR CHOLESTEROL. 90 tablet 0  . celecoxib (CELEBREX) 100 MG capsule Take 1 capsule (100 mg total) by mouth 2 (two) times daily. As needed for pain. 180 capsule 1  . cetirizine (ZYRTEC) 10 MG tablet TAKE 1 TABLET BY MOUTH EVERY DAY 90 tablet 0  . cholecalciferol (VITAMIN D3) 25 MCG (1000 UT) tablet Take 1,000 Units by mouth daily.    . citalopram (CELEXA) 20 MG tablet Take 20 mg by mouth daily.    . Continuous Blood Gluc Receiver (DEXCOM G6 RECEIVER) DEVI Inject 1 Device into the skin daily. 4 each 1  . Continuous Blood Gluc Sensor (DEXCOM G6 SENSOR) MISC Inject 1 Device into the skin daily. 3 each 1  . Continuous Blood Gluc Transmit (DEXCOM G6 TRANSMITTER) MISC Inject 1 Device into the skin daily. 4 each 1  . Cyanocobalamin (VITAMIN B-12 PO) Take 1 tablet by mouth daily.    . DULoxetine (CYMBALTA) 30 MG capsule Take 90 mg by mouth. Take 3 capsules daily     . folic acid (FOLVITE) 1 MG tablet Take 1 mg by mouth daily.    Marland Kitchen gabapentin (NEURONTIN) 300 MG capsule Take 1 capsule (300 mg total) by mouth 3 (three) times daily. For back pain. (Patient taking differently: Take 300 mg by mouth 4 (four) times daily. For back pain.) 270 capsule 3  . glipiZIDE (GLUCOTROL) 10 MG tablet TAKE 1 TABLET (10 MG TOTAL) BY MOUTH 2 (TWO) TIMES DAILY BEFORE A MEAL. FOR DIABETES. 180 tablet 3  . hydrocortisone 2.5 % cream Apply topically 2 (two) times daily.    . Insulin Glargine (BASAGLAR KWIKPEN) 100 UNIT/ML SOPN Inject 0.35 mLs (35 Units total) into the skin at bedtime. 15 mL 2  . Insulin Pen Needle (PEN NEEDLES) 31G X 6 MM MISC Use  nightly with insulin. 100 each 2  . lisinopril-hydrochlorothiazide (ZESTORETIC) 20-12.5 MG tablet TAKE 1 TABLET BY MOUTH DAILY FOR BLOOD PRESSURE 90 tablet 1  . metFORMIN (GLUCOPHAGE-XR) 500 MG 24 hr tablet TAKE 2 TABLETS (1,000 MG TOTAL) BY MOUTH DAILY WITH BREAKFAST. FOR DIABETES. 180 tablet 1  . metroNIDAZOLE (METROCREAM) 0.75 % cream Apply 1 application topically daily.    . Multiple Minerals-Vitamins (CALCIUM-MAGNESIUM-ZINC-D3 PO) Take 1 tablet by mouth daily.    . traZODone (DESYREL) 50 MG tablet Take 50 mg by mouth at bedtime as needed. Taking 1/2 tab daily    . Vitamin D, Ergocalciferol, (DRISDOL) 1.25 MG (50000 UNIT) CAPS capsule Take 1 capsule (50,000 Units total) by mouth every 3 (three) days. 10 capsule 0   No current facility-administered medications on file prior to visit.    BP 124/80   Pulse (!) 110   Temp (!) 96.6 F (35.9 C) (Temporal)   Ht 5\' 6"  (1.676 m)   Wt (!) 378 lb 8 oz (171.7 kg)   LMP  (LMP Unknown)   SpO2 98%   BMI 61.09 kg/m    Objective:   Physical Exam  Constitutional: She is oriented to person, place, and time. She appears well-nourished.  HENT:  Right Ear: Tympanic membrane and ear canal normal.  Left Ear: Tympanic membrane and ear canal normal.  Mouth/Throat: Oropharynx is clear and moist.  Eyes: Pupils are equal, round, and reactive to light. EOM are normal.  Cardiovascular: Normal rate and regular rhythm.  Respiratory: Effort normal and breath sounds normal.  GI: Soft. Bowel sounds are normal. There is no abdominal tenderness.    Slight pressure to LUQ, LLQ and lower mid  abdomen with moderate palpation.   Musculoskeletal:     Cervical back: Neck supple.     Comments: Decrease in ROM to lower back, chronic. Ambulates slowly with caution, chronic.   Neurological: She is alert and oriented to person, place, and time. No cranial nerve deficit.  Reflex Scores:      Patellar reflexes are 2+ on the right side and 2+ on the left side. Skin: Skin  is warm and dry.  Psychiatric: She has a normal mood and affect.           Assessment & Plan:

## 2019-09-14 NOTE — Assessment & Plan Note (Signed)
Epigastric, LUQ, LLQ, lower mid abdomen x 8 hours, improved and nearly resolved after two bowel movements.  HPI and exam without emergent concern. Differentials include constipation, diverticulitis, renal stone. It is more reassuring that symptoms improved after two bowel movements. Perhaps her dinner last night caused some GI upset as she typically doesn't eat take out food.   Will follow up with her tomorrow.

## 2019-09-14 NOTE — Patient Instructions (Signed)
Congratulation on your weight loss, keep up the great work!!  You are cleared for surgery, please ask Dr. Vergie Living to send any paperwork for me to complete for clearance.   We'll talk tomorrow. It was a pleasure to see you today!

## 2019-09-15 ENCOUNTER — Telehealth (INDEPENDENT_AMBULATORY_CARE_PROVIDER_SITE_OTHER): Payer: BC Managed Care – PPO | Admitting: Primary Care

## 2019-09-15 DIAGNOSIS — E559 Vitamin D deficiency, unspecified: Secondary | ICD-10-CM | POA: Diagnosis not present

## 2019-09-15 DIAGNOSIS — G8929 Other chronic pain: Secondary | ICD-10-CM

## 2019-09-15 DIAGNOSIS — G4719 Other hypersomnia: Secondary | ICD-10-CM

## 2019-09-15 DIAGNOSIS — M5442 Lumbago with sciatica, left side: Secondary | ICD-10-CM

## 2019-09-15 DIAGNOSIS — I10 Essential (primary) hypertension: Secondary | ICD-10-CM | POA: Diagnosis not present

## 2019-09-15 DIAGNOSIS — F411 Generalized anxiety disorder: Secondary | ICD-10-CM

## 2019-09-15 DIAGNOSIS — F331 Major depressive disorder, recurrent, moderate: Secondary | ICD-10-CM

## 2019-09-15 DIAGNOSIS — E119 Type 2 diabetes mellitus without complications: Secondary | ICD-10-CM | POA: Diagnosis not present

## 2019-09-15 DIAGNOSIS — E781 Pure hyperglyceridemia: Secondary | ICD-10-CM

## 2019-09-15 DIAGNOSIS — R5382 Chronic fatigue, unspecified: Secondary | ICD-10-CM

## 2019-09-15 NOTE — Assessment & Plan Note (Signed)
Repeat sleep study confirmed sleep apnea. Following with Neurology, to be fitted with CPAP next week.

## 2019-09-15 NOTE — Assessment & Plan Note (Signed)
Overall improved, but continues with symptoms. Following with psychiatry and therapy.  Continue current medications.  

## 2019-09-15 NOTE — Assessment & Plan Note (Signed)
Well controlled, continue lisinopril-HCTZ.  CMP reviewed from late April 2021.

## 2019-09-15 NOTE — Assessment & Plan Note (Signed)
Following with orthopedics, Dr. Ethelene Hal, and to undergo steroid injections soon.

## 2019-09-15 NOTE — Assessment & Plan Note (Signed)
Now managed on Vitamin D 50,000 units three times weekly [facility].   Continue to monitor levels.

## 2019-09-15 NOTE — Assessment & Plan Note (Signed)
A1C improved to 7.8 from 9.2, commended her on this! Continue to follow up with [facility] for weight loss and healthier lifestyle.   We will repeat A1C in 3 months at her next follow up visit.  Managed on statin and ACE. Foot and eye exam UTD. Pneumonia vaccination UTD.  Follow up in 3 months.

## 2019-09-15 NOTE — Assessment & Plan Note (Signed)
Recently tested positive for sleep apnea, to be fitted with CPAP next week. Following with Neurology.

## 2019-09-15 NOTE — Assessment & Plan Note (Signed)
Slight elevation with recent lipid panel, overall improved. Continue atorvastatin.

## 2019-09-15 NOTE — Assessment & Plan Note (Signed)
Overall improved, but continues with symptoms. Following with psychiatry and therapy.  Continue current medications.

## 2019-09-15 NOTE — Progress Notes (Signed)
Subjective:    Patient ID: Brandy [name], female    DOB: [birth year], 54 y.o.   MRN: [id number]  HPI  Virtual Visit via Video Note  I connected with Brandy [name] on 09/15/19 at 11:40 AM EDT by a video enabled telemedicine application and verified that I am speaking with the correct person using two identifiers.  Location: Patient: Home Provider: Office   I discussed the limitations of evaluation and management by telemedicine and the availability of in person appointments. The patient expressed understanding and agreed to proceed.  History of Present Illness:  Brandy [name] is a 54 year old female with a history of hypertension, PAD, CHF, type 2 diabetes, chronic bilateral lower back pain, MDD, chronic fatigue, GAD, post menopausal bleeding who presents today for follow up.  She was last evaluated yesterday for surgical clearance, endorsed epigastric and left upper and lower abdominal pain. She woke up this morning and her symptoms resolved. She denies nausea, constipation.  [facility] is [facility] with [facility] who completed lab work in late April 2021. A1C of 7.8 which is an improvement from 9.2 in February 2021. Her blood sugars are running low to mid 100's in the morning and mid 100's in the afternoon.   BP Readings from Last 3 Encounters:  09/14/19 124/80  09/08/19 116/75  08/25/19 124/77     Wt Readings from Last 3 Encounters:  09/14/19 (!) 378 lb 8 oz (171.7 kg)  09/08/19 (!) 375 lb (170.1 kg)  08/25/19 (!) 383 lb (173.7 kg)    She is following with orthopedics, Dr. Ethelene Hal, plan is to start cortisone injections. She is concerned about having this injection given her diabetes. She is planning on proceeding with the injection but will watch her diet. She is taking gabapentin 2-3 times daily for the most part. She will take the Celebrex twice daily, everyday.  She continues to follow with therapy and psychiatry. Overall she is doing better on Citalopram,  Abilify, Cymbalta, Trazodone but mood will fluctuate. She has noticed more patience and less irritability. She is not taking Trazodone as she doesn't like the way it makes her feel. Overall she is sleeping well without medication.   She recently underwent a repeat sleep study per neurology, Dr. Vickey Huger, was diagnosed with OSA, she will be fitted for a CPAP machine next week.    Observations/Objective:  Alert and oriented. Appears well, not sickly. No distress. Speaking in complete sentences.    Assessment and Plan:  See problem based charting.  Follow Up Instructions:  Follow up with Dr. Ethelene Hal and Dr. Vickey Huger as scheduled.  Continue to work on weight loss through a healthy diet and exercise.  Continue to monitor your blood sugars.   It was a pleasure to see you today!    I discussed the assessment and treatment plan with the patient. The patient was provided an opportunity to ask questions and all were answered. The patient agreed with the plan and demonstrated an understanding of the instructions.   The patient was advised to call back or seek an in-person evaluation if the symptoms worsen or if the condition fails to improve as anticipated.    Doreene Nest, NP    Review of Systems  Eyes: Negative for visual disturbance.  Respiratory:       No changes in shortness of breath   Cardiovascular: Negative for chest pain.  Gastrointestinal: Negative for abdominal pain and nausea.  Genitourinary: Negative for vaginal bleeding.  Neurological:  Negative for dizziness and headaches.  Psychiatric/Behavioral:       See HPI       Past Medical History:  Diagnosis Date  . Anxiety   . Arthritis   . Back pain   . Brain fog   . CHF (congestive heart failure) (HCC)   . Depression   . Diabetes mellitus without complication (HCC)   . Difficulty walking   . Dysmetabolic syndrome X   . Dyspnea   . Fatigue   . Fibromyalgia   . Hypertension   . Hypertriglyceridemia   .  Joint pain   . Leg weakness   . Lower extremity edema   . Muscle atrophy   . Neuropathy   . Obesity   . Osteoarthritis   . Poor memory   . Post-menopausal bleeding   . Prediabetes   . Rosacea   . Tetanus vaccine causing adverse effect in therapeutic use   . Vitamin D deficiency      Social History   Socioeconomic History  . Marital status: Married    Spouse name: Ed  . Number of children: 3  . Years of education: Not on file  . Highest education level: Not on file  Occupational History  . Occupation: Immunologist: [employer]  Tobacco Use  . Smoking status: Never Smoker  . Smokeless tobacco: Never Used  Substance and Sexual Activity  . Alcohol use: Yes    Comment: rarely - once a year  . Drug use: No  . Sexual activity: Not on file  Other Topics Concern  . Not on file  Social History Narrative   Best boy      Married; 3 kids (middle child-severely autistic)      No regular exercise   Social Determinants of Health   Financial Resource Strain:   . Difficulty of Paying Living Expenses:   Food Insecurity:   . Worried About Programme researcher, broadcasting/film/video in the Last Year:   . Barista in the Last Year:   Transportation Needs:   . Freight forwarder (Medical):   Marland Kitchen Lack of Transportation (Non-Medical):   Physical Activity:   . Days of Exercise per Week:   . Minutes of Exercise per Session:   Stress:   . Feeling of Stress :   Social Connections:   . Frequency of Communication with Friends and Family:   . Frequency of Social Gatherings with Friends and Family:   . Attends Religious Services:   . Active Member of Clubs or Organizations:   . Attends Banker Meetings:   Marland Kitchen Marital Status:   Intimate Partner Violence:   . Fear of Current or Ex-Partner:   . Emotionally Abused:   Marland Kitchen Physically Abused:   . Sexually Abused:     Past Surgical History:  Procedure Laterality Date  . BONE TUMOR EXCISION   1975   Right leg  . ENDOMETRIAL BIOPSY  07/14/2019        Family History  Problem Relation Age of Onset  . Rheum arthritis Mother   . COPD Mother   . Stroke Mother   . Depression Mother   . Anxiety disorder Mother   . Eating disorder Mother   . Obesity Mother   . Hypertension Father   . Depression Father   . Alcoholism Father   . Heart disease Maternal Grandmother   . Stroke Maternal Grandmother   . Alcohol abuse Maternal Grandmother   .  Alcohol abuse Maternal Grandfather   . Aneurysm Paternal Grandmother   . Autism Son        severe  . Diabetes Paternal Aunt   . Breast cancer Neg Hx     Allergies  Allergen Reactions  . Diphenhydramine Hcl     REACTION: minor rash  . Penicillins     REACTION: reaction as a child    Current Outpatient Medications on File Prior to Visit  Medication Sig Dispense Refill  . acetaminophen (TYLENOL) 650 MG CR tablet Take 650 mg by mouth every 8 (eight) hours as needed for pain.    . ARIPiprazole (ABILIFY) 5 MG tablet Take 5 mg by mouth daily.    Marland Kitchen aspirin EC 81 MG tablet Take 81 mg by mouth daily.    Marland Kitchen atorvastatin (LIPITOR) 10 MG tablet TAKE 1 TABLET (10 MG TOTAL) BY MOUTH DAILY. FOR CHOLESTEROL. 90 tablet 0  . celecoxib (CELEBREX) 100 MG capsule Take 1 capsule (100 mg total) by mouth 2 (two) times daily. As needed for pain. 180 capsule 1  . cetirizine (ZYRTEC) 10 MG tablet TAKE 1 TABLET BY MOUTH EVERY DAY 90 tablet 0  . cholecalciferol (VITAMIN D3) 25 MCG (1000 UT) tablet Take 1,000 Units by mouth daily.    . citalopram (CELEXA) 20 MG tablet Take 20 mg by mouth daily.    . Continuous Blood Gluc Receiver (DEXCOM G6 RECEIVER) DEVI Inject 1 Device into the skin daily. 4 each 1  . Continuous Blood Gluc Sensor (DEXCOM G6 SENSOR) MISC Inject 1 Device into the skin daily. 3 each 1  . Continuous Blood Gluc Transmit (DEXCOM G6 TRANSMITTER) MISC Inject 1 Device into the skin daily. 4 each 1  . Cyanocobalamin (VITAMIN B-12 PO) Take 1 tablet by mouth  daily.    . DULoxetine (CYMBALTA) 30 MG capsule Take 90 mg by mouth. Take 3 capsules daily     . folic acid (FOLVITE) 1 MG tablet Take 1 mg by mouth daily.    Marland Kitchen gabapentin (NEURONTIN) 300 MG capsule Take 1 capsule (300 mg total) by mouth 3 (three) times daily. For back pain. (Patient taking differently: Take 300 mg by mouth 4 (four) times daily. For back pain.) 270 capsule 3  . glipiZIDE (GLUCOTROL) 10 MG tablet TAKE 1 TABLET (10 MG TOTAL) BY MOUTH 2 (TWO) TIMES DAILY BEFORE A MEAL. FOR DIABETES. 180 tablet 3  . hydrocortisone 2.5 % cream Apply topically 2 (two) times daily.    . Insulin Glargine (BASAGLAR KWIKPEN) 100 UNIT/ML SOPN Inject 0.35 mLs (35 Units total) into the skin at bedtime. 15 mL 2  . Insulin Pen Needle (PEN NEEDLES) 31G X 6 MM MISC Use nightly with insulin. 100 each 2  . lisinopril-hydrochlorothiazide (ZESTORETIC) 20-12.5 MG tablet TAKE 1 TABLET BY MOUTH DAILY FOR BLOOD PRESSURE 90 tablet 1  . metFORMIN (GLUCOPHAGE-XR) 500 MG 24 hr tablet TAKE 2 TABLETS (1,000 MG TOTAL) BY MOUTH DAILY WITH BREAKFAST. FOR DIABETES. 180 tablet 1  . metroNIDAZOLE (METROCREAM) 0.75 % cream Apply 1 application topically daily.    . Multiple Minerals-Vitamins (CALCIUM-MAGNESIUM-ZINC-D3 PO) Take 1 tablet by mouth daily.    . traZODone (DESYREL) 50 MG tablet Take 50 mg by mouth at bedtime as needed. Taking 1/2 tab daily    . Vitamin D, Ergocalciferol, (DRISDOL) 1.25 MG (50000 UNIT) CAPS capsule Take 1 capsule (50,000 Units total) by mouth every 3 (three) days. 10 capsule 0   No current facility-administered medications on file prior to visit.  LMP  (LMP Unknown)    Objective:   Physical Exam  Constitutional: She is oriented to person, place, and time. She appears well-nourished.  Respiratory: Effort normal.  Neurological: She is alert and oriented to person, place, and time.  Psychiatric: She has a normal mood and affect.           Assessment & Plan:

## 2019-09-16 ENCOUNTER — Encounter: Payer: Self-pay | Admitting: *Deleted

## 2019-09-16 DIAGNOSIS — I5032 Chronic diastolic (congestive) heart failure: Secondary | ICD-10-CM

## 2019-09-16 DIAGNOSIS — R0609 Other forms of dyspnea: Secondary | ICD-10-CM

## 2019-09-16 DIAGNOSIS — R0600 Dyspnea, unspecified: Secondary | ICD-10-CM

## 2019-09-16 NOTE — Progress Notes (Signed)
Pulmonary Individual Treatment Plan  Patient Details  Name: Brandy [name] MRN: [id number] Date of Birth: 1966-01-07 Referring Provider:     Pulmonary Rehab from 04/06/2019 in [facility] Cardiac and Pulmonary Rehab  Referring Provider  Gollan      Initial Encounter Date:    Pulmonary Rehab from 04/06/2019 in [facility] Cardiac and Pulmonary Rehab  Date  04/06/19      Visit Diagnosis: Heart failure, diastolic, chronic (HCC)  Dyspnea on exertion  Patient's Home Medications on Admission:  Current Outpatient Medications:  .  acetaminophen (TYLENOL) 650 MG CR tablet, Take 650 mg by mouth every 8 (eight) hours as needed for pain., Disp: , Rfl:  .  ARIPiprazole (ABILIFY) 5 MG tablet, Take 5 mg by mouth daily., Disp: , Rfl:  .  aspirin EC 81 MG tablet, Take 81 mg by mouth daily., Disp: , Rfl:  .  atorvastatin (LIPITOR) 10 MG tablet, TAKE 1 TABLET (10 MG TOTAL) BY MOUTH DAILY. FOR CHOLESTEROL., Disp: 90 tablet, Rfl: 0 .  celecoxib (CELEBREX) 100 MG capsule, Take 1 capsule (100 mg total) by mouth 2 (two) times daily. As needed for pain., Disp: 180 capsule, Rfl: 1 .  cetirizine (ZYRTEC) 10 MG tablet, TAKE 1 TABLET BY MOUTH EVERY DAY, Disp: 90 tablet, Rfl: 0 .  cholecalciferol (VITAMIN D3) 25 MCG (1000 UT) tablet, Take 1,000 Units by mouth daily., Disp: , Rfl:  .  citalopram (CELEXA) 20 MG tablet, Take 20 mg by mouth daily., Disp: , Rfl:  .  Continuous Blood Gluc Receiver (DEXCOM G6 RECEIVER) DEVI, Inject 1 Device into the skin daily., Disp: 4 each, Rfl: 1 .  Continuous Blood Gluc Sensor (DEXCOM G6 SENSOR) MISC, Inject 1 Device into the skin daily., Disp: 3 each, Rfl: 1 .  Continuous Blood Gluc Transmit (DEXCOM G6 TRANSMITTER) MISC, Inject 1 Device into the skin daily., Disp: 4 each, Rfl: 1 .  Cyanocobalamin (VITAMIN B-12 PO), Take 1 tablet by mouth daily., Disp: , Rfl:  .  DULoxetine (CYMBALTA) 30 MG capsule, Take 90 mg by mouth. Take 3 capsules daily , Disp: , Rfl:  .  folic acid (FOLVITE) 1 MG tablet,  Take 1 mg by mouth daily., Disp: , Rfl:  .  gabapentin (NEURONTIN) 300 MG capsule, Take 1 capsule (300 mg total) by mouth 3 (three) times daily. For back pain. (Patient taking differently: Take 300 mg by mouth 4 (four) times daily. For back pain.), Disp: 270 capsule, Rfl: 3 .  glipiZIDE (GLUCOTROL) 10 MG tablet, TAKE 1 TABLET (10 MG TOTAL) BY MOUTH 2 (TWO) TIMES DAILY BEFORE A MEAL. FOR DIABETES., Disp: 180 tablet, Rfl: 3 .  hydrocortisone 2.5 % cream, Apply topically 2 (two) times daily., Disp: , Rfl:  .  Insulin Glargine (BASAGLAR KWIKPEN) 100 UNIT/ML SOPN, Inject 0.35 mLs (35 Units total) into the skin at bedtime., Disp: 15 mL, Rfl: 2 .  Insulin Pen Needle (PEN NEEDLES) 31G X 6 MM MISC, Use nightly with insulin., Disp: 100 each, Rfl: 2 .  lisinopril-hydrochlorothiazide (ZESTORETIC) 20-12.5 MG tablet, TAKE 1 TABLET BY MOUTH DAILY FOR BLOOD PRESSURE, Disp: 90 tablet, Rfl: 1 .  metFORMIN (GLUCOPHAGE-XR) 500 MG 24 hr tablet, TAKE 2 TABLETS (1,000 MG TOTAL) BY MOUTH DAILY WITH BREAKFAST. FOR DIABETES., Disp: 180 tablet, Rfl: 1 .  metroNIDAZOLE (METROCREAM) 0.75 % cream, Apply 1 application topically daily., Disp: , Rfl:  .  Multiple Minerals-Vitamins (CALCIUM-MAGNESIUM-ZINC-D3 PO), Take 1 tablet by mouth daily., Disp: , Rfl:  .  traZODone (DESYREL) 50 MG tablet, Take  50 mg by mouth at bedtime as needed. Taking 1/2 tab daily, Disp: , Rfl:  .  Vitamin D, Ergocalciferol, (DRISDOL) 1.25 MG (50000 UNIT) CAPS capsule, Take 1 capsule (50,000 Units total) by mouth every 3 (three) days., Disp: 10 capsule, Rfl: 0  Past Medical History: Past Medical History:  Diagnosis Date  . Anxiety   . Arthritis   . Back pain   . Brain fog   . CHF (congestive heart failure) ([place])   . Depression   . Diabetes mellitus without complication ([place])   . Difficulty walking   . Dysmetabolic syndrome X   . Dyspnea   . Fatigue   . Fibromyalgia   . Hypertension   . Hypertriglyceridemia   . Joint pain   . Leg weakness   .  Lower extremity edema   . Muscle atrophy   . Neuropathy   . Obesity   . Osteoarthritis   . Poor memory   . Post-menopausal bleeding   . Prediabetes   . Rosacea   . Tetanus vaccine causing adverse effect in therapeutic use   . Vitamin D deficiency     Tobacco Use: Social History   Tobacco Use  Smoking Status Never Smoker  Smokeless Tobacco Never Used    Labs: Recent Review Flowsheet Data    Labs for ITP Cardiac and Pulmonary Rehab Latest Ref Rng & Units 08/29/2018 12/10/2018 03/18/2019 06/17/2019 08/25/2019   Cholestrol 100 - 199 mg/dL - - 160 - 162   LDLCALC 0 - 99 mg/dL - - 71 - 73   LDLDIRECT mg/dL - - - - -   HDL >39 mg/dL - - 56.70 - 69   Trlycerides 0 - 149 mg/dL - - 159.0(H) - 114   Hemoglobin A1c 4.8 - 5.6 % 8.5(H) 8.9(A) 9.1(A) 9.2(A) 7.8(H)   PHART 7.350 - 7.450 - - - - -   PCO2ART 32.0 - 48.0 mmHg - - - - -   HCO3 20.0 - 28.0 mmol/L - - - - -   ACIDBASEDEF 0.0 - 2.0 mmol/L - - - - -   O2SAT % - - - - -       Pulmonary Assessment Scores: Pulmonary Assessment Scores    Row Name 04/06/19 1704         ADL UCSD   SOB Score total  83     Rest  0     Walk  4     Stairs  5     Bath  4     Dress  1     Shop  5       CAT Score   CAT Score  18       mMRC Score   mMRC Score  3        UCSD: Self-administered rating of dyspnea associated with activities of daily living (ADLs) 6-point scale (0 = "not at all" to 5 = "maximal or unable to do because of breathlessness")  Scoring Scores range from 0 to 120.  Minimally important difference is 5 units  CAT: CAT can identify the health impairment of COPD patients and is better correlated with disease progression.  CAT has a scoring range of zero to 40. The CAT score is classified into four groups of low (less than 10), medium (10 - 20), high (21-30) and very high (31-40) based on the impact level of disease on health status. A CAT score over 10 suggests significant symptoms.  A worsening CAT score could be  explained  by an exacerbation, poor medication adherence, poor inhaler technique, or progression of COPD or comorbid conditions.  CAT MCID is 2 points  mMRC: mMRC (Modified Medical Research Council) Dyspnea Scale is used to assess the degree of baseline functional disability in patients of respiratory disease due to dyspnea. No minimal important difference is established. A decrease in score of 1 point or greater is considered a positive change.   Pulmonary Function Assessment:   Exercise Target Goals: Exercise Program Goal: Individual exercise prescription set using results from initial 6 min walk test and THRR while considering  patient's activity barriers and safety.   Exercise Prescription Goal: Initial exercise prescription builds to 30-45 minutes a day of aerobic activity, 2-3 days per week.  Home exercise guidelines will be given to patient during program as part of exercise prescription that the participant will acknowledge.  Education: Aerobic Exercise & Resistance Training: - Gives group verbal and written instruction on the various components of exercise. Focuses on aerobic and resistive training programs and the benefits of this training and how to safely progress through these programs..   Education: Exercise & Equipment Safety: - Individual verbal instruction and demonstration of equipment use and safety with use of the equipment.   Pulmonary Rehab from 04/06/2019 in [name] Cardiac and Pulmonary Rehab  Date  04/06/19  Educator  AS  Instruction Review Code  1- Verbalizes Understanding      Education: Exercise Physiology & General Exercise Guidelines: - Group verbal and written instruction with models to review the exercise physiology of the cardiovascular system and associated critical values. Provides general exercise guidelines with specific guidelines to those with heart or lung disease.    Education: Flexibility, Balance, Mind/Body Relaxation: Provides group verbal/written  instruction on the benefits of flexibility and balance training, including mind/body exercise modes such as yoga, pilates and tai chi.  Demonstration and skill practice provided.   Activity Barriers & Risk Stratification: Activity Barriers & Cardiac Risk Stratification - 04/01/19 1413      Activity Barriers & Cardiac Risk Stratification   Activity Barriers  Arthritis;Deconditioning;Muscular Weakness;Shortness of Breath;Fibromyalgia    Comments  chronic fatigue, neuropathy.    Cardiac Risk Stratification  High       6 Minute Walk: 6 Minute Walk    Row Name 04/06/19 1659 06/29/19 1140       6 Minute Walk   Phase  Initial  --    Distance  475 feet  500 feet    Walk Time  4 minutes  4.5 minutes    # of Rest Breaks  2  2    MPH  1.34  1.26    METS  1  1    RPE  14  17    Perceived Dyspnea   3  3    VO2 Peak  3.4  3.3    Symptoms  Yes (comment)  Yes (comment)    Comments  back and leg pain  shortness of breath/ back and leg pain    Resting HR  99 bpm  92 bpm    Resting BP  154/98  112/70    Resting Oxygen Saturation   96 %  96 %    Exercise Oxygen Saturation  during 6 min walk  95 %  95 %    Max Ex. HR  145 bpm  135 bpm    Max Ex. BP  168/102  146/88      Oxygen Initial Assessment: Oxygen Initial Assessment -  04/01/19 1412      Home Oxygen   Home Oxygen Device  None    Sleep Oxygen Prescription  None    Home Exercise Oxygen Prescription  None    Home at Rest Exercise Oxygen Prescription  None      Initial 6 min Walk   Oxygen Used  None      Program Oxygen Prescription   Program Oxygen Prescription  None      Intervention   Short Term Goals  To learn and understand importance of monitoring SPO2 with pulse oximeter and demonstrate accurate use of the pulse oximeter.;To learn and understand importance of maintaining oxygen saturations>88%;To learn and demonstrate proper pursed lip breathing techniques or other breathing techniques.    Long  Term Goals  Verbalizes  importance of monitoring SPO2 with pulse oximeter and return demonstration;Maintenance of O2 saturations>88%;Exhibits proper breathing techniques, such as pursed lip breathing or other method taught during program session       Oxygen Re-Evaluation: Oxygen Re-Evaluation    Row Name 06/29/19 0951 08/12/19 1153           Program Oxygen Prescription   Program Oxygen Prescription  None  None        Home Oxygen   Home Oxygen Device  None  None      Sleep Oxygen Prescription  None  None      Home Exercise Oxygen Prescription  None  None      Home at Rest Exercise Oxygen Prescription  --  None      Compliance with Home Oxygen Use  Yes  --        Goals/Expected Outcomes   Short Term Goals  To learn and demonstrate proper pursed lip breathing techniques or other breathing techniques.  To learn and demonstrate proper pursed lip breathing techniques or other breathing techniques.;To learn and understand importance of maintaining oxygen saturations>88%;To learn and understand importance of monitoring SPO2 with pulse oximeter and demonstrate accurate use of the pulse oximeter.      Long  Term Goals  Exhibits proper breathing techniques, such as pursed lip breathing or other method taught during program session  Exhibits proper breathing techniques, such as pursed lip breathing or other method taught during program session;Maintenance of O2 saturations>88%;Verbalizes importance of monitoring SPO2 with pulse oximeter and return demonstration      Comments  Reviewed PLB technique with pt.  Talked about how it works and it's importance in maintaining their exercise saturations.  Brandy [name] is getting better with her PLB and her breathing overall is getting better.  Less at rest now!!  She has to remind herself to use the PLB.  Sent her videos to review about why it is important and how it works.  Her neurologist has also told her to work on it to help more with off gassing her CO2 stores.      Goals/Expected  Outcomes  Short: Become more profiecient at using PLB.   Long: Become independent at using PLB.  Short: More PLB  Long: Improve SOB overall         Oxygen Discharge (Final Oxygen Re-Evaluation): Oxygen Re-Evaluation - 08/12/19 1153      Program Oxygen Prescription   Program Oxygen Prescription  None      Home Oxygen   Home Oxygen Device  None    Sleep Oxygen Prescription  None    Home Exercise Oxygen Prescription  None    Home at Rest Exercise Oxygen Prescription  None  Goals/Expected Outcomes   Short Term Goals  To learn and demonstrate proper pursed lip breathing techniques or other breathing techniques.;To learn and understand importance of maintaining oxygen saturations>88%;To learn and understand importance of monitoring SPO2 with pulse oximeter and demonstrate accurate use of the pulse oximeter.    Long  Term Goals  Exhibits proper breathing techniques, such as pursed lip breathing or other method taught during program session;Maintenance of O2 saturations>88%;Verbalizes importance of monitoring SPO2 with pulse oximeter and return demonstration    Comments  Brandy [name] is getting better with her PLB and her breathing overall is getting better.  Less at rest now!!  She has to remind herself to use the PLB.  Sent her videos to review about why it is important and how it works.  Her neurologist has also told her to work on it to help more with off gassing her CO2 stores.    Goals/Expected Outcomes  Short: More PLB  Long: Improve SOB overall       Initial Exercise Prescription: Initial Exercise Prescription - 04/06/19 1700      Date of Initial Exercise RX and Referring Provider   Date  04/06/19    Referring Provider  Gollan      Treadmill   MPH  1    Grade  0    Minutes  15   rest as needed   METs  1      NuStep   Level  1    SPM  80    Minutes  15    METs  1      Arm Ergometer   Level  1    RPM  25    Minutes  15    METs  1      Biostep-RELP   Level  1    SPM  50     Minutes  15      Prescription Details   Frequency (times per week)  3    Duration  Progress to 30 minutes of continuous aerobic without signs/symptoms of physical distress      Intensity   THRR 40-80% of Max Heartrate  121-150    Ratings of Perceived Exertion  11-15    Perceived Dyspnea  0-4      Resistance Training   Training Prescription  Yes    Weight  3 lb    Reps  10-15       Perform Capillary Blood Glucose checks as needed.  Exercise Prescription Changes: Exercise Prescription Changes    Row Name 04/06/19 1700 05/06/19 1100 07/02/19 1100 07/15/19 1400 07/29/19 1500     Response to Exercise   Blood Pressure (Admit)  154/98  --  118/70  142/74  160/100   Blood Pressure (Exercise)  168/102  --  --  142/84  134/70   Blood Pressure (Exit)  132/84  --  124/68  140/82  124/84   Heart Rate (Admit)  99 bpm  --  93 bpm  83 bpm  88 bpm   Heart Rate (Exercise)  145 bpm  --  120 bpm  106 bpm  105 bpm   Heart Rate (Exit)  104 bpm  --  98 bpm  99 bpm  97 bpm   Oxygen Saturation (Admit)  96 %  --  96 %  98 %  97 %   Oxygen Saturation (Exercise)  95 %  --  96 %  98 %  97 %   Oxygen Saturation (Exit)  96 %  --  96 %  98 %  98 %   Rating of Perceived Exertion (Exercise)  14  --  _0 Perceived Dyspnea (Exercise)  3  --  _1 Symptoms  leg and back pain  --  --  leg and back pain  leg and back pain   Comments  --  Reviewed Virtual Program  --  --  --   Duration  --  Progress to 10 minutes continuous walking  at current work load and total walking time to 30-45 min  Progress to 30 minutes of  aerobic without signs/symptoms of physical distress  Progress to 30 minutes of  aerobic without signs/symptoms of physical distress  Continue with 30 min of aerobic exercise without signs/symptoms of physical distress.   Intensity  --  THRR unchanged  THRR unchanged  THRR unchanged  THRR unchanged     Progression   Progression  --  Continue to progress workloads to maintain intensity  without signs/symptoms of physical distress.  Continue to progress workloads to maintain intensity without signs/symptoms of physical distress.  Continue to progress workloads to maintain intensity without signs/symptoms of physical distress.  Continue to progress workloads to maintain intensity without signs/symptoms of physical distress.   Average METs  --  --  --  1.73  1.2     Resistance Training   Training Prescription  --  Yes  Yes  Yes  Yes   Weight  --  3 lb, ROM, body weight, bands  3 lb  3 lb  3 lb   Reps  --  10-15  10-15  10-15  10-15     Interval Training   Interval Training  --  No  No  No  No     Treadmill   MPH  --  --  0.6  0.7  0.6   Grade  --  --  0  0  0   Minutes  --  --  _2 METs  --  --  1  1.5  1.4     NuStep   Level  --  --  --  1  3   Minutes  --  --  --  15  15   METs  --  --  --  2  1.3     Recumbant Elliptical   Level  --  --  --  1  1   Minutes  --  --  --  15  15   METs  --  --  --  1.4  1.1     Biostep-RELP   Level  --  --  --  1  1   Minutes  --  --  --  15  15   METs  --  --  --  2  1     Track   Laps  --  --  --  --  --   Minutes  --  10 at home along with videos  --  --  --     Home Exercise Plan   Plans to continue exercise at  --  Home (comment) walking, videos  --  Home (comment) walking, videos  Home (comment) walking, videos   Frequency  --  Add 3 additional days to program exercise sessions.  --  Add 3 additional days to program exercise sessions.  Add 3  additional days to program exercise sessions.   Initial Home Exercises Provided  --  05/06/19  --  05/06/19  05/06/19   Row Name 08/14/19 0900 08/25/19 1600 09/08/19 1600         Response to Exercise   Blood Pressure (Admit)  154/92  144/72  146/64     Blood Pressure (Exercise)  164/82  126/64  126/82     Blood Pressure (Exit)  148/78  122/64  140/60     Heart Rate (Admit)  102 bpm  60 bpm  91 bpm     Heart Rate (Exercise)  110 bpm  93 bpm  92 bpm     Heart Rate  (Exit)  83 bpm  85 bpm  91 bpm     Oxygen Saturation (Admit)  100 %  98 %  96 %     Oxygen Saturation (Exercise)  95 %  97 %  96 %     Oxygen Saturation (Exit)  95 %  98 %  91 %     Rating of Perceived Exertion (Exercise)  _0 Perceived Dyspnea (Exercise)  _1 Symptoms  leg and back pain  leg and back pain  leg and back pain     Duration  Continue with 30 min of aerobic exercise without signs/symptoms of physical distress.  Continue with 30 min of aerobic exercise without signs/symptoms of physical distress.  Continue with 30 min of aerobic exercise without signs/symptoms of physical distress.     Intensity  THRR unchanged  THRR unchanged  THRR unchanged       Progression   Progression  Continue to progress workloads to maintain intensity without signs/symptoms of physical distress.  Continue to progress workloads to maintain intensity without signs/symptoms of physical distress.  Continue to progress workloads to maintain intensity without signs/symptoms of physical distress.     Average METs  1.55  1.5  1.45       Resistance Training   Training Prescription  Yes  Yes  Yes     Weight  3 lb  3 lb  3 lb     Reps  10-15  10-15  10-15       Interval Training   Interval Training  No  No  No       Treadmill   MPH  0.6  0.7  --     Grade  --  0  --     Minutes  2  --  --     METs  1.4  1.54  --       NuStep   Level  --  3  --     SPM  --  80  --     Minutes  --  15  --     METs  --  1.5  --       T5 Nustep   Level  3  --  3     Minutes  15  --  30     METs  1.7  --  1.7       Home Exercise Plan   Plans to continue exercise at  Home (comment) walking, videos  --  Home (comment) walking, videos     Frequency  Add 3 additional days to program exercise sessions.  --  Add 3 additional days to program exercise sessions.     Initial  Home Exercises Provided  05/06/19  --  05/06/19        Exercise Comments: Exercise Comments    Row Name 06/29/19 [phone]            Exercise Comments  First full day of exercise!  Patient was oriented to gym and equipment including functions, settings, policies, and procedures.  Patient's individual exercise prescription and treatment plan were reviewed.  All starting workloads were established based on the results of the 6 minute walk test done at initial orientation visit.  The plan for exercise progression was also introduced and progression will be customized based on patient's performance and goals.          Exercise Goals and Review: Exercise Goals    Row Name 04/06/19 1715             Exercise Goals   Increase Physical Activity  Yes       Intervention  Provide advice, education, support and counseling about physical activity/exercise needs.;Develop an individualized exercise prescription for aerobic and resistive training based on initial evaluation findings, risk stratification, comorbidities and participant's personal goals.       Expected Outcomes  Short Term: Attend rehab on a regular basis to increase amount of physical activity.;Long Term: Add in home exercise to make exercise part of routine and to increase amount of physical activity.;Long Term: Exercising regularly at least 3-5 days a week.       Increase Strength and Stamina  Yes       Intervention  Provide advice, education, support and counseling about physical activity/exercise needs.;Develop an individualized exercise prescription for aerobic and resistive training based on initial evaluation findings, risk stratification, comorbidities and participant's personal goals.       Expected Outcomes  Short Term: Increase workloads from initial exercise prescription for resistance, speed, and METs.;Short Term: Perform resistance training exercises routinely during rehab and add in resistance training at home;Long Term: Improve cardiorespiratory fitness, muscular endurance and strength as measured by increased METs and functional capacity (6MWT)       Able to  understand and use rate of perceived exertion (RPE) scale  Yes       Expected Outcomes  Short Term: Able to use RPE daily in rehab to express subjective intensity level;Long Term:  Able to use RPE to guide intensity level when exercising independently       Able to understand and use Dyspnea scale  Yes       Intervention  Provide education and explanation on how to use Dyspnea scale       Expected Outcomes  Short Term: Able to use Dyspnea scale daily in rehab to express subjective sense of shortness of breath during exertion;Long Term: Able to use Dyspnea scale to guide intensity level when exercising independently       Knowledge and understanding of Target Heart Rate Range (THRR)  Yes       Intervention  Provide education and explanation of THRR including how the numbers were predicted and where they are located for reference       Expected Outcomes  Short Term: Able to state/look up THRR;Short Term: Able to use daily as guideline for intensity in rehab;Long Term: Able to use THRR to govern intensity when exercising independently       Able to check pulse independently  Yes       Intervention  Provide education and demonstration on how to check pulse in carotid and radial arteries.;Review the importance of being  able to check your own pulse for safety during independent exercise       Expected Outcomes  Short Term: Able to explain why pulse checking is important during independent exercise;Long Term: Able to check pulse independently and accurately       Understanding of Exercise Prescription  Yes       Intervention  Provide education, explanation, and written materials on patient's individual exercise prescription       Expected Outcomes  Short Term: Able to explain program exercise prescription;Long Term: Able to explain home exercise prescription to exercise independently          Exercise Goals Re-Evaluation : Exercise Goals Re-Evaluation    Row Name 04/22/19 1247 05/06/19 1151 05/27/19 1205  06/24/19 1146 06/29/19 0948     Exercise Goal Re-Evaluation   Exercise Goals Review  --  Increase Physical Activity;Increase Strength and Stamina;Able to understand and use rate of perceived exertion (RPE) scale;Able to understand and use Dyspnea scale;Knowledge and understanding of Target Heart Rate Range (THRR);Able to check pulse independently;Understanding of Exercise Prescription  Increase Physical Activity;Increase Strength and Stamina;Understanding of Exercise Prescription  Increase Physical Activity;Increase Strength and Stamina;Understanding of Exercise Prescription  Able to understand and use rate of perceived exertion (RPE) scale;Able to understand and use Dyspnea scale;Knowledge and understanding of Target Heart Rate Range (THRR);Understanding of Exercise Prescription   Comments  Not starting until January  Reviewed home exercise with pt today.  Pt plans to walk and use staff videos at home for exercise.  Reviewed THR, pulse, RPE, sign and symptoms, and when to call 911 or MD.  Also discussed weather considerations and indoor options.  Pt voiced understanding.  Brandy [name] is doing okay at home.  She had a lot of questions about her exercise. She was still not sure how to start going.  We talked about downloading [employer] to help prompt her to exercise her three days a week and to record her vitals.  Resent SMS for app. She is going to start with walking and/or staff videos.  We reviewed the order for videos (warm up, weights, cardio, stretch/cool down) and walking.  She feels more confident with how to get started now.  She is doing well with her exercise and has a renewed sense of motivation to get in shape and get moving again. She has a new commitment to exercise and her overall wellbeing.  Reviewed RPE scale, THR and program prescription with pt today.  Pt voiced understanding and was given a copy of goals to take home.   Expected Outcomes  --  Short: Start to exercise at home for 10 min  Long:  Work stamina up to 30  min of exercise.  Short: Start to exercise at home 10 min 3x week.  Long: Build up strength and stamina.  Short: Return to in-person rehab regularly.  Long: Continue to build up stamina.  Short: Use RPE daily to regulate intensity. Long: Follow program prescription in THR.   [place] Name 07/02/19 1146 07/15/19 1415 07/29/19 1540 08/12/19 1151 08/25/19 1611     Exercise Goal Re-Evaluation   Exercise Goals Review  Increase Physical Activity;Increase Strength and Stamina;Able to understand and use rate of perceived exertion (RPE) scale;Able to understand and use Dyspnea scale;Knowledge and understanding of Target Heart Rate Range (THRR);Able to check pulse independently;Understanding of Exercise Prescription  Increase Physical Activity;Increase Strength and Stamina;Understanding of Exercise Prescription  Increase Physical Activity;Increase Strength and Stamina;Understanding of Exercise Prescription  Increase Physical Activity;Increase Strength and Stamina;Understanding  of Exercise Prescription  Increase Strength and Stamina;Increase Physical Activity;Able to understand and use rate of perceived exertion (RPE) scale;Able to understand and use Dyspnea scale;Knowledge and understanding of Target Heart Rate Range (THRR);Able to check pulse independently;Understanding of Exercise Prescription   Comments  Brandy [name] is tolerating exercise well so far.  Staff will monitor progress.  Brandy [name] is doing well in rehab.  She is enjoying her classmates and starting to like her exercise.  She is up to 2 METs on the BioStep and NuStep.  We will continue to monitor her progress.  Brandy [name] has been doing well in rehab.  She continues to enjoy her classmates and misses it when she is out.  She is now on the treadmill for the full 15 min!!  We will continue to montior her progress.  Brandy [name] has found that coming to the structured class has been very helpful!  She finds that she gets lost in her to-dos at home. We did talk about  using the videos more at home at least twice a week and also resent her the links to help too.  She is feeling better and has already started to notice changes and improve stamina.  Brandy [name] attends consistently adn works in correct RPE range.  Staff will follow up about exercising at home.   Expected Outcomes  Short :  attend consistently Long:  improve stamina  Short: Continue to push self in class Long: Continue to improve stamina.  Short: Continue to improve workloads.  Long: Continue to improve stamina.  Short: Add in chair routine at home  Long: Continue to improve stamina.  Short:  exercise consistently outside program sessions Long: continue to improve stamina   Row Name 09/08/19 1602             Exercise Goal Re-Evaluation   Exercise Goals Review  Increase Physical Activity;Increase Strength and Stamina;Understanding of Exercise Prescription       Comments  Brandy [name] is doing well in rehab.  She is getting in 30-40 min on the T5 at level 3!  We will continue to encourage good attendane and monitor her progress.       Expected Outcomes  Short: Continue to push in class Long; Continue to exercise on off days at home to improve stamina.          Discharge Exercise Prescription (Final Exercise Prescription Changes): Exercise Prescription Changes - 09/08/19 1600      Response to Exercise   Blood Pressure (Admit)  146/64    Blood Pressure (Exercise)  126/82    Blood Pressure (Exit)  140/60    Heart Rate (Admit)  91 bpm    Heart Rate (Exercise)  92 bpm    Heart Rate (Exit)  91 bpm    Oxygen Saturation (Admit)  96 %    Oxygen Saturation (Exercise)  96 %    Oxygen Saturation (Exit)  91 %    Rating of Perceived Exertion (Exercise)  12    Perceived Dyspnea (Exercise)  3    Symptoms  leg and back pain    Duration  Continue with 30 min of aerobic exercise without signs/symptoms of physical distress.    Intensity  THRR unchanged      Progression   Progression  Continue to progress workloads to  maintain intensity without signs/symptoms of physical distress.    Average METs  1.45      Resistance Training   Training Prescription  Yes    Weight  3 lb  Reps  10-15      Interval Training   Interval Training  No      T5 Nustep   Level  3    Minutes  30    METs  1.7      Home Exercise Plan   Plans to continue exercise at  Home (comment)   walking, videos   Frequency  Add 3 additional days to program exercise sessions.    Initial Home Exercises Provided  05/06/19       Nutrition:  Target Goals: Understanding of nutrition guidelines, daily intake of sodium <1557m, cholesterol <2071m calories 30% from fat and 7% or less from saturated fats, daily to have 5 or more servings of fruits and vegetables.  Education: Controlling Sodium/Reading Food Labels -Group verbal and written material supporting the discussion of sodium use in heart healthy nutrition. Review and explanation with models, verbal and written materials for utilization of the food label.   Education: General Nutrition Guidelines/Fats and Fiber: -Group instruction provided by verbal, written material, models and posters to present the general guidelines for heart healthy nutrition. Gives an explanation and review of dietary fats and fiber.   Biometrics: Pre Biometrics - 04/06/19 1717      Pre Biometrics   Height  _0  (1.702 m)    Weight  (!) 374 lb 12.8 oz (170 kg)    BMI (Calculated)  58.69    Single Leg Stand  1.5 seconds        Nutrition Therapy Plan and Nutrition Goals: Nutrition Therapy & Goals - 05/04/19 1300      Nutrition Therapy   Diet  DM, HH, low Na    Drug/Food Interactions  Statins/Certain Fruits    Protein (specify units)  130g    Fiber  25 grams    Whole Grain Foods  3 servings    Saturated Fats  12 max. grams    Fruits and Vegetables  5 servings/day    Sodium  1.5 grams      Personal Nutrition Goals   Nutrition Goal  ST: add 1 fruit/vegetable serving LT: Would like to be  under 165lb    Comments  [employer]ing. Pt reports starting [email or web address] (lose 4% in six months). MyPlate method. 3 meals and 2-3 snacks. Pt reports wanting to meal prep. Discussed DM friendly eating, honoring hunger, expectations. Discussed some meal and snack ideas. will follow up in 2 weeks for more education and goals.      Intervention Plan   Intervention  Prescribe, educate and counsel regarding individualized specific dietary modifications aiming towards targeted core components such as weight, hypertension, lipid management, diabetes, heart failure and other comorbidities.;Nutrition handout(s) given to patient.    Expected Outcomes  Short Term Goal: Understand basic principles of dietary content, such as calories, fat, sodium, cholesterol and nutrients.;Short Term Goal: A plan has been developed with personal nutrition goals set during dietitian appointment.;Long Term Goal: Adherence to prescribed nutrition plan.       Nutrition Assessments:   MEDIFICTS Score Key:          ?70 Need to make dietary changes          40-70 Heart Healthy Diet         ? 40 Therapeutic Level Cholesterol Diet  Nutrition Goals Re-Evaluation: Nutrition Goals Re-Evaluation    [place] 05/18/19 1302 07/21/19 1233 08/12/19 1045 08/31/19 1103 08/31/19 1445     Goals   Nutrition Goal  ST: continue to not drink  pop  LT: Would like to be under 165lb  ST: continue to not drink pop  LT: Would like to be under 165lb  ST: continue to not drink pop  LT: Would like to be under 165lb  --  ST: Continue with [place] diet plan  LT: Would like to be under 165lb   Comment  Able to add 1 F/V a day: strawberries, blueberries, bananas, maderines, cucumber, salads (3x/week), spinach. Pt reports energy levels are good. Pt reports she is emotionally drained; recent loss, friend with new breast cancer diagnosis, and son is a "handful". Pt reports having a good support system. Pt reports not drinking pop - discussed other options  for drinks so she doesnt get bored (hibiscus tea). Pt reports lost 8 lbs, still has strength. Pt would like to continue with pop as a goal as it has been hard for her.  Continue with current changes  Continue with current changes  opened in error  Advised against ignoring hunger and restrictive dieting. Discussed ways to improve health while enjoying food. Pt would like to continue with diet plan. Will monitor.   Expected Outcome  ST: continue to not drink pop  LT: Would like to be under 165lb  ST: continue to not drink pop  LT: Would like to be under 165lb  ST: continue to not drink pop  LT: Would like to be under 165lb  --  ST: Continue with [place] diet plan  LT: Would like to be under 165lb      Nutrition Goals Discharge (Final Nutrition Goals Re-Evaluation): Nutrition Goals Re-Evaluation - 08/31/19 1445      Goals   Nutrition Goal  ST: Continue with [place] diet plan  LT: Would like to be under 165lb    Comment  Advised against ignoring hunger and restrictive dieting. Discussed ways to improve health while enjoying food. Pt would like to continue with diet plan. Will monitor.    Expected Outcome  ST: Continue with  diet plan  LT: Would like to be under 165lb       Psychosocial: Target Goals: Acknowledge presence or absence of significant depression and/or stress, maximize coping skills, provide positive support system. Participant is able to verbalize types and ability to use techniques and skills needed for reducing stress and depression.   Education: Depression - Provides group verbal and written instruction on the correlation between heart/lung disease and depressed mood, treatment options, and the stigmas associated with seeking treatment.   Education: Sleep Hygiene -Provides group verbal and written instruction about how sleep can affect your health.  Define sleep hygiene, discuss sleep cycles and impact of sleep habits. Review good sleep hygiene tips.     Education: Stress and Anxiety: - Provides group verbal and written instruction about the health risks of elevated stress and causes of high stress.  Discuss the correlation between heart/lung disease and anxiety and treatment options. Review healthy ways to manage with stress and anxiety.   Initial Review & Psychosocial Screening: Initial Psych Review & Screening - 07/06/19 0951      Initial Review   Current issues with  Current Depression;Current Psychotropic Meds;History of Depression;Current Stress Concerns;Current Sleep Concerns    Source of Stress Concerns  Chronic Illness;Occupation;Family;Unable to perform yard/household activities    Comments  She is out of work with a sever autistic son. She is generally negative and is trying to be more positive. She talks to a therapist and takes medsication to help her cope better. She is determined  to exercise, get weight off and be more positive.      Family Dynamics   Good Support System?  Yes    Comments  Patient sees a therapist and is taking medication to help support her. She is excited to exercise and be less short of breath.      Barriers   Psychosocial barriers to participate in program  The patient should benefit from training in stress management and relaxation.      Screening Interventions   Interventions  Encouraged to exercise;To provide support and resources with identified psychosocial needs;Provide feedback about the scores to participant    Expected Outcomes  Short Term goal: Utilizing psychosocial counselor, staff and physician to assist with identification of specific Stressors or current issues interfering with healing process. Setting desired goal for each stressor or current issue identified.;Long Term Goal: Stressors or current issues are controlled or eliminated.;Short Term goal: Identification and review with participant of any Quality of Life or Depression concerns found by scoring the questionnaire.;Long Term goal:  The participant improves quality of Life and PHQ9 Scores as seen by post scores and/or verbalization of changes       Quality of Life Scores:  Scores of 19 and below usually indicate a poorer quality of life in these areas.  A difference of  2-3 points is a clinically meaningful difference.  A difference of 2-3 points in the total score of the Quality of Life Index has been associated with significant improvement in overall quality of life, self-image, physical symptoms, and general health in studies assessing change in quality of life.  PHQ-9: Recent Review Flowsheet Data    Depression screen [facility] 2/9 08/25/2019 07/06/2019 04/06/2019 03/30/2019 03/17/2018   Decreased Interest _0 Down, Depressed, Hopeless _1 PHQ - 2 Score _2 Altered sleeping _3 Tired, decreased energy _4 Change in appetite _5 Feeling bad or failure about yourself  _6 Trouble concentrating _7 Moving slowly or fidgety/restless _8 Suicidal thoughts 2 0 0 0 0   PHQ-9 Score _9 Difficult doing work/chores - Extremely dIfficult Extremely dIfficult Extremely dIfficult Not difficult at all     Interpretation of Total Score  Total Score Depression Severity:  1-4 = Minimal depression, 5-9 = Mild depression, 10-14 = Moderate depression, 15-19 = Moderately severe depression, 20-27 = Severe depression   Psychosocial Evaluation and Intervention: Psychosocial Evaluation - 04/01/19 1427      Psychosocial Evaluation & Interventions   Interventions  Stress management education;Encouraged to exercise with the program and follow exercise prescription    Comments  Sandrina is coming to rehab for heart failure and SOB.  She wants to get stronger and improve her mobility and breathing.  She struggles with her sleeping and causes her fall asleep during the day and at work.  She has currently quit her job as she was not functioning.  She wants to be able to  go and do with her kids and family.    Expected Outcomes  Short: Attend rehab regularly  Long: Improve overall stamina    Continue Psychosocial Services   Follow up required by staff       Psychosocial Re-Evaluation: Psychosocial Re-Evaluation  Brandy [name] Name 08/12/19 1157 08/26/19 0948           Psychosocial Re-Evaluation   Current issues with  Current Depression;Current Anxiety/Panic;Current Stress Concerns;Current Sleep Concerns  --      Comments  Brandy [name] continues to see her therapist regularly and feels that she is well managed on her current medication regimen.  Her son is doing better and had a good day yesterday, but still has his moments and these really fluster her.  She continues to work on her positive self talk, she is becoming more aware of it now and just needs to work on being more mindful about it.  She is on a journey for herself and seems to be committed to making these changes.  Shevawn continues to see her therapist weekly.  She continues to waor on positive self talk and mindfulness.      Expected Outcomes  Short: Work on positive self talk  Long: Continue to find positive coping skills.  Short:  continue ot work on positive self talk Long:  maintain positive attitude      Interventions  Stress management education;Encouraged to attend Cardiac Rehabilitation for the exercise  --      Continue Psychosocial Services   Follow up required by staff  --         Psychosocial Discharge (Final Psychosocial Re-Evaluation): Psychosocial Re-Evaluation - 08/26/19 0948      Psychosocial Re-Evaluation   Comments  Brandy [name] continues to see her therapist weekly.  She continues to waor on positive self talk and mindfulness.    Expected Outcomes  Short:  continue ot work on positive self talk Long:  maintain positive attitude       Education: Education Goals: Education classes will be provided on a weekly basis, covering required topics. Participant will state understanding/return demonstration  of topics presented.  Learning Barriers/Preferences: Learning Barriers/Preferences - 04/01/19 1414      Learning Barriers/Preferences   Learning Barriers  Sight   glasses   Learning Preferences  Skilled Demonstration;Video       General Pulmonary Education Topics:  Infection Prevention: - Provides verbal and written material to individual with discussion of infection control including proper hand washing and proper equipment cleaning during exercise session.   Pulmonary Rehab from 04/06/2019 in [facility] Cardiac and Pulmonary Rehab  Date  04/06/19  Educator  AS  Instruction Review Code  1- Verbalizes Understanding      Falls Prevention: - Provides verbal and written material to individual with discussion of falls prevention and safety.   Pulmonary Rehab from 04/06/2019 in [facility] Cardiac and Pulmonary Rehab  Date  04/06/19  Educator  AS  Instruction Review Code  1- Verbalizes Understanding      Chronic Lung Diseases: - Group verbal and written instruction to review updates, respiratory medications, advancements in procedures and treatments. Discuss use of supplemental oxygen including available portable oxygen systems, continuous and intermittent flow rates, concentrators, personal use and safety guidelines. Review proper use of inhaler and spacers. Provide informative websites for self-education.    Energy Conservation: - Provide group verbal and written instruction for methods to conserve energy, plan and organize activities. Instruct on pacing techniques, use of adaptive equipment and posture/positioning to relieve shortness of breath.   Triggers and Exacerbations: - Group verbal and written instruction to review types of environmental triggers and ways to prevent exacerbations. Discuss weather changes, air quality and the benefits of nasal washing. Review warning signs and symptoms to help prevent infections. Discuss techniques for  effective airway clearance, coughing, and  vibrations.   AED/CPR: - Group verbal and written instruction with the use of models to demonstrate the basic use of the AED with the basic ABC's of resuscitation.   Anatomy and Physiology of the Lungs: - Group verbal and written instruction with the use of models to provide basic lung anatomy and physiology related to function, structure and complications of lung disease.   Anatomy & Physiology of the Heart: - Group verbal and written instruction and models provide basic cardiac anatomy and physiology, with the coronary electrical and arterial systems. Review of Valvular disease and Heart Failure   Cardiac Medications: - Group verbal and written instruction to review commonly prescribed medications for heart disease. Reviews the medication, class of the drug, and side effects.   Other: -Provides group and verbal instruction on various topics (see comments)   Knowledge Questionnaire Score: Knowledge Questionnaire Score - 04/06/19 1711      Knowledge Questionnaire Score   Pre Score  16/18        Core Components/Risk Factors/Patient Goals at Admission: Personal Goals and Risk Factors at Admission - 04/06/19 1714      Core Components/Risk Factors/Patient Goals on Admission    Weight Management  Yes    Intervention  Weight Management/Obesity: Establish reasonable short term and long term weight goals.    Admit Weight  374 lb 12.8 oz (170 kg)    Goal Weight: Short Term  365 lb (165.6 kg)    Goal Weight: Long Term  355 lb (161 kg)    Expected Outcomes  Short Term: Continue to assess and modify interventions until short term weight is achieved;Long Term: Adherence to nutrition and physical activity/exercise program aimed toward attainment of established weight goal;Weight Maintenance: Understanding of the daily nutrition guidelines, which includes 25-35% calories from fat, 7% or less cal from saturated fats, less than 297m cholesterol, less than 1.5gm of sodium, & 5 or more servings  of fruits and vegetables daily;Weight Loss: Understanding of general recommendations for a balanced deficit meal plan, which promotes 1-2 lb weight loss per week and includes a negative energy balance of [phone] kcal/d;Understanding recommendations for meals to include 15-35% energy as protein, 25-35% energy from fat, 35-60% energy from carbohydrates, less than 2078mof dietary cholesterol, 20-35 gm of total fiber daily;Understanding of distribution of calorie intake throughout the day with the consumption of 4-5 meals/snacks;Weight Gain: Understanding of general recommendations for a high calorie, high protein meal plan that promotes weight gain by distributing calorie intake throughout the day with the consumption for 4-5 meals, snacks, and/or supplements       Education:Diabetes - Individual verbal and written instruction to review signs/symptoms of diabetes, desired ranges of glucose level fasting, after meals and with exercise. Acknowledge that pre and post exercise glucose checks will be done for 3 sessions at entry of program.   Education: Know Your Numbers and Risk Factors: -Group verbal and written instruction about important numbers in your health.  Discussion of what are risk factors and how they play a role in the disease process.  Review of Cholesterol, Blood Pressure, Diabetes, and BMI and the role they play in your overall health.   Core Components/Risk Factors/Patient Goals Review:  Goals and Risk Factor Review    Row Name 05/27/19 1205 06/24/19 1147 08/12/19 1155 08/26/19 0952       Core Components/Risk Factors/Patient Goals Review   Personal Goals Review  Weight Management/Obesity;Hypertension;Heart Failure  Weight Management/Obesity;Hypertension;Heart Failure  Weight Management/Obesity;Hypertension;Heart Failure;Improve  shortness of breath with ADL's  Weight Management/Obesity;Hypertension;Heart Failure;Improve shortness of breath with ADL's    Review  She was discouraged today  as her weight was up 2 lb and she was feeling more SOB than normal.  We talked about possible causes and she admitted to doing more at home the last few days (more than her normal) and thinks she has overdone it.  We talked about DOMS and how it can make you feel worse for a few days but it will improve.  This is also a symptoms of her heart failure so we talked about combatting that as well.  She is going to give it another day to see if she improves and move a little more today to help release soreness and get fluid moving out.  She has not been checking her blood pressures routinely but will start to get into that habit since the app will remind her to track it.  She is eager to get moving and to start feeling better again.  Her weight has been going up and down and she wants to lose the weight for good.  She is now committed to coming to in person rehab.  Brandy [name] is doing better overall.  She is feeling better and working on the weight loss.  She went up some and got discouraged, but has already started to lose it again.  Her pressures have been good.  She monitoring her heart failure symptoms and thinks her swelling is improving. She is still having SOB with any activity but it is improving.  Brandy [name] is taking meds as directed.  She started a new health and wellness program.  She really enjoys the T5 and is happy to find a machine she can be successful on.    Expected Outcomes  Short: Watch weight closely with heart failure.  Long: Manage heart failure  Short: Work on weight loss.  Long: Continue to manage heart failure.  Short: Work on weight loss.  Long: Continue to manage heart failure.  Short: continue to work on healthy habits Long: continue to manage risk factors       Core Components/Risk Factors/Patient Goals at Discharge (Final Review):  Goals and Risk Factor Review - 08/26/19 0952      Core Components/Risk Factors/Patient Goals Review   Personal Goals Review  Weight  Management/Obesity;Hypertension;Heart Failure;Improve shortness of breath with ADL's    Review  Brandy [name] is taking meds as directed.  She started a new health and wellness program.  She really enjoys the T5 and is happy to find a machine she can be successful on.    Expected Outcomes  Short: continue to work on healthy habits Long: continue to manage risk factors       ITP Comments: ITP Comments    Row Name 04/01/19 1440 04/06/19 1718 04/14/19 1420 04/22/19 1246 04/29/19 0927   ITP Comments  Completed virtual orientation today.  Documentation for diagnosis [facility] be [facility].  EP eval scheduled for 12/7 at 3pm.  Completed 6MWT.  Initital ITP created and sent to Dr Dortha Schwalbe called to day and wants to wait until after the first of the year to reconsider the program.  She is concerned about the number of people at the main entrance to the hospital, valet, etc.  She felt very safe in the Rehab area.  Brandy [name] will be out until after the new year.  30 day review competed . ITP sent to Dr Emily Filbert for  review, changes as needed and ITP approval signature   Row Name 05/04/19 1340 05/06/19 1148 05/18/19 1335 05/27/19 1205 06/24/19 1146   ITP Comments  Completed initial RD eval  Completed virtual appt today.  Reviewed Virtual Home Exercise Guidelines and program.  Brandy [name] wants to do the fully virtual program while COVID-19 is still high as she is not comfortable coming into the hospital at this time.  Once things calm down, she would like to do the in person program.  Enrolled in [employer].  Completed 2 week f/u  Completed virtual visit today.  Next follow up scheduled with Melissa on 2/1 and me on 2/10.  She would like to see if she can change to a Thurs with Melissa so it's not as long between calls.  Completed virtual follow up today.  Brandy [name] is ready to return to in person rehab.  She is scheduled to restart next week.  We will do a walk test and new ITP on her first visit.   Brandy [name] Name 06/29/19  [phone] 07/22/19 1044 08/19/19 0640 08/31/19 1449 09/16/19 0805   ITP Comments  First full day of exercise!  Patient was oriented to gym and equipment including functions, settings, policies, and procedures.  Patient's individual exercise prescription and treatment plan were reviewed.  All starting workloads were established based on the results of the 6 minute walk test done at initial orientation visit.  The plan for exercise progression was also introduced and progression will be customized based on patient's performance and goals.  30 day chart review completed. ITP sent to Dr Zachery Dakins Medical Director, for review,changes as needed and signature.  30 Day review completed. Medical Director review done, changes made as directed,and approval shown by signature of Market researcher.  Completed Nutrition Re-Evaluation  30 Day review completed. Medical Director review done, changes made as directed,and approval shown by signature of Market researcher.      Comments:

## 2019-09-17 DIAGNOSIS — F411 Generalized anxiety disorder: Secondary | ICD-10-CM | POA: Diagnosis not present

## 2019-09-17 DIAGNOSIS — F5105 Insomnia due to other mental disorder: Secondary | ICD-10-CM | POA: Diagnosis not present

## 2019-09-17 DIAGNOSIS — F331 Major depressive disorder, recurrent, moderate: Secondary | ICD-10-CM | POA: Diagnosis not present

## 2019-09-18 ENCOUNTER — Other Ambulatory Visit: Payer: Self-pay | Admitting: Physical Medicine and Rehabilitation

## 2019-09-18 ENCOUNTER — Other Ambulatory Visit: Payer: Self-pay | Admitting: Primary Care

## 2019-09-18 ENCOUNTER — Encounter: Payer: BC Managed Care – PPO | Admitting: *Deleted

## 2019-09-18 ENCOUNTER — Other Ambulatory Visit
Admission: RE | Admit: 2019-09-18 | Discharge: 2019-09-18 | Disposition: A | Discharge status: Home / Self Care | Payer: BC Managed Care – PPO | Source: Ambulatory Visit | Attending: Neurology | Admitting: Neurology

## 2019-09-18 ENCOUNTER — Other Ambulatory Visit: Payer: Self-pay

## 2019-09-18 DIAGNOSIS — Z01812 Encounter for preprocedural laboratory examination: Secondary | ICD-10-CM | POA: Insufficient documentation

## 2019-09-18 DIAGNOSIS — Z6841 Body Mass Index (BMI) 40.0 and over, adult: Secondary | ICD-10-CM | POA: Diagnosis not present

## 2019-09-18 DIAGNOSIS — M2550 Pain in unspecified joint: Secondary | ICD-10-CM

## 2019-09-18 DIAGNOSIS — Z713 Dietary counseling and surveillance: Secondary | ICD-10-CM | POA: Diagnosis not present

## 2019-09-18 DIAGNOSIS — I509 Heart failure, unspecified: Secondary | ICD-10-CM | POA: Diagnosis not present

## 2019-09-18 DIAGNOSIS — G8929 Other chronic pain: Secondary | ICD-10-CM

## 2019-09-18 DIAGNOSIS — M5136 Other intervertebral disc degeneration, lumbar region: Secondary | ICD-10-CM

## 2019-09-18 DIAGNOSIS — Z20822 Contact with and (suspected) exposure to covid-19: Secondary | ICD-10-CM | POA: Diagnosis not present

## 2019-09-18 DIAGNOSIS — I872 Venous insufficiency (chronic) (peripheral): Secondary | ICD-10-CM | POA: Diagnosis not present

## 2019-09-18 DIAGNOSIS — E1159 Type 2 diabetes mellitus with other circulatory complications: Secondary | ICD-10-CM | POA: Diagnosis not present

## 2019-09-18 DIAGNOSIS — R0600 Dyspnea, unspecified: Secondary | ICD-10-CM | POA: Diagnosis not present

## 2019-09-18 DIAGNOSIS — I5032 Chronic diastolic (congestive) heart failure: Secondary | ICD-10-CM

## 2019-09-18 NOTE — Progress Notes (Signed)
Daily Session Note  Patient Details  Name: RADIANCE DEADY MRN: [id number] Date of Birth: 05-21-1965 Referring Provider:     Pulmonary Rehab from 04/06/2019 in [facility]iac and Pulmonary Rehab  Referring Provider  Gollan      Encounter Date: 09/18/2019  Check In: Session Check In - 09/18/19 0946      Check-In   Supervising physician immediately available to respond to emergencies  See telemetry face sheet for immediately available ER MD    Location  [facility]-Cardiac & Pulmonary Rehab    Staff Present  Renita Papa, RN BSN;Joseph [address] [place], [place], [place], [place], [facility]    Virtual Visit  No    Medication changes reported      No    Fall or balance concerns reported     No    Warm-up and Cool-down  Performed on first and last piece of equipment    Resistance Training Performed  Yes    VAD Patient?  No    PAD/SET Patient?  No      Pain Assessment   Currently in Pain?  No/denies          Social History   Tobacco Use  Smoking Status Never Smoker  Smokeless Tobacco Never Used    Goals Met:  Independence with exercise equipment Exercise tolerated well No report of cardiac concerns or symptoms Strength training completed today  Goals Unmet:  Not Applicable  Comments: Pt able to follow exercise prescription today without complaint.  Will continue to monitor for progression.    Dr. Emily Filbert is Medical Director for [place] [facility].

## 2019-09-18 NOTE — Telephone Encounter (Signed)
Refills sent to pharmacy. 

## 2019-09-18 NOTE — Telephone Encounter (Signed)
Last prescribed on 04/06/2019 . Last OV (video visit) on 09/15/2019. No future OV scheduled

## 2019-09-19 LAB — SARS CORONAVIRUS 2 (TAT 6-24 HRS): SARS Coronavirus 2: NEGATIVE

## 2019-09-21 ENCOUNTER — Other Ambulatory Visit: Payer: Self-pay | Admitting: Sports Medicine

## 2019-09-21 ENCOUNTER — Ambulatory Visit (INDEPENDENT_AMBULATORY_CARE_PROVIDER_SITE_OTHER): Payer: BC Managed Care – PPO | Admitting: Neurology

## 2019-09-21 DIAGNOSIS — F321 Major depressive disorder, single episode, moderate: Secondary | ICD-10-CM

## 2019-09-21 DIAGNOSIS — Z6841 Body Mass Index (BMI) 40.0 and over, adult: Secondary | ICD-10-CM

## 2019-09-21 DIAGNOSIS — E1140 Type 2 diabetes mellitus with diabetic neuropathy, unspecified: Secondary | ICD-10-CM

## 2019-09-21 DIAGNOSIS — G4733 Obstructive sleep apnea (adult) (pediatric): Secondary | ICD-10-CM

## 2019-09-21 DIAGNOSIS — I5031 Acute diastolic (congestive) heart failure: Secondary | ICD-10-CM

## 2019-09-21 DIAGNOSIS — F411 Generalized anxiety disorder: Secondary | ICD-10-CM

## 2019-09-22 ENCOUNTER — Telehealth (INDEPENDENT_AMBULATORY_CARE_PROVIDER_SITE_OTHER): Payer: BC Managed Care – PPO | Admitting: Psychology

## 2019-09-22 ENCOUNTER — Other Ambulatory Visit: Payer: Self-pay

## 2019-09-22 DIAGNOSIS — F5089 Other specified eating disorder: Secondary | ICD-10-CM

## 2019-09-22 DIAGNOSIS — F331 Major depressive disorder, recurrent, moderate: Secondary | ICD-10-CM | POA: Diagnosis not present

## 2019-09-23 ENCOUNTER — Encounter (INDEPENDENT_AMBULATORY_CARE_PROVIDER_SITE_OTHER): Payer: Self-pay | Admitting: Family Medicine

## 2019-09-23 ENCOUNTER — Encounter: Payer: BC Managed Care – PPO | Admitting: *Deleted

## 2019-09-23 ENCOUNTER — Ambulatory Visit (INDEPENDENT_AMBULATORY_CARE_PROVIDER_SITE_OTHER): Payer: BC Managed Care – PPO | Admitting: Family Medicine

## 2019-09-23 ENCOUNTER — Other Ambulatory Visit: Payer: Self-pay

## 2019-09-23 VITALS — BP 106/61 | HR 112 | Temp 98.0°F | Ht 66.0 in | Wt 370.0 lb

## 2019-09-23 DIAGNOSIS — E559 Vitamin D deficiency, unspecified: Secondary | ICD-10-CM | POA: Diagnosis not present

## 2019-09-23 DIAGNOSIS — Z9189 Other specified personal risk factors, not elsewhere classified: Secondary | ICD-10-CM

## 2019-09-23 DIAGNOSIS — G4733 Obstructive sleep apnea (adult) (pediatric): Secondary | ICD-10-CM | POA: Insufficient documentation

## 2019-09-23 DIAGNOSIS — M545 Low back pain: Secondary | ICD-10-CM | POA: Diagnosis not present

## 2019-09-23 DIAGNOSIS — I509 Heart failure, unspecified: Secondary | ICD-10-CM | POA: Diagnosis not present

## 2019-09-23 DIAGNOSIS — R0600 Dyspnea, unspecified: Secondary | ICD-10-CM | POA: Diagnosis not present

## 2019-09-23 DIAGNOSIS — E1169 Type 2 diabetes mellitus with other specified complication: Secondary | ICD-10-CM | POA: Diagnosis not present

## 2019-09-23 DIAGNOSIS — Z713 Dietary counseling and surveillance: Secondary | ICD-10-CM | POA: Diagnosis not present

## 2019-09-23 DIAGNOSIS — I5032 Chronic diastolic (congestive) heart failure: Secondary | ICD-10-CM

## 2019-09-23 DIAGNOSIS — M6281 Muscle weakness (generalized): Secondary | ICD-10-CM | POA: Diagnosis not present

## 2019-09-23 DIAGNOSIS — M5417 Radiculopathy, lumbosacral region: Secondary | ICD-10-CM | POA: Diagnosis not present

## 2019-09-23 DIAGNOSIS — E1159 Type 2 diabetes mellitus with other circulatory complications: Secondary | ICD-10-CM | POA: Diagnosis not present

## 2019-09-23 DIAGNOSIS — Z6841 Body Mass Index (BMI) 40.0 and over, adult: Secondary | ICD-10-CM

## 2019-09-23 DIAGNOSIS — Z794 Long term (current) use of insulin: Secondary | ICD-10-CM

## 2019-09-23 DIAGNOSIS — M62838 Other muscle spasm: Secondary | ICD-10-CM | POA: Diagnosis not present

## 2019-09-23 DIAGNOSIS — I872 Venous insufficiency (chronic) (peripheral): Secondary | ICD-10-CM | POA: Diagnosis not present

## 2019-09-23 DIAGNOSIS — E6609 Other obesity due to excess calories: Secondary | ICD-10-CM | POA: Insufficient documentation

## 2019-09-23 NOTE — Procedures (Signed)
PATIENT'S NAME:  Brandy [name], Brandy [name] DOB:      Apr 25, 1966      MR#:    [id number]     DATE OF RECORDING: 09/21/2019 REFERRING M.D.:  Alma Friendly, NP Study Performed:   Titration to positive airway pressure  HISTORY:  Brandy [name] is returning for a CPAP Titration sleep study following a Baseline PSG from 07/27/19 which revealed an AHI of 35.1/h, supine AHI of 29.3/h, non-supine AHI of 38.4/h. The patient was a loud snorer, but had no significant hypoxemia or ECG abnormalities.   The patient endorsed the Epworth Sleepiness Scale at 13/24 points.   The patient's weight 381 pounds with a height of 67 (inches), resulting in a BMI of 59.9 kg/m2. The patient's neck circumference measured 18.5 inches.  CURRENT MEDICATIONS: Abilify, Aspirin, Lipitor, Celebrex, Zyrtec, Vitamin D3, Celexa, Vitamin B12, Cymbalta, Folvite, Neurontin, Glucotrol, Zestoretic, Glucophage, Metronidazole cream, Multivitamin, Desyrel, Drisdol.    PROCEDURE:  This is a multichannel digital polysomnogram utilizing the SomnoStar 11.2 system.  Electrodes and sensors were applied and monitored per AASM Specifications.   EEG, EOG, Chin and Limb EMG, were sampled at 200 Hz.  ECG, Snore and Nasal Pressure, Thermal Airflow, Respiratory Effort, CPAP Flow and Pressure, Oximetry was sampled at 50 Hz. Digital video and audio were recorded.       CPAP was initiated with a Respironics DreamWear FFM in medium size. Beginning at 5 cmH20 with heated humidity per AASM split night standards from where pressure was advanced to 13 cmH20 because of hypopneas, apneas and desaturations.  At a PAP pressure of 13 cmH20, there was a reduction of the AHI to 0 with improvement of sleep apnea but only 15 minutes of sleep were recorded. SpO2 nadir was 93%. Sleep efficiency was only 48%.  The patient did much better at 10 and 12 cm water pressure with residual AG HI of 2.3 and 2.8/h   Lights Out was at 22:21 and Lights On at 04:59. Total recording time (TRT) was  398.5 minutes, with a total sleep time (TST) of 304.5 minutes. The patient's sleep latency was 42 minutes. REM latency was 0 minutes.  The sleep efficiency was 76.4 %.    SLEEP ARCHITECTURE: WASO (Wake after sleep onset) was 76 minutes.  There were 12.5 minutes in Stage N1, 270.5 minutes Stage N2, 21.5 minutes Stage N3 and 0 minutes in Stage REM.  The percentage of Stage N1 was 4.1%, Stage N2 was 88.8%, Stage N3 was 7.1% and Stage R (REM sleep) was 0%. The sleep architecture was notable for REM sleep loss.   RESPIRATORY ANALYSIS:  There was a total of 26 respiratory events: 1 obstructive apnea, 3 central apneas and 0 mixed apneas with a total of 4 apneas and an apnea index (AHI) of 0.8 /hour. There were 22 hypopneas with a hypopnea index of 4.3/hour. The patient also had few respiratory event related arousals (RERAs).      The total APNEA/HYPOPNEA INDEX (AHI) was 5.1 /hour .  0 events occurred in REM sleep and 26 events in NREM. The REM AHI was 0 /hour versus a non-REM AHI of 5.1 /hour.  The patient spent 179.5 minutes of total sleep time in the supine position and 125 minutes in non-supine. The supine AHI was 6.3, versus a non-supine AHI of 3.4.  OXYGEN SATURATION & C02:  The baseline 02 saturation was 98%, with the lowest being 87%. Time spent below 89% saturation equaled 1 minute.  The arousals were noted as: 48 were spontaneous,  0 were associated with PLMs, 7 were associated with respiratory events. The patient had a total of 0 Periodic Limb Movements.   Audio and video analysis did not show any abnormal or unusual movements, behaviors, phonations or vocalizations.  Snoring was noted.  EKG was in keeping with normal sinus rhythm.  DIAGNOSIS 1. Obstructive Sleep Apnea was alleviated at CPAP pressures of 10,12, and 13 cm water. CPAP was initiated with a Respironics DreamWear FFM in medium size.   PLANS/RECOMMENDATIONS: Respironics DreamWear FFM in medium size. I ordered an autotitration capable  CPAP device, settings from 7 through 15 cm water, 2 cm EPR and  heated humidity.   1. CPAP therapy compliance is defined as 4 hours or more of nightly use.  2. Weight loss is helpful in reducing hypoventilation and apnea.  3. Any sleep apnea patient should avoid using sedatives, hypnotics, and alcohol consumption.    DISCUSSION: A follow up appointment will be scheduled with the NP in the Sleep Clinic at [facility].   Please call [phone] with any questions.      I certify that I have reviewed the entire raw data recording prior to the issuance of this report in accordance with the Standards of Accreditation of the American Academy of Sleep Medicine (AASM)      [] , M.D. Diplomat, Melvyn Novas of Psychiatry and Neurology  Diplomat, Biomedical engineer of Sleep Medicine Biomedical engineer, Wellsite geologist Sleep at [employer]

## 2019-09-23 NOTE — Addendum Note (Signed)
Addended by: Melvyn Novas on: 09/23/2019 04:20 PM   Modules accepted: Orders

## 2019-09-23 NOTE — Progress Notes (Signed)
Daily Session Note  Patient Details  Name: Brandy [name] MRN: [id number] Date of Birth: January 27, 1966 Referring Provider:     Pulmonary Rehab from 04/06/2019 in [facility] Cardiac and Pulmonary Rehab  Referring Provider  Gollan      Encounter Date: 09/23/2019  Check In: Session Check In - 09/23/19 1002      Check-In   Supervising physician immediately available to respond to emergencies  See telemetry face sheet for immediately available ER MD    Location  [facility]-Cardiac & Pulmonary Rehab    Staff Present  Renita Papa, RN BSN;Joseph Hood RCP,RRT,BSRT;Melissa [place] RDN, Rowe Pavy, BA, ACSM CEP, Exercise Physiologist    Virtual Visit  No    Medication changes reported      No    Fall or balance concerns reported     No    Warm-up and Cool-down  Performed on first and last piece of equipment    Resistance Training Performed  Yes    VAD Patient?  No    PAD/SET Patient?  No      Pain Assessment   Currently in Pain?  No/denies          Social History   Tobacco Use  Smoking Status Never Smoker  Smokeless Tobacco Never Used    Goals Met:  Independence with exercise equipment Exercise tolerated well No report of cardiac concerns or symptoms Strength training completed today  Goals Unmet:  Not Applicable  Comments: Pt able to follow exercise prescription today without complaint.  Will continue to monitor for progression.    Dr. Emily Filbert is Medical Director for [place] [facility].

## 2019-09-23 NOTE — Progress Notes (Signed)
DIAGNOSIS  1. Obstructive Sleep Apnea was alleviated at CPAP pressures of  10,12, and 13 cm water. CPAP was initiated with a Respironics  DreamWear FFM in medium size.    PLANS/RECOMMENDATIONS: Respironics DreamWear FFM in medium size.  I ordered an autotitration capable CPAP device, settings from 7  through 15 cm water, 2 cm EPR and heated humidity.   1. CPAP therapy compliance is defined as 4 hours or more of  nightly use.  2. Weight loss is helpful in reducing hypoventilation and apnea.  3. Any sleep apnea patient should avoid using sedatives,  hypnotics, and alcohol consumption.    DISCUSSION: A follow up appointment will be scheduled with the  NP in the Sleep Clinic at [facility].   Please call [phone] with any questions.

## 2019-09-24 ENCOUNTER — Telehealth: Payer: Self-pay | Admitting: Neurology

## 2019-09-24 ENCOUNTER — Ambulatory Visit (INDEPENDENT_AMBULATORY_CARE_PROVIDER_SITE_OTHER): Payer: BC Managed Care – PPO | Admitting: Psychology

## 2019-09-24 DIAGNOSIS — F3289 Other specified depressive episodes: Secondary | ICD-10-CM

## 2019-09-24 MED ORDER — VITAMIN D (ERGOCALCIFEROL) 1.25 MG (50000 UNIT) PO CAPS: 50000.0000 [IU] | ORAL_CAPSULE | ORAL | 0 refills | Status: DC

## 2019-09-24 NOTE — Progress Notes (Signed)
Chief Complaint:   OBESITY Brandy [name] is here to discuss her progress with her obesity treatment plan along with follow-up of her obesity related diagnoses. Brandy [name] is on the Category 3 Plan and states she is following her eating plan approximately 85% of the time. Brandy [name] states she is riding the bike, and lifting weights for 60 minutes 1-2 times per week.  Today's visit was #: 3 Starting weight: 383 lbs Starting date: 08/25/2019 Today's weight: 370 lbs Today's date: 09/23/2019 Total lbs lost to date: 13 Total lbs lost since last in-office visit: 5  Interim History: Brandy [name] continues to do  Well with weight loss on her Category 3 plan. Her hunger is controlled and she denies feeling deprived. She is getting a bit bored with her diet prescription and would like more ideas on how to make dinner more interesting.  Subjective:   1. Vitamin D deficiency Brandy [name] is stable on Vit D, and she denies nausea, vomiting, or muscle weakness.  2. Type 2 diabetes mellitus with other specified complication, with long-term current use of insulin (Brandy [name]) Brandy [name] is stable on her medications and is doing very well with diet and weight loss.  3. At risk for osteoporosis Brandy [name] is at higher risk of osteopenia and osteoporosis due to Vitamin D deficiency.   Assessment/Plan:   1. Vitamin D deficiency Low Vitamin D level contributes to fatigue and are associated with obesity, breast, and colon cancer. We will refill prescription Vitamin D for 1 month. Brandy [name] will follow-up for routine testing of Vitamin D, at least 2-3 times per year to avoid over-replacement. We will recheck labs in 2 months.  - Vitamin D, Ergocalciferol, (DRISDOL) 1.25 MG (50000 UNIT) CAPS capsule; Take 1 capsule (50,000 Units total) by mouth every 3 (three) days.  Dispense: 10 capsule; Refill: 0  2. Type 2 diabetes mellitus with other specified complication, with long-term current use of insulin (Brandy [name]) Good blood sugar control is important to decrease the  likelihood of diabetic complications such as nephropathy, neuropathy, limb loss, blindness, coronary artery disease, and death. Intensive lifestyle modification including diet, exercise and weight loss are the first line of treatment for diabetes. Brandy [name] will continue with diet and exercise, and we will recheck labs in 2 months.  3. At risk for osteoporosis Brandy [name] was given approximately 15 minutes of osteoporosis prevention counseling today. Brandy [name] is at risk for osteopenia and osteoporosis due to her Vitamin D deficiency. She was encouraged to take her Vitamin D and follow her higher calcium diet and increase strengthening exercise to help strengthen her bones and decrease her risk of osteopenia and osteoporosis.  Repetitive spaced learning was employed today to elicit superior memory formation and behavioral change.  4. Class 3 severe obesity with serious comorbidity and body mass index (BMI) of 50.0 to 59.9 in adult, unspecified obesity type (Brandy [name]) Brandy [name] is currently in the action stage of change. As such, her goal is to continue with weight loss efforts. She has agreed to the Category 3 Plan.   Exercise goals: As is.  Behavioral modification strategies: meal planning and cooking strategies.  Brandy [name] has agreed to follow-up with our clinic in 2 weeks. She was informed of the importance of frequent follow-up visits to maximize her success with intensive lifestyle modifications for her multiple health conditions.   Objective:   Blood pressure 106/61, pulse (!) 112, temperature 98 F (36.7 C), temperature source Oral, height 5\' 6"  (1.676 m), weight (!) 370 lb (167.8 kg), SpO2 96 %. Body mass  index is 59.72 kg/m.  General: Cooperative, alert, well developed, in no acute distress. HEENT: Conjunctivae and lids unremarkable. Cardiovascular: Regular rhythm.  Lungs: Normal work of breathing. Neurologic: No focal deficits.   Lab Results  Component Value Date   CREATININE 0.83 08/25/2019   BUN 22  08/25/2019   NA 136 08/25/2019   K 4.8 08/25/2019   CL 100 08/25/2019   CO2 23 08/25/2019   Lab Results  Component Value Date   ALT 12 08/25/2019   AST 12 08/25/2019   ALKPHOS 110 08/25/2019   BILITOT 0.3 08/25/2019   Lab Results  Component Value Date   HGBA1C 7.8 (H) 08/25/2019   HGBA1C 9.2 (A) 06/17/2019   HGBA1C 9.1 (A) 03/18/2019   HGBA1C 8.9 (A) 12/10/2018   HGBA1C 8.5 (H) 08/29/2018   Lab Results  Component Value Date   INSULIN 101.0 (H) 08/25/2019   Lab Results  Component Value Date   TSH 1.720 08/25/2019   Lab Results  Component Value Date   CHOL 162 08/25/2019   HDL 69 08/25/2019   LDLCALC 73 08/25/2019   LDLDIRECT 108.3 09/14/2010   TRIG 114 08/25/2019   CHOLHDL 3 03/18/2019   Lab Results  Component Value Date   WBC 8.0 08/25/2019   HGB 12.7 08/25/2019   HCT 39.4 08/25/2019   MCV 93 08/25/2019   PLT 296 08/25/2019   No results found for: IRON, TIBC, FERRITIN  Attestation Statements:   Reviewed by clinician on day of visit: allergies, medications, problem list, medical history, surgical history, family history, social history, and previous encounter notes.   I, Burt Knack, am acting as transcriptionist for Quillian Quince, MD.  I have reviewed the above documentation for accuracy and completeness, and I agree with the above. -  Quillian Quince, MD

## 2019-09-24 NOTE — Telephone Encounter (Signed)
-----   Message from Melvyn Novas, MD sent at 09/23/2019  4:20 PM EDT ----- DIAGNOSIS  1. Obstructive Sleep Apnea was alleviated at CPAP pressures of  10,12, and 13 cm water. CPAP was initiated with a Respironics  DreamWear FFM in medium size.    PLANS/RECOMMENDATIONS: Respironics DreamWear FFM in medium size.  I ordered an autotitration capable CPAP device, settings from 7  through 15 cm water, 2 cm EPR and heated humidity.   1. CPAP therapy compliance is defined as 4 hours or more of  nightly use.  2. Weight loss is helpful in reducing hypoventilation and apnea.  3. Any sleep apnea patient should avoid using sedatives,  hypnotics, and alcohol consumption.    DISCUSSION: A follow up appointment will be scheduled with the  NP in the Sleep Clinic at [facility].   Please call [phone] with any questions.

## 2019-09-24 NOTE — Telephone Encounter (Signed)
Called patient to discuss sleep study results. No answer at this time. LVM for the patient to call back.   

## 2019-09-25 ENCOUNTER — Ambulatory Visit
Admission: RE | Admit: 2019-09-25 | Discharge: 2019-09-25 | Disposition: A | Discharge status: Home / Self Care | Payer: BC Managed Care – PPO | Source: Ambulatory Visit | Attending: Physical Medicine and Rehabilitation | Admitting: Physical Medicine and Rehabilitation

## 2019-09-25 ENCOUNTER — Other Ambulatory Visit: Payer: Self-pay

## 2019-09-25 DIAGNOSIS — M5137 Other intervertebral disc degeneration, lumbosacral region: Secondary | ICD-10-CM | POA: Diagnosis not present

## 2019-09-25 DIAGNOSIS — M5136 Other intervertebral disc degeneration, lumbar region: Secondary | ICD-10-CM

## 2019-09-25 MED ORDER — METHYLPREDNISOLONE ACETATE 40 MG/ML INJ SUSP (RADIOLOG
120.0000 mg | Freq: Once | INTRAMUSCULAR | Status: AC
  Administered 2019-09-25: 120 mg via EPIDURAL

## 2019-09-25 MED ORDER — IOPAMIDOL (ISOVUE-M 200) INJECTION 41%
1.0000 mL | Freq: Once | INTRAMUSCULAR | Status: AC
  Administered 2019-09-25: 1 mL via EPIDURAL

## 2019-09-25 NOTE — Discharge Instructions (Signed)

## 2019-09-29 NOTE — Progress Notes (Signed)
  Office: [phone]  /  Fax: [phone]    Date: October 12, 2019   Appointment Start Time: 2:31pm Duration: 22 minutes Provider: Lawerance Cruel, Psy.D. Type of Session: Individual Therapy  Location of Patient: Parked in car at home Location of Provider: Provider's Home Type of Contact: Telepsychological Visit via MyChart Video Visit  Session Content: Brandy [name] is a 54 y.o. female presenting via MyChart Video Visit for a follow-up appointment to address the previously established treatment goal of increasing coping skills. Today's appointment was a telepsychological visit due to COVID-19. Brandy [name] provided verbal consent for today's telepsychological appointment and she is aware she is responsible for securing confidentiality on her end of the session. Prior to proceeding with today's appointment, Brandy [name]'s physical location at the time of this appointment was obtained as well a phone number she could be reached at in the event of technical difficulties. Brandy [name] and this provider participated in today's telepsychological service. Of note, this provider called Brandy [name] at 2:42pm as connection was lost on MyChart Video Visit. She provided verbal consent to continue via a regular telephone call.   This provider conducted a brief check-in. Brandy [name] shared she is losing weight and shared about a recent outpatient surgery. Triggers for emotional eating were reviewed. Today's appointment focused on mindfulness. She discussed being "intentional" with her actions. Further psychoeducation regarding mindfulness was provided and she was engaged in an exercise involving her senses. Brandy [name] provided verbal consent during today's appointment for this provider to send a handout about mindfulness via e-mail. Brandy [name] was receptive to today's appointment as evidenced by openness to sharing, responsiveness to feedback, and willingness to engage in mindfulness exercises to assist with coping.  Mental Status Examination:  Appearance: well groomed  and appropriate hygiene  Behavior: appropriate to circumstances Mood: euthymic Affect: mood congruent Speech: normal in rate, volume, and tone Eye Contact: appropriate Psychomotor Activity: appropriate Gait: unable to assess Thought Process: linear, logical, and goal directed  Thought Content/Perception: denies suicidal and homicidal ideation, plan, and intent and no hallucinations, delusions, bizarre thinking or behavior reported or observed Orientation: time, person, place, and purpose of appointment Memory/Concentration: memory, attention, language, and fund of knowledge intact  Insight/Judgment: good  Interventions:  Conducted a brief chart review Provided empathic reflections and validation Reviewed content from the previous session Employed supportive psychotherapy interventions to facilitate reduced distress and to improve coping skills with identified stressors Employed motivational interviewing skills to assess patient's willingness/desire to adhere to recommended medical treatments and assignments Psychoeducation provided regarding mindfulness Engaged patient in mindfulness exercise(s) Employed acceptance and commitment interventions to emphasize mindfulness and acceptance without struggle  DSM-5 Diagnosis(es): 296.32 (F33.1) Major Depressive Disorder, Recurrent Episode, Moderate and 307.59 (F50.8) Other Specified Feeding or Eating Disorder, Emotional Eating Behaviors   Treatment Goal & Progress: During the initial appointment with this provider, the following treatment goal was established: increase coping skills. Brandy [name] has demonstrated progress in her goal as evidenced by increased awareness of hunger patterns and increased awareness of triggers for emotional eating. Brandy [name] also continues to demonstrate willingness to engage in learned skill(s).  Plan: The next appointment will be scheduled in two weeks, which will be via MyChart Video Visit. The next session will focus on  working towards the established treatment goal.

## 2019-09-29 NOTE — Telephone Encounter (Signed)
Pt has returned the call to Casey,RN please call. °

## 2019-10-01 ENCOUNTER — Ambulatory Visit (INDEPENDENT_AMBULATORY_CARE_PROVIDER_SITE_OTHER): Payer: BC Managed Care – PPO | Admitting: Psychology

## 2019-10-01 ENCOUNTER — Other Ambulatory Visit (INDEPENDENT_AMBULATORY_CARE_PROVIDER_SITE_OTHER): Payer: Self-pay | Admitting: Family Medicine

## 2019-10-01 DIAGNOSIS — E559 Vitamin D deficiency, unspecified: Secondary | ICD-10-CM

## 2019-10-01 DIAGNOSIS — F3289 Other specified depressive episodes: Secondary | ICD-10-CM | POA: Diagnosis not present

## 2019-10-02 ENCOUNTER — Encounter: Payer: BC Managed Care – PPO | Attending: Cardiovascular Disease

## 2019-10-02 ENCOUNTER — Other Ambulatory Visit: Payer: Self-pay | Admitting: Obstetrics and Gynecology

## 2019-10-02 DIAGNOSIS — E1159 Type 2 diabetes mellitus with other circulatory complications: Secondary | ICD-10-CM | POA: Insufficient documentation

## 2019-10-02 DIAGNOSIS — Z713 Dietary counseling and surveillance: Secondary | ICD-10-CM | POA: Insufficient documentation

## 2019-10-02 DIAGNOSIS — I872 Venous insufficiency (chronic) (peripheral): Secondary | ICD-10-CM | POA: Insufficient documentation

## 2019-10-02 DIAGNOSIS — I509 Heart failure, unspecified: Secondary | ICD-10-CM | POA: Insufficient documentation

## 2019-10-02 DIAGNOSIS — R0600 Dyspnea, unspecified: Secondary | ICD-10-CM | POA: Insufficient documentation

## 2019-10-02 DIAGNOSIS — Z6841 Body Mass Index (BMI) 40.0 and over, adult: Secondary | ICD-10-CM | POA: Insufficient documentation

## 2019-10-03 ENCOUNTER — Encounter: Payer: Self-pay | Admitting: Family Medicine

## 2019-10-03 ENCOUNTER — Encounter (INDEPENDENT_AMBULATORY_CARE_PROVIDER_SITE_OTHER): Payer: Self-pay | Admitting: Family Medicine

## 2019-10-05 ENCOUNTER — Encounter (INDEPENDENT_AMBULATORY_CARE_PROVIDER_SITE_OTHER): Payer: Self-pay | Admitting: Family Medicine

## 2019-10-05 ENCOUNTER — Other Ambulatory Visit: Payer: Self-pay

## 2019-10-05 ENCOUNTER — Encounter: Payer: Self-pay | Admitting: *Deleted

## 2019-10-05 ENCOUNTER — Other Ambulatory Visit
Admission: RE | Admit: 2019-10-05 | Discharge: 2019-10-05 | Disposition: A | Discharge status: Home / Self Care | Payer: BC Managed Care – PPO | Source: Ambulatory Visit | Attending: Obstetrics and Gynecology | Admitting: Obstetrics and Gynecology

## 2019-10-05 DIAGNOSIS — Z20822 Contact with and (suspected) exposure to covid-19: Secondary | ICD-10-CM | POA: Diagnosis not present

## 2019-10-05 DIAGNOSIS — I5032 Chronic diastolic (congestive) heart failure: Secondary | ICD-10-CM

## 2019-10-05 DIAGNOSIS — Z01812 Encounter for preprocedural laboratory examination: Secondary | ICD-10-CM | POA: Diagnosis not present

## 2019-10-05 DIAGNOSIS — R0600 Dyspnea, unspecified: Secondary | ICD-10-CM

## 2019-10-05 DIAGNOSIS — R0609 Other forms of dyspnea: Secondary | ICD-10-CM

## 2019-10-05 LAB — SARS CORONAVIRUS 2 (TAT 6-24 HRS): SARS Coronavirus 2: NEGATIVE

## 2019-10-05 NOTE — Telephone Encounter (Signed)
I call pt and gave her results of sleep study. I stated a order will be sent to [employer] to set up the machine and mask for her. I stated she has to be seen withing 4 to 6 weeks of wearing cipap at our office. PT stated she is having another surgery and will call back to schedule appt with Amy NP once she gets the machine. She verbalized understanding.

## 2019-10-05 NOTE — Telephone Encounter (Signed)
Please r/s

## 2019-10-05 NOTE — Telephone Encounter (Signed)
Orders for cipap mask supplies sent to [employer] company today via community message.

## 2019-10-06 ENCOUNTER — Other Ambulatory Visit: Payer: Self-pay

## 2019-10-06 ENCOUNTER — Encounter (HOSPITAL_COMMUNITY): Payer: Self-pay | Admitting: Obstetrics and Gynecology

## 2019-10-06 ENCOUNTER — Encounter (INDEPENDENT_AMBULATORY_CARE_PROVIDER_SITE_OTHER): Payer: Self-pay | Admitting: Family Medicine

## 2019-10-06 ENCOUNTER — Ambulatory Visit (INDEPENDENT_AMBULATORY_CARE_PROVIDER_SITE_OTHER): Payer: BC Managed Care – PPO | Admitting: Family Medicine

## 2019-10-06 ENCOUNTER — Telehealth: Payer: Self-pay

## 2019-10-06 VITALS — BP 118/76 | HR 109 | Temp 98.0°F | Ht 66.0 in | Wt 364.0 lb

## 2019-10-06 DIAGNOSIS — E559 Vitamin D deficiency, unspecified: Secondary | ICD-10-CM

## 2019-10-06 DIAGNOSIS — Z9189 Other specified personal risk factors, not elsewhere classified: Secondary | ICD-10-CM

## 2019-10-06 DIAGNOSIS — E1169 Type 2 diabetes mellitus with other specified complication: Secondary | ICD-10-CM

## 2019-10-06 DIAGNOSIS — Z6841 Body Mass Index (BMI) 40.0 and over, adult: Secondary | ICD-10-CM

## 2019-10-06 MED ORDER — VITAMIN D (ERGOCALCIFEROL) 1.25 MG (50000 UNIT) PO CAPS: 50000.0000 [IU] | ORAL_CAPSULE | ORAL | 0 refills | Status: DC

## 2019-10-06 NOTE — Progress Notes (Signed)
Chief Complaint:   OBESITY Brandy [name] is here to discuss her progress with her obesity treatment plan along with follow-up of her obesity related diagnoses. Brandy [name] is on the Category 3 Plan and states she is following her eating plan approximately 98% of the time. Brandy [name] states she is doing pulmonary rehab for 60 minutes 2 times per week.  Today's visit was #: 4 Starting weight: 383 lbs Starting date: 08/25/2019 Today's weight: 364 lbs Today's date: 10/07/2019 Total lbs lost to date: 19 Total lbs lost since last in-office visit: 6  Interim History: Brandy [name] continues to do well with weight loss on her Category 3 plan. Her hunger is mostly controlled and she feels she does well with structure. She is getting good support at home.  Subjective:   1. Vitamin D deficiency Brandy [name] is stable on Vit D and she denies nausea, vomiting, or muscle weakness. She requests a refill today.  2. Type 2 diabetes mellitus with other specified complication, without long-term current use of insulin (HCC) Brandy [name] has not been checking her BGs much lately, but she stated her morning glucose today was 164. She is ready to start checking it again regularly.  3. At risk for hyperglycemia Brandy [name] is at increased risk for hyperglycemia due to weight loss and decreased monitoring.   Assessment/Plan:   1. Vitamin D deficiency Low Vitamin D level contributes to fatigue and are associated with obesity, breast, and colon cancer. We will refill prescription Vitamin D for 1 month. Brandy [name] will follow-up for routine testing of Vitamin D, at least 2-3 times per year to avoid over-replacement.  - Vitamin D, Ergocalciferol, (DRISDOL) 1.25 MG (50000 UNIT) CAPS capsule; Take 1 capsule (50,000 Units total) by mouth every 3 (three) days.  Dispense: 10 capsule; Refill: 0  2. Type 2 diabetes mellitus with other specified complication, without long-term current use of insulin (HCC) Good blood sugar control is important to decrease the likelihood of  diabetic complications such as nephropathy, neuropathy, limb loss, blindness, coronary artery disease, and death. Intensive lifestyle modification including diet, exercise and weight loss are the first line of treatment for diabetes. Brandy [name] will continue with diet, exercise, and weight loss efforts.  3. At risk for hyperglycemia Brandy [name] was given approximately 15 minutes of counseling today regarding prevention of hyperglycemia. She was advised of hyperglycemia causes and the fact hyperglycemia is often asymptomatic. Brandy [name] was instructed to avoid skipping meals, eat regular protein rich meals and schedule low calorie but protein rich snacks as needed.   Repetitive spaced learning was employed today to elicit superior memory formation and behavioral change  4. Class 3 severe obesity with serious comorbidity and body mass index (BMI) of 50.0 to 59.9 in adult, unspecified obesity type (HCC) Brandy [name] is currently in the action stage of change. As such, her goal is to continue with weight loss efforts. She has agreed to the Category 3 Plan.   Exercise goals: As is.  Behavioral modification strategies: celebration eating strategies.  Brandy [name] has agreed to follow-up with our clinic in 2 weeks. She was informed of the importance of frequent follow-up visits to maximize her success with intensive lifestyle modifications for her multiple health conditions.   Objective:   Blood pressure 118/76, pulse (!) 109, temperature 98 F (36.7 C), temperature source Oral, height 5\' 6"  (1.676 m), weight (!) 364 lb (165.1 kg), SpO2 96 %. Body mass index is 58.75 kg/m.  General: Cooperative, alert, well developed, in no acute distress. HEENT: Conjunctivae and lids unremarkable. Cardiovascular:  Regular rhythm.  Lungs: Normal work of breathing. Neurologic: No focal deficits.   Lab Results  Component Value Date   CREATININE 0.87 10/07/2019   BUN 26 (H) 10/07/2019   NA 131 (L) 10/07/2019   K 4.4 10/07/2019   CL 96 (L)  10/07/2019   CO2 23 10/07/2019   Lab Results  Component Value Date   ALT 12 08/25/2019   AST 12 08/25/2019   ALKPHOS 110 08/25/2019   BILITOT 0.3 08/25/2019   Lab Results  Component Value Date   HGBA1C 7.8 (H) 08/25/2019   HGBA1C 9.2 (A) 06/17/2019   HGBA1C 9.1 (A) 03/18/2019   HGBA1C 8.9 (A) 12/10/2018   HGBA1C 8.5 (H) 08/29/2018   Lab Results  Component Value Date   INSULIN 101.0 (H) 08/25/2019   Lab Results  Component Value Date   TSH 1.720 08/25/2019   Lab Results  Component Value Date   CHOL 162 08/25/2019   HDL 69 08/25/2019   LDLCALC 73 08/25/2019   LDLDIRECT 108.3 09/14/2010   TRIG 114 08/25/2019   CHOLHDL 3 03/18/2019   Lab Results  Component Value Date   WBC 12.9 (H) 10/07/2019   HGB 13.1 10/07/2019   HCT 39.5 10/07/2019   MCV 91.6 10/07/2019   PLT 399 10/07/2019   No results found for: IRON, TIBC, FERRITIN  Attestation Statements:   Reviewed by clinician on day of visit: allergies, medications, problem list, medical history, surgical history, family history, social history, and previous encounter notes.   I, Trixie Dredge, am acting as transcriptionist for Dennard Nip, MD.  I have reviewed the above documentation for accuracy and completeness, and I agree with the above. -  Dennard Nip, MD

## 2019-10-06 NOTE — Telephone Encounter (Signed)
Received disability determination forms. Forms have been forwarded to [employer] via fax. Nothing further is needed.

## 2019-10-06 NOTE — Progress Notes (Signed)
Spoke with pt for pre-op call. She denies cardiac history, her medical record has Congestive heart failure and pt says that might be what causes her shortness of breath but states she is not sure if she was ever diagnosed with it. Pt is type 2 diabetic. Pt states her A1C was 7.8 at the end of May. She states she occasionally checks her blood sugar, she did check it this AM and it was 168. Instructed pt to take 1/2 of her regular dose of Basaglar Insulin tonight, she will take 17 units. Instructed her not to eat any food after midnight, but she may have clear liquids (diet or low sugar) until 5:30 AM. Instructed pt to check her blood sugar when she gets up in the morning. If blood sugar is 70 or below, treat with 1/2 cup of clear juice (apple or cranberry) and recheck blood sugar 15 minutes after drinking juice. If blood sugar continues to be 70 or below be sure to let nurse know on arrival that blood sugar is low. She voiced understanding. Pt instructed not to take her evening dose and AM dose of Glipizide and not to take her AM dose of Metformin. Pt voiced understanding.  Covid test done 10/05/19 and it is negative. Pt states she has been in quarantine except for a doctor's appt today. She understands that she stays in quarantine until she comes to the hospital tomorrow.

## 2019-10-07 ENCOUNTER — Ambulatory Visit (HOSPITAL_COMMUNITY): Payer: BC Managed Care – PPO | Admitting: Physician Assistant

## 2019-10-07 ENCOUNTER — Encounter (HOSPITAL_COMMUNITY)
Admission: RE | Disposition: A | Discharge status: Home / Self Care | Payer: Self-pay | Source: Home / Self Care | Attending: Obstetrics and Gynecology

## 2019-10-07 ENCOUNTER — Ambulatory Visit (HOSPITAL_COMMUNITY)
Admission: RE | Admit: 2019-10-07 | Discharge: 2019-10-07 | Disposition: A | Discharge status: Home / Self Care | Payer: BC Managed Care – PPO | Attending: Obstetrics and Gynecology | Admitting: Obstetrics and Gynecology

## 2019-10-07 ENCOUNTER — Encounter (HOSPITAL_COMMUNITY): Payer: Self-pay | Admitting: Obstetrics and Gynecology

## 2019-10-07 ENCOUNTER — Ambulatory Visit: Payer: BC Managed Care – PPO | Admitting: Family Medicine

## 2019-10-07 ENCOUNTER — Ambulatory Visit (HOSPITAL_COMMUNITY): Payer: BC Managed Care – PPO

## 2019-10-07 ENCOUNTER — Ambulatory Visit (INDEPENDENT_AMBULATORY_CARE_PROVIDER_SITE_OTHER): Payer: BC Managed Care – PPO | Admitting: Family Medicine

## 2019-10-07 DIAGNOSIS — Z8249 Family history of ischemic heart disease and other diseases of the circulatory system: Secondary | ICD-10-CM | POA: Diagnosis not present

## 2019-10-07 DIAGNOSIS — Z7982 Long term (current) use of aspirin: Secondary | ICD-10-CM | POA: Diagnosis not present

## 2019-10-07 DIAGNOSIS — Z79899 Other long term (current) drug therapy: Secondary | ICD-10-CM | POA: Insufficient documentation

## 2019-10-07 DIAGNOSIS — I110 Hypertensive heart disease with heart failure: Secondary | ICD-10-CM | POA: Diagnosis not present

## 2019-10-07 DIAGNOSIS — Z01818 Encounter for other preprocedural examination: Secondary | ICD-10-CM

## 2019-10-07 DIAGNOSIS — N840 Polyp of corpus uteri: Secondary | ICD-10-CM | POA: Insufficient documentation

## 2019-10-07 DIAGNOSIS — I509 Heart failure, unspecified: Secondary | ICD-10-CM | POA: Diagnosis not present

## 2019-10-07 DIAGNOSIS — E669 Obesity, unspecified: Secondary | ICD-10-CM | POA: Insufficient documentation

## 2019-10-07 DIAGNOSIS — M797 Fibromyalgia: Secondary | ICD-10-CM | POA: Diagnosis not present

## 2019-10-07 DIAGNOSIS — Z8349 Family history of other endocrine, nutritional and metabolic diseases: Secondary | ICD-10-CM | POA: Diagnosis not present

## 2019-10-07 DIAGNOSIS — E119 Type 2 diabetes mellitus without complications: Secondary | ICD-10-CM | POA: Diagnosis not present

## 2019-10-07 DIAGNOSIS — Z833 Family history of diabetes mellitus: Secondary | ICD-10-CM | POA: Insufficient documentation

## 2019-10-07 DIAGNOSIS — M1990 Unspecified osteoarthritis, unspecified site: Secondary | ICD-10-CM | POA: Diagnosis not present

## 2019-10-07 DIAGNOSIS — N950 Postmenopausal bleeding: Secondary | ICD-10-CM

## 2019-10-07 DIAGNOSIS — Z888 Allergy status to other drugs, medicaments and biological substances status: Secondary | ICD-10-CM | POA: Diagnosis not present

## 2019-10-07 DIAGNOSIS — Z8261 Family history of arthritis: Secondary | ICD-10-CM | POA: Diagnosis not present

## 2019-10-07 DIAGNOSIS — F329 Major depressive disorder, single episode, unspecified: Secondary | ICD-10-CM | POA: Insufficient documentation

## 2019-10-07 DIAGNOSIS — Z880 Allergy status to penicillin: Secondary | ICD-10-CM | POA: Insufficient documentation

## 2019-10-07 DIAGNOSIS — E781 Pure hyperglyceridemia: Secondary | ICD-10-CM | POA: Insufficient documentation

## 2019-10-07 DIAGNOSIS — G4730 Sleep apnea, unspecified: Secondary | ICD-10-CM | POA: Insufficient documentation

## 2019-10-07 DIAGNOSIS — Z794 Long term (current) use of insulin: Secondary | ICD-10-CM | POA: Diagnosis not present

## 2019-10-07 DIAGNOSIS — I10 Essential (primary) hypertension: Secondary | ICD-10-CM | POA: Diagnosis not present

## 2019-10-07 DIAGNOSIS — Z9889 Other specified postprocedural states: Secondary | ICD-10-CM

## 2019-10-07 DIAGNOSIS — Z791 Long term (current) use of non-steroidal anti-inflammatories (NSAID): Secondary | ICD-10-CM | POA: Insufficient documentation

## 2019-10-07 DIAGNOSIS — F419 Anxiety disorder, unspecified: Secondary | ICD-10-CM | POA: Diagnosis not present

## 2019-10-07 DIAGNOSIS — Z6841 Body Mass Index (BMI) 40.0 and over, adult: Secondary | ICD-10-CM | POA: Diagnosis not present

## 2019-10-07 DIAGNOSIS — E559 Vitamin D deficiency, unspecified: Secondary | ICD-10-CM | POA: Diagnosis not present

## 2019-10-07 HISTORY — DX: Other specified postprocedural states: R11.2

## 2019-10-07 HISTORY — DX: Other specified postprocedural states: Z98.890

## 2019-10-07 HISTORY — DX: Sleep apnea, unspecified: G47.30

## 2019-10-07 HISTORY — PX: DILATATION & CURETTAGE/HYSTEROSCOPY WITH MYOSURE: SHX6511

## 2019-10-07 LAB — CBC
HCT: 39.5 % (ref 36.0–46.0)
Hemoglobin: 13.1 g/dL (ref 12.0–15.0)
MCH: 30.4 pg (ref 26.0–34.0)
MCHC: 33.2 g/dL (ref 30.0–36.0)
MCV: 91.6 fL (ref 80.0–100.0)
Platelets: 399 10*3/uL (ref 150–400)
RBC: 4.31 MIL/uL (ref 3.87–5.11)
RDW: 12.9 % (ref 11.5–15.5)
WBC: 12.9 10*3/uL — ABNORMAL HIGH (ref 4.0–10.5)
nRBC: 0 % (ref 0.0–0.2)

## 2019-10-07 LAB — GLUCOSE, CAPILLARY
Glucose-Capillary: 185 mg/dL — ABNORMAL HIGH (ref 70–99)
Glucose-Capillary: 147 mg/dL — ABNORMAL HIGH (ref 70–99)

## 2019-10-07 LAB — BASIC METABOLIC PANEL
Anion gap: 12 (ref 5–15)
BUN: 26 mg/dL — ABNORMAL HIGH (ref 6–20)
CO2: 23 mmol/L (ref 22–32)
Calcium: 9.2 mg/dL (ref 8.9–10.3)
Chloride: 96 mmol/L — ABNORMAL LOW (ref 98–111)
Creatinine, Ser: 0.87 mg/dL (ref 0.44–1.00)
GFR calc Af Amer: >60 mL/min (ref >60)
GFR calc non Af Amer: >60 mL/min (ref >60)
Glucose, Bld: 185 mg/dL — ABNORMAL HIGH (ref 70–99)
Potassium: 4.4 mmol/L (ref 3.5–5.1)
Sodium: 131 mmol/L — ABNORMAL LOW (ref 135–145)

## 2019-10-07 SURGERY — DILATATION & CURETTAGE/HYSTEROSCOPY WITH MYOSURE
Anesthesia: General | Site: Vagina

## 2019-10-07 MED ORDER — DEXAMETHASONE SODIUM PHOSPHATE 10 MG/ML IJ SOLN
INTRAMUSCULAR | Status: AC
  Filled 2019-10-07: qty 1

## 2019-10-07 MED ORDER — DEXAMETHASONE SODIUM PHOSPHATE 10 MG/ML IJ SOLN
INTRAMUSCULAR | Status: DC | PRN
  Administered 2019-10-07: 10 mg via INTRAVENOUS

## 2019-10-07 MED ORDER — OXYCODONE HCL 5 MG/5ML PO SOLN: 5.0000 mg | Freq: Once | ORAL | Status: AC | PRN

## 2019-10-07 MED ORDER — OXYCODONE HCL 5 MG PO TABS
5.0000 mg | ORAL_TABLET | Freq: Once | ORAL | Status: AC | PRN
  Administered 2019-10-07: 5 mg via ORAL

## 2019-10-07 MED ORDER — FENTANYL CITRATE (PF) 250 MCG/5ML IJ SOLN
INTRAMUSCULAR | Status: AC
  Filled 2019-10-07: qty 5

## 2019-10-07 MED ORDER — EPHEDRINE 5 MG/ML INJ
INTRAVENOUS | Status: AC
  Filled 2019-10-07: qty 10

## 2019-10-07 MED ORDER — PHENYLEPHRINE 40 MCG/ML (10ML) SYRINGE FOR IV PUSH (FOR BLOOD PRESSURE SUPPORT)
PREFILLED_SYRINGE | INTRAVENOUS | Status: AC
  Filled 2019-10-07: qty 10

## 2019-10-07 MED ORDER — LACTATED RINGERS IV SOLN: INTRAVENOUS | Status: DC

## 2019-10-07 MED ORDER — ONDANSETRON HCL 4 MG/2ML IJ SOLN
INTRAMUSCULAR | Status: AC
  Filled 2019-10-07: qty 2

## 2019-10-07 MED ORDER — PHENYLEPHRINE HCL (PRESSORS) 10 MG/ML IV SOLN
INTRAVENOUS | Status: DC | PRN
  Administered 2019-10-07 (×5): 80 ug via INTRAVENOUS

## 2019-10-07 MED ORDER — SILVER NITRATE-POT NITRATE 75-25 % EX MISC
CUTANEOUS | Status: AC
  Filled 2019-10-07: qty 10

## 2019-10-07 MED ORDER — ALBUMIN HUMAN 5 % IV SOLN
12.5000 g | Freq: Once | INTRAVENOUS | Status: AC
  Administered 2019-10-07: 12.5 g via INTRAVENOUS

## 2019-10-07 MED ORDER — ALBUMIN HUMAN 5 % IV SOLN
INTRAVENOUS | Status: AC
  Filled 2019-10-07: qty 250

## 2019-10-07 MED ORDER — ONDANSETRON HCL 4 MG/2ML IJ SOLN: 4.0000 mg | Freq: Once | INTRAMUSCULAR | Status: DC | PRN

## 2019-10-07 MED ORDER — SUCCINYLCHOLINE CHLORIDE 20 MG/ML IJ SOLN
INTRAMUSCULAR | Status: DC | PRN
  Administered 2019-10-07: 160 mg via INTRAVENOUS

## 2019-10-07 MED ORDER — LIDOCAINE HCL (PF) 1 % IJ SOLN
INTRAMUSCULAR | Status: AC
  Filled 2019-10-07: qty 30

## 2019-10-07 MED ORDER — CHLORHEXIDINE GLUCONATE 0.12 % MT SOLN
15.0000 mL | Freq: Once | OROMUCOSAL | Status: AC
  Administered 2019-10-07: 15 mL via OROMUCOSAL
  Filled 2019-10-07: qty 15

## 2019-10-07 MED ORDER — OXYCODONE HCL 5 MG PO TABS
ORAL_TABLET | ORAL | Status: AC
  Filled 2019-10-07: qty 1

## 2019-10-07 MED ORDER — EPHEDRINE SULFATE 50 MG/ML IJ SOLN
INTRAMUSCULAR | Status: DC | PRN
  Administered 2019-10-07: 5 mg via INTRAVENOUS
  Administered 2019-10-07 (×2): 10 mg via INTRAVENOUS

## 2019-10-07 MED ORDER — MIDAZOLAM HCL 5 MG/5ML IJ SOLN
INTRAMUSCULAR | Status: DC | PRN
  Administered 2019-10-07: 2 mg via INTRAVENOUS

## 2019-10-07 MED ORDER — FENTANYL CITRATE (PF) 100 MCG/2ML IJ SOLN
INTRAMUSCULAR | Status: DC | PRN
  Administered 2019-10-07: 150 ug via INTRAVENOUS

## 2019-10-07 MED ORDER — ONDANSETRON HCL 4 MG/2ML IJ SOLN
INTRAMUSCULAR | Status: DC | PRN
  Administered 2019-10-07: 4 mg via INTRAVENOUS

## 2019-10-07 MED ORDER — PROPOFOL 10 MG/ML IV BOLUS
INTRAVENOUS | Status: DC | PRN
  Administered 2019-10-07: 200 mg via INTRAVENOUS

## 2019-10-07 MED ORDER — LIDOCAINE HCL (CARDIAC) PF 100 MG/5ML IV SOSY
PREFILLED_SYRINGE | INTRAVENOUS | Status: DC | PRN
  Administered 2019-10-07: 40 mg via INTRAVENOUS

## 2019-10-07 MED ORDER — MIDAZOLAM HCL 2 MG/2ML IJ SOLN
INTRAMUSCULAR | Status: AC
  Filled 2019-10-07: qty 2

## 2019-10-07 MED ORDER — SODIUM CHLORIDE 0.9 % IR SOLN
Status: DC | PRN
  Administered 2019-10-07: 3000 mL

## 2019-10-07 MED ORDER — PROPOFOL 10 MG/ML IV BOLUS
INTRAVENOUS | Status: AC
  Filled 2019-10-07: qty 20

## 2019-10-07 MED ORDER — LIDOCAINE HCL 1 % IJ SOLN
INTRAMUSCULAR | Status: DC | PRN
  Administered 2019-10-07: 20 mL

## 2019-10-07 MED ORDER — LIDOCAINE 2% (20 MG/ML) 5 ML SYRINGE
INTRAMUSCULAR | Status: AC
  Filled 2019-10-07: qty 5

## 2019-10-07 MED ORDER — FENTANYL CITRATE (PF) 100 MCG/2ML IJ SOLN: 25.0000 ug | INTRAMUSCULAR | Status: DC | PRN

## 2019-10-07 MED ORDER — ORAL CARE MOUTH RINSE: 15.0000 mL | Freq: Once | OROMUCOSAL | Status: AC

## 2019-10-07 SURGICAL SUPPLY — 18 items
CATH ROBINSON RED A/P 16FR (CATHETERS) ×3 IMPLANT
DEVICE MYOSURE REACH (MISCELLANEOUS) ×2 IMPLANT
GAUZE 4X4 16PLY RFD (DISPOSABLE) ×3 IMPLANT
GLOVE BIOGEL PI IND STRL 7.0 (GLOVE) ×2 IMPLANT
GLOVE BIOGEL PI IND STRL 7.5 (GLOVE) ×2 IMPLANT
GLOVE BIOGEL PI INDICATOR 7.0 (GLOVE) ×1
GLOVE BIOGEL PI INDICATOR 7.5 (GLOVE) ×1
GLOVE SURG SS PI 7.0 STRL IVOR (GLOVE) ×3 IMPLANT
GOWN STRL REUS W/ TWL LRG LVL3 (GOWN DISPOSABLE) ×2 IMPLANT
GOWN STRL REUS W/ TWL XL LVL3 (GOWN DISPOSABLE) ×2 IMPLANT
GOWN STRL REUS W/TWL LRG LVL3 (GOWN DISPOSABLE) ×3
GOWN STRL REUS W/TWL XL LVL3 (GOWN DISPOSABLE) ×3
KIT PROCEDURE FLUENT (KITS) ×3 IMPLANT
PACK VAGINAL MINOR WOMEN LF (CUSTOM PROCEDURE TRAY) ×3 IMPLANT
PAD OB MATERNITY 4.3X12.25 (PERSONAL CARE ITEMS) ×3 IMPLANT
SEAL ROD LENS SCOPE MYOSURE (ABLATOR) ×2 IMPLANT
SLEEVE SCD COMPRESS KNEE XLRG (MISCELLANEOUS) ×2 IMPLANT
TOWEL GREEN STERILE FF (TOWEL DISPOSABLE) ×6 IMPLANT

## 2019-10-07 NOTE — Transfer of Care (Signed)
Immediate Anesthesia Transfer of Care Note  Patient: Brandy [name]  Procedure(s) Performed: DILATATION AND CURETTAGE /HYSTEROSCOPY, Myosure, Polypectomy (N/A Vagina )  Patient Location: PACU  Anesthesia Type:General  Level of Consciousness: drowsy  Airway & Oxygen Therapy: Patient Spontanous Breathing and Patient connected to face mask oxygen  Post-op Assessment: Report given to RN, Post -op Vital signs reviewed and stable and Patient moving all extremities  Post vital signs: Reviewed and stable  Last Vitals:  Vitals Value Taken Time  BP 127/82 10/07/19 0935  Temp 36.6 C 10/07/19 0935  Pulse 86 10/07/19 0937  Resp 22 10/07/19 0937  SpO2 100 % 10/07/19 0937  Vitals shown include unvalidated device data.  Last Pain:  Vitals:   10/07/19 0729  TempSrc:   PainSc: 0-No pain         Complications: No apparent anesthesia complications

## 2019-10-07 NOTE — Anesthesia Preprocedure Evaluation (Signed)
Anesthesia Evaluation  Patient identified by MRN, date of birth, ID band Patient awake    Reviewed: Allergy & Precautions, NPO status , Patient's Chart, lab work & pertinent test results  Airway Mallampati: III  TM Distance: >3 FB Neck ROM: Full    Dental  (+) Teeth Intact, Dental Advisory Given, Loose,    Pulmonary    breath sounds clear to auscultation       Cardiovascular hypertension,  Rhythm:Regular Rate:Normal     Neuro/Psych    GI/Hepatic   Endo/Other  diabetes  Renal/GU      Musculoskeletal   Abdominal (+) + obese,   Peds  Hematology   Anesthesia Other Findings   Reproductive/Obstetrics                             Anesthesia Physical Anesthesia Plan  ASA: III  Anesthesia Plan: General   Post-op Pain Management:    Induction: Intravenous  PONV Risk Score and Plan: Ondansetron and Propofol infusion  Airway Management Planned: Oral ETT  Additional Equipment:   Intra-op Plan:   Post-operative Plan: Extubation in OR  Informed Consent: I have reviewed the patients History and Physical, chart, labs and discussed the procedure including the risks, benefits and alternatives for the proposed anesthesia with the patient or authorized representative who has indicated his/her understanding and acceptance.     Dental advisory given  Plan Discussed with: CRNA and Anesthesiologist  Anesthesia Plan Comments:         Anesthesia Quick Evaluation

## 2019-10-07 NOTE — Anesthesia Postprocedure Evaluation (Signed)
Anesthesia Post Note  Patient: Brandy [name]  Procedure(s) Performed: DILATATION AND CURETTAGE /HYSTEROSCOPY, Myosure, Polypectomy (N/A Vagina )     Patient location during evaluation: PACU Anesthesia Type: General Level of consciousness: awake and alert Pain management: pain level controlled Vital Signs Assessment: post-procedure vital signs reviewed and stable Respiratory status: spontaneous breathing, nonlabored ventilation, respiratory function stable and patient connected to nasal cannula oxygen Cardiovascular status: blood pressure returned to baseline and stable Postop Assessment: no apparent nausea or vomiting Anesthetic complications: no    Last Vitals:  Vitals:   10/07/19 1035 10/07/19 1044  BP: 112/63 121/71  Pulse: 80   Resp: 20   Temp: 36.7 C   SpO2: 94%     Last Pain:  Vitals:   10/07/19 1035  TempSrc:   PainSc: 2                  [name] Halleck COKER

## 2019-10-07 NOTE — Discharge Instructions (Addendum)
   We will discuss your surgery once again in detail at your post-op visit in two to four weeks. If you haven't already done so, please call to make your appointment as soon as possible.  Dilation and Curettage Care After These instructions give you information on caring for yourself after your procedure. Your doctor may also give you more specific instructions. Call your doctor if you have any problems or questions after your procedure. HOME CARE Do not drive for 24 hours. Wait 1 week before doing any activities that wear you out. Do not stand for a long time. Limit stair climbing to once or twice a day. Rest often. Continue with your usual diet. Drink enough fluids to keep your pee (urine) clear or pale yellow. If you have a hard time pooping (constipation), you may: Take a medicine to help you go poop (laxative) as told by your doctor. Eat more fruit and bran. Drink more fluids. Take showers, not baths, for as long as told by your doctor. Do not swim or use a hot tub until your doctor says it is okay. Have someone with you for 1day after the procedure. Do not douche, use tampons, or have sex (intercourse) until seen by your doctor Only take medicines as told by your doctor. Do not take aspirin. It can cause bleeding. Keep all doctor visits. GET HELP IF: You have cramps or pain not helped by medicine. You have new pain in the belly (abdomen). You have a bad smelling fluid coming from your vagina. You have a rash. You have problems with any medicine. GET HELP RIGHT AWAY IF:  You start to bleed more than a regular period. You have a fever. You have chest pain. You have trouble breathing. You feel dizzy or feel like passing out (fainting). You pass out. You have pain in the tops of your shoulders. You have vaginal bleeding with or without clumps of blood (blood clots). MAKE SURE YOU: Understand these instructions. Will watch your condition. Will get help right away if you are  not doing well or get worse. Document Released: 01/24/2008 Document Revised: 04/21/2013 Document Reviewed: 11/13/2012 ExitCare Patient Information 2015 [employer]. This information is not intended to replace advice given to you by your health care provider. Make sure you discuss any questions you have with your health care provider.   

## 2019-10-07 NOTE — H&P (Signed)
Obstetrics & Gynecology Surgical H&P   Date of Surgery: 10/07/2019    Primary OBGYN: [facility]  Reason for Admission: scheduled hysteroscopy, d&c  History of Present Illness: [name] is a 54 y.o. 3 (No LMP recorded (lmp unknown). Patient is postmenopausal.), with the above CC.  Patient initially seen by me march 2021 for PMB and had a normal embx (it did show proliferative cells) and pap with me but an u/s that showed normal 15mm but patient had persistent pain and bleeding. Given her risk factors for uterine cancer and s/s, plan was made for hysteroscopy, d&c  Persistent low belly cramping, no bleeding since last visit.   ROS: A 12-point review of systems was performed and negative, except as stated in the above HPI.  OBGYN History: As per HPI. OB History  Gravida Para Term Preterm AB Living  3 3 3         SAB TAB Ectopic Multiple Live Births          3    # Outcome Date GA Lbr Len/2nd Weight Sex Delivery Anes PTL Lv  3 Term           2 Term           1 Term             Obstetric Comments  Vaginal delivery x 3   Patient had two periods in February (1st about a week, somewhat heavy and mildly crampy) and 2nd one that was a little shorter and less intense in late February into March; she is not having any current VB. She last had a period in January 2020 and then November 2019 and January 2019. No h/o HRT.    Past Medical History: Past Medical History:  Diagnosis Date  . Anxiety   . Arthritis   . Back pain   . Brain fog   . CHF (congestive heart failure) (HCC)    pt not aware of this  . Depression   . Diabetes mellitus without complication (HCC)   . Difficulty walking   . Dysmetabolic syndrome X   . Dyspnea   . Fatigue   . Fibromyalgia   . Hypertension   . Hypertriglyceridemia   . Joint pain   . Leg weakness   . Lower extremity edema   . Muscle atrophy   . Neuropathy   . Obesity   . Osteoarthritis   . PONV (postoperative  nausea and vomiting)   . Poor memory   . Post-menopausal bleeding   . Prediabetes   . Rosacea   . Sleep apnea    not on cpap yet  . Tetanus vaccine causing adverse effect in therapeutic use   . Vitamin D deficiency     Past Surgical History: Past Surgical History:  Procedure Laterality Date  . 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 ENDOMETRIAL BIOPSY  07/14/2019      . epidural steroid injection      Family History:  Family History  Problem Relation Age of Onset  . Rheum arthritis Mother   . COPD Mother   . Stroke Mother   . Depression Mother   . Anxiety disorder Mother   . Eating disorder Mother   . Obesity Mother   . Hypertension Father   . Depression Father   . Alcoholism Father   . Heart disease Maternal Grandmother   . Stroke Maternal Grandmother   . Alcohol abuse Maternal Grandmother   .  Alcohol abuse Maternal Grandfather   . Aneurysm Paternal Grandmother   . Autism Son        severe  . Diabetes Paternal Aunt   . Breast cancer Neg Hx     Social History:  Social History   Socioeconomic History  . Marital status: Married    Spouse name: Brandy [name]  . Number of children: 3  . Years of education: Not on file  . Highest education level: Not on file  Occupational History  . Occupation: Immunologist: [employer]  Tobacco Use  . Smoking status: Never Smoker  . Smokeless tobacco: Never Used  Substance and Sexual Activity  . Alcohol use: Yes    Comment: rarely - once a year  . Drug use: No  . Sexual activity: Not on file  Other Topics Concern  . Not on file  Social History Narrative   Best boy      Married; 3 kids (middle child-severely autistic)      No regular exercise   Social Determinants of Health   Financial Resource Strain:   . Difficulty of Paying Living Expenses:   Food Insecurity:   . Worried About Programme researcher, broadcasting/film/video in the Last Year:   . Barista in the Last Year:    Transportation Needs:   . Freight forwarder (Medical):   Marland Kitchen Lack of Transportation (Non-Medical):   Physical Activity:   . Days of Exercise per Week:   . Minutes of Exercise per Session:   Stress:   . Feeling of Stress :   Social Connections:   . Frequency of Communication with Friends and Family:   . Frequency of Social Gatherings with Friends and Family:   . Attends Religious Services:   . Active Member of Clubs or Organizations:   . Attends Banker Meetings:   Marland Kitchen Marital Status:   Intimate Partner Violence:   . Fear of Current or Ex-Partner:   . Emotionally Abused:   Marland Kitchen Physically Abused:   . Sexually Abused:      Allergy: Allergies  Allergen Reactions  . Diphenhydramine Hcl Rash    minor rash  . Penicillins Rash    Current Outpatient Medications: Medications Prior to Admission  Medication Sig Dispense Refill Last Dose  . acetaminophen (TYLENOL) 650 MG CR tablet Take 1,950 mg by mouth every 8 (eight) hours as needed for pain.    Past Week at Unknown time  . ARIPiprazole (ABILIFY) 5 MG tablet Take 5 mg by mouth daily.    10/07/2019 at 0535  . aspirin EC 81 MG tablet Take 81 mg by mouth daily.   Past Week at Unknown time  . atorvastatin (LIPITOR) 10 MG tablet TAKE 1 TABLET (10 MG TOTAL) BY MOUTH DAILY. FOR CHOLESTEROL. 90 tablet 0 Past Week at Unknown time  . celecoxib (CELEBREX) 100 MG capsule TAKE 1 CAPSULE (100 MG TOTAL) BY MOUTH 2 (TWO) TIMES DAILY. AS NEEDED FOR PAIN. (Patient taking differently: Take 100 mg by mouth 2 (two) times daily. ) 180 capsule 1 10/06/2019 at Unknown time  . cetirizine (ZYRTEC) 10 MG tablet TAKE 1 TABLET BY MOUTH EVERY DAY (Patient taking differently: Take 10 mg by mouth daily. ) 90 tablet 0 Past Week at Unknown time  . cholecalciferol (VITAMIN D3) 25 MCG (1000 UT) tablet Take 1,000 Units by mouth 4 (four) times a week. Take on the days your not taking 86578 unit vitamin d  Past Week at Unknown time  . citalopram (CELEXA) 20 MG  tablet Take 40 mg by mouth daily.    10/07/2019 at 0535  . Continuous Blood Gluc Receiver (DEXCOM G6 RECEIVER) DEVI Inject 1 Device into the skin daily. 4 each 1 10/06/2019 at Unknown time  . Continuous Blood Gluc Sensor (DEXCOM G6 SENSOR) MISC Inject 1 Device into the skin daily. 3 each 1 10/06/2019 at Unknown time  . Continuous Blood Gluc Transmit (DEXCOM G6 TRANSMITTER) MISC Inject 1 Device into the skin daily. 4 each 1 10/06/2019 at Unknown time  . DULoxetine (CYMBALTA) 30 MG capsule Take 30 mg by mouth daily. Take with 60 mg to equal 90 mg daily   10/07/2019 at 0535  . DULoxetine (CYMBALTA) 60 MG capsule Take 60 mg by mouth daily. Take with 30 mg to equal 90 mg daily   10/07/2019 at 0535  . folic acid (FOLVITE) 1 MG tablet Take 1 mg by mouth daily.   Past Week at Unknown time  . gabapentin (NEURONTIN) 300 MG capsule Take 1 capsule (300 mg total) by mouth 3 (three) times daily. For back pain. (Patient taking differently: Take 300 mg by mouth See admin instructions. Take 300 mg 3 times daily, may take a 4th 300 mg dose as needed for pain) 270 capsule 3 10/07/2019 at 0535  . glipiZIDE (GLUCOTROL) 10 MG tablet TAKE 1 TABLET (10 MG TOTAL) BY MOUTH 2 (TWO) TIMES DAILY BEFORE A MEAL. FOR DIABETES. 180 tablet 3 10/06/2019 at Unknown time  . hydrocortisone 2.5 % cream Apply 1 application topically 2 (two) times daily.    Past Month at Unknown time  . Insulin Glargine (BASAGLAR KWIKPEN) 100 UNIT/ML SOPN Inject 0.35 mLs (35 Units total) into the skin at bedtime. 15 mL 2 Past Week at Unknown time  . Insulin Pen Needle (PEN NEEDLES) 31G X 6 MM MISC Use nightly with insulin. 100 each 2 Past Week at Unknown time  . lisinopril-hydrochlorothiazide (ZESTORETIC) 20-12.5 MG tablet TAKE 1 TABLET BY MOUTH DAILY FOR BLOOD PRESSURE 90 tablet 1 10/06/2019 at Unknown time  . metFORMIN (GLUCOPHAGE-XR) 500 MG 24 hr tablet TAKE 2 TABLETS (1,000 MG TOTAL) BY MOUTH DAILY WITH BREAKFAST. FOR DIABETES. 180 tablet 1 10/06/2019 at Unknown time  .  metroNIDAZOLE (METROCREAM) 0.75 % cream Apply 1 application topically daily.   Past Month at Unknown time  . traZODone (DESYREL) 50 MG tablet Take 25-50 mg by mouth at bedtime as needed for sleep.    More than a month at Unknown time  . Vitamin D, Ergocalciferol, (DRISDOL) 1.25 MG (50000 UNIT) CAPS capsule Take 1 capsule (50,000 Units total) by mouth every 3 (three) days. 10 capsule 0 More than a month at Unknown time     Hospital Medications: Current Facility-Administered Medications  Medication Dose Route Frequency Provider Last Rate Last Admin  . lactated ringers infusion   Intravenous Continuous Aletha Halim, MD         Physical Exam:  Current Vital Signs 24h Vital Sign Ranges  T 98.2 F (36.8 C) Temp  Avg: 98.1 F (36.7 C)  Min: 98 F (36.7 C)  Max: 98.2 F (36.8 C)  BP 132/71 BP  Min: 118/76  Max: 132/71  HR 84 Pulse  Avg: 96.5  Min: 84  Max: 109  RR 18 Resp  Avg: 18  Min: 18  Max: 18  SaO2 98 % Room Air SpO2  Avg: 97 %  Min: 96 %  Max: 98 %  24 Hour I/O Current Shift I/O  Time Ins Outs No intake/output data recorded. No intake/output data recorded.    Body mass index is 58.59 kg/m. General appearance: Well nourished, well developed female in no acute distress.  Cardiovascular: S1, S2 normal, no murmur, rub or gallop, regular rate and rhythm Respiratory:  Clear to auscultation bilateral. Normal respiratory effort Abdomen: positive bowel sounds and no masses, hernias; diffusely non tender to palpation, non distended Neuro/Psych:  Normal mood and affect.  Skin:  Warm and dry.  Extremities: no clubbing, cyanosis, or edema.   Laboratory: COVID: negative Recent Labs  Lab 10/07/19 0658  WBC 12.9*  HGB 13.1  HCT 39.5  PLT 399   Recent Labs  Lab 10/07/19 0658  NA 131*  K 4.4  CL 96*  CO2 23  BUN 26*  CREATININE 0.87  CALCIUM 9.2  GLUCOSE 185*   Imaging:  CXR-negaive  Assessment: Ms. Kaylor is a 54 y.o. G3P3000 (No LMP recorded (lmp unknown).  Patient is postmenopausal.) here for scheduled surgery and doing well  Plan: D/w her and pt is amenable with proceeding with surgery. Can start when OR is ready.    Brandy Copa MD Attending [facility] (Faculty Practice) [phone]

## 2019-10-07 NOTE — Op Note (Signed)
Operative Note   10/07/2019  PRE-OP DIAGNOSIS: Persistent postmenopausal bleeding   POST-OP DIAGNOSIS: Same. Endometrial polyps   SURGEON: Surgeon(s) and Role:    * Louin Bing, MD - Primary  ASSISTANT: None  PROCEDURE: Hysteroscopy, Myosure polypectomy, dilation and curettage  ANESTHESIA: General and paracervical block  ESTIMATED BLOOD LOSS: 77mL  DRAINS: per anesthesia note   TOTAL IV FLUIDS: per anesthesia note  SPECIMENS: endometrial polyps and endometrial curettings  VTE PROPHYLAXIS: SCDs to the bilateral lower extremities  ANTIBIOTICS: Not indicated  FLUID DEFICIT:  COMPLICATIONS: None  DISPOSITION: PACU - hemodynamically stable.  CONDITION: stable  FINDINGS: Exam under anesthesia revealed small, mobile, mid plane uterus with no masses and bilateral adnexa without masses or fullness. Hysteroscopy medium sized polyp at the right fundal area and smaller polyp on the right sidewall just past the internal os; both appeared benign. Otherwise, uterine cavity appeared grossly normal, atrophic appearing with bilateral tubal ostia and normal appearing endocervical canal.  PROCEDURE IN DETAIL:  After informed consent was obtained, the patient was taken to the operating room where anesthesia was obtained without difficulty. The patient was positioned in the dorsal lithotomy position in [place] stirrups.  The patient's bladder was catheterized with an in and out foley catheter.  The patient was examined under anesthesia, with the above noted findings.  The bi-valved speculum was placed inside the patient's vagina, and the the anterior lip of the cervix was seen and grasped with the tenaculum.  A paracervical block was achieved with 76mL 1% lidocaine.  The uterine cavity was sounded to 8.5cm, and then the cervix was progressively dilated to a 19 French-Pratt dilator.  The hysteroscope was introduced, with the above noted findings. The hystersocope was removed and exchanged for  the Myosure scope and the re-introduced polyps removed. The hysteroscope was removed and the uterine cavity was curetted until a gritty texture was noted, yielding scant endometrial curettings.  Excellent hemostasis was noted, and all instruments were removed, with excellent hemostasis noted throughout.  She was then taken out of dorsal lithotomy. The patient tolerated the procedure well.  Sponge, lap and instrument counts were correct x2.  The patient was taken to recovery room in excellent condition.  Cornelia Copa MD [phone] Attending Center for [facility])

## 2019-10-07 NOTE — Anesthesia Procedure Notes (Signed)
Procedure Name: Intubation Date/Time: 10/07/2019 8:44 AM Performed by: Lorraina Spring T, CRNA Pre-anesthesia Checklist: Patient identified, Emergency Drugs available, Suction available and Patient being monitored Patient Re-evaluated:Patient Re-evaluated prior to induction Oxygen Delivery Method: Circle system utilized Preoxygenation: Pre-oxygenation with 100% oxygen Induction Type: IV induction Ventilation: Mask ventilation without difficulty Laryngoscope Size: Glidescope and 4 Grade View: Grade I Tube type: Oral Tube size: 7.5 mm Number of attempts: 1 Airway Equipment and Method: Stylet,  Oral airway and Video-laryngoscopy Placement Confirmation: ETT inserted through vocal cords under direct vision,  positive ETCO2 and breath sounds checked- equal and bilateral Secured at: 22 cm Tube secured with: Tape Dental Injury: Teeth and Oropharynx as per pre-operative assessment

## 2019-10-08 ENCOUNTER — Encounter (HOSPITAL_COMMUNITY): Payer: Self-pay | Admitting: Obstetrics and Gynecology

## 2019-10-08 ENCOUNTER — Ambulatory Visit (INDEPENDENT_AMBULATORY_CARE_PROVIDER_SITE_OTHER): Payer: BC Managed Care – PPO | Admitting: Psychology

## 2019-10-08 DIAGNOSIS — F3289 Other specified depressive episodes: Secondary | ICD-10-CM | POA: Diagnosis not present

## 2019-10-08 LAB — SURGICAL PATHOLOGY

## 2019-10-09 DIAGNOSIS — F331 Major depressive disorder, recurrent, moderate: Secondary | ICD-10-CM | POA: Diagnosis not present

## 2019-10-09 DIAGNOSIS — F411 Generalized anxiety disorder: Secondary | ICD-10-CM | POA: Diagnosis not present

## 2019-10-09 DIAGNOSIS — F5105 Insomnia due to other mental disorder: Secondary | ICD-10-CM | POA: Diagnosis not present

## 2019-10-12 ENCOUNTER — Encounter: Payer: BC Managed Care – PPO | Admitting: *Deleted

## 2019-10-12 ENCOUNTER — Other Ambulatory Visit: Payer: Self-pay

## 2019-10-12 ENCOUNTER — Telehealth (INDEPENDENT_AMBULATORY_CARE_PROVIDER_SITE_OTHER): Payer: BC Managed Care – PPO | Admitting: Psychology

## 2019-10-12 DIAGNOSIS — I5032 Chronic diastolic (congestive) heart failure: Secondary | ICD-10-CM

## 2019-10-12 DIAGNOSIS — E1159 Type 2 diabetes mellitus with other circulatory complications: Secondary | ICD-10-CM | POA: Diagnosis not present

## 2019-10-12 DIAGNOSIS — Z713 Dietary counseling and surveillance: Secondary | ICD-10-CM | POA: Diagnosis not present

## 2019-10-12 DIAGNOSIS — F331 Major depressive disorder, recurrent, moderate: Secondary | ICD-10-CM | POA: Diagnosis not present

## 2019-10-12 DIAGNOSIS — R0600 Dyspnea, unspecified: Secondary | ICD-10-CM

## 2019-10-12 DIAGNOSIS — I509 Heart failure, unspecified: Secondary | ICD-10-CM | POA: Diagnosis not present

## 2019-10-12 DIAGNOSIS — Z6841 Body Mass Index (BMI) 40.0 and over, adult: Secondary | ICD-10-CM | POA: Diagnosis not present

## 2019-10-12 DIAGNOSIS — F5089 Other specified eating disorder: Secondary | ICD-10-CM | POA: Diagnosis not present

## 2019-10-12 DIAGNOSIS — R0609 Other forms of dyspnea: Secondary | ICD-10-CM

## 2019-10-12 DIAGNOSIS — I872 Venous insufficiency (chronic) (peripheral): Secondary | ICD-10-CM | POA: Diagnosis not present

## 2019-10-12 NOTE — Progress Notes (Signed)
Office: [phone]  /  Fax: [phone]    Date: October 26, 2019   Appointment Start Time: 4:32pm Duration: 25 minutes Provider: Glennie Isle, Psy.D. Type of Session: Individual Therapy  Location of Patient: Parked in car at home Location of Provider: [facility] Type of Contact: Telepsychological Visit via MyChart Video Visit  Session Content: Brandy [name] is a 54 y.o. female presenting via [place] Visit for a follow-up appointment to address the previously established treatment goal of increasing coping skills. Today's appointment was a telepsychological visit due to COVID-19. Brandy [name] provided verbal consent for today's telepsychological appointment and she is aware she is responsible for securing confidentiality on her end of the session. Prior to proceeding with today's appointment, Brandy [name]'s physical location at the time of this appointment was obtained as well a phone number she could be reached at in the event of technical difficulties. Brandy [name] and this provider participated in today's telepsychological service. Of note, this provider called Brandy [name] at 4:42pm due to lost connection. Brandy [name] provided verbal consent to proceeded via a regular telephone call.   This provider conducted a brief check-in. Brandy [name] discussed recent cravings; however, discussed it has been "pretty easy" for her to stay on task. She described feeling more in control. Session focused further on mindfulness to assist with coping. She stated she tries to "stay mindful about everything." Psychoeducation regarding formal (e.g., setting aside a specific time daily to engage in an exercise) and informal (e.g., cultivating awareness in the present moment and taking a non-judgmental approach while engaging in day-to-day tasks) mindfulness was provided. Additionally, Brandy [name] was led through a mindfulness exercise (A Taste of Mindfulness) and her experience was processed. Brandy [name] provided verbal consent during today's appointment  for this provider to send the handout for today's exercise via e-mail. This also provider discussed the utilization of YouTube for mindfulness exercises (e.g., exercises by Merri Ray). Brandy [name] was receptive to today's appointment as evidenced by openness to sharing, responsiveness to feedback, and willingness to continue engaging in mindfulness exercises.  Mental Status Examination:  Appearance: well groomed and appropriate hygiene  Behavior: appropriate to circumstances Mood: euthymic Affect: mood congruent Speech: normal in rate, volume, and tone Eye Contact: appropriate Psychomotor Activity: appropriate Gait: unable to assess Thought Process: linear, logical, and goal directed  Thought Content/Perception: denies suicidal and homicidal ideation, plan, and intent and no hallucinations, delusions, bizarre thinking or behavior reported or observed Orientation: time, person, place, and purpose of appointment Memory/Concentration: memory, attention, language, and fund of knowledge intact  Insight/Judgment: good  Interventions:  Conducted a brief chart review Provided empathic reflections and validation Employed supportive psychotherapy interventions to facilitate reduced distress and to improve coping skills with identified stressors Employed motivational interviewing skills to assess patient's willingness/desire to adhere to recommended medical treatments and assignments Engaged patient in mindfulness exercise(s) Employed acceptance and commitment interventions to emphasize mindfulness and acceptance without struggle  DSM-5 Diagnosis(es): 296.32 (F33.1) Major Depressive Disorder, Recurrent Episode, Moderate and 307.59 (F50.8) Other Specified Feeding or Eating Disorder, Emotional Eating Behaviors   Treatment Goal & Progress: During the initial appointment with this provider, the following treatment goal was established: increase coping skills. Brandy [name] has demonstrated progress in her goal as  evidenced by increased awareness of hunger patterns, increased awareness of triggers for emotional eating and reduction in emotional eating. Brandy [name] also continues to demonstrate willingness to engage in learned skill(s).  Plan: Due to Brandy [name] being on vacation, the next appointment will be scheduled in approximately two weeks, which will  be via MyChart Video Visit. The next session will focus on working towards the established treatment goal. Additionally, Brandy [name] reported she continues to meet with her other therapist, noting their next appointment is on Thursday.

## 2019-10-12 NOTE — Progress Notes (Signed)
Daily Session Note  Patient Details  Name: Brandy [name] MRN: [id number] Date of Birth: 1965-10-20 Referring Provider:     Pulmonary Rehab from 04/06/2019 in [facility]  Referring Provider [place]      Encounter Date: 10/12/2019  Check In:  Session Check In - 10/12/19 0947      Check-In   Supervising physician immediately available to respond to emergencies See telemetry face sheet for immediately available ER MD    Location [facility]    Staff Present Heath Lark, RN, BSN, Laveda Norman, BS, ACSM CEP, Exercise Physiologist;Joseph Tessie Fass RCP,RRT,BSRT    Virtual Visit No    Medication changes reported     No    Fall or balance concerns reported    No    Warm-up and Cool-down Performed on first and last piece of equipment    Resistance Training Performed Yes    VAD Patient? No      Pain Assessment   Currently in Pain? No/denies              Social History   Tobacco Use  Smoking Status Never Smoker  Smokeless Tobacco Never Used    Goals Met:  Proper associated with RPD/PD & O2 Sat Independence with exercise equipment Exercise tolerated well No report of cardiac concerns or symptoms  Goals Unmet:  Not Applicable  Comments: Pt able to follow exercise prescription today without complaint.  Will continue to monitor for progression.    Dr. Emily Filbert is Medical Director for [facility].

## 2019-10-13 DIAGNOSIS — M545 Low back pain: Secondary | ICD-10-CM | POA: Diagnosis not present

## 2019-10-13 DIAGNOSIS — M79605 Pain in left leg: Secondary | ICD-10-CM | POA: Diagnosis not present

## 2019-10-13 DIAGNOSIS — M79604 Pain in right leg: Secondary | ICD-10-CM | POA: Diagnosis not present

## 2019-10-13 DIAGNOSIS — M25512 Pain in left shoulder: Secondary | ICD-10-CM | POA: Diagnosis not present

## 2019-10-14 ENCOUNTER — Encounter: Payer: Self-pay | Admitting: *Deleted

## 2019-10-14 DIAGNOSIS — M62838 Other muscle spasm: Secondary | ICD-10-CM | POA: Diagnosis not present

## 2019-10-14 DIAGNOSIS — M545 Low back pain: Secondary | ICD-10-CM | POA: Diagnosis not present

## 2019-10-14 DIAGNOSIS — R0609 Other forms of dyspnea: Secondary | ICD-10-CM

## 2019-10-14 DIAGNOSIS — R0600 Dyspnea, unspecified: Secondary | ICD-10-CM

## 2019-10-14 DIAGNOSIS — I5032 Chronic diastolic (congestive) heart failure: Secondary | ICD-10-CM

## 2019-10-14 DIAGNOSIS — M6281 Muscle weakness (generalized): Secondary | ICD-10-CM | POA: Diagnosis not present

## 2019-10-14 DIAGNOSIS — M5417 Radiculopathy, lumbosacral region: Secondary | ICD-10-CM | POA: Diagnosis not present

## 2019-10-14 NOTE — Progress Notes (Signed)
Pulmonary Individual Treatment Plan  Patient Details  Name: IZZABELL KLASEN MRN: [id number] Date of Birth: 1965/05/30 Referring Provider:     Pulmonary Rehab from 04/06/2019 in [facility] Cardiac and Pulmonary Rehab  Referring Provider Gollan      Initial Encounter Date:    Pulmonary Rehab from 04/06/2019 in [facility] Cardiac and Pulmonary Rehab  Date 04/06/19      Visit Diagnosis: Heart failure, diastolic, chronic (HCC)  Dyspnea on exertion  Patient's Home Medications on Admission:  Current Outpatient Medications:  .  acetaminophen (TYLENOL) 650 MG CR tablet, Take 1,950 mg by mouth every 8 (eight) hours as needed for pain. , Disp: , Rfl:  .  ARIPiprazole (ABILIFY) 5 MG tablet, Take 5 mg by mouth daily. , Disp: , Rfl:  .  aspirin EC 81 MG tablet, Take 81 mg by mouth daily., Disp: , Rfl:  .  atorvastatin (LIPITOR) 10 MG tablet, TAKE 1 TABLET (10 MG TOTAL) BY MOUTH DAILY. FOR CHOLESTEROL., Disp: 90 tablet, Rfl: 0 .  celecoxib (CELEBREX) 100 MG capsule, TAKE 1 CAPSULE (100 MG TOTAL) BY MOUTH 2 (TWO) TIMES DAILY. AS NEEDED FOR PAIN. (Patient taking differently: Take 100 mg by mouth 2 (two) times daily. ), Disp: 180 capsule, Rfl: 1 .  cetirizine (ZYRTEC) 10 MG tablet, TAKE 1 TABLET BY MOUTH EVERY DAY (Patient taking differently: Take 10 mg by mouth daily. ), Disp: 90 tablet, Rfl: 0 .  cholecalciferol (VITAMIN D3) 25 MCG (1000 UT) tablet, Take 1,000 Units by mouth 4 (four) times a week. Take on the days your not taking 50000 unit vitamin d, Disp: , Rfl:  .  citalopram (CELEXA) 20 MG tablet, Take 40 mg by mouth daily. , Disp: , Rfl:  .  Continuous Blood Gluc Receiver (DEXCOM G6 RECEIVER) DEVI, Inject 1 Device into the skin daily., Disp: 4 each, Rfl: 1 .  Continuous Blood Gluc Sensor (DEXCOM G6 SENSOR) MISC, Inject 1 Device into the skin daily., Disp: 3 each, Rfl: 1 .  Continuous Blood Gluc Transmit (DEXCOM G6 TRANSMITTER) MISC, Inject 1 Device into the skin daily., Disp: 4 each, Rfl: 1 .  DULoxetine  (CYMBALTA) 30 MG capsule, Take 30 mg by mouth daily. Take with 60 mg to equal 90 mg daily, Disp: , Rfl:  .  DULoxetine (CYMBALTA) 60 MG capsule, Take 60 mg by mouth daily. Take with 30 mg to equal 90 mg daily, Disp: , Rfl:  .  folic acid (FOLVITE) 1 MG tablet, Take 1 mg by mouth daily., Disp: , Rfl:  .  gabapentin (NEURONTIN) 300 MG capsule, Take 1 capsule (300 mg total) by mouth 3 (three) times daily. For back pain. (Patient taking differently: Take 300 mg by mouth See admin instructions. Take 300 mg 3 times daily, may take a 4th 300 mg dose as needed for pain), Disp: 270 capsule, Rfl: 3 .  glipiZIDE (GLUCOTROL) 10 MG tablet, TAKE 1 TABLET (10 MG TOTAL) BY MOUTH 2 (TWO) TIMES DAILY BEFORE A MEAL. FOR DIABETES., Disp: 180 tablet, Rfl: 3 .  hydrocortisone 2.5 % cream, Apply 1 application topically 2 (two) times daily. , Disp: , Rfl:  .  Insulin Glargine (BASAGLAR KWIKPEN) 100 UNIT/ML SOPN, Inject 0.35 mLs (35 Units total) into the skin at bedtime., Disp: 15 mL, Rfl: 2 .  Insulin Pen Needle (PEN NEEDLES) 31G X 6 MM MISC, Use nightly with insulin., Disp: 100 each, Rfl: 2 .  lisinopril-hydrochlorothiazide (ZESTORETIC) 20-12.5 MG tablet, TAKE 1 TABLET BY MOUTH DAILY FOR BLOOD PRESSURE, Disp: 90  tablet, Rfl: 1 .  metFORMIN (GLUCOPHAGE-XR) 500 MG 24 hr tablet, TAKE 2 TABLETS (1,000 MG TOTAL) BY MOUTH DAILY WITH BREAKFAST. FOR DIABETES., Disp: 180 tablet, Rfl: 1 .  metroNIDAZOLE (METROCREAM) 0.75 % cream, Apply 1 application topically daily., Disp: , Rfl:  .  traZODone (DESYREL) 50 MG tablet, Take 25-50 mg by mouth at bedtime as needed for sleep. , Disp: , Rfl:  .  Vitamin D, Ergocalciferol, (DRISDOL) 1.25 MG (50000 UNIT) CAPS capsule, Take 1 capsule (50,000 Units total) by mouth every 3 (three) days., Disp: 10 capsule, Rfl: 0  Past Medical History: Past Medical History:  Diagnosis Date  . Anxiety   . Arthritis   . Back pain   . Brain fog   . CHF (congestive heart failure) ([place])    pt not aware of this   . Depression   . Diabetes mellitus without complication ([place])   . Difficulty walking   . Dysmetabolic syndrome X   . Dyspnea   . Fatigue   . Fibromyalgia   . Hypertension   . Hypertriglyceridemia   . Joint pain   . Leg weakness   . Lower extremity edema   . Muscle atrophy   . Neuropathy   . Obesity   . Osteoarthritis   . PONV (postoperative nausea and vomiting)   . Poor memory   . Post-menopausal bleeding   . Prediabetes   . Rosacea   . Sleep apnea    not on cpap yet  . Tetanus vaccine causing adverse effect in therapeutic use   . Vitamin D deficiency     Tobacco Use: Social History   Tobacco Use  Smoking Status Never Smoker  Smokeless Tobacco Never Used    Labs: Recent Review Flowsheet Data    Labs for ITP Cardiac and Pulmonary Rehab Latest Ref Rng & Units 08/29/2018 12/10/2018 03/18/2019 06/17/2019 08/25/2019   Cholestrol 100 - 199 mg/dL - - 160 - 162   LDLCALC 0 - 99 mg/dL - - 71 - 73   LDLDIRECT mg/dL - - - - -   HDL >39 mg/dL - - 56.70 - 69   Trlycerides 0 - 149 mg/dL - - 159.0(H) - 114   Hemoglobin A1c 4.8 - 5.6 % 8.5(H) 8.9(A) 9.1(A) 9.2(A) 7.8(H)   PHART 7.35 - 7.45 - - - - -   PCO2ART 32 - 48 mmHg - - - - -   HCO3 20.0 - 28.0 mmol/L - - - - -   ACIDBASEDEF 0.0 - 2.0 mmol/L - - - - -   O2SAT % - - - - -       Pulmonary Assessment Scores:   UCSD: Self-administered rating of dyspnea associated with activities of daily living (ADLs) 6-point scale (0 = "not at all" to 5 = "maximal or unable to do because of breathlessness")  Scoring Scores range from 0 to 120.  Minimally important difference is 5 units  CAT: CAT can identify the health impairment of COPD patients and is better correlated with disease progression.  CAT has a scoring range of zero to 40. The CAT score is classified into four groups of low (less than 10), medium (10 - 20), high (21-30) and very high (31-40) based on the impact level of disease on health status. A CAT score over 10 suggests  significant symptoms.  A worsening CAT score could be explained by an exacerbation, poor medication adherence, poor inhaler technique, or progression of COPD or comorbid conditions.  CAT MCID is 2  points  mMRC: mMRC (Modified Medical Research Council) Dyspnea Scale is used to assess the degree of baseline functional disability in patients of respiratory disease due to dyspnea. No minimal important difference is established. A decrease in score of 1 point or greater is considered a positive change.   Pulmonary Function Assessment:   Exercise Target Goals: Exercise Program Goal: Individual exercise prescription set using results from initial 6 min walk test and THRR while considering  patient's activity barriers and safety.   Exercise Prescription Goal: Initial exercise prescription builds to 30-45 minutes a day of aerobic activity, 2-3 days per week.  Home exercise guidelines will be given to patient during program as part of exercise prescription that the participant will acknowledge.  Education: Aerobic Exercise & Resistance Training: - Gives group verbal and written instruction on the various components of exercise. Focuses on aerobic and resistive training programs and the benefits of this training and how to safely progress through these programs..   Education: Exercise & Equipment Safety: - Individual verbal instruction and demonstration of equipment use and safety with use of the equipment.   Pulmonary Rehab from 04/06/2019 in [facility] Cardiac and Pulmonary Rehab  Date 04/06/19  Educator AS  Instruction Review Code 1- Verbalizes Understanding      Education: Exercise Physiology & General Exercise Guidelines: - Group verbal and written instruction with models to review the exercise physiology of the cardiovascular system and associated critical values. Provides general exercise guidelines with specific guidelines to those with heart or lung disease.    Education: Flexibility,  Balance, Mind/Body Relaxation: Provides group verbal/written instruction on the benefits of flexibility and balance training, including mind/body exercise modes such as yoga, pilates and tai chi.  Demonstration and skill practice provided.   Activity Barriers & Risk Stratification:   6 Minute Walk:  6 Minute Walk    Row Name 06/29/19 1140         6 Minute Walk   Distance 500 feet     Walk Time 4.5 minutes     # of Rest Breaks 2     MPH 1.26     METS 1     RPE 17     Perceived Dyspnea  3     VO2 Peak 3.3     Symptoms Yes (comment)     Comments shortness of breath/ back and leg pain     Resting HR 92 bpm     Resting BP 112/70     Resting Oxygen Saturation  96 %     Exercise Oxygen Saturation  during 6 min walk 95 %     Max Ex. HR 135 bpm     Max Ex. BP 146/88           Oxygen Initial Assessment:   Oxygen Re-Evaluation:  Oxygen Re-Evaluation    Row Name 06/29/19 2956 08/12/19 1153 09/23/19 0951         Program Oxygen Prescription   Program Oxygen Prescription None None None       Home Oxygen   Home Oxygen Device None None None     Sleep Oxygen Prescription None None None     Home Exercise Oxygen Prescription None None None     Home at Rest Exercise Oxygen Prescription -- None None     Compliance with Home Oxygen Use Yes -- Yes       Goals/Expected Outcomes   Short Term Goals To learn and demonstrate proper pursed lip breathing techniques or other breathing  techniques. To learn and demonstrate proper pursed lip breathing techniques or other breathing techniques.;To learn and understand importance of maintaining oxygen saturations>88%;To learn and understand importance of monitoring SPO2 with pulse oximeter and demonstrate accurate use of the pulse oximeter. To learn and demonstrate proper pursed lip breathing techniques or other breathing techniques.;To learn and understand importance of maintaining oxygen saturations>88%;To learn and understand importance of  monitoring SPO2 with pulse oximeter and demonstrate accurate use of the pulse oximeter.     Long  Term Goals Exhibits proper breathing techniques, such as pursed lip breathing or other method taught during program session Exhibits proper breathing techniques, such as pursed lip breathing or other method taught during program session;Maintenance of O2 saturations>88%;Verbalizes importance of monitoring SPO2 with pulse oximeter and return demonstration Exhibits proper breathing techniques, such as pursed lip breathing or other method taught during program session;Maintenance of O2 saturations>88%;Verbalizes importance of monitoring SPO2 with pulse oximeter and return demonstration     Comments Reviewed PLB technique with pt.  Talked about how it works and it's importance in maintaining their exercise saturations. Terrace is getting better with her PLB and her breathing overall is getting better.  Less at rest now!!  She has to remind herself to use the PLB.  Sent her videos to review about why it is important and how it works.  Her neurologist has also told her to work on it to help more with off gassing her CO2 stores. Analycia is getting better with her PLB.  We talked about need to monitor SpO2 and to download an app to her phone to help track it.     Goals/Expected Outcomes Short: Become more profiecient at using PLB.   Long: Become independent at using PLB. Short: More PLB  Long: Improve SOB overall Short: Download phone app for SpO2  Long: Continue to work on PLB.            Oxygen Discharge (Final Oxygen Re-Evaluation):  Oxygen Re-Evaluation - 09/23/19 0951      Program Oxygen Prescription   Program Oxygen Prescription None      Home Oxygen   Home Oxygen Device None    Sleep Oxygen Prescription None    Home Exercise Oxygen Prescription None    Home at Rest Exercise Oxygen Prescription None    Compliance with Home Oxygen Use Yes      Goals/Expected Outcomes   Short Term Goals To learn and  demonstrate proper pursed lip breathing techniques or other breathing techniques.;To learn and understand importance of maintaining oxygen saturations>88%;To learn and understand importance of monitoring SPO2 with pulse oximeter and demonstrate accurate use of the pulse oximeter.    Long  Term Goals Exhibits proper breathing techniques, such as pursed lip breathing or other method taught during program session;Maintenance of O2 saturations>88%;Verbalizes importance of monitoring SPO2 with pulse oximeter and return demonstration    Comments Deolinda is getting better with her PLB.  We talked about need to monitor SpO2 and to download an app to her phone to help track it.    Goals/Expected Outcomes Short: Download phone app for SpO2  Long: Continue to work on PLB.           Initial Exercise Prescription:   Perform Capillary Blood Glucose checks as needed.  Exercise Prescription Changes:  Exercise Prescription Changes    Row Name 05/06/19 1100 07/02/19 1100 07/15/19 1400 07/29/19 1500 08/14/19 0900     Response to Exercise   Blood Pressure (Admit) -- 118/70 142/74 160/100  154/92   Blood Pressure (Exercise) -- -- 142/84 134/70 164/82   Blood Pressure (Exit) -- 124/68 140/82 124/84 148/78   Heart Rate (Admit) -- 93 bpm 83 bpm 88 bpm 102 bpm   Heart Rate (Exercise) -- 120 bpm 106 bpm 105 bpm 110 bpm   Heart Rate (Exit) -- 98 bpm 99 bpm 97 bpm 83 bpm   Oxygen Saturation (Admit) -- 96 % 98 % 97 % 100 %   Oxygen Saturation (Exercise) -- 96 % 98 % 97 % 95 %   Oxygen Saturation (Exit) -- 96 % 98 % 98 % 95 %   Rating of Perceived Exertion (Exercise) -- '16 14 13 14   ' Perceived Dyspnea (Exercise) -- '1 1 3 1   ' Symptoms -- -- leg and back pain leg and back pain leg and back pain   Comments Reviewed Virtual Program -- -- -- --   Duration Progress to 10 minutes continuous walking  at current work load and total walking time to 30-45 min Progress to 30 minutes of  aerobic without signs/symptoms of physical  distress Progress to 30 minutes of  aerobic without signs/symptoms of physical distress Continue with 30 min of aerobic exercise without signs/symptoms of physical distress. Continue with 30 min of aerobic exercise without signs/symptoms of physical distress.   Intensity THRR unchanged THRR unchanged THRR unchanged THRR unchanged THRR unchanged     Progression   Progression Continue to progress workloads to maintain intensity without signs/symptoms of physical distress. Continue to progress workloads to maintain intensity without signs/symptoms of physical distress. Continue to progress workloads to maintain intensity without signs/symptoms of physical distress. Continue to progress workloads to maintain intensity without signs/symptoms of physical distress. Continue to progress workloads to maintain intensity without signs/symptoms of physical distress.   Average METs -- -- 1.73 1.2 1.55     Resistance Training   Training Prescription Yes Yes Yes Yes Yes   Weight 3 lb, ROM, body weight, bands 3 lb 3 lb 3 lb 3 lb   Reps 10-15 10-15 10-15 10-15 10-15     Interval Training   Interval Training No No No No No     Treadmill   MPH -- 0.6 0.7 0.6 0.6   Grade -- 0 0 0 --   Minutes -- '15 15 15 2   ' METs -- 1 1.5 1.4 1.4     NuStep   Level -- -- 1 3 --   Minutes -- -- 15 15 --   METs -- -- 2 1.3 --     Recumbant Elliptical   Level -- -- 1 1 --   Minutes -- -- 15 15 --   METs -- -- 1.4 1.1 --     T5 Nustep   Level -- -- -- -- 3   Minutes -- -- -- -- 15   METs -- -- -- -- 1.7     Biostep-RELP   Level -- -- 1 1 --   Minutes -- -- 15 15 --   METs -- -- 2 1 --     Track   Laps -- -- -- -- --   Minutes 10  at home along with videos -- -- -- --     Home Exercise Plan   Plans to continue exercise at Home (comment)  walking, videos -- Home (comment)  walking, videos Home (comment)  walking, videos Home (comment)  walking, videos   Frequency Add 3 additional days to program exercise  sessions. -- Add  3 additional days to program exercise sessions. Add 3 additional days to program exercise sessions. Add 3 additional days to program exercise sessions.   Initial Home Exercises Provided 05/06/19 -- 05/06/19 05/06/19 05/06/19   Row Name 08/25/19 1600 09/08/19 1600 09/23/19 1200         Response to Exercise   Blood Pressure (Admit) 144/72 146/64 124/84     Blood Pressure (Exercise) 126/64 126/82 124/70     Blood Pressure (Exit) 122/64 140/60 134/66     Heart Rate (Admit) 60 bpm 91 bpm 120 bpm     Heart Rate (Exercise) 93 bpm 92 bpm 102 bpm     Heart Rate (Exit) 85 bpm 91 bpm 104 bpm     Oxygen Saturation (Admit) 98 % 96 % 98 %     Oxygen Saturation (Exercise) 97 % 96 % 97 %     Oxygen Saturation (Exit) 98 % 91 % 96 %     Rating of Perceived Exertion (Exercise) '14 12 13     ' Perceived Dyspnea (Exercise) '2 3 1     ' Symptoms leg and back pain leg and back pain --     Duration Continue with 30 min of aerobic exercise without signs/symptoms of physical distress. Continue with 30 min of aerobic exercise without signs/symptoms of physical distress. Continue with 30 min of aerobic exercise without signs/symptoms of physical distress.     Intensity THRR unchanged THRR unchanged THRR unchanged       Progression   Progression Continue to progress workloads to maintain intensity without signs/symptoms of physical distress. Continue to progress workloads to maintain intensity without signs/symptoms of physical distress. Continue to progress workloads to maintain intensity without signs/symptoms of physical distress.     Average METs 1.5 1.45 1.8       Resistance Training   Training Prescription Yes Yes Yes     Weight 3 lb 3 lb 3 lb     Reps 10-15 10-15 10-15       Interval Training   Interval Training No No No       Treadmill   MPH 0.7 -- --     Grade 0 -- --     METs 1.54 -- --       NuStep   Level 3 -- 3     SPM 80 -- 80     Minutes 15 -- 15     METs 1.5 -- 1.8       T5  Nustep   Level -- 3 --     Minutes -- 30 --     METs -- 1.7 --       Home Exercise Plan   Plans to continue exercise at -- Home (comment)  walking, videos Home (comment)  walking, videos     Frequency -- Add 3 additional days to program exercise sessions. Add 3 additional days to program exercise sessions.     Initial Home Exercises Provided -- 05/06/19 05/06/19            Exercise Comments:  Exercise Comments    Row Name 06/29/19 [phone]           Exercise Comments First full day of exercise!  Patient was oriented to gym and equipment including functions, settings, policies, and procedures.  Patient's individual exercise prescription and treatment plan were reviewed.  All starting workloads were established based on the results of the 6 minute walk test done at initial orientation visit.  The plan for exercise  progression was also introduced and progression will be customized based on patient's performance and goals.              Exercise Goals and Review:   Exercise Goals Re-Evaluation :  Exercise Goals Re-Evaluation    Row Name 04/22/19 1247 05/06/19 1151 05/27/19 1205 06/24/19 1146 06/29/19 0948     Exercise Goal Re-Evaluation   Exercise Goals Review -- Increase Physical Activity;Increase Strength and Stamina;Able to understand and use rate of perceived exertion (RPE) scale;Able to understand and use Dyspnea scale;Knowledge and understanding of Target Heart Rate Range (THRR);Able to check pulse independently;Understanding of Exercise Prescription Increase Physical Activity;Increase Strength and Stamina;Understanding of Exercise Prescription Increase Physical Activity;Increase Strength and Stamina;Understanding of Exercise Prescription Able to understand and use rate of perceived exertion (RPE) scale;Able to understand and use Dyspnea scale;Knowledge and understanding of Target Heart Rate Range (THRR);Understanding of Exercise Prescription   Comments Not starting until January  Reviewed home exercise with pt today.  Pt plans to walk and use staff videos at home for exercise.  Reviewed THR, pulse, RPE, sign and symptoms, and when to call 911 or MD.  Also discussed weather considerations and indoor options.  Pt voiced understanding. Laisha is doing okay at home.  She had a lot of questions about her exercise. She was still not sure how to start going.  We talked about downloading [employer] to help prompt her to exercise her three days a week and to record her vitals.  Resent SMS for app. She is going to start with walking and/or staff videos.  We reviewed the order for videos (warm up, weights, cardio, stretch/cool down) and walking.  She feels more confident with how to get started now. She is doing well with her exercise and has a renewed sense of motivation to get in shape and get moving again. She has a new commitment to exercise and her overall wellbeing. Reviewed RPE scale, THR and program prescription with pt today.  Pt voiced understanding and was given a copy of goals to take home.   Expected Outcomes -- Short: Start to exercise at home for 10 min  Long: Work stamina up to 30  min of exercise. Short: Start to exercise at home 10 min 3x week.  Long: Build up strength and stamina. Short: Return to in-person rehab regularly.  Long: Continue to build up stamina. Short: Use RPE daily to regulate intensity. Long: Follow program prescription in THR.   [place] Name 07/02/19 1146 07/15/19 1415 07/29/19 1540 08/12/19 1151 08/25/19 1611     Exercise Goal Re-Evaluation   Exercise Goals Review Increase Physical Activity;Increase Strength and Stamina;Able to understand and use rate of perceived exertion (RPE) scale;Able to understand and use Dyspnea scale;Knowledge and understanding of Target Heart Rate Range (THRR);Able to check pulse independently;Understanding of Exercise Prescription Increase Physical Activity;Increase Strength and Stamina;Understanding of Exercise Prescription Increase  Physical Activity;Increase Strength and Stamina;Understanding of Exercise Prescription Increase Physical Activity;Increase Strength and Stamina;Understanding of Exercise Prescription Increase Strength and Stamina;Increase Physical Activity;Able to understand and use rate of perceived exertion (RPE) scale;Able to understand and use Dyspnea scale;Knowledge and understanding of Target Heart Rate Range (THRR);Able to check pulse independently;Understanding of Exercise Prescription   Comments Bell is tolerating exercise well so far.  Staff will monitor progress. Jozey is doing well in rehab.  She is enjoying her classmates and starting to like her exercise.  She is up to 2 METs on the BioStep and NuStep.  We will continue to monitor her  progress. Kimmarie has been doing well in rehab.  She continues to enjoy her classmates and misses it when she is out.  She is now on the treadmill for the full 15 min!!  We will continue to montior her progress. Erinne has found that coming to the structured class has been very helpful!  She finds that she gets lost in her to-dos at home. We did talk about using the videos more at home at least twice a week and also resent her the links to help too.  She is feeling better and has already started to notice changes and improve stamina. Kamariyah attends consistently adn works in correct RPE range.  Staff will follow up about exercising at home.   Expected Outcomes Short :  attend consistently Long:  improve stamina Short: Continue to push self in class Long: Continue to improve stamina. Short: Continue to improve workloads.  Long: Continue to improve stamina. Short: Add in chair routine at home  Long: Continue to improve stamina. Short:  exercise consistently outside program sessions Long: continue to improve stamina   Row Name 09/08/19 1602 09/23/19 0938 10/05/19 1621         Exercise Goal Re-Evaluation   Exercise Goals Review Increase Physical Activity;Increase Strength and  Stamina;Understanding of Exercise Prescription Increase Physical Activity;Increase Strength and Stamina;Understanding of Exercise Prescription --     Comments [name] is doing well in rehab.  She is getting in 30-40 min on the T5 at level 3!  We will continue to encourage good attendane and monitor her progress. Yazmyn is doing well in rehab.  Her attendance has been lacking recently due to doctor's appointment.  She is not doing her home exercise, but she works hard when she is here.  She talks to herself about it but lack the energy to get up and get going.  She also oversleeps on occasion.  We talked about just getting here in other means.  We also talked about using the staff videos Out since last review.     Expected Outcomes Short: Continue to push in class Long; Continue to exercise on off days at home to improve stamina. Short: Add exercise back in at home and call on days you wake up late  Long; Continue to improve stamina. --            Discharge Exercise Prescription (Final Exercise Prescription Changes):  Exercise Prescription Changes - 09/23/19 1200      Response to Exercise   Blood Pressure (Admit) 124/84    Blood Pressure (Exercise) 124/70    Blood Pressure (Exit) 134/66    Heart Rate (Admit) 120 bpm    Heart Rate (Exercise) 102 bpm    Heart Rate (Exit) 104 bpm    Oxygen Saturation (Admit) 98 %    Oxygen Saturation (Exercise) 97 %    Oxygen Saturation (Exit) 96 %    Rating of Perceived Exertion (Exercise) 13    Perceived Dyspnea (Exercise) 1    Duration Continue with 30 min of aerobic exercise without signs/symptoms of physical distress.    Intensity THRR unchanged      Progression   Progression Continue to progress workloads to maintain intensity without signs/symptoms of physical distress.    Average METs 1.8      Resistance Training   Training Prescription Yes    Weight 3 lb    Reps 10-15      Interval Training   Interval Training No      NuStep  Level 3    SPM  80    Minutes 15    METs 1.8      Home Exercise Plan   Plans to continue exercise at Home (comment)   walking, videos   Frequency Add 3 additional days to program exercise sessions.    Initial Home Exercises Provided 05/06/19           Nutrition:  Target Goals: Understanding of nutrition guidelines, daily intake of sodium <1523m, cholesterol <2054m calories 30% from fat and 7% or less from saturated fats, daily to have 5 or more servings of fruits and vegetables.  Education: Controlling Sodium/Reading Food Labels -Group verbal and written material supporting the discussion of sodium use in heart healthy nutrition. Review and explanation with models, verbal and written materials for utilization of the food label.   Education: General Nutrition Guidelines/Fats and Fiber: -Group instruction provided by verbal, written material, models and posters to present the general guidelines for heart healthy nutrition. Gives an explanation and review of dietary fats and fiber.   Biometrics:    Nutrition Therapy Plan and Nutrition Goals:  Nutrition Therapy & Goals - 05/04/19 1300      Nutrition Therapy   Diet DM, HH, low Na    Drug/Food Interactions Statins/Certain Fruits    Protein (specify units) 130g    Fiber 25 grams    Whole Grain Foods 3 servings    Saturated Fats 12 max. grams    Fruits and Vegetables 5 servings/day    Sodium 1.5 grams      Personal Nutrition Goals   Nutrition Goal ST: add 1 fruit/vegetable serving LT: Would like to be under 165lb    Comments Basaglar Long Lasting. Pt reports starting [email or web address] (lose 4% in six months). MyPlate method. 3 meals and 2-3 snacks. Pt reports wanting to meal prep. Discussed DM friendly eating, honoring hunger, expectations. Discussed some meal and snack ideas. will follow up in 2 weeks for more education and goals.      Intervention Plan   Intervention Prescribe, educate and counsel regarding individualized specific dietary  modifications aiming towards targeted core components such as weight, hypertension, lipid management, diabetes, heart failure and other comorbidities.;Nutrition handout(s) given to patient.    Expected Outcomes Short Term Goal: Understand basic principles of dietary content, such as calories, fat, sodium, cholesterol and nutrients.;Short Term Goal: A plan has been developed with personal nutrition goals set during dietitian appointment.;Long Term Goal: Adherence to prescribed nutrition plan.           Nutrition Assessments:   MEDIFICTS Score Key:          ?70 Need to make dietary changes          40-70 Heart Healthy Diet         ? 40 Therapeutic Level Cholesterol Diet  Nutrition Goals Re-Evaluation:  Nutrition Goals Re-Evaluation    Ro[place]eligmaname 05/18/19 1302 07/21/19 1233 08/12/19 1045 08/31/19 1103 08/31/19 1445     Goals   Nutrition Goal ST: continue to not drink pop  LT: Would like to be under 165lb ST: continue to not drink pop  LT: Would like to be under 165lb ST: continue to not drink pop  LT: Would like to be under 165lb -- ST: Continue with [place] diet plan  LT: Would like to be under 165lb   Comment Able to add 1 F/V a day: strawberries, blueberries, bananas, maderines, cucumber, salads (3x/week), spinach. Pt reports energy levels are good. Pt reports she is  emotionally drained; recent loss, friend with new breast cancer diagnosis, and son is a "handful". Pt reports having a good support system. Pt reports not drinking pop - discussed other options for drinks so she doesnt get bored (hibiscus tea). Pt reports lost 8 lbs, still has strength. Pt would like to continue with pop as a goal as it has been hard for her. Continue with current changes Continue with current changes opened in error Advised against ignoring hunger and restrictive dieting. Discussed ways to improve health while enjoying food. Pt would like to continue with diet plan. Will monitor.   Expected Outcome ST: continue  to not drink pop  LT: Would like to be under 165lb ST: continue to not drink pop  LT: Would like to be under 165lb ST: continue to not drink pop  LT: Would like to be under 165lb -- ST: Continue with [place] diet plan  LT: Would like to be under 165lb   Row Name 09/23/19 1000             Goals   Nutrition Goal ST: Continue with [place] diet plan  LT: Would like to be under 165lb       Comment Advised against ignoring hunger and restrictive dieting. Discussed ways to improve health while enjoying food. Energy levels are very low, pt reports not hungry all the time. Meeting with doctor today.  Pt would like to continue with diet plan. Will monitor.       Expected Outcome ST: Continue with [place] diet plan  LT: Would like to be under 165lb              Nutrition Goals Discharge (Final Nutrition Goals Re-Evaluation):  Nutrition Goals Re-Evaluation - 09/23/19 1000      Goals   Nutrition Goal ST: Continue with [place] diet plan  LT: Would like to be under 165lb    Comment Advised against ignoring hunger and restrictive dieting. Discussed ways to improve health while enjoying food. Energy levels are very low, pt reports not hungry all the time. Meeting with doctor today.  Pt would like to continue with diet plan. Will monitor.    Expected Outcome ST: Continue with [place] diet plan  LT: Would like to be under 165lb           Psychosocial: Target Goals: Acknowledge presence or absence of significant depression and/or stress, maximize coping skills, provide positive support system. Participant is able to verbalize types and ability to use techniques and skills needed for reducing stress and depression.   Education: Depression - Provides group verbal and written instruction on the correlation between heart/lung disease and depressed mood, treatment options, and the stigmas associated with seeking treatment.   Education: Sleep Hygiene -Provides group verbal and written  instruction about how sleep can affect your health.  Define sleep hygiene, discuss sleep cycles and impact of sleep habits. Review good sleep hygiene tips.    Education: Stress and Anxiety: - Provides group verbal and written instruction about the health risks of elevated stress and causes of high stress.  Discuss the correlation between heart/lung disease and anxiety and treatment options. Review healthy ways to manage with stress and anxiety.   Initial Review & Psychosocial Screening:  Initial Psych Review & Screening - 07/06/19 0951      Initial Review   Current issues with Current Depression;Current Psychotropic Meds;History of Depression;Current Stress Concerns;Current Sleep Concerns    Source of Stress Concerns Chronic Illness;Occupation;Family;Unable to perform yard/household activities  Comments She is out of work with a sever autistic son. She is generally negative and is trying to be more positive. She talks to a therapist and takes medsication to help her cope better. She is determined to exercise, get weight off and be more positive.      Family Dynamics   Good Support System? Yes    Comments Patient sees a therapist and is taking medication to help support her. She is excited to exercise and be less short of breath.      Barriers   Psychosocial barriers to participate in program The patient should benefit from training in stress management and relaxation.      Screening Interventions   Interventions Encouraged to exercise;To provide support and resources with identified psychosocial needs;Provide feedback about the scores to participant    Expected Outcomes Short Term goal: Utilizing psychosocial counselor, staff and physician to assist with identification of specific Stressors or current issues interfering with healing process. Setting desired goal for each stressor or current issue identified.;Long Term Goal: Stressors or current issues are controlled or eliminated.;Short  Term goal: Identification and review with participant of any Quality of Life or Depression concerns found by scoring the questionnaire.;Long Term goal: The participant improves quality of Life and PHQ9 Scores as seen by post scores and/or verbalization of changes           Quality of Life Scores:  Scores of 19 and below usually indicate a poorer quality of life in these areas.  A difference of  2-3 points is a clinically meaningful difference.  A difference of 2-3 points in the total score of the Quality of Life Index has been associated with significant improvement in overall quality of life, self-image, physical symptoms, and general health in studies assessing change in quality of life.  PHQ-9: Recent Review Flowsheet Data    Depression screen [facility] 2/9 08/25/2019 07/06/2019 04/06/2019 03/30/2019   Decreased Interest '3 2 1 3   ' Down, Depressed, Hopeless '3 2 2 1   ' PHQ - 2 Score '6 4 3 4   ' Altered sleeping '3 3 3 3   ' Tired, decreased energy '3 3 3 3   ' Change in appetite '3 2 3 3   ' Feeling bad or failure about yourself  '2 3 3 3   ' Trouble concentrating '3 3 3 3   ' Moving slowly or fidgety/restless '3 1 1 1   ' Suicidal thoughts 2 0 0 0   PHQ-9 Score '25 19 19 20   ' Difficult doing work/chores - Extremely dIfficult Extremely dIfficult Extremely dIfficult     Interpretation of Total Score  Total Score Depression Severity:  1-4 = Minimal depression, 5-9 = Mild depression, 10-14 = Moderate depression, 15-19 = Moderately severe depression, 20-27 = Severe depression   Psychosocial Evaluation and Intervention:   Psychosocial Re-Evaluation:  Psychosocial Re-Evaluation    [place] Name 08/12/19 1157 08/26/19 0948 09/23/19 0947         Psychosocial Re-Evaluation   Current issues with Current Depression;Current Anxiety/Panic;Current Stress Concerns;Current Sleep Concerns -- Current Depression;Current Anxiety/Panic;Current Stress Concerns;Current Sleep Concerns     Comments Lilah continues to see her therapist  regularly and feels that she is well managed on her current medication regimen.  Her son is doing better and had a good day yesterday, but still has his moments and these really fluster her.  She continues to work on her positive self talk, she is becoming more aware of it now and just needs to work on  being more mindful about it.  She is on a journey for herself and seems to be committed to making these changes. Hannia continues to see her therapist weekly.  She continues to waor on positive self talk and mindfulness. Adrinne is still working on her positive self talk, especially on days she over sleeps.  She continues to see her therapist and food therapist weekly. She over sleeps some days and then gets up set.  She is doing better overall and making small progress.     Expected Outcomes Short: Work on positive self talk  Long: Continue to find positive coping skills. Short:  continue ot work on positive self talk Long:  maintain positive attitude Short:  continue ot work on positive self talk Long:  maintain positive attitude     Interventions Stress management education;Encouraged to attend Cardiac Rehabilitation for the exercise -- Stress management education;Encouraged to attend Cardiac Rehabilitation for the exercise     Continue Psychosocial Services  Follow up required by staff -- Follow up required by staff            Psychosocial Discharge (Final Psychosocial Re-Evaluation):  Psychosocial Re-Evaluation - 09/23/19 0947      Psychosocial Re-Evaluation   Current issues with Current Depression;Current Anxiety/Panic;Current Stress Concerns;Current Sleep Concerns    Comments Rhilee is still working on her positive self talk, especially on days she over sleeps.  She continues to see her therapist and food therapist weekly. She over sleeps some days and then gets up set.  She is doing better overall and making small progress.    Expected Outcomes Short:  continue ot work on positive self talk Long:   maintain positive attitude    Interventions Stress management education;Encouraged to attend Cardiac Rehabilitation for the exercise    Continue Psychosocial Services  Follow up required by staff           Education: Education Goals: Education classes will be provided on a weekly basis, covering required topics. Participant will state understanding/return demonstration of topics presented.  Learning Barriers/Preferences:   General Pulmonary Education Topics:  Infection Prevention: - Provides verbal and written material to individual with discussion of infection control including proper hand washing and proper equipment cleaning during exercise session.   Pulmonary Rehab from 04/06/2019 in [facility] Cardiac and Pulmonary Rehab  Date 04/06/19  Educator AS  Instruction Review Code 1- Verbalizes Understanding      Falls Prevention: - Provides verbal and written material to individual with discussion of falls prevention and safety.   Pulmonary Rehab from 04/06/2019 in [facility] Cardiac and Pulmonary Rehab  Date 04/06/19  Educator AS  Instruction Review Code 1- Verbalizes Understanding      Chronic Lung Diseases: - Group verbal and written instruction to review updates, respiratory medications, advancements in procedures and treatments. Discuss use of supplemental oxygen including available portable oxygen systems, continuous and intermittent flow rates, concentrators, personal use and safety guidelines. Review proper use of inhaler and spacers. Provide informative websites for self-education.    Energy Conservation: - Provide group verbal and written instruction for methods to conserve energy, plan and organize activities. Instruct on pacing techniques, use of adaptive equipment and posture/positioning to relieve shortness of breath.   Triggers and Exacerbations: - Group verbal and written instruction to review types of environmental triggers and ways to prevent exacerbations. Discuss  weather changes, air quality and the benefits of nasal washing. Review warning signs and symptoms to help prevent infections. Discuss techniques for effective airway clearance, coughing,  and vibrations.   AED/CPR: - Group verbal and written instruction with the use of models to demonstrate the basic use of the AED with the basic ABC's of resuscitation.   Anatomy and Physiology of the Lungs: - Group verbal and written instruction with the use of models to provide basic lung anatomy and physiology related to function, structure and complications of lung disease.   Anatomy & Physiology of the Heart: - Group verbal and written instruction and models provide basic cardiac anatomy and physiology, with the coronary electrical and arterial systems. Review of Valvular disease and Heart Failure   Cardiac Medications: - Group verbal and written instruction to review commonly prescribed medications for heart disease. Reviews the medication, class of the drug, and side effects.   Other: -Provides group and verbal instruction on various topics (see comments)   Knowledge Questionnaire Score:    Core Components/Risk Factors/Patient Goals at Admission:   Education:Diabetes - Individual verbal and written instruction to review signs/symptoms of diabetes, desired ranges of glucose level fasting, after meals and with exercise. Acknowledge that pre and post exercise glucose checks will be done for 3 sessions at entry of program.   Education: Know Your Numbers and Risk Factors: -Group verbal and written instruction about important numbers in your health.  Discussion of what are risk factors and how they play a role in the disease process.  Review of Cholesterol, Blood Pressure, Diabetes, and BMI and the role they play in your overall health.   Core Components/Risk Factors/Patient Goals Review:   Goals and Risk Factor Review    Row Name 05/27/19 1205 06/24/19 1147 08/12/19 1155 08/26/19 0952 09/23/19  0949     Core Components/Risk Factors/Patient Goals Review   Personal Goals Review Weight Management/Obesity;Hypertension;Heart Failure Weight Management/Obesity;Hypertension;Heart Failure Weight Management/Obesity;Hypertension;Heart Failure;Improve shortness of breath with ADL's Weight Management/Obesity;Hypertension;Heart Failure;Improve shortness of breath with ADL's Weight Management/Obesity;Hypertension;Heart Failure;Improve shortness of breath with ADL's   Review She was discouraged today as her weight was up 2 lb and she was feeling more SOB than normal.  We talked about possible causes and she admitted to doing more at home the last few days (more than her normal) and thinks she has overdone it.  We talked about DOMS and how it can make you feel worse for a few days but it will improve.  This is also a symptoms of her heart failure so we talked about combatting that as well.  She is going to give it another day to see if she improves and move a little more today to help release soreness and get fluid moving out.  She has not been checking her blood pressures routinely but will start to get into that habit since the app will remind her to track it.  She is eager to get moving and to start feeling better again. Her weight has been going up and down and she wants to lose the weight for good.  She is now committed to coming to in person rehab. Zeah is doing better overall.  She is feeling better and working on the weight loss.  She went up some and got discouraged, but has already started to lose it again.  Her pressures have been good.  She monitoring her heart failure symptoms and thinks her swelling is improving. She is still having SOB with any activity but it is improving. [name] is taking meds as directed.  She started a new health and wellness program.  She really enjoys the T5 and is  happy to find a machine she can be successful on. Carolyn is now on physican prescribed diet plan and feeling fatigued but  she is losing some weight.  She is able to do more and breathing is improving some.  She denies heart failure symptoms.  She is not checking her pressures at home as they are good in class.  We talked about logging her pressures at home for her doctor.   Expected Outcomes Short: Watch weight closely with heart failure.  Long: Manage heart failure Short: Work on weight loss.  Long: Continue to manage heart failure. Short: Work on weight loss.  Long: Continue to manage heart failure. Short: continue to work on healthy habits Long: continue to manage risk factors Short: Start logging pressures at home Long: Continue to work on weight loss.          Core Components/Risk Factors/Patient Goals at Discharge (Final Review):   Goals and Risk Factor Review - 09/23/19 0949      Core Components/Risk Factors/Patient Goals Review   Personal Goals Review Weight Management/Obesity;Hypertension;Heart Failure;Improve shortness of breath with ADL's    Review Deyanna is now on physican prescribed diet plan and feeling fatigued but she is losing some weight.  She is able to do more and breathing is improving some.  She denies heart failure symptoms.  She is not checking her pressures at home as they are good in class.  We talked about logging her pressures at home for her doctor.    Expected Outcomes Short: Start logging pressures at home Long: Continue to work on weight loss.           ITP Comments:  ITP Comments    Row Name 04/22/19 1246 04/29/19 1771 05/04/19 1340 05/06/19 1148 05/18/19 1335   ITP Comments Jocabed will be out until after the new year. 30 day review competed . ITP sent to Dr Emily Filbert for review, changes as needed and ITP approval signature Completed initial RD eval Completed virtual appt today.  Reviewed Virtual Home Exercise Guidelines and program.  Delina wants to do the fully virtual program while COVID-19 is still high as she is not comfortable coming into the hospital at this time.  Once things  calm down, she would like to do the in person program.  Enrolled in [employer]. Completed 2 week f/u   Row Name 05/27/19 1205 06/24/19 1146 06/29/19 0948 07/22/19 1044 08/19/19 0640   ITP Comments Completed virtual visit today.  Next follow up scheduled with Melissa on 2/1 and me on 2/10.  She would like to see if she can change to a Thurs with Melissa so it's not as long between calls. Completed virtual follow up today.  Tamsen is ready to return to in person rehab.  She is scheduled to restart next week.  We will do a walk test and new ITP on her first visit. First full day of exercise!  Patient was oriented to gym and equipment including functions, settings, policies, and procedures.  Patient's individual exercise prescription and treatment plan were reviewed.  All starting workloads were established based on the results of the 6 minute walk test done at initial orientation visit.  The plan for exercise progression was also introduced and progression will be customized based on patient's performance and goals. 30 day chart review completed. ITP sent to Dr Zachery Dakins Medical Director, for review,changes as needed and signature. 30 Day review completed. Medical Director review done, changes made as directed,and approval shown by signature  of Market researcher.   [place] Name 08/31/19 1449 09/16/19 0805 10/05/19 1620 10/14/19 0656     ITP Comments Completed Nutrition Re-Evaluation 30 Day review completed. Medical Director review done, changes made as directed,and approval shown by signature of Market researcher. Milley sent email last week stating that she had missed because she had a cold and was not feeling well.  She hopes to return this week. 30 Day review completed. Medical Director ITP review done, changes made as directed, and signed approval by Medical Director.           Comments: 30 Day review completed. Medical Director ITP review done, changes made as directed, and signed approval by Medical  Director.

## 2019-10-15 ENCOUNTER — Encounter: Payer: BC Managed Care – PPO | Admitting: *Deleted

## 2019-10-15 ENCOUNTER — Other Ambulatory Visit: Payer: Self-pay

## 2019-10-15 ENCOUNTER — Ambulatory Visit (INDEPENDENT_AMBULATORY_CARE_PROVIDER_SITE_OTHER): Payer: BC Managed Care – PPO | Admitting: Psychology

## 2019-10-15 DIAGNOSIS — I509 Heart failure, unspecified: Secondary | ICD-10-CM | POA: Diagnosis not present

## 2019-10-15 DIAGNOSIS — F3289 Other specified depressive episodes: Secondary | ICD-10-CM

## 2019-10-15 DIAGNOSIS — Z6841 Body Mass Index (BMI) 40.0 and over, adult: Secondary | ICD-10-CM | POA: Diagnosis not present

## 2019-10-15 DIAGNOSIS — I5032 Chronic diastolic (congestive) heart failure: Secondary | ICD-10-CM

## 2019-10-15 DIAGNOSIS — R0600 Dyspnea, unspecified: Secondary | ICD-10-CM | POA: Diagnosis not present

## 2019-10-15 DIAGNOSIS — Z713 Dietary counseling and surveillance: Secondary | ICD-10-CM | POA: Diagnosis not present

## 2019-10-15 DIAGNOSIS — I872 Venous insufficiency (chronic) (peripheral): Secondary | ICD-10-CM | POA: Diagnosis not present

## 2019-10-15 DIAGNOSIS — E1159 Type 2 diabetes mellitus with other circulatory complications: Secondary | ICD-10-CM | POA: Diagnosis not present

## 2019-10-15 NOTE — Progress Notes (Signed)
Daily Session Note  Patient Details  Name: Brandy [name] MRN: [id number] Date of Birth: [birth year] Referring Provider:     Pulmonary Rehab from 04/06/2019 in [facility] Cardiac and Pulmonary Rehab  Referring Provider Gollan      Encounter Date: 10/15/2019  Check In:  Session Check In - 10/15/19 1123      Check-In   Supervising physician immediately available to respond to emergencies See telemetry face sheet for immediately available ER MD    Location [facility]-Cardiac & Pulmonary Rehab    Staff Present Renita Papa, RN BSN;Melissa Caiola RDN, Rowe Pavy, BA, ACSM CEP, Exercise Physiologist    Virtual Visit No    Medication changes reported     No    Fall or balance concerns reported    No    Warm-up and Cool-down Performed on first and last piece of equipment    Resistance Training Performed Yes    VAD Patient? No    PAD/SET Patient? No      Pain Assessment   Currently in Pain? No/denies              Social History   Tobacco Use  Smoking Status Never Smoker  Smokeless Tobacco Never Used    Goals Met:  Independence with exercise equipment Exercise tolerated well No report of cardiac concerns or symptoms Strength training completed today  Goals Unmet:  Not Applicable  Comments: Pt able to follow exercise prescription today without complaint.  Will continue to monitor for progression.    Dr. Emily Filbert is Medical Director for [place] and [facility].

## 2019-10-20 ENCOUNTER — Encounter (INDEPENDENT_AMBULATORY_CARE_PROVIDER_SITE_OTHER): Payer: Self-pay | Admitting: Family Medicine

## 2019-10-20 ENCOUNTER — Ambulatory Visit (INDEPENDENT_AMBULATORY_CARE_PROVIDER_SITE_OTHER): Payer: BC Managed Care – PPO | Admitting: Family Medicine

## 2019-10-20 ENCOUNTER — Other Ambulatory Visit: Payer: Self-pay

## 2019-10-20 VITALS — BP 113/83 | HR 110 | Temp 98.6°F | Ht 66.0 in | Wt 358.0 lb

## 2019-10-20 DIAGNOSIS — Z6841 Body Mass Index (BMI) 40.0 and over, adult: Secondary | ICD-10-CM | POA: Diagnosis not present

## 2019-10-20 DIAGNOSIS — E1169 Type 2 diabetes mellitus with other specified complication: Secondary | ICD-10-CM

## 2019-10-22 ENCOUNTER — Ambulatory Visit: Payer: BC Managed Care – PPO | Admitting: Psychology

## 2019-10-24 ENCOUNTER — Encounter: Payer: Self-pay | Admitting: Family Medicine

## 2019-10-24 ENCOUNTER — Telehealth (INDEPENDENT_AMBULATORY_CARE_PROVIDER_SITE_OTHER): Payer: BC Managed Care – PPO | Admitting: Family Medicine

## 2019-10-24 DIAGNOSIS — J069 Acute upper respiratory infection, unspecified: Secondary | ICD-10-CM | POA: Diagnosis not present

## 2019-10-24 MED ORDER — LIDOCAINE VISCOUS HCL 2 % MT SOLN
5.0000 mL | Freq: Four times a day (QID) | OROMUCOSAL | 0 refills | Status: DC | PRN
Start: 2019-10-24 — End: 2020-02-08

## 2019-10-24 MED ORDER — DOXYCYCLINE HYCLATE 100 MG PO TABS
100.0000 mg | ORAL_TABLET | Freq: Two times a day (BID) | ORAL | 0 refills | Status: DC
Start: 2019-10-24 — End: 2020-02-08

## 2019-10-24 NOTE — Progress Notes (Signed)
Virtual visit completed through WebEx or similar program Patient location: home  Provider location: [place] at [facility], office  Participants: Patient and me (unless stated otherwise below)  Pandemic considerations d/w pt.   Limitations and rationale for visit method d/w patient.  Patient agreed to proceed.   CC: URI sx.    HPI:  She has facial pain, nasal congestion, scratchy throat, headache located forehead wraps around to the back of head.... x4-5 days... family members sick with similar sx.... pt has tried OTC Zyrtec and Tylenol arthritis with minimal relief.  Has some body aches/chills---feel warm  Recently with sugar <200.  Recently up to ~190s.    Scant phlegm.  No vomiting.  Some nausea.  No wheeze.  Chest doesn't feel tight but a little chest congestion.    Vaccinated for covid.  She had been tested for covid about 2.5 weeks ago, negative.  She had D&C done, no complications.  She was intubated for that.    Overall she feels like she had a little more energy yesterday and today.  She has more congestion in the AMs, more facial pain.  Still with some fatigue.    Meds and allergies reviewed.   ROS: Per HPI unless specifically indicated in ROS section   NAD Speech wnl  A/P: URI sx.  Nontoxic.  Prev vaccinated for covid.   Likely benign viral URI.  ddx d/w pt.  Rest and fluids.   No change in DM2 meds at this point.   Warm compresses for facial pain.   Discussed chest sx and ST. Can do warm salt water gargles.  Can try mucinex for congestion and lidocaine if needed for ST.   Hold doxy for now, d/w pt about rationale and she agreed.   She can start if sx >1 week with facial pain.  She'll  Update [place] as needed.

## 2019-10-24 NOTE — Assessment & Plan Note (Signed)
URI sx.  Nontoxic.  Prev vaccinated for covid.   Likely benign viral URI.  ddx d/w pt.  Rest and fluids.   No change in DM2 meds at this point.   Warm compresses for facial pain.   Discussed chest sx and ST. Can do warm salt water gargles.  Can try mucinex for congestion and lidocaine if needed for ST.   Hold doxy for now, d/w pt about rationale and she agreed.   She can start if sx >1 week with facial pain.  She'll  Update [place] as needed.

## 2019-10-26 ENCOUNTER — Other Ambulatory Visit: Payer: Self-pay

## 2019-10-26 ENCOUNTER — Telehealth (INDEPENDENT_AMBULATORY_CARE_PROVIDER_SITE_OTHER): Payer: BC Managed Care – PPO | Admitting: Psychology

## 2019-10-26 DIAGNOSIS — F5089 Other specified eating disorder: Secondary | ICD-10-CM

## 2019-10-26 DIAGNOSIS — F331 Major depressive disorder, recurrent, moderate: Secondary | ICD-10-CM | POA: Diagnosis not present

## 2019-10-27 ENCOUNTER — Telehealth: Payer: Self-pay | Admitting: *Deleted

## 2019-10-27 ENCOUNTER — Encounter: Payer: Self-pay | Admitting: *Deleted

## 2019-10-27 DIAGNOSIS — R0609 Other forms of dyspnea: Secondary | ICD-10-CM

## 2019-10-27 DIAGNOSIS — R0600 Dyspnea, unspecified: Secondary | ICD-10-CM

## 2019-10-27 DIAGNOSIS — I5032 Chronic diastolic (congestive) heart failure: Secondary | ICD-10-CM

## 2019-10-27 NOTE — Telephone Encounter (Signed)
Brandy [name] called and left a message to be called back.  Left her a message to call back again.  She has been out since 6/17 with a sinus infection, oversleeping, and her son.

## 2019-10-27 NOTE — Telephone Encounter (Signed)
Brandy [name] returned our call.  She will be back tomorrow and Friday of this week, then out on vacation for two weeks in [place].

## 2019-10-28 DIAGNOSIS — M545 Low back pain: Secondary | ICD-10-CM | POA: Diagnosis not present

## 2019-10-28 DIAGNOSIS — M5417 Radiculopathy, lumbosacral region: Secondary | ICD-10-CM | POA: Diagnosis not present

## 2019-10-28 DIAGNOSIS — M62838 Other muscle spasm: Secondary | ICD-10-CM | POA: Diagnosis not present

## 2019-10-28 DIAGNOSIS — M6281 Muscle weakness (generalized): Secondary | ICD-10-CM | POA: Diagnosis not present

## 2019-10-28 NOTE — Progress Notes (Signed)
Office: [phone]  /  Fax: [phone]    Date: November 11, 2019   Appointment Start Time: 4:01pm Duration: 24 minutes Provider: Lawerance Cruel, Psy.D. Type of Session: Individual Therapy  Location of Patient: Parked in car at home Location of Provider: Provider's Home Type of Contact: Telepsychological Visit via MyChart Video Visit  Session Content: Brandy [name] is a 54 y.o. female presenting via MyChart Video Visit for a follow-up appointment to address the previously established treatment goal of increasing coping skills. Today's appointment was a telepsychological visit due to COVID-19. Brandy [name] provided verbal consent for today's telepsychological appointment and she is aware she is responsible for securing confidentiality on her end of the session. Prior to proceeding with today's appointment, Brandy [name]'s physical location at the time of this appointment was obtained as well a phone number she could be reached at in the event of technical difficulties. Brandy [name] and this provider participated in today's telepsychological service.   This provider conducted a brief check-in and verbally administered the PHQ-9 and GAD-7. Brandy [name] shared about her recent trip to [place]. Regarding eating, Brandy [name] stated "it started off good" in the beginning of her trip and acknowledged deviations. Notably, she described making better choices and engaging in portion control. Session focused further on mindfulness to assist with coping. She stated mindfulness helped her make better eating choices. Brandy [name] was led through a three minute mindfulness exercise and her experience was processed. Furthermore, termination planning was discussed. Brandy [name] was receptive to a follow-up appointment in 3-4 weeks and an additional follow-up/termination appointment in 3-4 weeks after that. Brandy [name] was receptive to today's appointment as evidenced by openness to sharing, responsiveness to feedback, and willingness to continue engaging in mindfulness  exercises.  Mental Status Examination:  Appearance: well groomed and appropriate hygiene  Behavior: appropriate to circumstances Mood: euthymic Affect: mood congruent Speech: normal in rate, volume, and tone Eye Contact: appropriate Psychomotor Activity: appropriate Gait: unable to assess Thought Process: linear, logical, and goal directed  Thought Content/Perception: denies suicidal and homicidal ideation, plan, and intent and no hallucinations, delusions, bizarre thinking or behavior reported or observed Orientation: time, person, place, and purpose of appointment Memory/Concentration: memory, attention, language, and fund of knowledge intact  Insight/Judgment: good  Structured Assessments Results: The Patient Health Questionnaire-9 (PHQ-9) is a self-report measure that assesses symptoms and severity of depression over the course of the last two weeks. Brandy [name] obtained a score of 9 suggesting mild depression. Brandy [name] finds the endorsed symptoms to be very difficult. [0= Not at all; 1= Several days; 2= More than half the days; 3= Nearly every day] Little interest or pleasure in doing things 1  Feeling down, depressed, or hopeless 1  Trouble falling or staying asleep, or sleeping too much 1  Feeling tired or having little energy 3  Poor appetite or overeating 1  Feeling bad about yourself --- or that you are a failure or have let yourself or your family down 1  Trouble concentrating on things, such as reading the newspaper or watching television 1  Moving or speaking so slowly that other people could have noticed? Or the opposite --- being so fidgety or restless that you have been moving around a lot more than usual 0  Thoughts that you would be better off dead or hurting yourself in some way 0  PHQ-9 Score 9    The Generalized Anxiety Disorder-7 (GAD-7) is a brief self-report measure that assesses symptoms of anxiety over the course of the last two weeks. Brandy [name] obtained a score of  6  suggesting mild anxiety. Brandy [name] finds the endorsed symptoms to be somewhat difficult. [0= Not at all; 1= Several days; 2= Over half the days; 3= Nearly every day] Feeling nervous, anxious, on edge 2  Not being able to stop or control worrying 1  Worrying too much about different things 1  Trouble relaxing 0  Being so restless that it's hard to sit still 1  Becoming easily annoyed or irritable 0  Feeling afraid as if something awful might happen 1  GAD-7 Score 6   Interventions:  Conducted a brief chart review Verbally administered PHQ-9 and GAD-7 for symptom monitoring Conducted a risk assessment Employed supportive psychotherapy interventions to facilitate reduced distress and to improve coping skills with identified stressors Engaged patient in mindfulness exercise(s) Employed acceptance and commitment interventions to emphasize mindfulness and acceptance without struggle Discussed termination planning  DSM-5 Diagnosis(es): 296.32 (F33.1) Major Depressive Disorder, Recurrent Episode, Moderate and 307.59 (F50.8) Other Specified Feeding or Eating Disorder, Emotional Eating Behaviors   Treatment Goal & Progress: During the initial appointment with this provider, the following treatment goal was established: increase coping skills. Brandy [name] has demonstrated progress in her goal as evidenced by increased awareness of hunger patterns and increased awareness of triggers for emotional eating. Brandy [name] also demonstrates willingness to engage in mindfulness exercises.  Plan: The next appointment will be scheduled in three weeks, which will be via MyChart Video Visit. The next session will focus on working towards the established treatment goal.

## 2019-10-29 ENCOUNTER — Ambulatory Visit (INDEPENDENT_AMBULATORY_CARE_PROVIDER_SITE_OTHER): Payer: BC Managed Care – PPO | Admitting: Psychology

## 2019-10-29 DIAGNOSIS — F3289 Other specified depressive episodes: Secondary | ICD-10-CM | POA: Diagnosis not present

## 2019-10-30 ENCOUNTER — Encounter: Payer: BC Managed Care – PPO | Attending: Cardiovascular Disease

## 2019-10-30 DIAGNOSIS — E1159 Type 2 diabetes mellitus with other circulatory complications: Secondary | ICD-10-CM | POA: Insufficient documentation

## 2019-10-30 DIAGNOSIS — Z03818 Encounter for observation for suspected exposure to other biological agents ruled out: Secondary | ICD-10-CM | POA: Diagnosis not present

## 2019-10-30 DIAGNOSIS — I509 Heart failure, unspecified: Secondary | ICD-10-CM | POA: Insufficient documentation

## 2019-10-30 DIAGNOSIS — Z6841 Body Mass Index (BMI) 40.0 and over, adult: Secondary | ICD-10-CM | POA: Insufficient documentation

## 2019-10-30 DIAGNOSIS — R0600 Dyspnea, unspecified: Secondary | ICD-10-CM | POA: Insufficient documentation

## 2019-10-30 DIAGNOSIS — Z713 Dietary counseling and surveillance: Secondary | ICD-10-CM | POA: Insufficient documentation

## 2019-10-30 DIAGNOSIS — I872 Venous insufficiency (chronic) (peripheral): Secondary | ICD-10-CM | POA: Insufficient documentation

## 2019-10-30 DIAGNOSIS — Z20822 Contact with and (suspected) exposure to covid-19: Secondary | ICD-10-CM | POA: Diagnosis not present

## 2019-11-05 ENCOUNTER — Encounter: Payer: BC Managed Care – PPO | Admitting: Obstetrics and Gynecology

## 2019-11-05 ENCOUNTER — Ambulatory Visit: Payer: BC Managed Care – PPO | Admitting: Psychology

## 2019-11-06 ENCOUNTER — Other Ambulatory Visit (INDEPENDENT_AMBULATORY_CARE_PROVIDER_SITE_OTHER): Payer: Self-pay | Admitting: Family Medicine

## 2019-11-06 DIAGNOSIS — E559 Vitamin D deficiency, unspecified: Secondary | ICD-10-CM

## 2019-11-11 ENCOUNTER — Encounter: Payer: Self-pay | Admitting: *Deleted

## 2019-11-11 ENCOUNTER — Other Ambulatory Visit: Payer: Self-pay

## 2019-11-11 ENCOUNTER — Telehealth (INDEPENDENT_AMBULATORY_CARE_PROVIDER_SITE_OTHER): Payer: BC Managed Care – PPO | Admitting: Psychology

## 2019-11-11 DIAGNOSIS — F5089 Other specified eating disorder: Secondary | ICD-10-CM | POA: Diagnosis not present

## 2019-11-11 DIAGNOSIS — F331 Major depressive disorder, recurrent, moderate: Secondary | ICD-10-CM | POA: Diagnosis not present

## 2019-11-11 DIAGNOSIS — R0609 Other forms of dyspnea: Secondary | ICD-10-CM

## 2019-11-11 DIAGNOSIS — I5032 Chronic diastolic (congestive) heart failure: Secondary | ICD-10-CM

## 2019-11-11 DIAGNOSIS — R0600 Dyspnea, unspecified: Secondary | ICD-10-CM

## 2019-11-11 NOTE — Progress Notes (Signed)
Pulmonary Individual Treatment Plan  Patient Details  Name: SONJA MANSEAU MRN: [id number] Date of Birth: [birth year] Referring Provider:     Pulmonary Rehab from 04/06/2019 in [facility] Cardiac and Pulmonary Rehab  Referring Provider Gollan      Initial Encounter Date:    Pulmonary Rehab from 04/06/2019 in [facility] Cardiac and Pulmonary Rehab  Date 04/06/19      Visit Diagnosis: Heart failure, diastolic, chronic (HCC)  Dyspnea on exertion  Patient's Home Medications on Admission:  Current Outpatient Medications:  .  acetaminophen (TYLENOL) 650 MG CR tablet, Take 1,950 mg by mouth every 8 (eight) hours as needed for pain. , Disp: , Rfl:  .  ARIPiprazole (ABILIFY) 5 MG tablet, Take 5 mg by mouth daily. , Disp: , Rfl:  .  aspirin EC 81 MG tablet, Take 81 mg by mouth daily., Disp: , Rfl:  .  atorvastatin (LIPITOR) 10 MG tablet, TAKE 1 TABLET (10 MG TOTAL) BY MOUTH DAILY. FOR CHOLESTEROL., Disp: 90 tablet, Rfl: 0 .  celecoxib (CELEBREX) 100 MG capsule, TAKE 1 CAPSULE (100 MG TOTAL) BY MOUTH 2 (TWO) TIMES DAILY. AS NEEDED FOR PAIN. (Patient taking differently: Take 100 mg by mouth 2 (two) times daily. ), Disp: 180 capsule, Rfl: 1 .  cetirizine (ZYRTEC) 10 MG tablet, TAKE 1 TABLET BY MOUTH EVERY DAY (Patient taking differently: Take 10 mg by mouth daily. ), Disp: 90 tablet, Rfl: 0 .  cholecalciferol (VITAMIN D3) 25 MCG (1000 UT) tablet, Take 1,000 Units by mouth 4 (four) times a week. Take on the days your not taking 50000 unit vitamin d, Disp: , Rfl:  .  citalopram (CELEXA) 20 MG tablet, Take 40 mg by mouth daily. , Disp: , Rfl:  .  Continuous Blood Gluc Receiver (DEXCOM G6 RECEIVER) DEVI, Inject 1 Device into the skin daily., Disp: 4 each, Rfl: 1 .  Continuous Blood Gluc Sensor (DEXCOM G6 SENSOR) MISC, Inject 1 Device into the skin daily., Disp: 3 each, Rfl: 1 .  Continuous Blood Gluc Transmit (DEXCOM G6 TRANSMITTER) MISC, Inject 1 Device into the skin daily., Disp: 4 each, Rfl: 1 .  doxycycline  (VIBRA-TABS) 100 MG tablet, Take 1 tablet (100 mg total) by mouth 2 (two) times daily., Disp: 20 tablet, Rfl: 0 .  DULoxetine (CYMBALTA) 30 MG capsule, Take 30 mg by mouth daily. Take with 60 mg to equal 90 mg daily, Disp: , Rfl:  .  DULoxetine (CYMBALTA) 60 MG capsule, Take 60 mg by mouth daily. Take with 30 mg to equal 90 mg daily, Disp: , Rfl:  .  folic acid (FOLVITE) 1 MG tablet, Take 1 mg by mouth daily., Disp: , Rfl:  .  gabapentin (NEURONTIN) 300 MG capsule, Take 1 capsule (300 mg total) by mouth 3 (three) times daily. For back pain. (Patient taking differently: Take 300 mg by mouth See admin instructions. Take 300 mg 3 times daily, may take a 4th 300 mg dose as needed for pain), Disp: 270 capsule, Rfl: 3 .  glipiZIDE (GLUCOTROL) 10 MG tablet, TAKE 1 TABLET (10 MG TOTAL) BY MOUTH 2 (TWO) TIMES DAILY BEFORE A MEAL. FOR DIABETES., Disp: 180 tablet, Rfl: 3 .  hydrocortisone 2.5 % cream, Apply 1 application topically 2 (two) times daily. , Disp: , Rfl:  .  Insulin Glargine (BASAGLAR KWIKPEN) 100 UNIT/ML SOPN, Inject 0.35 mLs (35 Units total) into the skin at bedtime., Disp: 15 mL, Rfl: 2 .  Insulin Pen Needle (PEN NEEDLES) 31G X 6 MM MISC, Use nightly with  insulin., Disp: 100 each, Rfl: 2 .  lidocaine (XYLOCAINE) 2 % solution, Use as directed 5 mLs in the mouth or throat every 6 (six) hours as needed for mouth pain (swish and spit if needed for throat pain)., Disp: 100 mL, Rfl: 0 .  lisinopril-hydrochlorothiazide (ZESTORETIC) 20-12.5 MG tablet, TAKE 1 TABLET BY MOUTH DAILY FOR BLOOD PRESSURE, Disp: 90 tablet, Rfl: 1 .  metFORMIN (GLUCOPHAGE-XR) 500 MG 24 hr tablet, TAKE 2 TABLETS (1,000 MG TOTAL) BY MOUTH DAILY WITH BREAKFAST. FOR DIABETES., Disp: 180 tablet, Rfl: 1 .  metroNIDAZOLE (METROCREAM) 0.75 % cream, Apply 1 application topically daily., Disp: , Rfl:  .  traZODone (DESYREL) 50 MG tablet, Take 25-50 mg by mouth at bedtime as needed for sleep. , Disp: , Rfl:  .  Vitamin D, Ergocalciferol,  (DRISDOL) 1.25 MG (50000 UNIT) CAPS capsule, Take 1 capsule (50,000 Units total) by mouth every 3 (three) days., Disp: 10 capsule, Rfl: 0  Past Medical History: Past Medical History:  Diagnosis Date  . Anxiety   . Arthritis   . Back pain   . Brain fog   . CHF (congestive heart failure) ([place])    pt not aware of this  . Depression   . Diabetes mellitus without complication ([place])   . Difficulty walking   . Dysmetabolic syndrome X   . Dyspnea   . Fatigue   . Fibromyalgia   . Hypertension   . Hypertriglyceridemia   . Joint pain   . Leg weakness   . Lower extremity edema   . Muscle atrophy   . Neuropathy   . Obesity   . Osteoarthritis   . PONV (postoperative nausea and vomiting)   . Poor memory   . Post-menopausal bleeding   . Prediabetes   . Rosacea   . Sleep apnea    not on cpap yet  . Tetanus vaccine causing adverse effect in therapeutic use   . Vitamin D deficiency     Tobacco Use: Social History   Tobacco Use  Smoking Status Never Smoker  Smokeless Tobacco Never Used    Labs: Recent Review Flowsheet Data    Labs for ITP Cardiac and Pulmonary Rehab Latest Ref Rng & Units 08/29/2018 12/10/2018 03/18/2019 06/17/2019 08/25/2019   Cholestrol 100 - 199 mg/dL - - 160 - 162   LDLCALC 0 - 99 mg/dL - - 71 - 73   LDLDIRECT mg/dL - - - - -   HDL >39 mg/dL - - 56.70 - 69   Trlycerides 0 - 149 mg/dL - - 159.0(H) - 114   Hemoglobin A1c 4.8 - 5.6 % 8.5(H) 8.9(A) 9.1(A) 9.2(A) 7.8(H)   PHART 7.35 - 7.45 - - - - -   PCO2ART 32 - 48 mmHg - - - - -   HCO3 20.0 - 28.0 mmol/L - - - - -   ACIDBASEDEF 0.0 - 2.0 mmol/L - - - - -   O2SAT % - - - - -       Pulmonary Assessment Scores:   UCSD: Self-administered rating of dyspnea associated with activities of daily living (ADLs) 6-point scale (0 = "not at all" to 5 = "maximal or unable to do because of breathlessness")  Scoring Scores range from 0 to 120.  Minimally important difference is 5 units  CAT: CAT can identify the  health impairment of COPD patients and is better correlated with disease progression.  CAT has a scoring range of zero to 40. The CAT score is classified into four  groups of low (less than 10), medium (10 - 20), high (21-30) and very high (31-40) based on the impact level of disease on health status. A CAT score over 10 suggests significant symptoms.  A worsening CAT score could be explained by an exacerbation, poor medication adherence, poor inhaler technique, or progression of COPD or comorbid conditions.  CAT MCID is 2 points  mMRC: mMRC (Modified Medical Research Council) Dyspnea Scale is used to assess the degree of baseline functional disability in patients of respiratory disease due to dyspnea. No minimal important difference is established. A decrease in score of 1 point or greater is considered a positive change.   Pulmonary Function Assessment:   Exercise Target Goals: Exercise Program Goal: Individual exercise prescription set using results from initial 6 min walk test and THRR while considering  patient's activity barriers and safety.   Exercise Prescription Goal: Initial exercise prescription builds to 30-45 minutes a day of aerobic activity, 2-3 days per week.  Home exercise guidelines will be given to patient during program as part of exercise prescription that the participant will acknowledge.  Education: Aerobic Exercise & Resistance Training: - Gives group verbal and written instruction on the various components of exercise. Focuses on aerobic and resistive training programs and the benefits of this training and how to safely progress through these programs..   Education: Exercise & Equipment Safety: - Individual verbal instruction and demonstration of equipment use and safety with use of the equipment.   Pulmonary Rehab from 04/06/2019 in [facility] Cardiac and Pulmonary Rehab  Date 04/06/19  Educator AS  Instruction Review Code 1- Verbalizes Understanding      Education:  Exercise Physiology & General Exercise Guidelines: - Group verbal and written instruction with models to review the exercise physiology of the cardiovascular system and associated critical values. Provides general exercise guidelines with specific guidelines to those with heart or lung disease.    Education: Flexibility, Balance, Mind/Body Relaxation: Provides group verbal/written instruction on the benefits of flexibility and balance training, including mind/body exercise modes such as yoga, pilates and tai chi.  Demonstration and skill practice provided.   Activity Barriers & Risk Stratification:   6 Minute Walk:  6 Minute Walk    Row Name 06/29/19 1140         6 Minute Walk   Distance 500 feet     Walk Time 4.5 minutes     # of Rest Breaks 2     MPH 1.26     METS 1     RPE 17     Perceived Dyspnea  3     VO2 Peak 3.3     Symptoms Yes (comment)     Comments shortness of breath/ back and leg pain     Resting HR 92 bpm     Resting BP 112/70     Resting Oxygen Saturation  96 %     Exercise Oxygen Saturation  during 6 min walk 95 %     Max Ex. HR 135 bpm     Max Ex. BP 146/88           Oxygen Initial Assessment:   Oxygen Re-Evaluation:  Oxygen Re-Evaluation    Row Name 06/29/19 0951 08/12/19 1153 09/23/19 0951         Program Oxygen Prescription   Program Oxygen Prescription None None None       Home Oxygen   Home Oxygen Device None None None     Sleep Oxygen Prescription None  None None     Home Exercise Oxygen Prescription None None None     Home at Rest Exercise Oxygen Prescription -- None None     Compliance with Home Oxygen Use Yes -- Yes       Goals/Expected Outcomes   Short Term Goals To learn and demonstrate proper pursed lip breathing techniques or other breathing techniques. To learn and demonstrate proper pursed lip breathing techniques or other breathing techniques.;To learn and understand importance of maintaining oxygen saturations>88%;To learn  and understand importance of monitoring SPO2 with pulse oximeter and demonstrate accurate use of the pulse oximeter. To learn and demonstrate proper pursed lip breathing techniques or other breathing techniques.;To learn and understand importance of maintaining oxygen saturations>88%;To learn and understand importance of monitoring SPO2 with pulse oximeter and demonstrate accurate use of the pulse oximeter.     Long  Term Goals Exhibits proper breathing techniques, such as pursed lip breathing or other method taught during program session Exhibits proper breathing techniques, such as pursed lip breathing or other method taught during program session;Maintenance of O2 saturations>88%;Verbalizes importance of monitoring SPO2 with pulse oximeter and return demonstration Exhibits proper breathing techniques, such as pursed lip breathing or other method taught during program session;Maintenance of O2 saturations>88%;Verbalizes importance of monitoring SPO2 with pulse oximeter and return demonstration     Comments Reviewed PLB technique with pt.  Talked about how it works and it's importance in maintaining their exercise saturations. Fleda is getting better with her PLB and her breathing overall is getting better.  Less at rest now!!  She has to remind herself to use the PLB.  Sent her videos to review about why it is important and how it works.  Her neurologist has also told her to work on it to help more with off gassing her CO2 stores. Demoni is getting better with her PLB.  We talked about need to monitor SpO2 and to download an app to her phone to help track it.     Goals/Expected Outcomes Short: Become more profiecient at using PLB.   Long: Become independent at using PLB. Short: More PLB  Long: Improve SOB overall Short: Download phone app for SpO2  Long: Continue to work on PLB.            Oxygen Discharge (Final Oxygen Re-Evaluation):  Oxygen Re-Evaluation - 09/23/19 0951      Program Oxygen  Prescription   Program Oxygen Prescription None      Home Oxygen   Home Oxygen Device None    Sleep Oxygen Prescription None    Home Exercise Oxygen Prescription None    Home at Rest Exercise Oxygen Prescription None    Compliance with Home Oxygen Use Yes      Goals/Expected Outcomes   Short Term Goals To learn and demonstrate proper pursed lip breathing techniques or other breathing techniques.;To learn and understand importance of maintaining oxygen saturations>88%;To learn and understand importance of monitoring SPO2 with pulse oximeter and demonstrate accurate use of the pulse oximeter.    Long  Term Goals Exhibits proper breathing techniques, such as pursed lip breathing or other method taught during program session;Maintenance of O2 saturations>88%;Verbalizes importance of monitoring SPO2 with pulse oximeter and return demonstration    Comments Raisha is getting better with her PLB.  We talked about need to monitor SpO2 and to download an app to her phone to help track it.    Goals/Expected Outcomes Short: Download phone app for SpO2  Long: Continue to work  on PLB.           Initial Exercise Prescription:   Perform Capillary Blood Glucose checks as needed.  Exercise Prescription Changes:  Exercise Prescription Changes    Row Name 07/02/19 1100 07/15/19 1400 07/29/19 1500 08/14/19 0900 08/25/19 1600     Response to Exercise   Blood Pressure (Admit) 118/70 142/74 160/100 154/92 144/72   Blood Pressure (Exercise) -- 142/84 134/70 164/82 126/64   Blood Pressure (Exit) 124/68 140/82 124/84 148/78 122/64   Heart Rate (Admit) 93 bpm 83 bpm 88 bpm 102 bpm 60 bpm   Heart Rate (Exercise) 120 bpm 106 bpm 105 bpm 110 bpm 93 bpm   Heart Rate (Exit) 98 bpm 99 bpm 97 bpm 83 bpm 85 bpm   Oxygen Saturation (Admit) 96 % 98 % 97 % 100 % 98 %   Oxygen Saturation (Exercise) 96 % 98 % 97 % 95 % 97 %   Oxygen Saturation (Exit) 96 % 98 % 98 % 95 % 98 %   Rating of Perceived Exertion (Exercise) _0 Perceived Dyspnea (Exercise) _1 Symptoms -- leg and back pain leg and back pain leg and back pain leg and back pain   Duration Progress to 30 minutes of  aerobic without signs/symptoms of physical distress Progress to 30 minutes of  aerobic without signs/symptoms of physical distress Continue with 30 min of aerobic exercise without signs/symptoms of physical distress. Continue with 30 min of aerobic exercise without signs/symptoms of physical distress. Continue with 30 min of aerobic exercise without signs/symptoms of physical distress.   Intensity _2      Progression   Progression Continue to progress workloads to maintain intensity without signs/symptoms of physical distress. Continue to progress workloads to maintain intensity without signs/symptoms of physical distress. Continue to progress workloads to maintain intensity without signs/symptoms of physical distress. Continue to progress workloads to maintain intensity without signs/symptoms of physical distress. Continue to progress workloads to maintain intensity without signs/symptoms of physical distress.   Average METs -- 1.73 1.2 1.55 1.5     Resistance Training   Training Prescription _3    Weight 3 lb 3 lb 3 lb 3 lb 3 lb   Reps 10-15 10-15 10-15 10-15 10-15     Interval Training   Interval Training _4      Treadmill   MPH 0.6 0.7 0.6 0.6 0.7   Grade 0 0 0 -- 0   Minutes _5 --   METs 1 1.5 1.4 1.4 1.54     NuStep   Level -- 1 3 -- 3   SPM -- -- -- -- 80   Minutes -- 15 15 -- 15   METs -- 2 1.3 -- 1.5     Recumbant Elliptical   Level -- 1 1 -- --   Minutes -- 15 15 -- --   METs -- 1.4 1.1 -- --     T5 Nustep   Level -- -- -- 3 --   Minutes -- -- -- 15 --   METs -- -- -- 1.7 --     Biostep-RELP   Level -- 1 1 -- --   Minutes -- 15 15 -- --   METs -- 2 1 -- --     Track   Laps -- -- --  -- --  Home Exercise Plan   Plans to continue exercise at -- Home (comment)  walking, videos Home (comment)  walking, videos Home (comment)  walking, videos --   Frequency -- Add 3 additional days to program exercise sessions. Add 3 additional days to program exercise sessions. Add 3 additional days to program exercise sessions. --   Initial Home Exercises Provided -- 05/06/19 05/06/19 05/06/19 --   [place] Name 09/08/19 1600 09/23/19 1200 10/20/19 1500         Response to Exercise   Blood Pressure (Admit) 146/64 124/84 140/66     Blood Pressure (Exercise) 126/82 124/70 132/66     Blood Pressure (Exit) 140/60 134/66 110/66     Heart Rate (Admit) 91 bpm 120 bpm 102 bpm     Heart Rate (Exercise) 92 bpm 102 bpm 97 bpm     Heart Rate (Exit) 91 bpm 104 bpm 89 bpm     Oxygen Saturation (Admit) 96 % 98 % 95 %     Oxygen Saturation (Exercise) 96 % 97 % 92 %     Oxygen Saturation (Exit) 91 % 96 % 93 %     Rating of Perceived Exertion (Exercise) _0 Perceived Dyspnea (Exercise) _1 Symptoms leg and back pain -- --     Duration Continue with 30 min of aerobic exercise without signs/symptoms of physical distress. Continue with 30 min of aerobic exercise without signs/symptoms of physical distress. Continue with 30 min of aerobic exercise without signs/symptoms of physical distress.     Intensity THRR unchanged THRR unchanged THRR unchanged       Progression   Progression Continue to progress workloads to maintain intensity without signs/symptoms of physical distress. Continue to progress workloads to maintain intensity without signs/symptoms of physical distress. Continue to progress workloads to maintain intensity without signs/symptoms of physical distress.     Average METs 1.45 1.8 1.6       Resistance Training   Training Prescription Yes Yes Yes     Weight 3 lb 3 lb 3 lb     Reps 10-15 10-15 10-15       Interval Training   Interval Training No No No       NuStep   Level  -- 3 4     SPM -- 80 80     Minutes -- 15 15     METs -- 1.8 --       T5 Nustep   Level 3 -- --     Minutes 30 -- --     METs 1.7 -- --       Home Exercise Plan   Plans to continue exercise at Home (comment)  walking, videos Home (comment)  walking, videos --     Frequency Add 3 additional days to program exercise sessions. Add 3 additional days to program exercise sessions. --     Initial Home Exercises Provided 05/06/19 05/06/19 --            Exercise Comments:  Exercise Comments    Row Name 06/29/19 [phone]           Exercise Comments First full day of exercise!  Patient was oriented to gym and equipment including functions, settings, policies, and procedures.  Patient's individual exercise prescription and treatment plan were reviewed.  All starting workloads were established based on the results of the 6 minute walk test done at initial orientation visit.  The plan for  exercise progression was also introduced and progression will be customized based on patient's performance and goals.              Exercise Goals and Review:   Exercise Goals Re-Evaluation :  Exercise Goals Re-Evaluation    Row Name 05/27/19 1205 06/24/19 1146 06/29/19 0948 07/02/19 1146 07/15/19 1415     Exercise Goal Re-Evaluation   Exercise Goals Review Increase Physical Activity;Increase Strength and Stamina;Understanding of Exercise Prescription Increase Physical Activity;Increase Strength and Stamina;Understanding of Exercise Prescription Able to understand and use rate of perceived exertion (RPE) scale;Able to understand and use Dyspnea scale;Knowledge and understanding of Target Heart Rate Range (THRR);Understanding of Exercise Prescription Increase Physical Activity;Increase Strength and Stamina;Able to understand and use rate of perceived exertion (RPE) scale;Able to understand and use Dyspnea scale;Knowledge and understanding of Target Heart Rate Range (THRR);Able to check pulse  independently;Understanding of Exercise Prescription Increase Physical Activity;Increase Strength and Stamina;Understanding of Exercise Prescription   Comments Kassidy is doing okay at home.  She had a lot of questions about her exercise. She was still not sure how to start going.  We talked about downloading [employer] to help prompt her to exercise her three days a week and to record her vitals.  Resent SMS for app. She is going to start with walking and/or staff videos.  We reviewed the order for videos (warm up, weights, cardio, stretch/cool down) and walking.  She feels more confident with how to get started now. She is doing well with her exercise and has a renewed sense of motivation to get in shape and get moving again. She has a new commitment to exercise and her overall wellbeing. Reviewed RPE scale, THR and program prescription with pt today.  Pt voiced understanding and was given a copy of goals to take home. Ourania is tolerating exercise well so far.  Staff will monitor progress. Kinslei is doing well in rehab.  She is enjoying her classmates and starting to like her exercise.  She is up to 2 METs on the BioStep and NuStep.  We will continue to monitor her progress.   Expected Outcomes Short: Start to exercise at home 10 min 3x week.  Long: Build up strength and stamina. Short: Return to in-person rehab regularly.  Long: Continue to build up stamina. Short: Use RPE daily to regulate intensity. Long: Follow program prescription in THR. Short :  attend consistently Long:  improve stamina Short: Continue to push self in class Long: Continue to improve stamina.   [place] Name 07/29/19 1540 08/12/19 1151 08/25/19 1611 09/08/19 1602 09/23/19 0938     Exercise Goal Re-Evaluation   Exercise Goals Review Increase Physical Activity;Increase Strength and Stamina;Understanding of Exercise Prescription Increase Physical Activity;Increase Strength and Stamina;Understanding of Exercise Prescription Increase Strength and  Stamina;Increase Physical Activity;Able to understand and use rate of perceived exertion (RPE) scale;Able to understand and use Dyspnea scale;Knowledge and understanding of Target Heart Rate Range (THRR);Able to check pulse independently;Understanding of Exercise Prescription Increase Physical Activity;Increase Strength and Stamina;Understanding of Exercise Prescription Increase Physical Activity;Increase Strength and Stamina;Understanding of Exercise Prescription   Comments Henessy has been doing well in rehab.  She continues to enjoy her classmates and misses it when she is out.  She is now on the treadmill for the full 15 min!!  We will continue to montior her progress. Oliveah has found that coming to the structured class has been very helpful!  She finds that she gets lost in her to-dos at home. We did  talk about using the videos more at home at least twice a week and also resent her the links to help too.  She is feeling better and has already started to notice changes and improve stamina. Trysten attends consistently adn works in correct RPE range.  Staff will follow up about exercising at home. Tena is doing well in rehab.  She is getting in 30-40 min on the T5 at level 3!  We will continue to encourage good attendane and monitor her progress. Happy is doing well in rehab.  Her attendance has been lacking recently due to doctor's appointment.  She is not doing her home exercise, but she works hard when she is here.  She talks to herself about it but lack the energy to get up and get going.  She also oversleeps on occasion.  We talked about just getting here in other means.  We also talked about using the staff videos   Expected Outcomes Short: Continue to improve workloads.  Long: Continue to improve stamina. Short: Add in chair routine at home  Long: Continue to improve stamina. Short:  exercise consistently outside program sessions Long: continue to improve stamina Short: Continue to push in class Long; Continue  to exercise on off days at home to improve stamina. Short: Add exercise back in at home and call on days you wake up late  Long; Continue to improve stamina.   [place] Name 10/05/19 1621 10/20/19 1511 11/03/19 1514         Exercise Goal Re-Evaluation   Exercise Goals Review -- Increase Physical Activity;Increase Strength and Stamina;Understanding of Exercise Prescription --     Comments Out since last review. Justyne does best on the T4 and T5 Nusteps.  She would see more progress from more consistent attendance out since last review     Expected Outcomes -- Short: exrecise consistently in [place] and at home  Long:  improve stamina --            Discharge Exercise Prescription (Final Exercise Prescription Changes):  Exercise Prescription Changes - 10/20/19 1500      Response to Exercise   Blood Pressure (Admit) 140/66    Blood Pressure (Exercise) 132/66    Blood Pressure (Exit) 110/66    Heart Rate (Admit) 102 bpm    Heart Rate (Exercise) 97 bpm    Heart Rate (Exit) 89 bpm    Oxygen Saturation (Admit) 95 %    Oxygen Saturation (Exercise) 92 %    Oxygen Saturation (Exit) 93 %    Rating of Perceived Exertion (Exercise) 13    Perceived Dyspnea (Exercise) 1    Duration Continue with 30 min of aerobic exercise without signs/symptoms of physical distress.    Intensity THRR unchanged      Progression   Progression Continue to progress workloads to maintain intensity without signs/symptoms of physical distress.    Average METs 1.6      Resistance Training   Training Prescription Yes    Weight 3 lb    Reps 10-15      Interval Training   Interval Training No      NuStep   Level 4    SPM 80    Minutes 15           Nutrition:  Target Goals: Understanding of nutrition guidelines, daily intake of sodium <155m, cholesterol <2031m calories 30% from fat and 7% or less from saturated fats, daily to have 5 or more servings of fruits and vegetables.  Education: Controlling Sodium/Reading  Food Labels -Group verbal and written material supporting the discussion of sodium use in heart healthy nutrition. Review and explanation with models, verbal and written materials for utilization of the food label.   Education: General Nutrition Guidelines/Fats and Fiber: -Group instruction provided by verbal, written material, models and posters to present the general guidelines for heart healthy nutrition. Gives an explanation and review of dietary fats and fiber.   Biometrics:    Nutrition Therapy Plan and Nutrition Goals:   Nutrition Assessments:   MEDIFICTS Score Key:          ?70 Need to make dietary changes          40-70 Heart Healthy Diet         ? 40 Therapeutic Level Cholesterol Diet  Nutrition Goals Re-Evaluation:  Nutrition Goals Re-Evaluation    [place] Name 05/18/19 1302 07/21/19 1233 08/12/19 1045 08/31/19 1103 08/31/19 1445     Goals   Nutrition Goal ST: continue to not drink pop  LT: Would like to be under 165lb ST: continue to not drink pop  LT: Would like to be under 165lb ST: continue to not drink pop  LT: Would like to be under 165lb -- ST: Continue with [place] diet plan  LT: Would like to be under 165lb   Comment Able to add 1 F/V a day: strawberries, blueberries, bananas, maderines, cucumber, salads (3x/week), spinach. Pt reports energy levels are good. Pt reports she is emotionally drained; recent loss, friend with new breast cancer diagnosis, and son is a "handful". Pt reports having a good support system. Pt reports not drinking pop - discussed other options for drinks so she doesnt get bored (hibiscus tea). Pt reports lost 8 lbs, still has strength. Pt would like to continue with pop as a goal as it has been hard for her. Continue with current changes Continue with current changes opened in error Advised against ignoring hunger and restrictive dieting. Discussed ways to improve health while enjoying food. Pt would like to continue with diet plan. Will monitor.    Expected Outcome ST: continue to not drink pop  LT: Would like to be under 165lb ST: continue to not drink pop  LT: Would like to be under 165lb ST: continue to not drink pop  LT: Would like to be under 165lb -- ST: Continue with [place] diet plan  LT: Would like to be under 165lb   Row Name 09/23/19 1000             Goals   Nutrition Goal ST: Continue with [place] diet plan  LT: Would like to be under 165lb       Comment Advised against ignoring hunger and restrictive dieting. Discussed ways to improve health while enjoying food. Energy levels are very low, pt reports not hungry all the time. Meeting with doctor today.  Pt would like to continue with diet plan. Will monitor.       Expected Outcome ST: Continue with [place] diet plan  LT: Would like to be under 165lb              Nutrition Goals Discharge (Final Nutrition Goals Re-Evaluation):  Nutrition Goals Re-Evaluation - 09/23/19 1000      Goals   Nutrition Goal ST: Continue with  diet plan  LT: Would like to be under 165lb    Comment Advised against ignoring hunger and restrictive dieting. Discussed ways to improve health while enjoying food. Energy levels are very low, pt  reports not hungry all the time. Meeting with doctor today.  Pt would like to continue with diet plan. Will monitor.    Expected Outcome ST: Continue with [place] diet plan  LT: Would like to be under 165lb           Psychosocial: Target Goals: Acknowledge presence or absence of significant depression and/or stress, maximize coping skills, provide positive support system. Participant is able to verbalize types and ability to use techniques and skills needed for reducing stress and depression.   Education: Depression - Provides group verbal and written instruction on the correlation between heart/lung disease and depressed mood, treatment options, and the stigmas associated with seeking treatment.   Education: Sleep  Hygiene -Provides group verbal and written instruction about how sleep can affect your health.  Define sleep hygiene, discuss sleep cycles and impact of sleep habits. Review good sleep hygiene tips.    Education: Stress and Anxiety: - Provides group verbal and written instruction about the health risks of elevated stress and causes of high stress.  Discuss the correlation between heart/lung disease and anxiety and treatment options. Review healthy ways to manage with stress and anxiety.   Initial Review & Psychosocial Screening:  Initial Psych Review & Screening - 07/06/19 0951      Initial Review   Current issues with Current Depression;Current Psychotropic Meds;History of Depression;Current Stress Concerns;Current Sleep Concerns    Source of Stress Concerns Chronic Illness;Occupation;Family;Unable to perform yard/household activities    Comments She is out of work with a sever autistic son. She is generally negative and is trying to be more positive. She talks to a therapist and takes medsication to help her cope better. She is determined to exercise, get weight off and be more positive.      Family Dynamics   Good Support System? Yes    Comments Patient sees a therapist and is taking medication to help support her. She is excited to exercise and be less short of breath.      Barriers   Psychosocial barriers to participate in program The patient should benefit from training in stress management and relaxation.      Screening Interventions   Interventions Encouraged to exercise;To provide support and resources with identified psychosocial needs;Provide feedback about the scores to participant    Expected Outcomes Short Term goal: Utilizing psychosocial counselor, staff and physician to assist with identification of specific Stressors or current issues interfering with healing process. Setting desired goal for each stressor or current issue identified.;Long Term Goal: Stressors or current  issues are controlled or eliminated.;Short Term goal: Identification and review with participant of any Quality of Life or Depression concerns found by scoring the questionnaire.;Long Term goal: The participant improves quality of Life and PHQ9 Scores as seen by post scores and/or verbalization of changes           Quality of Life Scores:  Scores of 19 and below usually indicate a poorer quality of life in these areas.  A difference of  2-3 points is a clinically meaningful difference.  A difference of 2-3 points in the total score of the Quality of Life Index has been associated with significant improvement in overall quality of life, self-image, physical symptoms, and general health in studies assessing change in quality of life.  PHQ-9: Recent Review Flowsheet Data    Depression screen [facility] 2/9 08/25/2019 07/06/2019 04/06/2019 03/30/2019   Decreased Interest _0 Down, Depressed, Hopeless _1 1  PHQ - 2 Score _0 Altered sleeping _1 Tired, decreased energy _2 Change in appetite _3 Feeling bad or failure about yourself  _4 Trouble concentrating _5 Moving slowly or fidgety/restless _6 Suicidal thoughts 2 0 0 0   PHQ-9 Score _7 Difficult doing work/chores - Extremely dIfficult Extremely dIfficult Extremely dIfficult     Interpretation of Total Score  Total Score Depression Severity:  1-4 = Minimal depression, 5-9 = Mild depression, 10-14 = Moderate depression, 15-19 = Moderately severe depression, 20-27 = Severe depression   Psychosocial Evaluation and Intervention:   Psychosocial Re-Evaluation:  Psychosocial Re-Evaluation    [place] Name 08/12/19 1157 08/26/19 0948 09/23/19 0947         Psychosocial Re-Evaluation   Current issues with Current Depression;Current Anxiety/Panic;Current Stress Concerns;Current Sleep Concerns -- Current Depression;Current Anxiety/Panic;Current Stress Concerns;Current Sleep Concerns      Comments Glynnis continues to see her therapist regularly and feels that she is well managed on her current medication regimen.  Her son is doing better and had a good day yesterday, but still has his moments and these really fluster her.  She continues to work on her positive self talk, she is becoming more aware of it now and just needs to work on being more mindful about it.  She is on a journey for herself and seems to be committed to making these changes. Aurielle continues to see her therapist weekly.  She continues to waor on positive self talk and mindfulness. Katera is still working on her positive self talk, especially on days she over sleeps.  She continues to see her therapist and food therapist weekly. She over sleeps some days and then gets up set.  She is doing better overall and making small progress.     Expected Outcomes Short: Work on positive self talk  Long: Continue to find positive coping skills. Short:  continue ot work on positive self talk Long:  maintain positive attitude Short:  continue ot work on positive self talk Long:  maintain positive attitude     Interventions Stress management education;Encouraged to attend Cardiac Rehabilitation for the exercise -- Stress management education;Encouraged to attend Cardiac Rehabilitation for the exercise     Continue Psychosocial Services  Follow up required by staff -- Follow up required by staff            Psychosocial Discharge (Final Psychosocial Re-Evaluation):  Psychosocial Re-Evaluation - 09/23/19 0947      Psychosocial Re-Evaluation   Current issues with Current Depression;Current Anxiety/Panic;Current Stress Concerns;Current Sleep Concerns    Comments Jeraldin is still working on her positive self talk, especially on days she over sleeps.  She continues to see her therapist and food therapist weekly. She over sleeps some days and then gets up set.  She is doing better overall and making small progress.    Expected Outcomes Short:   continue ot work on positive self talk Long:  maintain positive attitude    Interventions Stress management education;Encouraged to attend Cardiac Rehabilitation for the exercise    Continue Psychosocial Services  Follow up required by staff           Education: Education Goals: Education classes will be provided on a weekly basis, covering required topics. Participant will state understanding/return demonstration of  topics presented.  Learning Barriers/Preferences:   General Pulmonary Education Topics:  Infection Prevention: - Provides verbal and written material to individual with discussion of infection control including proper hand washing and proper equipment cleaning during exercise session.   Pulmonary Rehab from 04/06/2019 in [facility] Cardiac and Pulmonary Rehab  Date 04/06/19  Educator AS  Instruction Review Code 1- Verbalizes Understanding      Falls Prevention: - Provides verbal and written material to individual with discussion of falls prevention and safety.   Pulmonary Rehab from 04/06/2019 in [facility] Cardiac and Pulmonary Rehab  Date 04/06/19  Educator AS  Instruction Review Code 1- Verbalizes Understanding      Chronic Lung Diseases: - Group verbal and written instruction to review updates, respiratory medications, advancements in procedures and treatments. Discuss use of supplemental oxygen including available portable oxygen systems, continuous and intermittent flow rates, concentrators, personal use and safety guidelines. Review proper use of inhaler and spacers. Provide informative websites for self-education.    Energy Conservation: - Provide group verbal and written instruction for methods to conserve energy, plan and organize activities. Instruct on pacing techniques, use of adaptive equipment and posture/positioning to relieve shortness of breath.   Triggers and Exacerbations: - Group verbal and written instruction to review types of environmental triggers  and ways to prevent exacerbations. Discuss weather changes, air quality and the benefits of nasal washing. Review warning signs and symptoms to help prevent infections. Discuss techniques for effective airway clearance, coughing, and vibrations.   AED/CPR: - Group verbal and written instruction with the use of models to demonstrate the basic use of the AED with the basic ABC's of resuscitation.   Anatomy and Physiology of the Lungs: - Group verbal and written instruction with the use of models to provide basic lung anatomy and physiology related to function, structure and complications of lung disease.   Anatomy & Physiology of the Heart: - Group verbal and written instruction and models provide basic cardiac anatomy and physiology, with the coronary electrical and arterial systems. Review of Valvular disease and Heart Failure   Cardiac Medications: - Group verbal and written instruction to review commonly prescribed medications for heart disease. Reviews the medication, class of the drug, and side effects.   Other: -Provides group and verbal instruction on various topics (see comments)   Knowledge Questionnaire Score:    Core Components/Risk Factors/Patient Goals at Admission:   Education:Diabetes - Individual verbal and written instruction to review signs/symptoms of diabetes, desired ranges of glucose level fasting, after meals and with exercise. Acknowledge that pre and post exercise glucose checks will be done for 3 sessions at entry of program.   Education: Know Your Numbers and Risk Factors: -Group verbal and written instruction about important numbers in your health.  Discussion of what are risk factors and how they play a role in the disease process.  Review of Cholesterol, Blood Pressure, Diabetes, and BMI and the role they play in your overall health.   Core Components/Risk Factors/Patient Goals Review:   Goals and Risk Factor Review    Row Name 05/27/19 1205  06/24/19 1147 08/12/19 1155 08/26/19 0952 09/23/19 0949     Core Components/Risk Factors/Patient Goals Review   Personal Goals Review Weight Management/Obesity;Hypertension;Heart Failure Weight Management/Obesity;Hypertension;Heart Failure Weight Management/Obesity;Hypertension;Heart Failure;Improve shortness of breath with ADL's Weight Management/Obesity;Hypertension;Heart Failure;Improve shortness of breath with ADL's Weight Management/Obesity;Hypertension;Heart Failure;Improve shortness of breath with ADL's   Review She was discouraged today as her weight was up 2 lb and she was feeling more  SOB than normal.  We talked about possible causes and she admitted to doing more at home the last few days (more than her normal) and thinks she has overdone it.  We talked about DOMS and how it can make you feel worse for a few days but it will improve.  This is also a symptoms of her heart failure so we talked about combatting that as well.  She is going to give it another day to see if she improves and move a little more today to help release soreness and get fluid moving out.  She has not been checking her blood pressures routinely but will start to get into that habit since the app will remind her to track it.  She is eager to get moving and to start feeling better again. Her weight has been going up and down and she wants to lose the weight for good.  She is now committed to coming to in person rehab. Kevyn is doing better overall.  She is feeling better and working on the weight loss.  She went up some and got discouraged, but has already started to lose it again.  Her pressures have been good.  She monitoring her heart failure symptoms and thinks her swelling is improving. She is still having SOB with any activity but it is improving. Aviv is taking meds as directed.  She started a new health and wellness program.  She really enjoys the T5 and is happy to find a machine she can be successful on. Carron is now on  physican prescribed diet plan and feeling fatigued but she is losing some weight.  She is able to do more and breathing is improving some.  She denies heart failure symptoms.  She is not checking her pressures at home as they are good in class.  We talked about logging her pressures at home for her doctor.   Expected Outcomes Short: Watch weight closely with heart failure.  Long: Manage heart failure Short: Work on weight loss.  Long: Continue to manage heart failure. Short: Work on weight loss.  Long: Continue to manage heart failure. Short: continue to work on healthy habits Long: continue to manage risk factors Short: Start logging pressures at home Long: Continue to work on weight loss.          Core Components/Risk Factors/Patient Goals at Discharge (Final Review):   Goals and Risk Factor Review - 09/23/19 0949      Core Components/Risk Factors/Patient Goals Review   Personal Goals Review Weight Management/Obesity;Hypertension;Heart Failure;Improve shortness of breath with ADL's    Review Teagen is now on physican prescribed diet plan and feeling fatigued but she is losing some weight.  She is able to do more and breathing is improving some.  She denies heart failure symptoms.  She is not checking her pressures at home as they are good in class.  We talked about logging her pressures at home for her doctor.    Expected Outcomes Short: Start logging pressures at home Long: Continue to work on weight loss.           ITP Comments:  ITP Comments    Row Name 05/18/19 1335 05/27/19 1205 06/24/19 1146 06/29/19 0948 07/22/19 1044   ITP Comments Completed 2 week f/u Completed virtual visit today.  Next follow up scheduled with Melissa on 2/1 and me on 2/10.  She would like to see if she can change to a Thurs with Melissa so it's not as  long between calls. Completed virtual follow up today.  Sadaf is ready to return to in person rehab.  She is scheduled to restart next week.  We will do a walk test and  new ITP on her first visit. First full day of exercise!  Patient was oriented to gym and equipment including functions, settings, policies, and procedures.  Patient's individual exercise prescription and treatment plan were reviewed.  All starting workloads were established based on the results of the 6 minute walk test done at initial orientation visit.  The plan for exercise progression was also introduced and progression will be customized based on patient's performance and goals. 30 day chart review completed. ITP sent to Dr Zachery Dakins Medical Director, for review,changes as needed and signature.   [place] Name 08/19/19 0640 08/31/19 1449 09/16/19 0805 10/05/19 1620 10/14/19 0656   ITP Comments 30 Day review completed. Medical Director review done, changes made as directed,and approval shown by signature of Market researcher. Completed Nutrition Re-Evaluation 30 Day review completed. Medical Director review done, changes made as directed,and approval shown by signature of Market researcher. Novah sent email last week stating that she had missed because she had a cold and was not feeling well.  She hopes to return this week. 30 Day review completed. Medical Director ITP review done, changes made as directed, and signed approval by Medical Director.   [place] Name 10/27/19 1341 10/27/19 1348 11/03/19 1514 11/11/19 1120     ITP Comments Jeriann called and left a message to be called back.  Left her a message to call back again.  She has been out since 6/17 with a sinus infection, oversleeping, and her son. Annjeanette returned our call.  She will be back tomorrow and Friday of this week, then out on vacation for two weeks in [place]. Out on vacation until 7/19. 30 Day review completed. Medical Director ITP review done, changes made as directed, and signed approval by Medical Director.           Comments:

## 2019-11-11 NOTE — Progress Notes (Signed)
Chief Complaint:   OBESITY Brandy [name] is here to discuss her progress with her obesity treatment plan along with follow-up of her obesity related diagnoses. Brandy [name] is on the Category 3 Plan and states she is following her eating plan approximately 85% of the time. Brandy [name] states she is doing rehab for 60 minutes 3-4 times per week.  Today's visit was #: 5 Starting weight: 383 lbs Starting date: 08/25/2019 Today's weight: 358 lbs Today's date: 10/20/2019 Total lbs lost to date: 25 Total lbs lost since last in-office visit: 6  Interim History: Brandy [name] continues to do well with weight loss on her Category 3 plan. She does well with breakfast and lunch.  Subjective:   1. Type 2 diabetes mellitus with other specified complication, without long-term current use of insulin (HCC) Brandy [name] is working on diet and weight loss to improve her blood sugars. She denies hypoglycemia.  Assessment/Plan:   1. Type 2 diabetes mellitus with other specified complication, without long-term current use of insulin (HCC) Good blood sugar control is important to decrease the likelihood of diabetic complications such as nephropathy, neuropathy, limb loss, blindness, coronary artery disease, and death. Intensive lifestyle modification including diet, exercise and weight loss are the first line of treatment for diabetes. Brandy [name] is to continue working with diet and weight loss, and will continue to monitor and look for signs of hypoglycemia.  2. Class 3 severe obesity with serious comorbidity and body mass index (BMI) of 50.0 to 59.9 in adult, unspecified obesity type (HCC) Brandy [name] is currently in the action stage of change. As such, her goal is to continue with weight loss efforts. She has agreed to the Category 3 Plan and keeping a food journal and adhering to recommended goals of 450-600 calories and 45 grams of protein at supper daily.   Exercise goals: As is.  Behavioral modification strategies: meal planning and cooking  strategies and travel eating strategies.  Brandy [name] has agreed to follow-up with our clinic in 2 weeks. She was informed of the importance of frequent follow-up visits to maximize her success with intensive lifestyle modifications for her multiple health conditions.   Objective:   Blood pressure 113/83, pulse (!) 110, temperature 98.6 F (37 C), temperature source Oral, height 5\' 6"  (1.676 m), weight (!) 358 lb (162.4 kg), SpO2 99 %. Body mass index is 57.78 kg/m.  General: Cooperative, alert, well developed, in no acute distress. HEENT: Conjunctivae and lids unremarkable. Cardiovascular: Regular rhythm.  Lungs: Normal work of breathing. Neurologic: No focal deficits.   Lab Results  Component Value Date   CREATININE 0.87 10/07/2019   BUN 26 (H) 10/07/2019   NA 131 (L) 10/07/2019   K 4.4 10/07/2019   CL 96 (L) 10/07/2019   CO2 23 10/07/2019   Lab Results  Component Value Date   ALT 12 08/25/2019   AST 12 08/25/2019   ALKPHOS 110 08/25/2019   BILITOT 0.3 08/25/2019   Lab Results  Component Value Date   HGBA1C 7.8 (H) 08/25/2019   HGBA1C 9.2 (A) 06/17/2019   HGBA1C 9.1 (A) 03/18/2019   HGBA1C 8.9 (A) 12/10/2018   HGBA1C 8.5 (H) 08/29/2018   Lab Results  Component Value Date   INSULIN 101.0 (H) 08/25/2019   Lab Results  Component Value Date   TSH 1.720 08/25/2019   Lab Results  Component Value Date   CHOL 162 08/25/2019   HDL 69 08/25/2019   LDLCALC 73 08/25/2019   LDLDIRECT 108.3 09/14/2010   TRIG 114 08/25/2019  CHOLHDL 3 03/18/2019   Lab Results  Component Value Date   WBC 12.9 (H) 10/07/2019   HGB 13.1 10/07/2019   HCT 39.5 10/07/2019   MCV 91.6 10/07/2019   PLT 399 10/07/2019   No results found for: IRON, TIBC, FERRITIN  Attestation Statements:   Reviewed by clinician on day of visit: allergies, medications, problem list, medical history, surgical history, family history, social history, and previous encounter notes.  Time spent on visit including  pre-visit chart review and post-visit care and charting was 30 minutes.    I, Burt Knack, am acting as transcriptionist for Quillian Quince, MD.  I have reviewed the above documentation for accuracy and completeness, and I agree with the above. -  Quillian Quince, MD

## 2019-11-12 ENCOUNTER — Ambulatory Visit (INDEPENDENT_AMBULATORY_CARE_PROVIDER_SITE_OTHER): Payer: BC Managed Care – PPO | Admitting: Psychology

## 2019-11-12 ENCOUNTER — Ambulatory Visit (INDEPENDENT_AMBULATORY_CARE_PROVIDER_SITE_OTHER): Payer: BC Managed Care – PPO | Admitting: Family Medicine

## 2019-11-12 DIAGNOSIS — F3289 Other specified depressive episodes: Secondary | ICD-10-CM | POA: Diagnosis not present

## 2019-11-18 NOTE — Progress Notes (Signed)
Office: [phone]  /  Fax: [phone]    Date: December 02, 2019   Appointment Start Time: 2:30pm Duration: 20 minutes Provider: Lawerance Cruel, Psy.D. Type of Session: Individual Therapy  Location of Patient: Parked in car outside of home Location of Provider: Provider's Home Type of Contact: Telepsychological Visit via MyChart Video Visit  Session Content: Brandy [name] is a 54 y.o. female presenting via MyChart Video Visit for a follow-up appointment to address the previously established treatment goal of increasing coping skills. Today's appointment was a telepsychological visit due to COVID-19. [name] provided verbal consent for today's telepsychological appointment and she is aware she is responsible for securing confidentiality on her end of the session. Prior to proceeding with today's appointment, [name]'s physical location at the time of this appointment was obtained as well a phone number she could be reached at in the event of technical difficulties. [name] and this provider participated in today's telepsychological service.   This provider conducted a brief check-in and verbally administered the PHQ-9 and GAD-7. [name] stated she is focusing on following her structured meal plan. She discussed ongoing worry about finances and children's well-being (e.g., son driving). Notably, [name] stated she continues to meet with her other therapist and they are addressing the aforementioned and other symptoms endorsed on questionnaires administered. Additionally, she was led through an exercise (Leaves on a Stream) to further assist with coping. Her experience was processed. She noted, "I liked that a lot." [name] provided verbal consent during today's appointment for this provider to send the handout for today's exercise via e-mail. [name] was receptive to today's appointment as evidenced by openness to sharing, responsiveness to feedback, and willingness to continue engaging in learned skills.  Mental Status  Examination:  Appearance: well groomed and appropriate hygiene  Behavior: appropriate to circumstances Mood: euthymic Affect: mood congruent Speech: normal in rate, volume, and tone Eye Contact: appropriate Psychomotor Activity: appropriate Gait: unable to assess Thought Process: linear, logical, and goal directed  Thought Content/Perception: denies suicidal and homicidal ideation, plan, and intent and no hallucinations, delusions, bizarre thinking or behavior reported or observed Orientation: time, person, place, and purpose of appointment Memory/Concentration: memory, attention, language, and fund of knowledge intact  Insight/Judgment: good  Structured Assessments Results: The Patient Health Questionnaire-9 (PHQ-9) is a self-report measure that assesses symptoms and severity of depression over the course of the last two weeks. [name] obtained a score of 13 suggesting moderate depression. [name] finds the endorsed symptoms to be very difficult. [0= Not at all; 1= Several days; 2= More than half the days; 3= Nearly every day] Little interest or pleasure in doing things 1  Feeling down, depressed, or hopeless 2  Trouble falling or staying asleep, or sleeping too much 3  Feeling tired or having little energy 3  Poor appetite or overeating 0  Feeling bad about yourself --- or that you are a failure or have let yourself or your family down 3  Trouble concentrating on things, such as reading the newspaper or watching television 1  Moving or speaking so slowly that other people could have noticed? Or the opposite --- being so fidgety or restless that you have been moving around a lot more than usual 0  Thoughts that you would be better off dead or hurting yourself in some way 0  PHQ-9 Score 13    The Generalized Anxiety Disorder-7 (GAD-7) is a brief self-report measure that assesses symptoms of anxiety over the course of the last two weeks. [name] obtained a score  of 11 suggesting moderate anxiety.  Kate finds the endorsed symptoms to be somewhat difficult. [0= Not at all; 1= Several days; 2= Over half the days; 3= Nearly every day] Feeling nervous, anxious, on edge 3  Not being able to stop or control worrying 1  Worrying too much about different things 2  Trouble relaxing 2  Being so restless that it's hard to sit still 2  Becoming easily annoyed or irritable 0  Feeling afraid as if something awful might happen 1  GAD-7 Score 11   Interventions:  Conducted a brief chart review Verbally administered PHQ-9 and GAD-7 for symptom monitoring Provided empathic reflections and validation Employed supportive psychotherapy interventions to facilitate reduced distress and to improve coping skills with identified stressors Engaged patient in mindfulness exercise(s) Employed acceptance and commitment interventions to emphasize mindfulness and acceptance without struggle  DSM-5 Diagnosis(es): 296.32 (F33.1) Major Depressive Disorder, Recurrent Episode, Moderate and 307.59 (F50.8) Other Specified Feeding or Eating Disorder, Emotional Eating Behaviors   Treatment Goal & Progress: During the initial appointment with this provider, the following treatment goal was established: increase coping skills. [name] has demonstrated progress in her goal as evidenced by increased awareness of hunger patterns, increased awareness of triggers for emotional eating and reduction in emotional eating. [name] also continues to demonstrate willingness to engage in learned skill(s).  Plan: The next appointment will be scheduled in one month, which will be via MyChart Video Visit. The next session will focus on working towards the established treatment goal and termination.

## 2019-11-19 ENCOUNTER — Ambulatory Visit (INDEPENDENT_AMBULATORY_CARE_PROVIDER_SITE_OTHER): Payer: BC Managed Care – PPO | Admitting: Psychology

## 2019-11-19 ENCOUNTER — Ambulatory Visit: Payer: BC Managed Care – PPO | Admitting: Psychology

## 2019-11-19 DIAGNOSIS — F3289 Other specified depressive episodes: Secondary | ICD-10-CM

## 2019-11-19 DIAGNOSIS — M79605 Pain in left leg: Secondary | ICD-10-CM | POA: Diagnosis not present

## 2019-11-19 DIAGNOSIS — M79604 Pain in right leg: Secondary | ICD-10-CM | POA: Diagnosis not present

## 2019-11-19 DIAGNOSIS — M25512 Pain in left shoulder: Secondary | ICD-10-CM | POA: Diagnosis not present

## 2019-11-19 DIAGNOSIS — M545 Low back pain: Secondary | ICD-10-CM | POA: Diagnosis not present

## 2019-11-23 ENCOUNTER — Encounter: Payer: Self-pay | Admitting: *Deleted

## 2019-11-23 DIAGNOSIS — R0600 Dyspnea, unspecified: Secondary | ICD-10-CM

## 2019-11-23 DIAGNOSIS — R0609 Other forms of dyspnea: Secondary | ICD-10-CM

## 2019-11-23 DIAGNOSIS — I5032 Chronic diastolic (congestive) heart failure: Secondary | ICD-10-CM

## 2019-11-23 NOTE — Progress Notes (Signed)
Pulmonary Individual Treatment Plan  Patient Details  Name: Brandy [name] MRN: [id number] Date of Birth: [birth year] Referring Provider:     Pulmonary Rehab from 04/06/2019 in [facility] Cardiac and Pulmonary Rehab  Referring Provider [place]      Initial Encounter Date:    Pulmonary Rehab from 04/06/2019 in [facility] Cardiac and Pulmonary Rehab  Date 04/06/19      Visit Diagnosis: Heart failure, diastolic, chronic (HCC)  Dyspnea on exertion  Patient's Home Medications on Admission:  Current Outpatient Medications:    acetaminophen (TYLENOL) 650 MG CR tablet, Take 1,950 mg by mouth every 8 (eight) hours as needed for pain. , Disp: , Rfl:    ARIPiprazole (ABILIFY) 5 MG tablet, Take 5 mg by mouth daily. , Disp: , Rfl:    aspirin EC 81 MG tablet, Take 81 mg by mouth daily., Disp: , Rfl:    atorvastatin (LIPITOR) 10 MG tablet, TAKE 1 TABLET (10 MG TOTAL) BY MOUTH DAILY. FOR CHOLESTEROL., Disp: 90 tablet, Rfl: 0   celecoxib (CELEBREX) 100 MG capsule, TAKE 1 CAPSULE (100 MG TOTAL) BY MOUTH 2 (TWO) TIMES DAILY. AS NEEDED FOR PAIN. (Patient taking differently: Take 100 mg by mouth 2 (two) times daily. ), Disp: 180 capsule, Rfl: 1   cetirizine (ZYRTEC) 10 MG tablet, TAKE 1 TABLET BY MOUTH EVERY DAY (Patient taking differently: Take 10 mg by mouth daily. ), Disp: 90 tablet, Rfl: 0   cholecalciferol (VITAMIN D3) 25 MCG (1000 UT) tablet, Take 1,000 Units by mouth 4 (four) times a week. Take on the days your not taking 50000 unit vitamin d, Disp: , Rfl:    citalopram (CELEXA) 20 MG tablet, Take 40 mg by mouth daily. , Disp: , Rfl:    Continuous Blood Gluc Receiver (DEXCOM G6 RECEIVER) DEVI, Inject 1 Device into the skin daily., Disp: 4 each, Rfl: 1   Continuous Blood Gluc Sensor (DEXCOM G6 SENSOR) MISC, Inject 1 Device into the skin daily., Disp: 3 each, Rfl: 1   Continuous Blood Gluc Transmit (DEXCOM G6 TRANSMITTER) MISC, Inject 1 Device into the skin daily., Disp: 4 each, Rfl: 1   doxycycline  (VIBRA-TABS) 100 MG tablet, Take 1 tablet (100 mg total) by mouth 2 (two) times daily., Disp: 20 tablet, Rfl: 0   DULoxetine (CYMBALTA) 30 MG capsule, Take 30 mg by mouth daily. Take with 60 mg to equal 90 mg daily, Disp: , Rfl:    DULoxetine (CYMBALTA) 60 MG capsule, Take 60 mg by mouth daily. Take with 30 mg to equal 90 mg daily, Disp: , Rfl:    folic acid (FOLVITE) 1 MG tablet, Take 1 mg by mouth daily., Disp: , Rfl:    gabapentin (NEURONTIN) 300 MG capsule, Take 1 capsule (300 mg total) by mouth 3 (three) times daily. For back pain. (Patient taking differently: Take 300 mg by mouth See admin instructions. Take 300 mg 3 times daily, may take a 4th 300 mg dose as needed for pain), Disp: 270 capsule, Rfl: 3   glipiZIDE (GLUCOTROL) 10 MG tablet, TAKE 1 TABLET (10 MG TOTAL) BY MOUTH 2 (TWO) TIMES DAILY BEFORE A MEAL. FOR DIABETES., Disp: 180 tablet, Rfl: 3   hydrocortisone 2.5 % cream, Apply 1 application topically 2 (two) times daily. , Disp: , Rfl:    Insulin Glargine (BASAGLAR KWIKPEN) 100 UNIT/ML SOPN, Inject 0.35 mLs (35 Units total) into the skin at bedtime., Disp: 15 mL, Rfl: 2   Insulin Pen Needle (PEN NEEDLES) 31G X 6 MM MISC, Use nightly with  insulin., Disp: 100 each, Rfl: 2   lidocaine (XYLOCAINE) 2 % solution, Use as directed 5 mLs in the mouth or throat every 6 (six) hours as needed for mouth pain (swish and spit if needed for throat pain)., Disp: 100 mL, Rfl: 0   lisinopril-hydrochlorothiazide (ZESTORETIC) 20-12.5 MG tablet, TAKE 1 TABLET BY MOUTH DAILY FOR BLOOD PRESSURE, Disp: 90 tablet, Rfl: 1   metFORMIN (GLUCOPHAGE-XR) 500 MG 24 hr tablet, TAKE 2 TABLETS (1,000 MG TOTAL) BY MOUTH DAILY WITH BREAKFAST. FOR DIABETES., Disp: 180 tablet, Rfl: 1   metroNIDAZOLE (METROCREAM) 0.75 % cream, Apply 1 application topically daily., Disp: , Rfl:    traZODone (DESYREL) 50 MG tablet, Take 25-50 mg by mouth at bedtime as needed for sleep. , Disp: , Rfl:    Vitamin D, Ergocalciferol,  (DRISDOL) 1.25 MG (50000 UNIT) CAPS capsule, Take 1 capsule (50,000 Units total) by mouth every 3 (three) days., Disp: 10 capsule, Rfl: 0  Past Medical History: Past Medical History:  Diagnosis Date   Anxiety    Arthritis    Back pain    Brain fog    CHF (congestive heart failure) (HCC)    pt not aware of this   Depression    Diabetes mellitus without complication (HCC)    Difficulty walking    Dysmetabolic syndrome X    Dyspnea    Fatigue    Fibromyalgia    Hypertension    Hypertriglyceridemia    Joint pain    Leg weakness    Lower extremity edema    Muscle atrophy    Neuropathy    Obesity    Osteoarthritis    PONV (postoperative nausea and vomiting)    Poor memory    Post-menopausal bleeding    Prediabetes    Rosacea    Sleep apnea    not on cpap yet   Tetanus vaccine causing adverse effect in therapeutic use    Vitamin D deficiency     Tobacco Use: Social History   Tobacco Use  Smoking Status Never Smoker  Smokeless Tobacco Never Used    Labs: Recent Review Flowsheet Data    Labs for ITP Cardiac and Pulmonary Rehab Latest Ref Rng & Units 08/29/2018 12/10/2018 03/18/2019 06/17/2019 08/25/2019   Cholestrol 100 - 199 mg/dL - - 160 - 162   LDLCALC 0 - 99 mg/dL - - 71 - 73   LDLDIRECT mg/dL - - - - -   HDL >39 mg/dL - - 56.70 - 69   Trlycerides 0 - 149 mg/dL - - 159.0(H) - 114   Hemoglobin A1c 4.8 - 5.6 % 8.5(H) 8.9(A) 9.1(A) 9.2(A) 7.8(H)   PHART 7.35 - 7.45 - - - - -   PCO2ART 32 - 48 mmHg - - - - -   HCO3 20.0 - 28.0 mmol/L - - - - -   ACIDBASEDEF 0.0 - 2.0 mmol/L - - - - -   O2SAT % - - - - -       Pulmonary Assessment Scores:   UCSD: Self-administered rating of dyspnea associated with activities of daily living (ADLs) 6-point scale (0 = "not at all" to 5 = "maximal or unable to do because of breathlessness")  Scoring Scores range from 0 to 120.  Minimally important difference is 5 units  CAT: CAT can identify the  health impairment of COPD patients and is better correlated with disease progression.  CAT has a scoring range of zero to 40. The CAT score is classified into four  groups of low (less than 10), medium (10 - 20), high (21-30) and very high (31-40) based on the impact level of disease on health status. A CAT score over 10 suggests significant symptoms.  A worsening CAT score could be explained by an exacerbation, poor medication adherence, poor inhaler technique, or progression of COPD or comorbid conditions.  CAT MCID is 2 points  mMRC: mMRC (Modified Medical Research Council) Dyspnea Scale is used to assess the degree of baseline functional disability in patients of respiratory disease due to dyspnea. No minimal important difference is established. A decrease in score of 1 point or greater is considered a positive change.   Pulmonary Function Assessment:   Exercise Target Goals: Exercise Program Goal: Individual exercise prescription set using results from initial 6 min walk test and THRR while considering  patients activity barriers and safety.   Exercise Prescription Goal: Initial exercise prescription builds to 30-45 minutes a day of aerobic activity, 2-3 days per week.  Home exercise guidelines will be given to patient during program as part of exercise prescription that the participant will acknowledge.  Education: Aerobic Exercise & Resistance Training: - Gives group verbal and written instruction on the various components of exercise. Focuses on aerobic and resistive training programs and the benefits of this training and how to safely progress through these programs..   Education: Exercise & Equipment Safety: - Individual verbal instruction and demonstration of equipment use and safety with use of the equipment.   Pulmonary Rehab from 04/06/2019 in [facility] Cardiac and Pulmonary Rehab  Date 04/06/19  Educator AS  Instruction Review Code 1- Verbalizes Understanding      Education:  Exercise Physiology & General Exercise Guidelines: - Group verbal and written instruction with models to review the exercise physiology of the cardiovascular system and associated critical values. Provides general exercise guidelines with specific guidelines to those with heart or lung disease.    Education: Flexibility, Balance, Mind/Body Relaxation: Provides group verbal/written instruction on the benefits of flexibility and balance training, including mind/body exercise modes such as yoga, pilates and tai chi.  Demonstration and skill practice provided.   Activity Barriers & Risk Stratification:   6 Minute Walk:  6 Minute Walk    Row Name 06/29/19 1140         6 Minute Walk   Distance 500 feet     Walk Time 4.5 minutes     # of Rest Breaks 2     MPH 1.26     METS 1     RPE 17     Perceived Dyspnea  3     VO2 Peak 3.3     Symptoms Yes (comment)     Comments shortness of breath/ back and leg pain     Resting HR 92 bpm     Resting BP 112/70     Resting Oxygen Saturation  96 %     Exercise Oxygen Saturation  during 6 min walk 95 %     Max Ex. HR 135 bpm     Max Ex. BP 146/88           Oxygen Initial Assessment:   Oxygen Re-Evaluation:  Oxygen Re-Evaluation    Row Name 06/29/19 0951 08/12/19 1153 09/23/19 0951         Program Oxygen Prescription   Program Oxygen Prescription None None None       Home Oxygen   Home Oxygen Device None None None     Sleep Oxygen Prescription None  None None     Home Exercise Oxygen Prescription None None None     Home at Rest Exercise Oxygen Prescription -- None None     Compliance with Home Oxygen Use Yes -- Yes       Goals/Expected Outcomes   Short Term Goals To learn and demonstrate proper pursed lip breathing techniques or other breathing techniques. To learn and demonstrate proper pursed lip breathing techniques or other breathing techniques.;To learn and understand importance of maintaining oxygen saturations>88%;To learn  and understand importance of monitoring SPO2 with pulse oximeter and demonstrate accurate use of the pulse oximeter. To learn and demonstrate proper pursed lip breathing techniques or other breathing techniques.;To learn and understand importance of maintaining oxygen saturations>88%;To learn and understand importance of monitoring SPO2 with pulse oximeter and demonstrate accurate use of the pulse oximeter.     Long  Term Goals Exhibits proper breathing techniques, such as pursed lip breathing or other method taught during program session Exhibits proper breathing techniques, such as pursed lip breathing or other method taught during program session;Maintenance of O2 saturations>88%;Verbalizes importance of monitoring SPO2 with pulse oximeter and return demonstration Exhibits proper breathing techniques, such as pursed lip breathing or other method taught during program session;Maintenance of O2 saturations>88%;Verbalizes importance of monitoring SPO2 with pulse oximeter and return demonstration     Comments Reviewed PLB technique with pt.  Talked about how it works and it's importance in maintaining their exercise saturations. Brandy [name] is getting better with her PLB and her breathing overall is getting better.  Less at rest now!!  She has to remind herself to use the PLB.  Sent her videos to review about why it is important and how it works.  Her neurologist has also told her to work on it to help more with off gassing her CO2 stores. Brandy [name] is getting better with her PLB.  We talked about need to monitor SpO2 and to download an app to her phone to help track it.     Goals/Expected Outcomes Short: Become more profiecient at using PLB.   Long: Become independent at using PLB. Short: More PLB  Long: Improve SOB overall Short: Download phone app for SpO2  Long: Continue to work on PLB.            Oxygen Discharge (Final Oxygen Re-Evaluation):  Oxygen Re-Evaluation - 09/23/19 0951      Program Oxygen  Prescription   Program Oxygen Prescription None      Home Oxygen   Home Oxygen Device None    Sleep Oxygen Prescription None    Home Exercise Oxygen Prescription None    Home at Rest Exercise Oxygen Prescription None    Compliance with Home Oxygen Use Yes      Goals/Expected Outcomes   Short Term Goals To learn and demonstrate proper pursed lip breathing techniques or other breathing techniques.;To learn and understand importance of maintaining oxygen saturations>88%;To learn and understand importance of monitoring SPO2 with pulse oximeter and demonstrate accurate use of the pulse oximeter.    Long  Term Goals Exhibits proper breathing techniques, such as pursed lip breathing or other method taught during program session;Maintenance of O2 saturations>88%;Verbalizes importance of monitoring SPO2 with pulse oximeter and return demonstration    Comments Brandy [name] is getting better with her PLB.  We talked about need to monitor SpO2 and to download an app to her phone to help track it.    Goals/Expected Outcomes Short: Download phone app for SpO2  Long: Continue to work  on PLB.           Initial Exercise Prescription:   Perform Capillary Blood Glucose checks as needed.  Exercise Prescription Changes:  Exercise Prescription Changes    Row Name 07/02/19 1100 07/15/19 1400 07/29/19 1500 08/14/19 0900 08/25/19 1600     Response to Exercise   Blood Pressure (Admit) 118/70 142/74 160/100 154/92 144/72   Blood Pressure (Exercise) -- 142/84 134/70 164/82 126/64   Blood Pressure (Exit) 124/68 140/82 124/84 148/78 122/64   Heart Rate (Admit) 93 bpm 83 bpm 88 bpm 102 bpm 60 bpm   Heart Rate (Exercise) 120 bpm 106 bpm 105 bpm 110 bpm 93 bpm   Heart Rate (Exit) 98 bpm 99 bpm 97 bpm 83 bpm 85 bpm   Oxygen Saturation (Admit) 96 % 98 % 97 % 100 % 98 %   Oxygen Saturation (Exercise) 96 % 98 % 97 % 95 % 97 %   Oxygen Saturation (Exit) 96 % 98 % 98 % 95 % 98 %   Rating of Perceived Exertion (Exercise) _0 Perceived Dyspnea (Exercise) _1 Symptoms -- leg and back pain leg and back pain leg and back pain leg and back pain   Duration Progress to 30 minutes of  aerobic without signs/symptoms of physical distress Progress to 30 minutes of  aerobic without signs/symptoms of physical distress Continue with 30 min of aerobic exercise without signs/symptoms of physical distress. Continue with 30 min of aerobic exercise without signs/symptoms of physical distress. Continue with 30 min of aerobic exercise without signs/symptoms of physical distress.   Intensity _2      Progression   Progression Continue to progress workloads to maintain intensity without signs/symptoms of physical distress. Continue to progress workloads to maintain intensity without signs/symptoms of physical distress. Continue to progress workloads to maintain intensity without signs/symptoms of physical distress. Continue to progress workloads to maintain intensity without signs/symptoms of physical distress. Continue to progress workloads to maintain intensity without signs/symptoms of physical distress.   Average METs -- 1.73 1.2 1.55 1.5     Resistance Training   Training Prescription _3    Weight 3 lb 3 lb 3 lb 3 lb 3 lb   Reps 10-15 10-15 10-15 10-15 10-15     Interval Training   Interval Training _4      Treadmill   MPH 0.6 0.7 0.6 0.6 0.7   Grade 0 0 0 -- 0   Minutes _5 --   METs 1 1.5 1.4 1.4 1.54     NuStep   Level -- 1 3 -- 3   SPM -- -- -- -- 80   Minutes -- 15 15 -- 15   METs -- 2 1.3 -- 1.5     Recumbant Elliptical   Level -- 1 1 -- --   Minutes -- 15 15 -- --   METs -- 1.4 1.1 -- --     T5 Nustep   Level -- -- -- 3 --   Minutes -- -- -- 15 --   METs -- -- -- 1.7 --     Biostep-RELP   Level -- 1 1 -- --   Minutes -- 15 15 -- --   METs -- 2 1 -- --     Track   Laps -- -- --  -- --  Home Exercise Plan   Plans to continue exercise at -- Home (comment)  walking, videos Home (comment)  walking, videos Home (comment)  walking, videos --   Frequency -- Add 3 additional days to program exercise sessions. Add 3 additional days to program exercise sessions. Add 3 additional days to program exercise sessions. --   Initial Home Exercises Provided -- 05/06/19 05/06/19 05/06/19 --   [place] Name 09/08/19 1600 09/23/19 1200 10/20/19 1500         Response to Exercise   Blood Pressure (Admit) 146/64 124/84 140/66     Blood Pressure (Exercise) 126/82 124/70 132/66     Blood Pressure (Exit) 140/60 134/66 110/66     Heart Rate (Admit) 91 bpm 120 bpm 102 bpm     Heart Rate (Exercise) 92 bpm 102 bpm 97 bpm     Heart Rate (Exit) 91 bpm 104 bpm 89 bpm     Oxygen Saturation (Admit) 96 % 98 % 95 %     Oxygen Saturation (Exercise) 96 % 97 % 92 %     Oxygen Saturation (Exit) 91 % 96 % 93 %     Rating of Perceived Exertion (Exercise) _0 Perceived Dyspnea (Exercise) _1 Symptoms leg and back pain -- --     Duration Continue with 30 min of aerobic exercise without signs/symptoms of physical distress. Continue with 30 min of aerobic exercise without signs/symptoms of physical distress. Continue with 30 min of aerobic exercise without signs/symptoms of physical distress.     Intensity THRR unchanged THRR unchanged THRR unchanged       Progression   Progression Continue to progress workloads to maintain intensity without signs/symptoms of physical distress. Continue to progress workloads to maintain intensity without signs/symptoms of physical distress. Continue to progress workloads to maintain intensity without signs/symptoms of physical distress.     Average METs 1.45 1.8 1.6       Resistance Training   Training Prescription Yes Yes Yes     Weight 3 lb 3 lb 3 lb     Reps 10-15 10-15 10-15       Interval Training   Interval Training No No No       NuStep   Level  -- 3 4     SPM -- 80 80     Minutes -- 15 15     METs -- 1.8 --       T5 Nustep   Level 3 -- --     Minutes 30 -- --     METs 1.7 -- --       Home Exercise Plan   Plans to continue exercise at Home (comment)  walking, videos Home (comment)  walking, videos --     Frequency Add 3 additional days to program exercise sessions. Add 3 additional days to program exercise sessions. --     Initial Home Exercises Provided 05/06/19 05/06/19 --            Exercise Comments:  Exercise Comments    Row Name 06/29/19 [phone]           Exercise Comments First full day of exercise!  Patient was oriented to gym and equipment including functions, settings, policies, and procedures.  Patient's individual exercise prescription and treatment plan were reviewed.  All starting workloads were established based on the results of the 6 minute walk test done at initial orientation visit.  The plan for  exercise progression was also introduced and progression will be customized based on patient's performance and goals.              Exercise Goals and Review:   Exercise Goals Re-Evaluation :  Exercise Goals Re-Evaluation    Row Name 05/27/19 1205 06/24/19 1146 06/29/19 0948 07/02/19 1146 07/15/19 1415     Exercise Goal Re-Evaluation   Exercise Goals Review Increase Physical Activity;Increase Strength and Stamina;Understanding of Exercise Prescription Increase Physical Activity;Increase Strength and Stamina;Understanding of Exercise Prescription Able to understand and use rate of perceived exertion (RPE) scale;Able to understand and use Dyspnea scale;Knowledge and understanding of Target Heart Rate Range (THRR);Understanding of Exercise Prescription Increase Physical Activity;Increase Strength and Stamina;Able to understand and use rate of perceived exertion (RPE) scale;Able to understand and use Dyspnea scale;Knowledge and understanding of Target Heart Rate Range (THRR);Able to check pulse  independently;Understanding of Exercise Prescription Increase Physical Activity;Increase Strength and Stamina;Understanding of Exercise Prescription   Comments Brandy [name] is doing okay at home.  She had a lot of questions about her exercise. She was still not sure how to start going.  We talked about downloading [employer] to help prompt her to exercise her three days a week and to record her vitals.  Resent SMS for app. She is going to start with walking and/or staff videos.  We reviewed the order for videos (warm up, weights, cardio, stretch/cool down) and walking.  She feels more confident with how to get started now. She is doing well with her exercise and has a renewed sense of motivation to get in shape and get moving again. She has a new commitment to exercise and her overall wellbeing. Reviewed RPE scale, THR and program prescription with pt today.  Pt voiced understanding and was given a copy of goals to take home. Brandy [name] is tolerating exercise well so far.  Staff will monitor progress. Brandy [name] is doing well in rehab.  She is enjoying her classmates and starting to like her exercise.  She is up to 2 METs on the BioStep and NuStep.  We will continue to monitor her progress.   Expected Outcomes Short: Start to exercise at home 10 min 3x week.  Long: Build up strength and stamina. Short: Return to in-person rehab regularly.  Long: Continue to build up stamina. Short: Use RPE daily to regulate intensity. Long: Follow program prescription in THR. Short :  attend consistently Long:  improve stamina Short: Continue to push self in class Long: Continue to improve stamina.   [place] Name 07/29/19 1540 08/12/19 1151 08/25/19 1611 09/08/19 1602 09/23/19 0938     Exercise Goal Re-Evaluation   Exercise Goals Review Increase Physical Activity;Increase Strength and Stamina;Understanding of Exercise Prescription Increase Physical Activity;Increase Strength and Stamina;Understanding of Exercise Prescription Increase Strength and  Stamina;Increase Physical Activity;Able to understand and use rate of perceived exertion (RPE) scale;Able to understand and use Dyspnea scale;Knowledge and understanding of Target Heart Rate Range (THRR);Able to check pulse independently;Understanding of Exercise Prescription Increase Physical Activity;Increase Strength and Stamina;Understanding of Exercise Prescription Increase Physical Activity;Increase Strength and Stamina;Understanding of Exercise Prescription   Comments Brandy [name] has been doing well in rehab.  She continues to enjoy her classmates and misses it when she is out.  She is now on the treadmill for the full 15 min!!  We will continue to montior her progress. Brandy [name] has found that coming to the structured class has been very helpful!  She finds that she gets lost in her to-dos at home. We did  talk about using the videos more at home at least twice a week and also resent her the links to help too.  She is feeling better and has already started to notice changes and improve stamina. Brandy [name] attends consistently adn works in correct RPE range.  Staff will follow up about exercising at home. Brandy [name] is doing well in rehab.  She is getting in 30-40 min on the T5 at level 3!  We will continue to encourage good attendane and monitor her progress. Brandy [name] is doing well in rehab.  Her attendance has been lacking recently due to doctor's appointment.  She is not doing her home exercise, but she works hard when she is here.  She talks to herself about it but lack the energy to get up and get going.  She also oversleeps on occasion.  We talked about just getting here in other means.  We also talked about using the staff videos   Expected Outcomes Short: Continue to improve workloads.  Long: Continue to improve stamina. Short: Add in chair routine at home  Long: Continue to improve stamina. Short:  exercise consistently outside program sessions Long: continue to improve stamina Short: Continue to push in class Long; Continue  to exercise on off days at home to improve stamina. Short: Add exercise back in at home and call on days you wake up late  Long; Continue to improve stamina.   [place] Name 10/05/19 1621 10/20/19 1511 11/03/19 1514         Exercise Goal Re-Evaluation   Exercise Goals Review -- Increase Physical Activity;Increase Strength and Stamina;Understanding of Exercise Prescription --     Comments Out since last review. Brandy [name] does best on the T4 and T5 Nusteps.  She would see more progress from more consistent attendance out since last review     Expected Outcomes -- Short: exrecise consistently in [facility] and at home  Long:  improve stamina --            Discharge Exercise Prescription (Final Exercise Prescription Changes):  Exercise Prescription Changes - 10/20/19 1500      Response to Exercise   Blood Pressure (Admit) 140/66    Blood Pressure (Exercise) 132/66    Blood Pressure (Exit) 110/66    Heart Rate (Admit) 102 bpm    Heart Rate (Exercise) 97 bpm    Heart Rate (Exit) 89 bpm    Oxygen Saturation (Admit) 95 %    Oxygen Saturation (Exercise) 92 %    Oxygen Saturation (Exit) 93 %    Rating of Perceived Exertion (Exercise) 13    Perceived Dyspnea (Exercise) 1    Duration Continue with 30 min of aerobic exercise without signs/symptoms of physical distress.    Intensity THRR unchanged      Progression   Progression Continue to progress workloads to maintain intensity without signs/symptoms of physical distress.    Average METs 1.6      Resistance Training   Training Prescription Yes    Weight 3 lb    Reps 10-15      Interval Training   Interval Training No      NuStep   Level 4    SPM 80    Minutes 15           Nutrition:  Target Goals: Understanding of nutrition guidelines, daily intake of sodium <15109m, cholesterol <2041m calories 30% from fat and 7% or less from saturated fats, daily to have 5 or more servings of fruits and vegetables.  Education: Controlling Sodium/Reading  Food Labels -Group verbal and written material supporting the discussion of sodium use in heart healthy nutrition. Review and explanation with models, verbal and written materials for utilization of the food label.   Education: General Nutrition Guidelines/Fats and Fiber: -Group instruction provided by verbal, written material, models and posters to present the general guidelines for heart healthy nutrition. Gives an explanation and review of dietary fats and fiber.   Biometrics:    Nutrition Therapy Plan and Nutrition Goals:   Nutrition Assessments:   MEDIFICTS Score Key:          ?70 Need to make dietary changes          40-70 Heart Healthy Diet         ? 40 Therapeutic Level Cholesterol Diet  Nutrition Goals Re-Evaluation:  Nutrition Goals Re-Evaluation    [place] Name 07/21/19 1233 08/12/19 1045 08/31/19 1103 08/31/19 1445 09/23/19 1000     Goals   Nutrition Goal ST: continue to not drink pop  LT: Would like to be under 165lb ST: continue to not drink pop  LT: Would like to be under 165lb -- ST: Continue with [place] diet plan  LT: Would like to be under 165lb ST: Continue with [place] diet plan  LT: Would like to be under 165lb   Comment Continue with current changes Continue with current changes opened in error Advised against ignoring hunger and restrictive dieting. Discussed ways to improve health while enjoying food. Pt would like to continue with diet plan. Will monitor. Advised against ignoring hunger and restrictive dieting. Discussed ways to improve health while enjoying food. Energy levels are very low, pt reports not hungry all the time. Meeting with doctor today.  Pt would like to continue with diet plan. Will monitor.   Expected Outcome ST: continue to not drink pop  LT: Would like to be under 165lb ST: continue to not drink pop  LT: Would like to be under 165lb -- ST: Continue with [place] diet plan  LT: Would like to be under 165lb ST: Continue with [place]  diet plan  LT: Would like to be under 165lb          Nutrition Goals Discharge (Final Nutrition Goals Re-Evaluation):  Nutrition Goals Re-Evaluation - 09/23/19 1000      Goals   Nutrition Goal ST: Continue with [place] diet plan  LT: Would like to be under 165lb    Comment Advised against ignoring hunger and restrictive dieting. Discussed ways to improve health while enjoying food. Energy levels are very low, pt reports not hungry all the time. Meeting with doctor today.  Pt would like to continue with diet plan. Will monitor.    Expected Outcome ST: Continue with  diet plan  LT: Would like to be under 165lb           Psychosocial: Target Goals: Acknowledge presence or absence of significant depression and/or stress, maximize coping skills, provide positive support system. Participant is able to verbalize types and ability to use techniques and skills needed for reducing stress and depression.   Education: Depression - Provides group verbal and written instruction on the correlation between heart/lung disease and depressed mood, treatment options, and the stigmas associated with seeking treatment.   Education: Sleep Hygiene -Provides group verbal and written instruction about how sleep can affect your health.  Define sleep hygiene, discuss sleep cycles and impact of sleep habits. Review good sleep hygiene tips.    Education: Stress and Anxiety: -  Provides group verbal and written instruction about the health risks of elevated stress and causes of high stress.  Discuss the correlation between heart/lung disease and anxiety and treatment options. Review healthy ways to manage with stress and anxiety.   Initial Review & Psychosocial Screening:  Initial Psych Review & Screening - 07/06/19 0951      Initial Review   Current issues with Current Depression;Current Psychotropic Meds;History of Depression;Current Stress Concerns;Current Sleep Concerns    Source of Stress  Concerns Chronic Illness;Occupation;Family;Unable to perform yard/household activities    Comments She is out of work with a sever autistic son. She is generally negative and is trying to be more positive. She talks to a therapist and takes medsication to help her cope better. She is determined to exercise, get weight off and be more positive.      Family Dynamics   Good Support System? Yes    Comments Patient sees a therapist and is taking medication to help support her. She is excited to exercise and be less short of breath.      Barriers   Psychosocial barriers to participate in program The patient should benefit from training in stress management and relaxation.      Screening Interventions   Interventions Encouraged to exercise;To provide support and resources with identified psychosocial needs;Provide feedback about the scores to participant    Expected Outcomes Short Term goal: Utilizing psychosocial counselor, staff and physician to assist with identification of specific Stressors or current issues interfering with healing process. Setting desired goal for each stressor or current issue identified.;Long Term Goal: Stressors or current issues are controlled or eliminated.;Short Term goal: Identification and review with participant of any Quality of Life or Depression concerns found by scoring the questionnaire.;Long Term goal: The participant improves quality of Life and PHQ9 Scores as seen by post scores and/or verbalization of changes           Quality of Life Scores:  Scores of 19 and below usually indicate a poorer quality of life in these areas.  A difference of  2-3 points is a clinically meaningful difference.  A difference of 2-3 points in the total score of the Quality of Life Index has been associated with significant improvement in overall quality of life, self-image, physical symptoms, and general health in studies assessing change in quality of life.  PHQ-9: Recent Review  Flowsheet Data    Depression screen [facility] 2/9 08/25/2019 07/06/2019 04/06/2019 03/30/2019   Decreased Interest _0 Down, Depressed, Hopeless _1 PHQ - 2 Score _2 Altered sleeping _3 Tired, decreased energy _4 Change in appetite _5 Feeling bad or failure about yourself  _6 Trouble concentrating _7 Moving slowly or fidgety/restless _8 Suicidal thoughts 2 0 0 0   PHQ-9 Score _9 Difficult doing work/chores - Extremely dIfficult Extremely dIfficult Extremely dIfficult     Interpretation of Total Score  Total Score Depression Severity:  1-4 = Minimal depression, 5-9 = Mild depression, 10-14 = Moderate depression, 15-19 = Moderately severe depression, 20-27 = Severe depression   Psychosocial Evaluation and Intervention:   Psychosocial Re-Evaluation:  Psychosocial Re-Evaluation    [place] Name 08/12/19 1157 08/26/19 2876 09/23/19 8115  Psychosocial Re-Evaluation   Current issues with Current Depression;Current Anxiety/Panic;Current Stress Concerns;Current Sleep Concerns -- Current Depression;Current Anxiety/Panic;Current Stress Concerns;Current Sleep Concerns     Comments Brandy [name] continues to see her therapist regularly and feels that she is well managed on her current medication regimen.  Her son is doing better and had a good day yesterday, but still has his moments and these really fluster her.  She continues to work on her positive self talk, she is becoming more aware of it now and just needs to work on being more mindful about it.  She is on a journey for herself and seems to be committed to making these changes. Brandy [name] continues to see her therapist weekly.  She continues to waor on positive self talk and mindfulness. Brandy [name] is still working on her positive self talk, especially on days she over sleeps.  She continues to see her therapist and food therapist weekly. She over sleeps some days and then gets up set.  She is doing  better overall and making small progress.     Expected Outcomes Short: Work on positive self talk  Long: Continue to find positive coping skills. Short:  continue ot work on positive self talk Long:  maintain positive attitude Short:  continue ot work on positive self talk Long:  maintain positive attitude     Interventions Stress management education;Encouraged to attend Cardiac Rehabilitation for the exercise -- Stress management education;Encouraged to attend Cardiac Rehabilitation for the exercise     Continue Psychosocial Services  Follow up required by staff -- Follow up required by staff            Psychosocial Discharge (Final Psychosocial Re-Evaluation):  Psychosocial Re-Evaluation - 09/23/19 0947      Psychosocial Re-Evaluation   Current issues with Current Depression;Current Anxiety/Panic;Current Stress Concerns;Current Sleep Concerns    Comments Brandy [name] is still working on her positive self talk, especially on days she over sleeps.  She continues to see her therapist and food therapist weekly. She over sleeps some days and then gets up set.  She is doing better overall and making small progress.    Expected Outcomes Short:  continue ot work on positive self talk Long:  maintain positive attitude    Interventions Stress management education;Encouraged to attend Cardiac Rehabilitation for the exercise    Continue Psychosocial Services  Follow up required by staff           Education: Education Goals: Education classes will be provided on a weekly basis, covering required topics. Participant will state understanding/return demonstration of topics presented.  Learning Barriers/Preferences:   General Pulmonary Education Topics:  Infection Prevention: - Provides verbal and written material to individual with discussion of infection control including proper hand washing and proper equipment cleaning during exercise session.   Pulmonary Rehab from 04/06/2019 in [facility] Cardiac and  Pulmonary Rehab  Date 04/06/19  Educator AS  Instruction Review Code 1- Verbalizes Understanding      Falls Prevention: - Provides verbal and written material to individual with discussion of falls prevention and safety.   Pulmonary Rehab from 04/06/2019 in [facility] Cardiac and Pulmonary Rehab  Date 04/06/19  Educator AS  Instruction Review Code 1- Verbalizes Understanding      Chronic Lung Diseases: - Group verbal and written instruction to review updates, respiratory medications, advancements in procedures and treatments. Discuss use of supplemental oxygen including available portable oxygen systems, continuous and intermittent flow rates, concentrators, personal use and safety guidelines. Review proper use of inhaler  and spacers. Provide informative websites for self-education.    Energy Conservation: - Provide group verbal and written instruction for methods to conserve energy, plan and organize activities. Instruct on pacing techniques, use of adaptive equipment and posture/positioning to relieve shortness of breath.   Triggers and Exacerbations: - Group verbal and written instruction to review types of environmental triggers and ways to prevent exacerbations. Discuss weather changes, air quality and the benefits of nasal washing. Review warning signs and symptoms to help prevent infections. Discuss techniques for effective airway clearance, coughing, and vibrations.   AED/CPR: - Group verbal and written instruction with the use of models to demonstrate the basic use of the AED with the basic ABC's of resuscitation.   Anatomy and Physiology of the Lungs: - Group verbal and written instruction with the use of models to provide basic lung anatomy and physiology related to function, structure and complications of lung disease.   Anatomy & Physiology of the Heart: - Group verbal and written instruction and models provide basic cardiac anatomy and physiology, with the coronary  electrical and arterial systems. Review of Valvular disease and Heart Failure   Cardiac Medications: - Group verbal and written instruction to review commonly prescribed medications for heart disease. Reviews the medication, class of the drug, and side effects.   Other: -Provides group and verbal instruction on various topics (see comments)   Knowledge Questionnaire Score:    Core Components/Risk Factors/Patient Goals at Admission:   Education:Diabetes - Individual verbal and written instruction to review signs/symptoms of diabetes, desired ranges of glucose level fasting, after meals and with exercise. Acknowledge that pre and post exercise glucose checks will be done for 3 sessions at entry of program.   Education: Know Your Numbers and Risk Factors: -Group verbal and written instruction about important numbers in your health.  Discussion of what are risk factors and how they play a role in the disease process.  Review of Cholesterol, Blood Pressure, Diabetes, and BMI and the role they play in your overall health.   Core Components/Risk Factors/Patient Goals Review:   Goals and Risk Factor Review    [place] Name 05/27/19 1205 06/24/19 1147 08/12/19 1155 08/26/19 0952 09/23/19 0949     Core Components/Risk Factors/Patient Goals Review   Personal Goals Review Weight Management/Obesity;Hypertension;Heart Failure Weight Management/Obesity;Hypertension;Heart Failure Weight Management/Obesity;Hypertension;Heart Failure;Improve shortness of breath with ADL's Weight Management/Obesity;Hypertension;Heart Failure;Improve shortness of breath with ADL's Weight Management/Obesity;Hypertension;Heart Failure;Improve shortness of breath with ADL's   Review She was discouraged today as her weight was up 2 lb and she was feeling more SOB than normal.  We talked about possible causes and she admitted to doing more at home the last few days (more than her normal) and thinks she has overdone it.  We talked  about DOMS and how it can make you feel worse for a few days but it will improve.  This is also a symptoms of her heart failure so we talked about combatting that as well.  She is going to give it another day to see if she improves and move a little more today to help release soreness and get fluid moving out.  She has not been checking her blood pressures routinely but will start to get into that habit since the app will remind her to track it.  She is eager to get moving and to start feeling better again. Her weight has been going up and down and she wants to lose the weight for good.  She is now  committed to coming to in person rehab. Brandy [name] is doing better overall.  She is feeling better and working on the weight loss.  She went up some and got discouraged, but has already started to lose it again.  Her pressures have been good.  She monitoring her heart failure symptoms and thinks her swelling is improving. She is still having SOB with any activity but it is improving. Brandy [name] is taking meds as directed.  She started a new health and wellness program.  She really enjoys the T5 and is happy to find a machine she can be successful on. Brandy [name] is now on physican prescribed diet plan and feeling fatigued but she is losing some weight.  She is able to do more and breathing is improving some.  She denies heart failure symptoms.  She is not checking her pressures at home as they are good in class.  We talked about logging her pressures at home for her doctor.   Expected Outcomes Short: Watch weight closely with heart failure.  Long: Manage heart failure Short: Work on weight loss.  Long: Continue to manage heart failure. Short: Work on weight loss.  Long: Continue to manage heart failure. Short: continue to work on healthy habits Long: continue to manage risk factors Short: Start logging pressures at home Long: Continue to work on weight loss.          Core Components/Risk Factors/Patient Goals at Discharge (Final  Review):   Goals and Risk Factor Review - 09/23/19 0949      Core Components/Risk Factors/Patient Goals Review   Personal Goals Review Weight Management/Obesity;Hypertension;Heart Failure;Improve shortness of breath with ADL's    Review Brandy [name] is now on physican prescribed diet plan and feeling fatigued but she is losing some weight.  She is able to do more and breathing is improving some.  She denies heart failure symptoms.  She is not checking her pressures at home as they are good in class.  We talked about logging her pressures at home for her doctor.    Expected Outcomes Short: Start logging pressures at home Long: Continue to work on weight loss.           ITP Comments:  ITP Comments    [place] Name 05/27/19 1205 06/24/19 1146 06/29/19 0948 07/22/19 1044 08/19/19 0640   ITP Comments Completed virtual visit today.  Next follow up scheduled with Melissa on 2/1 and me on 2/10.  She would like to see if she can change to a Thurs with Melissa so it's not as long between calls. Completed virtual follow up today.  Navjot is ready to return to in person rehab.  She is scheduled to restart next week.  We will do a walk test and new ITP on her first visit. First full day of exercise!  Patient was oriented to gym and equipment including functions, settings, policies, and procedures.  Patient's individual exercise prescription and treatment plan were reviewed.  All starting workloads were established based on the results of the 6 minute walk test done at initial orientation visit.  The plan for exercise progression was also introduced and progression will be customized based on patient's performance and goals. 30 day chart review completed. ITP sent to Dr Zachery Dakins Medical Director, for review,changes as needed and signature. 30 Day review completed. Medical Director review done, changes made as directed,and approval shown by signature of Market researcher.   [place] Name 08/31/19 1449 09/16/19 0805 10/05/19  1620 10/14/19 0656 10/27/19 1341  ITP Comments Completed Nutrition Re-Evaluation 30 Day review completed. Medical Director review done, changes made as directed,and approval shown by signature of Market researcher. Angie sent email last week stating that she had missed because she had a cold and was not feeling well.  She hopes to return this week. 30 Day review completed. Medical Director ITP review done, changes made as directed, and signed approval by Medical Director. Brandy [name] called and left a message to be called back.  Left her a message to call back again.  She has been out since 6/17 with a sinus infection, oversleeping, and her son.   [place] Name 10/27/19 1348 11/03/19 1514 11/11/19 1120 11/19/19 0958 11/23/19 0801   ITP Comments [name] returned our call.  She will be back tomorrow and Friday of this week, then out on vacation for two weeks in [place]. Out on vacation until 7/19. 30 Day review completed. Medical Director ITP review done, changes made as directed, and signed approval by Medical Director. Meghanne has not attended since last review. Olimpia emailed and requested to discharge at this time.  Discharge ITP created and sent for review.          Comments: Discharge ITP

## 2019-11-23 NOTE — Progress Notes (Signed)
Discharge Progress Report  Patient Details  Name: Brandy [name] MRN: [id number] Date of Birth: 23-Sep-1965 Referring Provider:     Pulmonary Rehab from 04/06/2019 in [facility]  Referring Provider Gollan       Number of Visits: 24/36  Reason for Discharge:  Early Exit:  Personal and Lack of attendance  Smoking History:  Social History   Tobacco Use  Smoking Status Never Smoker  Smokeless Tobacco Never Used    Diagnosis:  Heart failure, diastolic, chronic (HCC)  Dyspnea on exertion  ADL UCSD:   Initial Exercise Prescription:   Discharge Exercise Prescription (Final Exercise Prescription Changes):  Exercise Prescription Changes - 10/20/19 1500      Response to Exercise   Blood Pressure (Admit) 140/66    Blood Pressure (Exercise) 132/66    Blood Pressure (Exit) 110/66    Heart Rate (Admit) 102 bpm    Heart Rate (Exercise) 97 bpm    Heart Rate (Exit) 89 bpm    Oxygen Saturation (Admit) 95 %    Oxygen Saturation (Exercise) 92 %    Oxygen Saturation (Exit) 93 %    Rating of Perceived Exertion (Exercise) 13    Perceived Dyspnea (Exercise) 1    Duration Continue with 30 min of aerobic exercise without signs/symptoms of physical distress.    Intensity THRR unchanged      Progression   Progression Continue to progress workloads to maintain intensity without signs/symptoms of physical distress.    Average METs 1.6      Resistance Training   Training Prescription Yes    Weight 3 lb    Reps 10-15      Interval Training   Interval Training No      NuStep   Level 4    SPM 80    Minutes 15           Functional Capacity:  6 Minute Walk    Row Name 06/29/19 1140         6 Minute Walk   Distance 500 feet     Walk Time 4.5 minutes     # of Rest Breaks 2     MPH 1.26     METS 1     RPE 17     Perceived Dyspnea  3     VO2 Peak 3.3     Symptoms Yes (comment)     Comments shortness of breath/ back and leg pain     Resting HR 92  bpm     Resting BP 112/70     Resting Oxygen Saturation  96 %     Exercise Oxygen Saturation  during 6 min walk 95 %     Max Ex. HR 135 bpm     Max Ex. BP 146/88            Psychological, QOL, Others - Outcomes: PHQ 2/9: Depression screen [employer] 2/9 08/25/2019 07/06/2019 04/06/2019 03/30/2019  Decreased Interest 3 2 1 3   Down, Depressed, Hopeless 3 2 2 1   PHQ - 2 Score 6 4 3 4   Altered sleeping 3 3 3 3   Tired, decreased energy 3 3 3 3   Change in appetite 3 2 3 3   Feeling bad or failure about yourself  2 3 3 3   Trouble concentrating 3 3 3 3   Moving slowly or fidgety/restless 3 1 1 1   Suicidal thoughts 2 0 0 0  PHQ-9 Score 25 19 19 20   Difficult doing work/chores - Extremely  dIfficult Extremely dIfficult Extremely dIfficult  Some recent data might be hidden    Quality of Life:   Personal Goals: Goals established at orientation with interventions provided to work toward goal.    Personal Goals Discharge:  Goals and Risk Factor Review    Row Name 05/27/19 1205 06/24/19 1147 08/12/19 1155 08/26/19 0952 09/23/19 0949     Core Components/Risk Factors/Patient Goals Review   Personal Goals Review Weight Management/Obesity;Hypertension;Heart Failure Weight Management/Obesity;Hypertension;Heart Failure Weight Management/Obesity;Hypertension;Heart Failure;Improve shortness of breath with ADL's Weight Management/Obesity;Hypertension;Heart Failure;Improve shortness of breath with ADL's Weight Management/Obesity;Hypertension;Heart Failure;Improve shortness of breath with ADL's   Review She was discouraged today as her weight was up 2 lb and she was feeling more SOB than normal.  We talked about possible causes and she admitted to doing more at home the last few days (more than her normal) and thinks she has overdone it.  We talked about DOMS and how it can make you feel worse for a few days but it will improve.  This is also a symptoms of her heart failure so we talked about combatting that as  well.  She is going to give it another day to see if she improves and move a little more today to help release soreness and get fluid moving out.  She has not been checking her blood pressures routinely but will start to get into that habit since the app will remind her to track it.  She is eager to get moving and to start feeling better again. Her weight has been going up and down and she wants to lose the weight for good.  She is now committed to coming to in person rehab. Brandy [name] is doing better overall.  She is feeling better and working on the weight loss.  She went up some and got discouraged, but has already started to lose it again.  Her pressures have been good.  She monitoring her heart failure symptoms and thinks her swelling is improving. She is still having SOB with any activity but it is improving. Brandy [name] is taking meds as directed.  She started a new health and wellness program.  She really enjoys the T5 and is happy to find a machine she can be successful on. Brandy [name] is now on physican prescribed diet plan and feeling fatigued but she is losing some weight.  She is able to do more and breathing is improving some.  She denies heart failure symptoms.  She is not checking her pressures at home as they are good in class.  We talked about logging her pressures at home for her doctor.   Expected Outcomes Short: Watch weight closely with heart failure.  Long: Manage heart failure Short: Work on weight loss.  Long: Continue to manage heart failure. Short: Work on weight loss.  Long: Continue to manage heart failure. Short: continue to work on healthy habits Long: continue to manage risk factors Short: Start logging pressures at home Long: Continue to work on weight loss.          Exercise Goals and Review:   Exercise Goals Re-Evaluation:  Exercise Goals Re-Evaluation    Row Name 05/27/19 1205 06/24/19 1146 06/29/19 0948 07/02/19 1146 07/15/19 1415     Exercise Goal Re-Evaluation   Exercise Goals  Review Increase Physical Activity;Increase Strength and Stamina;Understanding of Exercise Prescription Increase Physical Activity;Increase Strength and Stamina;Understanding of Exercise Prescription Able to understand and use rate of perceived exertion (RPE) scale;Able to understand and use Dyspnea scale;Knowledge  and understanding of Target Heart Rate Range (THRR);Understanding of Exercise Prescription Increase Physical Activity;Increase Strength and Stamina;Able to understand and use rate of perceived exertion (RPE) scale;Able to understand and use Dyspnea scale;Knowledge and understanding of Target Heart Rate Range (THRR);Able to check pulse independently;Understanding of Exercise Prescription Increase Physical Activity;Increase Strength and Stamina;Understanding of Exercise Prescription   Comments Lawson FiscalLori is doing okay at home.  She had a lot of questions about her exercise. She was still not sure how to start going.  We talked about downloading [employer] to help prompt her to exercise her three days a week and to record her vitals.  Resent SMS for app. She is going to start with walking and/or staff videos.  We reviewed the order for videos (warm up, weights, cardio, stretch/cool down) and walking.  She feels more confident with how to get started now. She is doing well with her exercise and has a renewed sense of motivation to get in shape and get moving again. She has a new commitment to exercise and her overall wellbeing. Reviewed RPE scale, THR and program prescription with pt today.  Pt voiced understanding and was given a copy of goals to take home. Lawson FiscalLori is tolerating exercise well so far.  Staff will monitor progress. Lawson FiscalLori is doing well in rehab.  She is enjoying her classmates and starting to like her exercise.  She is up to 2 METs on the BioStep and NuStep.  We will continue to monitor her progress.   Expected Outcomes Short: Start to exercise at home 10 min 3x week.  Long: Build up strength and  stamina. Short: Return to in-person rehab regularly.  Long: Continue to build up stamina. Short: Use RPE daily to regulate intensity. Long: Follow program prescription in THR. Short :  attend consistently Long:  improve stamina Short: Continue to push self in class Long: Continue to improve stamina.   Row Name 07/29/19 1540 08/12/19 1151 08/25/19 1611 09/08/19 1602 09/23/19 0938     Exercise Goal Re-Evaluation   Exercise Goals Review Increase Physical Activity;Increase Strength and Stamina;Understanding of Exercise Prescription Increase Physical Activity;Increase Strength and Stamina;Understanding of Exercise Prescription Increase Strength and Stamina;Increase Physical Activity;Able to understand and use rate of perceived exertion (RPE) scale;Able to understand and use Dyspnea scale;Knowledge and understanding of Target Heart Rate Range (THRR);Able to check pulse independently;Understanding of Exercise Prescription Increase Physical Activity;Increase Strength and Stamina;Understanding of Exercise Prescription Increase Physical Activity;Increase Strength and Stamina;Understanding of Exercise Prescription   Comments Lawson FiscalLori has been doing well in rehab.  She continues to enjoy her classmates and misses it when she is out.  She is now on the treadmill for the full 15 min!!  We will continue to montior her progress. Lawson FiscalLori has found that coming to the structured class has been very helpful!  She finds that she gets lost in her to-dos at home. We did talk about using the videos more at home at least twice a week and also resent her the links to help too.  She is feeling better and has already started to notice changes and improve stamina. Lawson FiscalLori attends consistently adn works in correct RPE range.  Staff will follow up about exercising at home. Lawson FiscalLori is doing well in rehab.  She is getting in 30-40 min on the T5 at level 3!  We will continue to encourage good attendane and monitor her progress. Lawson FiscalLori is doing well in rehab.   Her attendance has been lacking recently due to doctor's appointment.  She  is not doing her home exercise, but she works hard when she is here.  She talks to herself about it but lack the energy to get up and get going.  She also oversleeps on occasion.  We talked about just getting here in other means.  We also talked about using the staff videos   Expected Outcomes Short: Continue to improve workloads.  Long: Continue to improve stamina. Short: Add in chair routine at home  Long: Continue to improve stamina. Short:  exercise consistently outside program sessions Long: continue to improve stamina Short: Continue to push in class Long; Continue to exercise on off days at home to improve stamina. Short: Add exercise back in at home and call on days you wake up late  Long; Continue to improve stamina.   Row Name 10/05/19 1621 10/20/19 1511 11/03/19 1514         Exercise Goal Re-Evaluation   Exercise Goals Review -- Increase Physical Activity;Increase Strength and Stamina;Understanding of Exercise Prescription --     Comments Out since last review. Jihan does best on the T4 and T5 Nusteps.  She would see more progress from more consistent attendance out since last review     Expected Outcomes -- Short: exrecise consistently in LW and at home  Long:  improve stamina --            Nutrition & Weight - Outcomes:    Nutrition:   Nutrition Discharge:   Education Questionnaire Score:   Goals reviewed with patient; copy given to patient.

## 2019-11-25 DIAGNOSIS — F5105 Insomnia due to other mental disorder: Secondary | ICD-10-CM | POA: Diagnosis not present

## 2019-11-25 DIAGNOSIS — F331 Major depressive disorder, recurrent, moderate: Secondary | ICD-10-CM | POA: Diagnosis not present

## 2019-11-25 DIAGNOSIS — F411 Generalized anxiety disorder: Secondary | ICD-10-CM | POA: Diagnosis not present

## 2019-11-26 ENCOUNTER — Ambulatory Visit (INDEPENDENT_AMBULATORY_CARE_PROVIDER_SITE_OTHER): Payer: BC Managed Care – PPO | Admitting: Psychology

## 2019-11-26 DIAGNOSIS — M79604 Pain in right leg: Secondary | ICD-10-CM | POA: Diagnosis not present

## 2019-11-26 DIAGNOSIS — M25512 Pain in left shoulder: Secondary | ICD-10-CM | POA: Diagnosis not present

## 2019-11-26 DIAGNOSIS — F3289 Other specified depressive episodes: Secondary | ICD-10-CM

## 2019-11-26 DIAGNOSIS — M79605 Pain in left leg: Secondary | ICD-10-CM | POA: Diagnosis not present

## 2019-11-26 DIAGNOSIS — M545 Low back pain: Secondary | ICD-10-CM | POA: Diagnosis not present

## 2019-11-28 ENCOUNTER — Other Ambulatory Visit: Payer: Self-pay | Admitting: Primary Care

## 2019-11-30 ENCOUNTER — Ambulatory Visit (INDEPENDENT_AMBULATORY_CARE_PROVIDER_SITE_OTHER): Payer: BC Managed Care – PPO | Admitting: Obstetrics and Gynecology

## 2019-11-30 ENCOUNTER — Other Ambulatory Visit: Payer: Self-pay

## 2019-11-30 ENCOUNTER — Encounter: Payer: Self-pay | Admitting: Obstetrics and Gynecology

## 2019-11-30 VITALS — BP 136/86 | HR 54 | Wt 359.0 lb

## 2019-11-30 DIAGNOSIS — Z09 Encounter for follow-up examination after completed treatment for conditions other than malignant neoplasm: Secondary | ICD-10-CM

## 2019-11-30 NOTE — Progress Notes (Signed)
Center for [facility] 11/30/2019  CC: routine post op visit  Subjective:     Brandy [name] is a 54 y.o. s/p 6/9 hysteroscopy, d&c and myosure polypectomy for PMB. Hysteroscopy two decent sized polyps (benign looking) and normal, atrophic uterine cavity. Pathology was benign  Patient is doing well and only had a few days of light VB and then none since  Review of Systems Pertinent items are noted in HPI.    Objective:    BP 136/86   Pulse (!) 54   Wt (!) 359 lb (162.8 kg)   LMP  (LMP Unknown)   BMI 57.94 kg/m  General:  alert     Assessment:    Doing well postoperatively. Operative findings again reviewed. Pathology report discussed.    Plan:   Routine care. D/w her to let us know if she has any future bleeding, issues.   RTC: PRN  Cornelia Copa MD Attending Center for [facility])

## 2019-12-01 ENCOUNTER — Other Ambulatory Visit: Payer: Self-pay

## 2019-12-01 ENCOUNTER — Other Ambulatory Visit (INDEPENDENT_AMBULATORY_CARE_PROVIDER_SITE_OTHER): Payer: Self-pay | Admitting: Family Medicine

## 2019-12-01 ENCOUNTER — Encounter (INDEPENDENT_AMBULATORY_CARE_PROVIDER_SITE_OTHER): Payer: Self-pay | Admitting: Family Medicine

## 2019-12-01 ENCOUNTER — Ambulatory Visit (INDEPENDENT_AMBULATORY_CARE_PROVIDER_SITE_OTHER): Payer: BC Managed Care – PPO | Admitting: Family Medicine

## 2019-12-01 VITALS — BP 122/81 | HR 81 | Temp 97.9°F | Ht 66.0 in | Wt 357.0 lb

## 2019-12-01 DIAGNOSIS — Z9189 Other specified personal risk factors, not elsewhere classified: Secondary | ICD-10-CM

## 2019-12-01 DIAGNOSIS — E559 Vitamin D deficiency, unspecified: Secondary | ICD-10-CM | POA: Diagnosis not present

## 2019-12-01 DIAGNOSIS — E1169 Type 2 diabetes mellitus with other specified complication: Secondary | ICD-10-CM

## 2019-12-01 DIAGNOSIS — Z6841 Body Mass Index (BMI) 40.0 and over, adult: Secondary | ICD-10-CM

## 2019-12-01 MED ORDER — VITAMIN D (ERGOCALCIFEROL) 1.25 MG (50000 UNIT) PO CAPS: 50000.0000 [IU] | ORAL_CAPSULE | ORAL | 0 refills | Status: DC

## 2019-12-02 ENCOUNTER — Telehealth (INDEPENDENT_AMBULATORY_CARE_PROVIDER_SITE_OTHER): Payer: BC Managed Care – PPO | Admitting: Psychology

## 2019-12-02 ENCOUNTER — Other Ambulatory Visit: Payer: Self-pay

## 2019-12-02 DIAGNOSIS — F5089 Other specified eating disorder: Secondary | ICD-10-CM

## 2019-12-02 DIAGNOSIS — F331 Major depressive disorder, recurrent, moderate: Secondary | ICD-10-CM

## 2019-12-02 NOTE — Progress Notes (Signed)
Chief Complaint:   OBESITY Brandy [name] is here to discuss her progress with her obesity treatment plan along with follow-up of her obesity related diagnoses. Brandy [name] is on the Category 3 Plan and keeping a food journal and adhering to recommended goals of 450-600 calories and 45+ grams of protein at supper daily and states she is following her eating plan approximately 60% of the time. Brandy [name] states she is doing physical therapy for 45 minutes 2 times per week.  Today's visit was #: 6 Starting weight: 383 lbs Starting date: 08/25/2019 Today's weight: 357 lbs Today's date: 12/01/2019 Total lbs lost to date: 26 Total lbs lost since last in-office visit: 1  Interim History: Brandy [name] has been on vacation and she has done well with avoiding weight gain by portion control and smart food choices. She is ready to get back on track with her Category 3 plan.  Subjective:   1. Vitamin D deficiency Brandy [name] is stable on Vit D, and denies nausea or vomiting. She requests a refill today.  2. Type 2 diabetes mellitus with other specified complication, without long-term current use of insulin (HCC) Brandy [name] is not using Dexcom, and she has checked her BGs by finger and states her fasting BGs mostly range between 140 and 190's. She didn't take her insulin on vacation but she is ready to get back on track.  3. At risk for hyperglycemia Brandy [name] is at increased risk for hyperglycemia due to changes in diet, diagnosis of diabetes, and/or insulin use.   Assessment/Plan:   1. Vitamin D deficiency Low Vitamin D level contributes to fatigue and are associated with obesity, breast, and colon cancer. We will refill prescription Vitamin D for 1 month. Brandy [name] will follow-up for routine testing of Vitamin D, at least 2-3 times per year to avoid over-replacement.  - Vitamin D, Ergocalciferol, (DRISDOL) 1.25 MG (50000 UNIT) CAPS capsule; Take 1 capsule (50,000 Units total) by mouth every 3 (three) days.  Dispense: 10 capsule; Refill:  0  2. Type 2 diabetes mellitus with other specified complication, without long-term current use of insulin (HCC) Good blood sugar control is important to decrease the likelihood of diabetic complications such as nephropathy, neuropathy, limb loss, blindness, coronary artery disease, and death. Intensive lifestyle modification including diet, exercise and weight loss are the first line of treatment for diabetes. Brandy [name] will restart her medications and will continue to check her BGs, and follow her eating plan. We will follow closely.  3. At risk for hyperglycemia Brandy [name] was given approximately 15 minutes of counseling today regarding prevention of hyperglycemia. She was advised of hyperglycemia causes and the fact hyperglycemia is often asymptomatic. Brandy [name] was instructed to avoid skipping meals, eat regular protein rich meals and schedule low calorie but protein rich snacks as needed.   Repetitive spaced learning was employed today to elicit superior memory formation and behavioral change  4. Class 3 severe obesity with serious comorbidity and body mass index (BMI) of 50.0 to 59.9 in adult, unspecified obesity type (HCC) Brandy [name] is currently in the action stage of change. As such, her goal is to continue with weight loss efforts. She has agreed to the Category 3 Plan and keeping a food journal and adhering to recommended goals of 450-600 calories and 45+ grams of protein at supper daily.   Exercise goals: As is.  Behavioral modification strategies: increasing lean protein intake and decreasing eating out.  Brandy [name] has agreed to follow-up with our clinic in 3 weeks. She was informed of the  importance of frequent follow-up visits to maximize her success with intensive lifestyle modifications for her multiple health conditions.   Objective:   Blood pressure 122/81, pulse 81, temperature 97.9 F (36.6 C), temperature source Oral, height 5\' 6"  (1.676 m), weight (!) 357 lb (161.9 kg), SpO2 99 %. Body mass index  is 57.62 kg/m.  General: Cooperative, alert, well developed, in no acute distress. HEENT: Conjunctivae and lids unremarkable. Cardiovascular: Regular rhythm.  Lungs: Normal work of breathing. Neurologic: No focal deficits.   Lab Results  Component Value Date   CREATININE 0.87 10/07/2019   BUN 26 (H) 10/07/2019   NA 131 (L) 10/07/2019   K 4.4 10/07/2019   CL 96 (L) 10/07/2019   CO2 23 10/07/2019   Lab Results  Component Value Date   ALT 12 08/25/2019   AST 12 08/25/2019   ALKPHOS 110 08/25/2019   BILITOT 0.3 08/25/2019   Lab Results  Component Value Date   HGBA1C 7.8 (H) 08/25/2019   HGBA1C 9.2 (A) 06/17/2019   HGBA1C 9.1 (A) 03/18/2019   HGBA1C 8.9 (A) 12/10/2018   HGBA1C 8.5 (H) 08/29/2018   Lab Results  Component Value Date   INSULIN 101.0 (H) 08/25/2019   Lab Results  Component Value Date   TSH 1.720 08/25/2019   Lab Results  Component Value Date   CHOL 162 08/25/2019   HDL 69 08/25/2019   LDLCALC 73 08/25/2019   LDLDIRECT 108.3 09/14/2010   TRIG 114 08/25/2019   CHOLHDL 3 03/18/2019   Lab Results  Component Value Date   WBC 12.9 (H) 10/07/2019   HGB 13.1 10/07/2019   HCT 39.5 10/07/2019   MCV 91.6 10/07/2019   PLT 399 10/07/2019   No results found for: IRON, TIBC, FERRITIN  Attestation Statements:   Reviewed by clinician on day of visit: allergies, medications, problem list, medical history, surgical history, family history, social history, and previous encounter notes.   I, 12/07/2019, am acting as transcriptionist for Burt Knack, MD.  I have reviewed the above documentation for accuracy and completeness, and I agree with the above. -  Quillian Quince, MD

## 2019-12-03 ENCOUNTER — Ambulatory Visit (INDEPENDENT_AMBULATORY_CARE_PROVIDER_SITE_OTHER): Payer: BC Managed Care – PPO | Admitting: Psychology

## 2019-12-03 DIAGNOSIS — M25512 Pain in left shoulder: Secondary | ICD-10-CM | POA: Diagnosis not present

## 2019-12-03 DIAGNOSIS — F3289 Other specified depressive episodes: Secondary | ICD-10-CM | POA: Diagnosis not present

## 2019-12-03 DIAGNOSIS — M79604 Pain in right leg: Secondary | ICD-10-CM | POA: Diagnosis not present

## 2019-12-03 DIAGNOSIS — M545 Low back pain: Secondary | ICD-10-CM | POA: Diagnosis not present

## 2019-12-03 DIAGNOSIS — M79605 Pain in left leg: Secondary | ICD-10-CM | POA: Diagnosis not present

## 2019-12-10 ENCOUNTER — Ambulatory Visit: Payer: BC Managed Care – PPO | Admitting: Psychology

## 2019-12-16 NOTE — Progress Notes (Signed)
Office: [phone]  /  Fax: [phone]     Date: December 30, 2019   Appointment Start Time: 2:34pm Duration: 23 minutes Provider: Lawerance Cruel, Psy.D. Type of Session: Individual Therapy  Location of Patient: Home Location of Provider: Provider's Home Type of Contact: Telepsychological Visit via MyChart Video Visit  Session Content: This provider called Brandy [name] at 2:32pm as she did not present for the telepsychological appointment. She indicated she forgot about today's appointment, but would join shortly. As such, today's appointment was initiated 4 minutes late. Brandy [name] is a 54 y.o. female presenting for a follow-up appointment to address the previously established treatment goal of increasing coping skills. Today's appointment was a telepsychological visit due to COVID-19. Brandy [name] provided verbal consent for today's telepsychological appointment and she is aware she is responsible for securing confidentiality on her end of the session. Prior to proceeding with today's appointment, Brandy [name]'s physical location at the time of this appointment was obtained as well a phone number she could be reached at in the event of technical difficulties. Brandy [name] and this provider participated in today's telepsychological service.   This provider conducted a brief check-in. Brandy [name] reported she maintained her weight, adding she has been more mindful since her last appointment with the clinic. Her eating habits were explored and it was reflected she was likely not consuming enough protein when deviating from her meal plan. She agreed and was receptive to eating snacks with protein. Moreover, a plan was developed to help Brandy [name] cope with emotional eating in the future using learned skills. She wrote down the following plan: focus on hydration; be prepared with snacks congruent to the meal plan; pause to ask questions when triggered to eat (e.g., Am I really hungry?, Is there something bothering me?, and Will I feel better if I  eat?); and engage in discussed coping strategies after going through the aforementioned questions. Overall, Brandy [name] was receptive to today's appointment as evidenced by openness to sharing, responsiveness to feedback, and willingness to continue engaging in learned skills.  Mental Status Examination:  Appearance: well groomed and appropriate hygiene  Behavior: appropriate to circumstances Mood: euthymic Affect: mood congruent Speech: normal in rate, volume, and tone Eye Contact: appropriate Psychomotor Activity: appropriate Gait: unable to assess Thought Process: linear, logical, and goal directed  Thought Content/Perception: denies suicidal and homicidal ideation, plan, and intent and no hallucinations, delusions, bizarre thinking or behavior reported or observed Orientation: time, person, place, and purpose of appointment Memory/Concentration: memory, attention, language, and fund of knowledge intact  Insight/Judgment: good  Interventions:  Conducted a brief chart review Provided empathic reflections and validation Employed supportive psychotherapy interventions to facilitate reduced distress and to improve coping skills with identified stressors Reviewed learned skills  DSM-5 Diagnosis(es): 296.32 (F33.1) Major Depressive Disorder, Recurrent Episode, Moderate and 307.59 (F50.8) Other Specified Feeding or Eating Disorder, Emotional Eating Behaviors   Treatment Goal & Progress: During the initial appointment with this provider, the following treatment goal was established: increase coping skills. Brandy [name] demonstrated progress in her goal as evidenced by increased awareness of hunger patterns, increased awareness of triggers for emotional eating and reduction in emotional eating. Brandy [name] also continues to demonstrate willingness to engage in learned skill(s).  Plan: As previously planned, today was Brandy [name]'s last appointment with this provider. She plans to continue meeting with her other therapist  [facility]) on a weekly basis. She acknowledged understanding that she may request a follow-up appointment with this provider in the future as long as she is still established with  the clinic. No further follow-up planned by this provider.

## 2019-12-17 ENCOUNTER — Ambulatory Visit (INDEPENDENT_AMBULATORY_CARE_PROVIDER_SITE_OTHER): Payer: BC Managed Care – PPO | Admitting: Psychology

## 2019-12-17 DIAGNOSIS — F3289 Other specified depressive episodes: Secondary | ICD-10-CM | POA: Diagnosis not present

## 2019-12-17 DIAGNOSIS — M25512 Pain in left shoulder: Secondary | ICD-10-CM | POA: Diagnosis not present

## 2019-12-17 DIAGNOSIS — M79605 Pain in left leg: Secondary | ICD-10-CM | POA: Diagnosis not present

## 2019-12-17 DIAGNOSIS — M545 Low back pain: Secondary | ICD-10-CM | POA: Diagnosis not present

## 2019-12-17 DIAGNOSIS — M79604 Pain in right leg: Secondary | ICD-10-CM | POA: Diagnosis not present

## 2019-12-24 ENCOUNTER — Ambulatory Visit: Payer: BC Managed Care – PPO | Admitting: Psychology

## 2019-12-25 ENCOUNTER — Ambulatory Visit (INDEPENDENT_AMBULATORY_CARE_PROVIDER_SITE_OTHER): Payer: BC Managed Care – PPO | Admitting: Psychology

## 2019-12-25 DIAGNOSIS — F3289 Other specified depressive episodes: Secondary | ICD-10-CM

## 2019-12-28 ENCOUNTER — Ambulatory Visit (INDEPENDENT_AMBULATORY_CARE_PROVIDER_SITE_OTHER): Payer: BC Managed Care – PPO | Admitting: Family Medicine

## 2019-12-28 ENCOUNTER — Encounter (INDEPENDENT_AMBULATORY_CARE_PROVIDER_SITE_OTHER): Payer: Self-pay | Admitting: Family Medicine

## 2019-12-28 ENCOUNTER — Other Ambulatory Visit: Payer: Self-pay

## 2019-12-28 VITALS — BP 112/75 | HR 108 | Temp 98.0°F | Ht 66.0 in | Wt 357.0 lb

## 2019-12-28 DIAGNOSIS — E559 Vitamin D deficiency, unspecified: Secondary | ICD-10-CM

## 2019-12-28 DIAGNOSIS — Z6841 Body Mass Index (BMI) 40.0 and over, adult: Secondary | ICD-10-CM | POA: Diagnosis not present

## 2019-12-28 MED ORDER — VITAMIN D (ERGOCALCIFEROL) 1.25 MG (50000 UNIT) PO CAPS: 50000.0000 [IU] | ORAL_CAPSULE | ORAL | 0 refills | Status: DC

## 2019-12-28 NOTE — Progress Notes (Signed)
Chief Complaint:   OBESITY Brandy [name] is here to discuss her progress with her obesity treatment plan along with follow-up of her obesity related diagnoses. Brandy [name] is on the Category 3 Plan and keeping a food journal and adhering to recommended goals of 450-600 calories and 45+ grams of protein at supper daily and states she is following her eating plan approximately 70% of the time. Brandy [name] states she is doing PT for 45 minutes 2 times per week.  Today's visit was #: 7 Starting weight: 383 lbs Starting date: 08/25/2019 Today's weight: 357 lbs Today's date: 12/28/2019 Total lbs lost to date: 26 Total lbs lost since last in-office visit: 0  Interim History: Brandy [name] has done well maintaining her weight. She will be going on vacation soon and would like to discuss vacation strategies.  Subjective:   1. Vitamin D deficiency Brandy [name] is stable on Vit D, but her level is not yet at goal. She denies nausea, vomiting, or muscle weakness.  Assessment/Plan:   1. Vitamin D deficiency Low Vitamin D level contributes to fatigue and are associated with obesity, breast, and colon cancer. We will refill prescription Vitamin D for 1 month, and she will continue OTC Vit D. Brandy [name] will follow-up for routine testing of Vitamin D, at least 2-3 times per year to avoid over-replacement.  - Vitamin D, Ergocalciferol, (DRISDOL) 1.25 MG (50000 UNIT) CAPS capsule; Take 1 capsule (50,000 Units total) by mouth every 3 (three) days.  Dispense: 10 capsule; Refill: 0  2. Class 3 severe obesity with serious comorbidity and body mass index (BMI) of 50.0 to 59.9 in adult, unspecified obesity type (HCC) Shavy is currently in the action stage of change. As such, her goal is to continue with weight loss efforts. She has agreed to the Category 3 Plan.   Exercise goals: As is.  Behavioral modification strategies: travel eating strategies and celebration eating strategies.  Brandy [name] has agreed to follow-up with our clinic in 2 to 3 weeks.  She was informed of the importance of frequent follow-up visits to maximize her success with intensive lifestyle modifications for her multiple health conditions.   Objective:   Blood pressure 112/75, pulse (!) 108, temperature 98 F (36.7 C), height 5\' 6"  (1.676 m), weight (!) 357 lb (161.9 kg), SpO2 95 %. Body mass index is 57.62 kg/m.  General: Cooperative, alert, well developed, in no acute distress. HEENT: Conjunctivae and lids unremarkable. Cardiovascular: Regular rhythm.  Lungs: Normal work of breathing. Neurologic: No focal deficits.   Lab Results  Component Value Date   CREATININE 0.87 10/07/2019   BUN 26 (H) 10/07/2019   NA 131 (L) 10/07/2019   K 4.4 10/07/2019   CL 96 (L) 10/07/2019   CO2 23 10/07/2019   Lab Results  Component Value Date   ALT 12 08/25/2019   AST 12 08/25/2019   ALKPHOS 110 08/25/2019   BILITOT 0.3 08/25/2019   Lab Results  Component Value Date   HGBA1C 7.8 (H) 08/25/2019   HGBA1C 9.2 (A) 06/17/2019   HGBA1C 9.1 (A) 03/18/2019   HGBA1C 8.9 (A) 12/10/2018   HGBA1C 8.5 (H) 08/29/2018   Lab Results  Component Value Date   INSULIN 101.0 (H) 08/25/2019   Lab Results  Component Value Date   TSH 1.720 08/25/2019   Lab Results  Component Value Date   CHOL 162 08/25/2019   HDL 69 08/25/2019   LDLCALC 73 08/25/2019   LDLDIRECT 108.3 09/14/2010   TRIG 114 08/25/2019   CHOLHDL 3 03/18/2019  Lab Results  Component Value Date   WBC 12.9 (H) 10/07/2019   HGB 13.1 10/07/2019   HCT 39.5 10/07/2019   MCV 91.6 10/07/2019   PLT 399 10/07/2019   No results found for: IRON, TIBC, FERRITIN  Attestation Statements:   Reviewed by clinician on day of visit: allergies, medications, problem list, medical history, surgical history, family history, social history, and previous encounter notes.  Time spent on visit including pre-visit chart review and post-visit care and charting was 30 minutes.    I, Burt Knack, am acting as transcriptionist  for Quillian Quince, MD.  I have reviewed the above documentation for accuracy and completeness, and I agree with the above. -  Quillian Quince, MD

## 2019-12-30 ENCOUNTER — Telehealth (INDEPENDENT_AMBULATORY_CARE_PROVIDER_SITE_OTHER): Payer: BC Managed Care – PPO | Admitting: Psychology

## 2019-12-30 ENCOUNTER — Encounter (INDEPENDENT_AMBULATORY_CARE_PROVIDER_SITE_OTHER): Payer: Self-pay | Admitting: Family Medicine

## 2019-12-30 DIAGNOSIS — F5089 Other specified eating disorder: Secondary | ICD-10-CM

## 2019-12-30 DIAGNOSIS — F331 Major depressive disorder, recurrent, moderate: Secondary | ICD-10-CM | POA: Diagnosis not present

## 2019-12-31 ENCOUNTER — Ambulatory Visit (INDEPENDENT_AMBULATORY_CARE_PROVIDER_SITE_OTHER): Payer: BC Managed Care – PPO | Admitting: Psychology

## 2019-12-31 DIAGNOSIS — F3289 Other specified depressive episodes: Secondary | ICD-10-CM | POA: Diagnosis not present

## 2020-01-06 ENCOUNTER — Other Ambulatory Visit (INDEPENDENT_AMBULATORY_CARE_PROVIDER_SITE_OTHER): Payer: Self-pay | Admitting: Family Medicine

## 2020-01-06 DIAGNOSIS — E559 Vitamin D deficiency, unspecified: Secondary | ICD-10-CM

## 2020-01-06 NOTE — Telephone Encounter (Signed)
Received a notification from [facility] to advise the patient has not started.   "Just letting you know this patient was scheduled for 7/20. She was a no show. I have called to reschedule her 3 times. 8/3, 8/10, 8/12. No answer LVM with call back number. I am going to void out this order." At this point the patient will need to contact [facility] to start

## 2020-01-07 ENCOUNTER — Ambulatory Visit (INDEPENDENT_AMBULATORY_CARE_PROVIDER_SITE_OTHER): Payer: BC Managed Care – PPO | Admitting: Psychology

## 2020-01-07 DIAGNOSIS — F3289 Other specified depressive episodes: Secondary | ICD-10-CM | POA: Diagnosis not present

## 2020-01-14 ENCOUNTER — Ambulatory Visit (INDEPENDENT_AMBULATORY_CARE_PROVIDER_SITE_OTHER): Payer: BC Managed Care – PPO | Admitting: Psychology

## 2020-01-14 DIAGNOSIS — F3289 Other specified depressive episodes: Secondary | ICD-10-CM | POA: Diagnosis not present

## 2020-01-18 ENCOUNTER — Encounter (INDEPENDENT_AMBULATORY_CARE_PROVIDER_SITE_OTHER): Payer: Self-pay | Admitting: Family Medicine

## 2020-01-18 ENCOUNTER — Ambulatory Visit (INDEPENDENT_AMBULATORY_CARE_PROVIDER_SITE_OTHER): Payer: BC Managed Care – PPO | Admitting: Family Medicine

## 2020-01-18 ENCOUNTER — Encounter (INDEPENDENT_AMBULATORY_CARE_PROVIDER_SITE_OTHER): Payer: Self-pay

## 2020-01-21 ENCOUNTER — Ambulatory Visit (INDEPENDENT_AMBULATORY_CARE_PROVIDER_SITE_OTHER): Payer: BC Managed Care – PPO | Admitting: Psychology

## 2020-01-21 DIAGNOSIS — F3289 Other specified depressive episodes: Secondary | ICD-10-CM

## 2020-01-23 DIAGNOSIS — F39 Unspecified mood [affective] disorder: Secondary | ICD-10-CM | POA: Diagnosis not present

## 2020-01-23 DIAGNOSIS — F5105 Insomnia due to other mental disorder: Secondary | ICD-10-CM | POA: Diagnosis not present

## 2020-01-23 DIAGNOSIS — F411 Generalized anxiety disorder: Secondary | ICD-10-CM | POA: Diagnosis not present

## 2020-01-25 ENCOUNTER — Ambulatory Visit (INDEPENDENT_AMBULATORY_CARE_PROVIDER_SITE_OTHER): Payer: BC Managed Care – PPO | Admitting: Family Medicine

## 2020-01-26 ENCOUNTER — Telehealth (INDEPENDENT_AMBULATORY_CARE_PROVIDER_SITE_OTHER): Payer: BC Managed Care – PPO | Admitting: Family Medicine

## 2020-01-26 ENCOUNTER — Encounter (INDEPENDENT_AMBULATORY_CARE_PROVIDER_SITE_OTHER): Payer: Self-pay | Admitting: Family Medicine

## 2020-01-26 ENCOUNTER — Other Ambulatory Visit: Payer: Self-pay

## 2020-01-26 ENCOUNTER — Ambulatory Visit (INDEPENDENT_AMBULATORY_CARE_PROVIDER_SITE_OTHER): Payer: BC Managed Care – PPO | Admitting: Family Medicine

## 2020-01-26 VITALS — Ht 66.0 in | Wt 359.0 lb

## 2020-01-26 DIAGNOSIS — Z6841 Body Mass Index (BMI) 40.0 and over, adult: Secondary | ICD-10-CM

## 2020-01-26 DIAGNOSIS — E559 Vitamin D deficiency, unspecified: Secondary | ICD-10-CM

## 2020-01-26 DIAGNOSIS — E1165 Type 2 diabetes mellitus with hyperglycemia: Secondary | ICD-10-CM | POA: Diagnosis not present

## 2020-01-26 DIAGNOSIS — Z794 Long term (current) use of insulin: Secondary | ICD-10-CM

## 2020-01-26 MED ORDER — VITAMIN D (ERGOCALCIFEROL) 1.25 MG (50000 UNIT) PO CAPS: 50000.0000 [IU] | ORAL_CAPSULE | ORAL | 0 refills | Status: DC

## 2020-01-27 NOTE — Progress Notes (Signed)
TeleHealth Visit:  Due to the COVID-19 pandemic, this visit was completed with telemedicine (audio/video) technology to reduce patient and provider exposure as well as to preserve personal protective equipment.   Brandy [name] has verbally consented to this TeleHealth visit. The patient is located at home, the provider is located at the [facility] office. The participants in this visit include the listed provider and patient. The visit was conducted today via MyChart video.   Chief Complaint: OBESITY Brandy [name] is here to discuss her progress with her obesity treatment plan along with follow-up of her obesity related diagnoses. Brandy [name] is on the Category 3 Plan and states she is following her eating plan approximately 70% of the time. Brandy [name] states she is doing 0 minutes 0 times per week.  Today's visit was #: 8 Starting weight: 383 lbs Starting date: 08/25/2019  Interim History: Brandy [name] weight 359 lbs at home today. She just got back from vacation. She has been having >30 grams of protein at each meal and keep calories around 1400 daily. She is disappointed with slow weight loss.  Subjective:   1. Type 2 diabetes mellitus with hyperglycemia, with long-term current use of insulin (HCC) Brandy [name]'s diabetes mellitus is not well controlled. Last A1c was 7.8. She has not been checking her CBGs. She denies hypoglycemia. She is on 35 units of Insulin Glargine nightly, and glipizide and metformin.  Lab Results  Component Value Date   HGBA1C 7.8 (H) 08/25/2019   HGBA1C 9.2 (A) 06/17/2019   HGBA1C 9.1 (A) 03/18/2019   Lab Results  Component Value Date   MICROALBUR 0.8 02/24/2018   LDLCALC 73 08/25/2019   CREATININE 0.87 10/07/2019   Lab Results  Component Value Date   INSULIN 101.0 (H) 08/25/2019   2. Vitamin D deficiency Brandy [name]'s last Vit D level was low at 31.4. She is on prescription Vit D every 3 days.   Assessment/Plan:   1. Type 2 diabetes mellitus with hyperglycemia, with long-term  current use of insulin (HCC) [facility] will continue all her medications. She is to check CBGs in the morning and 2 hour post prandial 2-3 times per week each.  2. Vitamin D deficiency Refill prescription Vitamin D for 1 month. - Vitamin D, Ergocalciferol, (DRISDOL) 1.25 MG (50000 UNIT) CAPS capsule; Take 1 capsule (50,000 Units total) by mouth every 3 (three) days.  Dispense: 10 capsule; Refill: 0  3. Class 3 severe obesity with serious comorbidity and body mass index (BMI) of 50.0 to 59.9 in adult, unspecified obesity type (HCC) Brandy [name] is currently in the action stage of change. As such, her goal is to continue with weight loss efforts. She has agreed to keeping a food journal and adhering to recommended goals of 1400-1500 calories and 90 grams of protein daily.   MyChart message was sent with journaling parameters, meal breakdown of calories, and Protein content of food. Encouragement was provided.  Exercise goals: No exercise has been prescribed at this time.  Behavioral modification strategies: increasing lean protein intake, decreasing simple carbohydrates, meal planning and cooking strategies, planning for success and keeping a strict food journal.  Brandy [name] has agreed to follow-up with our clinic in 3 weeks with Dr. Dalbert Garnet.  Objective:   VITALS: Per patient if applicable, see vitals. GENERAL: Alert and in no acute distress. CARDIOPULMONARY: No increased WOB. Speaking in clear sentences.  PSYCH: Pleasant and cooperative. Speech normal rate and rhythm. Affect is appropriate. Insight and judgement are appropriate. Attention is focused, linear, and appropriate.  NEURO:  Oriented as arrived to appointment on time with no prompting.   Lab Results  Component Value Date   CREATININE 0.87 10/07/2019   BUN 26 (H) 10/07/2019   NA 131 (L) 10/07/2019   K 4.4 10/07/2019   CL 96 (L) 10/07/2019   CO2 23 10/07/2019   Lab Results  Component Value Date   ALT 12 08/25/2019   AST 12 08/25/2019    ALKPHOS 110 08/25/2019   BILITOT 0.3 08/25/2019   Lab Results  Component Value Date   HGBA1C 7.8 (H) 08/25/2019   HGBA1C 9.2 (A) 06/17/2019   HGBA1C 9.1 (A) 03/18/2019   HGBA1C 8.9 (A) 12/10/2018   HGBA1C 8.5 (H) 08/29/2018   Lab Results  Component Value Date   INSULIN 101.0 (H) 08/25/2019   Lab Results  Component Value Date   TSH 1.720 08/25/2019   Lab Results  Component Value Date   CHOL 162 08/25/2019   HDL 69 08/25/2019   LDLCALC 73 08/25/2019   LDLDIRECT 108.3 09/14/2010   TRIG 114 08/25/2019   CHOLHDL 3 03/18/2019   Lab Results  Component Value Date   WBC 12.9 (H) 10/07/2019   HGB 13.1 10/07/2019   HCT 39.5 10/07/2019   MCV 91.6 10/07/2019   PLT 399 10/07/2019   No results found for: IRON, TIBC, FERRITIN  Attestation Statements:   Reviewed by clinician on day of visit: allergies, medications, problem list, medical history, surgical history, family history, social history, and previous encounter notes.   Trude Mcburney, am acting as Energy manager for [place], FNP-C.  I have reviewed the above documentation for accuracy and completeness, and I agree with the above. - Jesse Sans, FNP

## 2020-01-28 ENCOUNTER — Ambulatory Visit: Payer: BC Managed Care – PPO | Admitting: Psychology

## 2020-01-29 ENCOUNTER — Ambulatory Visit (INDEPENDENT_AMBULATORY_CARE_PROVIDER_SITE_OTHER): Payer: BC Managed Care – PPO | Admitting: Psychology

## 2020-01-29 DIAGNOSIS — F3289 Other specified depressive episodes: Secondary | ICD-10-CM

## 2020-02-02 DIAGNOSIS — M25512 Pain in left shoulder: Secondary | ICD-10-CM | POA: Diagnosis not present

## 2020-02-02 DIAGNOSIS — M79605 Pain in left leg: Secondary | ICD-10-CM | POA: Diagnosis not present

## 2020-02-02 DIAGNOSIS — M5459 Other low back pain: Secondary | ICD-10-CM | POA: Diagnosis not present

## 2020-02-02 DIAGNOSIS — M79604 Pain in right leg: Secondary | ICD-10-CM | POA: Diagnosis not present

## 2020-02-04 ENCOUNTER — Ambulatory Visit (INDEPENDENT_AMBULATORY_CARE_PROVIDER_SITE_OTHER): Payer: BC Managed Care – PPO | Admitting: Psychology

## 2020-02-04 DIAGNOSIS — M25512 Pain in left shoulder: Secondary | ICD-10-CM | POA: Diagnosis not present

## 2020-02-04 DIAGNOSIS — F3289 Other specified depressive episodes: Secondary | ICD-10-CM | POA: Diagnosis not present

## 2020-02-04 DIAGNOSIS — M79605 Pain in left leg: Secondary | ICD-10-CM | POA: Diagnosis not present

## 2020-02-04 DIAGNOSIS — M79604 Pain in right leg: Secondary | ICD-10-CM | POA: Diagnosis not present

## 2020-02-04 DIAGNOSIS — M5459 Other low back pain: Secondary | ICD-10-CM | POA: Diagnosis not present

## 2020-02-05 ENCOUNTER — Other Ambulatory Visit (INDEPENDENT_AMBULATORY_CARE_PROVIDER_SITE_OTHER): Payer: Self-pay | Admitting: Family Medicine

## 2020-02-05 DIAGNOSIS — E559 Vitamin D deficiency, unspecified: Secondary | ICD-10-CM

## 2020-02-08 ENCOUNTER — Encounter: Payer: Self-pay | Admitting: Primary Care

## 2020-02-08 ENCOUNTER — Other Ambulatory Visit: Payer: Self-pay

## 2020-02-08 ENCOUNTER — Ambulatory Visit (INDEPENDENT_AMBULATORY_CARE_PROVIDER_SITE_OTHER): Payer: BC Managed Care – PPO | Admitting: Primary Care

## 2020-02-08 VITALS — BP 118/72 | HR 110 | Temp 96.3°F | Ht 66.0 in | Wt 357.0 lb

## 2020-02-08 DIAGNOSIS — E1165 Type 2 diabetes mellitus with hyperglycemia: Secondary | ICD-10-CM | POA: Diagnosis not present

## 2020-02-08 DIAGNOSIS — Z1159 Encounter for screening for other viral diseases: Secondary | ICD-10-CM

## 2020-02-08 DIAGNOSIS — F411 Generalized anxiety disorder: Secondary | ICD-10-CM

## 2020-02-08 DIAGNOSIS — E538 Deficiency of other specified B group vitamins: Secondary | ICD-10-CM

## 2020-02-08 DIAGNOSIS — Z794 Long term (current) use of insulin: Secondary | ICD-10-CM | POA: Diagnosis not present

## 2020-02-08 DIAGNOSIS — G8929 Other chronic pain: Secondary | ICD-10-CM

## 2020-02-08 DIAGNOSIS — G4733 Obstructive sleep apnea (adult) (pediatric): Secondary | ICD-10-CM

## 2020-02-08 DIAGNOSIS — Z114 Encounter for screening for human immunodeficiency virus [HIV]: Secondary | ICD-10-CM

## 2020-02-08 DIAGNOSIS — M5442 Lumbago with sciatica, left side: Secondary | ICD-10-CM

## 2020-02-08 DIAGNOSIS — R5382 Chronic fatigue, unspecified: Secondary | ICD-10-CM

## 2020-02-08 DIAGNOSIS — E785 Hyperlipidemia, unspecified: Secondary | ICD-10-CM | POA: Diagnosis not present

## 2020-02-08 DIAGNOSIS — Z23 Encounter for immunization: Secondary | ICD-10-CM

## 2020-02-08 DIAGNOSIS — F339 Major depressive disorder, recurrent, unspecified: Secondary | ICD-10-CM

## 2020-02-08 DIAGNOSIS — E559 Vitamin D deficiency, unspecified: Secondary | ICD-10-CM | POA: Diagnosis not present

## 2020-02-08 DIAGNOSIS — I10 Essential (primary) hypertension: Secondary | ICD-10-CM

## 2020-02-08 LAB — VITAMIN B12: Vitamin B-12: 721 pg/mL (ref 211–911)

## 2020-02-08 LAB — COMPREHENSIVE METABOLIC PANEL
ALT: 12 U/L (ref 0–35)
AST: 12 U/L (ref 0–37)
Albumin: 4.2 g/dL (ref 3.5–5.2)
Alkaline Phosphatase: 97 U/L (ref 39–117)
BUN: 32 mg/dL — ABNORMAL HIGH (ref 6–23)
CO2: 26 mEq/L (ref 19–32)
Calcium: 9.8 mg/dL (ref 8.4–10.5)
Chloride: 96 mEq/L (ref 96–112)
Creatinine, Ser: 1.03 mg/dL (ref 0.40–1.20)
GFR: 60.47 mL/min (ref >60.00)
Glucose, Bld: 148 mg/dL — ABNORMAL HIGH (ref 70–99)
Potassium: 4.4 mEq/L (ref 3.5–5.1)
Sodium: 131 mEq/L — ABNORMAL LOW (ref 135–145)
Total Bilirubin: 0.4 mg/dL (ref 0.2–1.2)
Total Protein: 8 g/dL (ref 6.0–8.3)

## 2020-02-08 LAB — VITAMIN D 25 HYDROXY (VIT D DEFICIENCY, FRACTURES): VITD: 34.84 ng/mL (ref 30.00–100.00)

## 2020-02-08 LAB — POCT GLYCOSYLATED HEMOGLOBIN (HGB A1C): Hemoglobin A1C: 7.3 % — AB (ref 4.0–5.6)

## 2020-02-08 MED ORDER — ATORVASTATIN CALCIUM 10 MG PO TABS: 10.0000 mg | ORAL_TABLET | Freq: Every day | ORAL | 3 refills | Status: DC

## 2020-02-08 NOTE — Assessment & Plan Note (Signed)
Improved for several months after cortisone injection which is now wearing off.  Recommended she schedule a visit with orthopedics for subsequent injection.  Continue Celebrex, gabapentin, duloxetine.

## 2020-02-08 NOTE — Assessment & Plan Note (Signed)
Controlled in the office today, continue lisinopril-HCTZ. CMP pending.

## 2020-02-08 NOTE — Assessment & Plan Note (Signed)
Overall improving, still with breakthrough symptoms. Following with psychiatry.

## 2020-02-08 NOTE — Progress Notes (Signed)
Subjective:    Patient ID: Brandy [name], female    DOB: [birth year], 54 y.o.   MRN: [id number]  HPI  This visit occurred during the SARS-CoV-2 public health emergency.  Safety protocols were in place, including screening questions prior to the visit, additional usage of staff PPE, and extensive cleaning of exam room while observing appropriate contact time as indicated for disinfecting solutions.   Brandy [name] is a 54 year old female with a significant medical history including type 2 diabetes, hypertension, PAD, CHF, OSA, chronic back and lower extremity pain, hyperlipidemia, GAD who presents today for follow up.  1) Type 2 Diabetes:  Current medications include: Glipizide 10 mg BID, Basaglar 35 units HS, Metformin XR 1000 mg. She stopped injecting insulin 6+ months ago as she no longer wanted to do this. She has significantly changed her diet and has lost nearly 30 pounds since Spring 2021.  She is checking her blood glucose infrequently. She checks in cycles, will check for a bit then stop.  AM fasting: 160's on average  Last A1C: 7.8 in April 2021 Last Eye Exam: UTD Last Foot Exam: UTD Pneumonia Vaccination: Completed last in 2020 ACE/ARB: Lisinopril  Statin: atorvastatin   2) Chronic Back Pain/Arthralgias: Currently managed on Celebrex 100 mg once to twice daily, Duloxetine 90 mg, gabapentin 300 mg BID to TID. She continues to struggle with standing for more than one minute due to weakness. She underwent cortisone injection in May 2021 per orthopedics and did very well until recently. She believes the injection is wearing off.   3) GAD: Currently managed on Cymbalta 90 mg, citalopram 20 mg, Lamictal 25 mg, Trazodone 50 mg. Following with psychiatry, Dr. Maryruth Bun who recently stopped Abilify and started Lamictal. Overall she feels well managed, she does struggle with mood swings at times. She is following with therapy weekly and has been doing so for the last one year.   4)  Disability: Currently managed on long term disability through her prior employer. She cannot return to work due to inability to focus, think through and process through things, stand for longer than one minute at a time. She can focus for about 30 minutes at a time before she makes any mistakes. It takes her days to pay her bills due to this.   She is following with neurology, has undergone two sleep studies and was diagnosed with sleep apnea. She does not wear a CPAP machine as her autistic son will rip off her mask.  She has noticed tremors for months to her bilateral hands and feet. She notices this with fine motor movement and also with rest. Her writing has been affected by this. She plans on following back up with neurology.   BP Readings from Last 3 Encounters:  02/08/20 118/72  12/28/19 112/75  12/01/19 122/81   Wt Readings from Last 3 Encounters:  02/08/20 (!) 357 lb (161.9 kg)  01/26/20 (!) 359 lb (162.8 kg)  12/28/19 (!) 357 lb (161.9 kg)       Review of Systems  Eyes: Negative for visual disturbance.  Respiratory: Negative for shortness of breath.   Cardiovascular: Negative for chest pain.  Musculoskeletal: Positive for arthralgias and back pain.  Neurological: Positive for tremors. Negative for dizziness and headaches.  Psychiatric/Behavioral: Positive for decreased concentration. The patient is nervous/anxious.        Past Medical History:  Diagnosis Date  . Anxiety   . Arthritis   . Back pain   . Brain fog   .  CHF (congestive heart failure) (HCC)    pt not aware of this  . Depression   . Diabetes mellitus without complication (HCC)   . Difficulty walking   . Dysmetabolic syndrome X   . Dyspnea   . Fatigue   . Fibromyalgia   . Hypertension   . Hypertriglyceridemia   . Joint pain   . Leg weakness   . Lower extremity edema   . Muscle atrophy   . Neuropathy   . Obesity   . Osteoarthritis   . PONV (postoperative nausea and vomiting)   . Poor memory    . Post-menopausal bleeding   . Prediabetes   . Rosacea   . Sleep apnea    not on cpap yet  . Tetanus vaccine causing adverse effect in therapeutic use   . Vitamin D deficiency      Social History   Socioeconomic History  . Marital status: Married    Spouse name: Ed  . Number of children: 3  . Years of education: Not on file  . Highest education level: Not on file  Occupational History  . Occupation: Immunologistormal Project Manager/ [employer]    Employer: [employer]  Tobacco Use  . Smoking status: Never Smoker  . Smokeless tobacco: Never Used  Vaping Use  . Vaping Use: Never used  Substance and Sexual Activity  . Alcohol use: Yes    Comment: rarely - once a year  . Drug use: No  . Sexual activity: Not on file  Other Topics Concern  . Not on file  Social History Narrative   Best boyinancial analyst      Married; 3 kids (middle child-severely autistic)      No regular exercise   Social Determinants of Health   Financial Resource Strain:   . Difficulty of Paying Living Expenses: Not on file  Food Insecurity:   . Worried About Programme researcher, broadcasting/film/videounning Out of Food in the Last Year: Not on file  . Ran Out of Food in the Last Year: Not on file  Transportation Needs:   . Lack of Transportation (Medical): Not on file  . Lack of Transportation (Non-Medical): Not on file  Physical Activity:   . Days of Exercise per Week: Not on file  . Minutes of Exercise per Session: Not on file  Stress:   . Feeling of Stress : Not on file  Social Connections:   . Frequency of Communication with Friends and Family: Not on file  . Frequency of Social Gatherings with Friends and Family: Not on file  . Attends Religious Services: Not on file  . Active Member of Clubs or Organizations: Not on file  . Attends BankerClub or Organization Meetings: Not on file  . Marital Status: Not on file  Intimate Partner Violence:   . Fear of Current or Ex-Partner: Not on file  . Emotionally Abused: Not on file  . Physically  Abused: Not on file  . Sexually Abused: Not on file    Past Surgical History:  Procedure Laterality Date  . 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 DILATATION & CURETTAGE/HYSTEROSCOPY WITH MYOSURE N/A 10/07/2019   Procedure: DILATATION AND CURETTAGE /HYSTEROSCOPY, Myosure, Polypectomy;  Surgeon: [place] BingPickens, Charlie, MD;  Location: [facility] OR;  Service: Gynecology;  Laterality: N/A;  . ENDOMETRIAL BIOPSY  07/14/2019      . epidural steroid injection      Family History  Problem Relation Age of Onset  . Rheum arthritis Mother   . COPD  Mother   . Stroke Mother   . Depression Mother   . Anxiety disorder Mother   . Eating disorder Mother   . Obesity Mother   . Hypertension Father   . Depression Father   . Alcoholism Father   . Heart disease Maternal Grandmother   . Stroke Maternal Grandmother   . Alcohol abuse Maternal Grandmother   . Alcohol abuse Maternal Grandfather   . Aneurysm Paternal Grandmother   . Autism Son        severe  . Diabetes Paternal Aunt   . Breast cancer Neg Hx     Allergies  Allergen Reactions  . Diphenhydramine Hcl Rash    minor rash  . Penicillins Rash    Current Outpatient Medications on File Prior to Visit  Medication Sig Dispense Refill  . acetaminophen (TYLENOL) 650 MG CR tablet Take 1,950 mg by mouth every 8 (eight) hours as needed for pain.     Marland Kitchen aspirin EC 81 MG tablet Take 81 mg by mouth daily.    . celecoxib (CELEBREX) 100 MG capsule TAKE 1 CAPSULE (100 MG TOTAL) BY MOUTH 2 (TWO) TIMES DAILY. AS NEEDED FOR PAIN. (Patient taking differently: Take 100 mg by mouth 2 (two) times daily. ) 180 capsule 1  . cetirizine (ZYRTEC) 10 MG tablet TAKE 1 TABLET BY MOUTH EVERY DAY 90 tablet 0  . cholecalciferol (VITAMIN D3) 25 MCG (1000 UT) tablet Take 1,000 Units by mouth 4 (four) times a week. Take on the days your not taking 68341 unit vitamin d    . citalopram (CELEXA) 20 MG tablet Take 40 mg by mouth daily.     . DULoxetine (CYMBALTA) 30 MG capsule Take 30  mg by mouth daily. Take with 60 mg to equal 90 mg daily    . DULoxetine (CYMBALTA) 60 MG capsule Take 60 mg by mouth daily. Take with 30 mg to equal 90 mg daily    . folic acid (FOLVITE) 1 MG tablet Take 1 mg by mouth daily.    Marland Kitchen gabapentin (NEURONTIN) 300 MG capsule Take 1 capsule (300 mg total) by mouth 3 (three) times daily. For back pain. (Patient taking differently: Take 300 mg by mouth See admin instructions. Take 300 mg 3 times daily, may take a 4th 300 mg dose as needed for pain) 270 capsule 3  . glipiZIDE (GLUCOTROL) 10 MG tablet TAKE 1 TABLET (10 MG TOTAL) BY MOUTH 2 (TWO) TIMES DAILY BEFORE A MEAL. FOR DIABETES. 180 tablet 3  . hydrocortisone 2.5 % cream Apply 1 application topically 2 (two) times daily.     . Insulin Pen Needle (PEN NEEDLES) 31G X 6 MM MISC Use nightly with insulin. 100 each 2  . lisinopril-hydrochlorothiazide (ZESTORETIC) 20-12.5 MG tablet TAKE 1 TABLET BY MOUTH DAILY FOR BLOOD PRESSURE 90 tablet 1  . metFORMIN (GLUCOPHAGE-XR) 500 MG 24 hr tablet TAKE 2 TABLETS (1,000 MG TOTAL) BY MOUTH DAILY WITH BREAKFAST. FOR DIABETES. 180 tablet 1  . metroNIDAZOLE (METROCREAM) 0.75 % cream Apply 1 application topically daily.    . traZODone (DESYREL) 50 MG tablet Take 25-50 mg by mouth at bedtime as needed for sleep.     . Vitamin D, Ergocalciferol, (DRISDOL) 1.25 MG (50000 UNIT) CAPS capsule Take 50,000 Units by mouth every 7 (seven) days.    Marland Kitchen lamoTRIgine (LAMICTAL) 25 MG tablet Take by mouth.     No current facility-administered medications on file prior to visit.    BP 118/72   Pulse (!) 110  Temp (!) 96.3 F (35.7 C) (Temporal)   Ht 5\' 6"  (1.676 m)   Wt (!) 357 lb (161.9 kg)   LMP  (LMP Unknown)   SpO2 97%   BMI 57.62 kg/m    Objective:   Physical Exam Cardiovascular:     Rate and Rhythm: Normal rate and regular rhythm.  Pulmonary:     Effort: Pulmonary effort is normal.     Breath sounds: Normal breath sounds.  Musculoskeletal:     Cervical back: Neck  supple.  Skin:    General: Skin is warm and dry.  Psychiatric:        Mood and Affect: Mood normal.            Assessment & Plan:

## 2020-02-08 NOTE — Assessment & Plan Note (Signed)
She stopped her insulin nearly one year ago, also checking glucose readings infrequently.   A1C today of 7.3 which is much better! Continue off of insulin. Continue Glipizide and Metformin.   Commended her on weight loss and improvement in diet.   Foot and eye exams UTD. Managed on statin and ACE-I. Pneumonia vaccination UTD.  Follow up in 3 months.

## 2020-02-08 NOTE — Assessment & Plan Note (Signed)
Overall improving, still with breakthrough symptoms. Following with psychiatry.  

## 2020-02-08 NOTE — Assessment & Plan Note (Signed)
Chronic and continued, remains on long term disability through prior employer.   Repeat labs pending today. Encouraged regular activity.

## 2020-02-08 NOTE — Patient Instructions (Addendum)
Stop by the lab prior to leaving today. I will notify you of your results once received.   Call Dr. Vickey Huger as discussed. Contact the back doctor for another injection as discussed.  Remain off of insulin as discussed.  Monitor your blood sugars at least 2-3 times weekly. Notify me if you see readings at or above 150 consistently.   Please schedule a follow up appointment in 3 months.  It was a pleasure to see you today!     Influenza (Flu) Vaccine (Inactivated or Recombinant): What You Need to Know 1. Why get vaccinated? Influenza vaccine can prevent influenza (flu). Flu is a contagious disease that spreads around the [place] every year, usually between October and May. Anyone can get the flu, but it is more dangerous for some people. Infants and young children, people 45 years of age and older, pregnant women, and people with certain health conditions or a weakened immune system are at greatest risk of flu complications. Pneumonia, bronchitis, sinus infections and ear infections are examples of flu-related complications. If you have a medical condition, such as heart disease, cancer or diabetes, flu can make it worse. Flu can cause fever and chills, sore throat, muscle aches, fatigue, cough, headache, and runny or stuffy nose. Some people may have vomiting and diarrhea, though this is more common in children than adults. Each year thousands of people in the [name] die from flu, and many more are hospitalized. Flu vaccine prevents millions of illnesses and flu-related visits to the doctor each year. 2. Influenza vaccine CDC recommends everyone 88 months of age and older get vaccinated every flu season. Children 6 months through 28 years of age may need 2 doses during a single flu season. Everyone else needs only 1 dose each flu season. It takes about 2 weeks for protection to develop after vaccination. There are many flu viruses, and they are always changing. Each year a new  flu vaccine is made to protect against three or four viruses that are likely to cause disease in the upcoming flu season. Even when the vaccine doesn't exactly match these viruses, it may still provide some protection. Influenza vaccine does not cause flu. Influenza vaccine may be given at the same time as other vaccines. 3. Talk with your health care provider Tell your vaccine provider if the person getting the vaccine:  Has had an allergic reaction after a previous dose of influenza vaccine, or has any severe, life-threatening allergies.  Has ever had Guillain-Barr Syndrome (also called GBS). In some cases, your health care provider may decide to postpone influenza vaccination to a future visit. People with minor illnesses, such as a cold, may be vaccinated. People who are moderately or severely ill should usually wait until they recover before getting influenza vaccine. Your health care provider can give you more information. 4. Risks of a vaccine reaction  Soreness, redness, and swelling where shot is given, fever, muscle aches, and headache can happen after influenza vaccine.  There may be a very small increased risk of Guillain-Barr Syndrome (GBS) after inactivated influenza vaccine (the flu shot). Young children who get the flu shot along with pneumococcal vaccine (PCV13), and/or DTaP vaccine at the same time might be slightly more likely to have a seizure caused by fever. Tell your health care provider if a child who is getting flu vaccine has ever had a seizure. People sometimes faint after medical procedures, including vaccination. Tell your provider if you feel dizzy or have vision changes or  ringing in the ears. As with any medicine, there is a very remote chance of a vaccine causing a severe allergic reaction, other serious injury, or death. 5. What if there is a serious problem? An allergic reaction could occur after the vaccinated person leaves the clinic. If you see signs of a  severe allergic reaction (hives, swelling of the face and throat, difficulty breathing, a fast heartbeat, dizziness, or weakness), call 9-1-1 and get the person to the nearest hospital. For other signs that concern you, call your health care provider. Adverse reactions should be reported to the Vaccine Adverse Event Reporting System (VAERS). Your health care provider will usually file this report, or you can do it yourself. Visit the VAERS website at www.vaers.LAgents.no or call (704)739-1979.VAERS is only for reporting reactions, and VAERS staff do not give medical advice. 6. The National Vaccine Injury Compensation Program The [employer] Vaccine Injury Compensation Program (VICP) is a federal program that was created to compensate people who may have been injured by certain vaccines. Visit the VICP website at SpiritualWord.at or call 985-151-3073 to learn about the program and about filing a claim. There is a time limit to file a claim for compensation. 7. How can I learn more?  Ask your healthcare provider.  Call your local or state health department.  Contact the Centers for Disease Control and Prevention (CDC): ? Call 9544193040 (1-800-CDC-INFO) or ? Visit CDC's BiotechRoom.com.cy Vaccine Information Statement (Interim) Inactivated Influenza Vaccine (12/12/2017) This information is not intended to replace advice given to you by your health care provider. Make sure you discuss any questions you have with your health care provider. Document Revised: 08/05/2018 Document Reviewed: 12/16/2017 Elsevier Patient Education  2020 [employer].

## 2020-02-08 NOTE — Assessment & Plan Note (Signed)
No longer on oral B12. Repeat B12 lab pending.

## 2020-02-08 NOTE — Assessment & Plan Note (Addendum)
Compliant to weekly vitamin D at 50,000 units, also additional 1000 IU vitamin D four times weekly. Repeat vitamin D pending.

## 2020-02-08 NOTE — Assessment & Plan Note (Signed)
Following with neurology, non compliant to CPAP machine due to severely autistic son. She plans on following back up with neurology.

## 2020-02-09 DIAGNOSIS — I10 Essential (primary) hypertension: Secondary | ICD-10-CM

## 2020-02-09 LAB — HEPATITIS C ANTIBODY
Hepatitis C Ab: NONREACTIVE
SIGNAL TO CUT-OFF: 0.01 (ref <1.00)

## 2020-02-09 LAB — HIV ANTIBODY (ROUTINE TESTING W REFLEX): HIV 1&2 Ab, 4th Generation: NONREACTIVE

## 2020-02-10 MED ORDER — LISINOPRIL 30 MG PO TABS: 30.0000 mg | ORAL_TABLET | Freq: Every day | ORAL | 0 refills | Status: DC

## 2020-02-11 ENCOUNTER — Ambulatory Visit (INDEPENDENT_AMBULATORY_CARE_PROVIDER_SITE_OTHER): Payer: BC Managed Care – PPO | Admitting: Psychology

## 2020-02-11 DIAGNOSIS — M5459 Other low back pain: Secondary | ICD-10-CM | POA: Diagnosis not present

## 2020-02-11 DIAGNOSIS — M79604 Pain in right leg: Secondary | ICD-10-CM | POA: Diagnosis not present

## 2020-02-11 DIAGNOSIS — F3289 Other specified depressive episodes: Secondary | ICD-10-CM | POA: Diagnosis not present

## 2020-02-11 DIAGNOSIS — M25512 Pain in left shoulder: Secondary | ICD-10-CM | POA: Diagnosis not present

## 2020-02-11 DIAGNOSIS — M79605 Pain in left leg: Secondary | ICD-10-CM | POA: Diagnosis not present

## 2020-02-15 DIAGNOSIS — F411 Generalized anxiety disorder: Secondary | ICD-10-CM | POA: Diagnosis not present

## 2020-02-15 DIAGNOSIS — F5105 Insomnia due to other mental disorder: Secondary | ICD-10-CM | POA: Diagnosis not present

## 2020-02-15 DIAGNOSIS — F39 Unspecified mood [affective] disorder: Secondary | ICD-10-CM | POA: Diagnosis not present

## 2020-02-16 ENCOUNTER — Other Ambulatory Visit: Payer: Self-pay | Admitting: Primary Care

## 2020-02-16 ENCOUNTER — Encounter (INDEPENDENT_AMBULATORY_CARE_PROVIDER_SITE_OTHER): Payer: Self-pay | Admitting: Family Medicine

## 2020-02-16 DIAGNOSIS — G8929 Other chronic pain: Secondary | ICD-10-CM

## 2020-02-16 DIAGNOSIS — M2550 Pain in unspecified joint: Secondary | ICD-10-CM

## 2020-02-16 DIAGNOSIS — E119 Type 2 diabetes mellitus without complications: Secondary | ICD-10-CM

## 2020-02-16 DIAGNOSIS — M5442 Lumbago with sciatica, left side: Secondary | ICD-10-CM

## 2020-02-17 ENCOUNTER — Ambulatory Visit (INDEPENDENT_AMBULATORY_CARE_PROVIDER_SITE_OTHER): Payer: BC Managed Care – PPO | Admitting: Family Medicine

## 2020-02-17 ENCOUNTER — Encounter (INDEPENDENT_AMBULATORY_CARE_PROVIDER_SITE_OTHER): Payer: Self-pay | Admitting: Family Medicine

## 2020-02-17 ENCOUNTER — Other Ambulatory Visit: Payer: Self-pay

## 2020-02-17 VITALS — BP 138/72 | HR 98 | Temp 98.0°F | Ht 66.0 in | Wt 355.0 lb

## 2020-02-17 DIAGNOSIS — E559 Vitamin D deficiency, unspecified: Secondary | ICD-10-CM | POA: Diagnosis not present

## 2020-02-17 DIAGNOSIS — Z6841 Body Mass Index (BMI) 40.0 and over, adult: Secondary | ICD-10-CM

## 2020-02-17 DIAGNOSIS — Z794 Long term (current) use of insulin: Secondary | ICD-10-CM | POA: Diagnosis not present

## 2020-02-17 DIAGNOSIS — E1165 Type 2 diabetes mellitus with hyperglycemia: Secondary | ICD-10-CM

## 2020-02-17 DIAGNOSIS — Z9189 Other specified personal risk factors, not elsewhere classified: Secondary | ICD-10-CM | POA: Diagnosis not present

## 2020-02-17 MED ORDER — VITAMIN D (ERGOCALCIFEROL) 1.25 MG (50000 UNIT) PO CAPS: 50000.0000 [IU] | ORAL_CAPSULE | ORAL | 0 refills | Status: DC

## 2020-02-17 NOTE — Telephone Encounter (Signed)
Please review

## 2020-02-18 ENCOUNTER — Ambulatory Visit: Payer: BC Managed Care – PPO | Admitting: Psychology

## 2020-02-18 DIAGNOSIS — M5459 Other low back pain: Secondary | ICD-10-CM | POA: Diagnosis not present

## 2020-02-18 DIAGNOSIS — M25512 Pain in left shoulder: Secondary | ICD-10-CM | POA: Diagnosis not present

## 2020-02-18 DIAGNOSIS — M79604 Pain in right leg: Secondary | ICD-10-CM | POA: Diagnosis not present

## 2020-02-18 DIAGNOSIS — M79605 Pain in left leg: Secondary | ICD-10-CM | POA: Diagnosis not present

## 2020-02-23 NOTE — Progress Notes (Signed)
Chief Complaint:   OBESITY Brandy [name] is here to discuss her progress with her obesity treatment plan along with follow-up of her obesity related diagnoses. Brandy [name] is on keeping a food journal and adhering to recommended goals of 1400-1500 calories and 90 grams of protein daily and states she is following her eating plan approximately 90% of the time. Brandy [name] states she is doing physical therapy for 45 minutes 2 times per week.  Today's visit was #: 9 Starting weight: 383 lbs Starting date: 08/25/2019 Today's weight: 355 lbs Today's date: 02/17/2020 Total lbs lost to date: 28 Total lbs lost since last in-office visit: 2  Interim History: Benelli has changed to journaling and is doing well meeting her protein and calorie goals. She is doing some protein drinks but mostly eating real food for protein.  Subjective:   1. Vitamin D deficiency Brandy [name]'s Vit D level is not yet at goal, but is slowly improving on Vit D prescription and OTC Vit D. She denies nausea or vomiting.  2. Type 2 diabetes mellitus with hyperglycemia, with long-term current use of insulin (HCC) Brandy [name] is stable on her medications, and she is working on diet and weight loss. She denies signs of hypotension.  3. At risk for hypoglycemia Brandy [name] is at increased risk for hypoglycemia due to changes in diet, diagnosis of diabetes, and/or insulin use.   Assessment/Plan:   1. Vitamin D deficiency Low Vitamin D level contributes to fatigue and are associated with obesity, breast, and colon cancer. We will refill prescription Vitamin D for 1 month, and Bridgitt agreed to increase OTC Vit D to 2,000 IU daily. She will follow-up for routine testing of Vitamin D, at least 2-3 times per year to avoid over-replacement.  - Vitamin D, Ergocalciferol, (DRISDOL) 1.25 MG (50000 UNIT) CAPS capsule; Take 1 capsule (50,000 Units total) by mouth every 7 (seven) days.  Dispense: 4 capsule; Refill: 0  2. Type 2 diabetes mellitus with hyperglycemia, with  long-term current use of insulin (HCC) Good blood sugar control is important to decrease the likelihood of diabetic complications such as nephropathy, neuropathy, limb loss, blindness, coronary artery disease, and death. Intensive lifestyle modification including diet, exercise and weight loss are the first line of treatment for diabetes. Lesette will continue diet, exercise, and weight loss, and will continue to follow closely. Her goal is to keep her sugar below 50 grams daily.  3. At risk for hypoglycemia Brandy [name] was given approximately 15 minutes of counseling today regarding prevention of hypoglycemia. She was advised of symptoms of hypoglycemia. Brandy [name] was instructed to avoid skipping meals, eat regular protein rich meals and schedule low calorie snacks as needed.   Repetitive spaced learning was employed today to elicit superior memory formation and behavioral change  4. Class 3 severe obesity with serious comorbidity and body mass index (BMI) of 50.0 to 59.9 in adult, unspecified obesity type (HCC) Brandy [name] is currently in the action stage of change. As such, her goal is to continue with weight loss efforts. She has agreed to keeping a food journal and adhering to recommended goals of 1400-1500 calories and 90+ grams of protein daily.   Exercise goals: As is.  Behavioral modification strategies: increasing lean protein intake and meal planning and cooking strategies.  Brandy [name] has agreed to follow-up with our clinic in 3 to 4 weeks. She was informed of the importance of frequent follow-up visits to maximize her success with intensive lifestyle modifications for her multiple health conditions.   Objective:  Blood pressure 138/72, pulse 98, temperature 98 F (36.7 C), height 5\' 6"  (1.676 m), weight (!) 355 lb (161 kg), SpO2 99 %. Body mass index is 57.3 kg/m.  General: Cooperative, alert, well developed, in no acute distress. HEENT: Conjunctivae and lids unremarkable. Cardiovascular: Regular rhythm.   Lungs: Normal work of breathing. Neurologic: No focal deficits.   Lab Results  Component Value Date   CREATININE 1.03 02/08/2020   BUN 32 (H) 02/08/2020   NA 131 (L) 02/08/2020   K 4.4 02/08/2020   CL 96 02/08/2020   CO2 26 02/08/2020   Lab Results  Component Value Date   ALT 12 02/08/2020   AST 12 02/08/2020   ALKPHOS 97 02/08/2020   BILITOT 0.4 02/08/2020   Lab Results  Component Value Date   HGBA1C 7.3 (A) 02/08/2020   HGBA1C 7.8 (H) 08/25/2019   HGBA1C 9.2 (A) 06/17/2019   HGBA1C 9.1 (A) 03/18/2019   HGBA1C 8.9 (A) 12/10/2018   Lab Results  Component Value Date   INSULIN 101.0 (H) 08/25/2019   Lab Results  Component Value Date   TSH 1.720 08/25/2019   Lab Results  Component Value Date   CHOL 162 08/25/2019   HDL 69 08/25/2019   LDLCALC 73 08/25/2019   LDLDIRECT 108.3 09/14/2010   TRIG 114 08/25/2019   CHOLHDL 3 03/18/2019   Lab Results  Component Value Date   WBC 12.9 (H) 10/07/2019   HGB 13.1 10/07/2019   HCT 39.5 10/07/2019   MCV 91.6 10/07/2019   PLT 399 10/07/2019   No results found for: IRON, TIBC, FERRITIN  Attestation Statements:   Reviewed by clinician on day of visit: allergies, medications, problem list, medical history, surgical history, family history, social history, and previous encounter notes.   I, 12/07/2019, am acting as transcriptionist for Burt Knack, MD.  I have reviewed the above documentation for accuracy and completeness, and I agree with the above. -  Quillian Quince, MD

## 2020-02-25 ENCOUNTER — Other Ambulatory Visit: Payer: Self-pay | Admitting: Primary Care

## 2020-02-25 ENCOUNTER — Ambulatory Visit (INDEPENDENT_AMBULATORY_CARE_PROVIDER_SITE_OTHER): Payer: BC Managed Care – PPO | Admitting: Psychology

## 2020-02-25 DIAGNOSIS — M79604 Pain in right leg: Secondary | ICD-10-CM | POA: Diagnosis not present

## 2020-02-25 DIAGNOSIS — M5459 Other low back pain: Secondary | ICD-10-CM | POA: Diagnosis not present

## 2020-02-25 DIAGNOSIS — I10 Essential (primary) hypertension: Secondary | ICD-10-CM

## 2020-02-25 DIAGNOSIS — M79605 Pain in left leg: Secondary | ICD-10-CM | POA: Diagnosis not present

## 2020-02-25 DIAGNOSIS — F3289 Other specified depressive episodes: Secondary | ICD-10-CM | POA: Diagnosis not present

## 2020-02-25 DIAGNOSIS — M25512 Pain in left shoulder: Secondary | ICD-10-CM | POA: Diagnosis not present

## 2020-02-27 ENCOUNTER — Other Ambulatory Visit: Payer: Self-pay | Admitting: Primary Care

## 2020-02-27 DIAGNOSIS — I10 Essential (primary) hypertension: Secondary | ICD-10-CM

## 2020-03-01 DIAGNOSIS — M79605 Pain in left leg: Secondary | ICD-10-CM | POA: Diagnosis not present

## 2020-03-01 DIAGNOSIS — M5459 Other low back pain: Secondary | ICD-10-CM | POA: Diagnosis not present

## 2020-03-01 DIAGNOSIS — M25512 Pain in left shoulder: Secondary | ICD-10-CM | POA: Diagnosis not present

## 2020-03-01 DIAGNOSIS — M79604 Pain in right leg: Secondary | ICD-10-CM | POA: Diagnosis not present

## 2020-03-01 NOTE — Telephone Encounter (Signed)
Last OV 02/08/20 Last fill for lisinopril 02/10/20  #90/0 Last fill for Zyrtec 11/30/19  #90/0

## 2020-03-03 ENCOUNTER — Other Ambulatory Visit (INDEPENDENT_AMBULATORY_CARE_PROVIDER_SITE_OTHER): Payer: Self-pay | Admitting: Family Medicine

## 2020-03-03 ENCOUNTER — Ambulatory Visit (INDEPENDENT_AMBULATORY_CARE_PROVIDER_SITE_OTHER): Payer: BC Managed Care – PPO | Admitting: Psychology

## 2020-03-03 ENCOUNTER — Encounter (INDEPENDENT_AMBULATORY_CARE_PROVIDER_SITE_OTHER): Payer: Self-pay

## 2020-03-03 DIAGNOSIS — F3289 Other specified depressive episodes: Secondary | ICD-10-CM | POA: Diagnosis not present

## 2020-03-03 DIAGNOSIS — E559 Vitamin D deficiency, unspecified: Secondary | ICD-10-CM

## 2020-03-03 NOTE — Telephone Encounter (Signed)
MyChart message sent to pt to find out if they have enough medication to get them through until next appt.   

## 2020-03-08 DIAGNOSIS — M79604 Pain in right leg: Secondary | ICD-10-CM | POA: Diagnosis not present

## 2020-03-08 DIAGNOSIS — F39 Unspecified mood [affective] disorder: Secondary | ICD-10-CM | POA: Diagnosis not present

## 2020-03-08 DIAGNOSIS — F411 Generalized anxiety disorder: Secondary | ICD-10-CM | POA: Diagnosis not present

## 2020-03-08 DIAGNOSIS — F5105 Insomnia due to other mental disorder: Secondary | ICD-10-CM | POA: Diagnosis not present

## 2020-03-08 DIAGNOSIS — M25512 Pain in left shoulder: Secondary | ICD-10-CM | POA: Diagnosis not present

## 2020-03-08 DIAGNOSIS — M79605 Pain in left leg: Secondary | ICD-10-CM | POA: Diagnosis not present

## 2020-03-08 DIAGNOSIS — M5459 Other low back pain: Secondary | ICD-10-CM | POA: Diagnosis not present

## 2020-03-09 ENCOUNTER — Other Ambulatory Visit (INDEPENDENT_AMBULATORY_CARE_PROVIDER_SITE_OTHER): Payer: BC Managed Care – PPO

## 2020-03-09 ENCOUNTER — Ambulatory Visit: Payer: BC Managed Care – PPO

## 2020-03-09 ENCOUNTER — Other Ambulatory Visit: Payer: Self-pay

## 2020-03-09 VITALS — BP 130/78

## 2020-03-09 DIAGNOSIS — I10 Essential (primary) hypertension: Secondary | ICD-10-CM | POA: Diagnosis not present

## 2020-03-09 DIAGNOSIS — E538 Deficiency of other specified B group vitamins: Secondary | ICD-10-CM

## 2020-03-09 MED ORDER — CYANOCOBALAMIN 1000 MCG/ML IJ SOLN: 1000.0000 ug | Freq: Once | INTRAMUSCULAR | Status: DC

## 2020-03-09 NOTE — Progress Notes (Addendum)
Pt came in for BP check. BP was 130/78 in right arm.  Epic jumped to wrong patient and B12 was entered in error.

## 2020-03-10 ENCOUNTER — Other Ambulatory Visit: Payer: Self-pay | Admitting: Primary Care

## 2020-03-10 ENCOUNTER — Ambulatory Visit (INDEPENDENT_AMBULATORY_CARE_PROVIDER_SITE_OTHER): Payer: BC Managed Care – PPO | Admitting: Psychology

## 2020-03-10 DIAGNOSIS — F3289 Other specified depressive episodes: Secondary | ICD-10-CM

## 2020-03-10 LAB — BASIC METABOLIC PANEL
BUN: 22 mg/dL (ref 6–23)
CO2: 25 mEq/L (ref 19–32)
Calcium: 9.3 mg/dL (ref 8.4–10.5)
Chloride: 100 mEq/L (ref 96–112)
Creatinine, Ser: 0.87 mg/dL (ref 0.40–1.20)
GFR: 74.34 mL/min (ref >60.00)
Glucose, Bld: 87 mg/dL (ref 70–99)
Potassium: 4.4 mEq/L (ref 3.5–5.1)
Sodium: 136 mEq/L (ref 135–145)

## 2020-03-14 ENCOUNTER — Other Ambulatory Visit: Payer: Self-pay

## 2020-03-14 ENCOUNTER — Encounter (INDEPENDENT_AMBULATORY_CARE_PROVIDER_SITE_OTHER): Payer: Self-pay | Admitting: Family Medicine

## 2020-03-14 ENCOUNTER — Ambulatory Visit (INDEPENDENT_AMBULATORY_CARE_PROVIDER_SITE_OTHER): Payer: BC Managed Care – PPO | Admitting: Family Medicine

## 2020-03-14 VITALS — BP 122/60 | HR 83 | Temp 97.8°F | Ht 66.0 in | Wt 352.0 lb

## 2020-03-14 DIAGNOSIS — E1169 Type 2 diabetes mellitus with other specified complication: Secondary | ICD-10-CM

## 2020-03-14 DIAGNOSIS — Z6841 Body Mass Index (BMI) 40.0 and over, adult: Secondary | ICD-10-CM | POA: Diagnosis not present

## 2020-03-14 NOTE — Telephone Encounter (Signed)
Dr Beasley pt 

## 2020-03-15 DIAGNOSIS — M25512 Pain in left shoulder: Secondary | ICD-10-CM | POA: Diagnosis not present

## 2020-03-15 DIAGNOSIS — M5459 Other low back pain: Secondary | ICD-10-CM | POA: Diagnosis not present

## 2020-03-15 DIAGNOSIS — M79605 Pain in left leg: Secondary | ICD-10-CM | POA: Diagnosis not present

## 2020-03-15 DIAGNOSIS — M79604 Pain in right leg: Secondary | ICD-10-CM | POA: Diagnosis not present

## 2020-03-15 NOTE — Progress Notes (Signed)
Chief Complaint:   OBESITY Brandy [name] is here to discuss her progress with her obesity treatment plan along with follow-up of her obesity related diagnoses. Brandy [name] is on keeping a food journal and adhering to recommended goals of 1400-1500 calories and 90+ grams of protein daily and states she is following her eating plan approximately 90% of the time. [name] states she is doing physical therapy for 45-60 minutes 1 time per week.  Today's visit was #: 10 Starting weight: 383 lbs Starting date: 08/25/2019 Today's weight: 352 lbs Today's date: 03/14/2020 Total lbs lost to date: 31 Total lbs lost since last in-office visit: 3  Interim History: [name] continues to do well with weight loss with journaling. She is doing well being mindful of her food choices, and she is journaling most days. She is open to discussing holiday eating strategies.  Subjective:   1. Type 2 diabetes mellitus with other specified complication, without long-term current use of insulin (HCC) Brandy [name] is working on diet and weight loss. She is stable on her medications and she has only noticed 1 episode of hypoglycemia. She is now off her insulin. She notes decrease polyphagia.  Assessment/Plan:   1. Type 2 diabetes mellitus with other specified complication, without long-term current use of insulin (HCC) Good blood sugar control is important to decrease the likelihood of diabetic complications such as nephropathy, neuropathy, limb loss, blindness, coronary artery disease, and death. Intensive lifestyle modification including diet, exercise and weight loss are the first line of treatment for diabetes. Brandy [name] will continue metformin and glipizide with goal to discontinue glipizide after her A1c drops to 6.5 or lower.  2. Class 3 severe obesity with serious comorbidity and body mass index (BMI) of 50.0 to 59.9 in adult, unspecified obesity type (HCC) Brandy [name] is currently in the action stage of change. As such, her goal is to continue with  weight loss efforts. She has agreed to the Category 3 Plan or keeping a food journal and adhering to recommended goals of 1400-1500 calories and 90+ grams of protein daily.   Exercise goals: As is.  Behavioral modification strategies: meal planning and cooking strategies and holiday eating strategies .  Brandy [name] has agreed to follow-up with our clinic in 3 to 4 weeks. She was informed of the importance of frequent follow-up visits to maximize her success with intensive lifestyle modifications for her multiple health conditions.   Objective:   Blood pressure 122/60, pulse 83, temperature 97.8 F (36.6 C), height 5\' 6"  (1.676 m), weight (!) 352 lb (159.7 kg), SpO2 98 %. Body mass index is 56.81 kg/m.  General: Cooperative, alert, well developed, in no acute distress. HEENT: Conjunctivae and lids unremarkable. Cardiovascular: Regular rhythm.  Lungs: Normal work of breathing. Neurologic: No focal deficits.   Lab Results  Component Value Date   CREATININE 0.87 03/09/2020   BUN 22 03/09/2020   NA 136 03/09/2020   K 4.4 03/09/2020   CL 100 03/09/2020   CO2 25 03/09/2020   Lab Results  Component Value Date   ALT 12 02/08/2020   AST 12 02/08/2020   ALKPHOS 97 02/08/2020   BILITOT 0.4 02/08/2020   Lab Results  Component Value Date   HGBA1C 7.3 (A) 02/08/2020   HGBA1C 7.8 (H) 08/25/2019   HGBA1C 9.2 (A) 06/17/2019   HGBA1C 9.1 (A) 03/18/2019   HGBA1C 8.9 (A) 12/10/2018   Lab Results  Component Value Date   INSULIN 101.0 (H) 08/25/2019   Lab Results  Component Value Date  TSH 1.720 08/25/2019   Lab Results  Component Value Date   CHOL 162 08/25/2019   HDL 69 08/25/2019   LDLCALC 73 08/25/2019   LDLDIRECT 108.3 09/14/2010   TRIG 114 08/25/2019   CHOLHDL 3 03/18/2019   Lab Results  Component Value Date   WBC 12.9 (H) 10/07/2019   HGB 13.1 10/07/2019   HCT 39.5 10/07/2019   MCV 91.6 10/07/2019   PLT 399 10/07/2019   No results found for: IRON, TIBC,  FERRITIN  Attestation Statements:   Reviewed by clinician on day of visit: allergies, medications, problem list, medical history, surgical history, family history, social history, and previous encounter notes.  Time spent on visit including pre-visit chart review and post-visit care and charting was 31 minutes.    I, Burt Knack, am acting as transcriptionist for Quillian Quince, MD.  I have reviewed the above documentation for accuracy and completeness, and I agree with the above. -  Quillian Quince, MD

## 2020-03-17 ENCOUNTER — Ambulatory Visit (INDEPENDENT_AMBULATORY_CARE_PROVIDER_SITE_OTHER): Payer: BC Managed Care – PPO | Admitting: Psychology

## 2020-03-17 DIAGNOSIS — M5459 Other low back pain: Secondary | ICD-10-CM | POA: Diagnosis not present

## 2020-03-17 DIAGNOSIS — M79605 Pain in left leg: Secondary | ICD-10-CM | POA: Diagnosis not present

## 2020-03-17 DIAGNOSIS — F3289 Other specified depressive episodes: Secondary | ICD-10-CM

## 2020-03-17 DIAGNOSIS — M79604 Pain in right leg: Secondary | ICD-10-CM | POA: Diagnosis not present

## 2020-03-17 DIAGNOSIS — M25512 Pain in left shoulder: Secondary | ICD-10-CM | POA: Diagnosis not present

## 2020-03-28 ENCOUNTER — Telehealth: Payer: Self-pay | Admitting: Adult Health

## 2020-03-28 NOTE — Telephone Encounter (Signed)
Patient is scheduled for a follow up tomorrow with Aundra Millet at 8am. I checked [employer] and [employer] and did not see any information for patient. Called and spoke with patient. She stated that she has not received her cpap machine but she still wanted to come in and talk to [facility] about the process.   Will keep her appt as is.

## 2020-03-28 NOTE — Progress Notes (Deleted)
PATIENT: Brandy [name] DOB: [birth year]  REASON FOR VISIT: follow up HISTORY FROM: patient  HISTORY OF PRESENT ILLNESS: Today 03/28/20  HISTORY Chiefconcernaccording to patient : [name] has reported difficulties with multitasking, her level of alertness, ability to concentrate and word finding delays at least.  This seems to have gone on since the later half of 2019.  Her recent referral describes her as a 54 year old female with morbid obesity, chronic pain, diabetes poorly controlled, hyperlipidemia, and new onset of recent onset of short-term memory difficulties and excessive daytime sleepiness.  She has been evaluated by neurosurgery ( not sure which physician has seen her )  a CT of the head was completed showing some frontal atrophy, interestingly no EMG or nerve conduction study was obtained.  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   She also reports twitching or jerking movements in her lower extremities and pain when she stands-  Diabetic neuropathy is managed by PCP.   She has also diagnosis of chronic vitamin D deficiency despite supplement intake.  Her fatigue has not improved since she is taking vitamin D.  Her sleepiness has slightly improved as she is not taking quite as many naps and as 7 in onset as she used to take last year.  Her husband is very concerned about these abrupt starts of sleepiness attacks.  Below is an ANA titer was borderline positive, a rheumatological work-up is pending at this time.  " I had a sleep study years ago- 03/2018 (?) and was sleepy -but no explanation was found. This was an in -lab-study at [facility]" She was unable to comply with her pulmonologist request to track her sleep through apps.   I have the pleasure of seeing [name] today,a right -handed White or Caucasian female with a possible sleep disorder. She  has a past medical history of Anxiety, Depression, Diabetes mellitus without  complication (HCC), Dysmetabolic syndrome X, Hypertension, Hypertriglyceridemia, Obesity, and Tetanus vaccine causing adverse effect in therapeutic use.    Sleeprelevant medical history: Nocturia 4 times, dreams but has not acted dreams out.   cervical spine DDD, and shoulder pain.  " My body is pain "  Familymedical /sleep history:no other family member on CPAP with OSA, insomnia, no sleep walkers. her son has a son with severe autism spectrum disorder.   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 -  she lives in a household with 4 persons, husband and 2 sons.   Pets are not  present. Tobacco use; never - ETOH use: 2 a year.Caffeine intake in form of Coffee( 2 cups in AM ) Soda( 4 cans a day / PM) Tea ( if not drinking sodas) . She doesn' t use energy drinks. Regular exercise in form of PT tice a week, stopped at christmas. Now in PT with pulmonary group.      Sleep habits are as follows:The patient's dinner time is between 7 PM. The patient goes to bed  To watch Tv and falls asleep at midight and continues to sleep for 2-3  hours, wakes for several  bathroom breaks. The preferred sleep position is left sided with the support of 2 pillows. Dreams are reportedly infrequent.  9.30 AM is the usual rise time.The patient wakes up spontaneously at 7- 9 AM. She reports not feeling refreshed or restored in AM, with symptoms such as dry mouth , overall aching and soreness, and residual  fatigue.  Naps are taken infrequently, lasting from 2-3 and are more refreshing than nocturnal sleep.     REVIEW OF SYSTEMS: Out of a complete 14 system review of symptoms, the patient complains only of the following symptoms, and all other reviewed systems are negative.  FSS ESS  ALLERGIES: Allergies  Allergen Reactions  . Diphenhydramine Hcl Rash    minor rash  . Penicillins Rash    HOME MEDICATIONS: Outpatient Medications Prior to Visit   Medication Sig Dispense Refill  . acetaminophen (TYLENOL) 650 MG CR tablet Take 1,950 mg by mouth every 8 (eight) hours as needed for pain.     Marland Kitchen aspirin EC 81 MG tablet Take 81 mg by mouth daily.    Marland Kitchen atorvastatin (LIPITOR) 10 MG tablet Take 1 tablet (10 mg total) by mouth daily. For cholesterol. 90 tablet 3  . celecoxib (CELEBREX) 100 MG capsule TAKE 1 CAPSULE (100 MG TOTAL) BY MOUTH 2 (TWO) TIMES DAILY. AS NEEDED FOR PAIN. 180 capsule 1  . cetirizine (ZYRTEC) 10 MG tablet TAKE 1 TABLET BY MOUTH EVERY DAY 90 tablet 0  . cholecalciferol (VITAMIN D3) 25 MCG (1000 UT) tablet Take 2,000 Units by mouth 4 (four) times a week. Take on the days your not taking 16010 unit vitamin d    . citalopram (CELEXA) 20 MG tablet Take 40 mg by mouth daily.     . DULoxetine (CYMBALTA) 30 MG capsule Take 30 mg by mouth daily. Take with 60 mg to equal 90 mg daily    . DULoxetine (CYMBALTA) 60 MG capsule Take 60 mg by mouth daily. Take with 30 mg to equal 90 mg daily    . folic acid (FOLVITE) 1 MG tablet Take 1 mg by mouth daily.    Marland Kitchen gabapentin (NEURONTIN) 300 MG capsule Take 1 capsule (300 mg total) by mouth 3 (three) times daily. For back pain. (Patient taking differently: Take 300 mg by mouth See admin instructions. Take 300 mg 3 times daily, may take a 4th 300 mg dose as needed for pain) 270 capsule 3  . glipiZIDE (GLUCOTROL) 10 MG tablet TAKE 1 TABLET (10 MG TOTAL) BY MOUTH 2 (TWO) TIMES DAILY BEFORE A MEAL. FOR DIABETES. 180 tablet 3  . hydrocortisone 2.5 % cream Apply 1 application topically 2 (two) times daily.     . Insulin Pen Needle (PEN NEEDLES) 31G X 6 MM MISC Use nightly with insulin. 100 each 2  . lamoTRIgine (LAMICTAL) 150 MG tablet Take 150 mg by mouth daily.    Marland Kitchen lisinopril (ZESTRIL) 30 MG tablet Take 1 tablet (30 mg total) by mouth daily. For blood pressure 90 tablet 0  . metFORMIN (GLUCOPHAGE-XR) 500 MG 24 hr tablet TAKE 2 TABLETS (1,000 MG TOTAL) BY MOUTH DAILY WITH BREAKFAST. FOR DIABETES. 180  tablet 1  . metroNIDAZOLE (METROCREAM) 0.75 % cream Apply 1 application topically daily.    . traZODone (DESYREL) 50 MG tablet Take 25-50 mg by mouth at bedtime as needed for sleep.     . Vitamin D, Ergocalciferol, (DRISDOL) 1.25 MG (50000 UNIT) CAPS capsule Take 1 capsule (50,000 Units total) by mouth every 7 (seven) days. 4 capsule 0   Facility-Administered Medications Prior to Visit  Medication Dose Route Frequency Provider Last Rate Last Admin  . cyanocobalamin ((VITAMIN B-12)) injection 1,000 mcg  1,000 mcg Intramuscular Once Doreene Nest, NP        PAST MEDICAL HISTORY: Past Medical History:  Diagnosis Date  . Anxiety   . Arthritis   .  Back pain   . Brain fog   . CHF (congestive heart failure) (HCC)    pt not aware of this  . Depression   . Diabetes mellitus without complication (HCC)   . Difficulty walking   . Dysmetabolic syndrome X   . Dyspnea   . Fatigue   . Fibromyalgia   . Hypertension   . Hypertriglyceridemia   . Joint pain   . Leg weakness   . Lower extremity edema   . Muscle atrophy   . Neuropathy   . Obesity   . Osteoarthritis   . PONV (postoperative nausea and vomiting)   . Poor memory   . Post-menopausal bleeding   . Prediabetes   . Rosacea   . Sleep apnea    not on cpap yet  . Tetanus vaccine causing adverse effect in therapeutic use   . Vitamin D deficiency     PAST SURGICAL HISTORY: Past Surgical History:  Procedure Laterality Date  . 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 DILATATION & CURETTAGE/HYSTEROSCOPY WITH MYOSURE N/A 10/07/2019   Procedure: DILATATION AND CURETTAGE /HYSTEROSCOPY, Myosure, Polypectomy;  Surgeon: [place] Bing, MD;  Location: [facility] OR;  Service: Gynecology;  Laterality: N/A;  . ENDOMETRIAL BIOPSY  07/14/2019      . epidural steroid injection      FAMILY HISTORY: Family History  Problem Relation Age of Onset  . Rheum arthritis Mother   . COPD Mother   . Stroke Mother   . Depression Mother   . Anxiety  disorder Mother   . Eating disorder Mother   . Obesity Mother   . Hypertension Father   . Depression Father   . Alcoholism Father   . Heart disease Maternal Grandmother   . Stroke Maternal Grandmother   . Alcohol abuse Maternal Grandmother   . Alcohol abuse Maternal Grandfather   . Aneurysm Paternal Grandmother   . Autism Son        severe  . Diabetes Paternal Aunt   . Breast cancer Neg Hx     SOCIAL HISTORY: Social History   Socioeconomic History  . Marital status: Married    Spouse name: Ed  . Number of children: 3  . Years of education: Not on file  . Highest education level: Not on file  Occupational History  . Occupation: Immunologist: [employer]  Tobacco Use  . Smoking status: Never Smoker  . Smokeless tobacco: Never Used  Vaping Use  . Vaping Use: Never used  Substance and Sexual Activity  . Alcohol use: Yes    Comment: rarely - once a year  . Drug use: No  . Sexual activity: Not on file  Other Topics Concern  . Not on file  Social History Narrative   Best boy      Married; 3 kids (middle child-severely autistic)      No regular exercise   Social Determinants of Health   Financial Resource Strain:   . Difficulty of Paying Living Expenses: Not on file  Food Insecurity:   . Worried About Programme researcher, broadcasting/film/video in the Last Year: Not on file  . Ran Out of Food in the Last Year: Not on file  Transportation Needs:   . Lack of Transportation (Medical): Not on file  . Lack of Transportation (Non-Medical): Not on file  Physical Activity:   . Days of Exercise per Week: Not on file  . Minutes of Exercise per  Session: Not on file  Stress:   . Feeling of Stress : Not on file  Social Connections:   . Frequency of Communication with Friends and Family: Not on file  . Frequency of Social Gatherings with Friends and Family: Not on file  . Attends Religious Services: Not on file  . Active Member of Clubs or  Organizations: Not on file  . Attends BankerClub or Organization Meetings: Not on file  . Marital Status: Not on file  Intimate Partner Violence:   . Fear of Current or Ex-Partner: Not on file  . Emotionally Abused: Not on file  . Physically Abused: Not on file  . Sexually Abused: Not on file      PHYSICAL EXAM  There were no vitals filed for this visit. There is no height or weight on file to calculate BMI.  Generalized: Well developed, in no acute distress  Chest: Lungs clear to auscultation bilaterally  Neurological examination  Mentation: Alert oriented to time, place, history taking. Follows all commands speech and language fluent Cranial nerve II-XII: Extraocular movements were full, visual field were full on confrontational test Head turning and shoulder shrug  were normal and symmetric. Motor: The motor testing reveals 5 over 5 strength of all 4 extremities. Good symmetric motor tone is noted throughout.  Sensory: Sensory testing is intact to soft touch on all 4 extremities. No evidence of extinction is noted.  Gait and station: Gait is normal.    DIAGNOSTIC DATA (LABS, IMAGING, TESTING) - I reviewed patient records, labs, notes, testing and imaging myself where available.  Lab Results  Component Value Date   WBC 12.9 (H) 10/07/2019   HGB 13.1 10/07/2019   HCT 39.5 10/07/2019   MCV 91.6 10/07/2019   PLT 399 10/07/2019      Component Value Date/Time   NA 136 03/09/2020 1544   NA 136 08/25/2019 1337   K 4.4 03/09/2020 1544   CL 100 03/09/2020 1544   CO2 25 03/09/2020 1544   GLUCOSE 87 03/09/2020 1544   BUN 22 03/09/2020 1544   BUN 22 08/25/2019 1337   CREATININE 0.87 03/09/2020 1544   CALCIUM 9.3 03/09/2020 1544   PROT 8.0 02/08/2020 1236   PROT 7.0 08/25/2019 1337   ALBUMIN 4.2 02/08/2020 1236   ALBUMIN 4.0 08/25/2019 1337   AST 12 02/08/2020 1236   ALT 12 02/08/2020 1236   ALKPHOS 97 02/08/2020 1236   BILITOT 0.4 02/08/2020 1236   BILITOT 0.3 08/25/2019  1337   GFRNONAA >60 10/07/2019 0658   GFRAA >60 10/07/2019 0658   Lab Results  Component Value Date   CHOL 162 08/25/2019   HDL 69 08/25/2019   LDLCALC 73 08/25/2019   LDLDIRECT 108.3 09/14/2010   TRIG 114 08/25/2019   CHOLHDL 3 03/18/2019   Lab Results  Component Value Date   HGBA1C 7.3 (A) 02/08/2020   Lab Results  Component Value Date   VITAMINB12 721 02/08/2020   Lab Results  Component Value Date   TSH 1.720 08/25/2019      ASSESSMENT AND PLAN 54 y.o. year old female  has a past medical history of Anxiety, Arthritis, Back pain, Brain fog, CHF (congestive heart failure) (HCC), Depression, Diabetes mellitus without complication (HCC), Difficulty walking, Dysmetabolic syndrome X, Dyspnea, Fatigue, Fibromyalgia, Hypertension, Hypertriglyceridemia, Joint pain, Leg weakness, Lower extremity edema, Muscle atrophy, Neuropathy, Obesity, Osteoarthritis, PONV (postoperative nausea and vomiting), Poor memory, Post-menopausal bleeding, Prediabetes, Rosacea, Sleep apnea, Tetanus vaccine causing adverse effect in therapeutic use, and Vitamin D deficiency.  here with:  1. OSA on CPAP  - CPAP compliance excellent - Good treatment of AHI  - Encourage patient to use CPAP nightly and > 4 hours each night - F/U in 1 year or sooner if needed   I spent *** minutes of face-to-face and non-face-to-face time with patient.  This included previsit chart review, lab review, study review, order entry, electronic health record documentation, patient education.  Butch Penny, MSN, NP-C 03/28/2020, 3:55 PM [facility] [address] [place], [place] [address] [phone]

## 2020-03-29 ENCOUNTER — Ambulatory Visit: Payer: BC Managed Care – PPO | Admitting: Adult Health

## 2020-03-29 ENCOUNTER — Encounter: Payer: Self-pay | Admitting: Adult Health

## 2020-03-31 ENCOUNTER — Ambulatory Visit (INDEPENDENT_AMBULATORY_CARE_PROVIDER_SITE_OTHER): Payer: BC Managed Care – PPO | Admitting: Psychology

## 2020-03-31 DIAGNOSIS — F3289 Other specified depressive episodes: Secondary | ICD-10-CM

## 2020-04-02 ENCOUNTER — Other Ambulatory Visit (INDEPENDENT_AMBULATORY_CARE_PROVIDER_SITE_OTHER): Payer: Self-pay | Admitting: Family Medicine

## 2020-04-02 DIAGNOSIS — E559 Vitamin D deficiency, unspecified: Secondary | ICD-10-CM

## 2020-04-04 ENCOUNTER — Ambulatory Visit (INDEPENDENT_AMBULATORY_CARE_PROVIDER_SITE_OTHER): Payer: BC Managed Care – PPO | Admitting: Family Medicine

## 2020-04-04 DIAGNOSIS — F39 Unspecified mood [affective] disorder: Secondary | ICD-10-CM | POA: Diagnosis not present

## 2020-04-04 DIAGNOSIS — F5105 Insomnia due to other mental disorder: Secondary | ICD-10-CM | POA: Diagnosis not present

## 2020-04-04 DIAGNOSIS — F411 Generalized anxiety disorder: Secondary | ICD-10-CM | POA: Diagnosis not present

## 2020-04-05 ENCOUNTER — Encounter: Payer: Self-pay | Admitting: Primary Care

## 2020-04-06 ENCOUNTER — Ambulatory Visit (INDEPENDENT_AMBULATORY_CARE_PROVIDER_SITE_OTHER): Payer: BC Managed Care – PPO | Admitting: Family Medicine

## 2020-04-06 ENCOUNTER — Encounter (INDEPENDENT_AMBULATORY_CARE_PROVIDER_SITE_OTHER): Payer: Self-pay

## 2020-04-07 ENCOUNTER — Ambulatory Visit (INDEPENDENT_AMBULATORY_CARE_PROVIDER_SITE_OTHER): Payer: BC Managed Care – PPO | Admitting: Psychology

## 2020-04-07 DIAGNOSIS — F3289 Other specified depressive episodes: Secondary | ICD-10-CM

## 2020-04-12 ENCOUNTER — Other Ambulatory Visit (INDEPENDENT_AMBULATORY_CARE_PROVIDER_SITE_OTHER): Payer: Self-pay | Admitting: Family Medicine

## 2020-04-12 DIAGNOSIS — E559 Vitamin D deficiency, unspecified: Secondary | ICD-10-CM

## 2020-04-14 ENCOUNTER — Ambulatory Visit (INDEPENDENT_AMBULATORY_CARE_PROVIDER_SITE_OTHER): Payer: BC Managed Care – PPO | Admitting: Psychology

## 2020-04-14 DIAGNOSIS — F3289 Other specified depressive episodes: Secondary | ICD-10-CM | POA: Diagnosis not present

## 2020-04-21 ENCOUNTER — Ambulatory Visit: Payer: BC Managed Care – PPO | Admitting: Psychology

## 2020-04-26 ENCOUNTER — Other Ambulatory Visit: Payer: Self-pay | Admitting: Primary Care

## 2020-04-26 DIAGNOSIS — I10 Essential (primary) hypertension: Secondary | ICD-10-CM

## 2020-04-28 ENCOUNTER — Ambulatory Visit: Payer: BC Managed Care – PPO | Admitting: Psychology

## 2020-04-28 DIAGNOSIS — F411 Generalized anxiety disorder: Secondary | ICD-10-CM | POA: Diagnosis not present

## 2020-04-28 DIAGNOSIS — F39 Unspecified mood [affective] disorder: Secondary | ICD-10-CM | POA: Diagnosis not present

## 2020-04-28 DIAGNOSIS — F5105 Insomnia due to other mental disorder: Secondary | ICD-10-CM | POA: Diagnosis not present

## 2020-05-05 ENCOUNTER — Ambulatory Visit (INDEPENDENT_AMBULATORY_CARE_PROVIDER_SITE_OTHER): Payer: BC Managed Care – PPO | Admitting: Psychology

## 2020-05-05 DIAGNOSIS — F3289 Other specified depressive episodes: Secondary | ICD-10-CM | POA: Diagnosis not present

## 2020-05-07 ENCOUNTER — Other Ambulatory Visit: Payer: Self-pay

## 2020-05-12 ENCOUNTER — Ambulatory Visit (INDEPENDENT_AMBULATORY_CARE_PROVIDER_SITE_OTHER): Payer: BC Managed Care – PPO | Admitting: Psychology

## 2020-05-12 DIAGNOSIS — F3289 Other specified depressive episodes: Secondary | ICD-10-CM

## 2020-05-19 ENCOUNTER — Ambulatory Visit: Payer: BC Managed Care – PPO | Admitting: Psychology

## 2020-05-24 DIAGNOSIS — M79604 Pain in right leg: Secondary | ICD-10-CM | POA: Diagnosis not present

## 2020-05-24 DIAGNOSIS — M25512 Pain in left shoulder: Secondary | ICD-10-CM | POA: Diagnosis not present

## 2020-05-24 DIAGNOSIS — M79605 Pain in left leg: Secondary | ICD-10-CM | POA: Diagnosis not present

## 2020-05-24 DIAGNOSIS — M5459 Other low back pain: Secondary | ICD-10-CM | POA: Diagnosis not present

## 2020-05-26 ENCOUNTER — Ambulatory Visit (INDEPENDENT_AMBULATORY_CARE_PROVIDER_SITE_OTHER): Payer: BC Managed Care – PPO | Admitting: Psychology

## 2020-05-26 DIAGNOSIS — M5459 Other low back pain: Secondary | ICD-10-CM | POA: Diagnosis not present

## 2020-05-26 DIAGNOSIS — M79605 Pain in left leg: Secondary | ICD-10-CM | POA: Diagnosis not present

## 2020-05-26 DIAGNOSIS — F3289 Other specified depressive episodes: Secondary | ICD-10-CM

## 2020-05-26 DIAGNOSIS — M79604 Pain in right leg: Secondary | ICD-10-CM | POA: Diagnosis not present

## 2020-05-26 DIAGNOSIS — M25512 Pain in left shoulder: Secondary | ICD-10-CM | POA: Diagnosis not present

## 2020-05-31 DIAGNOSIS — M79604 Pain in right leg: Secondary | ICD-10-CM | POA: Diagnosis not present

## 2020-05-31 DIAGNOSIS — M79605 Pain in left leg: Secondary | ICD-10-CM | POA: Diagnosis not present

## 2020-05-31 DIAGNOSIS — M25512 Pain in left shoulder: Secondary | ICD-10-CM | POA: Diagnosis not present

## 2020-05-31 DIAGNOSIS — M5459 Other low back pain: Secondary | ICD-10-CM | POA: Diagnosis not present

## 2020-06-01 ENCOUNTER — Ambulatory Visit (INDEPENDENT_AMBULATORY_CARE_PROVIDER_SITE_OTHER): Payer: BC Managed Care – PPO | Admitting: Primary Care

## 2020-06-01 ENCOUNTER — Other Ambulatory Visit: Payer: Self-pay

## 2020-06-01 ENCOUNTER — Encounter: Payer: Self-pay | Admitting: Primary Care

## 2020-06-01 VITALS — BP 124/78 | HR 104 | Temp 97.5°F | Ht 66.0 in | Wt 357.0 lb

## 2020-06-01 DIAGNOSIS — Z1211 Encounter for screening for malignant neoplasm of colon: Secondary | ICD-10-CM

## 2020-06-01 DIAGNOSIS — E559 Vitamin D deficiency, unspecified: Secondary | ICD-10-CM

## 2020-06-01 DIAGNOSIS — F411 Generalized anxiety disorder: Secondary | ICD-10-CM

## 2020-06-01 DIAGNOSIS — M5442 Lumbago with sciatica, left side: Secondary | ICD-10-CM

## 2020-06-01 DIAGNOSIS — I10 Essential (primary) hypertension: Secondary | ICD-10-CM

## 2020-06-01 DIAGNOSIS — G8929 Other chronic pain: Secondary | ICD-10-CM

## 2020-06-01 DIAGNOSIS — E781 Pure hyperglyceridemia: Secondary | ICD-10-CM

## 2020-06-01 DIAGNOSIS — M2550 Pain in unspecified joint: Secondary | ICD-10-CM

## 2020-06-01 DIAGNOSIS — E785 Hyperlipidemia, unspecified: Secondary | ICD-10-CM

## 2020-06-01 DIAGNOSIS — E538 Deficiency of other specified B group vitamins: Secondary | ICD-10-CM

## 2020-06-01 DIAGNOSIS — E1169 Type 2 diabetes mellitus with other specified complication: Secondary | ICD-10-CM

## 2020-06-01 DIAGNOSIS — F39 Unspecified mood [affective] disorder: Secondary | ICD-10-CM | POA: Diagnosis not present

## 2020-06-01 DIAGNOSIS — F339 Major depressive disorder, recurrent, unspecified: Secondary | ICD-10-CM

## 2020-06-01 DIAGNOSIS — I739 Peripheral vascular disease, unspecified: Secondary | ICD-10-CM

## 2020-06-01 DIAGNOSIS — R413 Other amnesia: Secondary | ICD-10-CM

## 2020-06-01 DIAGNOSIS — R29898 Other symptoms and signs involving the musculoskeletal system: Secondary | ICD-10-CM

## 2020-06-01 DIAGNOSIS — F5105 Insomnia due to other mental disorder: Secondary | ICD-10-CM | POA: Diagnosis not present

## 2020-06-01 DIAGNOSIS — Z1231 Encounter for screening mammogram for malignant neoplasm of breast: Secondary | ICD-10-CM

## 2020-06-01 LAB — CBC
HCT: 37.3 % (ref 36.0–46.0)
Hemoglobin: 12.5 g/dL (ref 12.0–15.0)
MCHC: 33.4 g/dL (ref 30.0–36.0)
MCV: 89.7 fl (ref 78.0–100.0)
Platelets: 367 10*3/uL (ref 150.0–400.0)
RBC: 4.16 Mil/uL (ref 3.87–5.11)
RDW: 13.3 % (ref 11.5–15.5)
WBC: 10 10*3/uL (ref 4.0–10.5)

## 2020-06-01 LAB — COMPREHENSIVE METABOLIC PANEL
ALT: 9 U/L (ref 0–35)
AST: 10 U/L (ref 0–37)
Albumin: 4.1 g/dL (ref 3.5–5.2)
Alkaline Phosphatase: 99 U/L (ref 39–117)
BUN: 24 mg/dL — ABNORMAL HIGH (ref 6–23)
CO2: 27 mEq/L (ref 19–32)
Calcium: 9.6 mg/dL (ref 8.4–10.5)
Chloride: 99 mEq/L (ref 96–112)
Creatinine, Ser: 0.91 mg/dL (ref 0.40–1.20)
GFR: 70.33 mL/min (ref >60.00)
Glucose, Bld: 169 mg/dL — ABNORMAL HIGH (ref 70–99)
Potassium: 5.1 mEq/L (ref 3.5–5.1)
Sodium: 135 mEq/L (ref 135–145)
Total Bilirubin: 0.5 mg/dL (ref 0.2–1.2)
Total Protein: 7 g/dL (ref 6.0–8.3)

## 2020-06-01 LAB — LIPID PANEL
Cholesterol: 137 mg/dL (ref 0–200)
HDL: 57.3 mg/dL (ref >39.00)
LDL Cholesterol: 49 mg/dL (ref 0–99)
NonHDL: 79.81
Total CHOL/HDL Ratio: 2
Triglycerides: 152 mg/dL — ABNORMAL HIGH (ref 0.0–149.0)
VLDL: 30.4 mg/dL (ref 0.0–40.0)

## 2020-06-01 LAB — POCT GLYCOSYLATED HEMOGLOBIN (HGB A1C): Hemoglobin A1C: 7 % — AB (ref 4.0–5.6)

## 2020-06-01 LAB — VITAMIN D 25 HYDROXY (VIT D DEFICIENCY, FRACTURES): VITD: 26.61 ng/mL — ABNORMAL LOW (ref 30.00–100.00)

## 2020-06-01 LAB — VITAMIN B12: Vitamin B-12: 521 pg/mL (ref 211–911)

## 2020-06-01 NOTE — Assessment & Plan Note (Signed)
Improving! Her mood today appeared much better overall.  Continue to follow with psychiatry. Continue Lamictal 250 mg in divided doses, citalopram 40 mg, duloxetine 90 mg. She hardly uses trazodone, use if needed.

## 2020-06-01 NOTE — Assessment & Plan Note (Signed)
Well-controlled in the office today, continue lisinopril 30 mg daily. No longer on HCTZ due to hyponatremia.  Repeat electrolytes and renal function pending.

## 2020-06-01 NOTE — Assessment & Plan Note (Signed)
Seems to be improving. I did encourage her to limit screen time during the day and focus attention on books, puzzles, word searches, etc.

## 2020-06-01 NOTE — Patient Instructions (Addendum)
Stop by the lab prior to leaving today. I will notify you of your results once received.   Continue to push yourself with physical therapy, great job!  Continue to work on a healthy diet. Ensure you are consuming 64 ounces of water daily.  Return the Cologuard Kit through the mail once complete.   [facility] to schedule your mammogram.   Please schedule a follow up appointment in 3 months.  It was a pleasure to see you today! I am SO proud of you!

## 2020-06-01 NOTE — Progress Notes (Signed)
Subjective:    Patient ID: Brandy [name], female    DOB: Oct 21, 1965, 55 y.o.   MRN: [id number]  HPI  This visit occurred during the SARS-CoV-2 public health emergency.  Safety protocols were in place, including screening questions prior to the visit, additional usage of staff PPE, and extensive cleaning of exam room while observing appropriate contact time as indicated for disinfecting solutions.   [name] is a very pleasant 55 year old female with a history of hypertensio, PAD, CHF, OSA, type 2 diabetes, chronic back pain, MDD, anxiety, chronic fatigue, chronic joint pain, poor short term memory who presents today for follow up of chronic conditions. She has never undergone colonoscopy, would be willing to undergo Cologuard. She will be due for her mammogram soon.  1) Type 2 Diabetes:  Current medications include: Metformin XR 1000 mg daily, Glipizide 10 mg BID.   She is checking her blood glucose 0 times daily.   Last A1C: 7.3 in October 2021, 7.0 today Last Eye Exam: Due, she will schedule  Last Foot Exam: Due Pneumonia Vaccination: UTD ACE/ARB: ACE-I Statin: atorvastatin   She continues to work on an improved diet, following with healthy weight and wellness center in [place] but has not been recently. She does plan on scheduling another appointment. She has been increasing her activity through physical therapy, her physical therapist has been pushing her activity level.  Wt Readings from Last 3 Encounters:  06/01/20 (!) 357 lb (161.9 kg)  03/14/20 (!) 352 lb (159.7 kg)  02/17/20 (!) 355 lb (161 kg)     2) MDD/Anxiety: Currently following with psychiatry, Dr. Maryruth Bun, and is managed on citalopram 40 mg, duloxetine 90 mg, Trazodone 50 mg at times, Lamictal 250 mg daily (200 in AM and 50 mg in PM). Overall she feels well managed on this regimen, she's noticed a "huge" difference overall. She has noticed that her "lows" are not as low, "not crashing and burning" with  depression symptoms. She is feeling more optimistic and upbeat, slight reduction in anxiety. Her concentration has improved, is feeling less "foggy".   3) Chronic Back Pain/Chronic Pain: Currently managed on gabapentin 300 mg TID, sometimes 300 mg QID if needed, Celebrex 100 mg BID PRN, duloxetine 90 mg, Tylenol 1950 mg as needed.   She continues to experience chronic low back pain, is working with outpatient physical therapy, is no longer doing aquatic PT. She has noticed some imbalance, is doing more standing exercises during her sessions, is able to participate in standing exercises for about 2-3 minutes before needing to sit. Previously she could stand for about 30-60 seconds.  She is doing some home exercises. She does plan on jointing the gym.   BP Readings from Last 3 Encounters:  06/01/20 124/78  03/14/20 122/60  03/09/20 130/78       Review of Systems  Respiratory: Negative for shortness of breath.   Cardiovascular: Negative for chest pain.  Musculoskeletal: Positive for arthralgias and back pain.  Neurological: Negative for numbness.  Psychiatric/Behavioral:       See HPI       Past Medical History:  Diagnosis Date  . Anxiety   . Arthritis   . Back pain   . Brain fog   . CHF (congestive heart failure) (HCC)    pt not aware of this  . Depression   . Diabetes mellitus without complication (HCC)   . Difficulty walking   . Dysmetabolic syndrome X   . Dyspnea   .  Fatigue   . Fibromyalgia   . Hypertension   . Hypertriglyceridemia   . Joint pain   . Leg weakness   . Lower extremity edema   . Muscle atrophy   . Neuropathy   . Obesity   . Osteoarthritis   . PONV (postoperative nausea and vomiting)   . Poor memory   . Post-menopausal bleeding   . Prediabetes   . Rosacea   . Sleep apnea    not on cpap yet  . Tetanus vaccine causing adverse effect in therapeutic use   . Vitamin D deficiency      Social History   Socioeconomic History  . Marital status:  Married    Spouse name: Ed  . Number of children: 3  . Years of education: Not on file  . Highest education level: Not on file  Occupational History  . Occupation: Immunologist: [employer]  Tobacco Use  . Smoking status: Never Smoker  . Smokeless tobacco: Never Used  Vaping Use  . Vaping Use: Never used  Substance and Sexual Activity  . Alcohol use: Yes    Comment: rarely - once a year  . Drug use: No  . Sexual activity: Not on file  Other Topics Concern  . Not on file  Social History Narrative   Best boy      Married; 3 kids (middle child-severely autistic)      No regular exercise   Social Determinants of Health   Financial Resource Strain: Not on file  Food Insecurity: Not on file  Transportation Needs: Not on file  Physical Activity: Not on file  Stress: Not on file  Social Connections: Not on file  Intimate Partner Violence: Not on file    Past Surgical History:  Procedure Laterality Date  . 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 DILATATION & CURETTAGE/HYSTEROSCOPY WITH MYOSURE N/A 10/07/2019   Procedure: DILATATION AND CURETTAGE /HYSTEROSCOPY, Myosure, Polypectomy;  Surgeon: Ambrose Bing, MD;  Location: [facility] OR;  Service: Gynecology;  Laterality: N/A;  . ENDOMETRIAL BIOPSY  07/14/2019      . epidural steroid injection      Family History  Problem Relation Age of Onset  . Rheum arthritis Mother   . COPD Mother   . Stroke Mother   . Depression Mother   . Anxiety disorder Mother   . Eating disorder Mother   . Obesity Mother   . Hypertension Father   . Depression Father   . Alcoholism Father   . Heart disease Maternal Grandmother   . Stroke Maternal Grandmother   . Alcohol abuse Maternal Grandmother   . Alcohol abuse Maternal Grandfather   . Aneurysm Paternal Grandmother   . Autism Son        severe  . Diabetes Paternal Aunt   . Breast cancer Neg Hx     Allergies  Allergen Reactions  .  Diphenhydramine Hcl Rash    minor rash  . Penicillins Rash    Current Outpatient Medications on File Prior to Visit  Medication Sig Dispense Refill  . acetaminophen (TYLENOL) 650 MG CR tablet Take 1,950 mg by mouth every 8 (eight) hours as needed for pain.     Marland Kitchen aspirin EC 81 MG tablet Take 81 mg by mouth daily.    Marland Kitchen atorvastatin (LIPITOR) 10 MG tablet Take 1 tablet (10 mg total) by mouth daily. For cholesterol. 90 tablet 3  . celecoxib (CELEBREX) 100 MG  capsule TAKE 1 CAPSULE (100 MG TOTAL) BY MOUTH 2 (TWO) TIMES DAILY. AS NEEDED FOR PAIN. 180 capsule 1  . cetirizine (ZYRTEC) 10 MG tablet TAKE 1 TABLET BY MOUTH EVERY DAY 90 tablet 0  . citalopram (CELEXA) 40 MG tablet Take 40 mg by mouth daily.    . DULoxetine (CYMBALTA) 30 MG capsule Take 30 mg by mouth daily. Take with 60 mg to equal 90 mg daily    . DULoxetine (CYMBALTA) 60 MG capsule Take 60 mg by mouth daily. Take with 30 mg to equal 90 mg daily    . folic acid (FOLVITE) 1 MG tablet Take 1 mg by mouth daily.    Marland Kitchen gabapentin (NEURONTIN) 300 MG capsule Take 1 capsule (300 mg total) by mouth 3 (three) times daily. For back pain. (Patient taking differently: Take 300 mg by mouth See admin instructions. Take 300 mg 3 times daily, may take a 4th 300 mg dose as needed for pain) 270 capsule 3  . glipiZIDE (GLUCOTROL) 10 MG tablet TAKE 1 TABLET (10 MG TOTAL) BY MOUTH 2 (TWO) TIMES DAILY BEFORE A MEAL. FOR DIABETES. 180 tablet 3  . hydrocortisone 2.5 % cream Apply 1 application topically 2 (two) times daily.     . Insulin Pen Needle (PEN NEEDLES) 31G X 6 MM MISC Use nightly with insulin. 100 each 2  . lamoTRIgine (LAMICTAL) 200 MG tablet Take 200 mg by mouth daily.    Marland Kitchen lamoTRIgine (LAMICTAL) 25 MG tablet Take 50 mg by mouth daily.    Marland Kitchen lisinopril (ZESTRIL) 30 MG tablet TAKE 1 TABLET (30 MG TOTAL) BY MOUTH DAILY. FOR BLOOD PRESSURE 90 tablet 0  . metFORMIN (GLUCOPHAGE-XR) 500 MG 24 hr tablet TAKE 2 TABLETS (1,000 MG TOTAL) BY MOUTH DAILY WITH  BREAKFAST. FOR DIABETES. 180 tablet 1  . metroNIDAZOLE (METROCREAM) 0.75 % cream Apply 1 application topically daily.    . traZODone (DESYREL) 50 MG tablet      No current facility-administered medications on file prior to visit.    BP 124/78 (BP Location: Left Arm, Patient Position: Sitting, Cuff Size: Large)   Pulse (!) 104   Temp (!) 97.5 F (36.4 C) (Temporal)   Ht 5\' 6"  (1.676 m)   Wt (!) 357 lb (161.9 kg)   LMP  (LMP Unknown)   SpO2 98%   BMI 57.62 kg/m    Objective:   Physical Exam Constitutional:      Appearance: She is well-nourished.  Cardiovascular:     Rate and Rhythm: Normal rate and regular rhythm.  Pulmonary:     Effort: Pulmonary effort is normal.     Breath sounds: Normal breath sounds.  Musculoskeletal:     Cervical back: Neck supple.  Skin:    General: Skin is warm and dry.  Psychiatric:        Mood and Affect: Mood and affect and mood normal.            Assessment & Plan:

## 2020-06-01 NOTE — Assessment & Plan Note (Addendum)
Chronic and continued, active in physical therapy. It appears that her physical therapist is really pushing her to increase strength and endurance.  Continue PT, encouraged the gym membership.  Continue gabapentin 300 mg 3 times daily, Celebrex 100 mg once or twice daily, Tylenol as needed

## 2020-06-01 NOTE — Assessment & Plan Note (Signed)
A1c today of 7.0 which is an improvement from her last visit. Commended her on the success! Encouraged her to continue to work on a healthy diet and weight loss.  Continue Metformin XR 1000 mg daily, glipizide 10 mg twice daily. No longer on insulin.  Foot exam today. She is aware that she needs to schedule an eye exam. Managed on statin and ACE Pneumonia vaccine up-to-date.  Follow-up in 3 to 6 months.

## 2020-06-01 NOTE — Assessment & Plan Note (Signed)
Compliant to statin therapy, repeat blood panel pending.  Continue atorvastatin 10 mg daily.

## 2020-06-01 NOTE — Assessment & Plan Note (Signed)
Mood has been improving gradually, she appears well today.  Continue to follow with psychiatry. Continue Lamictal to 50 mg in divided doses, citalopram 40 mg daily, duloxetine 90 mg daily. She hardly uses trazodone, continue as needed use.

## 2020-06-01 NOTE — Assessment & Plan Note (Signed)
Completed ABIs in May 2020, normal. Pedal pulses today 2+ bilaterally.

## 2020-06-01 NOTE — Assessment & Plan Note (Signed)
No longer taking vitamin D, vitamin D level pending.

## 2020-06-01 NOTE — Assessment & Plan Note (Signed)
No longer taking B12, repeat level pending today.

## 2020-06-01 NOTE — Assessment & Plan Note (Signed)
Chronic, stable. Continue regular PT activities, home exercises, weight loss.  Continue gabapentin 3 mg 3 times daily, Celebrex 100 mg once to twice daily, Tylenol as needed.

## 2020-06-01 NOTE — Assessment & Plan Note (Signed)
Appears to be improving with outpatient physical therapy. Continue same.

## 2020-06-02 ENCOUNTER — Ambulatory Visit (INDEPENDENT_AMBULATORY_CARE_PROVIDER_SITE_OTHER): Payer: BC Managed Care – PPO | Admitting: Psychology

## 2020-06-02 DIAGNOSIS — F3289 Other specified depressive episodes: Secondary | ICD-10-CM | POA: Diagnosis not present

## 2020-06-02 DIAGNOSIS — M25512 Pain in left shoulder: Secondary | ICD-10-CM | POA: Diagnosis not present

## 2020-06-02 DIAGNOSIS — M79604 Pain in right leg: Secondary | ICD-10-CM | POA: Diagnosis not present

## 2020-06-02 DIAGNOSIS — M5459 Other low back pain: Secondary | ICD-10-CM | POA: Diagnosis not present

## 2020-06-02 DIAGNOSIS — M79605 Pain in left leg: Secondary | ICD-10-CM | POA: Diagnosis not present

## 2020-06-05 DIAGNOSIS — G8929 Other chronic pain: Secondary | ICD-10-CM

## 2020-06-05 DIAGNOSIS — J302 Other seasonal allergic rhinitis: Secondary | ICD-10-CM

## 2020-06-05 DIAGNOSIS — M2550 Pain in unspecified joint: Secondary | ICD-10-CM

## 2020-06-05 DIAGNOSIS — E559 Vitamin D deficiency, unspecified: Secondary | ICD-10-CM

## 2020-06-06 MED ORDER — CETIRIZINE HCL 10 MG PO TABS: 10.0000 mg | ORAL_TABLET | Freq: Every day | ORAL | 1 refills | Status: DC

## 2020-06-06 MED ORDER — VITAMIN D (ERGOCALCIFEROL) 1.25 MG (50000 UNIT) PO CAPS: ORAL_CAPSULE | ORAL | 1 refills | Status: DC

## 2020-06-06 MED ORDER — CELECOXIB 100 MG PO CAPS: 100.0000 mg | ORAL_CAPSULE | Freq: Two times a day (BID) | ORAL | 1 refills | Status: DC

## 2020-06-07 DIAGNOSIS — M79604 Pain in right leg: Secondary | ICD-10-CM | POA: Diagnosis not present

## 2020-06-07 DIAGNOSIS — M25512 Pain in left shoulder: Secondary | ICD-10-CM | POA: Diagnosis not present

## 2020-06-07 DIAGNOSIS — M79605 Pain in left leg: Secondary | ICD-10-CM | POA: Diagnosis not present

## 2020-06-07 DIAGNOSIS — M5459 Other low back pain: Secondary | ICD-10-CM | POA: Diagnosis not present

## 2020-06-09 ENCOUNTER — Ambulatory Visit (INDEPENDENT_AMBULATORY_CARE_PROVIDER_SITE_OTHER): Payer: BC Managed Care – PPO | Admitting: Psychology

## 2020-06-09 DIAGNOSIS — M79605 Pain in left leg: Secondary | ICD-10-CM | POA: Diagnosis not present

## 2020-06-09 DIAGNOSIS — M79604 Pain in right leg: Secondary | ICD-10-CM | POA: Diagnosis not present

## 2020-06-09 DIAGNOSIS — M5459 Other low back pain: Secondary | ICD-10-CM | POA: Diagnosis not present

## 2020-06-09 DIAGNOSIS — M25512 Pain in left shoulder: Secondary | ICD-10-CM | POA: Diagnosis not present

## 2020-06-09 DIAGNOSIS — F3289 Other specified depressive episodes: Secondary | ICD-10-CM | POA: Diagnosis not present

## 2020-06-16 ENCOUNTER — Ambulatory Visit (INDEPENDENT_AMBULATORY_CARE_PROVIDER_SITE_OTHER): Payer: BC Managed Care – PPO | Admitting: Psychology

## 2020-06-16 DIAGNOSIS — F3289 Other specified depressive episodes: Secondary | ICD-10-CM | POA: Diagnosis not present

## 2020-06-21 DIAGNOSIS — M25512 Pain in left shoulder: Secondary | ICD-10-CM | POA: Diagnosis not present

## 2020-06-21 DIAGNOSIS — M5459 Other low back pain: Secondary | ICD-10-CM | POA: Diagnosis not present

## 2020-06-21 DIAGNOSIS — M79605 Pain in left leg: Secondary | ICD-10-CM | POA: Diagnosis not present

## 2020-06-21 DIAGNOSIS — M79604 Pain in right leg: Secondary | ICD-10-CM | POA: Diagnosis not present

## 2020-06-23 ENCOUNTER — Ambulatory Visit (INDEPENDENT_AMBULATORY_CARE_PROVIDER_SITE_OTHER): Payer: BC Managed Care – PPO | Admitting: Psychology

## 2020-06-23 DIAGNOSIS — M5459 Other low back pain: Secondary | ICD-10-CM | POA: Diagnosis not present

## 2020-06-23 DIAGNOSIS — M79604 Pain in right leg: Secondary | ICD-10-CM | POA: Diagnosis not present

## 2020-06-23 DIAGNOSIS — F3289 Other specified depressive episodes: Secondary | ICD-10-CM

## 2020-06-23 DIAGNOSIS — M79605 Pain in left leg: Secondary | ICD-10-CM | POA: Diagnosis not present

## 2020-06-23 DIAGNOSIS — M25512 Pain in left shoulder: Secondary | ICD-10-CM | POA: Diagnosis not present

## 2020-06-28 DIAGNOSIS — M5459 Other low back pain: Secondary | ICD-10-CM | POA: Diagnosis not present

## 2020-06-28 DIAGNOSIS — M79605 Pain in left leg: Secondary | ICD-10-CM | POA: Diagnosis not present

## 2020-06-28 DIAGNOSIS — M79604 Pain in right leg: Secondary | ICD-10-CM | POA: Diagnosis not present

## 2020-06-28 DIAGNOSIS — M25512 Pain in left shoulder: Secondary | ICD-10-CM | POA: Diagnosis not present

## 2020-06-30 ENCOUNTER — Ambulatory Visit (INDEPENDENT_AMBULATORY_CARE_PROVIDER_SITE_OTHER): Payer: BC Managed Care – PPO | Admitting: Psychology

## 2020-06-30 DIAGNOSIS — M25512 Pain in left shoulder: Secondary | ICD-10-CM | POA: Diagnosis not present

## 2020-06-30 DIAGNOSIS — F3289 Other specified depressive episodes: Secondary | ICD-10-CM

## 2020-06-30 DIAGNOSIS — M79604 Pain in right leg: Secondary | ICD-10-CM | POA: Diagnosis not present

## 2020-06-30 DIAGNOSIS — M5459 Other low back pain: Secondary | ICD-10-CM | POA: Diagnosis not present

## 2020-06-30 DIAGNOSIS — M79605 Pain in left leg: Secondary | ICD-10-CM | POA: Diagnosis not present

## 2020-07-05 DIAGNOSIS — M25512 Pain in left shoulder: Secondary | ICD-10-CM | POA: Diagnosis not present

## 2020-07-05 DIAGNOSIS — M5459 Other low back pain: Secondary | ICD-10-CM | POA: Diagnosis not present

## 2020-07-05 DIAGNOSIS — M79604 Pain in right leg: Secondary | ICD-10-CM | POA: Diagnosis not present

## 2020-07-05 DIAGNOSIS — M79605 Pain in left leg: Secondary | ICD-10-CM | POA: Diagnosis not present

## 2020-07-06 DIAGNOSIS — Z1211 Encounter for screening for malignant neoplasm of colon: Secondary | ICD-10-CM | POA: Diagnosis not present

## 2020-07-06 LAB — COLOGUARD: Cologuard: NEGATIVE

## 2020-07-07 ENCOUNTER — Ambulatory Visit (INDEPENDENT_AMBULATORY_CARE_PROVIDER_SITE_OTHER): Payer: BC Managed Care – PPO | Admitting: Psychology

## 2020-07-07 DIAGNOSIS — F3289 Other specified depressive episodes: Secondary | ICD-10-CM | POA: Diagnosis not present

## 2020-07-07 DIAGNOSIS — M79605 Pain in left leg: Secondary | ICD-10-CM | POA: Diagnosis not present

## 2020-07-07 DIAGNOSIS — M79604 Pain in right leg: Secondary | ICD-10-CM | POA: Diagnosis not present

## 2020-07-07 DIAGNOSIS — M5459 Other low back pain: Secondary | ICD-10-CM | POA: Diagnosis not present

## 2020-07-07 DIAGNOSIS — M25512 Pain in left shoulder: Secondary | ICD-10-CM | POA: Diagnosis not present

## 2020-07-12 DIAGNOSIS — M25512 Pain in left shoulder: Secondary | ICD-10-CM | POA: Diagnosis not present

## 2020-07-12 DIAGNOSIS — M79605 Pain in left leg: Secondary | ICD-10-CM | POA: Diagnosis not present

## 2020-07-12 DIAGNOSIS — M5459 Other low back pain: Secondary | ICD-10-CM | POA: Diagnosis not present

## 2020-07-12 DIAGNOSIS — M79604 Pain in right leg: Secondary | ICD-10-CM | POA: Diagnosis not present

## 2020-07-14 ENCOUNTER — Other Ambulatory Visit: Payer: Self-pay | Admitting: Primary Care

## 2020-07-14 ENCOUNTER — Ambulatory Visit (INDEPENDENT_AMBULATORY_CARE_PROVIDER_SITE_OTHER): Payer: BC Managed Care – PPO | Admitting: Psychology

## 2020-07-14 DIAGNOSIS — I10 Essential (primary) hypertension: Secondary | ICD-10-CM

## 2020-07-14 DIAGNOSIS — F3289 Other specified depressive episodes: Secondary | ICD-10-CM | POA: Diagnosis not present

## 2020-07-14 DIAGNOSIS — M5459 Other low back pain: Secondary | ICD-10-CM | POA: Diagnosis not present

## 2020-07-14 DIAGNOSIS — M79604 Pain in right leg: Secondary | ICD-10-CM | POA: Diagnosis not present

## 2020-07-14 DIAGNOSIS — M79605 Pain in left leg: Secondary | ICD-10-CM | POA: Diagnosis not present

## 2020-07-14 DIAGNOSIS — M25512 Pain in left shoulder: Secondary | ICD-10-CM | POA: Diagnosis not present

## 2020-07-14 LAB — COLOGUARD: COLOGUARD: NEGATIVE

## 2020-07-14 LAB — EXTERNAL GENERIC LAB PROCEDURE: COLOGUARD: NEGATIVE

## 2020-07-19 DIAGNOSIS — M25512 Pain in left shoulder: Secondary | ICD-10-CM | POA: Diagnosis not present

## 2020-07-19 DIAGNOSIS — M79605 Pain in left leg: Secondary | ICD-10-CM | POA: Diagnosis not present

## 2020-07-19 DIAGNOSIS — M79604 Pain in right leg: Secondary | ICD-10-CM | POA: Diagnosis not present

## 2020-07-19 DIAGNOSIS — M5459 Other low back pain: Secondary | ICD-10-CM | POA: Diagnosis not present

## 2020-07-21 ENCOUNTER — Ambulatory Visit (INDEPENDENT_AMBULATORY_CARE_PROVIDER_SITE_OTHER): Payer: BC Managed Care – PPO | Admitting: Psychology

## 2020-07-21 DIAGNOSIS — F3289 Other specified depressive episodes: Secondary | ICD-10-CM | POA: Diagnosis not present

## 2020-07-25 ENCOUNTER — Encounter: Payer: Self-pay | Admitting: Primary Care

## 2020-07-28 ENCOUNTER — Ambulatory Visit (INDEPENDENT_AMBULATORY_CARE_PROVIDER_SITE_OTHER): Payer: BC Managed Care – PPO | Admitting: Psychology

## 2020-07-28 ENCOUNTER — Encounter: Payer: Self-pay | Admitting: Primary Care

## 2020-07-28 DIAGNOSIS — F3289 Other specified depressive episodes: Secondary | ICD-10-CM

## 2020-07-29 DIAGNOSIS — M5459 Other low back pain: Secondary | ICD-10-CM | POA: Diagnosis not present

## 2020-07-29 DIAGNOSIS — M79604 Pain in right leg: Secondary | ICD-10-CM | POA: Diagnosis not present

## 2020-07-29 DIAGNOSIS — M25512 Pain in left shoulder: Secondary | ICD-10-CM | POA: Diagnosis not present

## 2020-07-29 DIAGNOSIS — M79605 Pain in left leg: Secondary | ICD-10-CM | POA: Diagnosis not present

## 2020-08-02 DIAGNOSIS — M25512 Pain in left shoulder: Secondary | ICD-10-CM | POA: Diagnosis not present

## 2020-08-02 DIAGNOSIS — M79604 Pain in right leg: Secondary | ICD-10-CM | POA: Diagnosis not present

## 2020-08-02 DIAGNOSIS — M5459 Other low back pain: Secondary | ICD-10-CM | POA: Diagnosis not present

## 2020-08-02 DIAGNOSIS — M79605 Pain in left leg: Secondary | ICD-10-CM | POA: Diagnosis not present

## 2020-08-03 DIAGNOSIS — F39 Unspecified mood [affective] disorder: Secondary | ICD-10-CM | POA: Diagnosis not present

## 2020-08-03 DIAGNOSIS — F5105 Insomnia due to other mental disorder: Secondary | ICD-10-CM | POA: Diagnosis not present

## 2020-08-03 DIAGNOSIS — F411 Generalized anxiety disorder: Secondary | ICD-10-CM | POA: Diagnosis not present

## 2020-08-04 ENCOUNTER — Ambulatory Visit (INDEPENDENT_AMBULATORY_CARE_PROVIDER_SITE_OTHER): Payer: BC Managed Care – PPO | Admitting: Psychology

## 2020-08-04 DIAGNOSIS — M79604 Pain in right leg: Secondary | ICD-10-CM | POA: Diagnosis not present

## 2020-08-04 DIAGNOSIS — F3289 Other specified depressive episodes: Secondary | ICD-10-CM

## 2020-08-04 DIAGNOSIS — M25512 Pain in left shoulder: Secondary | ICD-10-CM | POA: Diagnosis not present

## 2020-08-04 DIAGNOSIS — M79605 Pain in left leg: Secondary | ICD-10-CM | POA: Diagnosis not present

## 2020-08-04 DIAGNOSIS — M5459 Other low back pain: Secondary | ICD-10-CM | POA: Diagnosis not present

## 2020-08-10 DIAGNOSIS — M79605 Pain in left leg: Secondary | ICD-10-CM | POA: Diagnosis not present

## 2020-08-10 DIAGNOSIS — M79604 Pain in right leg: Secondary | ICD-10-CM | POA: Diagnosis not present

## 2020-08-10 DIAGNOSIS — M5459 Other low back pain: Secondary | ICD-10-CM | POA: Diagnosis not present

## 2020-08-10 DIAGNOSIS — M25512 Pain in left shoulder: Secondary | ICD-10-CM | POA: Diagnosis not present

## 2020-08-11 ENCOUNTER — Ambulatory Visit (INDEPENDENT_AMBULATORY_CARE_PROVIDER_SITE_OTHER): Payer: BC Managed Care – PPO | Admitting: Psychology

## 2020-08-11 DIAGNOSIS — F3289 Other specified depressive episodes: Secondary | ICD-10-CM

## 2020-08-18 ENCOUNTER — Ambulatory Visit (INDEPENDENT_AMBULATORY_CARE_PROVIDER_SITE_OTHER): Payer: BC Managed Care – PPO | Admitting: Psychology

## 2020-08-18 DIAGNOSIS — F3289 Other specified depressive episodes: Secondary | ICD-10-CM

## 2020-08-25 ENCOUNTER — Ambulatory Visit: Payer: BC Managed Care – PPO | Admitting: Psychology

## 2020-09-01 ENCOUNTER — Ambulatory Visit (INDEPENDENT_AMBULATORY_CARE_PROVIDER_SITE_OTHER): Payer: BC Managed Care – PPO | Admitting: Psychology

## 2020-09-01 DIAGNOSIS — F3289 Other specified depressive episodes: Secondary | ICD-10-CM | POA: Diagnosis not present

## 2020-09-08 ENCOUNTER — Ambulatory Visit (INDEPENDENT_AMBULATORY_CARE_PROVIDER_SITE_OTHER): Payer: BC Managed Care – PPO | Admitting: Psychology

## 2020-09-08 DIAGNOSIS — F3289 Other specified depressive episodes: Secondary | ICD-10-CM

## 2020-09-15 ENCOUNTER — Ambulatory Visit (INDEPENDENT_AMBULATORY_CARE_PROVIDER_SITE_OTHER): Payer: BC Managed Care – PPO | Admitting: Psychology

## 2020-09-15 DIAGNOSIS — F3289 Other specified depressive episodes: Secondary | ICD-10-CM | POA: Diagnosis not present

## 2020-09-22 ENCOUNTER — Ambulatory Visit: Payer: BC Managed Care – PPO | Admitting: Psychology

## 2020-09-29 ENCOUNTER — Ambulatory Visit (INDEPENDENT_AMBULATORY_CARE_PROVIDER_SITE_OTHER): Payer: BC Managed Care – PPO | Admitting: Psychology

## 2020-09-29 DIAGNOSIS — F3289 Other specified depressive episodes: Secondary | ICD-10-CM | POA: Diagnosis not present

## 2020-10-04 ENCOUNTER — Other Ambulatory Visit: Payer: Self-pay

## 2020-10-04 ENCOUNTER — Ambulatory Visit (INDEPENDENT_AMBULATORY_CARE_PROVIDER_SITE_OTHER): Payer: BC Managed Care – PPO | Admitting: Primary Care

## 2020-10-04 ENCOUNTER — Encounter: Payer: Self-pay | Admitting: Primary Care

## 2020-10-04 VITALS — BP 136/82 | HR 122 | Temp 98.6°F | Ht 66.0 in | Wt 366.0 lb

## 2020-10-04 DIAGNOSIS — E538 Deficiency of other specified B group vitamins: Secondary | ICD-10-CM

## 2020-10-04 DIAGNOSIS — E1169 Type 2 diabetes mellitus with other specified complication: Secondary | ICD-10-CM | POA: Diagnosis not present

## 2020-10-04 DIAGNOSIS — R55 Syncope and collapse: Secondary | ICD-10-CM

## 2020-10-04 DIAGNOSIS — I10 Essential (primary) hypertension: Secondary | ICD-10-CM

## 2020-10-04 DIAGNOSIS — E559 Vitamin D deficiency, unspecified: Secondary | ICD-10-CM

## 2020-10-04 LAB — CBC
HCT: 38.1 % (ref 36.0–46.0)
Hemoglobin: 12.8 g/dL (ref 12.0–15.0)
MCHC: 33.6 g/dL (ref 30.0–36.0)
MCV: 89.8 fl (ref 78.0–100.0)
Platelets: 333 10*3/uL (ref 150.0–400.0)
RBC: 4.24 Mil/uL (ref 3.87–5.11)
RDW: 13.8 % (ref 11.5–15.5)
WBC: 10.1 10*3/uL (ref 4.0–10.5)

## 2020-10-04 LAB — VITAMIN B12: Vitamin B-12: 550 pg/mL (ref 211–911)

## 2020-10-04 LAB — TSH: TSH: 2.51 u[IU]/mL (ref 0.35–4.50)

## 2020-10-04 LAB — HEMOGLOBIN A1C: Hgb A1c MFr Bld: 8.6 % — ABNORMAL HIGH (ref 4.6–6.5)

## 2020-10-04 LAB — VITAMIN D 25 HYDROXY (VIT D DEFICIENCY, FRACTURES): VITD: 33 ng/mL (ref 30.00–100.00)

## 2020-10-04 NOTE — Assessment & Plan Note (Signed)
Compliant to vitamin D 50,000 units once weekly and an additional supplementation of D.  Repeat vitamin D level pending.

## 2020-10-04 NOTE — Assessment & Plan Note (Signed)
Stable in the office today, continue lisinopril 30 mg.

## 2020-10-04 NOTE — Patient Instructions (Addendum)
Stop by the lab prior to leaving today. I will notify you of your results once received.   You will be contacted regarding your referral to cardiology.  Please let us know if you have not been contacted within two weeks.   Be sure to increase water intake for a goal of 4-5 bottles daily.   It was a pleasure to see you today!

## 2020-10-04 NOTE — Progress Notes (Signed)
Subjective:    Patient ID: Brandy [name], female    DOB: [birth year], 55 y.o.   MRN: [id number]  HPI  Brandy [name] is a very pleasant 55 y.o. female with a history of type 2 diabetes, CHF, Sleep apnea, MDD, vitamin B12 deficiency, vitamin D deficiency, PAD, hypertension who presents today for follow up and repeat labs. She would also like to discuss near syncope.   She is compliant to vitamin D 50,000 once weekly and then an additional vitamin D supplementation for which she cannot recall.   She is not consistent with taking B12 vitamins.   She's been considering weight loss surgery, is looking through [facility]. It was recommended to her years ago by a Careers adviser through [facility]. She plans to pursue a consultation.   She's had two incidences in the shower for which she'll experience weakness, nausea, and feel like she may pass out or vomit. She always sits while showering, and both episodes have occurred while showering. During her first episode she felt so weak that she had to have her husband assist her out of the shower so that she could lay down for about 30 minutes. The second episode wasn't as bad, but had to be helped out of the shower into the bed.  Each episode lasted for about 30 minutes.  Her first episode occurred two months ago, and the second episode occurred one month ago. She typically showers her autistic son first, then will shower herself. Bathing her son tires her out, she does not believe she was bathing her son prior to her second episode. She will typically shower once every 1-2 weeks. She doesn't hydrate consistently with water. She saw cardiology in 2020, negative testing. She denies new medications or changes in her regimen.   Review of Systems  Respiratory: Negative for shortness of breath.   Cardiovascular: Negative for chest pain.  Neurological: Positive for weakness and light-headedness.       See HPI          Past Medical History:  Diagnosis  Date  . Anxiety   . Arthritis   . Back pain   . Brain fog   . CHF (congestive heart failure) (HCC)    pt not aware of this  . Depression   . Diabetes mellitus without complication (HCC)   . Difficulty walking   . Dysmetabolic syndrome X   . Dyspnea   . Fatigue   . Fibromyalgia   . Hypertension   . Hypertriglyceridemia   . Joint pain   . Leg weakness   . Lower extremity edema   . Muscle atrophy   . Neuropathy   . Obesity   . Osteoarthritis   . PONV (postoperative nausea and vomiting)   . Poor memory   . Post-menopausal bleeding   . Prediabetes   . Rosacea   . Sleep apnea    not on cpap yet  . Tetanus vaccine causing adverse effect in therapeutic use   . Vitamin D deficiency     Social History   Socioeconomic History  . Marital status: Married    Spouse name: Ed  . Number of children: 3  . Years of education: Not on file  . Highest education level: Not on file  Occupational History  . Occupation: Immunologist: [employer]  Tobacco Use  . Smoking status: Never Smoker  . Smokeless tobacco: Never Used  Vaping Use  . Vaping Use:  Never used  Substance and Sexual Activity  . Alcohol use: Yes    Comment: rarely - once a year  . Drug use: No  . Sexual activity: Not on file  Other Topics Concern  . Not on file  Social History Narrative   Best boy      Married; 3 kids (middle child-severely autistic)      No regular exercise   Social Determinants of Health   Financial Resource Strain: Not on file  Food Insecurity: Not on file  Transportation Needs: Not on file  Physical Activity: Not on file  Stress: Not on file  Social Connections: Not on file  Intimate Partner Violence: Not on file    Past Surgical History:  Procedure Laterality Date  . 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 DILATATION & CURETTAGE/HYSTEROSCOPY WITH MYOSURE N/A 10/07/2019   Procedure: DILATATION AND CURETTAGE /HYSTEROSCOPY, Myosure,  Polypectomy;  Surgeon: Oto Bing, MD;  Location: [facility] OR;  Service: Gynecology;  Laterality: N/A;  . ENDOMETRIAL BIOPSY  07/14/2019      . epidural steroid injection      Family History  Problem Relation Age of Onset  . Rheum arthritis Mother   . COPD Mother   . Stroke Mother   . Depression Mother   . Anxiety disorder Mother   . Eating disorder Mother   . Obesity Mother   . Hypertension Father   . Depression Father   . Alcoholism Father   . Heart disease Maternal Grandmother   . Stroke Maternal Grandmother   . Alcohol abuse Maternal Grandmother   . Alcohol abuse Maternal Grandfather   . Aneurysm Paternal Grandmother   . Autism Son        severe  . Diabetes Paternal Aunt   . Breast cancer Neg Hx     Allergies  Allergen Reactions  . Diphenhydramine Hcl Rash    minor rash  . Penicillins Rash    Current Outpatient Medications on File Prior to Visit  Medication Sig Dispense Refill  . acetaminophen (TYLENOL) 650 MG CR tablet Take 1,950 mg by mouth every 8 (eight) hours as needed for pain.     Marland Kitchen aspirin EC 81 MG tablet Take 81 mg by mouth daily.    Marland Kitchen atorvastatin (LIPITOR) 10 MG tablet Take 1 tablet (10 mg total) by mouth daily. For cholesterol. 90 tablet 3  . celecoxib (CELEBREX) 100 MG capsule Take 1 capsule (100 mg total) by mouth 2 (two) times daily. As needed for pain. 180 capsule 1  . cetirizine (ZYRTEC) 10 MG tablet Take 1 tablet (10 mg total) by mouth daily. For allergies 90 tablet 1  . citalopram (CELEXA) 40 MG tablet Take 40 mg by mouth daily.    . DULoxetine (CYMBALTA) 30 MG capsule Take 30 mg by mouth daily. Take with 60 mg to equal 90 mg daily    . DULoxetine (CYMBALTA) 60 MG capsule Take 60 mg by mouth daily. Take with 30 mg to equal 90 mg daily    . folic acid (FOLVITE) 1 MG tablet Take 1 mg by mouth daily.    Marland Kitchen gabapentin (NEURONTIN) 300 MG capsule Take 1 capsule (300 mg total) by mouth 3 (three) times daily. For back pain. (Patient taking differently: Take  300 mg by mouth See admin instructions. Take 300 mg 3 times daily, may take a 4th 300 mg dose as needed for pain) 270 capsule 3  . glipiZIDE (GLUCOTROL) 10 MG tablet TAKE 1 TABLET (  10 MG TOTAL) BY MOUTH 2 (TWO) TIMES DAILY BEFORE A MEAL. FOR DIABETES. 180 tablet 3  . hydrocortisone 2.5 % cream Apply 1 application topically 2 (two) times daily.     . Insulin Pen Needle (PEN NEEDLES) 31G X 6 MM MISC Use nightly with insulin. 100 each 2  . lamoTRIgine (LAMICTAL) 200 MG tablet Take 200 mg by mouth daily.    Marland Kitchen lamoTRIgine (LAMICTAL) 25 MG tablet Take 100 mg by mouth daily.    Marland Kitchen lisinopril (ZESTRIL) 30 MG tablet TAKE 1 TABLET (30 MG TOTAL) BY MOUTH DAILY. FOR BLOOD PRESSURE 90 tablet 3  . metFORMIN (GLUCOPHAGE-XR) 500 MG 24 hr tablet TAKE 2 TABLETS (1,000 MG TOTAL) BY MOUTH DAILY WITH BREAKFAST. FOR DIABETES. 180 tablet 1  . metroNIDAZOLE (METROCREAM) 0.75 % cream Apply 1 application topically daily.    . traZODone (DESYREL) 50 MG tablet     . Vitamin D, Ergocalciferol, (DRISDOL) 1.25 MG (50000 UNIT) CAPS capsule Take 1 capsule by mouth once weekly for vitamin D. 12 capsule 1   No current facility-administered medications on file prior to visit.    BP 136/82   Pulse (!) 122   Temp 98.6 F (37 C) (Temporal)   Ht 5\' 6"  (1.676 m)   Wt (!) 366 lb (166 kg)   LMP  (LMP Unknown)   SpO2 97%   BMI 59.07 kg/m  Objective:   Physical Exam Cardiovascular:     Rate and Rhythm: Normal rate and regular rhythm.  Pulmonary:     Effort: Pulmonary effort is normal.     Breath sounds: Normal breath sounds.  Musculoskeletal:     Cervical back: Neck supple.  Skin:    General: Skin is warm and dry.           Assessment & Plan:      This visit occurred during the SARS-CoV-2 public health emergency.  Safety protocols were in place, including screening questions prior to the visit, additional usage of staff PPE, and extensive cleaning of exam room while observing appropriate contact time as indicated  for disinfecting solutions.

## 2020-10-04 NOTE — Assessment & Plan Note (Signed)
Inconsistent use of vitamin B12 tablets, repeat vitamin B12 level pending.

## 2020-10-04 NOTE — Assessment & Plan Note (Signed)
Repeat A1c pending. Continue glipizide 10 mg twice daily and metformin XR 1000 mg daily.

## 2020-10-04 NOTE — Assessment & Plan Note (Signed)
2 episodes occurring within the last 2 months, new symptoms.  Unclear etiology, could be dehydration versus cardiac cause.  Checking labs today including TSH, CBC, A1c, BMP.  Will refer patient back to cardiology as it has been 2 years since she was evaluated.  She does appear euvolemic.

## 2020-10-06 ENCOUNTER — Ambulatory Visit: Payer: BC Managed Care – PPO | Admitting: Psychology

## 2020-10-06 DIAGNOSIS — F39 Unspecified mood [affective] disorder: Secondary | ICD-10-CM | POA: Diagnosis not present

## 2020-10-06 DIAGNOSIS — F5105 Insomnia due to other mental disorder: Secondary | ICD-10-CM | POA: Diagnosis not present

## 2020-10-06 DIAGNOSIS — F411 Generalized anxiety disorder: Secondary | ICD-10-CM | POA: Diagnosis not present

## 2020-10-10 ENCOUNTER — Other Ambulatory Visit: Payer: Self-pay | Admitting: Primary Care

## 2020-10-10 DIAGNOSIS — E119 Type 2 diabetes mellitus without complications: Secondary | ICD-10-CM

## 2020-10-13 ENCOUNTER — Ambulatory Visit (INDEPENDENT_AMBULATORY_CARE_PROVIDER_SITE_OTHER): Payer: BC Managed Care – PPO | Admitting: Psychology

## 2020-10-13 DIAGNOSIS — F3289 Other specified depressive episodes: Secondary | ICD-10-CM | POA: Diagnosis not present

## 2020-10-18 ENCOUNTER — Other Ambulatory Visit: Payer: Self-pay

## 2020-10-20 ENCOUNTER — Ambulatory Visit (INDEPENDENT_AMBULATORY_CARE_PROVIDER_SITE_OTHER): Payer: BC Managed Care – PPO | Admitting: Psychology

## 2020-10-20 DIAGNOSIS — F3289 Other specified depressive episodes: Secondary | ICD-10-CM | POA: Diagnosis not present

## 2020-10-26 ENCOUNTER — Other Ambulatory Visit: Payer: Self-pay | Admitting: Primary Care

## 2020-10-26 DIAGNOSIS — M2550 Pain in unspecified joint: Secondary | ICD-10-CM

## 2020-10-26 DIAGNOSIS — G8929 Other chronic pain: Secondary | ICD-10-CM

## 2020-10-27 ENCOUNTER — Ambulatory Visit (INDEPENDENT_AMBULATORY_CARE_PROVIDER_SITE_OTHER): Payer: BC Managed Care – PPO | Admitting: Psychology

## 2020-10-27 DIAGNOSIS — F329 Major depressive disorder, single episode, unspecified: Secondary | ICD-10-CM

## 2020-10-30 NOTE — Progress Notes (Deleted)
Cardiology Office Note    Date:  10/30/2020   ID:  Brandy [name], DOB [birth year], MRN [id number]  PCP:  Doreene Nest, NP  Cardiologist:  Julien Nordmann, MD  Electrophysiologist:  None   Chief Complaint: Follow up  History of Present Illness:   Brandy [name] is a 55 y.o. female with history of DM2, HTN, hypertriglyceridemia, venous insufficiency confirmed by ultrasound, anxiety, morbid obesity who presents for follow up of ***  She has been followed by cardiology for tachycardia and hypertension, last being seen in 03/2019. At that time, she was noted to have a sedentary lifestyle. She has been out of work since 2019. She reported daytime somnolence, with reported prior sleep study showing no evidence of sleep apnea. She has seen psychiatry for sleep. BP was reasonably controlled.   Prior ABIs normal bilaterally.   ***   Labs independently reviewed: 09/2020 - HGB 12.8, PLT 333, TSH normal, A1c 8.6 05/2020 - potassium 5.1, BUN 24, SCr 0.91, albumin 4.1, AST/ALT normal, TC 137, TG 152, HDL 57, LDL 49 07/2019 - TSH normal  Past Medical History:  Diagnosis Date   Anxiety    Arthritis    Back pain    Brain fog    CHF (congestive heart failure) (HCC)    pt not aware of this   Depression    Diabetes mellitus without complication (HCC)    Difficulty walking    Dysmetabolic syndrome X    Dyspnea    Fatigue    Fibromyalgia    Hypertension    Hypertriglyceridemia    Joint pain    Leg weakness    Lower extremity edema    Muscle atrophy    Neuropathy    Obesity    Osteoarthritis    PONV (postoperative nausea and vomiting)    Poor memory    Post-menopausal bleeding    Prediabetes    Rosacea    Sleep apnea    not on cpap yet   Tetanus vaccine causing adverse effect in therapeutic use    Vitamin D deficiency     Past Surgical History:  Procedure Laterality Date   BONE TUMOR EXCISION  1975   Right leg   DILATATION & CURETTAGE/HYSTEROSCOPY WITH MYOSURE N/A  10/07/2019   Procedure: DILATATION AND CURETTAGE /HYSTEROSCOPY, Myosure, Polypectomy;  Surgeon: [place] Bing, MD;  Location: [facility] OR;  Service: Gynecology;  Laterality: N/A;   ENDOMETRIAL BIOPSY  07/14/2019       epidural steroid injection      Current Medications: No outpatient medications have been marked as taking for the 11/01/20 encounter (Appointment) with Sondra Barges, PA-C.    Allergies:   Diphenhydramine hcl and Penicillins   Social History   Socioeconomic History   Marital status: Married    Spouse name: Ed   Number of children: 3   Years of education: Not on file   Highest education level: Not on file  Occupational History   Occupation: Investment banker, corporate Retirement    Employer: [employer]  Tobacco Use   Smoking status: Never   Smokeless tobacco: Never  Vaping Use   Vaping Use: Never used  Substance and Sexual Activity   Alcohol use: Yes    Comment: rarely - once a year   Drug use: No   Sexual activity: Not on file  Other Topics Concern   Not on file  Social History Narrative   Best boy      Married; 3 kids (middle  child-severely autistic)      No regular exercise   Social Determinants of Health   Financial Resource Strain: Not on file  Food Insecurity: Not on file  Transportation Needs: Not on file  Physical Activity: Not on file  Stress: Not on file  Social Connections: Not on file     Family History:  The patient's family history includes Alcohol abuse in her maternal grandfather and maternal grandmother; Alcoholism in her father; Aneurysm in her paternal grandmother; Anxiety disorder in her mother; Autism in her son; COPD in her mother; Depression in her father and mother; Diabetes in her paternal aunt; Eating disorder in her mother; Heart disease in her maternal grandmother; Hypertension in her father; Obesity in her mother; Rheum arthritis in her mother; Stroke in her maternal grandmother and mother. There is no history of  Breast cancer.  ROS:   ROS   EKGs/Labs/Other Studies Reviewed:    Studies reviewed were summarized above. The additional studies were reviewed today: N/A.  EKG:  EKG is ordered today.  The EKG ordered today demonstrates ***  Recent Labs: 06/01/2020: ALT 9; BUN 24; Creatinine, Ser 0.91; Potassium 5.1; Sodium 135 10/04/2020: Hemoglobin 12.8; Platelets 333.0; TSH 2.51  Recent Lipid Panel    Component Value Date/Time   CHOL 137 06/01/2020 1229   CHOL 162 08/25/2019 1337   TRIG 152.0 (H) 06/01/2020 1229   HDL 57.30 06/01/2020 1229   HDL 69 08/25/2019 1337   CHOLHDL 2 06/01/2020 1229   VLDL 30.4 06/01/2020 1229   LDLCALC 49 06/01/2020 1229   LDLCALC 73 08/25/2019 1337   LDLDIRECT 108.3 09/14/2010 1643    PHYSICAL EXAM:    VS:  LMP  (LMP Unknown)   BMI: There is no height or weight on file to calculate BMI.  Physical Exam  Wt Readings from Last 3 Encounters:  10/04/20 (!) 366 lb (166 kg)  06/01/20 (!) 357 lb (161.9 kg)  03/14/20 (!) 352 lb (159.7 kg)     ASSESSMENT & PLAN:   HTN: Blood pressure ***  HLD LDL 49 with a triglyceride of 152 in 05/2020. On atorvastatin, managed by PCP.   Morbid obesity:  Venous insufficiency:   DM2: A1c 8.6.  Per PCP.   Disposition: F/u with Dr. Mariah Milling or an APP in ***.   Medication Adjustments/Labs and Tests Ordered: Current medicines are reviewed at length with the patient today.  Concerns regarding medicines are outlined above. Medication changes, Labs and Tests ordered today are summarized above and listed in the Patient Instructions accessible in Encounters.   Signed, Eula Listen, PA-C 10/30/2020 4:04 PM     [facility] [place] [address] [place], [place] [address] [phone]

## 2020-11-01 ENCOUNTER — Ambulatory Visit: Payer: BC Managed Care – PPO | Admitting: Physician Assistant

## 2020-11-03 ENCOUNTER — Ambulatory Visit (INDEPENDENT_AMBULATORY_CARE_PROVIDER_SITE_OTHER): Payer: BC Managed Care – PPO | Admitting: Psychology

## 2020-11-03 DIAGNOSIS — F3289 Other specified depressive episodes: Secondary | ICD-10-CM | POA: Diagnosis not present

## 2020-11-10 ENCOUNTER — Ambulatory Visit (INDEPENDENT_AMBULATORY_CARE_PROVIDER_SITE_OTHER): Payer: BC Managed Care – PPO | Admitting: Psychology

## 2020-11-10 DIAGNOSIS — F3289 Other specified depressive episodes: Secondary | ICD-10-CM

## 2020-11-15 LAB — HM DIABETES EYE EXAM

## 2020-11-16 ENCOUNTER — Encounter: Payer: Self-pay | Admitting: Primary Care

## 2020-11-17 ENCOUNTER — Ambulatory Visit: Payer: BC Managed Care – PPO | Admitting: Psychology

## 2020-11-24 ENCOUNTER — Telehealth: Payer: Self-pay | Admitting: Primary Care

## 2020-11-24 ENCOUNTER — Ambulatory Visit (INDEPENDENT_AMBULATORY_CARE_PROVIDER_SITE_OTHER): Payer: BC Managed Care – PPO | Admitting: Psychology

## 2020-11-24 DIAGNOSIS — F3289 Other specified depressive episodes: Secondary | ICD-10-CM

## 2020-11-24 DIAGNOSIS — F5105 Insomnia due to other mental disorder: Secondary | ICD-10-CM | POA: Diagnosis not present

## 2020-11-24 DIAGNOSIS — F411 Generalized anxiety disorder: Secondary | ICD-10-CM | POA: Diagnosis not present

## 2020-11-24 DIAGNOSIS — F39 Unspecified mood [affective] disorder: Secondary | ICD-10-CM | POA: Diagnosis not present

## 2020-11-24 NOTE — Telephone Encounter (Signed)
Pt dropped off letter to request letter of medical necessity from  [place] place in in box

## 2020-11-28 NOTE — Telephone Encounter (Signed)
Form received and faxed to number on ppw

## 2020-12-01 ENCOUNTER — Ambulatory Visit (INDEPENDENT_AMBULATORY_CARE_PROVIDER_SITE_OTHER): Payer: BC Managed Care – PPO | Admitting: Psychology

## 2020-12-01 DIAGNOSIS — F3289 Other specified depressive episodes: Secondary | ICD-10-CM

## 2020-12-08 ENCOUNTER — Ambulatory Visit: Payer: BC Managed Care – PPO | Admitting: Psychology

## 2020-12-15 ENCOUNTER — Ambulatory Visit: Payer: BC Managed Care – PPO | Admitting: Psychology

## 2020-12-21 DIAGNOSIS — G4733 Obstructive sleep apnea (adult) (pediatric): Secondary | ICD-10-CM | POA: Diagnosis not present

## 2020-12-21 DIAGNOSIS — I10 Essential (primary) hypertension: Secondary | ICD-10-CM | POA: Diagnosis not present

## 2020-12-21 DIAGNOSIS — E119 Type 2 diabetes mellitus without complications: Secondary | ICD-10-CM | POA: Diagnosis not present

## 2020-12-22 ENCOUNTER — Other Ambulatory Visit (HOSPITAL_BASED_OUTPATIENT_CLINIC_OR_DEPARTMENT_OTHER): Payer: Self-pay | Admitting: General Surgery

## 2020-12-22 ENCOUNTER — Other Ambulatory Visit: Payer: Self-pay | Admitting: General Surgery

## 2020-12-22 ENCOUNTER — Ambulatory Visit (INDEPENDENT_AMBULATORY_CARE_PROVIDER_SITE_OTHER): Payer: BC Managed Care – PPO | Admitting: Psychology

## 2020-12-22 ENCOUNTER — Telehealth: Payer: Self-pay | Admitting: *Deleted

## 2020-12-22 DIAGNOSIS — F3289 Other specified depressive episodes: Secondary | ICD-10-CM

## 2020-12-22 DIAGNOSIS — R109 Unspecified abdominal pain: Secondary | ICD-10-CM

## 2020-12-22 NOTE — Telephone Encounter (Signed)
Pt has appt 01/05/21 for pre op clearance. I will forward notes to provider for upcoming appt. Will send FYI to surgeon's office pt has appt 01/05/21.

## 2020-12-22 NOTE — Telephone Encounter (Signed)
   Name: Brandy [name]  DOB: 10/20/1965  MRN: [id number]  Primary Cardiologist: Julien Nordmann, MD  Pre-op covering staff: - Please add "pre-op clearance" to the appointment with Cadence Fransico Michael, PA so provider is aware.  If applicable, this message will also be routed to pharmacy pool and/or primary cardiologist for input on holding anticoagulant/antiplatelet agent as requested below so that this information is available to the clearing provider at time of patient's appointment.   [name], NP  12/22/2020, 9:36 AM

## 2020-12-22 NOTE — Telephone Encounter (Signed)
   [facility] Pre-operative Risk Assessment    Patient Name: Brandy [name]  DOB: Mar 24, 1966 MRN: [id number]  HEARTCARE STAFF:  - IMPORTANT!!!!!! Under Visit Info/Reason for Call, type in Other and utilize the format Clearance MM/DD/YY or Clearance TBD. Do not use dashes or single digits. - Please review there is not already an duplicate clearance open for this procedure. - If request is for dental extraction, please clarify the # of teeth to be extracted. - If the patient is currently at the dentist's office, call Pre-Op Callback Staff (MA/nurse) to input urgent request.  - If the patient is not currently in the dentist office, please route to the Pre-Op pool.  Request for surgical clearance:  What type of surgery is being performed? LAPAROSCOPIC GASTRIC BYPASS  When is this surgery scheduled? TBD  What type of clearance is required (medical clearance vs. Pharmacy clearance to hold med vs. Both)? MEDICAL  Are there any medications that need to be held prior to surgery and how long?  ASA   Practice name and name of physician performing surgery? [facility]; DR. Gurney Maxin  What is the office phone number? [phone]   7.   What is the office fax number? [phone] ATTN: [place] HALE, BARIATRIC NAVIGATOR   8.   Anesthesia type (None, local, MAC, general) ? GENERAL    Julaine Hua 12/22/2020, 9:19 AM  _________________________________________________________________   (provider comments below)

## 2020-12-23 ENCOUNTER — Other Ambulatory Visit: Payer: Self-pay | Admitting: General Surgery

## 2020-12-27 ENCOUNTER — Other Ambulatory Visit: Payer: Self-pay | Admitting: General Surgery

## 2020-12-29 ENCOUNTER — Ambulatory Visit (INDEPENDENT_AMBULATORY_CARE_PROVIDER_SITE_OTHER): Payer: BC Managed Care – PPO | Admitting: Psychology

## 2020-12-29 DIAGNOSIS — F3289 Other specified depressive episodes: Secondary | ICD-10-CM | POA: Diagnosis not present

## 2021-01-04 ENCOUNTER — Ambulatory Visit
Admission: RE | Admit: 2021-01-04 | Discharge: 2021-01-04 | Disposition: A | Discharge status: Home / Self Care | Payer: BC Managed Care – PPO | Source: Ambulatory Visit | Attending: General Surgery | Admitting: General Surgery

## 2021-01-04 ENCOUNTER — Other Ambulatory Visit: Payer: Self-pay

## 2021-01-04 DIAGNOSIS — K219 Gastro-esophageal reflux disease without esophagitis: Secondary | ICD-10-CM | POA: Diagnosis not present

## 2021-01-04 DIAGNOSIS — K224 Dyskinesia of esophagus: Secondary | ICD-10-CM | POA: Diagnosis not present

## 2021-01-04 DIAGNOSIS — Z01818 Encounter for other preprocedural examination: Secondary | ICD-10-CM | POA: Diagnosis not present

## 2021-01-04 DIAGNOSIS — K760 Fatty (change of) liver, not elsewhere classified: Secondary | ICD-10-CM | POA: Diagnosis not present

## 2021-01-04 DIAGNOSIS — R109 Unspecified abdominal pain: Secondary | ICD-10-CM | POA: Diagnosis not present

## 2021-01-04 DIAGNOSIS — E119 Type 2 diabetes mellitus without complications: Secondary | ICD-10-CM | POA: Diagnosis not present

## 2021-01-04 DIAGNOSIS — G4733 Obstructive sleep apnea (adult) (pediatric): Secondary | ICD-10-CM | POA: Diagnosis not present

## 2021-01-04 NOTE — Progress Notes (Signed)
Cardiology Office Note:    Date:  01/05/2021   ID:  Brandy [name], DOB Jan 02, 1966, MRN [id number]  PCP:  Doreene Nest, NP  [facility] Cardiologist:  Julien Nordmann, MD  [facility] Electrophysiologist:  None   Referring MD: Doreene Nest, NP   Chief Complaint: syncope, pre-op for bariatric surgery  History of Present Illness:    Brandy [name] is a 55 y.o. female with a hx of morbid obesity, HTN, anxiety, sleep study negative for OSA, DM2 who presents for follow-up  She had normal ABIs in 2020.  Last seen 03/2019 and reported chronic fatigue/SOB, EKG benign, very sedentary lifestyle. Leg edema improved on HCTZ. Lipitor was held for joint pain.   Today, she reports 3 episodes of near syncope. Happens in the shower when she is sitting. When the water is hot she starts getting flushed and lightheaded and feels as if she is going to faint. She opens the door and this doesn't help. The most recent time she called her husband who helped her get to the bed. She didn't pass out but felt really faint.Lays in the bed for 20-30 minutes before feeling normal. Reports she ate before the episodes. She feels shortness of breath. She has chronic DOE. NO palpitations, no vision changes, numbness or tingling. No worsening LLE, orthopnea, pnd.   No smoking history, alcohol, drug use. Maternal grandma with heart issues. Patient unable to stand for long periods of time due to lower back pain. Patient able to walk around the house, go to the bathroom Helping more with cooking and cleaning.   Plan to get bariatric surgery once cleared.   Past Medical History:  Diagnosis Date   Anxiety    Arthritis    Back pain    Brain fog    CHF (congestive heart failure) (HCC)    pt not aware of this   Depression    Diabetes mellitus without complication (HCC)    Difficulty walking    Dysmetabolic syndrome X    Dyspnea    Fatigue    Fibromyalgia    Hypertension    Hypertriglyceridemia     Joint pain    Leg weakness    Lower extremity edema    Muscle atrophy    Neuropathy    Obesity    Osteoarthritis    PONV (postoperative nausea and vomiting)    Poor memory    Post-menopausal bleeding    Prediabetes    Rosacea    Sleep apnea    not on cpap yet   Tetanus vaccine causing adverse effect in therapeutic use    Vitamin D deficiency     Past Surgical History:  Procedure Laterality Date   BONE TUMOR EXCISION  1975   Right leg   DILATATION & CURETTAGE/HYSTEROSCOPY WITH MYOSURE N/A 10/07/2019   Procedure: DILATATION AND CURETTAGE /HYSTEROSCOPY, Myosure, Polypectomy;  Surgeon: [place] Bing, MD;  Location: [facility] OR;  Service: Gynecology;  Laterality: N/A;   ENDOMETRIAL BIOPSY  07/14/2019       epidural steroid injection      Current Medications: Current Meds  Medication Sig   acetaminophen (TYLENOL) 650 MG CR tablet Take 1,950 mg by mouth every 8 (eight) hours as needed for pain.    aspirin EC 81 MG tablet Take 81 mg by mouth daily.   atorvastatin (LIPITOR) 10 MG tablet Take 1 tablet (10 mg total) by mouth daily. For cholesterol.   celecoxib (CELEBREX) 100 MG capsule Take 1 capsule (100 mg  total) by mouth 2 (two) times daily. As needed for pain.   cetirizine (ZYRTEC) 10 MG tablet Take 1 tablet (10 mg total) by mouth daily. For allergies   citalopram (CELEXA) 40 MG tablet Take 40 mg by mouth daily.   DULoxetine (CYMBALTA) 30 MG capsule Take 30 mg by mouth daily. Take with 60 mg to equal 90 mg daily   DULoxetine (CYMBALTA) 60 MG capsule Take 60 mg by mouth daily. Take with 30 mg to equal 90 mg daily   folic acid (FOLVITE) 1 MG tablet Take 1 mg by mouth daily.   gabapentin (NEURONTIN) 300 MG capsule TAKE 1 CAPSULE BY MOUTH 3 TIMES A DAY AS NEEDED FOR BACK PAIN   glipiZIDE (GLUCOTROL) 10 MG tablet TAKE 1 TABLET (10 MG TOTAL) BY MOUTH 2 (TWO) TIMES DAILY BEFORE A MEAL. FOR DIABETES.   hydrocortisone 2.5 % cream Apply 1 application topically 2 (two) times daily.    Insulin Pen  Needle (PEN NEEDLES) 31G X 6 MM MISC Use nightly with insulin.   lamoTRIgine (LAMICTAL) 100 MG tablet TAKE 1 TABLET BY MOUTH EVERYDAY AT BEDTIME   lamoTRIgine (LAMICTAL) 200 MG tablet Take 200 mg by mouth daily.   lisinopril (ZESTRIL) 30 MG tablet TAKE 1 TABLET (30 MG TOTAL) BY MOUTH DAILY. FOR BLOOD PRESSURE   metFORMIN (GLUCOPHAGE-XR) 500 MG 24 hr tablet TAKE 2 TABLETS (1,000 MG TOTAL) BY MOUTH DAILY WITH BREAKFAST. FOR DIABETES.   metroNIDAZOLE (METROCREAM) 0.75 % cream Apply 1 application topically daily.   traZODone (DESYREL) 50 MG tablet    VITAMIN D, CHOLECALCIFEROL, PO Take by mouth daily.   Vitamin D, Ergocalciferol, (DRISDOL) 1.25 MG (50000 UNIT) CAPS capsule Take 1 capsule by mouth once weekly for vitamin D.     Allergies:   Diphenhydramine hcl and Penicillins   Social History   Socioeconomic History   Marital status: Married    Spouse name: Ed   Number of children: 3   Years of education: Not on file   Highest education level: Not on file  Occupational History   Occupation: Investment banker, corporate Retirement    Employer: [employer]  Tobacco Use   Smoking status: Never   Smokeless tobacco: Never  Vaping Use   Vaping Use: Never used  Substance and Sexual Activity   Alcohol use: Yes    Comment: rarely - once a year   Drug use: No   Sexual activity: Not on file  Other Topics Concern   Not on file  Social History Narrative   Best boy      Married; 3 kids (middle child-severely autistic)      No regular exercise   Social Determinants of Health   Financial Resource Strain: Not on file  Food Insecurity: Not on file  Transportation Needs: Not on file  Physical Activity: Not on file  Stress: Not on file  Social Connections: Not on file     Family History: The patient's family history includes Alcohol abuse in her maternal grandfather and maternal grandmother; Alcoholism in her father; Aneurysm in her paternal grandmother; Anxiety disorder in  her mother; Autism in her son; COPD in her mother; Depression in her father and mother; Diabetes in her paternal aunt; Eating disorder in her mother; Heart disease in her maternal grandmother; Hypertension in her father; Obesity in her mother; Rheum arthritis in her mother; Stroke in her maternal grandmother and mother. There is no history of Breast cancer.  ROS:   Please see the history of  present illness.     All other systems reviewed and are negative.  EKGs/Labs/Other Studies Reviewed:    The following studies were reviewed today:  ABIs 08/2018 Summary:  Right: Resting right ankle-brachial index is within normal range. No  evidence of significant right lower extremity arterial disease. The right  toe-brachial index is normal.   Left: Resting left ankle-brachial index is within normal range. No  evidence of significant left lower extremity arterial disease. The left  toe-brachial index is normal.   EKG:  EKG is  ordered today.  The ekg ordered today demonstrates NSR, 80bpm, nonspecific T wave abnormality   Recent Labs: 06/01/2020: ALT 9; BUN 24; Creatinine, Ser 0.91; Potassium 5.1; Sodium 135 10/04/2020: Hemoglobin 12.8; Platelets 333.0; TSH 2.51  Recent Lipid Panel    Component Value Date/Time   CHOL 137 06/01/2020 1229   CHOL 162 08/25/2019 1337   TRIG 152.0 (H) 06/01/2020 1229   HDL 57.30 06/01/2020 1229   HDL 69 08/25/2019 1337   CHOLHDL 2 06/01/2020 1229   VLDL 30.4 06/01/2020 1229   LDLCALC 49 06/01/2020 1229   LDLCALC 73 08/25/2019 1337   LDLDIRECT 108.3 09/14/2010 1643     Physical Exam:    VS:  BP 137/89 (BP Location: Left Arm, Patient Position: Sitting, Cuff Size: Large)   Pulse 80   Ht 5\' 6"  (1.676 m)   Wt (!) 361 lb (163.7 kg)   LMP  (LMP Unknown)   SpO2 97%   BMI 58.27 kg/m     Wt Readings from Last 3 Encounters:  01/05/21 (!) 361 lb (163.7 kg)  10/04/20 (!) 366 lb (166 kg)  06/01/20 (!) 357 lb (161.9 kg)     GEN:  Well nourished, well developed in  no acute distress HEENT: Normal NECK: No JVD; No carotid bruits LYMPHATICS: No lymphadenopathy CARDIAC: RRR, no murmurs, rubs, gallops RESPIRATORY:  Clear to auscultation without rales, wheezing or rhonchi  ABDOMEN: Soft, non-tender, non-distended MUSCULOSKELETAL:  No edema; No deformity  SKIN: Warm and dry NEUROLOGIC:  Alert and oriented x 3 PSYCHIATRIC:  Normal affect   ASSESSMENT:    1. Near syncope   2. SOB (shortness of breath)   3. Chronic diastolic heart failure (HCC)   4. Morbid obesity (HCC)   5. Type 2 diabetes mellitus with other circulatory complication, unspecified whether long term insulin use (HCC)   6. Essential hypertension   7. Preoperative cardiovascular examination    PLAN:    In order of problems listed above:  Preoperative cardiac evaluation Presents for pre-operative cardiac evaluation for bariatric surgery. Patient has no significant cardiac history. Reports maternal grandmother with cardiac issues. She is nonsmoker with no alcohol or drug use. She reports 3 near syncopal episodes that sound vasovagal in nature but cannot r/u arrhythmia or significant cardiac cause. Orthostatics negative. EKG with NSR and nonspecific T wave changes. I will order 2 week heart monitor. She has no chest pain but does reports DOE. I will check echo. She is sedentary at baseline, able to only walk around the house, METS<4.  Surgeon obtained labs yesterday, will await results. Would also pursue PCP visit for other non-cardiac reasons for syncope. According to the Revised cardiac Risk index she is at class 3 risk with 10.1% 30 day risk of death, MI, or cardiac arrest. Would hold on surgery until results of cardiac testing.   Near syncope Sounds vasovagal in nature. Orthostatics negative today. Will get 2 week heart monitor as above. Surgeon recently obtained labs.  HFpEF No prior echo in records. She reports worse dyspnea on exertion. No LLE, orthopnea, pnd. I will order  echo.  HLD LDL 49 05/2020. Continue Lipitor 10mg  daily.   HTN BP mildly elevated today. Continue lisinopril 30mg  daily. May need addition of medications at follow-up if high.   DM2 A1C 8.6. Continue to follow with PCP.  Disposition: Follow up in 6 week(s) with MD/APP    Signed, Janasha Barkalow , PA-C  01/05/2021 8:21 PM    [facility]

## 2021-01-05 ENCOUNTER — Ambulatory Visit (INDEPENDENT_AMBULATORY_CARE_PROVIDER_SITE_OTHER): Payer: BC Managed Care – PPO | Admitting: Medical

## 2021-01-05 ENCOUNTER — Encounter: Payer: Self-pay | Admitting: Medical

## 2021-01-05 ENCOUNTER — Ambulatory Visit (INDEPENDENT_AMBULATORY_CARE_PROVIDER_SITE_OTHER): Payer: BC Managed Care – PPO

## 2021-01-05 ENCOUNTER — Ambulatory Visit (INDEPENDENT_AMBULATORY_CARE_PROVIDER_SITE_OTHER): Payer: BC Managed Care – PPO | Admitting: Psychology

## 2021-01-05 VITALS — BP 137/89 | HR 80 | Ht 66.0 in | Wt 361.0 lb

## 2021-01-05 DIAGNOSIS — R0602 Shortness of breath: Secondary | ICD-10-CM

## 2021-01-05 DIAGNOSIS — F3289 Other specified depressive episodes: Secondary | ICD-10-CM

## 2021-01-05 DIAGNOSIS — R55 Syncope and collapse: Secondary | ICD-10-CM | POA: Diagnosis not present

## 2021-01-05 DIAGNOSIS — Z01810 Encounter for preprocedural cardiovascular examination: Secondary | ICD-10-CM

## 2021-01-05 DIAGNOSIS — I10 Essential (primary) hypertension: Secondary | ICD-10-CM

## 2021-01-05 DIAGNOSIS — I5032 Chronic diastolic (congestive) heart failure: Secondary | ICD-10-CM

## 2021-01-05 DIAGNOSIS — E1159 Type 2 diabetes mellitus with other circulatory complications: Secondary | ICD-10-CM

## 2021-01-05 NOTE — Patient Instructions (Signed)
Medication Instructions:  Your physician recommends that you continue on your current medications as directed. Please refer to the Current Medication list given to you today.  *If you need a refill on your cardiac medications before your next appointment, please call your pharmacy*   Lab Work: None ordered If you have labs (blood work) drawn today and your tests are completely normal, you will receive your results only by: [place] (if you have MyChart) OR A paper copy in the mail If you have any lab test that is abnormal or we need to change your treatment, we will call you to review the results.   Testing/Procedures:   Your physician has requested that you have an echocardiogram. Echocardiography is a painless test that uses sound waves to create images of your heart. It provides your doctor with information about the size and shape of your heart and how well your heart's chambers and valves are working. This procedure takes approximately one hour. There are no restrictions for this procedure.  2.   Your physician has recommended that you wear a Zio XT monitor for 2 weeks.   This monitor is a medical device that records the heart's electrical activity. Doctors most often use these monitors to diagnose arrhythmias. Arrhythmias are problems with the speed or rhythm of the heartbeat. The monitor is a small device applied to your chest. You can wear one while you do your normal daily activities. While wearing this monitor if you have any symptoms to push the button and record what you felt. Once you have worn this monitor for the period of time provider prescribed (Usually 14 days), you will return the monitor device in the postage paid box. Once it is returned they will download the data collected and provide [place] with a report which the provider will then review and we will call you with those results. Important tips:  Avoid showering during the first 24 hours of wearing the  monitor. Avoid excessive sweating to help maximize wear time. Do not submerge the device, no hot tubs, and no swimming pools. Keep any lotions or oils away from the patch. After 24 hours you may shower with the patch on. Take brief showers with your back facing the shower head.  Do not remove patch once it has been placed because that will interrupt data and decrease adhesive wear time. Push the button when you have any symptoms and write down what you were feeling. Once you have completed wearing your monitor, remove and place into box which has postage paid and place in your outgoing mailbox.  If for some reason you have misplaced your box then call our office and we can provide another box and/or mail it off for you.     Follow-Up: At [facility], you and your health needs are our priority.  As part of our continuing mission to provide you with exceptional heart care, we have created designated Provider Care Teams.  These Care Teams include your primary Cardiologist (physician) and Advanced Practice Providers (APPs -  Physician Assistants and Nurse Practitioners) who all work together to provide you with the care you need, when you need it.  We recommend signing up for the patient portal called "MyChart".  Sign up information is provided on this After Visit Summary.  MyChart is used to connect with patients for Virtual Visits (Telemedicine).  Patients are able to view lab/test results, encounter notes, upcoming appointments, etc.  Non-urgent messages can be sent to your provider as  well.   To learn more about what you can do with MyChart, go to [email or web address].    Your next appointment:   5-6 week(s)  The format for your next appointment:   In Person  Provider:   You may see Julien Nordmann, MD or one of the following Advanced Practice Providers on your designated Care Team:   Nicolasa Ducking, NP Eula Listen, PA-C Marisue Ivan, PA-C Cadence Fransico Michael, [place]   Other  Instructions

## 2021-01-09 ENCOUNTER — Other Ambulatory Visit: Payer: Self-pay

## 2021-01-09 ENCOUNTER — Encounter: Payer: Self-pay | Admitting: Dietician

## 2021-01-09 ENCOUNTER — Encounter: Payer: BC Managed Care – PPO | Attending: General Surgery | Admitting: Dietician

## 2021-01-09 VITALS — Ht 67.0 in | Wt 365.2 lb

## 2021-01-09 DIAGNOSIS — Z6841 Body Mass Index (BMI) 40.0 and over, adult: Secondary | ICD-10-CM

## 2021-01-09 NOTE — Patient Instructions (Addendum)
Reduce and/or avoid sweet breads including raisin bread.  Continue to concentrate on lean protein foods and low carb veggies and limit starches and fruits. Keep monitoring chewing and chew foods thoroughly. [employer] app can help with food and meal ideas and tracking food and fluid intake.

## 2021-01-09 NOTE — Progress Notes (Signed)
Nutrition Assessment for Bariatric Surgery   Appointment start time: 1110   end time: 1230  Planned Surgery: RnY Gastric Bypass  Anthropometrics: Weight: 365.2lbs Height: 5'7" BMI: 57.2   Patient's person goal weight: 160lbs  Clinical: Medical History: Diabetes, hypertension, sleep apnea, chronic back and leg pain, arthritis Medications and Supplements: acetaminophen, atorvastatin, celecoxib, cetirizine, citalopram, DULoxetine, gabapentin, glipiZIDE, limoTRIgine, lisinopril, metFORMIN, traZODone, vitamin D, vitamin B12, (06/01/20) Total cholesterol 137, Triglycerides 152, HDL 57.3, LDL 49 Relevant labs: (10/04/20) HbA1C 8.6%, vitamin B12 550pg/ml, vitamin D 33ng/ml,  Notable symptoms: back and leg pain Drug allergies: penecillins, diphenhydramine Food allergies: none known  Lifestyle and Dietary History:  Dieting/ weight history:  Patient reports long struggle with weight, has tried multiple diets and programs including:  Healthy Weight and Wellness program, diabetes education program, [employer] watchers, Nutri-system, Slim-fast, keto, atkins  -- all with limited and/or  short term success only.  Also took phentermine which also helped only short-term.   Disordered or emotional eating history:  No eating disorder diagnosis Does feel she engages in some emotional eating when stressed or bored.   Physical activity: sporadic 15-20 minutes walking  Dietary Recall:  Daily pattern: 3 meals and 1-2 snacks. Dining out: 1-2 meals per week. Breakfast: most days omelet with cheese, often salsa, meat, veg  + fruit or raisin bread Snack: none Lunch: sandwich + fruit or sometimes only fruit Snack: fruit; nuts; bell pepper or tomatoes Supper: physically dfificult to cook -- meat + veg x2 or meat + potato + veg; occasionally takeout or frozen meal ie lasagna; healthy choices or lean cuisine; soup Snack: fat free pudding cup; graham cracker and milk Beverages: water, unsweetened tea, crystal  light   Nutrition Intervention: Instructed patient on pre-op diet goals and importance of close adherence to bariatric diet after surgery to avoid side effects and complications.  Discussed stages of the bariatric diet after surgery as well as the importance of adequate protein and fluid intake.  Discussed importance of having non-food, healthy strategies to cope with emotions  Provided overview of 2-week pre-op diet.  Patient has been making positive diet and lifestyle changes to begin weight loss.   Nutrition Diagnosis: [place]-3.3 Overweight/ obesity related to history of excess calories and inadequate physical activity as evidenced by patient with current BMI of 57.2, following eating pattern conducive to weight loss prior to bariatric surgery.  Teaching method(s) utilized: Risk manager provided: Pre-op Goals Diet Stage Template   Learning Readiness: Change in progress  Barriers to learning/ implementing lifestyle change: none  Demonstrated degree of understanding via: Teach Back  Summary: Patient has begun making diet and lifestyle changes in effort to lose weight and prepare for bariatric surgery. She is motivated to continue. Patient's personal goal for weight loss is pain reduction and improved health. She has solid support from spouse and family, healthcare providers.  She agrees to work on further reduction in carbohydrate intake, chewing foods more thoroughly, and investigating bariatric food and meal ideas via online app prior to surgery.  She voices understanding of, and is motivated to follow the bariatric diet after surgery.  From a nutrition standpoint, she is ready to proceed with the bariatric surgery program and will be a good candidate for surgery.    Plan: Patient commits to returning for RN and RD-led pre-op class prior to surgery.  She will plan to return for post-op RD visits beginning 2 weeks after surgery.

## 2021-01-11 ENCOUNTER — Ambulatory Visit (INDEPENDENT_AMBULATORY_CARE_PROVIDER_SITE_OTHER): Payer: BC Managed Care – PPO | Admitting: Psychology

## 2021-01-11 ENCOUNTER — Other Ambulatory Visit: Payer: Self-pay | Admitting: Primary Care

## 2021-01-11 DIAGNOSIS — E559 Vitamin D deficiency, unspecified: Secondary | ICD-10-CM

## 2021-01-11 DIAGNOSIS — F509 Eating disorder, unspecified: Secondary | ICD-10-CM | POA: Diagnosis not present

## 2021-01-12 ENCOUNTER — Ambulatory Visit (INDEPENDENT_AMBULATORY_CARE_PROVIDER_SITE_OTHER): Payer: BC Managed Care – PPO | Admitting: Psychology

## 2021-01-12 DIAGNOSIS — F3289 Other specified depressive episodes: Secondary | ICD-10-CM | POA: Diagnosis not present

## 2021-01-19 ENCOUNTER — Ambulatory Visit (INDEPENDENT_AMBULATORY_CARE_PROVIDER_SITE_OTHER): Payer: BC Managed Care – PPO | Admitting: Psychology

## 2021-01-19 DIAGNOSIS — F3289 Other specified depressive episodes: Secondary | ICD-10-CM

## 2021-01-20 ENCOUNTER — Other Ambulatory Visit: Payer: Self-pay | Admitting: Primary Care

## 2021-01-20 DIAGNOSIS — E785 Hyperlipidemia, unspecified: Secondary | ICD-10-CM

## 2021-01-23 ENCOUNTER — Other Ambulatory Visit: Payer: Self-pay

## 2021-01-23 ENCOUNTER — Ambulatory Visit
Admission: RE | Admit: 2021-01-23 | Discharge: 2021-01-23 | Disposition: A | Discharge status: Home / Self Care | Payer: BC Managed Care – PPO | Source: Ambulatory Visit | Attending: Primary Care | Admitting: Primary Care

## 2021-01-23 DIAGNOSIS — Z1231 Encounter for screening mammogram for malignant neoplasm of breast: Secondary | ICD-10-CM | POA: Diagnosis not present

## 2021-01-25 ENCOUNTER — Ambulatory Visit (INDEPENDENT_AMBULATORY_CARE_PROVIDER_SITE_OTHER): Payer: BC Managed Care – PPO | Admitting: Psychology

## 2021-01-25 DIAGNOSIS — F509 Eating disorder, unspecified: Secondary | ICD-10-CM

## 2021-01-26 ENCOUNTER — Ambulatory Visit (INDEPENDENT_AMBULATORY_CARE_PROVIDER_SITE_OTHER): Payer: BC Managed Care – PPO | Admitting: Psychology

## 2021-01-26 DIAGNOSIS — F3289 Other specified depressive episodes: Secondary | ICD-10-CM

## 2021-02-02 ENCOUNTER — Other Ambulatory Visit: Payer: Self-pay | Admitting: Primary Care

## 2021-02-02 ENCOUNTER — Ambulatory Visit (INDEPENDENT_AMBULATORY_CARE_PROVIDER_SITE_OTHER): Payer: BC Managed Care – PPO | Admitting: Psychology

## 2021-02-02 DIAGNOSIS — J302 Other seasonal allergic rhinitis: Secondary | ICD-10-CM

## 2021-02-02 DIAGNOSIS — F3289 Other specified depressive episodes: Secondary | ICD-10-CM

## 2021-02-07 ENCOUNTER — Ambulatory Visit (INDEPENDENT_AMBULATORY_CARE_PROVIDER_SITE_OTHER): Payer: BC Managed Care – PPO

## 2021-02-07 ENCOUNTER — Ambulatory Visit: Payer: BC Managed Care – PPO

## 2021-02-07 ENCOUNTER — Other Ambulatory Visit: Payer: Self-pay

## 2021-02-07 DIAGNOSIS — R0602 Shortness of breath: Secondary | ICD-10-CM | POA: Diagnosis not present

## 2021-02-07 MED ORDER — PERFLUTREN LIPID MICROSPHERE
1.0000 mL | INTRAVENOUS | Status: AC | PRN
Start: 2021-02-07 — End: 2021-02-07
  Administered 2021-02-07: 2 mL via INTRAVENOUS

## 2021-02-08 LAB — ECHOCARDIOGRAM COMPLETE
AV Mean grad: 5 mmHg
AV Peak grad: 8.4 mmHg
Ao pk vel: 1.45 m/s
Area-P 1/2: 4.17 cm2

## 2021-02-09 ENCOUNTER — Telehealth: Payer: Self-pay

## 2021-02-09 ENCOUNTER — Ambulatory Visit (INDEPENDENT_AMBULATORY_CARE_PROVIDER_SITE_OTHER): Payer: BC Managed Care – PPO | Admitting: Psychology

## 2021-02-09 DIAGNOSIS — F3289 Other specified depressive episodes: Secondary | ICD-10-CM | POA: Diagnosis not present

## 2021-02-09 NOTE — Telephone Encounter (Signed)
Left detail message on VM of pt's recent results okay by DPR, Cadence Furth, PA-C advised   "Echo showed LVEF 50-55%, poor imaging quality, but overall nothing significant to explain syncope "  At this time, no further recommendations or medications changes, advised to call office for any concerns or questions, otherwise will see at next visit.   Results also posted on MyChart for review.

## 2021-02-13 NOTE — Progress Notes (Signed)
Cardiology Office Note:    Date:  02/15/2021   ID:  Brandy [name], DOB [birth year], MRN [id number]  PCP:  Doreene Nest, NP  [facility] Cardiologist:  Julien Nordmann, MD  [facility] Electrophysiologist:  None   Referring MD: Doreene Nest, NP   Chief Complaint: 6 week follow-up for cardiac pre-op eval  History of Present Illness:    Brandy [name] is a 55 y.o. female with a hx of with a hx of morbid obesity, HTN, anxiety, sleep study negative for OSA, DM2 who presents for follow-up   She had normal ABIs in 2020.   Seen 03/2019 and reported chronic fatigue/SOB, EKG benign, very sedentary lifestyle. Leg edema improved on HCTZ. Lipitor was held for joint pain.   Last seen 01/05/21 and reported 3 episodes of near syncope. Planning on bariatric surgery pending cardiac evaluation. Syncope sounded vasovagal. Orthostatics negative. Heart monitor and echo were ordered.    Today, patient denies lightheadedness or dizziness. No LOC or syncope. Says she feels off balance a little when she first gets up, this happens occasionally. No chest pain. She has unchanged DOE. She has lost about 10 or 12 lbs. Doesn't have surgery scheduled yet. Goal is to get scheduled before the end of the year. No LLE, orthopnea, pnd.     Past Medical History:  Diagnosis Date   Anxiety    Arthritis    Back pain    Brain fog    CHF (congestive heart failure) (HCC)    pt not aware of this   Depression    Diabetes mellitus without complication (HCC)    Difficulty walking    Dysmetabolic syndrome X    Dyspnea    Fatigue    Fibromyalgia    Hypertension    Hypertriglyceridemia    Joint pain    Leg weakness    Lower extremity edema    Muscle atrophy    Neuropathy    Obesity    Osteoarthritis    PONV (postoperative nausea and vomiting)    Poor memory    Post-menopausal bleeding    Prediabetes    Rosacea    Sleep apnea    not on cpap yet   Tetanus vaccine causing adverse effect in  therapeutic use    Vitamin D deficiency     Past Surgical History:  Procedure Laterality Date   BONE TUMOR EXCISION  1975   Right leg   DILATATION & CURETTAGE/HYSTEROSCOPY WITH MYOSURE N/A 10/07/2019   Procedure: DILATATION AND CURETTAGE /HYSTEROSCOPY, Myosure, Polypectomy;  Surgeon: [place] Bing, MD;  Location: [facility] OR;  Service: Gynecology;  Laterality: N/A;   ENDOMETRIAL BIOPSY  07/14/2019       epidural steroid injection      Current Medications: Current Meds  Medication Sig   acetaminophen (TYLENOL) 650 MG CR tablet Take 1,950 mg by mouth every 8 (eight) hours as needed for pain.    atorvastatin (LIPITOR) 10 MG tablet TAKE 1 TABLET BY MOUTH DAILY. FOR CHOLESTEROL   celecoxib (CELEBREX) 100 MG capsule Take 1 capsule (100 mg total) by mouth 2 (two) times daily. As needed for pain.   cetirizine (ZYRTEC) 10 MG tablet TAKE 1 TABLET (10 MG TOTAL) BY MOUTH DAILY. FOR ALLERGIES   citalopram (CELEXA) 40 MG tablet Take 40 mg by mouth daily.   DULoxetine (CYMBALTA) 30 MG capsule Take 30 mg by mouth daily. Take with 60 mg to equal 90 mg daily   DULoxetine (CYMBALTA) 60 MG capsule Take 60  mg by mouth daily. Take with 30 mg to equal 90 mg daily   folic acid (FOLVITE) 1 MG tablet Take 1 mg by mouth daily.   gabapentin (NEURONTIN) 300 MG capsule TAKE 1 CAPSULE BY MOUTH 3 TIMES A DAY AS NEEDED FOR BACK PAIN   glipiZIDE (GLUCOTROL) 10 MG tablet TAKE 1 TABLET (10 MG TOTAL) BY MOUTH 2 (TWO) TIMES DAILY BEFORE A MEAL. FOR DIABETES.   lamoTRIgine (LAMICTAL) 100 MG tablet TAKE 1 TABLET BY MOUTH EVERYDAY AT BEDTIME   lamoTRIgine (LAMICTAL) 200 MG tablet Take 200 mg by mouth daily.   lisinopril (ZESTRIL) 30 MG tablet TAKE 1 TABLET (30 MG TOTAL) BY MOUTH DAILY. FOR BLOOD PRESSURE   metFORMIN (GLUCOPHAGE-XR) 500 MG 24 hr tablet TAKE 2 TABLETS (1,000 MG TOTAL) BY MOUTH DAILY WITH BREAKFAST. FOR DIABETES.   metroNIDAZOLE (METROCREAM) 0.75 % cream Apply 1 application topically daily.   traZODone (DESYREL) 50  MG tablet    VITAMIN D, CHOLECALCIFEROL, PO Take by mouth daily.   Vitamin D, Ergocalciferol, (DRISDOL) 1.25 MG (50000 UNIT) CAPS capsule TAKE 1 CAPSULE BY MOUTH ONCE WEEKLY FOR VITAMIN D.     Allergies:   Diphenhydramine hcl and Penicillins   Social History   Socioeconomic History   Marital status: Married    Spouse name: Ed   Number of children: 3   Years of education: Not on file   Highest education level: Not on file  Occupational History   Occupation: Investment banker, corporate Retirement    Employer: [employer]  Tobacco Use   Smoking status: Never   Smokeless tobacco: Never  Vaping Use   Vaping Use: Never used  Substance and Sexual Activity   Alcohol use: Not Currently    Comment: rarely - once a year   Drug use: No   Sexual activity: Not on file  Other Topics Concern   Not on file  Social History Narrative   Best boy      Married; 3 kids (middle child-severely autistic)      No regular exercise   Social Determinants of Health   Financial Resource Strain: Not on file  Food Insecurity: Not on file  Transportation Needs: Not on file  Physical Activity: Not on file  Stress: Not on file  Social Connections: Not on file     Family History: The patient's family history includes Alcohol abuse in her maternal grandfather and maternal grandmother; Alcoholism in her father; Aneurysm in her paternal grandmother; Anxiety disorder in her mother; Autism in her son; COPD in her mother; Depression in her father and mother; Diabetes in her paternal aunt; Eating disorder in her mother; Heart disease in her maternal grandmother; Hypertension in her father; Obesity in her mother; Rheum arthritis in her mother; Stroke in her maternal grandmother and mother. There is no history of Breast cancer.  ROS:   Please see the history of present illness.     All other systems reviewed and are negative.  EKGs/Labs/Other Studies Reviewed:    The following studies were  reviewed today:  Echo 01/2021  1. Left ventricular ejection fraction, by estimation, is 50 to 55%. The  left ventricle has low normal function. Left ventricular endocardial  border not optimally defined to evaluate regional wall motion. There is  not well visualized left ventricular  hypertrophy. Left ventricular diastolic parameters are indeterminate.   2. Right ventricular systolic function is normal. The right ventricular  size is not well visualized.   3. The mitral valve was  not well visualized. No evidence of mitral valve  regurgitation.   4. The aortic valve was not well visualized. Aortic valve regurgitation  is not visualized.   Event monitor 01/2021 Event Monitor Patch Wear Time:  14 days and 0 hours (2022-09-08T14:44:12-398 to 2022-09-22T14:44:13-0400)   Patient had a min HR of 59 bpm, max HR of 187 bpm, and avg HR of 86 bpm.  Predominant underlying rhythm was Sinus Rhythm.     8 Supraventricular Tachycardia/atrial tachycardia runs occurred, the run with the fastest interval lasting 6 beats with a max rate of 187 bpm, the longest lasting 18 beats with an avg rate of 128 bpm.   Isolated SVEs were rare (<1.0%), SVE Couplets were rare (<1.0%), and SVE Triplets were rare (<1.0%). Isolated VEs were rare (<1.0%), and no VE Couplets or VE  Triplets were present.   Patient triggered events (2) associated with normal sinus rhythm.   Signed, Dossie Arbour, MD, Ph.D [facility]      EKG:  EKG is not ordered today.   Recent Labs: 06/01/2020: ALT 9; BUN 24; Creatinine, Ser 0.91; Potassium 5.1; Sodium 135 10/04/2020: Hemoglobin 12.8; Platelets 333.0; TSH 2.51  Recent Lipid Panel    Component Value Date/Time   CHOL 137 06/01/2020 1229   CHOL 162 08/25/2019 1337   TRIG 152.0 (H) 06/01/2020 1229   HDL 57.30 06/01/2020 1229   HDL 69 08/25/2019 1337   CHOLHDL 2 06/01/2020 1229   VLDL 30.4 06/01/2020 1229   LDLCALC 49 06/01/2020 1229   LDLCALC 73 08/25/2019 1337   LDLDIRECT  108.3 09/14/2010 1643    Physical Exam:    VS:  BP 122/70 (BP Location: Left Arm, Patient Position: Sitting, Cuff Size: Normal)   Pulse 98   Ht 5\' 6"  (1.676 m)   Wt (!) 353 lb (160.1 kg)   LMP  (LMP Unknown)   SpO2 97%   BMI 56.98 kg/m     Wt Readings from Last 3 Encounters:  02/15/21 (!) 353 lb (160.1 kg)  01/09/21 (!) 365 lb 3.2 oz (165.7 kg)  01/05/21 (!) 361 lb (163.7 kg)     GEN:  Well nourished, well developed in no acute distress HEENT: Normal NECK: No JVD; No carotid bruits LYMPHATICS: No lymphadenopathy CARDIAC: RRR, no murmurs, rubs, gallops RESPIRATORY:  Clear to auscultation without rales, wheezing or rhonchi  ABDOMEN: Soft, non-tender, non-distended MUSCULOSKELETAL:  No edema; No deformity  SKIN: Warm and dry NEUROLOGIC:  Alert and oriented x 3 PSYCHIATRIC:  Normal affect   ASSESSMENT:    1. Preoperative cardiovascular examination   2. Essential hypertension, benign   3. Type 2 diabetes mellitus with other circulatory complication, unspecified whether long term insulin use (HCC)   4. Morbid obesity (HCC)   5. Near syncope   6. SOB (shortness of breath)   7. Chronic diastolic heart failure (HCC)   8. Essential hypertension    PLAN:    In order of problems listed above:  Near syncope Heart monitor showed predominately NSR with 8 runs of SVT , longest 18 beats.  No significant etiology to explain syncope. Echo showed LVEF 50-55% and no significant valve abnormality. Patient denies further syncope. No palpitations. She has occasional lightheadedness when she gets up. She is generally deconditioned and has chronic/unchanged shortness of breath. No chest pain. BP and heart rate good today. Orthostatics negative at the last visit. Suspect vasovagal etiology. No further work-up at this time.  HFpEF Echo showed low normal pump function, 50-55%, indeterminate diastolic dysfunction.  Patient is euvolemic on exam. She has chronic and unchanged dyspnea on exertion,  suspected more so from general deconditioning.   HLD LDL 49 05/2020.Continue Lipitor 10mg  daily  HTN BP good today. Continue lisinopril.   DM2 A1C 8.6. Followed by PCP  Pre-operative cardiac evaluation Cardiac work-up essentially unremarkable with echo and heart monitor. Unclear reason for prior syncope, but suspect more so vasovagal. She denies further episodes. No chest pain, LLE, orthopnea, pnd. Chronic DOE from generalized deconditioning. METS >4. According to the Revised cardiac Risk index she is at class 3 risk with 10.1% 30 day risk of death, MI, or cardiac arrest. Ok to proceed with surgery without further cardiac work-up. Monitor for recurrent syncope.  Disposition: Follow up in 3 month(s) with MD/APP   Signed, Jessah Danser , PA-C  02/15/2021 10:39 AM    [facility]

## 2021-02-15 ENCOUNTER — Other Ambulatory Visit: Payer: Self-pay

## 2021-02-15 ENCOUNTER — Ambulatory Visit (INDEPENDENT_AMBULATORY_CARE_PROVIDER_SITE_OTHER): Payer: BC Managed Care – PPO | Admitting: Medical

## 2021-02-15 ENCOUNTER — Encounter: Payer: Self-pay | Admitting: Medical

## 2021-02-15 VITALS — BP 122/70 | HR 98 | Ht 66.0 in | Wt 353.0 lb

## 2021-02-15 DIAGNOSIS — E1159 Type 2 diabetes mellitus with other circulatory complications: Secondary | ICD-10-CM

## 2021-02-15 DIAGNOSIS — I5032 Chronic diastolic (congestive) heart failure: Secondary | ICD-10-CM

## 2021-02-15 DIAGNOSIS — Z01810 Encounter for preprocedural cardiovascular examination: Secondary | ICD-10-CM | POA: Diagnosis not present

## 2021-02-15 DIAGNOSIS — R0602 Shortness of breath: Secondary | ICD-10-CM

## 2021-02-15 DIAGNOSIS — I10 Essential (primary) hypertension: Secondary | ICD-10-CM | POA: Diagnosis not present

## 2021-02-15 DIAGNOSIS — F39 Unspecified mood [affective] disorder: Secondary | ICD-10-CM | POA: Diagnosis not present

## 2021-02-15 DIAGNOSIS — F5105 Insomnia due to other mental disorder: Secondary | ICD-10-CM | POA: Diagnosis not present

## 2021-02-15 DIAGNOSIS — F411 Generalized anxiety disorder: Secondary | ICD-10-CM | POA: Diagnosis not present

## 2021-02-15 DIAGNOSIS — R55 Syncope and collapse: Secondary | ICD-10-CM

## 2021-02-15 NOTE — Patient Instructions (Signed)
Medication Instructions:  No changes at this time.  *If you need a refill on your cardiac medications before your next appointment, please call your pharmacy*   Lab Work: None  If you have labs (blood work) drawn today and your tests are completely normal, you will receive your results only by: MyChart Message (if you have MyChart) OR A paper copy in the mail If you have any lab test that is abnormal or we need to change your treatment, we will call you to review the results.   Testing/Procedures: None    Follow-Up: At [facility], you and your health needs are our priority.  As part of our continuing mission to provide you with exceptional heart care, we have created designated Provider Care Teams.  These Care Teams include your primary Cardiologist (physician) and Advanced Practice Providers (APPs -  Physician Assistants and Nurse Practitioners) who all work together to provide you with the care you need, when you need it.   Your next appointment:   3 month(s)  The format for your next appointment:   In Person  Provider:   You may see Julien Nordmann, MD or one of the following Advanced Practice Providers on your designated Care Team:   Nicolasa Ducking, NP Eula Listen, PA-C Marisue Ivan, PA-C Cadence [place], [place]

## 2021-02-16 ENCOUNTER — Ambulatory Visit: Payer: BC Managed Care – PPO | Admitting: Medical

## 2021-02-16 ENCOUNTER — Ambulatory Visit (INDEPENDENT_AMBULATORY_CARE_PROVIDER_SITE_OTHER): Payer: BC Managed Care – PPO | Admitting: Psychology

## 2021-02-16 DIAGNOSIS — F3289 Other specified depressive episodes: Secondary | ICD-10-CM

## 2021-02-23 ENCOUNTER — Ambulatory Visit (INDEPENDENT_AMBULATORY_CARE_PROVIDER_SITE_OTHER): Payer: BC Managed Care – PPO | Admitting: Psychology

## 2021-02-23 DIAGNOSIS — F3289 Other specified depressive episodes: Secondary | ICD-10-CM

## 2021-03-02 ENCOUNTER — Ambulatory Visit (INDEPENDENT_AMBULATORY_CARE_PROVIDER_SITE_OTHER): Payer: BC Managed Care – PPO | Admitting: Psychology

## 2021-03-02 DIAGNOSIS — F3289 Other specified depressive episodes: Secondary | ICD-10-CM

## 2021-03-09 ENCOUNTER — Ambulatory Visit (INDEPENDENT_AMBULATORY_CARE_PROVIDER_SITE_OTHER): Payer: BC Managed Care – PPO | Admitting: Psychology

## 2021-03-09 DIAGNOSIS — F3289 Other specified depressive episodes: Secondary | ICD-10-CM | POA: Diagnosis not present

## 2021-03-10 NOTE — Progress Notes (Signed)
Sent message, via epic in basket, requesting orders in epic from surgeon.  

## 2021-03-13 ENCOUNTER — Ambulatory Visit: Payer: Self-pay | Admitting: General Surgery

## 2021-03-15 ENCOUNTER — Other Ambulatory Visit (HOSPITAL_COMMUNITY): Payer: Self-pay

## 2021-03-16 ENCOUNTER — Ambulatory Visit (INDEPENDENT_AMBULATORY_CARE_PROVIDER_SITE_OTHER): Payer: BC Managed Care – PPO | Admitting: Psychology

## 2021-03-16 DIAGNOSIS — F3289 Other specified depressive episodes: Secondary | ICD-10-CM | POA: Diagnosis not present

## 2021-03-16 NOTE — Patient Instructions (Signed)
DUE TO COVID-19 ONLY ONE VISITOR IS ALLOWED TO COME WITH YOU AND STAY IN THE WAITING ROOM ONLY DURING PRE OP AND PROCEDURE DAY OF SURGERY.   Up to two visitors ages 16+ are allowed at one time in a patient's room.  The visitors may rotate out with other people throughout the day.  Additionally, up to two children between the ages of 4 and 21 are allowed and do not count toward the number of allowed visitors.  Children within this age range must be accompanied by an adult visitor.  One adult visitor may remain with the patient overnight and must be in the room by 8 PM.  YOU NEED TO HAVE A COVID 19 TEST ON__12-2-22_____ between 8am-3pm______, THIS TEST MUST BE DONE BEFORE SURGERY,     Please bring completed form with you to the COVID testing site   COVID TESTING SITE [address] [place]   [phone]      ONCE YOUR COVID TEST IS COMPLETED,  PLEASE Wear a mask when in public           Your procedure is scheduled on: 04-04-21   Report to [facility] Main  Entrance   Report to admitting at      0515  AM     Call this number if you have problems the morning of surgery [phone]   Remember: MORNING OF SURGERY DRINK:   DRINK 1 G2 drink BEFORE YOU LEAVE HOME, DRINK ALL OF THE  G2 DRINK AT ONE TIME.   NO SOLID FOOD AFTER 600 PM THE NIGHT BEFORE YOUR SURGERY. YOU MAY DRINK CLEAR FLUIDS. THE G2 DRINK YOU DRINK BEFORE YOU LEAVE HOME WILL BE THE LAST FLUIDS YOU DRINK BEFORE SURGERY.  PAIN IS EXPECTED AFTER SURGERY AND WILL NOT BE COMPLETELY ELIMINATED. AMBULATION AND TYLENOL WILL HELP REDUCE INCISIONAL AND GAS PAIN. MOVEMENT IS KEY!  YOU ARE EXPECTED TO BE OUT OF BED WITHIN 4 HOURS OF ADMISSION TO YOUR PATIENT ROOM.  SITTING IN THE RECLINER THROUGHOUT THE DAY IS IMPORTANT FOR DRINKING FLUIDS AND MOVING GAS THROUGHOUT THE GI TRACT.  COMPRESSION STOCKINGS SHOULD BE WORN [facility] STAY UNLESS YOU ARE WALKING.   INCENTIVE SPIROMETER SHOULD BE USED EVERY  HOUR WHILE AWAKE TO DECREASE POST-OPERATIVE COMPLICATIONS SUCH AS PNEUMONIA.  WHEN DISCHARGED HOME, IT IS IMPORTANT TO CONTINUE TO WALK EVERY HOUR AND USE THE INCENTIVE SPIROMETER EVERY HOUR.     CLEAR LIQUID DIET                                                                    water Black Coffee and tea, regular and decaf No Creamer                            Plain Jell-O any favor except red or purple                                  Fruit ices (not with fruit pulp)  Iced Popsicles                                                                      Cranberry, grape and apple juices Sports drinks like Gatorade Lightly seasoned clear broth or consume(fat free) Sugar, honey syrup  Sample Menu Breakfast                                Lunch                                     Supper Cranberry juice                    Beef broth                            Chicken broth Jell-O                                     Grape juice                           Apple juice Coffee or tea                        Jell-O                                      Popsicle                                                Coffee or tea                        Coffee or tea  _____________________________________________________________________          BRUSH YOUR TEETH MORNING OF SURGERY AND RINSE YOUR MOUTH OUT, NO CHEWING GUM CANDY OR MINTS.     Take these medicines the morning of surgery with A SIP OF WATER: Lamotrigne, cymbalta, celexa, zyrtec, lipitor  DO NOT TAKE ANY DIABETIC MEDICATIONS DAY OF YOUR SURGERY  Day before surgery only take morning/lunch dose of glipizide                              You may not have any metal on your body including hair pins and              piercings  Do not wear jewelry, make-up, lotions, powders,perfumes,        deodorant             Do not wear nail polish on your fingernails or toenails .  Do not shave  48 hours prior to surgery.  Do not bring valuables to the hospital. [facility] IS NOT             RESPONSIBLE   FOR VALUABLES.  Contacts, dentures or bridgework may not be worn into surgery.  You may bring a small overnight bag with you     Patients discharged the day of surgery will not be allowed to drive home. IF YOU ARE HAVING SURGERY AND GOING HOME THE SAME DAY, YOU MUST HAVE AN ADULT TO DRIVE YOU HOME AND BE WITH YOU FOR 24 HOURS. YOU MAY GO HOME BY TAXI OR UBER OR ORTHERWISE, BUT AN ADULT MUST ACCOMPANY YOU HOME AND STAY WITH YOU FOR 24 HOURS.  Name and phone number of your driver:  Special Instructions: N/A              Please read over the following fact sheets you were given: _____________________________________________________________________             [facility] - Preparing for Surgery Before surgery, you can play an important role.  Because skin is not sterile, your skin needs to be as free of germs as possible.  You can reduce the number of germs on your skin by washing with CHG (chlorahexidine gluconate) soap before surgery.  CHG is an antiseptic cleaner which kills germs and bonds with the skin to continue killing germs even after washing. Please DO NOT use if you have an allergy to CHG or antibacterial soaps.  If your skin becomes reddened/irritated stop using the CHG and inform your nurse when you arrive at Short Stay. Do not shave (including legs and underarms) for at least 48 hours prior to the first CHG shower.  You may shave your face/neck. Please follow these instructions carefully:  1.  Shower with CHG Soap the night before surgery and the  morning of Surgery.  2.  If you choose to wash your hair, wash your hair first as usual with your  normal  shampoo.  3.  After you shampoo, rinse your hair and body thoroughly to remove the  shampoo.                           4.  Use CHG as you would any other liquid soap.  You can apply chg directly  to the skin and wash                        Gently with a scrungie or clean washcloth.  5.  Apply the CHG Soap to your body ONLY FROM THE NECK DOWN.   Do not use on face/ open                           Wound or open sores. Avoid contact with eyes, ears mouth and genitals (private parts).                       Wash face,  Genitals (private parts) with your normal soap.             6.  Wash thoroughly, paying special attention to the area where your surgery  will be performed.  7.  Thoroughly rinse your body with warm water from the neck down.  8.  DO NOT shower/wash with your normal soap after using and rinsing off  the CHG Soap.  9.  Pat yourself dry with a clean towel.            10.  Wear clean pajamas.            11.  Place clean sheets on your bed the night of your first shower and do not  sleep with pets. Day of Surgery : Do not apply any lotions/deodorants the morning of surgery.  Please wear clean clothes to the hospital/surgery center.  FAILURE TO FOLLOW THESE INSTRUCTIONS MAY RESULT IN THE CANCELLATION OF YOUR SURGERY PATIENT SIGNATURE_________________________________  NURSE SIGNATURE__________________________________  ________________________________________________________________________    Brandy [name]  An incentive spirometer is a tool that can help keep your lungs clear and active. This tool measures how well you are filling your lungs with each breath. Taking long deep breaths may help reverse or decrease the chance of developing breathing (pulmonary) problems (especially infection) following: A long period of time when you are unable to move or be active. BEFORE THE PROCEDURE  If the spirometer includes an indicator to show your best effort, your nurse or respiratory therapist will set it to a desired goal. If possible, sit up straight or lean slightly forward. Try not to slouch. Hold the incentive spirometer in an upright position. INSTRUCTIONS FOR USE  Sit on the edge of your bed if  possible, or sit up as far as you can in bed or on a chair. Hold the incentive spirometer in an upright position. Breathe out normally. Place the mouthpiece in your mouth and seal your lips tightly around it. Breathe in slowly and as deeply as possible, raising the piston or the ball toward the top of the column. Hold your breath for 3-5 seconds or for as long as possible. Allow the piston or ball to fall to the bottom of the column. Remove the mouthpiece from your mouth and breathe out normally. Rest for a few seconds and repeat Steps 1 through 7 at least 10 times every 1-2 hours when you are awake. Take your time and take a few normal breaths between deep breaths. The spirometer may include an indicator to show your best effort. Use the indicator as a goal to work toward during each repetition. After each set of 10 deep breaths, practice coughing to be sure your lungs are clear. If you have an incision (the cut made at the time of surgery), support your incision when coughing by placing a pillow or rolled up towels firmly against it. Once you are able to get out of bed, walk around indoors and cough well. You may stop using the incentive spirometer when instructed by your caregiver.  RISKS AND COMPLICATIONS Take your time so you do not get dizzy or light-headed. If you are in pain, you may need to take or ask for pain medication before doing incentive spirometry. It is harder to take a deep breath if you are having pain. AFTER USE Rest and breathe slowly and easily. It can be helpful to keep track of a log of your progress. Your caregiver can provide you with a simple table to help with this. If you are using the spirometer at home, follow these instructions: SEEK MEDICAL CARE IF:  You are having difficultly using the spirometer. You have trouble using the spirometer as often as instructed. Your pain medication is not giving enough relief while using the spirometer. You develop fever of  100.5 F (38.1 C) or higher. SEEK IMMEDIATE MEDICAL CARE IF:  You cough up bloody sputum that  had not been present before. You develop fever of 102 F (38.9 C) or greater. You develop worsening pain at or near the incision site. MAKE SURE YOU:  Understand these instructions. Will watch your condition. Will get help right away if you are not doing well or get worse. Document Released: 08/27/2006 Document Revised: 07/09/2011 Document Reviewed: 10/28/2006 ExitCare Patient Information 2014 ExitCare, [place].   ________________________________________________________________________ How to Manage Your Diabetes Before and After Surgery  Why is it important to control my blood sugar before and after surgery? Improving blood sugar levels before and after surgery helps healing and can limit problems. A way of improving blood sugar control is eating a healthy diet by:  Eating less sugar and carbohydrates  Increasing activity/exercise  Talking with your doctor about reaching your blood sugar goals High blood sugars (greater than 180 mg/dL) can raise your risk of infections and slow your recovery, so you will need to focus on controlling your diabetes during the weeks before surgery. Make sure that the doctor who takes care of your diabetes knows about your planned surgery including the date and location.  How do I manage my blood sugar before surgery? Check your blood sugar at least 4 times a day, starting 2 days before surgery, to make sure that the level is not too high or low. Check your blood sugar the morning of your surgery when you wake up and every 2 hours until you get to the Short Stay unit. If your blood sugar is less than 70 mg/dL, you will need to treat for low blood sugar: Do not take insulin. Treat a low blood sugar (less than 70 mg/dL) with  cup of clear juice (cranberry or apple), 4 glucose tablets, OR glucose gel. Recheck blood sugar in 15 minutes after treatment (to make sure  it is greater than 70 mg/dL). If your blood sugar is not greater than 70 mg/dL on recheck, call [phone] for further instructions. Report your blood sugar to the short stay nurse when you get to Short Stay.  If you are admitted to the hospital after surgery: Your blood sugar will be checked by the staff and you will probably be given insulin after surgery (instead of oral diabetes medicines) to make sure you have good blood sugar levels. The goal for blood sugar control after surgery is 80-180 mg/dL.   WHAT DO I DO ABOUT MY DIABETES MEDICATION?  Do not take oral diabetes medicines (pills) the morning of surgery.

## 2021-03-16 NOTE — Progress Notes (Addendum)
PCP - Vernona Rieger, PA Cardiologist - LOV/clearance Cadence Lorna Few  02-15-21 epic  PPM/ICD -  Device Orders -  Rep Notified -   Chest x-ray - 01-04-21  EKG - 01-05-21 epic Stress Test -  ECHO - 02-08-21 Cardiac Cath -   Sleep Study -  CPAP -   Fasting Blood Sugar - 130-150 Checks Blood Sugar __2-3___ times a week Blood Thinner Instructions: Aspirin Instructions:  ERAS Protcol - PRE-SURGERY G2-   COVID TEST- 03-31-21 COVID vaccine -yes pfizer   Activity--Able to walk stairs with some SOB not knew for patient Anesthesia review: DM II, HTN, BP and Blood sugar elevated at preop pt. Stated she has not had DM medicine and has not had her BP medicine in 2 days. Pt. Advised to take medication as prescribed and that her BP and cbg  will be revaluated DOS  Patient denies shortness of breath, fever, cough and chest pain at PAT appointment   All instructions explained to the patient, with a verbal understanding of the material. Patient agrees to go over the instructions while at home for a better understanding. Patient also instructed to self quarantine after being tested for COVID-19. The opportunity to ask questions was provided.

## 2021-03-17 ENCOUNTER — Encounter: Payer: BC Managed Care – PPO | Attending: General Surgery

## 2021-03-17 ENCOUNTER — Other Ambulatory Visit: Payer: Self-pay

## 2021-03-17 ENCOUNTER — Ambulatory Visit: Payer: BC Managed Care – PPO | Admitting: Dietician

## 2021-03-17 VITALS — Ht 66.0 in | Wt 351.2 lb

## 2021-03-17 DIAGNOSIS — E1165 Type 2 diabetes mellitus with hyperglycemia: Secondary | ICD-10-CM | POA: Insufficient documentation

## 2021-03-17 DIAGNOSIS — Z6841 Body Mass Index (BMI) 40.0 and over, adult: Secondary | ICD-10-CM | POA: Diagnosis not present

## 2021-03-17 NOTE — Progress Notes (Signed)
Pre-Operative Nutrition Class:  Appt start time: 0900   End time:  1100  Patient was seen on 03/17/21 for Pre-Operative Bariatric Surgery Education at [facility] at [facility].   Surgery date: 04/04/21 Surgery type: RnY Gastric Bypass Start weight at [facility]: 365.2lbs Weight today: 351.2lbs  InBody  BODY COMP RESULTS  03/17/21  BMI (kg/m^2) 56.7  Skeletal Muscle Mass (lbs) 82.5  % Body Fat 58.2   Samples given per MNT protocol. Patient educated on appropriate usage:   Lot number Expiration date  Celebrate Vitamins RNY pack:  Calcium soft chews salt caramel choc   [id number]   07/2021  Calcium soft chews watermelon 2111 01/2022  Calcium soft chews straw-ban cream 2122 02/2022  Calcium soft chews caf mocha 2117 01/2022  Calcium soft chews pb chocolate [id number] 02/2022  Calcium soft chews orange 2180 03/2022  Calcium soft chews caramel 2055 11/2021  Calcium soft chews chocolate 2082 01/2022  Calcium soft chew rasp lemonade [id number] 01/2022  Calcium soft chew lemon cream 2159 03/2022    Calcium (tablet) 500 cherry tart [phone] 09/2021  B12 Quick-melt Cherry   [phone] 02/2022       Iron soft chews 8m cherry burst [id number]/2024  Iron soft chews 664mtwisted citrus 2218403F54/2024  Iron 45 tab grape [phone] 05/2022      Multivitamin soft chews very cherry 21238A6 05/2021  Multivitamin soft chews strawberry 21221A6 05/2021  Multivitamin soft chews green apple 21239A6 05/2021  Multivitamin tab pineapple strawberry 10436-06775/2023  Multivitamin complete 45 0370I1 11/2021             Unjury products:          Unflavored powder (indiv) [id number] 09/2021  Chocolate powder    Chicken soup powder (indiv) [id number] 09/2021     The following the learning objectives were met by the patient during this course: Identify Pre-Op Dietary Goals and will begin 2 weeks pre-operatively Identify appropriate sources of fluids and proteins   State protein recommendations and appropriate sources pre and post-operatively Identify Post-Operative Dietary Goals and will follow for 2 weeks post-operatively Identify appropriate multivitamin and calcium sources Describe the need for physical activity post-operatively and will follow MD recommendations State when to call healthcare provider regarding medication questions or post-operative complications  Handouts given during class include: Pre-Op Bariatric Surgery Diet Handout Protein Shake Handout Post-Op Bariatric Surgery Nutrition Handout BELT Program Information Flyer Support Group Information Flyer [facility] Bariatric Supplements Price List  Follow-Up Plan: [facility] [place] [facility] [facility] post operatively for diet advancement per MD.

## 2021-03-20 ENCOUNTER — Encounter (HOSPITAL_COMMUNITY): Payer: Self-pay

## 2021-03-20 ENCOUNTER — Encounter (HOSPITAL_COMMUNITY)
Admission: RE | Admit: 2021-03-20 | Discharge: 2021-03-20 | Disposition: A | Discharge status: Home / Self Care | Payer: BC Managed Care – PPO | Source: Ambulatory Visit | Attending: General Surgery | Admitting: General Surgery

## 2021-03-20 ENCOUNTER — Other Ambulatory Visit: Payer: Self-pay

## 2021-03-20 VITALS — HR 102 | Temp 98.2°F | Resp 16 | Ht 66.0 in | Wt 349.0 lb

## 2021-03-20 DIAGNOSIS — E119 Type 2 diabetes mellitus without complications: Secondary | ICD-10-CM | POA: Insufficient documentation

## 2021-03-20 DIAGNOSIS — Z6841 Body Mass Index (BMI) 40.0 and over, adult: Secondary | ICD-10-CM | POA: Diagnosis not present

## 2021-03-20 DIAGNOSIS — Z01812 Encounter for preprocedural laboratory examination: Secondary | ICD-10-CM | POA: Insufficient documentation

## 2021-03-20 DIAGNOSIS — Z01818 Encounter for other preprocedural examination: Secondary | ICD-10-CM

## 2021-03-20 LAB — COMPREHENSIVE METABOLIC PANEL
ALT: 15 U/L (ref 0–44)
AST: 17 U/L (ref 15–41)
Albumin: 4 g/dL (ref 3.5–5.0)
Alkaline Phosphatase: 115 U/L (ref 38–126)
Anion gap: 9 (ref 5–15)
BUN: 14 mg/dL (ref 6–20)
CO2: 25 mmol/L (ref 22–32)
Calcium: 8.8 mg/dL — ABNORMAL LOW (ref 8.9–10.3)
Chloride: 98 mmol/L (ref 98–111)
Creatinine, Ser: 0.74 mg/dL (ref 0.44–1.00)
GFR, Estimated: >60 mL/min (ref >60)
Glucose, Bld: 286 mg/dL — ABNORMAL HIGH (ref 70–99)
Potassium: 4.7 mmol/L (ref 3.5–5.1)
Sodium: 132 mmol/L — ABNORMAL LOW (ref 135–145)
Total Bilirubin: 0.6 mg/dL (ref 0.3–1.2)
Total Protein: 7.4 g/dL (ref 6.5–8.1)

## 2021-03-20 LAB — CBC WITH DIFFERENTIAL/PLATELET
Abs Immature Granulocytes: 0.03 10*3/uL (ref 0.00–0.07)
Basophils Absolute: 0.1 10*3/uL (ref 0.0–0.1)
Basophils Relative: 1 %
Eosinophils Absolute: 0.2 10*3/uL (ref 0.0–0.5)
Eosinophils Relative: 2 %
HCT: 40.9 % (ref 36.0–46.0)
Hemoglobin: 13.6 g/dL (ref 12.0–15.0)
Immature Granulocytes: 0 %
Lymphocytes Relative: 19 %
Lymphs Abs: 1.7 10*3/uL (ref 0.7–4.0)
MCH: 30.6 pg (ref 26.0–34.0)
MCHC: 33.3 g/dL (ref 30.0–36.0)
MCV: 91.9 fL (ref 80.0–100.0)
Monocytes Absolute: 0.5 10*3/uL (ref 0.1–1.0)
Monocytes Relative: 6 %
Neutro Abs: 6.4 10*3/uL (ref 1.7–7.7)
Neutrophils Relative %: 72 %
Platelets: 339 10*3/uL (ref 150–400)
RBC: 4.45 MIL/uL (ref 3.87–5.11)
RDW: 12.5 % (ref 11.5–15.5)
WBC: 8.8 10*3/uL (ref 4.0–10.5)
nRBC: 0 % (ref 0.0–0.2)

## 2021-03-20 LAB — GLUCOSE, CAPILLARY: Glucose-Capillary: 297 mg/dL — ABNORMAL HIGH (ref 70–99)

## 2021-03-20 LAB — HEMOGLOBIN A1C
Hgb A1c MFr Bld: 7.9 % — ABNORMAL HIGH (ref 4.8–5.6)
Mean Plasma Glucose: 180.03 mg/dL

## 2021-03-22 DIAGNOSIS — E119 Type 2 diabetes mellitus without complications: Secondary | ICD-10-CM | POA: Diagnosis not present

## 2021-03-22 DIAGNOSIS — I10 Essential (primary) hypertension: Secondary | ICD-10-CM | POA: Diagnosis not present

## 2021-03-22 DIAGNOSIS — G4733 Obstructive sleep apnea (adult) (pediatric): Secondary | ICD-10-CM | POA: Diagnosis not present

## 2021-03-30 ENCOUNTER — Ambulatory Visit: Payer: BC Managed Care – PPO | Admitting: Psychology

## 2021-04-03 ENCOUNTER — Encounter (HOSPITAL_COMMUNITY): Payer: Self-pay | Admitting: General Surgery

## 2021-04-04 ENCOUNTER — Inpatient Hospital Stay (HOSPITAL_COMMUNITY): Payer: BC Managed Care – PPO | Admitting: Certified Registered"

## 2021-04-04 ENCOUNTER — Inpatient Hospital Stay (HOSPITAL_COMMUNITY)
Admission: RE | Admit: 2021-04-04 | Discharge: 2021-04-05 | DRG: 621 | Disposition: A | Discharge status: Home / Self Care | Payer: BC Managed Care – PPO | Attending: General Surgery | Admitting: General Surgery

## 2021-04-04 ENCOUNTER — Encounter (HOSPITAL_COMMUNITY)
Admission: RE | Disposition: A | Discharge status: Home / Self Care | Payer: Self-pay | Source: Home / Self Care | Attending: General Surgery

## 2021-04-04 ENCOUNTER — Encounter (HOSPITAL_COMMUNITY): Payer: Self-pay | Admitting: General Surgery

## 2021-04-04 DIAGNOSIS — M1990 Unspecified osteoarthritis, unspecified site: Secondary | ICD-10-CM | POA: Diagnosis not present

## 2021-04-04 DIAGNOSIS — Z01818 Encounter for other preprocedural examination: Principal | ICD-10-CM

## 2021-04-04 DIAGNOSIS — E559 Vitamin D deficiency, unspecified: Secondary | ICD-10-CM | POA: Diagnosis not present

## 2021-04-04 DIAGNOSIS — Z7984 Long term (current) use of oral hypoglycemic drugs: Secondary | ICD-10-CM

## 2021-04-04 DIAGNOSIS — I10 Essential (primary) hypertension: Secondary | ICD-10-CM | POA: Diagnosis present

## 2021-04-04 DIAGNOSIS — Z8249 Family history of ischemic heart disease and other diseases of the circulatory system: Secondary | ICD-10-CM | POA: Diagnosis not present

## 2021-04-04 DIAGNOSIS — Z6841 Body Mass Index (BMI) 40.0 and over, adult: Secondary | ICD-10-CM

## 2021-04-04 DIAGNOSIS — F419 Anxiety disorder, unspecified: Secondary | ICD-10-CM | POA: Diagnosis present

## 2021-04-04 DIAGNOSIS — E119 Type 2 diabetes mellitus without complications: Secondary | ICD-10-CM | POA: Diagnosis not present

## 2021-04-04 DIAGNOSIS — F32A Depression, unspecified: Secondary | ICD-10-CM | POA: Diagnosis present

## 2021-04-04 DIAGNOSIS — R5382 Chronic fatigue, unspecified: Secondary | ICD-10-CM | POA: Diagnosis present

## 2021-04-04 DIAGNOSIS — Z20822 Contact with and (suspected) exposure to covid-19: Secondary | ICD-10-CM | POA: Diagnosis not present

## 2021-04-04 DIAGNOSIS — Z808 Family history of malignant neoplasm of other organs or systems: Secondary | ICD-10-CM

## 2021-04-04 DIAGNOSIS — G4733 Obstructive sleep apnea (adult) (pediatric): Secondary | ICD-10-CM | POA: Diagnosis not present

## 2021-04-04 DIAGNOSIS — E781 Pure hyperglyceridemia: Secondary | ICD-10-CM | POA: Diagnosis not present

## 2021-04-04 DIAGNOSIS — I5031 Acute diastolic (congestive) heart failure: Secondary | ICD-10-CM | POA: Diagnosis not present

## 2021-04-04 DIAGNOSIS — I110 Hypertensive heart disease with heart failure: Secondary | ICD-10-CM | POA: Diagnosis not present

## 2021-04-04 HISTORY — PX: GASTRIC ROUX-EN-Y: SHX5262

## 2021-04-04 LAB — GLUCOSE, CAPILLARY
Glucose-Capillary: 162 mg/dL — ABNORMAL HIGH (ref 70–99)
Glucose-Capillary: 201 mg/dL — ABNORMAL HIGH (ref 70–99)
Glucose-Capillary: 262 mg/dL — ABNORMAL HIGH (ref 70–99)
Glucose-Capillary: 229 mg/dL — ABNORMAL HIGH (ref 70–99)

## 2021-04-04 LAB — HEMOGLOBIN AND HEMATOCRIT, BLOOD
HCT: 37.1 % (ref 36.0–46.0)
Hemoglobin: 12.8 g/dL (ref 12.0–15.0)

## 2021-04-04 LAB — SARS CORONAVIRUS 2 BY RT PCR (HOSPITAL ORDER, PERFORMED IN ~~LOC~~ HOSPITAL LAB): SARS Coronavirus 2: NEGATIVE

## 2021-04-04 LAB — TYPE AND SCREEN
ABO/RH(D): O POS
Antibody Screen: NEGATIVE

## 2021-04-04 LAB — ABO/RH: ABO/RH(D): O POS

## 2021-04-04 SURGERY — LAPAROSCOPIC ROUX-EN-Y GASTRIC BYPASS WITH UPPER ENDOSCOPY
Anesthesia: General

## 2021-04-04 MED ORDER — DEXAMETHASONE SODIUM PHOSPHATE 4 MG/ML IJ SOLN: 4.0000 mg | INTRAMUSCULAR | Status: DC

## 2021-04-04 MED ORDER — SCOPOLAMINE 1 MG/3DAYS TD PT72
MEDICATED_PATCH | TRANSDERMAL | Status: AC
  Filled 2021-04-04: qty 1

## 2021-04-04 MED ORDER — ONDANSETRON HCL 4 MG/2ML IJ SOLN: 4.0000 mg | INTRAMUSCULAR | Status: DC | PRN

## 2021-04-04 MED ORDER — SUCCINYLCHOLINE CHLORIDE 200 MG/10ML IV SOSY
PREFILLED_SYRINGE | INTRAVENOUS | Status: DC | PRN
  Administered 2021-04-04: 140 mg via INTRAVENOUS

## 2021-04-04 MED ORDER — ACETAMINOPHEN 160 MG/5ML PO SOLN: 1000.0000 mg | Freq: Three times a day (TID) | ORAL | Status: DC

## 2021-04-04 MED ORDER — FENTANYL CITRATE (PF) 250 MCG/5ML IJ SOLN
INTRAMUSCULAR | Status: DC | PRN
  Administered 2021-04-04 (×4): 25 ug via INTRAVENOUS
  Administered 2021-04-04: 50 ug via INTRAVENOUS
  Administered 2021-04-04 (×2): 25 ug via INTRAVENOUS

## 2021-04-04 MED ORDER — LACTATED RINGERS IV SOLN: INTRAVENOUS | Status: DC

## 2021-04-04 MED ORDER — MORPHINE SULFATE (PF) 4 MG/ML IV SOLN
INTRAVENOUS | Status: AC
  Filled 2021-04-04: qty 1

## 2021-04-04 MED ORDER — ROCURONIUM BROMIDE 10 MG/ML (PF) SYRINGE
PREFILLED_SYRINGE | INTRAVENOUS | Status: DC | PRN
  Administered 2021-04-04: 60 mg via INTRAVENOUS
  Administered 2021-04-04: 10 mg via INTRAVENOUS

## 2021-04-04 MED ORDER — FENTANYL CITRATE (PF) 250 MCG/5ML IJ SOLN
INTRAMUSCULAR | Status: AC
  Filled 2021-04-04: qty 5

## 2021-04-04 MED ORDER — LIDOCAINE HCL (PF) 2 % IJ SOLN
INTRAMUSCULAR | Status: AC
  Filled 2021-04-04: qty 5

## 2021-04-04 MED ORDER — SODIUM CHLORIDE 0.9 % IR SOLN
Status: DC | PRN
  Administered 2021-04-04: 500 mL
  Administered 2021-04-04: 1000 mL

## 2021-04-04 MED ORDER — BUPIVACAINE LIPOSOME 1.3 % IJ SUSP: 20.0000 mL | Freq: Once | INTRAMUSCULAR | Status: DC

## 2021-04-04 MED ORDER — APREPITANT 40 MG PO CAPS
40.0000 mg | ORAL_CAPSULE | ORAL | Status: AC
  Administered 2021-04-04: 40 mg via ORAL

## 2021-04-04 MED ORDER — SUCCINYLCHOLINE CHLORIDE 200 MG/10ML IV SOSY
PREFILLED_SYRINGE | INTRAVENOUS | Status: AC
  Filled 2021-04-04: qty 10

## 2021-04-04 MED ORDER — BUPIVACAINE LIPOSOME 1.3 % IJ SUSP
INTRAMUSCULAR | Status: DC | PRN
  Administered 2021-04-04: 20 mL

## 2021-04-04 MED ORDER — ROCURONIUM BROMIDE 10 MG/ML (PF) SYRINGE
PREFILLED_SYRINGE | INTRAVENOUS | Status: AC
  Filled 2021-04-04: qty 10

## 2021-04-04 MED ORDER — ONDANSETRON HCL 4 MG/2ML IJ SOLN
INTRAMUSCULAR | Status: DC | PRN
  Administered 2021-04-04: 4 mg via INTRAVENOUS

## 2021-04-04 MED ORDER — OXYCODONE HCL 5 MG/5ML PO SOLN
ORAL | Status: AC
  Filled 2021-04-04: qty 5

## 2021-04-04 MED ORDER — SIMETHICONE 80 MG PO CHEW: 80.0000 mg | CHEWABLE_TABLET | Freq: Four times a day (QID) | ORAL | Status: DC | PRN

## 2021-04-04 MED ORDER — GABAPENTIN 300 MG PO CAPS
300.0000 mg | ORAL_CAPSULE | Freq: Three times a day (TID) | ORAL | Status: DC
  Administered 2021-04-04 – 2021-04-05 (×4): 300 mg via ORAL
  Filled 2021-04-04 (×4): qty 1

## 2021-04-04 MED ORDER — EPHEDRINE 5 MG/ML INJ
INTRAVENOUS | Status: AC
  Filled 2021-04-04: qty 5

## 2021-04-04 MED ORDER — OXYCODONE HCL 5 MG/5ML PO SOLN: 5.0000 mg | Freq: Once | ORAL | Status: DC | PRN

## 2021-04-04 MED ORDER — SUGAMMADEX SODIUM 500 MG/5ML IV SOLN
INTRAVENOUS | Status: AC
  Filled 2021-04-04: qty 5

## 2021-04-04 MED ORDER — ACETAMINOPHEN 500 MG PO TABS
1000.0000 mg | ORAL_TABLET | Freq: Three times a day (TID) | ORAL | Status: DC
  Administered 2021-04-04 – 2021-04-05 (×4): 1000 mg via ORAL
  Filled 2021-04-04 (×4): qty 2

## 2021-04-04 MED ORDER — FAMOTIDINE IN NACL 20-0.9 MG/50ML-% IV SOLN
20.0000 mg | Freq: Two times a day (BID) | INTRAVENOUS | Status: DC
  Administered 2021-04-04 (×2): 20 mg via INTRAVENOUS
  Filled 2021-04-04 (×2): qty 50

## 2021-04-04 MED ORDER — MIDAZOLAM HCL 2 MG/2ML IJ SOLN
INTRAMUSCULAR | Status: DC | PRN
  Administered 2021-04-04: 1 mg via INTRAVENOUS

## 2021-04-04 MED ORDER — DEXAMETHASONE SODIUM PHOSPHATE 10 MG/ML IJ SOLN
INTRAMUSCULAR | Status: DC | PRN
  Administered 2021-04-04: 4 mg via INTRAVENOUS

## 2021-04-04 MED ORDER — SODIUM CHLORIDE 0.9 % IV SOLN
2.0000 g | INTRAVENOUS | Status: AC
  Administered 2021-04-04: 2 g via INTRAVENOUS

## 2021-04-04 MED ORDER — HEPARIN SODIUM (PORCINE) 5000 UNIT/ML IJ SOLN
INTRAMUSCULAR | Status: AC
  Filled 2021-04-04: qty 1

## 2021-04-04 MED ORDER — CHLORHEXIDINE GLUCONATE 0.12 % MT SOLN
15.0000 mL | Freq: Once | OROMUCOSAL | Status: AC
  Administered 2021-04-04: 15 mL via OROMUCOSAL

## 2021-04-04 MED ORDER — BUPIVACAINE-EPINEPHRINE (PF) 0.25% -1:200000 IJ SOLN
INTRAMUSCULAR | Status: DC | PRN
  Administered 2021-04-04: 30 mL

## 2021-04-04 MED ORDER — HEPARIN SODIUM (PORCINE) 5000 UNIT/ML IJ SOLN
5000.0000 [IU] | INTRAMUSCULAR | Status: AC
  Administered 2021-04-04: 5000 [IU] via SUBCUTANEOUS

## 2021-04-04 MED ORDER — LIDOCAINE HCL 2 % IJ SOLN
INTRAMUSCULAR | Status: AC
  Filled 2021-04-04: qty 20

## 2021-04-04 MED ORDER — LAMOTRIGINE 100 MG PO TABS
100.0000 mg | ORAL_TABLET | Freq: Every evening | ORAL | Status: DC
  Administered 2021-04-04 – 2021-04-05 (×2): 100 mg via ORAL
  Filled 2021-04-04 (×2): qty 1

## 2021-04-04 MED ORDER — OXYCODONE HCL 5 MG PO TABS: 5.0000 mg | ORAL_TABLET | Freq: Once | ORAL | Status: DC | PRN

## 2021-04-04 MED ORDER — DULOXETINE HCL 60 MG PO CPEP
90.0000 mg | ORAL_CAPSULE | Freq: Every day | ORAL | Status: DC
  Administered 2021-04-05: 90 mg via ORAL
  Filled 2021-04-04: qty 1

## 2021-04-04 MED ORDER — LIDOCAINE 20MG/ML (2%) 15 ML SYRINGE OPTIME
INTRAMUSCULAR | Status: DC | PRN
  Administered 2021-04-04: 1.5 mg/kg/h via INTRAVENOUS

## 2021-04-04 MED ORDER — PROPOFOL 10 MG/ML IV BOLUS
INTRAVENOUS | Status: DC | PRN
  Administered 2021-04-04: 180 mg via INTRAVENOUS

## 2021-04-04 MED ORDER — BUPIVACAINE LIPOSOME 1.3 % IJ SUSP
INTRAMUSCULAR | Status: AC
  Filled 2021-04-04: qty 20

## 2021-04-04 MED ORDER — ALBUTEROL SULFATE HFA 108 (90 BASE) MCG/ACT IN AERS
INHALATION_SPRAY | RESPIRATORY_TRACT | Status: AC
  Filled 2021-04-04: qty 6.7

## 2021-04-04 MED ORDER — ORAL CARE MOUTH RINSE: 15.0000 mL | Freq: Once | OROMUCOSAL | Status: AC

## 2021-04-04 MED ORDER — PHENYLEPHRINE 40 MCG/ML (10ML) SYRINGE FOR IV PUSH (FOR BLOOD PRESSURE SUPPORT)
PREFILLED_SYRINGE | INTRAVENOUS | Status: AC
  Filled 2021-04-04: qty 10

## 2021-04-04 MED ORDER — PROPOFOL 10 MG/ML IV BOLUS
INTRAVENOUS | Status: AC
  Filled 2021-04-04: qty 20

## 2021-04-04 MED ORDER — CITALOPRAM HYDROBROMIDE 40 MG PO TABS
40.0000 mg | ORAL_TABLET | Freq: Every day | ORAL | Status: DC
  Administered 2021-04-05: 40 mg via ORAL
  Filled 2021-04-04: qty 2
  Filled 2021-04-04: qty 1

## 2021-04-04 MED ORDER — HYDRALAZINE HCL 10 MG PO TABS
10.0000 mg | ORAL_TABLET | Freq: Three times a day (TID) | ORAL | Status: DC | PRN
  Filled 2021-04-04: qty 1

## 2021-04-04 MED ORDER — PHENYLEPHRINE HCL-NACL 20-0.9 MG/250ML-% IV SOLN: INTRAVENOUS | Status: DC | PRN

## 2021-04-04 MED ORDER — SODIUM CHLORIDE 0.9 % IV SOLN
INTRAVENOUS | Status: AC
  Filled 2021-04-04: qty 2

## 2021-04-04 MED ORDER — APREPITANT 40 MG PO CAPS
ORAL_CAPSULE | ORAL | Status: AC
  Filled 2021-04-04: qty 1

## 2021-04-04 MED ORDER — MIDAZOLAM HCL 2 MG/2ML IJ SOLN
INTRAMUSCULAR | Status: AC
  Filled 2021-04-04: qty 2

## 2021-04-04 MED ORDER — LORATADINE 10 MG PO TABS
10.0000 mg | ORAL_TABLET | Freq: Every day | ORAL | Status: DC
  Administered 2021-04-05: 10 mg via ORAL
  Filled 2021-04-04: qty 1

## 2021-04-04 MED ORDER — OXYCODONE HCL 5 MG/5ML PO SOLN
5.0000 mg | Freq: Four times a day (QID) | ORAL | Status: DC | PRN
  Administered 2021-04-04: 5 mg via ORAL

## 2021-04-04 MED ORDER — FENTANYL CITRATE PF 50 MCG/ML IJ SOSY: 25.0000 ug | PREFILLED_SYRINGE | INTRAMUSCULAR | Status: DC | PRN

## 2021-04-04 MED ORDER — DEXAMETHASONE SODIUM PHOSPHATE 10 MG/ML IJ SOLN
INTRAMUSCULAR | Status: AC
  Filled 2021-04-04: qty 1

## 2021-04-04 MED ORDER — LAMOTRIGINE 100 MG PO TABS
200.0000 mg | ORAL_TABLET | Freq: Every day | ORAL | Status: DC
  Administered 2021-04-05: 200 mg via ORAL
  Filled 2021-04-04: qty 2

## 2021-04-04 MED ORDER — LACTATED RINGERS IR SOLN
Status: DC | PRN
  Administered 2021-04-04: 1000 mL

## 2021-04-04 MED ORDER — EPHEDRINE SULFATE-NACL 50-0.9 MG/10ML-% IV SOSY
PREFILLED_SYRINGE | INTRAVENOUS | Status: DC | PRN
  Administered 2021-04-04: 15 mg via INTRAVENOUS
  Administered 2021-04-04: 10 mg via INTRAVENOUS
  Administered 2021-04-04: 5 mg via INTRAVENOUS
  Administered 2021-04-04: 10 mg via INTRAVENOUS
  Administered 2021-04-04 (×2): 5 mg via INTRAVENOUS

## 2021-04-04 MED ORDER — ENSURE MAX PROTEIN PO LIQD
2.0000 [oz_av] | ORAL | Status: DC
  Administered 2021-04-05 (×2): 2 [oz_av] via ORAL

## 2021-04-04 MED ORDER — LIDOCAINE 2% (20 MG/ML) 5 ML SYRINGE
INTRAMUSCULAR | Status: DC | PRN
Start: 2021-04-04 — End: 2021-04-04
  Administered 2021-04-04: 60 mg via INTRAVENOUS

## 2021-04-04 MED ORDER — PHENYLEPHRINE HCL (PRESSORS) 10 MG/ML IV SOLN
INTRAVENOUS | Status: AC
  Filled 2021-04-04: qty 1

## 2021-04-04 MED ORDER — ONDANSETRON HCL 4 MG/2ML IJ SOLN
INTRAMUSCULAR | Status: AC
  Filled 2021-04-04: qty 2

## 2021-04-04 MED ORDER — ACETAMINOPHEN 500 MG PO TABS
ORAL_TABLET | ORAL | Status: AC
  Filled 2021-04-04: qty 2

## 2021-04-04 MED ORDER — PHENYLEPHRINE HCL-NACL 20-0.9 MG/250ML-% IV SOLN
INTRAVENOUS | Status: DC | PRN
  Administered 2021-04-04: 25 ug/min via INTRAVENOUS

## 2021-04-04 MED ORDER — PROMETHAZINE HCL 25 MG/ML IJ SOLN: 6.2500 mg | INTRAMUSCULAR | Status: DC | PRN

## 2021-04-04 MED ORDER — MORPHINE SULFATE (PF) 2 MG/ML IV SOLN
INTRAVENOUS | Status: AC
  Filled 2021-04-04: qty 1

## 2021-04-04 MED ORDER — CHLORHEXIDINE GLUCONATE CLOTH 2 % EX PADS: 6.0000 | MEDICATED_PAD | Freq: Once | CUTANEOUS | Status: DC

## 2021-04-04 MED ORDER — MORPHINE SULFATE (PF) 2 MG/ML IV SOLN
1.0000 mg | INTRAVENOUS | Status: DC | PRN
  Administered 2021-04-04: 1 mg via INTRAVENOUS

## 2021-04-04 MED ORDER — BUPIVACAINE-EPINEPHRINE (PF) 0.25% -1:200000 IJ SOLN
INTRAMUSCULAR | Status: AC
  Filled 2021-04-04: qty 30

## 2021-04-04 MED ORDER — TRAZODONE HCL 50 MG PO TABS: 50.0000 mg | ORAL_TABLET | Freq: Every evening | ORAL | Status: DC | PRN

## 2021-04-04 MED ORDER — KETAMINE HCL 10 MG/ML IJ SOLN
INTRAMUSCULAR | Status: AC
  Filled 2021-04-04: qty 1

## 2021-04-04 MED ORDER — DEXTROSE-NACL 5-0.45 % IV SOLN: INTRAVENOUS | Status: DC

## 2021-04-04 MED ORDER — ACETAMINOPHEN 500 MG PO TABS
1000.0000 mg | ORAL_TABLET | ORAL | Status: AC
  Administered 2021-04-04: 1000 mg via ORAL

## 2021-04-04 MED ORDER — KETAMINE HCL 10 MG/ML IJ SOLN
INTRAMUSCULAR | Status: DC | PRN
  Administered 2021-04-04 (×2): 10 mg via INTRAVENOUS

## 2021-04-04 MED ORDER — SCOPOLAMINE 1 MG/3DAYS TD PT72
1.0000 | MEDICATED_PATCH | TRANSDERMAL | Status: DC
  Administered 2021-04-04: 1.5 mg via TRANSDERMAL

## 2021-04-04 MED ORDER — INSULIN ASPART 100 UNIT/ML IJ SOLN
0.0000 [IU] | INTRAMUSCULAR | Status: DC
  Administered 2021-04-04: 7 [IU] via SUBCUTANEOUS
  Administered 2021-04-04: 11 [IU] via SUBCUTANEOUS
  Administered 2021-04-05: 4 [IU] via SUBCUTANEOUS
  Administered 2021-04-05: 7 [IU] via SUBCUTANEOUS
  Administered 2021-04-05 (×3): 4 [IU] via SUBCUTANEOUS

## 2021-04-04 MED ORDER — SUGAMMADEX SODIUM 500 MG/5ML IV SOLN
INTRAVENOUS | Status: DC | PRN
  Administered 2021-04-04: 400 mg via INTRAVENOUS

## 2021-04-04 MED ORDER — ENOXAPARIN SODIUM 30 MG/0.3ML IJ SOSY
30.0000 mg | PREFILLED_SYRINGE | Freq: Two times a day (BID) | INTRAMUSCULAR | Status: DC
  Administered 2021-04-04 – 2021-04-05 (×3): 30 mg via SUBCUTANEOUS
  Filled 2021-04-04 (×3): qty 0.3

## 2021-04-04 SURGICAL SUPPLY — 76 items
APL PRP STRL LF DISP 70% ISPRP (MISCELLANEOUS) ×2
APL SKNCLS STERI-STRIP NONHPOA (GAUZE/BANDAGES/DRESSINGS) ×1
APPLIER CLIP 5 13 M/L LIGAMAX5 (MISCELLANEOUS)
APPLIER CLIP ROT 10 11.4 M/L (STAPLE)
APPLIER CLIP ROT 13.4 12 LRG (CLIP)
APR CLP LRG 13.4X12 ROT 20 MLT (CLIP)
APR CLP MED LRG 11.4X10 (STAPLE)
APR CLP MED LRG 5 ANG JAW (MISCELLANEOUS)
BAG COUNTER SPONGE SURGICOUNT (BAG) ×2 IMPLANT
BAG SPNG CNTER NS LX DISP (BAG) ×1
BENZOIN TINCTURE PRP APPL 2/3 (GAUZE/BANDAGES/DRESSINGS) ×1 IMPLANT
BLADE SURG SZ11 CARB STEEL (BLADE) ×2 IMPLANT
BNDG ADH 1X3 SHEER STRL LF (GAUZE/BANDAGES/DRESSINGS) ×6 IMPLANT
BNDG ADH THN 3X1 STRL LF (GAUZE/BANDAGES/DRESSINGS) ×6
CABLE HIGH FREQUENCY MONO STRZ (ELECTRODE) IMPLANT
CHLORAPREP W/TINT 26 (MISCELLANEOUS) ×3 IMPLANT
CLIP APPLIE 5 13 M/L LIGAMAX5 (MISCELLANEOUS) IMPLANT
CLIP APPLIE ROT 10 11.4 M/L (STAPLE) IMPLANT
CLIP APPLIE ROT 13.4 12 LRG (CLIP) IMPLANT
COVER SURGICAL LIGHT HANDLE (MISCELLANEOUS) ×2 IMPLANT
DEVICE SUTURE ENDOST 10MM (ENDOMECHANICALS) ×2 IMPLANT
DRAIN CHANNEL 19F RND (DRAIN) IMPLANT
DRAIN PENROSE 0.25X18 (DRAIN) ×2 IMPLANT
ELECT L-HOOK LAP 45CM DISP (ELECTROSURGICAL) ×2
ELECTRODE L-HOOK LAP 45CM DISP (ELECTROSURGICAL) ×1 IMPLANT
EVACUATOR SILICONE 100CC (DRAIN) IMPLANT
GAUZE 4X4 16PLY ~~LOC~~+RFID DBL (SPONGE) ×2 IMPLANT
GAUZE SPONGE 4X4 12PLY STRL (GAUZE/BANDAGES/DRESSINGS) IMPLANT
GLOVE SURG POLYISO LF SZ7 (GLOVE) ×2 IMPLANT
GLOVE SURG UNDER POLY LF SZ7 (GLOVE) ×2 IMPLANT
GOWN STRL REUS W/TWL LRG LVL3 (GOWN DISPOSABLE) ×2 IMPLANT
GOWN STRL REUS W/TWL XL LVL3 (GOWN DISPOSABLE) ×7 IMPLANT
GRASPER SUT TROCAR 14GX15 (MISCELLANEOUS) IMPLANT
HANDLE STAPLE EGIA 4 XL (STAPLE) ×2 IMPLANT
IRRIG SUCT STRYKERFLOW 2 WTIP (MISCELLANEOUS) ×2
IRRIGATION SUCT STRKRFLW 2 WTP (MISCELLANEOUS) ×1 IMPLANT
KIT BASIN OR (CUSTOM PROCEDURE TRAY) ×2 IMPLANT
KIT GASTRIC LAVAGE 34FR ADT (SET/KITS/TRAYS/PACK) IMPLANT
MARKER SKIN DUAL TIP RULER LAB (MISCELLANEOUS) ×2 IMPLANT
MAT PREVALON FULL STRYKER (MISCELLANEOUS) ×2 IMPLANT
NDL SPNL 22GX3.5 QUINCKE BK (NEEDLE) ×1 IMPLANT
NEEDLE SPNL 22GX3.5 QUINCKE BK (NEEDLE) ×2 IMPLANT
PACK CARDIOVASCULAR III (CUSTOM PROCEDURE TRAY) ×2 IMPLANT
PENCIL SMOKE EVACUATOR (MISCELLANEOUS) IMPLANT
RELOAD EGIA 45 MED/THCK PURPLE (STAPLE) ×2 IMPLANT
RELOAD EGIA 45 TAN VASC (STAPLE) IMPLANT
RELOAD EGIA 60 MED/THCK PURPLE (STAPLE) ×6 IMPLANT
RELOAD EGIA 60 TAN VASC (STAPLE) ×6 IMPLANT
RELOAD ENDO STITCH 2.0 (ENDOMECHANICALS) ×22
RELOAD STAPLE 60 MED/THCK ART (STAPLE) ×4 IMPLANT
RELOAD SUT SNGL STCH ABSRB 2-0 (ENDOMECHANICALS) ×5 IMPLANT
RELOAD SUT SNGL STCH BLK 2-0 (ENDOMECHANICALS) ×6 IMPLANT
SCISSORS LAP 5X45 EPIX DISP (ENDOMECHANICALS) ×2 IMPLANT
SET TUBE SMOKE EVAC HIGH FLOW (TUBING) ×2 IMPLANT
SHEARS HARMONIC ACE PLUS 45CM (MISCELLANEOUS) ×2 IMPLANT
SLEEVE XCEL OPT CAN 5 100 (ENDOMECHANICALS) ×6 IMPLANT
SOL ANTI FOG 6CC (MISCELLANEOUS) ×1 IMPLANT
SOLUTION ANTI FOG 6CC (MISCELLANEOUS) ×1
STAPLER VISISTAT 35W (STAPLE) IMPLANT
STRIP CLOSURE SKIN 1/2X4 (GAUZE/BANDAGES/DRESSINGS) ×1 IMPLANT
SUT ETHIBOND 0 36 GRN (SUTURE) IMPLANT
SUT ETHILON 2 0 PS N (SUTURE) IMPLANT
SUT MNCRL AB 4-0 PS2 18 (SUTURE) ×2 IMPLANT
SUT RELOAD ENDO STITCH 2 48X1 (ENDOMECHANICALS) ×5
SUT RELOAD ENDO STITCH 2.0 (ENDOMECHANICALS) ×6
SUT SILK 0 SH 30 (SUTURE) IMPLANT
SUT VICRYL 0 TIES 12 18 (SUTURE) IMPLANT
SUTURE RELOAD END STTCH 2 48X1 (ENDOMECHANICALS) ×5 IMPLANT
SUTURE RELOAD ENDO STITCH 2.0 (ENDOMECHANICALS) ×6 IMPLANT
SYR 20ML LL LF (SYRINGE) ×2 IMPLANT
SYR 50ML LL SCALE MARK (SYRINGE) ×2 IMPLANT
TOWEL OR 17X26 10 PK STRL BLUE (TOWEL DISPOSABLE) ×2 IMPLANT
TOWEL OR NON WOVEN STRL DISP B (DISPOSABLE) ×2 IMPLANT
TROCAR BLADELESS OPT 5 100 (ENDOMECHANICALS) ×2 IMPLANT
TROCAR XCEL 12X100 BLDLESS (ENDOMECHANICALS) ×2 IMPLANT
TUBING CONNECTING 10 (TUBING) ×2 IMPLANT

## 2021-04-04 NOTE — Op Note (Signed)
Preop Diagnosis: Obesity Class III  Postop Diagnosis: same  Procedure performed: laparoscopic Roux en Y gastric bypass  Assitant: Phylliss Blakes  Indications:  The patient is a 55 y.o. year-old morbidly obese female who has been followed in the Bariatric Clinic as an outpatient. This patient was diagnosed with morbid obesity with a BMI of Body mass index is 54.15 kg/m. and significant co-morbidities including hypertension and non-insulin dependent diabetes.  The patient was counseled extensively in the Bariatric Outpatient Clinic and after a thorough explanation of the risks and benefits of surgery (including death from complications, bowel leak, infection such as peritonitis and/or sepsis, internal hernia, bleeding, need for blood transfusion, bowel obstruction, organ failure, pulmonary embolus, deep venous thrombosis, wound infection, incisional hernia, skin breakdown, and others entailed on the consent form) and after a compliant diet and exercise program, the patient was scheduled for an elective laparoscopic gastric bypass.  Description of Operation:  Following informed consent, the patient was taken to the operating room and placed on the operating table in the supine position.  She had previously received prophylactic antibiotics and subcutaneous heparin for DVT prophylaxis in the pre-op holding area.  After induction of general endotracheal anesthesia by the anesthesiologist, the patient underwent placement of sequential compression devices, Foley catheter and an oro-gastric tube.  A timeout was confirmed by the surgery and anesthesia teams.  The patient was adequately padded at all pressure points and placed on a footboard to prevent slippage from the OR table during extremes of position during surgery.  She underwent a routine sterile prep and drape of her entire abdomen.    Next, A transverse incision was made under the left subcostal area and a 14mm optical viewing trocar was introduced  into the peritoneal cavity. Pneumoperitoneum was applied with a high flow and low pressure. A laparoscope was inserted to confirm placement. A extraperitoneal block was then placed at the lateral abdominal wall using exparel diluted with marcaine . 5 additional trocars were placed: 1 1mm trocar to the left of the midline. 1 additional 95mm trocar in the left lateral area, 1 27mm trocar in the right mid abdomen, and 1 5mm trocar in the right subcostal area.  The greater omentum was flipped over the transverse colon and under the left lobe of the liver. The ligament of trietz was identified. 40cm of jejunum was measured starting from the ligament of Trietz. The mesentery was checked to ensure mobility. Next, a 70mm 2-104mm tristapler was used to divide the jejunum at this location. The harmonic scalpel was used to divide the mesentery down to the origin. A 1/2" penrose was sutured to the distal side. 100cm of jejunum was measured starting at the division. 2-0 silk was used to appose the biliary limb to the 100cm mark of jejunum in 2 places. Enterotomies were made in the biliary and common channels and a 46mm 2-3 tristapler was used to create the J-J anastomosis. A 2-0 silk was used to appose the enterotomy edges and a 2mm 2-3 tristapler was used to close the enterotomy. An anti-obstruction 2-0 silk suture was placed. Next, the mesenteric defect was closed with a 2-0 silk in running fashion.The J-J appeared patent and in neutral position.  Next, the omentum was divided using the Harmonic scalpel. The patient was placed in steep Reverse Trendelenberg position. A Nathanson retracted was placed through a subxiphoid incision and used to retract the liver. The fat pad over the fundus was incised to free the fundus. Next, a position along the  lesser curve 6cm from GE junction was identified. The pars flaccida was entered and the fat over the lesser curve divided to enter the lesser sac. Multiple 49mm 3-66mm tristaple  firings were peformed to create a 6cm pouch. The Roux limb was identified using the placed penrose and brought up to the stomach in antecolic fashion. The limb was inspected to ensure a neutral position. A 2-0 vicryl suture was then used to create a posterior layer connecting the stomach to the Roux limb jejunum in running fashion. Next cautery was used to create an enterotomy along the medial aspect of this suture line and Harmonic scalpel used to create gastotomy. A 90mm 3-19mm tristapler was then used to create a 25-74mm anastomosis. 2 2-0 vicryl sutures were used in running fashion to close the gastrotomy. Finally, a 2-0 vicryl suture was used to close an anterior layer of stomach and jejunum over the anastomosis in running fashion. The penrose was removed from the Roux limb. A 2-0 silk was used to appose the transverse mesocolon to the mesentery of the Roux limb.   The assistant then went and performed an upper endoscopy and leak test. No bubbles were seen and the pouch and limb distended appropriately. The limb and pouch were deflated, the endoscope was removed. Hemostasis was ensured. Pneumoperitoneum was evacuated, all ports were removed and all incisions closed with 4-0 monocryl suture in subcuticular fashion. Steristrips and bandaids were put in place for dressing. The patient awoke from anesthesia and was brought to pacu in stable condition. All counts were correct.  Specimens:  None  Estimated Blood Loss: 20 ml  Local Anesthesia: 50 ml Exparel: 0.5% Marcaine Mix  Post-Op Plan:       Pain Management: PO, prn      Antibiotics: Prophylactic      Anticoagulation: Prophylactic, Starting now      Post Op Studies/Consults: Not applicable      Intended Discharge: within 48h      Intended Outpatient Follow-Up: Two Week      Intended Outpatient Studies: Not Applicable      Other: Not Applicable   De Blanch Ceirra Belli

## 2021-04-04 NOTE — Anesthesia Postprocedure Evaluation (Signed)
Anesthesia Post Note  Patient: Brandy [name]  Procedure(s) Performed: LAPAROSCOPIC ROUX-EN-Y GASTRIC BYPASS WITH UPPER ENDOSCOPY     Patient location during evaluation: PACU Anesthesia Type: General Level of consciousness: awake and alert Pain management: pain level controlled Vital Signs Assessment: post-procedure vital signs reviewed and stable Respiratory status: spontaneous breathing, nonlabored ventilation and respiratory function stable Cardiovascular status: stable and blood pressure returned to baseline Anesthetic complications: no   No notable events documented.  Last Vitals:  Vitals:   04/04/21 1415 04/04/21 1515  BP: 134/63 111/85  Pulse: 74 69  Resp: (!) 22 18  Temp: 36.6 C 36.5 C  SpO2: 99% 98%    Last Pain:  Vitals:   04/04/21 1515  TempSrc: Oral  PainSc:                  Beryle Lathe

## 2021-04-04 NOTE — Anesthesia Preprocedure Evaluation (Addendum)
Anesthesia Evaluation  Patient identified by MRN, date of birth, ID band Patient awake    Reviewed: Allergy & Precautions, NPO status , Patient's Chart, lab work & pertinent test results  History of Anesthesia Complications (+) PONV and history of anesthetic complications  Airway Mallampati: III  TM Distance: >3 FB Neck ROM: Full    Dental  (+) Dental Advisory Given, Loose, Chipped,    Pulmonary sleep apnea (noncompliant) ,    Pulmonary exam normal        Cardiovascular hypertension, Pt. on medications + Peripheral Vascular Disease  Normal cardiovascular exam     Neuro/Psych PSYCHIATRIC DISORDERS Anxiety Depression  Poor memory, brain fog   Neuromuscular disease    GI/Hepatic negative GI ROS, Neg liver ROS,   Endo/Other  diabetes, Type 2, Oral Hypoglycemic AgentsMorbid obesity  Renal/GU negative Renal ROS     Musculoskeletal  (+) Arthritis , Fibromyalgia -  Abdominal   Peds  Hematology negative hematology ROS (+)   Anesthesia Other Findings   Reproductive/Obstetrics                            Anesthesia Physical Anesthesia Plan  ASA: 3  Anesthesia Plan: General   Post-op Pain Management: Tylenol PO (pre-op)   Induction: Intravenous  PONV Risk Score and Plan: 4 or greater and Treatment may vary due to age or medical condition, Ondansetron, Dexamethasone, Scopolamine patch - Pre-op, Midazolam and Aprepitant  Airway Management Planned: Oral ETT and Video Laryngoscope Planned  Additional Equipment: None  Intra-op Plan:   Post-operative Plan: Extubation in OR  Informed Consent: I have reviewed the patients History and Physical, chart, labs and discussed the procedure including the risks, benefits and alternatives for the proposed anesthesia with the patient or authorized representative who has indicated his/her understanding and acceptance.     Dental advisory given  Plan  Discussed with: CRNA and Anesthesiologist  Anesthesia Plan Comments:        Anesthesia Quick Evaluation

## 2021-04-04 NOTE — Progress Notes (Signed)
Discussed QI "Goals for Discharge" document with patient and daughter including ambulation in halls, Incentive Spirometry use every hour, and oral care.  Also discussed pain and nausea control.  Enabled or verified head of bed 30 degree alarm activated.  BSTOP education provided including BSTOP information guide, "Guide for Pain Management after your Bariatric Procedure".  Diet progression education provided including "Bariatric Surgery Post-Op Food Plan Phase 1: Liquids".  Questions answered.  Will continue to partner with bedside RN and follow up with patient per protocol.

## 2021-04-04 NOTE — Transfer of Care (Signed)
Immediate Anesthesia Transfer of Care Note  Patient: Brandy [name]  Procedure(s) Performed: LAPAROSCOPIC ROUX-EN-Y GASTRIC BYPASS WITH UPPER ENDOSCOPY  Patient Location: PACU  Anesthesia Type:General  Level of Consciousness: sedated  Airway & Oxygen Therapy: Patient Spontanous Breathing and Patient connected to face mask oxygen  Post-op Assessment: Report given to RN and Post -op Vital signs reviewed and stable  Post vital signs: Reviewed and stable  Last Vitals:  Vitals Value Taken Time  BP 157/140 04/04/21 1004  Temp    Pulse 84 04/04/21 1005  Resp 20 04/04/21 1006  SpO2 100 % 04/04/21 1005  Vitals shown include unvalidated device data.  Last Pain:  Vitals:   04/04/21 8325  TempSrc: Oral  PainSc:          Complications: No notable events documented.

## 2021-04-04 NOTE — Anesthesia Procedure Notes (Signed)
Procedure Name: Intubation Date/Time: 04/04/2021 7:49 AM Performed by: Minerva Ends, CRNA Pre-anesthesia Checklist: Patient identified, Emergency Drugs available, Suction available and Patient being monitored Patient Re-evaluated:Patient Re-evaluated prior to induction Oxygen Delivery Method: Circle System Utilized Preoxygenation: Pre-oxygenation with 100% oxygen Induction Type: IV induction and Rapid sequence Ventilation: Mask ventilation without difficulty Grade View: Grade I Tube type: Oral Tube size: 7.0 mm Number of attempts: 1 Airway Equipment and Method: Stylet, Oral airway and Video-laryngoscopy Placement Confirmation: ETT inserted through vocal cords under direct vision, positive ETCO2 and breath sounds checked- equal and bilateral Secured at: 21 cm Tube secured with: Tape Dental Injury: Teeth and Oropharynx as per pre-operative assessment  Comments: Smooth IV induction Brock- RSI- no mask ventilation- Glidescope due to BMI and loose teeth on bottom- atraumatic- teeth and mouth as preop - bilat BS

## 2021-04-04 NOTE — H&P (Signed)
Chief Complaint: Weight Loss  History of Present Illness: Brandy [name] is a 55 y.o. female who is seen today for bariatric preop talk.  She underwent bunion surgery last month and is recovering well from that. She has no other new issues.   Review of Systems: A complete review of systems was obtained from the patient. I have reviewed this information and discussed as appropriate with the patient. See HPI as well for other ROS.  Review of Systems  Constitutional: Negative.  HENT: Negative.  Eyes: Negative.  Respiratory: Negative.  Cardiovascular: Negative.  Gastrointestinal: Negative.  Genitourinary: Negative.  Musculoskeletal: Positive for joint pain.  Skin: Negative.  Neurological: Negative.  Endo/Heme/Allergies: Negative.  Psychiatric/Behavioral: Negative.    Medical History: Past Medical History:  Diagnosis Date   Anxiety   Arthritis   Chronic fatigue   Depression   Diabetes mellitus without complication (CMS-HCC)   Dysmetabolic syndrome X   Hypertension   Hypertriglyceridemia   Obesity   Sleep apnea   Tetanus vaccine causing adverse effect in therapeutic use   Vitamin B12 deficiency   Vitamin D deficiency   There is no problem list on file for this patient.  Past Surgical History:  Procedure Laterality Date   bone tumor excision right leg   Dilatation and Curettage/Hysteroscopy, Myosure, Polypectomy (Vagina) 10/07/2019  Dr. Mitzi Hansen    Allergies  Allergen Reactions   Diphenhydramine Rash   Penicillin Unknown  As a child   Current Outpatient Medications on File Prior to Visit  Medication Sig Dispense Refill   atorvastatin (LIPITOR) 10 MG tablet Take 10 mg by mouth once daily   busPIRone (BUSPAR) 10 MG tablet Take 10 mg by mouth 2 (two) times daily   cetirizine (ZYRTEC) 10 MG tablet Take 10 mg by mouth once daily   citalopram (CELEXA) 40 MG tablet Take 40 mg by mouth once daily   dulaglutide (TRULICITY) 1.5 mg/0.5 mL subcutaneous injection Inject  1.5 mg subcutaneously every 7 (seven) days   DULoxetine (CYMBALTA) 60 MG DR capsule Take 60 mg by mouth once daily   ergocalciferol, vitamin D2, 1,250 mcg (50,000 unit) capsule Take 50,000 Units by mouth once a week   gabapentin (NEURONTIN) 300 MG capsule Take 1 capsule (300 mg total) by mouth 3 (three) times daily for 90 days 270 capsule 0   glipiZIDE (GLUCOTROL) 10 MG tablet Take 10 mg by mouth 2 (two) times daily before meals   lamoTRIgine (LAMICTAL) 25 MG tablet Take 100 mg by mouth once daily   lisinopriL-hydrochlorothiazide (ZESTORETIC) 20-12.5 mg tablet Take 1 tablet by mouth once daily   metFORMIN (GLUCOPHAGE) 500 MG tablet Take 1,000 mg by mouth daily with breakfast   No current facility-administered medications on file prior to visit.   Family History  Problem Relation Age of Onset   Skin cancer Father   High blood pressure (Hypertension) Father    Social History   Tobacco Use  Smoking Status Never  Smokeless Tobacco Never    Social History   Socioeconomic History   Marital status: Married  Tobacco Use   Smoking status: Never   Smokeless tobacco: Never  Substance and Sexual Activity   Alcohol use: Yes  Comment: 1-2 x per year   Drug use: Never   Objective:   Vitals:  03/22/21 1440  BP: 124/86  Pulse: 78  Temp: 36.9 C (98.4 F)  SpO2: 98%  Weight: (!) 157.9 kg (348 lb)  Height: 167.6 cm (5\' 6" )   Body mass index is  56.17 kg/m.  Physical Exam Constitutional:  Appearance: Normal appearance.  HENT:  Head: Normocephalic and atraumatic.  Pulmonary:  Effort: Pulmonary effort is normal.  Musculoskeletal:  General: Normal range of motion.  Cervical back: Normal range of motion.  Comments: Left foot boot  Neurological:  General: No focal deficit present.  Mental Status: She is alert and oriented to person, place, and time. Mental status is at baseline.  Psychiatric:  Mood and Affect: Mood normal.  Behavior: Behavior normal.  Thought Content: Thought  content normal.    Labs, Imaging and Diagnostic Testing:  I reviewed dietician notes and cardiac work up  Assessment and Plan:  Diagnoses and all orders for this visit:  Morbid obesity (CMS-HCC)  OSA (obstructive sleep apnea)  Type 2 diabetes mellitus without complication, without long-term current use of insulin (CMS-HCC)  Essential hypertension    The patient meets weight loss surgery criteria. Due to the above reasons, I think laparoscopic vertical sleeve gastrectomy is the best option for the patient.   We discussed LSG. We discussed the preoperative, operative and postoperative process. I explained the surgery in detail including the performance of an EGD near the end of the surgery to test for leak. We discussed the typical hospital course including a 1-2 day stay baring any complications. The patient was given educational material. I quoted the patient that most patients can lose up to 50-70% of their excess weight. We did discuss the possibility of weight regain several years after the procedure.   The risks of infection, bleeding, pain, scarring, weight regain, too little or too much weight loss, vitamin deficiencies and need for lifelong vitamin supplementation, hair loss, need for protein supplementation, leaks, stricture, reflux, food intolerance, gallstone formation, hernia, need for reoperation, need for open surgery, injury to spleen or surrounding structures, DVT's, PE, and death again discussed with the patient and the patient expressed understanding and desires to proceed with laparoscopic sleeve gastrectomy, possible open, intraoperative endoscopy.  We discussed that before and after surgery that there would be an alteration in their diet. I explained that we may put them on a diet 2 weeks before surgery. I also explained that they would be on a liquid diet for 2 weeks after surgery. We discussed that they would have to avoid certain foods after surgery. We discussed the  importance of physical activity as well as compliance with our dietary and supplement recommendations and routine follow-up.

## 2021-04-04 NOTE — Progress Notes (Signed)
PHARMACY CONSULT FOR:  Risk Assessment for Post-Discharge VTE Following Bariatric Surgery  Post-Discharge VTE Risk Assessment: This patient's probability of 30-day post-discharge VTE is increased due to the factors marked:   Female    Age >/=60 years   X BMI >/=50 kg/m2    CHF    Dyspnea at Rest    Paraplegia   X Non-gastric-band surgery    Operation Time >/=3 hr    Return to OR     Length of Stay >/= 3 d   Hx of VTE   Hypercoagulable condition   Significant venous stasis     Predicted probability of 30-day post-discharge VTE: 0.27%  Other patient-specific factors to consider: N/A  Recommendation for Discharge: No pharmacologic prophylaxis post-discharge  Brandy [name] is a 55 y.o. 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  Recent Labs    04/04/21 1016  HGB 12.8  HCT 37.1   Estimated Creatinine Clearance: 118.2 mL/min (by C-G formula based on SCr of 0.74 mg/dL).    Past Medical History:  Diagnosis Date   Anxiety    Arthritis    Back pain    Brain fog    CHF (congestive heart failure) (HCC)    pt not aware of this   Depression    Diabetes mellitus without complication (HCC)    Difficulty walking    Dysmetabolic syndrome X    Fatigue    Fibromyalgia    neuropathy   Hypertension    Hypertriglyceridemia    Joint pain    Leg weakness    Lower extremity edema    Muscle atrophy    Neuropathy    Obesity    Osteoarthritis    PONV (postoperative nausea and vomiting)    Poor memory    Post-menopausal bleeding    Prediabetes    Rosacea    Sleep apnea    not on cpap yet   Tetanus vaccine causing adverse effect in therapeutic use    Vitamin D deficiency      Medications Prior to  Admission  Medication Sig Dispense Refill Last Dose   atorvastatin (LIPITOR) 10 MG tablet TAKE 1 TABLET BY MOUTH DAILY. FOR CHOLESTEROL 90 tablet 1 04/04/2021 at 0345   celecoxib (CELEBREX) 100 MG capsule Take 1 capsule (100 mg total) by mouth 2 (two) times daily. As needed for pain. 180 capsule 1 04/03/2021   cetirizine (ZYRTEC) 10 MG tablet TAKE 1 TABLET (10 MG TOTAL) BY MOUTH DAILY. FOR ALLERGIES 90 tablet 1 04/04/2021 at 0345   citalopram (CELEXA) 40 MG tablet Take 40 mg by mouth daily.   04/04/2021 at 0345   DULoxetine (CYMBALTA) 30 MG capsule Take 30 mg by mouth daily. Take with 60 mg to equal 90 mg daily   04/04/2021 at 0345   DULoxetine (CYMBALTA) 60 MG capsule Take 60 mg by mouth daily. Take with 30 mg to equal 90 mg daily   04/04/2021 at 0345   folic acid (FOLVITE) 1 MG tablet Take 1 mg by mouth daily.   04/03/2021   gabapentin (NEURONTIN) 300 MG capsule TAKE 1 CAPSULE BY MOUTH 3 TIMES A DAY AS NEEDED FOR BACK PAIN 270 capsule 3 04/03/2021   glipiZIDE (GLUCOTROL) 10  MG tablet TAKE 1 TABLET (10 MG TOTAL) BY MOUTH 2 (TWO) TIMES DAILY BEFORE A MEAL. FOR DIABETES. (Patient taking differently: Take 10 mg by mouth daily before breakfast.) 180 tablet 3 04/03/2021   lamoTRIgine (LAMICTAL) 100 MG tablet Take 100 mg by mouth every evening.   04/03/2021   lamoTRIgine (LAMICTAL) 200 MG tablet Take 200 mg by mouth in the morning.   04/04/2021 at 0345   lisinopril (ZESTRIL) 30 MG tablet TAKE 1 TABLET (30 MG TOTAL) BY MOUTH DAILY. FOR BLOOD PRESSURE 90 tablet 3 04/03/2021   metFORMIN (GLUCOPHAGE-XR) 500 MG 24 hr tablet TAKE 2 TABLETS (1,000 MG TOTAL) BY MOUTH DAILY WITH BREAKFAST. FOR DIABETES. 180 tablet 3 04/03/2021   metroNIDAZOLE (METROCREAM) 0.75 % cream Apply 1 application topically daily as needed (rosacea).   04/03/2021   Multiple Minerals-Vitamins (CALCIUM-MAGNESIUM-ZINC-D3) TABS Take 2 tablets by mouth daily.   04/03/2021   traZODone (DESYREL) 50 MG tablet Take 50 mg by mouth at bedtime as needed for sleep.    04/03/2021   Vitamin D, Ergocalciferol, (DRISDOL) 1.25 MG (50000 UNIT) CAPS capsule TAKE 1 CAPSULE BY MOUTH ONCE WEEKLY FOR VITAMIN D. 12 capsule 1 04/03/2021   acetaminophen (TYLENOL) 650 MG CR tablet Take 1,300 mg by mouth every 8 (eight) hours as needed for pain.   More than a month    Herby Abraham, Pharm.D 04/04/2021 1:38 PM

## 2021-04-04 NOTE — Anesthesia Procedure Notes (Signed)
Date/Time: 04/04/2021 9:58 AM Performed by: Minerva Ends, CRNA Oxygen Delivery Method: Simple face mask Placement Confirmation: positive ETCO2 and breath sounds checked- equal and bilateral Dental Injury: Teeth and Oropharynx as per pre-operative assessment

## 2021-04-04 NOTE — Op Note (Signed)
Preoperative diagnosis: Roux-en-Y gastric bypass  Postoperative diagnosis: Same   Procedure: Upper endoscopy   Surgeon: Berna Bue, M.D.  Anesthesia: Gen.   Description of procedure: The endoscope was placed in the mouth and oropharynx and under endoscopic vision it was advanced to the esophagogastric junction which was identified at 38cm from the teeth.  The pouch was tensely insufflated while the upper abdomen was flooded with irrigation to perform a leak test, which was negative. No bubbles were seen.  The staple line was hemostatic and the anastomosis was able to be traversed with the endoscope. The pouch measures approximately 5cm in length and there is no retained fundus. The lumen was decompressed and the scope was withdrawn without difficulty.    Berna Bue, M.D. General, Bariatric, & Minimally Invasive Surgery [facility]

## 2021-04-05 ENCOUNTER — Other Ambulatory Visit (HOSPITAL_COMMUNITY): Payer: Self-pay

## 2021-04-05 ENCOUNTER — Encounter (HOSPITAL_COMMUNITY): Payer: Self-pay | Admitting: General Surgery

## 2021-04-05 ENCOUNTER — Other Ambulatory Visit: Payer: Self-pay

## 2021-04-05 LAB — CBC WITH DIFFERENTIAL/PLATELET
Abs Immature Granulocytes: 0.05 10*3/uL (ref 0.00–0.07)
Basophils Absolute: 0 10*3/uL (ref 0.0–0.1)
Basophils Relative: 0 %
Eosinophils Absolute: 0 10*3/uL (ref 0.0–0.5)
Eosinophils Relative: 0 %
HCT: 35.5 % — ABNORMAL LOW (ref 36.0–46.0)
Hemoglobin: 11.8 g/dL — ABNORMAL LOW (ref 12.0–15.0)
Immature Granulocytes: 0 %
Lymphocytes Relative: 10 %
Lymphs Abs: 1.2 10*3/uL (ref 0.7–4.0)
MCH: 30.6 pg (ref 26.0–34.0)
MCHC: 33.2 g/dL (ref 30.0–36.0)
MCV: 92 fL (ref 80.0–100.0)
Monocytes Absolute: 0.7 10*3/uL (ref 0.1–1.0)
Monocytes Relative: 6 %
Neutro Abs: 10.5 10*3/uL — ABNORMAL HIGH (ref 1.7–7.7)
Neutrophils Relative %: 84 %
Platelets: 288 10*3/uL (ref 150–400)
RBC: 3.86 MIL/uL — ABNORMAL LOW (ref 3.87–5.11)
RDW: 13.3 % (ref 11.5–15.5)
WBC: 12.5 10*3/uL — ABNORMAL HIGH (ref 4.0–10.5)
nRBC: 0 % (ref 0.0–0.2)

## 2021-04-05 LAB — COMPREHENSIVE METABOLIC PANEL
ALT: 18 U/L (ref 0–44)
AST: 17 U/L (ref 15–41)
Albumin: 3.7 g/dL (ref 3.5–5.0)
Alkaline Phosphatase: 76 U/L (ref 38–126)
Anion gap: 7 (ref 5–15)
BUN: 12 mg/dL (ref 6–20)
CO2: 24 mmol/L (ref 22–32)
Calcium: 8.6 mg/dL — ABNORMAL LOW (ref 8.9–10.3)
Chloride: 102 mmol/L (ref 98–111)
Creatinine, Ser: 0.79 mg/dL (ref 0.44–1.00)
GFR, Estimated: >60 mL/min (ref >60)
Glucose, Bld: 179 mg/dL — ABNORMAL HIGH (ref 70–99)
Potassium: 4.3 mmol/L (ref 3.5–5.1)
Sodium: 133 mmol/L — ABNORMAL LOW (ref 135–145)
Total Bilirubin: 0.5 mg/dL (ref 0.3–1.2)
Total Protein: 6.9 g/dL (ref 6.5–8.1)

## 2021-04-05 LAB — GLUCOSE, CAPILLARY
Glucose-Capillary: 173 mg/dL — ABNORMAL HIGH (ref 70–99)
Glucose-Capillary: 189 mg/dL — ABNORMAL HIGH (ref 70–99)
Glucose-Capillary: 189 mg/dL — ABNORMAL HIGH (ref 70–99)
Glucose-Capillary: 195 mg/dL — ABNORMAL HIGH (ref 70–99)
Glucose-Capillary: 213 mg/dL — ABNORMAL HIGH (ref 70–99)

## 2021-04-05 MED ORDER — FAMOTIDINE 20 MG PO TABS
20.0000 mg | ORAL_TABLET | Freq: Two times a day (BID) | ORAL | Status: DC
  Administered 2021-04-05: 20 mg via ORAL
  Filled 2021-04-05: qty 1

## 2021-04-05 MED ORDER — ONDANSETRON 4 MG PO TBDP
4.0000 mg | ORAL_TABLET | Freq: Four times a day (QID) | ORAL | 0 refills | Status: AC | PRN
  Filled 2021-04-05: qty 18, 5d supply, fill #0

## 2021-04-05 MED ORDER — ACETAMINOPHEN 500 MG PO TABS: 1000.0000 mg | ORAL_TABLET | Freq: Three times a day (TID) | ORAL | 0 refills | Status: AC

## 2021-04-05 MED ORDER — PANTOPRAZOLE SODIUM 40 MG PO TBEC
40.0000 mg | DELAYED_RELEASE_TABLET | Freq: Every day | ORAL | 0 refills | Status: AC
  Filled 2021-04-05: qty 31, 31d supply, fill #0

## 2021-04-05 NOTE — Progress Notes (Signed)
Progress Note: Metabolic and Bariatric Surgery Service   Chief Complaint/Subjective: Pain minimal, tolerating liquids  Objective: Vital signs in last 24 hours: Temp:  [97.6 F (36.4 C)-98.7 F (37.1 C)] 98.3 F (36.8 C) (12/07 0527) Pulse Rate:  [65-87] 87 (12/07 0527) Resp:  [16-22] 18 (12/07 0527) BP: (106-145)/(52-88) 139/69 (12/07 0527) SpO2:  [98 %-100 %] 99 % (12/07 0527) Last BM Date: 04/04/21  Intake/Output from previous day: 12/06 0701 - 12/07 0700 In: 3648.9 [P.O.:240; I.V.:3208.7; IV Piggyback:200.2] Out: 2570 [Urine:2550; Blood:20] Intake/Output this shift: No intake/output data recorded.  Lungs: nonlabored  Cardiovascular: RRR  Abd: soft, incisions c/d/i  Extremities: no edema  Neuro: AOx4  Lab Results: CBC  Recent Labs    04/04/21 1016 04/05/21 0429  WBC  --  12.5*  HGB 12.8 11.8*  HCT 37.1 35.5*  PLT  --  288   BMET Recent Labs    04/05/21 0429  NA 133*  K 4.3  CL 102  CO2 24  GLUCOSE 179*  BUN 12  CREATININE 0.79  CALCIUM 8.6*   PT/INR No results for input(s): LABPROT, INR in the last 72 hours. ABG No results for input(s): PHART, HCO3 in the last 72 hours.  Invalid input(s): PCO2, PO2  Studies/Results:  Anti-infectives: Anti-infectives (From admission, onward)    Start     Dose/Rate Route Frequency Ordered Stop   04/04/21 0600  cefoTEtan (CEFOTAN) 2 g in sodium chloride 0.9 % 100 mL IVPB        2 g 200 mL/hr over 30 Minutes Intravenous On call to O.R. 04/04/21 0519 04/04/21 0820   04/04/21 0542  sodium chloride 0.9 % with cefoTEtan (CEFOTAN) ADS Med       Note to Pharmacy: Montel Clock E: cabinet override      04/04/21 0542 04/04/21 0813       Medications: Scheduled Meds:  acetaminophen  1,000 mg Oral Q8H   Or   acetaminophen (TYLENOL) oral liquid 160 mg/5 mL  1,000 mg Oral Q8H   citalopram  40 mg Oral Daily   DULoxetine  90 mg Oral Daily   enoxaparin (LOVENOX) injection  30 mg Subcutaneous BID    famotidine  20 mg Oral BID   gabapentin  300 mg Oral TID   insulin aspart  0-20 Units Subcutaneous Q4H   lamoTRIgine  100 mg Oral QPM   lamoTRIgine  200 mg Oral QAC breakfast   loratadine  10 mg Oral Daily   Ensure Max Protein  2 oz Oral Q2H   Continuous Infusions:  dextrose 5 % and 0.45% NaCl 125 mL/hr at 04/05/21 0525   PRN Meds:.hydrALAZINE, morphine injection, ondansetron (ZOFRAN) IV, oxyCODONE, simethicone, traZODone  Assessment/Plan: Patient Active Problem List   Diagnosis Date Noted   Morbid (severe) obesity due to excess calories (HCC) 04/04/2021   Near syncope 10/04/2020   Severe obstructive sleep apnea-hypopnea syndrome 09/23/2019   Class 3 severe obesity with serious comorbidity and body mass index (BMI) of 50.0 to 59.9 in adult (HCC) 09/23/2019   Abdominal pain 09/14/2019   Excessive daytime sleepiness 08/07/2019   Acute diastolic congestive heart failure (HCC) 08/07/2019   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 07/07/2019   Post-menopausal bleeding 06/17/2019   Vaginal burning 06/17/2019   Urinary incontinence 06/17/2019   Lower extremity weakness 05/13/2019   Poor short term memory 05/13/2019   Left groin pain 03/18/2019   Rosacea 08/25/2018   Chronic venous insufficiency 08/07/2018   PAD (peripheral artery disease) (HCC)  08/07/2018   Chronic bilateral low back pain with left-sided sciatica 07/04/2018   Vitamin B12 deficiency 05/23/2018   Arthralgia of multiple sites 05/01/2018   Abnormal TSH 03/17/2018   GAD (generalized anxiety disorder) 02/24/2018   Vitamin D deficiency 02/24/2018   Bilateral hand pain 04/06/2014   Bilateral wrist pain 04/06/2014   Chronic fatigue 03/15/2014   Palpitations 09/20/2010   Essential hypertension, benign 09/14/2010   HYPERTRIGLYCERIDEMIA 10/04/2009   Lower extremity pain 10/04/2009   Type 2 diabetes mellitus (HCC) 08/12/2009   OBESITY, MORBID 08/12/2009   MDD (major depressive disorder) 08/12/2009   s/p  Procedure(s): LAPAROSCOPIC ROUX-EN-Y GASTRIC BYPASS WITH UPPER ENDOSCOPY 04/04/2021   Disposition:  LOS: 1 day  The patient should be discharged from the hospital today  Rodman Pickle, MD [phone] [facility]

## 2021-04-05 NOTE — Discharge Instructions (Signed)

## 2021-04-05 NOTE — Progress Notes (Signed)
Patient alert and oriented, pain is controlled. Patient is tolerating fluids, advanced to protein shake today, patient is tolerating well. Reviewed Gastric Bypass discharge instructions with patient and patient is able to articulate understanding. Provided information on BELT program, Support Group and [facility] outpatient pharmacy. All questions answered, will continue to monitor.    

## 2021-04-05 NOTE — Progress Notes (Signed)
Discharge instructions given to patient and all questions were answered.  

## 2021-04-06 ENCOUNTER — Ambulatory Visit (INDEPENDENT_AMBULATORY_CARE_PROVIDER_SITE_OTHER): Payer: BC Managed Care – PPO | Admitting: Psychology

## 2021-04-06 DIAGNOSIS — F339 Major depressive disorder, recurrent, unspecified: Secondary | ICD-10-CM

## 2021-04-06 NOTE — Progress Notes (Signed)
24hr fluid recall prior to discharge: 540mL. Per dehydration protocol, will call pt to f/u within one week post op. 

## 2021-04-06 NOTE — Progress Notes (Signed)
[place] Counselor/Therapist Progress Note  Patient ID: Brandy [name], MRN: [id number]    Date: 04/06/21  Time Spent: 12:34  pm - 1:20 pm : 46 Minutes  Treatment Type: Individual Therapy.  Reported Symptoms: Anxiety   Mental Status Exam: Appearance:  Fairly Groomed     Behavior: Appropriate  Motor: Normal  Speech/Language:  Clear and Coherent and Normal Rate  Affect: Appropriate  Mood: normal  Thought process: normal  Thought content:   WNL  Sensory/Perceptual disturbances:   WNL  Orientation: oriented to person, place, time/date, and situation  Attention: Good  Concentration: Good  Memory: WNL  Fund of knowledge:  Good  Insight:   Good  Judgment:  Good  Impulse Control: Good   Risk Assessment: Danger to Self:  No Self-injurious Behavior: No Danger to Others: No Duty to Warn:no Physical Aggression / Violence:No  Access to Firearms a concern: No  Gang Involvement:No   Subjective:   Brandy [name] participated home, via video, and consented to treatment. Therapist participated from office. We met online due to [place] pandemic. Brandy [name] reviewed the events of the past week.  Brandy [name] discussed her recent bariatric surgery on Dec 6th, 2022. We processed this experience during the session along with the numerous adjustments and transitions that have occurred and will occur as a result. Zaryia endorsed anxiety regarding possible post-operative complications partially driven by her own online research. Therapist encouraged Brandy [name] to engage in consistent mindfulness day-to-day to her mood and physical symptoms and adherence to the bariatric process post-op.  We discussed morning and nighttime check-ins to achieve this goal.  Therapist modeled this during the session.  Therapist encouraged Brandy [name] to contact her medical provider regarding questions regarding her medication regimen as she discussed some confusion regarding which medications to take.  Brandy [name] discussed  her agreement with this suggestion and committed to contacting her prescribers asked these questions to ensure adherence to medication regimens.  Therapist validated Brandy [name]'s experience feelings during the session praised Brandy [name] for her effort and energy and provided supportive therapy.  Interventions: Mindfulness Meditation  Diagnosis: Episode of recurrent major depressive disorder, unspecified depression episode severity ([place])   Related Problem: Alleviate depressive symptoms and return to previous level of effective functioning. Description: Identify important people in life, past and present, and describe the quality, good and poor, of those relationships. Target Date: 2021-05-28 Progress: 50%  Related Problem: Alleviate depressive symptoms and return to previous level of effective functioning. Description: Verbalize an understanding and resolution of current interpersonal problems. Target Date: 2021-05-28 Progress: 30%  Related Problem: Alleviate depressive symptoms and return to previous level of effective functioning. Description: Implement mindfulness techniques for relapse prevention. Target Date: 2021-05-28 Progress: 0%  Related Problem: Alleviate depressive symptoms and return to previous level of effective functioning. Description: Learn and implement conflict resolution skills to resolve interpersonal problems. Target Date: 2021-05-28 Progress: 20%  Related Problem: Develop healthy interpersonal relationships that lead to the alleviation and help prevent the relapse of depression. Description: Verbalize insight into how past relationships may be influencing current experiences with depression. Target Date: 2021-05-28 Progress: 0%  Related Problem: Learn and implement coping skills that result in a reduction of anxiety and worry, and improved daily functioning. Description: Identify and engage in pleasant activities on a daily basis.  Progress: 20%  Related Problem: Learn  and implement coping skills that result in a reduction of anxiety and worry, and improved daily functioning. Description: Learn and implement calming skills to reduce overall anxiety and  manage anxiety symptoms. Target Date: 2021-05-28 Progress: 0%  Related Problem: Learn and implement coping skills that result in a reduction of anxiety and worry, and improved daily functioning. Description: Identify the major life conflicts from the past and present that form the basis for present anxiety. Target Date: 2021-05-28 Progress: 0%  Related Problem: Learn and implement coping skills that result in a reduction of anxiety and worry, and improved daily functioning. Description: Reestablish a consistent sleep-wake cycle. Target Date: 2021-05-28 Progress: 10%  Related Problem: Learn and implement coping skills that result in a reduction of anxiety and worry, and improved daily functioning. Description: Learn and implement problem-solving strategies for realistically addressing worries. Target Date: 2021-05-28 Progress: 0%  Related Problem: Learn and implement coping skills that result in a reduction of anxiety and worry, and improved daily functioning. Description: Learn and implement a strategy to limit the association between various environmental settings and worry, delaying the worry until a designated "worry time." Target Date: 2021-05-28 Progress: 0%  Related Problem: Learn and implement coping skills that result in a reduction of anxiety and worry, and improved daily functioning. Description: Identify, challenge, and replace biased, fearful self-talk with positive, realistic, and empowering self-talk. Target Date: 2021-05-28 Progress: 20%  Related Problem: Learn and implement coping skills that result in a reduction of anxiety and worry, and improved daily functioning. Description: Cooperate with a medication evaluation by a physician. Target Date: 2021-05-28 Progress: 100%  Related  Problem: Recognize, accept, and cope with feelings of depression. Description: Learn and implement behavioral strategies to overcome depression. Target Date: 2021-05-28 Progress: 10%  Related Problem: Recognize, accept, and cope with feelings of depression. Description: Increasingly verbalize hopeful and positive statements regarding self, others, and the future. Target Date: 2021-05-28 Progress: 20%  Related Problem: Recognize, accept, and cope with feelings of depression. Description: Read books on overcoming depression. Target Date: 2021-05-28 Progress: 0%  Related Problem: Recognize, accept, and cope with feelings of depression. Description: Identify and replace thoughts and beliefs that support depression. Target Date: 2021-05-28 Progress: 20%  [name], LCSW

## 2021-04-10 ENCOUNTER — Telehealth (HOSPITAL_COMMUNITY): Payer: Self-pay | Admitting: *Deleted

## 2021-04-10 NOTE — Telephone Encounter (Signed)
1.  Tell me about your pain and pain management? Pt states that she has intermittent "spasms" when drinking some liquids, but is able to tolerate liquids.  Pt states that she has not needed to take pain medication for the discomfort because it doesn't last long.  Pt instructed to continue to monitor symptoms and if they worsen, to call [facility].  2.  Let's talk about fluid intake.  How much total fluid are you taking in? Pt states that she is working to meet goal of 64 oz of fluid today.  Pt has been able to consume approx. 32-38 oz of fluid per day since surgery.  Pt and I discussed plan to meet fluid goals. Pt plans to increase clear liquids to meet fluid goals.  Pt instructed to assess status and suggestions daily utilizing Hydration Action Plan on discharge folder and to call [facility] if in the "red zone". Pt denies any red zone symptoms.  3.  How much protein have you taken in the last 2 days? Pt states that she is working to meet goal of goal of 60g of protein today.  Pt has already consumed one protein shake.  Pt plans to drink remainder of protein and also buying protein powder to meet goal.  Will f/u with pt.  4.  Have you had nausea?  Tell me about when have experienced nausea and what you did to help? Pt denies nausea. Pt states that she has not needed to take any nausea medication.   5.  Has the frequency or color changed with your urine? Pt states that she is urinating "ok" with no changes in frequency or urgency.     6.  Tell me what your incisions look like? "Incisions look fine". Pt denies a fever, chills.  Pt states incisions are not swollen, open, or draining.  Pt encouraged to call [facility] if incisions change.   7.  Have you been passing gas? BM? Pt states that she has not had a BM.  Pt instructed to take either Miralax or MoM as instructed per "Gastric Bypass/Sleeve Discharge Home Care Instructions".  Pt to call surgeon's office if not able to have BM with medication.   8.  If a problem or  question were to arise who would you call?  Do you know contact numbers for [facility], [facility], and [facility]? Pt denies dehydration symptoms.  Pt can describe s/sx of dehydration.  Pt knows to call [facility] for surgical, [facility] for nutrition, and [facility] for non-urgent questions or concerns.   9.  How has the walking going? Pt states she is walking around and able to be active without difficulty.   10. Are you still using your incentive spirometer?  If so, how often? Pt states that she does I.S several times a day (3x each). Pt encouraged to use incentive spirometer, at least 10x every hour while awake until she sees the surgeon.  11.  How are your vitamins and calcium going?  How are you taking them? Pt states that she is taking her supplements and vitamins without difficulty. Pt is working on setting reminders to ensure that she gets the 3rd calcium supplement each day.  Reminded patient that the first 30 days post-operatively are important for successful recovery.  Practice good hand hygiene, wearing a mask when appropriate (since optional in most places), and minimizing exposure to people who live outside of the home, especially if they are exhibiting any respiratory, GI, or illness-like symptoms.

## 2021-04-13 ENCOUNTER — Ambulatory Visit (INDEPENDENT_AMBULATORY_CARE_PROVIDER_SITE_OTHER): Payer: BC Managed Care – PPO | Admitting: Psychology

## 2021-04-13 ENCOUNTER — Other Ambulatory Visit: Payer: Self-pay | Admitting: Primary Care

## 2021-04-13 DIAGNOSIS — F339 Major depressive disorder, recurrent, unspecified: Secondary | ICD-10-CM

## 2021-04-13 DIAGNOSIS — E119 Type 2 diabetes mellitus without complications: Secondary | ICD-10-CM
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
## 2021-04-17 ENCOUNTER — Encounter: Payer: BC Managed Care – PPO | Attending: General Surgery | Admitting: Dietician

## 2021-04-17 ENCOUNTER — Encounter: Payer: Self-pay | Admitting: Dietician

## 2021-04-17 ENCOUNTER — Other Ambulatory Visit: Payer: Self-pay

## 2021-04-17 VITALS — Ht 66.0 in | Wt 332.6 lb

## 2021-04-17 DIAGNOSIS — E1165 Type 2 diabetes mellitus with hyperglycemia: Secondary | ICD-10-CM | POA: Diagnosis not present

## 2021-04-17 DIAGNOSIS — Z6841 Body Mass Index (BMI) 40.0 and over, adult: Secondary | ICD-10-CM | POA: Diagnosis not present

## 2021-04-17 NOTE — Patient Instructions (Signed)
Start some solid protein foods for one or more meals/ snacks daily.  Continue to drink plenty of fluids 15 or more minutes before and 30 minutes after solid foods.  Gradually increase physical activity as cleared by surgeon and as tolerated.
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
## 2021-05-04 ENCOUNTER — Ambulatory Visit (INDEPENDENT_AMBULATORY_CARE_PROVIDER_SITE_OTHER): Payer: BC Managed Care – PPO | Admitting: Psychology

## 2021-05-04 DIAGNOSIS — F339 Major depressive disorder, recurrent, unspecified: Secondary | ICD-10-CM | POA: Diagnosis not present
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
## 2021-05-10 ENCOUNTER — Encounter: Payer: Self-pay | Admitting: Primary Care

## 2021-05-10 ENCOUNTER — Other Ambulatory Visit: Payer: Self-pay

## 2021-05-10 ENCOUNTER — Encounter: Payer: BC Managed Care – PPO | Admitting: Primary Care

## 2021-05-10 NOTE — Discharge Summary (Signed)
Physician Discharge Summary  Brandy [name] [id number] DOB: 06-May-1965 DOA: 04/04/2021  PCP: Pleas Koch, NP  Admit date: 04/04/2021 Discharge date: 05/10/2021  Recommendations for Outpatient Follow-up:   (include homehealth, outpatient follow-up instructions, specific recommendations for PCP to follow-up on, etc.)   Follow-up Information     Ramiya Delahunty, [name] Bruce, MD. Go on 05/05/2021.   Specialty: General Surgery Why: at 11am.  Please arrive 15 minutes prior to your appointment time.  Thank you. Contact information: [place] [address] [phone]         Surgery, [facility]. Go on 05/31/2021.   Specialty: General Surgery Why: at 9:10am with Dr. Kieth Brightly.  Please arrive 15 minutes prior to your appointment time.  Thank you. Contact information: [place] [address] [phone]                Discharge Diagnoses:  Principal Problem:   Morbid (severe) obesity due to excess calories [name]   Surgical Procedure: Roux-en-Y gastric bypass, upper endoscopy  Discharge Condition: Good Disposition: Home  Diet recommendation: Postoperative sleeve gastrectomy diet (liquids only)  Filed Weights   04/04/21 0519  Weight: (!) 152.2 kg     Hospital Course:  The patient was admitted after undergoing Roux-en-Y gastric bypass. POD 0 she ambulated well. POD 1 she was started on the water diet protocol and tolerated 400 ml in the first shift. Once meeting the water amount she was advanced to bariatric protein shakes which they tolerated and were discharged home POD 1.  Treatments: surgery: Roux-en-Y gastric bypass  Discharge Instructions  Discharge Instructions     Ambulate hourly while awake   Complete by: As directed    Call MD for:  difficulty breathing, headache or visual disturbances   Complete by: As directed    Call MD for:  persistant dizziness or light-headedness   Complete by: As directed     Call MD for:  persistant nausea and vomiting   Complete by: As directed    Call MD for:  redness, tenderness, or signs of infection (pain, swelling, redness, odor or green/yellow discharge around incision site)   Complete by: As directed    Call MD for:  severe uncontrolled pain   Complete by: As directed    Call MD for:  temperature >101 F   Complete by: As directed    Diet bariatric full liquid   Complete by: As directed    Discharge wound care:   Complete by: As directed    Remove Bandaids tomorrow, ok to shower tomorrow. Steristrips may fall off in 1-3 weeks.   Incentive spirometry   Complete by: As directed    Perform hourly while awake      Allergies as of 04/05/2021       Reactions   Diphenhydramine Hcl Rash   minor rash   Penicillins Rash        Medication List     STOP taking these medications    celecoxib 100 MG capsule Commonly known as: CELEBREX       TAKE these medications    acetaminophen 650 MG CR tablet Commonly known as: TYLENOL Take 1,300 mg by mouth every 8 (eight) hours as needed for pain.   atorvastatin 10 MG tablet Commonly known as: LIPITOR TAKE 1 TABLET BY MOUTH DAILY. FOR CHOLESTEROL   Calcium-Magnesium-Zinc-D3 Tabs Take 2 tablets by mouth daily.   cetirizine 10 MG tablet Commonly known as: ZYRTEC TAKE 1  TABLET (10 MG TOTAL) BY MOUTH DAILY. FOR ALLERGIES   citalopram 40 MG tablet Commonly known as: CELEXA Take 40 mg by mouth daily.   DULoxetine 30 MG capsule Commonly known as: CYMBALTA Take 30 mg by mouth daily. Take with 60 mg to equal 90 mg daily   DULoxetine 60 MG capsule Commonly known as: CYMBALTA Take 60 mg by mouth daily. Take with 30 mg to equal 90 mg daily   folic acid 1 MG tablet Commonly known as: FOLVITE Take 1 mg by mouth daily.   gabapentin 300 MG capsule Commonly known as: NEURONTIN TAKE 1 CAPSULE BY MOUTH 3 TIMES A DAY AS NEEDED FOR BACK PAIN   lamoTRIgine 200 MG tablet Commonly known as:  LAMICTAL Take 200 mg by mouth in the morning.   lamoTRIgine 100 MG tablet Commonly known as: LAMICTAL Take 100 mg by mouth every evening.   lisinopril 30 MG tablet Commonly known as: ZESTRIL TAKE 1 TABLET (30 MG TOTAL) BY MOUTH DAILY. FOR BLOOD PRESSURE Notes to patient: Monitor Blood Pressure Daily and keep a log for primary care physician.  You may need to make changes to your medications with rapid weight loss.     metFORMIN 500 MG 24 hr tablet Commonly known as: GLUCOPHAGE-XR TAKE 2 TABLETS (1,000 MG TOTAL) BY MOUTH DAILY WITH BREAKFAST. FOR DIABETES. Notes to patient: Monitor Blood Sugar Frequently and keep a log for primary care physician, you may need to adjust medication dosage with rapid weight loss.     metroNIDAZOLE 0.75 % cream Commonly known as: METROCREAM Apply 1 application topically daily as needed (rosacea).   ondansetron 4 MG disintegrating tablet Commonly known as: ZOFRAN-ODT Dissolve 1 tablet (4 mg total) by mouth every 6 (six) hours as needed for nausea or vomiting.   pantoprazole 40 MG tablet Commonly known as: PROTONIX Take 1 tablet (40 mg total) by mouth daily.   traZODone 50 MG tablet Commonly known as: DESYREL Take 50 mg by mouth at bedtime as needed for sleep.   Vitamin D (Ergocalciferol) 1.25 MG (50000 UNIT) Caps capsule Commonly known as: DRISDOL TAKE 1 CAPSULE BY MOUTH ONCE WEEKLY FOR VITAMIN D.       ASK your doctor about these medications    acetaminophen 500 MG tablet Commonly known as: TYLENOL Take 2 tablets (1,000 mg total) by mouth every 8 (eight) hours for 5 days. Ask about: Should I take this medication?               Discharge Care Instructions  (From admission, onward)           Start     Ordered   04/05/21 0000  Discharge wound care:       Comments: Remove Bandaids tomorrow, ok to shower tomorrow. Steristrips may fall off in 1-3 weeks.   04/05/21 0850            Follow-up Information     Lynette Noah, [name] Bruce, MD. Go on 05/05/2021.   Specialty: General Surgery Why: at 11am.  Please arrive 15 minutes prior to your appointment time.  Thank you. Contact information: [place] [address] [phone]         Surgery, [facility]. Go on 05/31/2021.   Specialty: General Surgery Why: at 9:10am with Dr. Kieth Brightly.  Please arrive 15 minutes prior to your appointment time.  Thank you. Contact information: [place] [address] [phone]  The results of significant diagnostics from this hospitalization (including imaging, microbiology, ancillary and laboratory) are listed below for reference.    Significant Diagnostic Studies: No results found.  Labs: Basic Metabolic Panel: No results for input(s): NA, K, CL, CO2, GLUCOSE, BUN, CREATININE, CALCIUM, MG, PHOS in the last 168 hours. Liver Function Tests: No results for input(s): AST, ALT, ALKPHOS, BILITOT, PROT, ALBUMIN in the last 168 hours.  CBC: No results for input(s): WBC, NEUTROABS, HGB, HCT, MCV, PLT in the last 168 hours.  CBG: No results for input(s): GLUCAP in the last 168 hours.  Principal Problem:   Morbid (severe) obesity due to excess calories ([place])   VTE plan: no chemical prophylaxis recommended ([email or web address])  Time coordinating discharge: 15 min

## 2021-05-11 ENCOUNTER — Ambulatory Visit (INDEPENDENT_AMBULATORY_CARE_PROVIDER_SITE_OTHER): Payer: BC Managed Care – PPO | Admitting: Psychology

## 2021-05-11 DIAGNOSIS — F3289 Other specified depressive episodes: Secondary | ICD-10-CM
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
## 2021-05-11 NOTE — Progress Notes (Signed)
Error

## 2021-05-15 NOTE — Progress Notes (Deleted)
Cardiology Office Note  Date:  05/15/2021   ID:  Brandy [name], DOB 1965/12/11, MRN [id number]  PCP:  Doreene Nest, NP   No chief complaint on file.   HPI:  [name] is a 56 year old woman with  morbid obesity,  hypertension,  history of anxiety Sleep study negative for sleep apnea.  Diabetes type 2, hemoglobin A1c greater than 8 Who presents for follow-up of her tachycardia and HTN.    Presenting today in a wheelchair Out of work since 2019 Over the past year has gained weight, very sedentary Reports that she is always falling asleep during the daytime Did sleep study, was told she did not have sleep apnea or narcolepsy  Seen by psychiatry Started on trazodone, 1/2 pill Gets good sleep, goes to bed a 1 Am Still tired in the day  Does not walk far,  has some joint pain Limited by weight, conditioning does physical therapy twice a week  Reports having some stress/anxiety at home, son with disability  Recent studies Normal LE ABIs in 2020  Lab work reviewed Hba1C: 8.9 ,  EKG personally reviewed by myself on todays visit Shows normal sinus rhythm with rate 98 bpm poor R wave progression to the anterior precordial leads   PMH:   has a past medical history of Anxiety, Arthritis, Back pain, Brain fog, CHF (congestive heart failure) (HCC), Depression, Diabetes mellitus without complication (HCC), Difficulty walking, Dysmetabolic syndrome X, Fatigue, Fibromyalgia, Hypertension, Hypertriglyceridemia, Joint pain, Leg weakness, Lower extremity edema, Muscle atrophy, Neuropathy, Obesity, Osteoarthritis, PONV (postoperative nausea and vomiting), Poor memory, Post-menopausal bleeding, Prediabetes, Rosacea, Sleep apnea, Tetanus vaccine causing adverse effect in therapeutic use, and Vitamin D deficiency.  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     atorvastatin (LIPITOR) 10 MG tablet TAKE 1 TABLET BY MOUTH DAILY. FOR CHOLESTEROL 90 tablet 1   cetirizine (ZYRTEC) 10 MG tablet TAKE 1 TABLET (10 MG TOTAL) BY MOUTH DAILY. FOR ALLERGIES 90 tablet 1   citalopram (CELEXA) 40 MG tablet Take 40 mg by mouth daily.     DULoxetine (CYMBALTA) 30 MG capsule Take 30 mg by mouth daily. Take with 60 mg to equal 90 mg daily     DULoxetine (CYMBALTA) 60 MG capsule Take 60 mg by mouth daily. Take with 30 mg to equal 90 mg daily     folic acid (FOLVITE) 1 MG tablet Take 1 mg by mouth daily.     gabapentin (NEURONTIN) 300 MG capsule TAKE 1 CAPSULE BY MOUTH 3 TIMES A DAY AS NEEDED FOR BACK PAIN 270 capsule 3   glipiZIDE (GLUCOTROL) 10 MG tablet TAKE 1 TABLET (10 MG TOTAL) BY MOUTH 2 (TWO) TIMES DAILY BEFORE A MEAL. FOR DIABETES. 180 tablet 0  lamoTRIgine (LAMICTAL) 100 MG tablet Take 100 mg by mouth every evening.     lamoTRIgine (LAMICTAL) 200 MG tablet Take 200 mg by mouth in the morning.     lisinopril (ZESTRIL) 30 MG tablet TAKE 1 TABLET (30 MG TOTAL) BY MOUTH DAILY. FOR BLOOD PRESSURE 90 tablet 3   metFORMIN (GLUCOPHAGE-XR) 500 MG 24 hr tablet TAKE 2 TABLETS (1,000 MG TOTAL) BY MOUTH DAILY WITH BREAKFAST. FOR DIABETES. 180 tablet 3   metroNIDAZOLE (METROCREAM) 0.75 % cream Apply 1 application topically daily as needed (rosacea).     Multiple  Minerals-Vitamins (CALCIUM-MAGNESIUM-ZINC-D3) TABS Take 2 tablets by mouth daily.     ondansetron (ZOFRAN-ODT) 4 MG disintegrating tablet Dissolve 1 tablet (4 mg total) by mouth every 6 (six) hours as needed for nausea or vomiting. 20 tablet 0   pantoprazole (PROTONIX) 40 MG tablet Take 1 tablet (40 mg total) by mouth daily. 90 tablet 0   traZODone (DESYREL) 50 MG tablet Take 50 mg by mouth at bedtime as needed for sleep.     Vitamin D, Ergocalciferol, (DRISDOL) 1.25 MG (50000 UNIT) CAPS capsule TAKE 1 CAPSULE BY MOUTH ONCE WEEKLY FOR VITAMIN D. 12 capsule 1   No current facility-administered medications for this visit.     Allergies:   Diphenhydramine hcl and Penicillins   Social History:  The patient  reports that she has never smoked. She has never used smokeless tobacco. She reports that she does not currently use alcohol. She reports that she does not use drugs.   Family History:   family history includes Alcohol abuse in her maternal grandfather and maternal grandmother; Alcoholism in her father; Aneurysm in her paternal grandmother; Anxiety disorder in her mother; Autism in her son; COPD in her mother; Depression in her father and mother; Diabetes in her paternal aunt; Eating disorder in her mother; Heart disease in her maternal grandmother; Hypertension in her father; Obesity in her mother; Rheum arthritis in her mother; Stroke in her maternal grandmother and mother.    Review of Systems: Review of Systems  Constitutional:  Positive for malaise/fatigue.  HENT: Negative.    Respiratory:  Positive for shortness of breath.   Cardiovascular: Negative.   Gastrointestinal: Negative.   Musculoskeletal: Negative.   Neurological: Negative.   Psychiatric/Behavioral: Negative.    All other systems reviewed and are negative.   PHYSICAL EXAM: VS:  LMP  (LMP Unknown)  , BMI There is no height or weight on file to calculate BMI. GEN: Well nourished, well developed, in no acute distress,  morbidly obese HEENT: normal Neck: no JVD, carotid bruits, or masses Cardiac: RRR; no murmurs, rubs, or gallops,no edema  Respiratory:  clear to auscultation bilaterally, normal work of breathing GI: soft, nontender, nondistended, + BS MS: no deformity or atrophy Skin: warm and dry, no rash Neuro:  Strength and sensation are intact Psych: euthymic mood, full affect   Recent Labs: 10/04/2020: TSH 2.51 04/05/2021: ALT 18; BUN 12; Creatinine, Ser 0.79; Hemoglobin 11.8; Platelets 288; Potassium 4.3; Sodium 133    Lipid Panel Lab Results  Component Value Date   CHOL 137 06/01/2020   HDL 57.30 06/01/2020   LDLCALC 49 06/01/2020   TRIG 152.0 (H) 06/01/2020    Wt Readings from Last 3 Encounters:  05/10/21 (!) 314 lb (142.4 kg)  04/17/21 (!) 332 lb 9.6 oz (150.9 kg)  04/04/21 (!) 335 lb 8 oz (152.2 kg)      ASSESSMENT AND PLAN:  Problem List Items Addressed This Visit   None  Fatigue/shortness of breath Likely multifactorial including morbid obesity, deconditioning EKG essentially benign, normal blood pressure Very sedentary Discussed strategies to get her more active, weight loss  Diastolic CHF Improved leg edema on HCTZ Appears relatively euvolemic, stressed importance of weight loss  Morbid obesity Recommend referral to dietitian Also recommended [facility] program for conditioning and weight loss  Type 2 diabetes Hemoglobin A1c elevated greater than 8 Suggested dietitian and rehab as above  Hypertension Blood pressure is well controlled on today's visit. No changes made to the medications.  Hyperlipidemia We will hold the Lipitor for now given muscle and joint ache If no improvement after 1 or 2 months, could restart the medication     Total encounter time more than 60 minutes  Greater than 50% was spent in counseling and coordination of care with the patient  Patient was seen in consultation for Vernona Rieger and will be referred back to her office for  ongoing care of the issues detailed above   Signed, Dossie Arbour, M.D., Ph.D. [facility] [place], [place] [phone]

## 2021-05-16 ENCOUNTER — Ambulatory Visit: Payer: BC Managed Care – PPO | Admitting: Cardiovascular Disease

## 2021-05-16 DIAGNOSIS — I5032 Chronic diastolic (congestive) heart failure: Secondary | ICD-10-CM

## 2021-05-16 DIAGNOSIS — I10 Essential (primary) hypertension: Secondary | ICD-10-CM

## 2021-05-16 DIAGNOSIS — E1159 Type 2 diabetes mellitus with other circulatory complications: Secondary | ICD-10-CM

## 2021-05-16 DIAGNOSIS — R0602 Shortness of breath: Secondary | ICD-10-CM

## 2021-05-18 ENCOUNTER — Ambulatory Visit: Payer: BC Managed Care – PPO | Admitting: Psychology

## 2021-05-22 DIAGNOSIS — F39 Unspecified mood [affective] disorder: Secondary | ICD-10-CM | POA: Diagnosis not present

## 2021-05-22 DIAGNOSIS — F5105 Insomnia due to other mental disorder: Secondary | ICD-10-CM | POA: Diagnosis not present

## 2021-05-22 DIAGNOSIS — F411 Generalized anxiety disorder: Secondary | ICD-10-CM | POA: Diagnosis not present

## 2021-05-25 ENCOUNTER — Ambulatory Visit (INDEPENDENT_AMBULATORY_CARE_PROVIDER_SITE_OTHER): Payer: BC Managed Care – PPO | Admitting: Psychology

## 2021-05-25 DIAGNOSIS — F3289 Other specified depressive episodes: Secondary | ICD-10-CM | POA: Diagnosis not present
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
## 2021-06-01 ENCOUNTER — Ambulatory Visit (INDEPENDENT_AMBULATORY_CARE_PROVIDER_SITE_OTHER): Payer: BC Managed Care – PPO | Admitting: Psychology

## 2021-06-01 DIAGNOSIS — F3289 Other specified depressive episodes: Secondary | ICD-10-CM | POA: Diagnosis not present

## 2021-06-01 NOTE — Progress Notes (Signed)
Comprehensive Clinical Assessment (CCA) Note  06/01/2021 Lataya Varnell Bollier [id number]  Time Spent: 12:35  pm - 1:29 pm: 12 Minutes  Chief Complaint: No chief complaint on file.  Visit Diagnosis: Depression.    Guardian/Payee:  self.     Paperwork requested: No   Reason for Visit /Presenting Problem: depression and anxiety.   Mental Status Exam: Appearance:   Fairly Groomed     Behavior:  Appropriate  Motor:  Normal  Speech/Language:   Clear and Coherent and Normal Rate  Affect:  Congruent  Mood:  normal  Thought process:  normal  Thought content:    WNL  Sensory/Perceptual disturbances:    WNL  Orientation:  oriented to person, place, time/date, and situation  Attention:  Good  Concentration:  Good  Memory:  WNL  Fund of knowledge:   Good  Insight:    Good  Judgment:   Good  Impulse Control:  Good   Reported Symptoms:  Depression & anxiety.   Risk Assessment: Danger to Self:  No Self-injurious Behavior: No Danger to Others: No Duty to Warn:no Physical Aggression / Violence:No  Access to Firearms a concern: No  Gang Involvement:No  Patient / guardian was educated about steps to take if suicide or homicide risk level increases between visits: yes While future psychiatric events cannot be accurately predicted, the patient does not currently require acute inpatient psychiatric care and does not currently meet [facility] involuntary commitment criteria.  In case of a mental health emergency:  82 - confidential suicide hotline. [facility]):        [place], [place] [address]       [phone] 3.   911  4.   Visiting Nearest ED.   Substance Abuse History: Current substance abuse: No    Caffeine Use: Hot Cholate, unsweetened tea (not daily) Substance Use: denied.  Alcohol Use: denied. Tobacco Use:  denied.   Past Psychiatric History:   Previous psychological history is significant for anxiety and  depression Outpatient Providers: [name], [facility]. Cephus Shelling, MD (Psychiatry)  History of Psych Hospitalization: No  Psychological Testing:  n/a    Abuse History:  Victim of: No.,  n/a    Report needed: No. Victim of Neglect:No. Perpetrator of  n/a   Witness / Exposure to Domestic Violence: No   Protective Services Involvement: No  Witness to [employer] Violence:  No   Family History:  Family History  Problem Relation Age of Onset   Rheum arthritis Mother    COPD Mother    Stroke Mother    Depression Mother    Anxiety disorder Mother    Eating disorder Mother    Obesity Mother    Hypertension Father    Depression Father    Alcoholism Father    Heart disease Maternal Grandmother    Stroke Maternal Grandmother    Alcohol abuse Maternal Grandmother    Alcohol abuse Maternal Grandfather    Aneurysm Paternal Grandmother    Autism Son        severe   Diabetes Paternal Aunt    Breast cancer Neg Hx     Living situation: the patient lives with their family  Sexual Orientation: Straight  Relationship Status: married  Name of spouse / other: Ed If a parent, number of children / ages: Jeanell Sparrow.  Support Systems: spouse friends  Museum/gallery curator Stress:  Yes   Income/Employment/Disability: No income  Armed forces logistics/support/administrative officer: No  Educational History: Education: some college  Religion/Sprituality/World View: Believes in God  Any cultural differences that may affect / interfere with treatment:  not applicable   Recreation/Hobbies: TBD  Stressors: Financial difficulties   Health problems   Marital or family conflict    Strengths: Supportive Relationships, Conservator, museum/gallery, and Able to Communicate Effectively  Barriers:  Mood, finances.    Legal History: Pending legal issue / charges: The patient has no significant history of legal issues. History of legal issue / charges:  n/a  Medical History/Surgical History: reviewed Past  Medical History:  Diagnosis Date   Anxiety    Arthritis    Back pain    Brain fog    CHF (congestive heart failure) ([place])    pt not aware of this   Depression    Diabetes mellitus without complication (HCC)    Difficulty walking    Dysmetabolic syndrome X    Fatigue    Fibromyalgia    neuropathy   Hypertension    Hypertriglyceridemia    Joint pain    Leg weakness    Lower extremity edema    Muscle atrophy    Neuropathy    Obesity    Osteoarthritis    PONV (postoperative nausea and vomiting)    Poor memory    Post-menopausal bleeding    Prediabetes    Rosacea    Sleep apnea    not on cpap yet   Tetanus vaccine causing adverse effect in therapeutic use    Vitamin D deficiency     Past Surgical History:  Procedure Laterality Date   BONE TUMOR EXCISION  1975   Right leg   DILATATION & CURETTAGE/HYSTEROSCOPY WITH MYOSURE N/A 10/07/2019   Procedure: DILATATION AND CURETTAGE /HYSTEROSCOPY, Myosure, Polypectomy;  Surgeon: Aletha Halim, MD;  Location: [facility];  Service: Gynecology;  Laterality: N/A;   ENDOMETRIAL BIOPSY  07/14/2019       epidural steroid injection     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     atorvastatin (LIPITOR) 10 MG tablet TAKE 1 TABLET BY MOUTH DAILY. FOR CHOLESTEROL 90 tablet 1   cetirizine (ZYRTEC) 10 MG tablet TAKE 1 TABLET (10 MG TOTAL) BY MOUTH DAILY. FOR ALLERGIES 90 tablet 1   citalopram (CELEXA) 40 MG tablet Take 40 mg by mouth daily.     DULoxetine (CYMBALTA) 30 MG capsule Take 30 mg by mouth daily. Take with 60 mg to equal 90 mg daily     DULoxetine (CYMBALTA) 60 MG capsule Take 60 mg by mouth  daily. Take with 30 mg to equal 90 mg daily     folic acid (FOLVITE) 1 MG tablet Take 1 mg by mouth daily.     gabapentin (NEURONTIN) 300 MG capsule TAKE 1 CAPSULE BY MOUTH 3 TIMES A DAY AS NEEDED FOR BACK PAIN 270 capsule 3   glipiZIDE (GLUCOTROL) 10 MG tablet TAKE 1 TABLET (10 MG TOTAL) BY MOUTH 2 (TWO) TIMES DAILY BEFORE A MEAL. FOR DIABETES. 180 tablet 0   lamoTRIgine (LAMICTAL) 100 MG tablet Take 100 mg by mouth every evening.     lamoTRIgine (LAMICTAL) 200 MG tablet Take 200 mg by mouth in the  morning.     lisinopril (ZESTRIL) 30 MG tablet TAKE 1 TABLET (30 MG TOTAL) BY MOUTH DAILY. FOR BLOOD PRESSURE 90 tablet 3   metFORMIN (GLUCOPHAGE-XR) 500 MG 24 hr tablet TAKE 2 TABLETS (1,000 MG TOTAL) BY MOUTH DAILY WITH BREAKFAST. FOR DIABETES. 180 tablet 3   metroNIDAZOLE (METROCREAM) 0.75 % cream Apply 1 application topically daily as needed (rosacea).     Multiple Minerals-Vitamins (CALCIUM-MAGNESIUM-ZINC-D3) TABS Take 2 tablets by mouth daily.     ondansetron (ZOFRAN-ODT) 4 MG disintegrating tablet Dissolve 1 tablet (4 mg total) by mouth every 6 (six) hours as needed for nausea or vomiting. 20 tablet 0   pantoprazole (PROTONIX) 40 MG tablet Take 1 tablet (40 mg total) by mouth daily. 90 tablet 0   traZODone (DESYREL) 50 MG tablet Take 50 mg by mouth at bedtime as needed for sleep.     Vitamin D, Ergocalciferol, (DRISDOL) 1.25 MG (50000 UNIT) CAPS capsule TAKE 1 CAPSULE BY MOUTH ONCE WEEKLY FOR VITAMIN D. 12 capsule 1   No current facility-administered medications for this visit.    Allergies  Allergen Reactions   Diphenhydramine Hcl Rash    minor rash   Penicillins Rash    Diagnoses:  Other depression  Depression screen [facility] 2/9 06/01/2021 01/09/2021 10/04/2020 02/08/2020 08/25/2019  Decreased Interest 2 0 0 0 3  Down, Depressed, Hopeless 1 0 '3 3 3  ' PHQ - 2 Score 3 0 '3 3 6  ' Altered sleeping 3 - 0 3 3  Tired, decreased energy 3 - 3 0 3  Change in appetite 0 - 0 0 3  Feeling bad or failure  about yourself  2 - 3 0 2  Trouble concentrating 0 - 0 0 3  Moving slowly or fidgety/restless 2 - 0 0 3  Suicidal thoughts 0 - 0 0 2  PHQ-9 Score 13 - '9 6 25  ' Difficult doing work/chores - - Not difficult at all - -  Some recent data might be hidden   GAD 7 : Generalized Anxiety Score 06/01/2021 03/17/2018  Nervous, Anxious, on Edge 0 3  Control/stop worrying 1 3  Worry too much - different things 1 3  Trouble relaxing 0 1  Restless 3 1  Easily annoyed or irritable 1 1  Afraid - awful might happen 1 3  Total GAD 7 Score 7 15  Anxiety Difficulty Not difficult at all Somewhat difficult     Plan of Care: Outpatient Therapy and psychiatric treatment.   Narrative:   Deby Adger Parcell participated from car, via video, and consented to treatment. Therapist participated from home office. We met online due to [place] pandemic.  This is Teegan's annual reassessment.  Jimeka is receiving psychiatric services from Dr. Cephus Shelling, MD, local psychiatrist who has been treating her for some time.  Eldred noted taking her medication consistently and without complaint she visits with Dr. Nicolasa Ducking regularly for psychiatric check ins and medication adjustments were appropriate.  Avian has missed some progress in treatment in the past year but continues to struggle with depressive symptoms, anxiety, negative self talk, and continues to exhibit cognitive distortions.  She is experiencing interpersonal stressors primarily with her husband of 30 years.  She has been adjusting to numerous transitions this past year including a recent bariatric surgery results: Reasonably well overall.  She appears to be compliant with her eating regimen but noted the need to process her eating going forward in regards to managing it in a healthful and consistent manner.  She  has lost around 50 pounds since Thanksgiving of 2022.  Terasa would benefit from focusing on addressing her overall mood, setting boundaries for self and others, processing past  events, and developing a consistent self-care routine.  Some of the barriers to her goals include her health, mental health, and overall difficulty with ambulation.  She noted her own goals of focusing more on self, and addressing her "core issues".  Cambell has been compliant with treatment both psychiatrically and psychologically.  She attends sessions regularly and continues to participate.  She has made strides in the past year and would benefit from continued weekly counseling in conjunction with her psychiatric treatment.  During evaluation the GAD-7 and PHQ-9 were not completed, with a score of 13 and 7, respectively.  She denied any suicidal ideation but does have a history of passive thoughts.  We have previously developed a safety plan that includes contacting 911, 988, and visiting nearest ED.  Remona has made strides in her mindfulness and self-awareness.  She has become more flexible during treatment and often considers approaches or suggestions that previously were not considered.  [place] we worked on identifying her treatment goals, between sessions, to be reviewed during our follow-up.    [name], LCSW

## 2021-06-05 ENCOUNTER — Encounter: Payer: Self-pay | Admitting: Dietician

## 2021-06-05 ENCOUNTER — Encounter: Payer: BC Managed Care – PPO | Attending: General Surgery | Admitting: Dietician

## 2021-06-05 ENCOUNTER — Other Ambulatory Visit: Payer: Self-pay

## 2021-06-05 VITALS — Ht 66.0 in | Wt 299.4 lb

## 2021-06-05 DIAGNOSIS — E1165 Type 2 diabetes mellitus with hyperglycemia: Secondary | ICD-10-CM | POA: Diagnosis not present

## 2021-06-05 DIAGNOSIS — Z6841 Body Mass Index (BMI) 40.0 and over, adult: Secondary | ICD-10-CM | POA: Insufficient documentation

## 2021-06-05 NOTE — Progress Notes (Signed)
Nutrition Therapy for Post-Operative Bariatric Diet Follow-up visit:  2 months post-op RnY Gastric Bypass Surgery  Medical Nutrition Therapy:  Appt start time: 1600   end time:  1645  Anthropometrics:  Date 03/17/21 04/17/21 06/05/21  BMI 56.7 53.7 48.3  Weight (lbs) 351.2 332.6 299.4  Skeletal muscle (lbs) 82.5 76.7 72.3  % body fat 58.2 58.6 56.1   Clinical: Medications: atorvastatin, cetirizine, citalopram, DULoxetine, gabapentin, glipiZIDE, lamoTRIgine, lisinopril, metFOMRIN, pantoprazole, traZODone Supplementation: women's daily multivatimin 3 daily (got wrong type) with 500mg  calcium Health/ medical history changes: no GI symptoms: N/V/D/C: constipation 2 episodes, takes miralax prn; has had a few days of nausea, 3 episodes of vomiting since surgery pt feels maybe due to unintentional higher fat foods Dumping Syndrome: no Hair loss: none abnormal  Dietary/ Lifestyle Progress: Patient reports some intermittent upper abdominal pain for the past several weeks; has restarted pantoprazole, which is helping; pain is gradually resolving.  She has had 2 episodes of vomiting since previous visit, she feels likely due to some higher fat foods (soup at restaurant, ground beef which son bought and cooked and did not drain) She is having difficulty consuming protein shakes, sometimes able to drink up to 1/2 of one shake. She is gradually increasing physical activity, mostly on her driveway, and is able to increase duration/ distance slowly.  Dietary recall: Eating pattern: 2-3 meals and 2 snacks daily Dining out:  0-1 Breakfast: yogurt, protein shake maybe 1/2 Snack: none or same as pm  Lunch: deli meat and cheese with bell pepper/ cucumber;   Snack: 2-4 small pieces cheese  Dinner: roast beef with carrots, small amt onion  Snack: yogurt 1/2 of 5oz container  Fluid intake: water, crystal light diluted, cran -grape diet juice drink; patient unsure of total fluid amount Estimated total  protein intake: 50-60g daily Bariatric diet adherence:  Using straws: yes Drinking fluids during meals: no Carbonated beverages: no  Recent physical activity:  walking on driveway 2 "laps" dailly, gradually increasing   Nutrition Intervention:   Reviewed progress since previous visit. Discussed options for ensuring adequate daily protein, including liquid and solid options. Instructed on advancing diet to include high fiber starchy vegetables such as peas or sweet potato, especially if blended with low carb vegetables.  Discussed option of smoothie per patient request, advised no more than 1 small portion fruit, low carb vegetable, sugar free liquid, and 15-30g supplemental protein or yogurt.  Discussed vitamin supplementation and appropriate options to meet post-op needs.  Nutritional Diagnosis:  [place]-3.3 Overweight/obesity As related to history of excess calories, inadequate physical activity.  As evidenced by patient with current BMI of 48.32, following bariatric diet guidelines for ongoing weight loss after gastric bypass surgery.  Teaching Method Utilized:  Visual Auditory Hands on  Materials provided: Phase 5 bariatric diet handout   Learning Readiness:  Change in progress  Barriers to learning/adherence to lifestyle change: none  Demonstrated degree of understanding via:  Teach Back      Plan: Return for MNT follow up on about 10/03/21

## 2021-06-05 NOTE — Patient Instructions (Signed)
Monitor protein intake and aim for at least 60grams daily. If you try a smoothie, keep to 1 small serving of fruit and combine with protein powder and/or [place] yogurt, and add a low carb vegetable such as spinach, kale, or cucumber, + sugar free liquid. Continue to include low carb vegetables after eating 1-2oz of a protein food; ok to add in some sweet potato or peas with the vegetables.

## 2021-06-08 ENCOUNTER — Ambulatory Visit (INDEPENDENT_AMBULATORY_CARE_PROVIDER_SITE_OTHER): Payer: BC Managed Care – PPO | Admitting: Psychology

## 2021-06-08 DIAGNOSIS — F339 Major depressive disorder, recurrent, unspecified: Secondary | ICD-10-CM | POA: Diagnosis not present

## 2021-06-08 NOTE — Progress Notes (Signed)
[facility] Counselor/Therapist Progress Note  Patient ID: Brandy [name], MRN: [id number]    Date: 06/08/21  Time Spent: 12:36 pm - 1:34 pm: 58 Minutes  Treatment Type: Individual Therapy.  Reported Symptoms: Anxiety   Mental Status Exam: Appearance:  Fairly Groomed     Behavior: Appropriate  Motor: Normal  Speech/Language:  Clear and Coherent and Normal Rate  Affect: Appropriate  Mood: normal  Thought process: normal  Thought content:   WNL  Sensory/Perceptual disturbances:   WNL  Orientation: oriented to person, place, time/date, and situation  Attention: Good  Concentration: Good  Memory: WNL  Fund of knowledge:  Good  Insight:   Good  Judgment:  Good  Impulse Control: Good   Risk Assessment: Danger to Self:  No Self-injurious Behavior: No Danger to Others: No Duty to Warn:no Physical Aggression / Violence:No  Access to Firearms a concern: No  Gang Involvement:No   Subjective:   Brandy [name] participated car, via video, and consented to treatment. Therapist participated from office. We met online due to Brandy [name]. Brandy [name] reviewed the events of the past week. We reviewed numerous treatment approaches including CBT, BA, Problem Solving, and Solution focused therapy. Psych-education regarding the Brandy [name]'s diagnosis of Episode of recurrent major depressive disorder, unspecified depression episode severity ([place]) was provided during the session. We discussed Brandy [name]'s goals treatment goals which include managing her mood and symptoms, processing past events, focus on self-care (exercise, healthful eating), set boundaries for self and others, decrease negative self-talk, reduce cognitive rigidity, & decrease rumination. Brandy [name] provided verbal approval of the treatment plan.   Interventions: Psycho-education & Goal Setting.   Interventions: Interpersonal.  Diagnosis: Episode of recurrent major depressive disorder, unspecified  depression episode severity ([place])   Treatment Plan:  [name], forthcoming, self-aware, and motivated for change.   Support System: Friend Brandy [name]) and Family.   Client Treatment Preferences Outpatient Therapy.  Client Statement of Needs Brandy [name]'s goals for treatment managing her mood and symptoms, processing past events, focus on self-care, set boundaries for self and others, decrease negative self-talk, decrease rumination, process relationship stressors, and improve health.   Treatment Level Weekly  Symptoms  Depressive symptoms: Decreased interest, feeling down, poor sleep, lethargy, psycho-motor retardation, feeling bad about self.  (Status: maintained)  Anxiety: Difficulty managing worry, worrying about different things, restlessness, irritability, afraid something awful might happen  (Status: maintained)  Goals:  Brandy [name] experiences symptoms of depression and anxiety.   Target Date: 2022-06-08 Frequency: Weekly  Progress: 0 Modality: individual    Therapist will provide referrals for additional resources as appropriate.  Therapist will provide psycho-education regarding Brandy [name]'s diagnosis and corresponding treatment approaches and interventions. Licensed Clinical Social Worker, [place], LCSW will support the patient's ability to achieve the goals identified. will employ CBT, BA, Problem-solving, Solution Focused, Mindfulness,  coping skills, & other evidenced-based practices will be used to promote progress towards healthy functioning to help manage decrease symptoms associated with her diagnosis.   Reduce overall level, frequency, and intensity of the feelings of depression and anxiety as evidenced by decreased overall symptoms of depression and anxiety from 6 to 7 days/week to 0 to 1 days/week per client report for at least 3 consecutive months. Verbally express understanding of the relationship between feelings of depression, anxiety  and their impact on thinking patterns and behaviors. Verbalize an understanding of the role that distorted thinking plays in creating fears, excessive worry, and ruminations.  Brandy [name] participated in  the creation of the treatment plan)    [name], LCSW

## 2021-06-09 ENCOUNTER — Telehealth: Payer: Self-pay | Admitting: Dietician

## 2021-06-09 NOTE — Telephone Encounter (Signed)
Called and left message for patient with possible dates and times for 6 month post-op MNT visit. Requested a call back.

## 2021-06-12 ENCOUNTER — Ambulatory Visit: Payer: BC Managed Care – PPO | Admitting: Gastroenterology

## 2021-06-14 ENCOUNTER — Ambulatory Visit: Payer: BC Managed Care – PPO | Admitting: Cardiovascular Disease

## 2021-06-15 ENCOUNTER — Ambulatory Visit: Payer: BC Managed Care – PPO | Admitting: Psychology

## 2021-06-15 ENCOUNTER — Telehealth: Payer: Self-pay

## 2021-06-15 NOTE — Telephone Encounter (Signed)
I spoke with pt; pt had bariatric surgery on 04/04/22. Pt did not go to be seen last night but did speak with bariatric dr. Rock Nephew has not heard from bariatric office this morning with further instructions. Pt said she has been vomiting this morning; pt has abd pain and dry mouth but no dizziness. Pt has S/T that really hurts when swallow, pt is not sure if fever or not; pt had dry cough, bodyaches, runny nose and no H/A today but did have H/A on 06/14/21. Pt does not have diarrhea. Pt has not tested with home covid test. Concerned with possible dehydration advised pt to go to ED for eval and possible testing and IV fluids if needed. Pt said she is going to talk with her family and is going to call bariatric office back to see if they give IV fluids. Pt said she will let [facility] know what she decides to do. Again ED precautions given and pt voiced understanding. Sending note to Allayne Gitelman NP and [facility] CMA.

## 2021-06-15 NOTE — Telephone Encounter (Signed)
Called patient to verify how she is feeling. She has been able to hold down some water today but if not better she will go in the morning.

## 2021-06-15 NOTE — Telephone Encounter (Signed)
[facility] - Client TELEPHONE ADVICE RECORD [facility] Patient Name: Brandy [name] Gender: Female DOB: February 21, 1966 Age: 56 Y 11 M 9 D Return Phone Number: [phone] (Primary) Address: City/ State/ Zip: [place]  [address] Client [facility] - Client Client Site [facility] - Night Provider Alma Friendly - NP Contact Type Call Who Is Calling Patient / Member / Family / Caregiver Call Type Triage / Clinical Relationship To Patient Self Return Phone Number [phone] (Primary) Chief Complaint Vomiting Reason for Call Symptomatic / Request for [place] states that she has had a sore throat for 2 days, bariatric surgery in dec. she is unable to keep anything down. Translation No Nurse Assessment Nurse: Doren Custard, RN, Caryl Pina Date/Time (Eastern Time): 06/14/2021 7:05:02 PM Confirm and document reason for call. If symptomatic, describe symptoms. ---Caller states she has had a sore throat for 2 days. She is having upper abdominal pain. She had bariatric surgery in December. She has vomited x4 today. Does the patient have any new or worsening symptoms? ---Yes Will a triage be completed? ---Yes Related visit to physician within the last 2 weeks? ---No Does the PT have any chronic conditions? (i.e. diabetes, asthma, this includes High risk factors for pregnancy, etc.) ---Yes List chronic conditions. ---obesity, diabetes, HTN, depression, anxiety, fibromyalgia Is this a behavioral health or substance abuse call? ---No Guidelines Guideline Title Affirmed Question Affirmed Notes Nurse Date/Time (Eastern Time) Sore Throat [1] Sore throat is the only symptom AND [2] present > 48 hours Dixon, RN, Caryl Pina 06/14/2021 7:07:13 PM Vomiting High-risk adult (e.g., diabetes mellitus, brain tumor, V-P shunt, hernia) Doren Custard, RN, Caryl Pina 06/14/2021 7:12:39 PM PLEASE  NOTE: All timestamps contained within this report are represented as [place] Standard Time. CONFIDENTIALTY NOTICE: This fax transmission is intended only for the addressee. It contains information that is legally privileged, confidential or otherwise protected from use or disclosure. If you are not the intended recipient, you are strictly prohibited from reviewing, disclosing, copying using or disseminating any of this information or taking any action in reliance on or regarding this information. If you have received this fax in error, please notify us immediately by telephone so that we can arrange for its return to [place]. Phone: [phone], Toll-Free: [phone], Fax: [phone] Page: 2 of 2 Call Id: [id number] [place]. Time Eilene Ghazi Time) Disposition Final User 06/14/2021 6:39:59 PM Attempt made - message left Doren Custard, RNCaryl Pina 06/14/2021 6:50:58 PM FINAL ATTEMPT MADE - message left Sydell Axon 06/14/2021 7:12:23 PM SEE PCP WITHIN 3 DAYS Doren Custard, RN, Caryl Pina 06/14/2021 7:19:46 PM Go to ED Now (or PCP triage) Yes Doren Custard, RN, Hulan Saas Disagree/Comply Comply Caller Understands Yes PreDisposition Did not know what to do Care Advice Given Per Guideline SEE PCP WITHIN 3 DAYS: * You need to be seen within 2 or 3 days. * Sip warm chicken broth or apple juice. * Here are some simple things you can do to treat and reduce sore throat pain. SORE THROAT: * Gargle with warm salt water four times a day. To make salt water, put 1/2 teaspoon of salt in 8 oz (240 ml) of warm water. * For pain or fever relief, take either acetaminophen or ibuprofen. PAIN AND FEVER MEDICINES: DRINK PLENTY OF LIQUIDS: * Drink plenty of liquids. It is important to stay wellhydrated. CARE ADVICE given per Sore Throat (Adult) guideline. * You become worse CALL BACK IF: GO TO ED NOW (OR PCP TRIAGE): *  IF NO PCP (PRIMARY CARE PROVIDER) SECOND-LEVEL TRIAGE: You need to be seen within the next hour. Go to the [facility] at _____________  [place] as soon as you can. CARE ADVICE per Vomiting (Adult) guideline. Referrals GO TO FACILITY UNDECIDE

## 2021-06-15 NOTE — Telephone Encounter (Signed)
Noted, will also evaluate patient as scheduled.

## 2021-06-15 NOTE — Telephone Encounter (Signed)
Agree that she should contact her bariatric doctor's office, also agree with ED evaluation if symptoms persist and she cannot maintain hydration.

## 2021-06-15 NOTE — Telephone Encounter (Signed)
I was unable to speak with pt; I left v/m requesting pt to cb. Sending note to Allayne Gitelman NP,Joellen CMA and lsc triage.

## 2021-06-15 NOTE — Telephone Encounter (Signed)
Pt called back returning your call °

## 2021-06-20 ENCOUNTER — Ambulatory Visit (INDEPENDENT_AMBULATORY_CARE_PROVIDER_SITE_OTHER): Payer: BC Managed Care – PPO | Admitting: Primary Care

## 2021-06-20 ENCOUNTER — Encounter: Payer: Self-pay | Admitting: Primary Care

## 2021-06-20 ENCOUNTER — Other Ambulatory Visit: Payer: Self-pay

## 2021-06-20 VITALS — BP 126/78 | HR 100 | Temp 98.3°F | Ht 66.0 in | Wt 292.0 lb

## 2021-06-20 DIAGNOSIS — M2550 Pain in unspecified joint: Secondary | ICD-10-CM

## 2021-06-20 DIAGNOSIS — I10 Essential (primary) hypertension: Secondary | ICD-10-CM | POA: Diagnosis not present

## 2021-06-20 DIAGNOSIS — M5442 Lumbago with sciatica, left side: Secondary | ICD-10-CM

## 2021-06-20 DIAGNOSIS — E538 Deficiency of other specified B group vitamins: Secondary | ICD-10-CM | POA: Diagnosis not present

## 2021-06-20 DIAGNOSIS — E785 Hyperlipidemia, unspecified: Secondary | ICD-10-CM | POA: Diagnosis not present

## 2021-06-20 DIAGNOSIS — F411 Generalized anxiety disorder: Secondary | ICD-10-CM

## 2021-06-20 DIAGNOSIS — R29898 Other symptoms and signs involving the musculoskeletal system: Secondary | ICD-10-CM

## 2021-06-20 DIAGNOSIS — E1165 Type 2 diabetes mellitus with hyperglycemia: Secondary | ICD-10-CM

## 2021-06-20 DIAGNOSIS — E781 Pure hyperglyceridemia: Secondary | ICD-10-CM

## 2021-06-20 DIAGNOSIS — J029 Acute pharyngitis, unspecified: Secondary | ICD-10-CM

## 2021-06-20 DIAGNOSIS — F330 Major depressive disorder, recurrent, mild: Secondary | ICD-10-CM

## 2021-06-20 DIAGNOSIS — E559 Vitamin D deficiency, unspecified: Secondary | ICD-10-CM | POA: Diagnosis not present

## 2021-06-20 DIAGNOSIS — Z9884 Bariatric surgery status: Secondary | ICD-10-CM | POA: Insufficient documentation

## 2021-06-20 DIAGNOSIS — G8929 Other chronic pain: Secondary | ICD-10-CM

## 2021-06-20 LAB — VITAMIN D 25 HYDROXY (VIT D DEFICIENCY, FRACTURES): VITD: 60.68 ng/mL (ref 30.00–100.00)

## 2021-06-20 LAB — CBC
HCT: 39.3 % (ref 36.0–46.0)
Hemoglobin: 13.1 g/dL (ref 12.0–15.0)
MCHC: 33.4 g/dL (ref 30.0–36.0)
MCV: 90.5 fl (ref 78.0–100.0)
Platelets: 395 10*3/uL (ref 150.0–400.0)
RBC: 4.35 Mil/uL (ref 3.87–5.11)
RDW: 14.2 % (ref 11.5–15.5)
WBC: 10.2 10*3/uL (ref 4.0–10.5)

## 2021-06-20 LAB — VITAMIN B12: Vitamin B-12: >1504 pg/mL — ABNORMAL HIGH (ref 211–911)

## 2021-06-20 LAB — IBC + FERRITIN
Ferritin: 221.9 ng/mL (ref 10.0–291.0)
Iron: 77 ug/dL (ref 42–145)
Saturation Ratios: 24.9 % (ref 20.0–50.0)
TIBC: 309.4 ug/dL (ref 250.0–450.0)
Transferrin: 221 mg/dL (ref 212.0–360.0)

## 2021-06-20 LAB — TSH: TSH: 1.81 u[IU]/mL (ref 0.35–5.50)

## 2021-06-20 LAB — LIPID PANEL
Cholesterol: 166 mg/dL (ref 0–200)
HDL: 40.1 mg/dL (ref >39.00)
LDL Cholesterol: 92 mg/dL (ref 0–99)
NonHDL: 126.19
Total CHOL/HDL Ratio: 4
Triglycerides: 169 mg/dL — ABNORMAL HIGH (ref 0.0–149.0)
VLDL: 33.8 mg/dL (ref 0.0–40.0)

## 2021-06-20 LAB — HEMOGLOBIN A1C: Hgb A1c MFr Bld: 7 % — ABNORMAL HIGH (ref 4.6–6.5)

## 2021-06-20 LAB — POCT RAPID STREP A (OFFICE): Rapid Strep A Screen: NEGATIVE

## 2021-06-20 NOTE — Assessment & Plan Note (Signed)
Repeat vitamin D level pending.  Continue vitamin D 50,000 IUs weekly, and daily vitamin D.

## 2021-06-20 NOTE — Assessment & Plan Note (Signed)
Continue multivitamin per bariatric surgeon.  Repeat B12 level pending.

## 2021-06-20 NOTE — Assessment & Plan Note (Signed)
Controlled.   Continue off lisinopril for now.  Discussed that if her home BP readings continue to run systolic

## 2021-06-20 NOTE — Assessment & Plan Note (Signed)
Controlled. Following with psychiatry.  Continue Lamictal 200 mg in a.m., 100 mg at bedtime. Continue citalopram 40 mg daily, Cymbalta 90 mg daily, trazodone 50 mg as needed.

## 2021-06-20 NOTE — Assessment & Plan Note (Signed)
Improving since weight loss from bariatric surgery.  Encourage daily walking.

## 2021-06-20 NOTE — Assessment & Plan Note (Signed)
Chronic, continued.  Hopefully her weight loss will help to reduce her pain.  Overall she does seem more mobile and active which is reassuring.  Continue gabapentin 300 mg 2-3 times daily. No longer on Celebrex.

## 2021-06-20 NOTE — Progress Notes (Signed)
Subjective:    Patient ID: Brandy [name], female    DOB: [birth year], 56 y.o.   MRN: [id number]  HPI  Brandy [name] is a very pleasant 56 y.o. female with a history of type 2 diabetes, severe obstructive sleep apnea, PAD, hypertension, hyperlipidemia, chronic fatigue, chronic back pain, anxiety and depression, morbid obesity who presents today for follow-up of chronic conditions  1) S/P Bariatric Surgery: S/P bariatric surgery in December 2022 per Dr. Sheliah Hatch. Last office visit was in February 2023. She has lost 74 pounds since June 2022 compared to our scales. Since her surgery she's noticed positive effects including maneuvering around better, getting up and down by herself. She has noticed slight improvement in walking but does experience imbalance with longer walks. She is more involved in her family chores. She is compliant to her multivitamins, pantoprazole 40 mg daily. She uses Zofran as needed.   2) Chronic Back Pain: She continues with chronic lower back pain, about the same. She is no longer in aquatics and regular PT. She does have home PT instructions, plans on resuming soon. Managed on gabapentin 300 mg twice daily for the most part, sometimes three times daily. Feels well managed on this regimen.   3) Anxiety and Depression: Following with psychiatry, Dr. Maryruth Bun, managed on Lamictal 200 mg in AM and 100 mg in PM, Cymbalta 90 mg, citalopram 40 mg, trazodone 50 mg. She hardly takes Trazodone 50 mg as it causes her to feel odd. Overall she feels well managed.  She feels that Lamictal has been a life saver for her.  4) Type 2 Diabetes: Currently managed on Metformin XR 1000 mg daily. Previously managed on glipizide 10 mg BID, but this was removed prior to bariatric surgery. Her last A1C was 8.7 in September 2022 per bariatric surgeon. She is checking glucose readings once daily after waking in the afternoon, fasting, which are ranging 130's-140's.   5) Essential Hypertension: Currently  prescribed lisinopril 30 mg daily, but she stopped taking lisinopril 10 days ago as she saw recurrent "low" BP readings of 109/67, 112/82, 130/81, 131/78, 133/88. She denies dizziness, headaches.   6) Sore Throat: She is also requesting a strep test for sore throat. Sore throat for the last week, increased symptoms for a few days, had trouble swallowing with pain. Symptoms improved late last week, feels slightly more swollen today.  She denies fevers, chills, body aches.  Wt Readings from Last 3 Encounters:  06/20/21 292 lb (132.5 kg)  06/05/21 299 lb 6.4 oz (135.8 kg)  05/10/21 (!) 314 lb (142.4 kg)   BP Readings from Last 3 Encounters:  06/20/21 126/78  04/05/21 (!) 111/99  02/15/21 122/70        Review of Systems  Constitutional:  Negative for fatigue.  Respiratory:  Negative for shortness of breath.   Cardiovascular:  Negative for chest pain.  Gastrointestinal:  Negative for abdominal pain, constipation, diarrhea and nausea.  Musculoskeletal:  Positive for arthralgias and back pain.  Neurological:  Negative for dizziness.  Psychiatric/Behavioral:  The patient is not nervous/anxious.         Past Medical History:  Diagnosis Date   Anxiety    Arthritis    Back pain    Brain fog    CHF (congestive heart failure) (HCC)    pt not aware of this   Depression    Diabetes mellitus without complication (HCC)    Difficulty walking    Dysmetabolic syndrome X    Fatigue  Fibromyalgia    neuropathy   Hypertension    Hypertriglyceridemia    Joint pain    Leg weakness    Lower extremity edema    Muscle atrophy    Neuropathy    Obesity    Osteoarthritis    PONV (postoperative nausea and vomiting)    Poor memory    Post-menopausal bleeding    Prediabetes    Rosacea    Sleep apnea    not on cpap yet   Tetanus vaccine causing adverse effect in therapeutic use    Vitamin D deficiency     Social History   Socioeconomic History   Marital status: Married    Spouse  name: Ed   Number of children: 3   Years of education: Not on file   Highest education level: Not on file  Occupational History   Occupation: Investment banker, corporate Retirement    Employer: [employer]  Tobacco Use   Smoking status: Never   Smokeless tobacco: Never  Vaping Use   Vaping Use: Never used  Substance and Sexual Activity   Alcohol use: Not Currently    Comment: rarely - once a year   Drug use: No   Sexual activity: Not Currently    Birth control/protection: Post-menopausal  Other Topics Concern   Not on file  Social History Narrative   Best boy      Married; 3 kids (middle child-severely autistic)      No regular exercise   Social Determinants of Health   Financial Resource Strain: Not on file  Food Insecurity: Not on file  Transportation Needs: Not on file  Physical Activity: Not on file  Stress: Not on file  Social Connections: Not on file  Intimate Partner Violence: Not on file    Past Surgical History:  Procedure Laterality Date   BONE TUMOR EXCISION  1975   Right leg   DILATATION & CURETTAGE/HYSTEROSCOPY WITH MYOSURE N/A 10/07/2019   Procedure: DILATATION AND CURETTAGE /HYSTEROSCOPY, Myosure, Polypectomy;  Surgeon: Lilly Bing, MD;  Location: [facility] OR;  Service: Gynecology;  Laterality: N/A;   ENDOMETRIAL BIOPSY  07/14/2019       epidural steroid injection     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   Procedure: LAPAROSCOPIC ROUX-EN-Y GASTRIC BYPASS WITH UPPER ENDOSCOPY;  Surgeon: Sheliah Hatch, De Blanch, MD;  Location: [facility];  Service: General;  Laterality: N/A;    Family History  Problem Relation Age of Onset   Rheum arthritis Mother    COPD Mother    Stroke Mother    Depression Mother    Anxiety disorder Mother    Eating disorder Mother    Obesity Mother    Hypertension Father    Depression Father    Alcoholism Father    Heart disease Maternal Grandmother    Stroke Maternal Grandmother    Alcohol abuse Maternal Grandmother     Alcohol abuse Maternal Grandfather    Aneurysm Paternal Grandmother    Autism Son        severe   Diabetes Paternal Aunt    Breast cancer Neg Hx     Allergies  Allergen Reactions   Diphenhydramine Hcl Rash    minor rash   Penicillins Rash    Current Outpatient Medications on File Prior to Visit  Medication Sig Dispense Refill   acetaminophen (TYLENOL) 650 MG CR tablet Take 1,300 mg by mouth every 8 (eight) hours as needed for pain.     atorvastatin (LIPITOR) 10 MG tablet TAKE 1  TABLET BY MOUTH DAILY. FOR CHOLESTEROL 90 tablet 1   cetirizine (ZYRTEC) 10 MG tablet TAKE 1 TABLET (10 MG TOTAL) BY MOUTH DAILY. FOR ALLERGIES 90 tablet 1   citalopram (CELEXA) 40 MG tablet Take 40 mg by mouth daily.     DULoxetine (CYMBALTA) 30 MG capsule Take 30 mg by mouth daily. Take with 60 mg to equal 90 mg daily     DULoxetine (CYMBALTA) 60 MG capsule Take 60 mg by mouth daily. Take with 30 mg to equal 90 mg daily     folic acid (FOLVITE) 1 MG tablet Take 1 mg by mouth daily.     gabapentin (NEURONTIN) 300 MG capsule TAKE 1 CAPSULE BY MOUTH 3 TIMES A DAY AS NEEDED FOR BACK PAIN 270 capsule 3   lamoTRIgine (LAMICTAL) 100 MG tablet Take 100 mg by mouth every evening.     lamoTRIgine (LAMICTAL) 200 MG tablet Take 200 mg by mouth in the morning.     metFORMIN (GLUCOPHAGE-XR) 500 MG 24 hr tablet TAKE 2 TABLETS (1,000 MG TOTAL) BY MOUTH DAILY WITH BREAKFAST. FOR DIABETES. 180 tablet 3   metroNIDAZOLE (METROCREAM) 0.75 % cream Apply 1 application topically daily as needed (rosacea).     Multiple Minerals-Vitamins (CALCIUM-MAGNESIUM-ZINC-D3) TABS Take 2 tablets by mouth daily.     ondansetron (ZOFRAN-ODT) 4 MG disintegrating tablet Dissolve 1 tablet (4 mg total) by mouth every 6 (six) hours as needed for nausea or vomiting. 20 tablet 0   pantoprazole (PROTONIX) 40 MG tablet Take 1 tablet (40 mg total) by mouth daily. 90 tablet 0   traZODone (DESYREL) 50 MG tablet Take 50 mg by mouth at bedtime as needed for  sleep.     Vitamin D, Ergocalciferol, (DRISDOL) 1.25 MG (50000 UNIT) CAPS capsule TAKE 1 CAPSULE BY MOUTH ONCE WEEKLY FOR VITAMIN D. 12 capsule 1   glipiZIDE (GLUCOTROL) 10 MG tablet TAKE 1 TABLET (10 MG TOTAL) BY MOUTH 2 (TWO) TIMES DAILY BEFORE A MEAL. FOR DIABETES. (Patient not taking: Reported on 06/20/2021) 180 tablet 0   lisinopril (ZESTRIL) 30 MG tablet TAKE 1 TABLET (30 MG TOTAL) BY MOUTH DAILY. FOR BLOOD PRESSURE (Patient not taking: Reported on 06/20/2021) 90 tablet 3   No current facility-administered medications on file prior to visit.    BP 126/78    Pulse 100    Temp 98.3 F (36.8 C) (Temporal)    Ht 5\' 6"  (1.676 m)    Wt 292 lb (132.5 kg)    LMP  (LMP Unknown)    SpO2 97%    BMI 47.13 kg/m  Objective:   Physical Exam Cardiovascular:     Rate and Rhythm: Normal rate and regular rhythm.  Pulmonary:     Effort: Pulmonary effort is normal.     Breath sounds: Normal breath sounds.  Musculoskeletal:     Cervical back: Neck supple.  Skin:    General: Skin is warm and dry.  Psychiatric:        Mood and Affect: Mood normal.          Assessment & Plan:      This visit occurred during the SARS-CoV-2 public health emergency.  Safety protocols were in place, including screening questions prior to the visit, additional usage of staff PPE, and extensive cleaning of exam room while observing appropriate contact time as indicated for disinfecting solutions.

## 2021-06-20 NOTE — Assessment & Plan Note (Signed)
Continue atorvastatin 10 mg daily. Repeat lipid panel pending. 

## 2021-06-20 NOTE — Assessment & Plan Note (Signed)
Reviewed office notes from care everywhere per bariatric surgery from February 2023.  Reviewed labs from care everywhere per bariatric surgeon from September 2022.  Repeat labs pending today. Patient will pass these along to her surgeon.  Weight loss of 74 pounds from June 22 until now compared to our scales.  Commended her on this success!

## 2021-06-20 NOTE — Assessment & Plan Note (Signed)
Weight loss of 74 pounds since June 2022, commended her on this!  Repeat A1c pending today.  Continue metformin XR 1000 mg daily. No longer on glipizide bariatric surgeon.  Managed on ACE and statin.  Follow-up in 3 to 6 months depending on A1c result. Reviewed A1c from September 2022 through care everywhere.

## 2021-06-20 NOTE — Assessment & Plan Note (Signed)
Controlled. °Following with psychiatry. ° °Continue Lamictal 200 mg in a.m., 100 mg at bedtime. °Continue citalopram 40 mg daily, Cymbalta 90 mg daily, trazodone 50 mg as needed. °

## 2021-06-20 NOTE — Patient Instructions (Signed)
Stop by the lab prior to leaving today. I will notify you of your results once received.   I will be in touch regarding our treatment plan for diabetes.  Monitor your blood pressure, please notify me if you continue to see readings at or above 130 on top and/or 90 on bottom.  Continue to hold your lisinopril 30 mg for now.  It was a pleasure to see you today!

## 2021-06-20 NOTE — Assessment & Plan Note (Signed)
Improving with weight loss since bariatric surgery.  Encouraged daily movement and walking. Continue to monitor.  Continue gabapentin 300 mg 2-3 times daily.

## 2021-06-20 NOTE — Assessment & Plan Note (Signed)
Negative for strep.  Suspect postnasal drip from allergies. She does not appear sickly today.

## 2021-06-22 ENCOUNTER — Ambulatory Visit (INDEPENDENT_AMBULATORY_CARE_PROVIDER_SITE_OTHER): Payer: BC Managed Care – PPO | Admitting: Psychology

## 2021-06-22 DIAGNOSIS — F339 Major depressive disorder, recurrent, unspecified: Secondary | ICD-10-CM | POA: Diagnosis not present

## 2021-06-22 NOTE — Progress Notes (Signed)
[facility] Counselor/Therapist Progress Note  Patient ID: Brandy [name], MRN: [id number]    Date: 06/22/21  Time Spent: 12:35 pm - 1:32 pm: 57 Minutes  Treatment Type: Individual Therapy.  Reported Symptoms: Anxiety   Mental Status Exam: Appearance:  Fairly Groomed     Behavior: Appropriate  Motor: Normal  Speech/Language:  Clear and Coherent and Normal Rate  Affect: Appropriate  Mood: normal  Thought process: normal  Thought content:   WNL  Sensory/Perceptual disturbances:   WNL  Orientation: oriented to person, place, time/date, and situation  Attention: Good  Concentration: Good  Memory: WNL  Fund of knowledge:  Good  Insight:   Good  Judgment:  Good  Impulse Control: Good   Risk Assessment: Danger to Self:  No Self-injurious Behavior: No Danger to Others: No Duty to Warn:no Physical Aggression / Violence:No  Access to Firearms a concern: No  Gang Involvement:No   Subjective:   Brandy [name] participated car, via video, and consented to treatment. Therapist participated from office. We met online due to [place] pandemic. Brandy [name] reviewed the events of the past week. Brandy [name]  noted continued attempts to manage her "food thing" in relation to her post-surgical bariatric, which we will continue to explore going forward. Brandy [name] noted a strong avoidence of media that might elicit a strong emotional response. Brandy [name] noted a recent fall and noted feeling unsteady after some duration of walking. She noted having physical therapy (PT) exercises that could aid in this but difficulty engaging in these exercises. Therapist employed BA principles to identify barriers (difficulty waking up in the AM, not prioritizing her PT exercises), creating a schedule (1x per day for ~5 minutes), preparation (location, privacy), and ways to address barriers. We discussed values and how this goal aligns with her goals and values, as well. Brandy [name] was engaged and motivated during the  session. She expressed commitment towards our goals. Therapist praised Brandy [name] other effort and energy and provided supportive therapy.   Interventions: Behavioral Activation.    Interventions: Interpersonal.  Diagnosis: Episode of recurrent major depressive disorder, unspecified depression episode severity ([place])   Treatment Plan:  [name], forthcoming, self-aware, and motivated for change.   Support System: Friend Brandy [name]) and Family.   Client Treatment Preferences Outpatient Therapy.  Client Statement of Needs Brandy [name]'s goals for treatment managing her mood and symptoms, processing past events, focus on self-care, set boundaries for self and others, decrease negative self-talk, decrease rumination, process relationship stressors, and improve health.   Treatment Level Weekly  Symptoms  Depressive symptoms: Decreased interest, feeling down, poor sleep, lethargy, psycho-motor retardation, feeling bad about self.  (Status: maintained)  Anxiety: Difficulty managing worry, worrying about different things, restlessness, irritability, afraid something awful might happen  (Status: maintained)  Goals:  Brandy [name] experiences symptoms of depression and anxiety.   Target Date: 2022-06-08 Frequency: Weekly  Progress: 0 Modality: individual    Therapist will provide referrals for additional resources as appropriate.  Therapist will provide psycho-education regarding Brandy [name]'s diagnosis and corresponding treatment approaches and interventions. Licensed Clinical Social Worker, [place], LCSW will support the patient's ability to achieve the goals identified. will employ CBT, BA, Problem-solving, Solution Focused, Mindfulness,  coping skills, & other evidenced-based practices will be used to promote progress towards healthy functioning to help manage decrease symptoms associated with her diagnosis.   Reduce overall level, frequency, and intensity of the feelings  of depression and anxiety as evidenced by decreased overall symptoms of depression and anxiety  from 6 to 7 days/week to 0 to 1 days/week per client report for at least 3 consecutive months. Verbally express understanding of the relationship between feelings of depression, anxiety and their impact on thinking patterns and behaviors. Verbalize an understanding of the role that distorted thinking plays in creating fears, excessive worry, and ruminations.  Brandy [name] participated in the creation of the treatment plan)  Brandy Irish, LCSW

## 2021-06-25 LAB — VITAMIN B1: Vitamin B1 (Thiamine): 39 nmol/L — ABNORMAL HIGH (ref 8–30)

## 2021-06-29 ENCOUNTER — Ambulatory Visit (INDEPENDENT_AMBULATORY_CARE_PROVIDER_SITE_OTHER): Payer: BC Managed Care – PPO | Admitting: Psychology

## 2021-06-29 DIAGNOSIS — F339 Major depressive disorder, recurrent, unspecified: Secondary | ICD-10-CM

## 2021-06-29 NOTE — Progress Notes (Signed)
[facility] Counselor/Therapist Progress Note ? ?Patient ID: Brandy [name], MRN: [id number]   ? ?Date: 06/29/21 ? ?Time Spent: 12:34 pm - 1:22 pm: 48 Minutes ? ?Treatment Type: Individual Therapy. ? ?Reported Symptoms: Anxiety  ? ?Mental Status Exam: ?Appearance:  Fairly Groomed     ?Behavior: Appropriate  ?Motor: Normal  ?Speech/Language:  Clear and Coherent and Normal Rate  ?Affect: Appropriate  ?Mood: normal  ?Thought process: normal  ?Thought content:   WNL  ?Sensory/Perceptual disturbances:   WNL  ?Orientation: oriented to person, place, time/date, and situation  ?Attention: Good  ?Concentration: Good  ?Memory: WNL  ?Fund of knowledge:  Good  ?Insight:   Good  ?Judgment:  Good  ?Impulse Control: Good  ? ?Risk Assessment: ?Danger to Self:  No ?Self-injurious Behavior: No ?Danger to Others: No ?Duty to Warn:no ?Physical Aggression / Violence:No  ?Access to Firearms a concern: No  ?Gang Involvement:No  ? ?Subjective:  ? ?Brandy [name] participated home, via phone, and consented to treatment. Therapist participated from office. We met online due to Brandy [name] pandemic. Brandy [name] reviewed the events of the past week. She dicussed her struggles with building and maintaining a sleep routine and the difficulty she experiences the following day. We explored this during the session and the effect of her sleeping in on her overall schedule and mood. We reviewed her past attempts to regulate her sleep schedule and have a set wake time. Therapist employed BA principle to identify possible barriers, review the attainability of her goals, set more reasonable and achievable goals, and work towards progressive change.  Therapist related her goal to moorings values and ultimate goal of health, fitness, and building a routine.  Were discussed interpersonal stressors with her granddaughter and her attempts to resolve this with her granddaughter and daughter.  We explored this during the session.  Therapist  validated and normalized Brandy [name]'s feelings and experience.  We scheduled a follow-up for continued treatment.  Therapist provided supportive therapy. ? ? ?Interventions: Behavioral Activation.   ? ?Interventions: Interpersonal and behavioral activation . ? ?Diagnosis: Episode of recurrent major depressive disorder, unspecified depression episode severity ([place]) ? ? ?Treatment Plan: ? ?Client Abilities/Strengths ?Brandy [name] is intelligent, forthcoming, self-aware, and motivated for change.  ? ?Support System: ?Friend Brandy [name]) and Family.  ? ?Client Treatment Preferences ?Outpatient Therapy. ? ?Client Statement of Needs ?Vung's goals for treatment managing her mood and symptoms, processing past events, focus on self-care, set boundaries for self and others, decrease negative self-talk, decrease rumination, process relationship stressors, and improve health.  ? ?Treatment Level ?Weekly ? ?Symptoms ? ?Depressive symptoms: Decreased interest, feeling down, poor sleep, lethargy, psycho-motor retardation, feeling bad about self.  (Status: maintained) ? ?Anxiety: Difficulty managing worry, worrying about different things, restlessness, irritability, afraid something awful might happen  (Status: maintained) ? ?Goals:  ?Brittannie experiences symptoms of depression and anxiety. ? ? ?Target Date: 2022-06-08 Frequency: Weekly  ?Progress: 0 Modality: individual  ? ? ?Therapist will provide referrals for additional resources as appropriate.  ?Therapist will provide psycho-education regarding Dany's diagnosis and corresponding treatment approaches and interventions. ?Licensed Clinical Social Worker, [name], LCSW will support the patient's ability to achieve the goals identified. will employ CBT, BA, Problem-solving, Solution Focused, Mindfulness,  coping skills, & other evidenced-based practices will be used to promote progress towards healthy functioning to help manage decrease symptoms associated with her diagnosis.  ? Reduce overall  level, frequency, and intensity of the feelings of depression and anxiety as evidenced by decreased overall symptoms  of depression and anxiety from 6 to 7 days/week to 0 to 1 days/week per client report for at least 3 consecutive months. ?Verbally express understanding of the relationship between feelings of depression, anxiety and their impact on thinking patterns and behaviors. ?Verbalize an understanding of the role that distorted thinking plays in creating fears, excessive worry, and ruminations. ? ?(Renarda participated in the creation of the treatment plan) ? ? ?[name], LCSW ?

## 2021-07-06 ENCOUNTER — Ambulatory Visit (INDEPENDENT_AMBULATORY_CARE_PROVIDER_SITE_OTHER): Payer: BC Managed Care – PPO | Admitting: Psychology

## 2021-07-06 DIAGNOSIS — F339 Major depressive disorder, recurrent, unspecified: Secondary | ICD-10-CM | POA: Diagnosis not present

## 2021-07-06 NOTE — Progress Notes (Signed)
[facility] Counselor/Therapist Progress Note ? ?Patient ID: Brandy [name], MRN: [id number]   ? ?Date: 07/06/21 ? ?Time Spent: 12:35 pm - 1:34 pm: 61 Minutes ? ?Treatment Type: Individual Therapy. ? ?Reported Symptoms: Anxiety  ? ?Mental Status Exam: ?Appearance:  Fairly Groomed     ?Behavior: Appropriate  ?Motor: Normal  ?Speech/Language:  Clear and Coherent and Normal Rate  ?Affect: Appropriate  ?Mood: anxious  ?Thought process: normal  ?Thought content:   WNL  ?Sensory/Perceptual disturbances:   WNL  ?Orientation: oriented to person, place, time/date, and situation  ?Attention: Good  ?Concentration: Good  ?Memory: WNL  ?Fund of knowledge:  Good  ?Insight:   Good  ?Judgment:  Good  ?Impulse Control: Good  ? ?Risk Assessment: ?Danger to Self:  No ?Self-injurious Behavior: No ?Danger to Others: No ?Duty to Warn:no ?Physical Aggression / Violence:No  ?Access to Firearms a concern: No  ?Gang Involvement:No  ? ?Subjective:  ? ?Brandy [name] participated car, via video, and consented to treatment. Therapist participated from home office. We met online due to [place] pandemic. Brandy [name] reviewed the events of the past week. Brandy [name] noted difficulties with her eating, post bariatric surgery, including eating rapidly, experiencing food related aversion, and difficulty adjusting to her overall diet. We explored this, during the session, and the effect on her mood and functioning. Therapist encouraged Brandy [name] to contact her doctor and registered dietician to discuss concerns and work on addressing her concerns including experiencing pain and discomfort while eating.  Therapist provided psychoeducation regarding the effects of food on mood including the effects of high vs. low palatability foods on mood.  We worked on processing how she views food and how she would like to view food going forward.  We will continue to work in this area going forward.  Brandy [name] discussed interpersonal stressors with her husband  specifically in regards to not being heard and having difficulty giving feedback.  We reviewed her recent experience with her and her husband and discussed ways to address this going forward using positive and assertive communication which was modeled during the session.  Brandy [name] discussed her barriers to this including how this type of communication might affect her husband.  We began the process this during the session. therapist discussed the importance of to be communication and relationships which includes assertiveness, positive interactions, and boundary setting.  Brandy [name] was engaged and motivated and expressed her commitment towards her goals.  She discussed the topic, Brandy [name], which she would like to discuss during our next session.  Therapist validated [name]'s experience and identified areas of possible improvement in regards to communication styles.  Therapist provided supportive therapy ? ?Interventions: interpersonal stressors and assertive communication. ? ?Interventions: Interpersonal and behavioral activation . ? ?Diagnosis: Episode of recurrent major depressive disorder, unspecified depression episode severity ([place]) ? ? ?Treatment Plan: ? ?Client Abilities/Strengths ?Brandy [name] is intelligent, forthcoming, self-aware, and motivated for change.  ? ?Support System: ?Friend [name] and Family.  ? ?Client Treatment Preferences ?Outpatient Therapy. ? ?Client Statement of Needs ?Brandy [name]'s goals for treatment managing her mood and symptoms, processing past events, focus on self-care, set boundaries for self and others, decrease negative self-talk, decrease rumination, process relationship stressors, and improve health.  ? ?Treatment Level ?Weekly ? ?Symptoms ? ?Depressive symptoms: Decreased interest, feeling down, poor sleep, lethargy, psycho-motor retardation, feeling bad about self.  (Status: maintained) ? ?Anxiety: Difficulty managing worry, worrying about different things, restlessness, irritability, afraid  something awful might happen  (Status: maintained) ? ?Goals:  ?Brandy [name]  experiences symptoms of depression and anxiety. ? ? ?Target Date: 2022-06-08 Frequency: Weekly  ?Progress: 0 Modality: individual  ? ? ?Therapist will provide referrals for additional resources as appropriate.  ?Therapist will provide psycho-education regarding Brandy [name]'s diagnosis and corresponding treatment approaches and interventions. ?Licensed Clinical Social Worker, [name], LCSW will support the patient's ability to achieve the goals identified. will employ CBT, BA, Problem-solving, Solution Focused, Mindfulness,  coping skills, & other evidenced-based practices will be used to promote progress towards healthy functioning to help manage decrease symptoms associated with her diagnosis.  ? Reduce overall level, frequency, and intensity of the feelings of depression and anxiety as evidenced by decreased overall symptoms of depression and anxiety from 6 to 7 days/week to 0 to 1 days/week per client report for at least 3 consecutive months. ?Verbally express understanding of the relationship between feelings of depression, anxiety and their impact on thinking patterns and behaviors. ?Verbalize an understanding of the role that distorted thinking plays in creating fears, excessive worry, and ruminations. ? ?(Brandy [name] participated in the creation of the treatment plan) ? ? ? ?[name], LCSW ?

## 2021-07-13 ENCOUNTER — Ambulatory Visit (INDEPENDENT_AMBULATORY_CARE_PROVIDER_SITE_OTHER): Payer: BC Managed Care – PPO | Admitting: Psychology

## 2021-07-13 DIAGNOSIS — F339 Major depressive disorder, recurrent, unspecified: Secondary | ICD-10-CM | POA: Diagnosis not present

## 2021-07-13 NOTE — Progress Notes (Signed)
[facility] Counselor/Therapist Progress Note ? ?Patient ID: Brandy [name], MRN: [id number]   ? ?Date: 07/13/21 ? ?Time Spent: 12:37 pm - 1:30 pm: 53 Minutes ? ?Treatment Type: Individual Therapy. ? ?Reported Symptoms: Anxiety  ? ?Mental Status Exam: ?Appearance:  Fairly Groomed     ?Behavior: Appropriate  ?Motor: Normal  ?Speech/Language:  Clear and Coherent and Normal Rate  ?Affect: Appropriate  ?Mood: anxious  ?Thought process: normal  ?Thought content:   WNL  ?Sensory/Perceptual disturbances:   WNL  ?Orientation: oriented to person, place, time/date, and situation  ?Attention: Good  ?Concentration: Good  ?Memory: WNL  ?Fund of knowledge:  Good  ?Insight:   Good  ?Judgment:  Good  ?Impulse Control: Good  ? ?Risk Assessment: ?Danger to Self:  No ?Self-injurious Behavior: No ?Danger to Others: No ?Duty to Warn:no ?Physical Aggression / Violence:No  ?Access to Firearms a concern: No  ?Gang Involvement:No  ? ?Subjective:  ? ?[name] participated car, via video, and consented to treatment. Therapist participated from home office. We met online due to [place] pandemic. [name] reviewed the events of the past week. [name] discussed her avoidance regarding giving negative or neutral feedback to her husband. We explored her avoidance, during the session, and the effects of this on her long-term goal of building emotional intimacy and her own mood, as a result.  We worked on exploring this avoidance which has been a common theme in the sessions.  [name] discussed her worry that she might hurt her husband's feelings therapist challenged this by discussing [name] strive to become more emotionally connected and discussed her avoidance as a barrier to this.  We worked on identifying ways to communicate needs and wants assertively, calmly, and positively.  Therapist modeled this during the session and discussed the importance of open communication and honesty in regards to developing closeness and  intimacy.  Therapist encouraged [name] to continue to be introspective this and this area and build comfort giving feedback.  [name] discussed some difficulty with sleep often napping for hours at a time during the day.  We worked on identifying possible cause of this had to shift her sleep schedule to support decreased frequency and duration of napping and to affect her nighttime sleep routine in a positive manner.  We engaged in problem solving in this area and reviewed sleep hygiene.  [name] was engaged and motivated during the session.  She expressed commitment towards her goals.  Therapist praised [name] for her effort and encouraged her to work on challenging negative cognitions and feelings and processing through them in a positive manner.  Therapist provided supportive therapy. ? ?Interventions: interpersonal stressors and assertive communication. ? ?Interventions: Interpersonal and behavioral activation . ? ?Diagnosis: Episode of recurrent major depressive disorder, unspecified depression episode severity ([place]) ? ? ?Treatment Plan: ? ?Client Abilities/Strengths ?Elis is intelligent, forthcoming, self-aware, and motivated for change.  ? ?Support System: ?Friend [name] and Family.  ? ?Client Treatment Preferences ?Outpatient Therapy. ? ?Client Statement of Needs ?[name]'s goals for treatment managing her mood and symptoms, processing past events, focus on self-care, set boundaries for self and others, decrease negative self-talk, decrease rumination, process relationship stressors, and improve health.  ? ?Treatment Level ?Weekly ? ?Symptoms ? ?Depressive symptoms: Decreased interest, feeling down, poor sleep, lethargy, psycho-motor retardation, feeling bad about self.  (Status: maintained) ? ?Anxiety: Difficulty managing worry, worrying about different things, restlessness, irritability, afraid something awful might happen  (Status: maintained) ? ?Goals:  ?[name] experiences symptoms of depression  and  anxiety. ? ? ?Target Date: 2022-06-08 Frequency: Weekly  ?Progress: 0 Modality: individual  ? ? ?Therapist will provide referrals for additional resources as appropriate.  ?Therapist will provide psycho-education regarding [name]'s diagnosis and corresponding treatment approaches and interventions. ?Licensed Clinical Social Worker, [name], LCSW will support the patient's ability to achieve the goals identified. will employ CBT, BA, Problem-solving, Solution Focused, Mindfulness,  coping skills, & other evidenced-based practices will be used to promote progress towards healthy functioning to help manage decrease symptoms associated with her diagnosis.  ? Reduce overall level, frequency, and intensity of the feelings of depression and anxiety as evidenced by decreased overall symptoms of depression and anxiety from 6 to 7 days/week to 0 to 1 days/week per client report for at least 3 consecutive months. ?Verbally express understanding of the relationship between feelings of depression, anxiety and their impact on thinking patterns and behaviors. ?Verbalize an understanding of the role that distorted thinking plays in creating fears, excessive worry, and ruminations. ? ?(Catlyn participated in the creation of the treatment plan) ? ? ? ?[name], LCSW ?

## 2021-07-16 NOTE — Progress Notes (Signed)
Cardiology Office Note ? ?Date:  07/17/2021  ? ?ID:  Brandy [name], DOB Mar 08, 1966, MRN [id number] ? ?PCP:  Doreene Nest, NP  ? ?Chief Complaint  ?Patient presents with  ? 3 month follow up   ?  Patient c/o shortness of breath with little exertion and left ankle edema. Medications reviewed by the patient verbally.   ? ? ?HPI:  ?Brandy [name] is a 56 year-old woman with  ?morbid obesity,  ?hypertension,  ?history of anxiety ?Sleep study negative for sleep apnea.  ?Diabetes type 2, hemoglobin A1c greater than 8 ?Presenting tachycardia and HTN. ? ?Last seen by myself in clinic November 2020 ?Was seen by one of our providers October 2022 ?In September 22 reported having 3 episodes near syncope ?Was seen for preoperative evaluation for bariatric surgery ?Was felt to have vasovagal symptoms ?Denied loss of consciousness ? ?On further discussion today reports that she is very Deconditioned, ?If she overdoes it she will get weak in the legs, has to slow down, sometimes sit down ?No regular exercise program ? ?Weight continues to be an issue ? ?Lab work reviewed ?A1C 7.0 ? ?Reports having some stress/anxiety at home, son with disability ? ?EKG personally reviewed by myself on todays visit ?Shows normal sinus rhythm with rate 98 bpm poor R wave progression to the anterior precordial leads ? ? ?PMH:   has a past medical history of Anxiety, Arthritis, Back pain, Brain fog, CHF (congestive heart failure) (HCC), Depression, Diabetes mellitus without complication (HCC), Difficulty walking, Dysmetabolic syndrome X, Fatigue, Fibromyalgia, Hypertension, Hypertriglyceridemia, Joint pain, Leg weakness, Lower extremity edema, Muscle atrophy, Neuropathy, Obesity, Osteoarthritis, PONV (postoperative nausea and vomiting), Poor memory, Post-menopausal bleeding, Prediabetes, Rosacea, Sleep apnea, Tetanus vaccine causing adverse effect in therapeutic use, and Vitamin D deficiency. ? ?PSH:    ?Past Surgical History:  ?Procedure Laterality  Date  ? BONE TUMOR EXCISION  1975  ? Right leg  ? DILATATION & CURETTAGE/HYSTEROSCOPY WITH MYOSURE N/A 10/07/2019  ? Procedure: DILATATION AND CURETTAGE /HYSTEROSCOPY, Myosure, Polypectomy;  Surgeon: Palo Cedro Bing, MD;  Location: [facility] OR;  Service: Gynecology;  Laterality: N/A;  ? ENDOMETRIAL BIOPSY  07/14/2019  ?    ? epidural steroid injection    ? 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 Procedure: LAPAROSCOPIC ROUX-EN-Y GASTRIC BYPASS WITH UPPER ENDOSCOPY;  Surgeon: Kinsinger, De Blanch, MD;  Location: [facility];  Service: General;  Laterality: N/A;  ? ? ?Current Outpatient Medications  ?Medication Sig Dispense Refill  ? acetaminophen (TYLENOL) 650 MG CR tablet Take 1,300 mg by mouth every 8 (eight) hours as needed for pain.    ? atorvastatin (LIPITOR) 10 MG tablet TAKE 1 TABLET BY MOUTH DAILY. FOR CHOLESTEROL 90 tablet 1  ? cetirizine (ZYRTEC) 10 MG tablet TAKE 1 TABLET (10 MG TOTAL) BY MOUTH DAILY. FOR ALLERGIES 90 tablet 1  ? citalopram (CELEXA) 40 MG tablet Take 40 mg by mouth daily.    ? DULoxetine (CYMBALTA) 30 MG capsule Take 30 mg by mouth daily. Take with 60 mg to equal 90 mg daily    ? DULoxetine (CYMBALTA) 60 MG capsule Take 60 mg by mouth daily. Take with 30 mg to equal 90 mg daily    ? folic acid (FOLVITE) 1 MG tablet Take 1 mg by mouth daily.    ? gabapentin (NEURONTIN) 300 MG capsule TAKE 1 CAPSULE BY MOUTH 3 TIMES A DAY AS NEEDED FOR BACK PAIN 270 capsule 3  ? lamoTRIgine (LAMICTAL) 100 MG tablet Take 100 mg by mouth every evening.    ?  lamoTRIgine (LAMICTAL) 200 MG tablet Take 200 mg by mouth in the morning.    ? lisinopril (ZESTRIL) 30 MG tablet TAKE 1 TABLET (30 MG TOTAL) BY MOUTH DAILY. FOR BLOOD PRESSURE 90 tablet 3  ? metFORMIN (GLUCOPHAGE-XR) 500 MG 24 hr tablet TAKE 2 TABLETS (1,000 MG TOTAL) BY MOUTH DAILY WITH BREAKFAST. FOR DIABETES. 180 tablet 3  ? metroNIDAZOLE (METROCREAM) 0.75 % cream Apply 1 application topically daily as needed (rosacea).    ? Multiple Minerals-Vitamins  (CALCIUM-MAGNESIUM-ZINC-D3) TABS Take 2 tablets by mouth daily.    ? ondansetron (ZOFRAN-ODT) 4 MG disintegrating tablet Dissolve 1 tablet (4 mg total) by mouth every 6 (six) hours as needed for nausea or vomiting. 20 tablet 0  ? pantoprazole (PROTONIX) 40 MG tablet Take 1 tablet (40 mg total) by mouth daily. 90 tablet 0  ? traZODone (DESYREL) 50 MG tablet Take 50 mg by mouth at bedtime as needed for sleep.    ? Vitamin D, Ergocalciferol, (DRISDOL) 1.25 MG (50000 UNIT) CAPS capsule TAKE 1 CAPSULE BY MOUTH ONCE WEEKLY FOR VITAMIN D. 12 capsule 1  ? ?No current facility-administered medications for this visit.  ? ? ?Allergies:   Diphenhydramine hcl and Penicillins  ? ?Social History:  The patient  reports that she has never smoked. She has never used smokeless tobacco. She reports that she does not currently use alcohol. She reports that she does not use drugs.  ? ?Family History:   family history includes Alcohol abuse in her maternal grandfather and maternal grandmother; Alcoholism in her father; Aneurysm in her paternal grandmother; Anxiety disorder in her mother; Autism in her son; COPD in her mother; Depression in her father and mother; Diabetes in her paternal aunt; Eating disorder in her mother; Heart disease in her maternal grandmother; Hypertension in her father; Obesity in her mother; Rheum arthritis in her mother; Stroke in her maternal grandmother and mother.  ? ? ?Review of Systems: ?Review of Systems  ?Constitutional:  Positive for malaise/fatigue.  ?HENT: Negative.    ?Respiratory:  Positive for shortness of breath.   ?Cardiovascular: Negative.   ?Gastrointestinal: Negative.   ?Musculoskeletal: Negative.   ?Neurological: Negative.   ?Psychiatric/Behavioral: Negative.    ?All other systems reviewed and are negative. ? ? ?PHYSICAL EXAM: ?VS:  BP (!) 128/92 (BP Location: Left Arm, Patient Position: Sitting, Cuff Size: Large)   Pulse 91   Ht 5\' 6"  (1.676 m)   Wt 298 lb 6 oz (135.3 kg)   LMP  (LMP  Unknown)   SpO2 98%   BMI 48.16 kg/m?  , BMI Body mass index is 48.16 kg/m? ?Constitutional:  oriented to person, place, and time. No distress.  Obese ?HENT:  ?Head: Grossly normal ?Eyes:  no discharge. No scleral icterus.  ?Neck: No JVD, no carotid bruits  ?Cardiovascular: Regular rate and rhythm, no murmurs appreciated ?Pulmonary/Chest: Clear to auscultation bilaterally, no wheezes or rails ?Abdominal: Soft.  no distension.  no tenderness.  ?Musculoskeletal: Normal range of motion ?Neurological:  normal muscle tone. Coordination normal. No atrophy ?Skin: Skin warm and dry ?Psychiatric: normal affect, pleasant ? ?Recent Labs: ?04/05/2021: ALT 18; BUN 12; Creatinine, Ser 0.79; Potassium 4.3; Sodium 133 ?06/20/2021: Hemoglobin 13.1; Platelets 395.0; TSH 1.81  ? ? ?Lipid Panel ?Lab Results  ?Component Value Date  ? CHOL 166 06/20/2021  ? HDL 40.10 06/20/2021  ? LDLCALC 92 06/20/2021  ? TRIG 169.0 (H) 06/20/2021  ? ? ?Wt Readings from Last 3 Encounters:  ?07/17/21 298 lb 6 oz (135.3 kg)  ?  06/20/21 292 lb (132.5 kg)  ?06/05/21 299 lb 6.4 oz (135.8 kg)  ?  ? ? ?ASSESSMENT AND PLAN: ? ?Problem List Items Addressed This Visit   ? ?  ? Cardiology Problems  ? HYPERTRIGLYCERIDEMIA  ? Essential hypertension, benign  ? Near syncope  ? PAD (peripheral artery disease) (HCC)  ?  ? Other  ? OBESITY, MORBID  ? ?Other Visit Diagnoses   ? ? Chronic diastolic heart failure (HCC)    -  Primary  ? Type 2 diabetes mellitus with other circulatory complication, unspecified whether long term insulin use (HCC)      ? SOB (shortness of breath)      ? Essential hypertension      ? ?  ? ?Morbid obesity ?With associated diabetes ?Contributing to fatigue/shortness of breath ?Discussed various strategies for conditioning, weight loss ?Recommend she retry pulmonary rehab ? ?Diastolic CHF ?Appears euvolemic, no further testing needed ? ?Morbid obesity ?Recommend referral to dietitian ?Pulmonary rehab/lung Works program ? ?Type 2 diabetes ?Hemoglobin  A1c improving ?Suggested dietitian and rehab as above ? ?Hypertension ?Blood pressure is well controlled on today's visit. No changes made to the medications. ? ? ?Hyperlipidemia ?Continue Lipitor ? ? Total encounter time more than

## 2021-07-17 ENCOUNTER — Ambulatory Visit (INDEPENDENT_AMBULATORY_CARE_PROVIDER_SITE_OTHER): Payer: BC Managed Care – PPO | Admitting: Cardiovascular Disease

## 2021-07-17 ENCOUNTER — Other Ambulatory Visit: Payer: Self-pay

## 2021-07-17 ENCOUNTER — Encounter: Payer: Self-pay | Admitting: Cardiovascular Disease

## 2021-07-17 VITALS — BP 128/92 | HR 91 | Ht 66.0 in | Wt 298.4 lb

## 2021-07-17 DIAGNOSIS — E781 Pure hyperglyceridemia: Secondary | ICD-10-CM

## 2021-07-17 DIAGNOSIS — R0602 Shortness of breath: Secondary | ICD-10-CM | POA: Diagnosis not present

## 2021-07-17 DIAGNOSIS — R0609 Other forms of dyspnea: Secondary | ICD-10-CM

## 2021-07-17 DIAGNOSIS — E1159 Type 2 diabetes mellitus with other circulatory complications: Secondary | ICD-10-CM | POA: Diagnosis not present

## 2021-07-17 DIAGNOSIS — I10 Essential (primary) hypertension: Secondary | ICD-10-CM

## 2021-07-17 DIAGNOSIS — R55 Syncope and collapse: Secondary | ICD-10-CM

## 2021-07-17 DIAGNOSIS — I5032 Chronic diastolic (congestive) heart failure: Secondary | ICD-10-CM | POA: Diagnosis not present

## 2021-07-17 DIAGNOSIS — I739 Peripheral vascular disease, unspecified: Secondary | ICD-10-CM

## 2021-07-17 NOTE — Patient Instructions (Addendum)
Pulmonary rehab for history of morbid obesity/diabetes/shortness of breath ? ? ?Medication Instructions:  ?No changes ? ?If you need a refill on your cardiac medications before your next appointment, please call your pharmacy.  ? ?Lab work: ?No new labs needed ? ?Testing/Procedures: ?No new testing needed ? ?Follow-Up: ?At [facility], you and your health needs are our priority.  As part of our continuing mission to provide you with exceptional heart care, we have created designated Provider Care Teams.  These Care Teams include your primary Cardiologist (physician) and Advanced Practice Providers (APPs -  Physician Assistants and Nurse Practitioners) who all work together to provide you with the care you need, when you need it. ? ?You will need a follow up appointment as needed ? ?Providers on your designated Care Team:   ?Murray Hodgkins, NP ?Christell Faith, PA-C ?Cadence Kathlen Mody, PA-C ? ?COVID-19 Vaccine Information can be found at: [email or web address] For questions related to vaccine distribution or appointments, please email [email or web address] or call [phone].  ? ?

## 2021-07-19 NOTE — Addendum Note (Signed)
Addended by: Maple Hudson on: 07/19/2021 03:06 PM ? ? Modules accepted: Orders ? ?

## 2021-07-20 ENCOUNTER — Ambulatory Visit (INDEPENDENT_AMBULATORY_CARE_PROVIDER_SITE_OTHER): Payer: BC Managed Care – PPO | Admitting: Psychology

## 2021-07-20 DIAGNOSIS — F339 Major depressive disorder, recurrent, unspecified: Secondary | ICD-10-CM

## 2021-07-20 NOTE — Progress Notes (Signed)
[place] Counselor/Therapist Progress Note ? ?Patient ID: Brandy [name], MRN: [id number]   ? ?Date: 07/20/21 ? ?Time Spent: 12:35 pm - 1:27 pm: 52 Minutes ? ?Treatment Type: Individual Therapy. ? ?Reported Symptoms: Anxiety  ? ?Mental Status Exam: ?Appearance:  Fairly Groomed     ?Behavior: Appropriate  ?Motor: Normal  ?Speech/Language:  Clear and Coherent and Normal Rate  ?Affect: Appropriate  ?Mood: depressed and dysthymic  ?Thought process: normal  ?Thought content:   WNL  ?Sensory/Perceptual disturbances:   WNL  ?Orientation: oriented to person, place, time/date, and situation  ?Attention: Good  ?Concentration: Good  ?Memory: WNL  ?Fund of knowledge:  Good  ?Insight:   Good  ?Judgment:  Good  ?Impulse Control: Good  ? ?Risk Assessment: ?Danger to Self:  No ?Self-injurious Behavior: No ?Danger to Others: No ?Duty to Warn:no ?Physical Aggression / Violence:No  ?Access to Firearms a concern: No  ?Gang Involvement:No  ? ?Subjective:  ? ?Brandy [name] participated car, via video, and consented to treatment. Therapist participated from home office. We met online due to [place] pandemic. Brandy [name] reviewed the events of the past week. Brandy [name] noted a recent follow-up with her cardiologist and getting a full-bill of health. She noted being referred for pulmonary rehab and noted a level of excitement towards this goal. She discussed feeling a sense of pride for requesting this referral which aligns with her long-term fitness goals. Therapist highlighted and acknowledged Brandy [name]'s efforts in this area. Brandy [name] continued her avoidance to provide feedback in her marriage yet laments the lack of closeness and intimacy in the relationship. We continued to explore this during the session and the effect of this on her overall mood, functioning, and goals. Therapist challenged Brandy [name] during the session and employed CBT principles. Brandy [name] was engaged and expressed her intent to engage in introspection regarding her  approach and avoidance. Therapist provided supportive therapy. ? ?Interventions: interpersonal stressors and assertive communication. ? ?Interventions: Cognitive Behavioral Therapy and Interpersonal. ? ?Diagnosis: Episode of recurrent major depressive disorder, unspecified depression episode severity ([place]) ? ? ?Treatment Plan: ? ?Client Abilities/Strengths ?Brandy [name] is intelligent, forthcoming, self-aware, and motivated for change.  ? ?Support System: ?Friend Brandy [name]) and Family.  ? ?Client Treatment Preferences ?Outpatient Therapy. ? ?Client Statement of Needs ?Brandy [name]'s goals for treatment managing her mood and symptoms, processing past events, focus on self-care, set boundaries for self and others, decrease negative self-talk, decrease rumination, process relationship stressors, and improve health.  ? ?Treatment Level ?Weekly ? ?Symptoms ? ?Depressive symptoms: Decreased interest, feeling down, poor sleep, lethargy, psycho-motor retardation, feeling bad about self.  (Status: maintained) ? ?Anxiety: Difficulty managing worry, worrying about different things, restlessness, irritability, afraid something awful might happen  (Status: maintained) ? ?Goals:  ?Brandy [name] symptoms of depression and anxiety. ? ? ?Target Date: 2022-06-08 Frequency: Weekly  ?Progress: 0 Modality: individual  ? ? ?Therapist will provide referrals for additional resources as appropriate.  ?Therapist will provide psycho-education regarding Brandy [name]'s diagnosis and corresponding treatment approaches and interventions. ?Licensed Clinical Social Worker, [name], LCSW will support the patient's ability to achieve the goals identified. will employ CBT, BA, Problem-solving, Solution Focused, Mindfulness,  coping skills, & other evidenced-based practices will be used to promote progress towards healthy functioning to help manage decrease symptoms associated with her diagnosis.  ? Reduce overall level, frequency, and intensity of the feelings of  depression and anxiety as evidenced by decreased overall symptoms of depression and anxiety from 6 to 7 days/week to 0 to 1 days/week  per client report for at least 3 consecutive months. ?Verbally express understanding of the relationship between feelings of depression, anxiety and their impact on thinking patterns and behaviors. ?Verbalize an understanding of the role that distorted thinking plays in creating fears, excessive worry, and ruminations. ? ?(Brandy [name] participated in the creation of the treatment plan) ? ? ? ?[name], LCSW ?

## 2021-07-24 ENCOUNTER — Encounter: Payer: Self-pay | Admitting: Cardiovascular Disease

## 2021-07-27 ENCOUNTER — Ambulatory Visit (INDEPENDENT_AMBULATORY_CARE_PROVIDER_SITE_OTHER): Payer: BC Managed Care – PPO | Admitting: Psychology

## 2021-07-27 DIAGNOSIS — F339 Major depressive disorder, recurrent, unspecified: Secondary | ICD-10-CM | POA: Diagnosis not present

## 2021-07-27 NOTE — Progress Notes (Signed)
[place] Counselor/Therapist Progress Note ? ?Patient ID: Brandy [name], MRN: [id number]   ? ?Date: 07/27/21 ? ?Time Spent: 12:34 pm - 1:28 pm:  54 Minutes ? ?Treatment Type: Individual Therapy. ? ?Reported Symptoms: Anxiety  ? ?Mental Status Exam: ?Appearance:  Fairly Groomed     ?Behavior: Appropriate  ?Motor: Normal  ?Speech/Language:  Clear and Coherent and Normal Rate  ?Affect: Appropriate  ?Mood: depressed and dysthymic  ?Thought process: normal  ?Thought content:   WNL  ?Sensory/Perceptual disturbances:   WNL  ?Orientation: oriented to person, place, time/date, and situation  ?Attention: Good  ?Concentration: Good  ?Memory: WNL  ?Fund of knowledge:  Good  ?Insight:   Good  ?Judgment:  Good  ?Impulse Control: Good  ? ?Risk Assessment: ?Danger to Self:  No ?Self-injurious Behavior: No ?Danger to Others: No ?Duty to Warn:no ?Physical Aggression / Violence:No  ?Access to Firearms a concern: No  ?Gang Involvement:No  ? ?Subjective:  ? ?Brandy [name] participated car, via video, and consented to treatment. Therapist participated from home office. We met online due to [place] pandemic. Brandy [name] reviewed the events of the past week. Brandy [name] noted rising frustration regarding her lack of recent weight-loss post bariatric surgery. She noted her frustration and disappointment regarding her referral to pulmonary being delayed and possibly denied. We explored her frustration and worked on feeling identification. She noted frustration regarding being unheard by her family as she makes simple requests and the effect of these unmet needs on her mood. Therapist validated Brandy [name]'s feeling and experiences during the session. We reviewed positive and assertive communication during the session, which therapist modeled.Therapist provided supportive therapy. ? ?Interventions: interpersonal stressors and assertive communication. ? ?Interventions: Cognitive Behavioral Therapy and Interpersonal. ? ?Diagnosis:  Episode of recurrent major depressive disorder, unspecified depression episode severity ([place]) ? ?Treatment Plan: ? ?Client Abilities/Strengths ?Brandy [name] is intelligent, forthcoming, self-aware, and motivated for change.  ? ?Support System: ?Friend Brandy [name]) and Family.  ? ?Client Treatment Preferences ?Outpatient Therapy. ? ?Client Statement of Needs ?Brandy [name]'s goals for treatment managing her mood and symptoms, processing past events, focus on self-care, set boundaries for self and others, decrease negative self-talk, decrease rumination, process relationship stressors, and improve health.  ? ?Treatment Level ?Weekly ? ?Symptoms ? ?Depressive symptoms: Decreased interest, feeling down, poor sleep, lethargy, psycho-motor retardation, feeling bad about self.  (Status: maintained) ? ?Anxiety: Difficulty managing worry, worrying about different things, restlessness, irritability, afraid something awful might happen  (Status: maintained) ? ?Goals:  ?Brandy [name] experiences symptoms of depression and anxiety. ? ? ?Target Date: 2022-06-08 Frequency: Weekly  ?Progress: 0 Modality: individual  ? ? ?Therapist will provide referrals for additional resources as appropriate.  ?Therapist will provide psycho-education regarding Brandy [name]'s diagnosis and corresponding treatment approaches and interventions. ?Licensed Clinical Social Worker, Brandy Irish, LCSW will support the patient's ability to achieve the goals identified. will employ CBT, BA, Problem-solving, Solution Focused, Mindfulness,  coping skills, & other evidenced-based practices will be used to promote progress towards healthy functioning to help manage decrease symptoms associated with her diagnosis.  ? Reduce overall level, frequency, and intensity of the feelings of depression and anxiety as evidenced by decreased overall symptoms of depression and anxiety from 6 to 7 days/week to 0 to 1 days/week per client report for at least 3 consecutive months. ?Verbally express understanding of  the relationship between feelings of depression, anxiety and their impact on thinking patterns and behaviors. ?Verbalize an understanding of the role that distorted thinking plays in creating fears, excessive  worry, and ruminations. ? ?(Tryphena participated in the creation of the treatment plan) ? ? ? ?Brandy Irish, LCSW ?

## 2021-07-29 ENCOUNTER — Other Ambulatory Visit: Payer: Self-pay | Admitting: Primary Care

## 2021-07-29 DIAGNOSIS — I10 Essential (primary) hypertension: Secondary | ICD-10-CM

## 2021-07-29 DIAGNOSIS — E1165 Type 2 diabetes mellitus with hyperglycemia: Secondary | ICD-10-CM

## 2021-07-29 DIAGNOSIS — E785 Hyperlipidemia, unspecified: Secondary | ICD-10-CM

## 2021-07-31 ENCOUNTER — Other Ambulatory Visit: Payer: Self-pay | Admitting: Primary Care

## 2021-07-31 DIAGNOSIS — E559 Vitamin D deficiency, unspecified: Secondary | ICD-10-CM

## 2021-08-03 ENCOUNTER — Ambulatory Visit (INDEPENDENT_AMBULATORY_CARE_PROVIDER_SITE_OTHER): Payer: BC Managed Care – PPO | Admitting: Psychology

## 2021-08-03 DIAGNOSIS — F339 Major depressive disorder, recurrent, unspecified: Secondary | ICD-10-CM | POA: Diagnosis not present

## 2021-08-03 NOTE — Progress Notes (Signed)
[place] Counselor/Therapist Progress Note ? ?Patient ID: Brandy [name], MRN: [id number]   ? ?Date: 08/03/21 ? ?Time Spent: 12:35 pm - 1:30 pm:  55 Minutes ? ?Treatment Type: Individual Therapy. ? ?Reported Symptoms: Anxiety  ? ?Mental Status Exam: ?Appearance:  Fairly Groomed     ?Behavior: Appropriate  ?Motor: Normal  ?Speech/Language:  Clear and Coherent and Normal Rate  ?Affect: Appropriate  ?Mood: depressed and dysthymic  ?Thought process: normal  ?Thought content:   WNL  ?Sensory/Perceptual disturbances:   WNL  ?Orientation: oriented to person, place, time/date, and situation  ?Attention: Good  ?Concentration: Good  ?Memory: WNL  ?Fund of knowledge:  Good  ?Insight:   Good  ?Judgment:  Good  ?Impulse Control: Good  ? ?Risk Assessment: ?Danger to Self:  No ?Self-injurious Behavior: No ?Danger to Others: No ?Duty to Warn:no ?Physical Aggression / Violence:No  ?Access to Firearms a concern: No  ?Gang Involvement:No  ? ?Subjective:  ? ?Brandy [name] participated car, via video, and consented to treatment. Therapist participated from home office. We met online due to [place] pandemic. Brandy [name] reviewed the events of the past week. Brandy [name] noted experiencing "struggles" with food. She dicussed this in regards to budgetary concerns and a disagreement with Ed, her husband, in regards to spending. Additionally, she discussed difficulty resisting food that she enjoys but noted her attempts to moderate her intake. We processed her disagreement with Ed regarding finances and ways to resolve this via assertive and clear communication, compromise, and pre-emptive discussion regarding spending. We explored her schemas regarding food. We will continue to explore this in future sessions. Therapist validated Brandy [name]'s feeling and experiences during the session. We reviewed positive and assertive communication during the session, which therapist modeled.Therapist provided supportive therapy. ? ?Interventions:  interpersonal stressors and assertive communication. ? ?Interventions: Cognitive Behavioral Therapy and Interpersonal. ? ?Diagnosis: Episode of recurrent major depressive disorder, unspecified depression episode severity ([place]) ? ?Treatment Plan: ? ?Client Abilities/Strengths ?Brandy [name] is intelligent, forthcoming, self-aware, and motivated for change.  ? ?Support System: ?Friend Brandy [name]) and Family.  ? ?Client Treatment Preferences ?Outpatient Therapy. ? ?Client Statement of Needs ?Brandy [name]'s goals for treatment managing her mood and symptoms, processing past events, focus on self-care, set boundaries for self and others, decrease negative self-talk, decrease rumination, process relationship stressors, and improve health.  ? ?Treatment Level ?Weekly ? ?Symptoms ? ?Depressive symptoms: Decreased interest, feeling down, poor sleep, lethargy, psycho-motor retardation, feeling bad about self.  (Status: maintained) ? ?Anxiety: Difficulty managing worry, worrying about different things, restlessness, irritability, afraid something awful might happen  (Status: maintained) ? ?Goals:  ?Brandy [name] experiences symptoms of depression and anxiety. ? ? ?Target Date: 2022-06-08 Frequency: Weekly  ?Progress: 10 Modality: individual  ? ? ?Therapist will provide referrals for additional resources as appropriate.  ?Therapist will provide psycho-education regarding Brandy [name]'s diagnosis and corresponding treatment approaches and interventions. ?Licensed Clinical Social Worker, Brandy Irish, LCSW will support the patient's ability to achieve the goals identified. will employ CBT, BA, Problem-solving, Solution Focused, Mindfulness,  coping skills, & other evidenced-based practices will be used to promote progress towards healthy functioning to help manage decrease symptoms associated with her diagnosis.  ? Reduce overall level, frequency, and intensity of the feelings of depression and anxiety as evidenced by decreased overall symptoms of depression and  anxiety from 6 to 7 days/week to 0 to 1 days/week per client report for at least 3 consecutive months. ?Verbally express understanding of the relationship between feelings of depression, anxiety and their impact  on thinking patterns and behaviors. ?Verbalize an understanding of the role that distorted thinking plays in creating fears, excessive worry, and ruminations. ? ?([name] participated in the creation of the treatment plan) ? ? ? ?Brandy Irish, LCSW ?

## 2021-08-08 IMAGING — DX DG TIBIA/FIBULA 2V*R*
4 series · 4 of 4 positions shown · non-contrast
Comparison: None.

CLINICAL DATA: History of benign bone tumor. Pain at resection
site.

EXAM:
RIGHT TIBIA AND FIBULA - 2 VIEW

[tibia ap (1 of 2)]
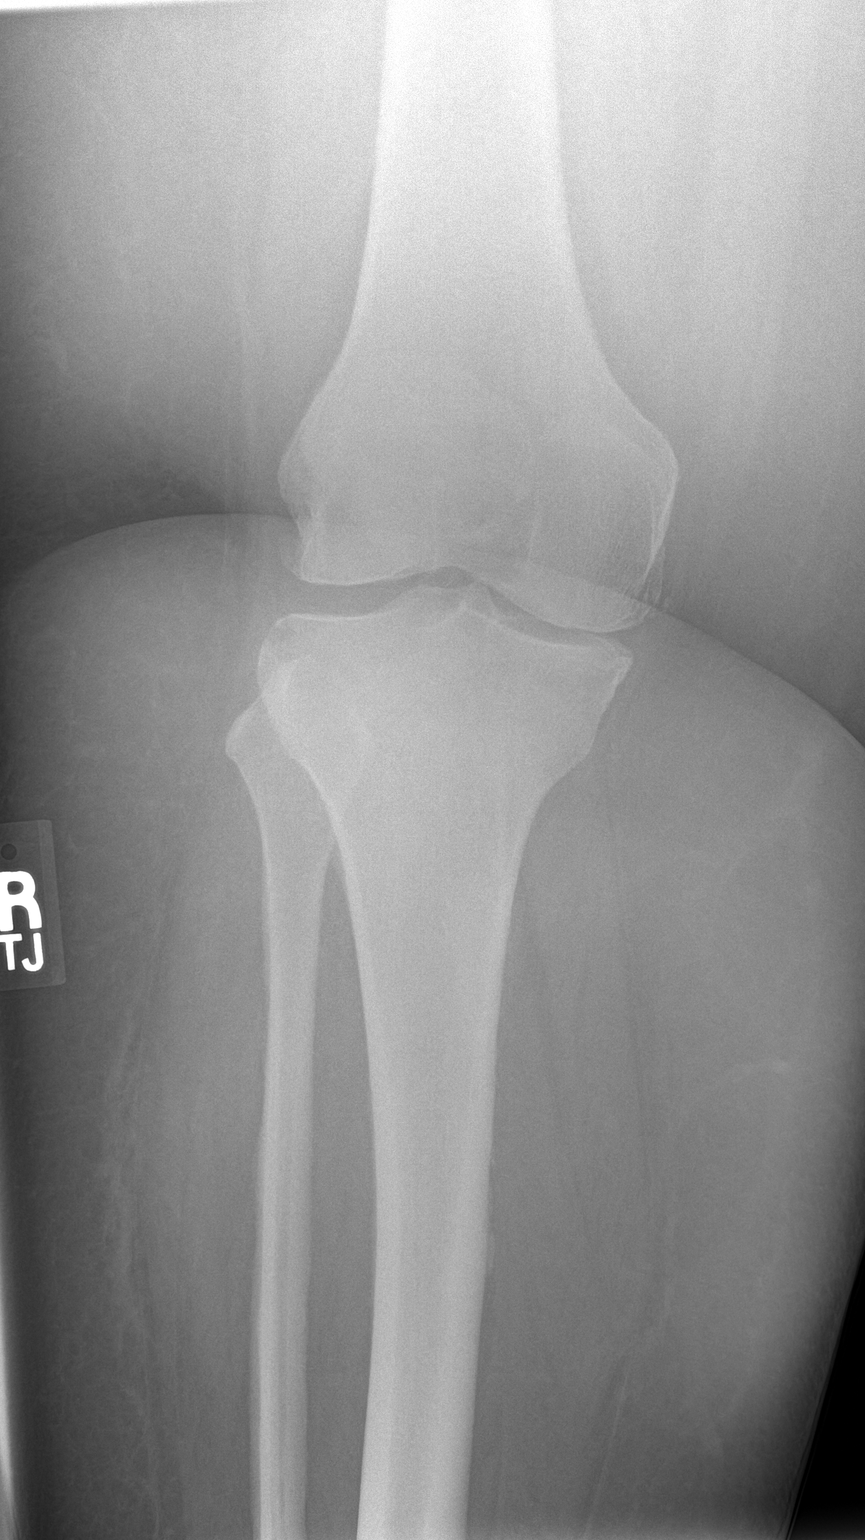

[tibia ap (2 of 2)]
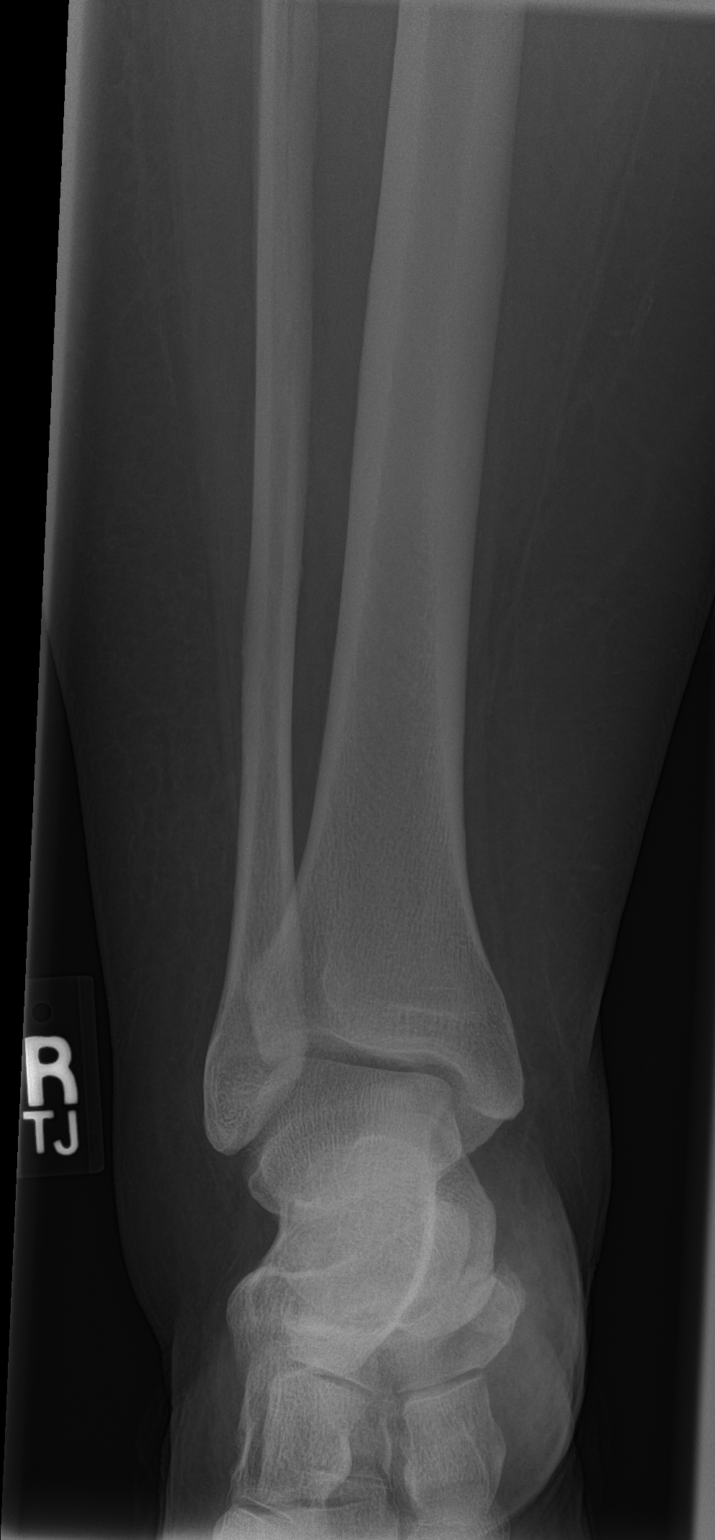

[tibia lat (1 of 2)]
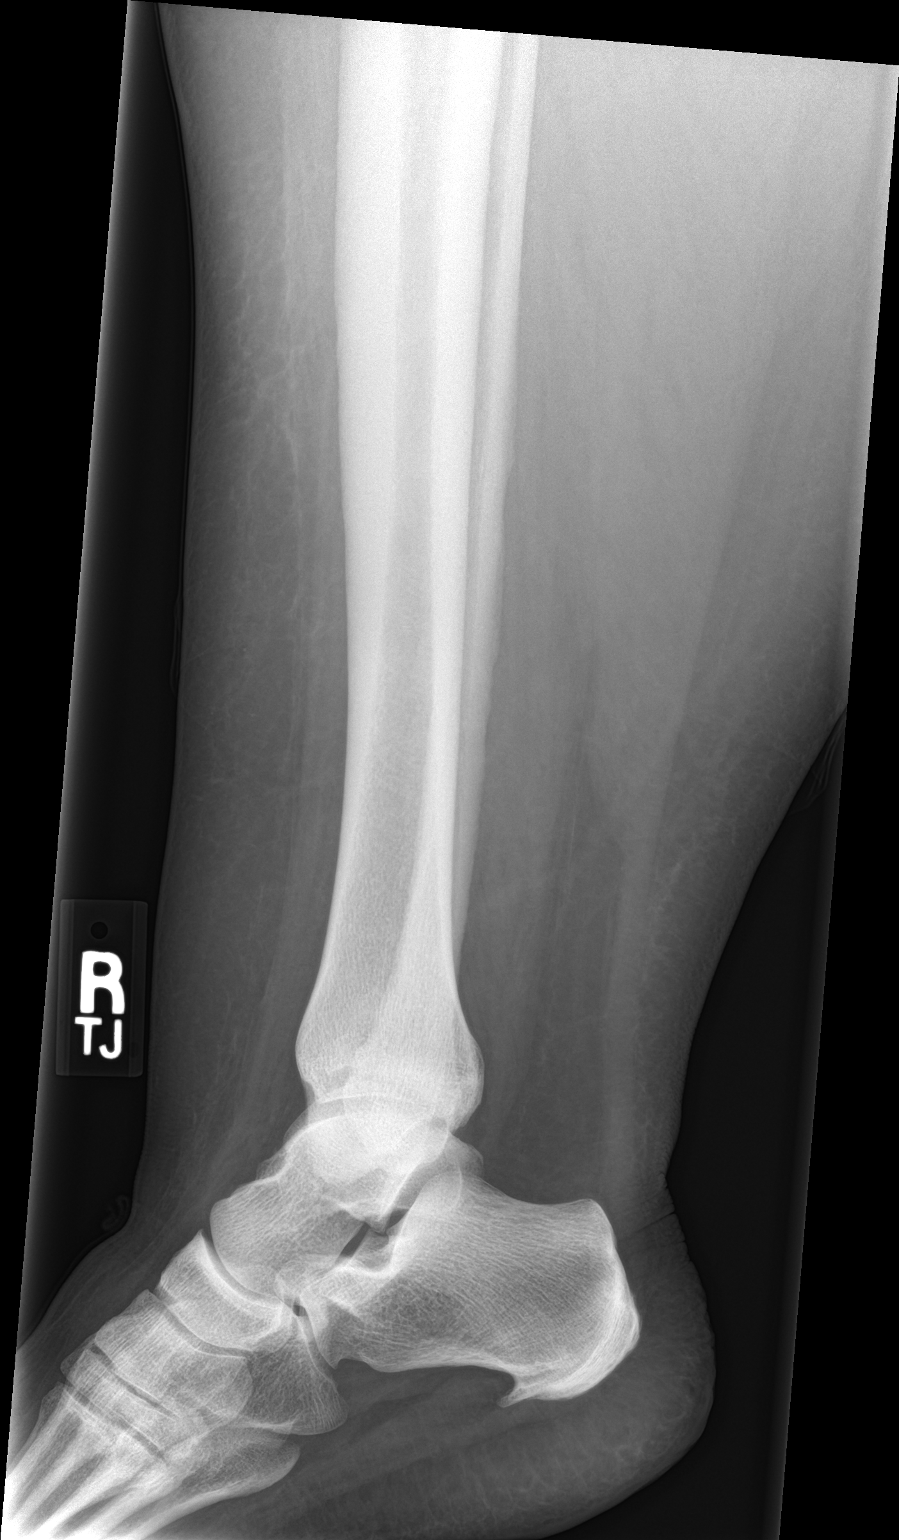

[tibia lat (2 of 2)]
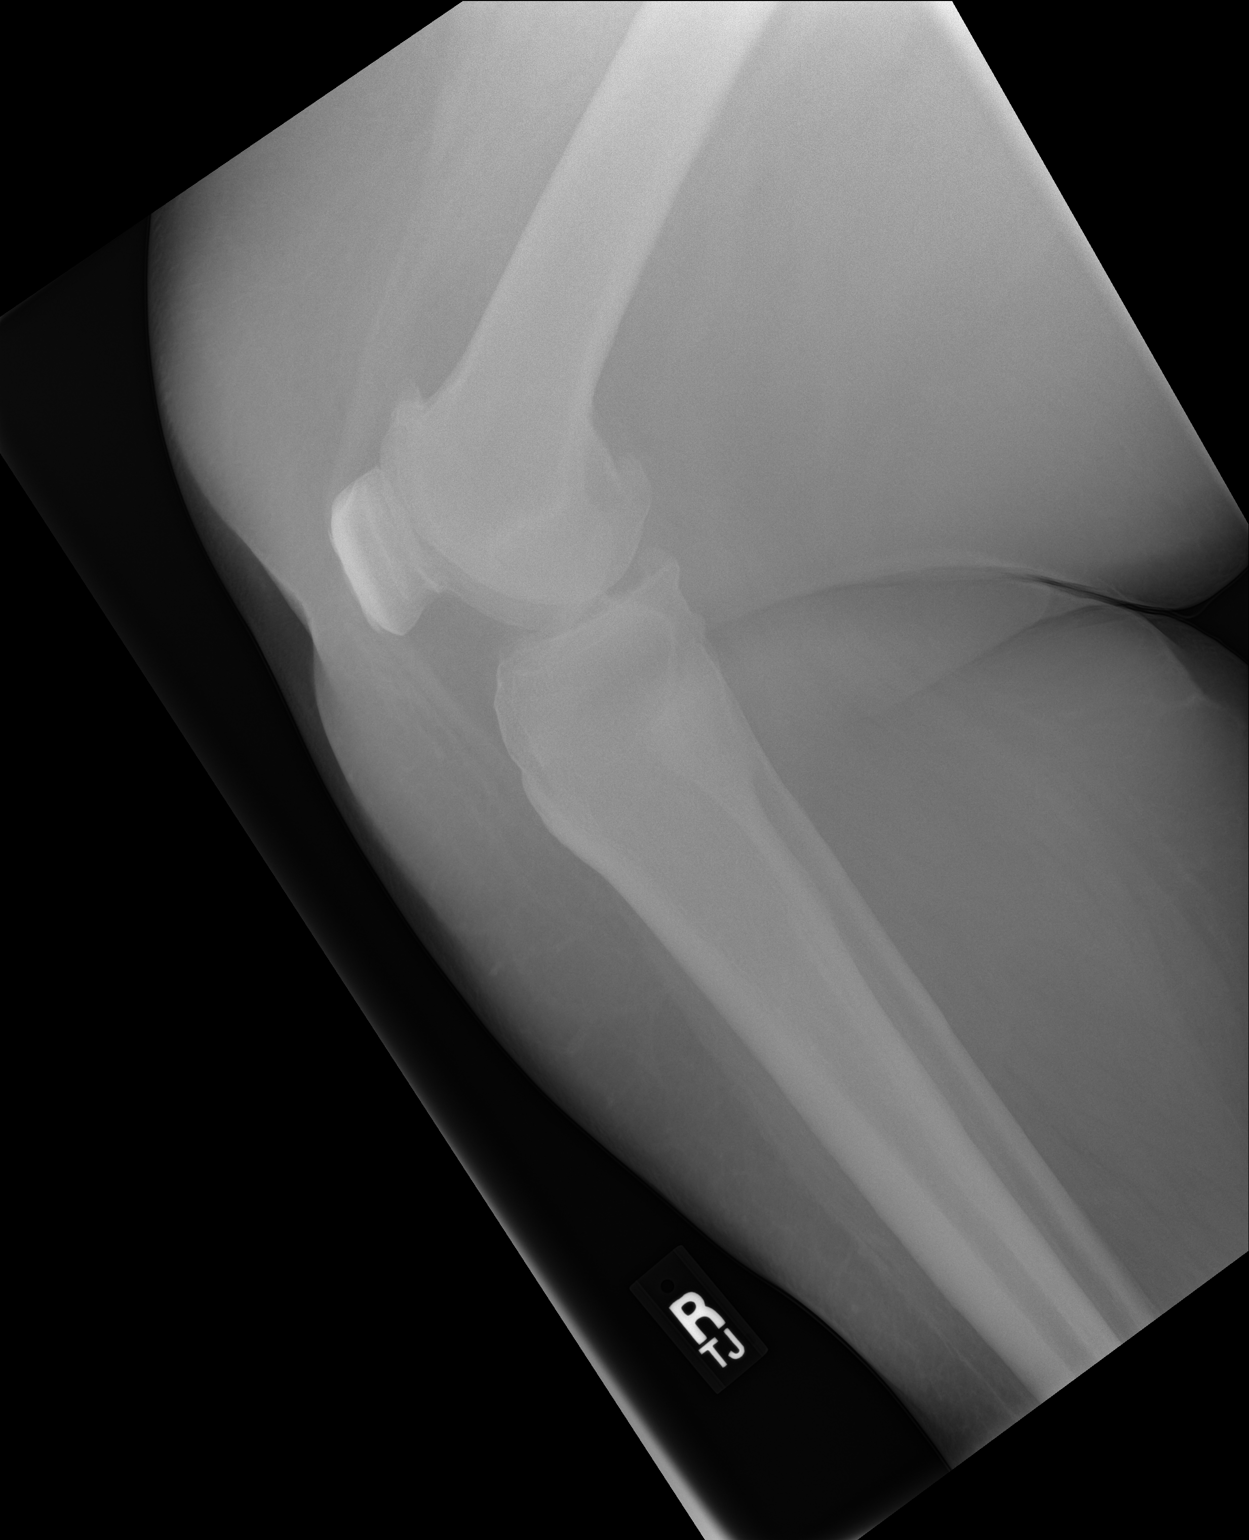

[4 of 4 positions shown; findings below may reference images not displayed]

FINDINGS: No acute osseous or joint abnormality. Incidental imaging of the
knee shows osteophytosis in the lateral and patellofemoral
compartments. Incidental imaging of the foot shows a plantar
calcaneal spur.
IMPRESSION: 1. No osseous abnormality involving the tibia or fibula.
2. Degenerative changes in the knee, incidentally imaged.

## 2021-08-10 ENCOUNTER — Ambulatory Visit (INDEPENDENT_AMBULATORY_CARE_PROVIDER_SITE_OTHER): Payer: BC Managed Care – PPO | Admitting: Psychology

## 2021-08-10 DIAGNOSIS — F339 Major depressive disorder, recurrent, unspecified: Secondary | ICD-10-CM

## 2021-08-10 NOTE — Progress Notes (Signed)
[place] Counselor/Therapist Progress Note ? ?Patient ID: Brandy [name], MRN: [id number]   ? ?Date: 08/10/21 ? ?Time Spent: 12:37 pm - 1:30 pm:  53 Minutes ? ?Treatment Type: Individual Therapy. ? ?Reported Symptoms: Anxiety  ? ?Mental Status Exam: ?Appearance:  Fairly Groomed     ?Behavior: Appropriate  ?Motor: Normal  ?Speech/Language:  Clear and Coherent and Normal Rate  ?Affect: Appropriate  ?Mood: depressed and sad  ?Thought process: normal  ?Thought content:   WNL  ?Sensory/Perceptual disturbances:   WNL  ?Orientation: oriented to person, place, time/date, and situation  ?Attention: Good  ?Concentration: Good  ?Memory: WNL  ?Fund of knowledge:  Good  ?Insight:   Good  ?Judgment:  Good  ?Impulse Control: Good  ? ?Risk Assessment: ?Danger to Self:  No ?Self-injurious Behavior: No ?Danger to Others: No ?Duty to Warn:no ?Physical Aggression / Violence:No  ?Access to Firearms a concern: No  ?Gang Involvement:No  ? ?Subjective:  ? ?Brandy [name] participated car, via video, and consented to treatment. Therapist participated from home office. We met online due to Brandy [name] pandemic. Brandy [name] reviewed the events of the past week. Brandy [name] noted difficulty with her sleep which "screws up everything" and noted her intention to make changing to her "bedding" to improve her sleep temperature. She discussed recent interpersonal stress between her and her [name], [name].  She noted her frustration regarding his response to the situation and discussed her attempts to resolve the situation via communication which was not effective.  We worked on defining her point of frustration in this conversation and identified and insight, in regards to Brandy [name], and, who does not appear to understand her fully in regards to her needs and wants.  She noted this being a longstanding issue in their marriage and expressed a sense of defeat regarding this.  She discussed her lack of optimism regarding a change from mad.   We explored this during the session.  Therapist discussed the importance of communicating needs and follow-up conversations in order to come to consensus in a resolution which includes understanding.  Brandy [name] discussed her worry that Brandy [name] receive any sort of feedback as criticism or judgment.  We discussed her past attempts of giving feedback and the effects on their relationship during that time.  Therapist encouraged Brandy [name] to identify how she really feels and feels and what she needs and we will work, during session, to identify ways to communicate this positively and empathetically.  Brandy [name] continues to engage in pulmonary and Ahab and walk regularly which therapist praised.  Therapist provided supportive therapy and validated Brandy [name]'s feelings and experience. ? ?Interventions: interpersonal stressors and assertive communication. ? ?Interventions: Cognitive Behavioral Therapy and Interpersonal. ? ?Diagnosis: Episode of recurrent major depressive disorder, unspecified depression episode severity ([place] [name]) ? ?Treatment Plan: ? ?Client Abilities/Strengths ?Brandy [name] is intelligent, forthcoming, self-aware, and motivated for change.  ? ?Support System: ?Friend Brandy [name]) and Family.  ? ?Client Treatment Preferences ?Outpatient Therapy. ? ?Client Statement of Needs ?[name] goals for treatment managing her mood and symptoms, processing past events, focus on self-care, set boundaries for self and others, decrease negative self-talk, decrease rumination, process relationship stressors, and improve health.  ? ?Treatment Level ?Weekly ? ?Symptoms ? ?Depressive symptoms: Decreased interest, feeling down, poor sleep, lethargy, psycho-motor retardation, feeling bad about self.  (Status: maintained) ? ?Anxiety: Difficulty managing worry, worrying about different things, restlessness, irritability, afraid something awful might happen  (Status: maintained) ? ?Goals:  ?[name] experiences symptoms of depression and anxiety. ? ? ?  Target Date:  2022-06-08 Frequency: Weekly  ?Progress: 10 Modality: individual  ? ? ?Therapist will provide referrals for additional resources as appropriate.  ?Therapist will provide psycho-education regarding Brandy [name]'s diagnosis and corresponding treatment approaches and interventions. ?Licensed Clinical Social Worker, Brandy Irish, LCSW will support the patient's ability to achieve the goals identified. will employ CBT, BA, Problem-solving, Solution Focused, Mindfulness,  coping skills, & other evidenced-based practices will be used to promote progress towards healthy functioning to help manage decrease symptoms associated with her diagnosis.  ? Reduce overall level, frequency, and intensity of the feelings of depression and anxiety as evidenced by decreased overall symptoms of depression and anxiety from 6 to 7 days/week to 0 to 1 days/week per client report for at least 3 consecutive months. ?Verbally express understanding of the relationship between feelings of depression, anxiety and their impact on thinking patterns and behaviors. ?Verbalize an understanding of the role that distorted thinking plays in creating fears, excessive worry, and ruminations. ? ?(Brandy [name] participated in the creation of the treatment plan) ? ? ? ?Brandy Irish, LCSW ?

## 2021-08-14 ENCOUNTER — Encounter: Payer: BC Managed Care – PPO | Attending: General Surgery

## 2021-08-14 ENCOUNTER — Other Ambulatory Visit: Payer: Self-pay

## 2021-08-14 DIAGNOSIS — G4733 Obstructive sleep apnea (adult) (pediatric): Secondary | ICD-10-CM | POA: Insufficient documentation

## 2021-08-14 DIAGNOSIS — R0600 Dyspnea, unspecified: Secondary | ICD-10-CM

## 2021-08-14 DIAGNOSIS — I5032 Chronic diastolic (congestive) heart failure: Secondary | ICD-10-CM | POA: Insufficient documentation

## 2021-08-14 DIAGNOSIS — R0609 Other forms of dyspnea: Secondary | ICD-10-CM | POA: Insufficient documentation

## 2021-08-14 DIAGNOSIS — E1159 Type 2 diabetes mellitus with other circulatory complications: Secondary | ICD-10-CM | POA: Insufficient documentation

## 2021-08-14 DIAGNOSIS — Z6841 Body Mass Index (BMI) 40.0 and over, adult: Secondary | ICD-10-CM | POA: Insufficient documentation

## 2021-08-14 DIAGNOSIS — R0602 Shortness of breath: Secondary | ICD-10-CM | POA: Insufficient documentation

## 2021-08-14 NOTE — Progress Notes (Signed)
Virtual Visit completed. Patient informed on EP and RD appointment and 6 Minute walk test. Patient also informed of patient health questionnaires on My Chart. Patient Verbalizes understanding. Visit diagnosis can be found in [facility] 04/27/2022. ?

## 2021-08-17 ENCOUNTER — Ambulatory Visit (INDEPENDENT_AMBULATORY_CARE_PROVIDER_SITE_OTHER): Payer: BC Managed Care – PPO | Admitting: Psychology

## 2021-08-17 VITALS — Ht 67.25 in | Wt 295.7 lb

## 2021-08-17 DIAGNOSIS — Z6841 Body Mass Index (BMI) 40.0 and over, adult: Secondary | ICD-10-CM | POA: Diagnosis not present

## 2021-08-17 DIAGNOSIS — G4733 Obstructive sleep apnea (adult) (pediatric): Secondary | ICD-10-CM | POA: Diagnosis not present

## 2021-08-17 DIAGNOSIS — R0600 Dyspnea, unspecified: Secondary | ICD-10-CM

## 2021-08-17 DIAGNOSIS — R0609 Other forms of dyspnea: Secondary | ICD-10-CM | POA: Diagnosis not present

## 2021-08-17 DIAGNOSIS — E1159 Type 2 diabetes mellitus with other circulatory complications: Secondary | ICD-10-CM | POA: Diagnosis not present

## 2021-08-17 DIAGNOSIS — F339 Major depressive disorder, recurrent, unspecified: Secondary | ICD-10-CM | POA: Diagnosis not present

## 2021-08-17 DIAGNOSIS — I5032 Chronic diastolic (congestive) heart failure: Secondary | ICD-10-CM | POA: Diagnosis not present

## 2021-08-17 DIAGNOSIS — R0602 Shortness of breath: Secondary | ICD-10-CM | POA: Diagnosis not present

## 2021-08-17 NOTE — Progress Notes (Signed)
Pulmonary Individual Treatment Plan ? ?Patient Details  ?Name: Brandy [name] ?MRN: [id number] ?Date of Birth: [birth year] ?Referring Provider:   ?Flowsheet Row Pulmonary Rehab from 08/17/2021 in [facility] Cardiac and Pulmonary Rehab  ?Referring Provider Julien Nordmann MD  ? ?  ? ? ?Initial Encounter Date:  ?Flowsheet Row Pulmonary Rehab from 08/17/2021 in [facility] Cardiac and Pulmonary Rehab  ?Date 08/17/21  ? ?  ? ? ?Visit Diagnosis: Dyspnea, unspecified type ? ?Patient's Home Medications on Admission: ? ?Current Outpatient Medications:  ?  acetaminophen (TYLENOL) 650 MG CR tablet, Take 1,300 mg by mouth every 8 (eight) hours as needed for pain., Disp: , Rfl:  ?  atorvastatin (LIPITOR) 10 MG tablet, TAKE 1 TABLET BY MOUTH EVERY DAY FOR CHOLESTEROL, Disp: 90 tablet, Rfl: 2 ?  cetirizine (ZYRTEC) 10 MG tablet, TAKE 1 TABLET (10 MG TOTAL) BY MOUTH DAILY. FOR ALLERGIES, Disp: 90 tablet, Rfl: 1 ?  citalopram (CELEXA) 40 MG tablet, Take 40 mg by mouth daily., Disp: , Rfl:  ?  DULoxetine (CYMBALTA) 30 MG capsule, Take 30 mg by mouth daily. Take with 60 mg to equal 90 mg daily, Disp: , Rfl:  ?  DULoxetine (CYMBALTA) 60 MG capsule, Take 60 mg by mouth daily. Take with 30 mg to equal 90 mg daily, Disp: , Rfl:  ?  folic acid (FOLVITE) 1 MG tablet, Take 1 mg by mouth daily., Disp: , Rfl:  ?  gabapentin (NEURONTIN) 300 MG capsule, TAKE 1 CAPSULE BY MOUTH 3 TIMES A DAY AS NEEDED FOR BACK PAIN, Disp: 270 capsule, Rfl: 3 ?  lamoTRIgine (LAMICTAL) 100 MG tablet, Take 100 mg by mouth every evening., Disp: , Rfl:  ?  lamoTRIgine (LAMICTAL) 200 MG tablet, Take 200 mg by mouth in the morning., Disp: , Rfl:  ?  lisinopril (ZESTRIL) 30 MG tablet, TAKE 1 TABLET (30 MG TOTAL) BY MOUTH DAILY. FOR BLOOD PRESSURE, Disp: 90 tablet, Rfl: 2 ?  metFORMIN (GLUCOPHAGE-XR) 500 MG 24 hr tablet, TAKE 2 TABLETS (1,000 MG TOTAL) BY MOUTH DAILY WITH BREAKFAST. FOR DIABETES., Disp: 180 tablet, Rfl: 3 ?  metroNIDAZOLE (METROCREAM) 0.75 % cream, Apply 1 application  topically daily as needed (rosacea)., Disp: , Rfl:  ?  Multiple Minerals-Vitamins (CALCIUM-MAGNESIUM-ZINC-D3) TABS, Take 2 tablets by mouth daily., Disp: , Rfl:  ?  ondansetron (ZOFRAN-ODT) 4 MG disintegrating tablet, Dissolve 1 tablet (4 mg total) by mouth every 6 (six) hours as needed for nausea or vomiting., Disp: 20 tablet, Rfl: 0 ?  pantoprazole (PROTONIX) 40 MG tablet, Take 1 tablet (40 mg total) by mouth daily., Disp: 90 tablet, Rfl: 0 ?  traZODone (DESYREL) 50 MG tablet, Take 50 mg by mouth at bedtime as needed for sleep., Disp: , Rfl:  ?  Vitamin D, Ergocalciferol, (DRISDOL) 1.25 MG (50000 UNIT) CAPS capsule, TAKE 1 CAPSULE BY MOUTH ONCE WEEKLY FOR VITAMIN D., Disp: 12 capsule, Rfl: 1 ? ?Past Medical History: ?Past Medical History:  ?Diagnosis Date  ? Anxiety   ? Arthritis   ? Back pain   ? Brain fog   ? CHF (congestive heart failure) (HCC)   ? pt not aware of this  ? Depression   ? Diabetes mellitus without complication (HCC)   ? Difficulty walking   ? Dysmetabolic syndrome X   ? Fatigue   ? Fibromyalgia   ? neuropathy  ? Hypertension   ? Hypertriglyceridemia   ? Joint pain   ? Leg weakness   ? Lower extremity edema   ? Muscle atrophy   ?  Neuropathy   ? Obesity   ? Osteoarthritis   ? PONV (postoperative nausea and vomiting)   ? Poor memory   ? Post-menopausal bleeding   ? Prediabetes   ? Rosacea   ? Sleep apnea   ? not on cpap yet  ? Tetanus vaccine causing adverse effect in therapeutic use   ? Vitamin D deficiency   ? ? ?Tobacco Use: ?Social History  ? ?Tobacco Use  ?Smoking Status Never  ?Smokeless Tobacco Never  ? ? ?Labs: ?Review Flowsheet   ? ?  ?  Latest Ref Rng & Units 02/08/2020 06/01/2020 10/04/2020 03/20/2021  ?Labs for [facility] Cardiac and Pulmonary Rehab  ?Cholestrol 0 - 200 mg/dL  110      ?LDL (calc) 0 - 99 mg/dL  49      ?HDL-C >39.00 mg/dL  31.59      ?Trlycerides 0.0 - 149.0 mg/dL  458.5      ?Hemoglobin A1c 4.6 - 6.5 % 7.3   7.0   8.6   7.9    ? ?  06/20/2021  ?Labs for [facility] Cardiac and Pulmonary  Rehab  ?Cholestrol 166    ?LDL (calc) 92    ?HDL-C 40.10    ?Trlycerides 169.0    ?Hemoglobin A1c 7.0    ?  ? ? Multiple values from one day are sorted in reverse-chronological order  ?  ?  ? ? ? ?Pulmonary Assessment Scores: ? Pulmonary Assessment Scores   ? ? Row Name 08/17/21 1703  ?  ?  ?  ? ADL UCSD  ? ADL Phase Entry    ? SOB Score total 85    ? Rest 0    ? Walk 3    ? Stairs 5    ? Bath 3    ? Dress 2    ? Shop 5    ?  ? CAT Score  ? CAT Score 13    ?  ? mMRC Score  ? mMRC Score 2    ? ?  ?  ? ?  ?  ?UCSD: ?Self-administered rating of dyspnea associated with activities of daily living (ADLs) ?6-point scale (0 = "not at all" to 5 = "maximal or unable to do because of breathlessness")  ?Scoring Scores range from 0 to 120.  Minimally important difference is 5 units ? ?CAT: ?CAT can identify the health impairment of COPD patients and is better correlated with disease progression.  ?CAT has a scoring range of zero to 40. The CAT score is classified into four groups of low (less than 10), medium (10 - 20), high (21-30) and very high (31-40) based on the impact level of disease on health status. A CAT score over 10 suggests significant symptoms.  A worsening CAT score could be explained by an exacerbation, poor medication adherence, poor inhaler technique, or progression of COPD or comorbid conditions.  ?CAT MCID is 2 points ? ?mMRC: ?mMRC (Modified Medical Research Council) Dyspnea Scale is used to assess the degree of baseline functional disability in patients of respiratory disease due to dyspnea. ?No minimal important difference is established. A decrease in score of 1 point or greater is considered a positive change.  ? ?Pulmonary Function Assessment: ? Pulmonary Function Assessment - 08/14/21 1123   ? ?  ? Breath  ? Shortness of Breath Limiting activity;Yes   ? ?  ?  ? ?  ? ? ?Exercise Target Goals: ?Exercise Program Goal: ?Individual exercise prescription set using results from initial 6  min walk test and  THRR while considering  patient?s activity barriers and safety.  ? ?Exercise Prescription Goal: ?Initial exercise prescription builds to 30-45 minutes a day of aerobic activity, 2-3 days per week.  Home exercise guidelines will be given to patient during program as part of exercise prescription that the participant will acknowledge. ? ?Education: Aerobic Exercise: ?- Group verbal and visual presentation on the components of exercise prescription. Introduces F.I.T.T principle from ACSM for exercise prescriptions.  Reviews F.I.T.T. principles of aerobic exercise including progression. Written material given at graduation. ?Flowsheet Row Pulmonary Rehab from 08/17/2021 in [facility] Cardiac and Pulmonary Rehab  ?Education need identified 08/17/21  ? ?  ? ? ?Education: Resistance Exercise: ?- Group verbal and visual presentation on the components of exercise prescription. Introduces F.I.T.T principle from ACSM for exercise prescriptions  Reviews F.I.T.T. principles of resistance exercise including progression. Written material given at graduation. ? ?  ?Education: Exercise & Equipment Safety: ?- Individual verbal instruction and demonstration of equipment use and safety with use of the equipment. ?Flowsheet Row Pulmonary Rehab from 08/14/2021 in [facility] Cardiac and Pulmonary Rehab  ?Date 08/14/21  ?Educator [address] [facility]  ?Instruction Review Code 1- Verbalizes Understanding  ? ?  ? ? ?Education: Exercise Physiology & General Exercise Guidelines: ?- Group verbal and written instruction with models to review the exercise physiology of the cardiovascular system and associated critical values. Provides general exercise guidelines with specific guidelines to those with heart or lung disease.  ?Flowsheet Row Pulmonary Rehab from 08/17/2021 in [facility] Cardiac and Pulmonary Rehab  ?Education need identified 08/17/21  ? ?  ? ? ?Education: Flexibility, Balance, Mind/Body Relaxation: ?- Group verbal and visual presentation with interactive activity on the  components of exercise prescription. Introduces F.I.T.T principle from ACSM for exercise prescriptions. Reviews F.I.T.T. principles of flexibility and balance exercise training including progression. Also dis

## 2021-08-17 NOTE — Patient Instructions (Signed)
Patient Instructions ? ?Patient Details  ?Name: Brandy [name] ?MRN: [id number] ?Date of Birth: 07/10/1965 ?Referring Provider:  Antonieta IbaGollan, Timothy J, MD ? ?Below are your personal goals for exercise, nutrition, and risk factors. Our goal is to help you stay on track towards obtaining and maintaining these goals. We will be discussing your progress on these goals with you throughout the program. ? ?Initial Exercise Prescription: ? Initial Exercise Prescription - 08/17/21 1700   ? ?  ? Date of Initial Exercise RX and Referring Provider  ? Date 08/17/21   ? Referring Provider Julien NordmannGollan, Timothy MD   ?  ? Oxygen  ? Maintain Oxygen Saturation 88% or higher   ?  ? NuStep  ? Level 1   ? SPM 80   ? Minutes 15   ? METs 1.4   ?  ? REL-XR  ? Level 1   ? Speed 50   ? Minutes 15   ? METs 1.4   ?  ? T5 Nustep  ? Level 1   ? SPM 80   ? Minutes 15   ? METs 1.4   ?  ? Track  ? Laps 7   ? Minutes 15   ? METs 1.38   ?  ? Prescription Details  ? Frequency (times per week) 3   ? Duration Progress to 30 minutes of continuous aerobic without signs/symptoms of physical distress   ?  ? Intensity  ? THRR 40-80% of Max Heartrate 103 - 142   ? Ratings of Perceived Exertion 11-13   ? Perceived Dyspnea 0-4   ?  ? Progression  ? Progression Continue to progress workloads to maintain intensity without signs/symptoms of physical distress.   ?  ? Resistance Training  ? Training Prescription Yes   ? Weight 4 lb   ? Reps 10-15   ? ?  ?  ? ?  ? ? ?Exercise Goals: ?Frequency: Be able to perform aerobic exercise two to three times per week in program working toward 2-5 days per week of home exercise. ? ?Intensity: Work with a perceived exertion of 11 (fairly light) - 15 (hard) while following your exercise prescription.  We will make changes to your prescription with you as you progress through the program. ?  ?Duration: Be able to do 30 to 45 minutes of continuous aerobic exercise in addition to a 5 minute warm-up and a 5 minute cool-down routine. ?   ?Nutrition Goals: ?Your personal nutrition goals will be established when you do your nutrition analysis with the dietician. ? ?The following are general nutrition guidelines to follow: ?Cholesterol < 200mg /day ?Sodium < 1500mg /day ?Fiber: Women over 50 yrs - 21 grams per day ? ?Personal Goals: ? Personal Goals and Risk Factors at Admission - 08/17/21 1720   ? ?  ? Core Components/Risk Factors/Patient Goals on Admission  ?  Weight Management Yes;Obesity;Weight Loss   ? Intervention Weight Management: Develop a combined nutrition and exercise program designed to reach desired caloric intake, while maintaining appropriate intake of nutrient and fiber, sodium and fats, and appropriate energy expenditure required for the weight goal.;Weight Management: Provide education and appropriate resources to help participant work on and attain dietary goals.;Weight Management/Obesity: Establish reasonable short term and long term weight goals.;Obesity: Provide education and appropriate resources to help participant work on and attain dietary goals.   ? Admit Weight 295 lb (133.8 kg)   ? Goal Weight: Short Term 290 lb (131.5 kg)   ? Goal Weight: Long Term  240 lb (108.9 kg)   ? Expected Outcomes Short Term: Continue to assess and modify interventions until short term weight is achieved;Long Term: Adherence to nutrition and physical activity/exercise program aimed toward attainment of established weight goal;Weight Loss: Understanding of general recommendations for a balanced deficit meal plan, which promotes 1-2 lb weight loss per week and includes a negative energy balance of [phone] kcal/d;Understanding recommendations for meals to include 15-35% energy as protein, 25-35% energy from fat, 35-60% energy from carbohydrates, less than 200mg  of dietary cholesterol, 20-35 gm of total fiber daily;Understanding of distribution of calorie intake throughout the day with the consumption of 4-5 meals/snacks   ? Improve shortness of breath  with ADL's Yes   ? Intervention Provide education, individualized exercise plan and daily activity instruction to help decrease symptoms of SOB with activities of daily living.   ? Expected Outcomes Short Term: Improve cardiorespiratory fitness to achieve a reduction of symptoms when performing ADLs;Long Term: Be able to perform more ADLs without symptoms or delay the onset of symptoms   ? Diabetes Yes   ? Intervention Provide education about signs/symptoms and action to take for hypo/hyperglycemia.;Provide education about proper nutrition, including hydration, and aerobic/resistive exercise prescription along with prescribed medications to achieve blood glucose in normal ranges: Fasting glucose 65-99 mg/dL   ? Expected Outcomes Short Term: Participant verbalizes understanding of the signs/symptoms and immediate care of hyper/hypoglycemia, proper foot care and importance of medication, aerobic/resistive exercise and nutrition plan for blood glucose control.;Long Term: Attainment of HbA1C < 7%.   ? Hypertension Yes   ? Intervention Monitor prescription use compliance.;Provide education on lifestyle modifcations including regular physical activity/exercise, weight management, moderate sodium restriction and increased consumption of fresh fruit, vegetables, and low fat dairy, alcohol moderation, and smoking cessation.   ? Expected Outcomes Short Term: Continued assessment and intervention until BP is < 140/65mm HG in hypertensive participants. < 130/38mm HG in hypertensive participants with diabetes, heart failure or chronic kidney disease.;Long Term: Maintenance of blood pressure at goal levels.   ? Lipids Yes   ? Intervention Provide education and support for participant on nutrition & aerobic/resistive exercise along with prescribed medications to achieve LDL 70mg , HDL >40mg .   ? Expected Outcomes Short Term: Participant states understanding of desired cholesterol values and is compliant with medications  prescribed. Participant is following exercise prescription and nutrition guidelines.;Long Term: Cholesterol controlled with medications as prescribed, with individualized exercise RX and with personalized nutrition plan. Value goals: LDL < 70mg , HDL > 40 mg.   ? ?  ?  ? ?  ? ? ?Tobacco Use Initial Evaluation: ?Social History  ? ?Tobacco Use  ?Smoking Status Never  ?Smokeless Tobacco Never  ? ? ?Exercise Goals and Review: ? Exercise Goals   ? ? Row Name 08/17/21 1719  ?  ?  ?  ?  ?  ? Exercise Goals  ? Increase Physical Activity Yes      ? Intervention Provide advice, education, support and counseling about physical activity/exercise needs.;Develop an individualized exercise prescription for aerobic and resistive training based on initial evaluation findings, risk stratification, comorbidities and participant's personal goals.      ? Expected Outcomes Short Term: Attend rehab on a regular basis to increase amount of physical activity.;Long Term: Add in home exercise to make exercise part of routine and to increase amount of physical activity.;Long Term: Exercising regularly at least 3-5 days a week.      ? Increase Strength and Stamina Yes      ?  Intervention Provide advice, education, support and counseling about physical activity/exercise needs.;Develop an individualized exercise prescription for aerobic and resistive training based on initial evaluation findings, risk stratification, comorbidities and participant's personal goals.      ? Expected Outcomes Short Term: Increase workloads from initial exercise prescription for resistance, speed, and METs.;Short Term: Perform resistance training exercises routinely during rehab and add in resistance training at home;Long Term: Improve cardiorespiratory fitness, muscular endurance and strength as measured by increased METs and functional capacity ( )      ? Able to understand and use rate of perceived exertion (RPE) scale Yes      ? Intervention Provide education  and explanation on how to use RPE scale      ? Expected Outcomes Short Term: Able to use RPE daily in rehab to express subjective intensity level;Long Term:  Able to use RPE to guide intensity level when exercising indepe

## 2021-08-17 NOTE — Progress Notes (Signed)
[facility] Counselor/Therapist Progress Note ? ?Patient ID: Brandy [name], MRN: [id number]   ? ?Date: 08/17/21 ? ?Time Spent: 12:43 pm - 1:30 pm:  47 Minutes ? ?Treatment Type: Individual Therapy. ? ?Reported Symptoms: Anxiety  ? ?Mental Status Exam: ?Appearance:  Fairly Groomed     ?Behavior: Appropriate  ?Motor: Normal  ?Speech/Language:  Clear and Coherent and Normal Rate  ?Affect: Appropriate  ?Mood: depressed and sad  ?Thought process: normal  ?Thought content:   WNL  ?Sensory/Perceptual disturbances:   WNL  ?Orientation: oriented to person, place, time/date, and situation  ?Attention: Good  ?Concentration: Good  ?Memory: WNL  ?Fund of knowledge:  Good  ?Insight:   Good  ?Judgment:  Good  ?Impulse Control: Good  ? ?Risk Assessment: ?Danger to Self:  No ?Self-injurious Behavior: No ?Danger to Others: No ?Duty to Warn:no ?Physical Aggression / Violence:No  ?Access to Firearms a concern: No  ?Gang Involvement:No  ? ?Subjective:  ? ?Brandy [name] participated car, via video, and consented to treatment. Therapist participated from home office. We met online due to [place] pandemic. Brandy [name] reviewed the events of the past week. Brandy [name] noted being slated for an interview for pulmonary rehab. Brandy [name] is slated to have Lamictal (liquid), Citalopram (sublingual), and maintaining the Duloxetine in pill form. 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 She noted her "emotions being pulled" and noted increased recent crying. She continues to struggle with her post-operative diet (80 gm protein) and hydration (64 oz). We explored this during the session including ways to think more positively and work towards the goals via problem-solving and addressing barriers. She discussed continued frustration with Ed's lack of attention during conversations and often interrupting her. We will work on exploring this more, going forward. We employed BA principles to address barriers with hydration  via problem-solving and setting reasonable expectation. Therapist praised Brandy [name] for her positive thinking during the session.  Therapist provided supportive therapy and validated Brandy [name]'s feelings and experience. ? ?Interventions: interpersonal stressors and assertive communication. ? ?Interventions: Cognitive Behavioral Therapy and Interpersonal. ? ?Diagnosis: Episode of recurrent major depressive disorder, unspecified depression episode severity ([place]) ? ?Treatment Plan: ? ?Client Abilities/Strengths ?Brandy [name] is intelligent, forthcoming, self-aware, and motivated for change.  ? ?Support System: ?Friend Brandy [name]) and Family.  ? ?Client Treatment Preferences ?Outpatient Therapy. ? ?Client Statement of Needs ?Brandy [name]'s goals for treatment managing her mood and symptoms, processing past events, focus on self-care, set boundaries for self and others, decrease negative self-talk, decrease rumination, process relationship stressors, and improve health.  ? ?Treatment Level ?Weekly ? ?Symptoms ? ?Depressive symptoms: Decreased interest, feeling down, poor sleep, lethargy, psycho-motor retardation, feeling bad about self.  (Status: maintained) ? ?Anxiety: Difficulty managing worry, worrying about different things, restlessness, irritability, afraid something awful might happen  (Status: maintained) ? ?Goals:  ?Brandy [name] experiences symptoms of depression and anxiety. ? ? ?Target Date: 2022-06-08 Frequency: Weekly  ?Progress: 10 Modality: individual  ? ? ?Therapist will provide referrals for additional resources as appropriate.  ?Therapist will provide psycho-education regarding Brandy [name]'s diagnosis and corresponding treatment approaches and interventions. ?Licensed Clinical Social Worker, [name], LCSW will support the patient's ability to achieve the goals identified. will employ CBT, BA, Problem-solving, Solution Focused, Mindfulness,  coping skills, & other evidenced-based practices will be used to promote progress towards healthy  functioning to help manage decrease symptoms associated with her diagnosis.  ? Reduce overall level, frequency, and intensity of the feelings of depression and anxiety as evidenced by  decreased overall symptoms of depression and anxiety from 6 to 7 days/week to 0 to 1 days/week per client report for at least 3 consecutive months. ?Verbally express understanding of the relationship between feelings of depression, anxiety and their impact on thinking patterns and behaviors. ?Verbalize an understanding of the role that distorted thinking plays in creating fears, excessive worry, and ruminations. ? ?(Brandy [name] participated in the creation of the treatment plan) ? ? ? ?[name], LCSW ?

## 2021-08-21 ENCOUNTER — Encounter: Payer: BC Managed Care – PPO | Admitting: *Deleted

## 2021-08-21 DIAGNOSIS — R0602 Shortness of breath: Secondary | ICD-10-CM | POA: Diagnosis not present

## 2021-08-21 DIAGNOSIS — I5032 Chronic diastolic (congestive) heart failure: Secondary | ICD-10-CM | POA: Diagnosis not present

## 2021-08-21 DIAGNOSIS — E1159 Type 2 diabetes mellitus with other circulatory complications: Secondary | ICD-10-CM | POA: Diagnosis not present

## 2021-08-21 DIAGNOSIS — G4733 Obstructive sleep apnea (adult) (pediatric): Secondary | ICD-10-CM | POA: Diagnosis not present

## 2021-08-21 DIAGNOSIS — R0609 Other forms of dyspnea: Secondary | ICD-10-CM | POA: Diagnosis not present

## 2021-08-21 DIAGNOSIS — R0600 Dyspnea, unspecified: Secondary | ICD-10-CM

## 2021-08-21 DIAGNOSIS — Z6841 Body Mass Index (BMI) 40.0 and over, adult: Secondary | ICD-10-CM | POA: Diagnosis not present

## 2021-08-21 NOTE — Progress Notes (Signed)
Daily Session Note ? ?Patient Details  ?Name: Brandy [name] ?MRN: [id number] ?Date of Birth: 11-22-1965 ?Referring Provider:   ?Flowsheet Row Pulmonary Rehab from 08/17/2021 in [facility] Cardiac and Pulmonary Rehab  ?Referring Provider Ida Rogue MD  ? ?  ? ? ?Encounter Date: 08/21/2021 ? ?Check In: ? Session Check In - 08/21/21 1014   ? ?  ? Check-In  ? Supervising physician immediately available to respond to emergencies See telemetry face sheet for immediately available ER MD   ? Location [facility]-Cardiac & Pulmonary Rehab   ? Staff Present Renita Papa, RN Moises Blood, BS, ACSM CEP, Exercise Physiologist;Kara Eliezer Bottom, MS, ASCM CEP, Exercise Physiologist   ? Virtual Visit No   ? Medication changes reported     No   ? Fall or balance concerns reported    No   ? Warm-up and Cool-down Performed on first and last piece of equipment   ? Resistance Training Performed Yes   ? VAD Patient? No   ? PAD/SET Patient? No   ?  ? Pain Assessment  ? Currently in Pain? No/denies   ? ?  ?  ? ?  ? ? ? ? ? ?Social History  ? ?Tobacco Use  ?Smoking Status Never  ?Smokeless Tobacco Never  ? ? ?Goals Met:  ?Independence with exercise equipment ?Exercise tolerated well ?No report of concerns or symptoms today ?Strength training completed today ? ?Goals Unmet:  ?Not Applicable ? ?Comments: First full day of exercise!  Patient was oriented to gym and equipment including functions, settings, policies, and procedures.  Patient's individual exercise prescription and treatment plan were reviewed.  All starting workloads were established based on the results of the 6 minute walk test done at initial orientation visit.  The plan for exercise progression was also introduced and progression will be customized based on patient's performance and goals. ? ? ? ?Dr. Emily Filbert is Medical Director for [facility].  ?Dr. Ottie Glazier is Medical Director for [facility] Pulmonary Rehabilitation. ?

## 2021-08-24 ENCOUNTER — Ambulatory Visit: Payer: BC Managed Care – PPO | Admitting: Psychology

## 2021-08-30 ENCOUNTER — Encounter: Payer: BC Managed Care – PPO | Attending: General Surgery

## 2021-08-30 DIAGNOSIS — R0602 Shortness of breath: Secondary | ICD-10-CM | POA: Diagnosis not present

## 2021-08-30 DIAGNOSIS — R0600 Dyspnea, unspecified: Secondary | ICD-10-CM

## 2021-08-30 DIAGNOSIS — E1159 Type 2 diabetes mellitus with other circulatory complications: Secondary | ICD-10-CM | POA: Diagnosis not present

## 2021-08-30 DIAGNOSIS — I5032 Chronic diastolic (congestive) heart failure: Secondary | ICD-10-CM | POA: Insufficient documentation

## 2021-08-30 DIAGNOSIS — R0609 Other forms of dyspnea: Secondary | ICD-10-CM | POA: Diagnosis not present

## 2021-08-30 NOTE — Progress Notes (Signed)
Daily Session Note ? ?Patient Details  ?Name: Brandy [name] ?MRN: [id number] ?Date of Birth: 1965-07-14 ?Referring Provider:   ?Flowsheet Row Pulmonary Rehab from 08/17/2021 in [facility] Cardiac and Pulmonary Rehab  ?Referring Provider Ida Rogue MD  ? ?  ? ? ?Encounter Date: 08/30/2021 ? ?Check In: ? Session Check In - 08/30/21 1105   ? ?  ? Check-In  ? Supervising physician immediately available to respond to emergencies See telemetry face sheet for immediately available ER MD   ? Location [facility]-Cardiac & Pulmonary Rehab   ? Staff Present Birdie Sons, MPA, RN;Melissa Hahira, RDN, LDN;Joseph [place], RCP,RRT,BSRT   ? Virtual Visit No   ? Medication changes reported     No   ? Fall or balance concerns reported    No   ? Warm-up and Cool-down Performed on first and last piece of equipment   ? Resistance Training Performed Yes   ? VAD Patient? No   ? PAD/SET Patient? No   ?  ? Pain Assessment  ? Currently in Pain? No/denies   ? ?  ?  ? ?  ? ? ? ? ? ?Social History  ? ?Tobacco Use  ?Smoking Status Never  ?Smokeless Tobacco Never  ? ? ?Goals Met:  ?Independence with exercise equipment ?Exercise tolerated well ?No report of concerns or symptoms today ?Strength training completed today ? ?Goals Unmet:  ?Not Applicable ? ?Comments: Pt able to follow exercise prescription today without complaint.  Will continue to monitor for progression. ? ? ? ?Dr. Emily Filbert is Medical Director for [place].  ?Dr. Ottie Glazier is Medical Director for [facility] Pulmonary Rehabilitation. ?

## 2021-08-31 ENCOUNTER — Ambulatory Visit: Payer: BC Managed Care – PPO | Admitting: Psychology

## 2021-09-01 ENCOUNTER — Encounter: Payer: BC Managed Care – PPO | Admitting: *Deleted

## 2021-09-01 DIAGNOSIS — R0600 Dyspnea, unspecified: Secondary | ICD-10-CM

## 2021-09-01 DIAGNOSIS — R0609 Other forms of dyspnea: Secondary | ICD-10-CM | POA: Diagnosis not present

## 2021-09-01 DIAGNOSIS — R0602 Shortness of breath: Secondary | ICD-10-CM | POA: Diagnosis not present

## 2021-09-01 DIAGNOSIS — I5032 Chronic diastolic (congestive) heart failure: Secondary | ICD-10-CM | POA: Diagnosis not present

## 2021-09-01 DIAGNOSIS — E1159 Type 2 diabetes mellitus with other circulatory complications: Secondary | ICD-10-CM | POA: Diagnosis not present

## 2021-09-01 NOTE — Progress Notes (Signed)
Daily Session Note ? ?Patient Details  ?Name: Brandy [name] ?MRN: [id number] ?Date of Birth: 1965-12-29 ?Referring Provider:   ?Flowsheet Row Pulmonary Rehab from 08/17/2021 in [facility] Cardiac and Pulmonary Rehab  ?Referring Provider Ida Rogue MD  ? ?  ? ? ?Encounter Date: 09/01/2021 ? ?Check In: ? Session Check In - 09/01/21 1137   ? ?  ? Check-In  ? Supervising physician immediately available to respond to emergencies See telemetry face sheet for immediately available ER MD   ? Location [facility]-Cardiac & Pulmonary Rehab   ? Staff Present Heath Lark, RN, BSN, CCRP;Jessica [place], MA, RCEP, CCRP, CCET;Joseph [place], [place]   ? Virtual Visit No   ? Medication changes reported     No   ? Fall or balance concerns reported    No   ? Warm-up and Cool-down Performed on first and last piece of equipment   ? Resistance Training Performed Yes   ? VAD Patient? No   ? PAD/SET Patient? No   ?  ? Pain Assessment  ? Currently in Pain? No/denies   ? ?  ?  ? ?  ? ? ? ? ? ?Social History  ? ?Tobacco Use  ?Smoking Status Never  ?Smokeless Tobacco Never  ? ? ?Goals Met:  ?Proper associated with RPD/PD & O2 Sat ?Independence with exercise equipment ?Exercise tolerated well ?No report of concerns or symptoms today ? ?Goals Unmet:  ?Not Applicable ? ?Comments: Pt able to follow exercise prescription today without complaint.  Will continue to monitor for progression. ? ? ? ?Dr. Emily Filbert is Medical Director for [place].  ?Dr. Ottie Glazier is Medical Director for [facility] Pulmonary Rehabilitation. ?

## 2021-09-04 DIAGNOSIS — I5032 Chronic diastolic (congestive) heart failure: Secondary | ICD-10-CM | POA: Diagnosis not present

## 2021-09-04 DIAGNOSIS — R0602 Shortness of breath: Secondary | ICD-10-CM | POA: Diagnosis not present

## 2021-09-04 DIAGNOSIS — E1159 Type 2 diabetes mellitus with other circulatory complications: Secondary | ICD-10-CM | POA: Diagnosis not present

## 2021-09-04 DIAGNOSIS — R0609 Other forms of dyspnea: Secondary | ICD-10-CM | POA: Diagnosis not present

## 2021-09-04 DIAGNOSIS — R0600 Dyspnea, unspecified: Secondary | ICD-10-CM

## 2021-09-04 NOTE — Progress Notes (Signed)
Daily Session Note ? ?Patient Details  ?Name: Brandy [name] ?MRN: [id number] ?Date of Birth: 10/27/1965 ?Referring Provider:   ?Flowsheet Row Pulmonary Rehab from 08/17/2021 in [facility] Cardiac and Pulmonary Rehab  ?Referring Provider Gollan, Timothy MD  ? ?  ? ? ?Encounter Date: 09/04/2021 ? ?Check In: ? Session Check In - 09/04/21 1100   ? ?  ? Check-In  ? Supervising physician immediately available to respond to emergencies See telemetry face sheet for immediately available ER MD   ? Location [facility]-Cardiac & Pulmonary Rehab   ? Staff Present Kelly Bollinger, MPA, RN;Melissa Caiola, RDN, LDN;Kelly Hayes, BS, ACSM CEP, Exercise Physiologist;Kara Langdon, MS, ASCM CEP, Exercise Physiologist   ? Virtual Visit No   ? Medication changes reported     No   ? Fall or balance concerns reported    No   ? Warm-up and Cool-down Performed on first and last piece of equipment   ? Resistance Training Performed Yes   ? VAD Patient? No   ? PAD/SET Patient? No   ?  ? Pain Assessment  ? Currently in Pain? No/denies   ? ?  ?  ? ?  ? ? ? ? ? ?Social History  ? ?Tobacco Use  ?Smoking Status Never  ?Smokeless Tobacco Never  ? ? ?Goals Met:  ?Independence with exercise equipment ?Exercise tolerated well ?No report of concerns or symptoms today ?Strength training completed today ? ?Goals Unmet:  ?Not Applicable ? ?Comments: Pt able to follow exercise prescription today without complaint.  Will continue to monitor for progression. ? ? ? ?Dr. Mark Miller is Medical Director for [facility].  ?Dr. Fuad Aleskerov is Medical Director for [facility] Pulmonary Rehabilitation. ?

## 2021-09-06 DIAGNOSIS — G4733 Obstructive sleep apnea (adult) (pediatric): Secondary | ICD-10-CM | POA: Diagnosis not present

## 2021-09-06 DIAGNOSIS — K912 Postsurgical malabsorption, not elsewhere classified: Secondary | ICD-10-CM | POA: Diagnosis not present

## 2021-09-06 DIAGNOSIS — E119 Type 2 diabetes mellitus without complications: Secondary | ICD-10-CM | POA: Diagnosis not present

## 2021-09-07 ENCOUNTER — Ambulatory Visit (INDEPENDENT_AMBULATORY_CARE_PROVIDER_SITE_OTHER): Payer: BC Managed Care – PPO | Admitting: Psychology

## 2021-09-07 DIAGNOSIS — F339 Major depressive disorder, recurrent, unspecified: Secondary | ICD-10-CM | POA: Diagnosis not present

## 2021-09-07 NOTE — Progress Notes (Signed)
[facility] Counselor/Therapist Progress Note ? ?Patient ID: Brandy [name], MRN: [id number]   ? ?Date: 09/07/21 ? ?Time Spent: 12:35 pm 1:30 pm:  55 Minutes ? ?Treatment Type: Individual Therapy. ? ?Reported Symptoms: Anxiety  ? ?Mental Status Exam: ?Appearance:  Fairly Groomed     ?Behavior: Appropriate  ?Motor: Normal  ?Speech/Language:  Clear and Coherent and Normal Rate  ?Affect: Appropriate  ?Mood: depressed and sad  ?Thought process: normal  ?Thought content:   WNL  ?Sensory/Perceptual disturbances:   WNL  ?Orientation: oriented to person, place, time/date, and situation  ?Attention: Good  ?Concentration: Good  ?Memory: WNL  ?Fund of knowledge:  Good  ?Insight:   Good  ?Judgment:  Good  ?Impulse Control: Good  ? ?Risk Assessment: ?Danger to Self:  No ?Self-injurious Behavior: No ?Danger to Others: No ?Duty to Warn:no ?Physical Aggression / Violence:No  ?Access to Firearms a concern: No  ?Gang Involvement:No  ? ?Subjective:  ? ?Brandy [name] participated car, via video, and consented to treatment. Therapist participated from home office. We met online due to Brandy [name] pandemic. Brandy [name] reviewed the events of the past week. She noted being accepted to pulmonary rehab and her positive emotions regarding this. She noted a follow-up with her bariatric surgeons and noted her work on her health related issues.  She noted a strain with her daughter and being unsure as to the cause of the stress. She noted walking on eggshells with her daughter.  We explored these stressors, during the session, and Brandy [name]'s inability to provide her daughter feedback without cost ie poor reaction, punishment, or disconnecting from [place] in response. Brandy [name] noted feelings of being powerless in the situation. We worked on identifying boundaries during the session and ways to be assertive and communication positively and assertively. Brandy [name] was engaged and motivated during the session. Therapist praised [name] for her positive  thinking during the session.  Therapist provided supportive therapy and validated Brandy [name]'s feelings and experience. ? ?Interventions: interpersonal stressors and assertive communication. ? ?Interventions: Cognitive Behavioral Therapy and Interpersonal. ? ?Diagnosis: Episode of recurrent major depressive disorder, unspecified depression episode severity ([place]) ? ?Treatment Plan: ? ?Client Abilities/Strengths ?Brandy [name] is intelligent, forthcoming, self-aware, and motivated for change.  ? ?Support System: ?Friend Brandy [name]) and Family.  ? ?Client Treatment Preferences ?Outpatient Therapy. ? ?Client Statement of Needs ?Brandy [name]'s goals for treatment managing her mood and symptoms, processing past events, focus on self-care, set boundaries for self and others, decrease negative self-talk, decrease rumination, process relationship stressors, and improve health.  ? ?Treatment Level ?Weekly ? ?Symptoms ? ?Depressive symptoms: Decreased interest, feeling down, poor sleep, lethargy, psycho-motor retardation, feeling bad about self.  (Status: maintained) ? ?Anxiety: Difficulty managing worry, worrying about different things, restlessness, irritability, afraid something awful might happen  (Status: maintained) ? ?Goals:  ?Brandy [name] experiences symptoms of depression and anxiety. ? ? ?Target Date: 2022-06-08 Frequency: Weekly  ?Progress: 10 Modality: individual  ? ? ?Therapist will provide referrals for additional resources as appropriate.  ?Therapist will provide psycho-education regarding Brandy [name]'s diagnosis and corresponding treatment approaches and interventions. ?Licensed Clinical Social Worker, [name], LCSW will support the patient's ability to achieve the goals identified. will employ CBT, BA, Problem-solving, Solution Focused, Mindfulness,  coping skills, & other evidenced-based practices will be used to promote progress towards healthy functioning to help manage decrease symptoms associated with her diagnosis.  ? Reduce overall level,  frequency, and intensity of the feelings of depression and anxiety as evidenced by decreased overall symptoms of depression and  anxiety from 6 to 7 days/week to 0 to 1 days/week per client report for at least 3 consecutive months. ?Verbally express understanding of the relationship between feelings of depression, anxiety and their impact on thinking patterns and behaviors. ?Verbalize an understanding of the role that distorted thinking plays in creating fears, excessive worry, and ruminations. ? ?(Brandy [name] participated in the creation of the treatment plan) ? ? ? ?[name], LCSW ?

## 2021-09-08 ENCOUNTER — Encounter: Payer: BC Managed Care – PPO | Admitting: *Deleted

## 2021-09-08 DIAGNOSIS — R0600 Dyspnea, unspecified: Secondary | ICD-10-CM | POA: Diagnosis not present

## 2021-09-08 DIAGNOSIS — I5032 Chronic diastolic (congestive) heart failure: Secondary | ICD-10-CM | POA: Diagnosis not present

## 2021-09-08 DIAGNOSIS — R0602 Shortness of breath: Secondary | ICD-10-CM | POA: Diagnosis not present

## 2021-09-08 DIAGNOSIS — E1159 Type 2 diabetes mellitus with other circulatory complications: Secondary | ICD-10-CM | POA: Diagnosis not present

## 2021-09-08 DIAGNOSIS — R0609 Other forms of dyspnea: Secondary | ICD-10-CM | POA: Diagnosis not present

## 2021-09-08 NOTE — Progress Notes (Signed)
Daily Session Note ? ?Patient Details  ?Name: Brandy [name] ?MRN: [id number] ?Date of Birth: May 30, 1965 ?Referring Provider:   ?Flowsheet Row Pulmonary Rehab from 08/17/2021 in [facility] Cardiac and Pulmonary Rehab  ?Referring Provider Ida Rogue MD  ? ?  ? ? ?Encounter Date: 09/08/2021 ? ?Check In: ? Session Check In - 09/08/21 1136   ? ?  ? Check-In  ? Supervising physician immediately available to respond to emergencies See telemetry face sheet for immediately available ER MD   ? Location [facility]-Cardiac & Pulmonary Rehab   ? Staff Present Heath Lark, RN, BSN, CCRP;Laureen Owens Shark, BS, RRT, CPFT;Joseph [place], [place]   ? Virtual Visit No   ? Medication changes reported     No   ? Fall or balance concerns reported    No   ? Warm-up and Cool-down Performed on first and last piece of equipment   ? Resistance Training Performed Yes   ? VAD Patient? No   ? PAD/SET Patient? No   ?  ? Pain Assessment  ? Currently in Pain? No/denies   ? ?  ?  ? ?  ? ? ? ? ? ?Social History  ? ?Tobacco Use  ?Smoking Status Never  ?Smokeless Tobacco Never  ? ? ?Goals Met:  ?Proper associated with RPD/PD & O2 Sat ?Independence with exercise equipment ?Exercise tolerated well ?No report of concerns or symptoms today ? ?Goals Unmet:  ?Not Applicable ? ?Comments: Pt able to follow exercise prescription today without complaint.  Will continue to monitor for progression. ? ? ? ?Dr. Emily Filbert is Medical Director for [place].  ?Dr. Ottie Glazier is Medical Director for [facility] Pulmonary Rehabilitation. ?

## 2021-09-11 ENCOUNTER — Encounter: Payer: BC Managed Care – PPO | Admitting: *Deleted

## 2021-09-11 DIAGNOSIS — R0600 Dyspnea, unspecified: Secondary | ICD-10-CM | POA: Diagnosis not present

## 2021-09-11 DIAGNOSIS — E1159 Type 2 diabetes mellitus with other circulatory complications: Secondary | ICD-10-CM | POA: Diagnosis not present

## 2021-09-11 DIAGNOSIS — I5032 Chronic diastolic (congestive) heart failure: Secondary | ICD-10-CM | POA: Diagnosis not present

## 2021-09-11 DIAGNOSIS — R0609 Other forms of dyspnea: Secondary | ICD-10-CM | POA: Diagnosis not present

## 2021-09-11 DIAGNOSIS — R0602 Shortness of breath: Secondary | ICD-10-CM | POA: Diagnosis not present

## 2021-09-11 NOTE — Progress Notes (Signed)
Daily Session Note ? ?Patient Details  ?Name: Brandy [name] ?MRN: [id number] ?Date of Birth: 1966-02-17 ?Referring Provider:   ?Flowsheet Row Pulmonary Rehab from 08/17/2021 in [facility] Cardiac and Pulmonary Rehab  ?Referring Provider Ida Rogue MD  ? ?  ? ? ?Encounter Date: 09/11/2021 ? ?Check In: ? Session Check In - 09/11/21 1122   ? ?  ? Check-In  ? Supervising physician immediately available to respond to emergencies See telemetry face sheet for immediately available ER MD   ? Location [facility]-Cardiac & Pulmonary Rehab   ? Staff Present Heath Lark, RN, BSN, [employer], MPA, RN;Kelly Amedeo Plenty, BS, ACSM CEP, Exercise Physiologist;Kara Eliezer Bottom, MS, ASCM CEP, Exercise Physiologist   ? Virtual Visit No   ? Medication changes reported     No   ? Fall or balance concerns reported    No   ? Warm-up and Cool-down Performed on first and last piece of equipment   ? Resistance Training Performed Yes   ? VAD Patient? No   ? PAD/SET Patient? No   ?  ? Pain Assessment  ? Currently in Pain? No/denies   ? ?  ?  ? ?  ? ? ? ? ? ?Social History  ? ?Tobacco Use  ?Smoking Status Never  ?Smokeless Tobacco Never  ? ? ?Goals Met:  ?Proper associated with RPD/PD & O2 Sat ?Independence with exercise equipment ?Using PLB without cueing & demonstrates good technique ?Exercise tolerated well ?No report of concerns or symptoms today ?Strength training completed today ? ?Goals Unmet:  ?Not Applicable ? ?Comments: Pt able to follow exercise prescription today without complaint.  Will continue to monitor for progression. ? ? ? ?Dr. Emily Filbert is Medical Director for [place].  ?Dr. Ottie Glazier is Medical Director for [facility] Pulmonary Rehabilitation. ?

## 2021-09-13 ENCOUNTER — Encounter: Payer: Self-pay | Admitting: *Deleted

## 2021-09-13 DIAGNOSIS — R0600 Dyspnea, unspecified: Secondary | ICD-10-CM

## 2021-09-13 DIAGNOSIS — R0602 Shortness of breath: Secondary | ICD-10-CM | POA: Diagnosis not present

## 2021-09-13 DIAGNOSIS — I5032 Chronic diastolic (congestive) heart failure: Secondary | ICD-10-CM | POA: Diagnosis not present

## 2021-09-13 DIAGNOSIS — R0609 Other forms of dyspnea: Secondary | ICD-10-CM | POA: Diagnosis not present

## 2021-09-13 DIAGNOSIS — E1159 Type 2 diabetes mellitus with other circulatory complications: Secondary | ICD-10-CM | POA: Diagnosis not present

## 2021-09-13 NOTE — Progress Notes (Signed)
Daily Session Note ? ?Patient Details  ?Name: Denis Koppel Uhls ?MRN: [id number] ?Date of Birth: Mar 26, 1966 ?Referring Provider:   ?Flowsheet Row Pulmonary Rehab from 08/17/2021 in [facility] Cardiac and Pulmonary Rehab  ?Referring Provider Ida Rogue MD  ? ?  ? ? ?Encounter Date: 09/13/2021 ? ?Check In: ? Session Check In - 09/13/21 1102   ? ?  ? Check-In  ? Supervising physician immediately available to respond to emergencies See telemetry face sheet for immediately available ER MD   ? Location [facility]-Cardiac & Pulmonary Rehab   ? Staff Present Birdie Sons, MPA, RN;Melissa [place]rose, RDN, LDN;Joseph [place], RCP,RRT,BSRT;Heath Lark, RN, BSN, CCRP   ? Virtual Visit No   ? Medication changes reported     No   ? Fall or balance concerns reported    No   ? Warm-up and Cool-down Performed on first and last piece of equipment   ? Resistance Training Performed Yes   ? VAD Patient? No   ? PAD/SET Patient? No   ?  ? Pain Assessment  ? Currently in Pain? No/denies   ? ?  ?  ? ?  ? ? ? ? ? ?Social History  ? ?Tobacco Use  ?Smoking Status Never  ?Smokeless Tobacco Never  ? ? ?Goals Met:  ?Independence with exercise equipment ?Exercise tolerated well ?No report of concerns or symptoms today ?Strength training completed today ? ?Goals Unmet:  ?Not Applicable ? ?Comments: Pt able to follow exercise prescription today without complaint.  Will continue to monitor for progression. ? ? ? ?Dr. Emily Filbert is Medical Director for [place].  ?Dr. Ottie Glazier is Medical Director for [facility] Pulmonary Rehabilitation. ?

## 2021-09-13 NOTE — Progress Notes (Signed)
Pulmonary Individual Treatment Plan ? ?Patient Details  ?Name: Brandy [name] ?MRN: [id number] ?Date of Birth: [birth year] ?Referring Provider:   ?Flowsheet Row Pulmonary Rehab from 08/17/2021 in [facility] Cardiac and Pulmonary Rehab  ?Referring Provider Julien Nordmann MD  ? ?  ? ? ?Initial Encounter Date:  ?Flowsheet Row Pulmonary Rehab from 08/17/2021 in [facility] Cardiac and Pulmonary Rehab  ?Date 08/17/21  ? ?  ? ? ?Visit Diagnosis: Dyspnea, unspecified type ? ?Patient's Home Medications on Admission: ? ?Current Outpatient Medications:  ?  acetaminophen (TYLENOL) 650 MG CR tablet, Take 1,300 mg by mouth every 8 (eight) hours as needed for pain., Disp: , Rfl:  ?  atorvastatin (LIPITOR) 10 MG tablet, TAKE 1 TABLET BY MOUTH EVERY DAY FOR CHOLESTEROL, Disp: 90 tablet, Rfl: 2 ?  cetirizine (ZYRTEC) 10 MG tablet, TAKE 1 TABLET (10 MG TOTAL) BY MOUTH DAILY. FOR ALLERGIES, Disp: 90 tablet, Rfl: 1 ?  citalopram (CELEXA) 40 MG tablet, Take 40 mg by mouth daily., Disp: , Rfl:  ?  DULoxetine (CYMBALTA) 30 MG capsule, Take 30 mg by mouth daily. Take with 60 mg to equal 90 mg daily, Disp: , Rfl:  ?  DULoxetine (CYMBALTA) 60 MG capsule, Take 60 mg by mouth daily. Take with 30 mg to equal 90 mg daily, Disp: , Rfl:  ?  folic acid (FOLVITE) 1 MG tablet, Take 1 mg by mouth daily., Disp: , Rfl:  ?  gabapentin (NEURONTIN) 300 MG capsule, TAKE 1 CAPSULE BY MOUTH 3 TIMES A DAY AS NEEDED FOR BACK PAIN, Disp: 270 capsule, Rfl: 3 ?  lamoTRIgine (LAMICTAL) 100 MG tablet, Take 100 mg by mouth every evening., Disp: , Rfl:  ?  lamoTRIgine (LAMICTAL) 200 MG tablet, Take 200 mg by mouth in the morning., Disp: , Rfl:  ?  lisinopril (ZESTRIL) 30 MG tablet, TAKE 1 TABLET (30 MG TOTAL) BY MOUTH DAILY. FOR BLOOD PRESSURE, Disp: 90 tablet, Rfl: 2 ?  metFORMIN (GLUCOPHAGE-XR) 500 MG 24 hr tablet, TAKE 2 TABLETS (1,000 MG TOTAL) BY MOUTH DAILY WITH BREAKFAST. FOR DIABETES., Disp: 180 tablet, Rfl: 3 ?  metroNIDAZOLE (METROCREAM) 0.75 % cream, Apply 1 application  topically daily as needed (rosacea)., Disp: , Rfl:  ?  Multiple Minerals-Vitamins (CALCIUM-MAGNESIUM-ZINC-D3) TABS, Take 2 tablets by mouth daily., Disp: , Rfl:  ?  ondansetron (ZOFRAN-ODT) 4 MG disintegrating tablet, Dissolve 1 tablet (4 mg total) by mouth every 6 (six) hours as needed for nausea or vomiting., Disp: 20 tablet, Rfl: 0 ?  pantoprazole (PROTONIX) 40 MG tablet, Take 1 tablet (40 mg total) by mouth daily., Disp: 90 tablet, Rfl: 0 ?  traZODone (DESYREL) 50 MG tablet, Take 50 mg by mouth at bedtime as needed for sleep., Disp: , Rfl:  ?  Vitamin D, Ergocalciferol, (DRISDOL) 1.25 MG (50000 UNIT) CAPS capsule, TAKE 1 CAPSULE BY MOUTH ONCE WEEKLY FOR VITAMIN D., Disp: 12 capsule, Rfl: 1 ? ?Past Medical History: ?Past Medical History:  ?Diagnosis Date  ? Anxiety   ? Arthritis   ? Back pain   ? Brain fog   ? CHF (congestive heart failure) (HCC)   ? pt not aware of this  ? Depression   ? Diabetes mellitus without complication (HCC)   ? Difficulty walking   ? Dysmetabolic syndrome X   ? Fatigue   ? Fibromyalgia   ? neuropathy  ? Hypertension   ? Hypertriglyceridemia   ? Joint pain   ? Leg weakness   ? Lower extremity edema   ? Muscle atrophy   ?  Neuropathy   ? Obesity   ? Osteoarthritis   ? PONV (postoperative nausea and vomiting)   ? Poor memory   ? Post-menopausal bleeding   ? Prediabetes   ? Rosacea   ? Sleep apnea   ? not on cpap yet  ? Tetanus vaccine causing adverse effect in therapeutic use   ? Vitamin D deficiency   ? ? ?Tobacco Use: ?Social History  ? ?Tobacco Use  ?Smoking Status Never  ?Smokeless Tobacco Never  ? ? ?Labs: ?Review Flowsheet   ? ?  ?  Latest Ref Rng & Units 02/08/2020 06/01/2020 10/04/2020 03/20/2021  ?[facility] Cardiac and Pulmonary Rehab  ?Cholestrol 0 - 200 mg/dL  [id number]      ?LDL (calc) 0 - 99 mg/dL  49      ?HDL-C >39.00 mg/dL  [phone]      ?Trlycerides 0.0 - 149.0 mg/dL  [phone]      ?Hemoglobin A1c 4.6 - 6.5 % 7.3   7.0   8.6   7.9    ? ?  06/20/2021  ?L[facility] Cardiac and Pulmonary  Rehab  ?Cholestrol 166    ?LDL (calc) 92    ?HDL-C 40.10    ?Trlycerides 169.0    ?Hemoglobin A1c 7.0    ?  ?  ?  ? ? ? ?Pulmonary Assessment Scores: ? Pulmonary Assessment Scores   ? ? Row Name 08/17/21 1703  ?  ?  ?  ? ADL UCSD  ? ADL Phase Entry    ? SOB Score total 85    ? Rest 0    ? Walk 3    ? Stairs 5    ? Bath 3    ? Dress 2    ? Shop 5    ?  ? CAT Score  ? CAT Score 13    ?  ? mMRC Score  ? mMRC Score 2    ? ?  ?  ? ?  ?  ?UCSD: ?Self-administered rating of dyspnea associated with activities of daily living (ADLs) ?6-point scale (0 = "not at all" to 5 = "maximal or unable to do because of breathlessness")  ?Scoring Scores range from 0 to 120.  Minimally important difference is 5 units ? ?CAT: ?CAT can identify the health impairment of COPD patients and is better correlated with disease progression.  ?CAT has a scoring range of zero to 40. The CAT score is classified into four groups of low (less than 10), medium (10 - 20), high (21-30) and very high (31-40) based on the impact level of disease on health status. A CAT score over 10 suggests significant symptoms.  A worsening CAT score could be explained by an exacerbation, poor medication adherence, poor inhaler technique, or progression of COPD or comorbid conditions.  ?CAT MCID is 2 points ? ?mMRC: ?mMRC (Modified Medical Research Council) Dyspnea Scale is used to assess the degree of baseline functional disability in patients of respiratory disease due to dyspnea. ?No minimal important difference is established. A decrease in score of 1 point or greater is considered a positive change.  ? ?Pulmonary Function Assessment: ? Pulmonary Function Assessment - 08/14/21 1123   ? ?  ? Breath  ? Shortness of Breath Limiting activity;Yes   ? ?  ?  ? ?  ? ? ?Exercise Target Goals: ?Exercise Program Goal: ?Individual exercise prescription set using results from initial 6 min walk test and THRR while considering  patient?s activity barriers and safety.  ? ?  Exercise  Prescription Goal: ?Initial exercise prescription builds to 30-45 minutes a day of aerobic activity, 2-3 days per week.  Home exercise guidelines will be given to patient during program as part of exercise prescription that the participant will acknowledge. ? ?Education: Aerobic Exercise: ?- Group verbal and visual presentation on the components of exercise prescription. Introduces F.I.T.T principle from ACSM for exercise prescriptions.  Reviews F.I.T.T. principles of aerobic exercise including progression. Written material given at graduation. ?Flowsheet Row Pulmonary Rehab from 08/17/2021 in [facility] Cardiac and Pulmonary Rehab  ?Education need identified 08/17/21  ? ?  ? ? ?Education: Resistance Exercise: ?- Group verbal and visual presentation on the components of exercise prescription. Introduces F.I.T.T principle from ACSM for exercise prescriptions  Reviews F.I.T.T. principles of resistance exercise including progression. Written material given at graduation. ? ?  ?Education: Exercise & Equipment Safety: ?- Individual verbal instruction and demonstration of equipment use and safety with use of the equipment. ?Flowsheet Row Pulmonary Rehab from 08/14/2021 in [facility] Cardiac and Pulmonary Rehab  ?Date 08/14/21  ?Educator [facility]  ?Instruction Review Code 1- Verbalizes Understanding  ? ?  ? ? ?Education: Exercise Physiology & General Exercise Guidelines: ?- Group verbal and written instruction with models to review the exercise physiology of the cardiovascular system and associated critical values. Provides general exercise guidelines with specific guidelines to those with heart or lung disease.  ?Flowsheet Row Pulmonary Rehab from 08/17/2021 in [facility] Cardiac and Pulmonary Rehab  ?Education need identified 08/17/21  ? ?  ? ? ?Education: Flexibility, Balance, Mind/Body Relaxation: ?- Group verbal and visual presentation with interactive activity on the components of exercise prescription. Introduces F.I.T.T principle from ACSM  for exercise prescriptions. Reviews F.I.T.T. principles of flexibility and balance exercise training including progression. Also discusses the mind body connection.  Reviews various relaxation techniques to h

## 2021-09-14 ENCOUNTER — Ambulatory Visit (INDEPENDENT_AMBULATORY_CARE_PROVIDER_SITE_OTHER): Payer: BC Managed Care – PPO | Admitting: Psychology

## 2021-09-14 DIAGNOSIS — F339 Major depressive disorder, recurrent, unspecified: Secondary | ICD-10-CM

## 2021-09-14 NOTE — Progress Notes (Signed)
[place] Counselor/Therapist Progress Note  Patient ID: Brandy [name], MRN: [id number]    Date: 09/14/21  Time Spent: 12:35 pm 1:30 pm:  55 Minutes  Treatment Type: Individual Therapy.  Reported Symptoms: Anxiety   Mental Status Exam: Appearance:  Fairly Groomed     Behavior: Appropriate  Motor: Normal  Speech/Language:  Clear and Coherent and Normal Rate  Affect: Appropriate  Mood: normal  Thought process: normal  Thought content:   WNL  Sensory/Perceptual disturbances:   WNL  Orientation: oriented to person, place, time/date, and situation  Attention: Good  Concentration: Good  Memory: WNL  Fund of knowledge:  Good  Insight:   Good  Judgment:  Good  Impulse Control: Good   Risk Assessment: Danger to Self:  No Self-injurious Behavior: No Danger to Others: No Duty to Warn:no Physical Aggression / Violence:No  Access to Firearms a concern: No  Gang Involvement:No   Subjective:   Brandy [name] participated car, via video, and consented to treatment. Therapist participated from home office. We met online due to [place] pandemic. Brandy [name] reviewed the events of the past week. Brandy [name] noted interpersonal stressors with her daughter in regards to an unpaid loan. She noted a lack of communication and commitment towards repayment. We explored ways to set boundaries and communicate them consistently, positively, and assertively. Therapist modeled this during the session. Brandy [name] noted often feeling like the "bad" guy and her husband, Brandy [name], being conflict averse. We worked on feeling identification during the session. She noted her daughter's likelihood of having unaddressed/under-addressed mental health concerns that contributes to the familial dynamics. We will continue to explore this during follow-ups. Therapist provided supportive therapy and validated Brandy [name]'s feelings and experience.  Interventions: interpersonal stressors and assertive  communication.  Interventions: Interpersonal.  Diagnosis: Episode of recurrent major depressive disorder, unspecified depression episode severity ([place])  Treatment Plan:  [name], forthcoming, self-aware, and motivated for change.   Support System: Friend Brandy [name]) and Family.   Client Treatment Preferences Outpatient Therapy.  Client Statement of Needs [name]'s goals for treatment managing her mood and symptoms, processing past events, focus on self-care, set boundaries for self and others, decrease negative self-talk, decrease rumination, process relationship stressors, and improve health.   Treatment Level Weekly  Symptoms  Depressive symptoms: Decreased interest, feeling down, poor sleep, lethargy, psycho-motor retardation, feeling bad about self.  (Status: maintained)  Anxiety: Difficulty managing worry, worrying about different things, restlessness, irritability, afraid something awful might happen  (Status: maintained)  Goals:  [name] experiences symptoms of depression and anxiety.   Target Date: 2022-06-08 Frequency: Weekly  Progress: 10 Modality: individual    Therapist will provide referrals for additional resources as appropriate.  Therapist will provide psycho-education regarding Brandy [name]'s diagnosis and corresponding treatment approaches and interventions. Licensed Clinical Social Worker, [place], LCSW will support the patient's ability to achieve the goals identified. will employ CBT, BA, Problem-solving, Solution Focused, Mindfulness,  coping skills, & other evidenced-based practices will be used to promote progress towards healthy functioning to help manage decrease symptoms associated with her diagnosis.   Reduce overall level, frequency, and intensity of the feelings of depression and anxiety as evidenced by decreased overall symptoms of depression and anxiety from 6 to 7 days/week to 0 to 1 days/week per client report for at  least 3 consecutive months. Verbally express understanding of the relationship between feelings of depression, anxiety and their impact on thinking patterns and behaviors. Verbalize an understanding of the role that  distorted thinking plays in creating fears, excessive worry, and ruminations.  Brandy [name] participated in the creation of the treatment plan)    Brandy Irish, LCSW

## 2021-09-18 ENCOUNTER — Encounter: Payer: BC Managed Care – PPO | Admitting: *Deleted

## 2021-09-18 DIAGNOSIS — R0600 Dyspnea, unspecified: Secondary | ICD-10-CM | POA: Diagnosis not present

## 2021-09-18 DIAGNOSIS — E1159 Type 2 diabetes mellitus with other circulatory complications: Secondary | ICD-10-CM | POA: Diagnosis not present

## 2021-09-18 DIAGNOSIS — R0602 Shortness of breath: Secondary | ICD-10-CM | POA: Diagnosis not present

## 2021-09-18 DIAGNOSIS — I5032 Chronic diastolic (congestive) heart failure: Secondary | ICD-10-CM | POA: Diagnosis not present

## 2021-09-18 DIAGNOSIS — R0609 Other forms of dyspnea: Secondary | ICD-10-CM | POA: Diagnosis not present

## 2021-09-18 NOTE — Progress Notes (Signed)
Daily Session Note  Patient Details  Name: Brandy [name] MRN: [id number] Date of Birth: [birth year] Referring Provider:   Flowsheet Row Pulmonary Rehab from 08/17/2021 in [facility] Cardiac and Pulmonary Rehab  Referring Provider Ida Rogue MD       Encounter Date: 09/18/2021  Check In:  Session Check In - 09/18/21 1138       Check-In   Supervising physician immediately available to respond to emergencies See telemetry face sheet for immediately available ER MD    Location [facility]-Cardiac & Pulmonary Rehab    Staff Present Heath Lark, RN, BSN, CCRP;Kelly Bollinger, MPA, [place]icia Area, BS, ACSM CEP, Exercise Physiologist    Virtual Visit No    Medication changes reported     No    Fall or balance concerns reported    No    Warm-up and Cool-down Performed on first and last piece of equipment    Resistance Training Performed Yes    VAD Patient? No    PAD/SET Patient? No      Pain Assessment   Currently in Pain? No/denies                Social History   Tobacco Use  Smoking Status Never  Smokeless Tobacco Never    Goals Met:  Proper associated with RPD/PD & O2 Sat Independence with exercise equipment Exercise tolerated well No report of concerns or symptoms today  Goals Unmet:  Not Applicable  Comments: Pt able to follow exercise prescription today without complaint.  Will continue to monitor for progression.    Dr. Emily Filbert is Medical Director for [place].  Dr. Ottie Glazier is Medical Director for [facility] Pulmonary Rehabilitation.

## 2021-09-20 ENCOUNTER — Other Ambulatory Visit: Payer: Self-pay | Admitting: General Surgery

## 2021-09-20 DIAGNOSIS — I5032 Chronic diastolic (congestive) heart failure: Secondary | ICD-10-CM | POA: Diagnosis not present

## 2021-09-20 DIAGNOSIS — R0609 Other forms of dyspnea: Secondary | ICD-10-CM | POA: Diagnosis not present

## 2021-09-20 DIAGNOSIS — R0600 Dyspnea, unspecified: Secondary | ICD-10-CM | POA: Diagnosis not present

## 2021-09-20 DIAGNOSIS — E1159 Type 2 diabetes mellitus with other circulatory complications: Secondary | ICD-10-CM | POA: Diagnosis not present

## 2021-09-20 DIAGNOSIS — E119 Type 2 diabetes mellitus without complications: Secondary | ICD-10-CM

## 2021-09-20 DIAGNOSIS — R0602 Shortness of breath: Secondary | ICD-10-CM | POA: Diagnosis not present

## 2021-09-20 DIAGNOSIS — Z9884 Bariatric surgery status: Secondary | ICD-10-CM

## 2021-09-20 NOTE — Progress Notes (Signed)
Daily Session Note  Patient Details  Name: Brandy [name] MRN: [id number] Date of Birth: [birth year] Referring Provider:   Flowsheet Row Pulmonary Rehab from 08/17/2021 in [facility] Cardiac and Pulmonary Rehab  Referring Provider Ida Rogue MD       Encounter Date: 09/20/2021  Check In:  Session Check In - 09/20/21 1101       Check-In   Supervising physician immediately available to respond to emergencies See telemetry face sheet for immediately available ER MD    Location [facility]-Cardiac & Pulmonary Rehab    Staff Present Birdie Sons, MPA, RN;Jessica [place], MA, RCEP, CCRP, CCET;Joseph Stanton, [place];Heath Lark, RN, BSN, CCRP    Virtual Visit No    Medication changes reported     No    Fall or balance concerns reported    No    Warm-up and Cool-down Performed on first and last piece of equipment    Resistance Training Performed Yes    VAD Patient? No    PAD/SET Patient? No      Pain Assessment   Currently in Pain? No/denies                Social History   Tobacco Use  Smoking Status Never  Smokeless Tobacco Never    Goals Met:  Independence with exercise equipment Exercise tolerated well No report of concerns or symptoms today Strength training completed today  Goals Unmet:  Not Applicable  Comments: Pt able to follow exercise prescription today without complaint.  Will continue to monitor for progression.    Dr. Emily Filbert is Medical Director for [place].  Dr. Ottie Glazier is Medical Director for [facility] Pulmonary Rehabilitation.

## 2021-09-21 ENCOUNTER — Ambulatory Visit (INDEPENDENT_AMBULATORY_CARE_PROVIDER_SITE_OTHER): Payer: BC Managed Care – PPO | Admitting: Psychology

## 2021-09-21 DIAGNOSIS — F339 Major depressive disorder, recurrent, unspecified: Secondary | ICD-10-CM | POA: Diagnosis not present

## 2021-09-21 NOTE — Progress Notes (Signed)
[place] Counselor/Therapist Progress Note  Patient ID: Brandy [name], MRN: [id number]    Date: 09/21/21  Time Spent: 12:34 pm  1:27 pm:  53 Minutes  Treatment Type: Individual Therapy.  Reported Symptoms: Anxiety   Mental Status Exam: Appearance:  Fairly Groomed     Behavior: Appropriate  Motor: Normal  Speech/Language:  Clear and Coherent and Normal Rate  Affect: Appropriate  Mood: normal  Thought process: normal  Thought content:   WNL  Sensory/Perceptual disturbances:   WNL  Orientation: oriented to person, place, time/date, and situation  Attention: Good  Concentration: Good  Memory: WNL  Fund of knowledge:  Good  Insight:   Good  Judgment:  Good  Impulse Control: Good   Risk Assessment: Danger to Self:  No Self-injurious Behavior: No Danger to Others: No Duty to Warn:no Physical Aggression / Violence:No  Access to Firearms a concern: No  Gang Involvement:No   Subjective:   Brandy [name] participated car, via video, and consented to treatment. Therapist participated from home office. We met online due to Brandy [name] pandemic. Brandy [name] reviewed the events of the past week. She noted having a boring life. She noted a need for more excitement in her life and noted barriers to this including scheduling, finances, and her son, Brandy [name]. We worked on identifying ways to inject enjoyable activities in her day-to-day life. We worked on identifying ways to address barriers via problem solving, including acclimating Brandy [name] to change and taking him into the community, spending time with Brandy [name], and scheduling time for self. Brandy [name] noted reengaging with a friend and found this to be positive and enjoyable. Therapist praised Brandy [name] for her effort in this area and for adhering with her bariatric post-op diet day-to-day and when eating out with friends. Brandy [name] noted continued stress with her daughter and a need to resolve the strain. We will continue to explore this during  follow-ups. Therapist provided supportive therapy and validated Brandy [name]'s feelings and experience.  Interventions: interpersonal stressors and assertive communication.  Interventions: Interpersonal and problem-solving.  Diagnosis: Episode of recurrent major depressive disorder, unspecified depression episode severity (Brandy [name])  Treatment Plan:  [name], forthcoming, self-aware, and motivated for change.   Support System: Friend Brandy [name]) and Family.   Client Treatment Preferences Outpatient Therapy.  Client Statement of Needs Brandy [name]'s goals for treatment managing her mood and symptoms, processing past events, focus on self-care, set boundaries for self and others, decrease negative self-talk, decrease rumination, process relationship stressors, and improve health.   Treatment Level Weekly  Symptoms  Depressive symptoms: Decreased interest, feeling down, poor sleep, lethargy, psycho-motor retardation, feeling bad about self.  (Status: maintained)  Anxiety: Difficulty managing worry, worrying about different things, restlessness, irritability, afraid something awful might happen  (Status: maintained)  Goals:  Brandy [name] experiences symptoms of depression and anxiety.   Target Date: 2022-06-08 Frequency: Weekly  Progress: 10 Modality: individual    Therapist will provide referrals for additional resources as appropriate.  Therapist will provide psycho-education regarding Brandy [name]'s diagnosis and corresponding treatment approaches and interventions. Licensed Clinical Social Worker, [place], LCSW will support the patient's ability to achieve the goals identified. will employ CBT, BA, Problem-solving, Solution Focused, Mindfulness,  coping skills, & other evidenced-based practices will be used to promote progress towards healthy functioning to help manage decrease symptoms associated with her diagnosis.   Reduce overall level, frequency, and intensity of the  feelings of depression and anxiety as evidenced by decreased overall symptoms of depression and anxiety  from 6 to 7 days/week to 0 to 1 days/week per client report for at least 3 consecutive months. Verbally express understanding of the relationship between feelings of depression, anxiety and their impact on thinking patterns and behaviors. Verbalize an understanding of the role that distorted thinking plays in creating fears, excessive worry, and ruminations.  Brandy [name] participated in the creation of the treatment plan)    [name], LCSW

## 2021-09-22 ENCOUNTER — Encounter: Payer: BC Managed Care – PPO | Admitting: *Deleted

## 2021-09-22 DIAGNOSIS — E1159 Type 2 diabetes mellitus with other circulatory complications: Secondary | ICD-10-CM | POA: Diagnosis not present

## 2021-09-22 DIAGNOSIS — R0609 Other forms of dyspnea: Secondary | ICD-10-CM | POA: Diagnosis not present

## 2021-09-22 DIAGNOSIS — R0600 Dyspnea, unspecified: Secondary | ICD-10-CM | POA: Diagnosis not present

## 2021-09-22 DIAGNOSIS — R0602 Shortness of breath: Secondary | ICD-10-CM | POA: Diagnosis not present

## 2021-09-22 DIAGNOSIS — I5032 Chronic diastolic (congestive) heart failure: Secondary | ICD-10-CM | POA: Diagnosis not present

## 2021-09-22 NOTE — Progress Notes (Signed)
Daily Session Note  Patient Details  Name: Brandy [name] MRN: [id number] Date of Birth: Aug 26, 1965 Referring Provider:   Flowsheet Row Pulmonary Rehab from 08/17/2021 in [facility] Cardiac and Pulmonary Rehab  Referring Provider Ida Rogue MD       Encounter Date: 09/22/2021  Check In:  Session Check In - 09/22/21 1122       Check-In   Supervising physician immediately available to respond to emergencies See telemetry face sheet for immediately available ER MD    Location [facility]-Cardiac & Pulmonary Rehab    Staff Present Heath Lark, RN, BSN, CCRP;Joseph [place], RCP,RRT,BSRT;Jessica Amaya, [place], [place], CCRP, CCET    Virtual Visit No    Medication changes reported     No    Fall or balance concerns reported    No    Warm-up and Cool-down Performed on first and last piece of equipment    Resistance Training Performed Yes    VAD Patient? No    PAD/SET Patient? No      Pain Assessment   Currently in Pain? No/denies                Social History   Tobacco Use  Smoking Status Never  Smokeless Tobacco Never    Goals Met:  Independence with exercise equipment Exercise tolerated well No report of concerns or symptoms today  Goals Unmet:  Not Applicable  Comments: Pt able to follow exercise prescription today without complaint.  Will continue to monitor for progression.    Dr. Emily Filbert is Medical Director for [place].  Dr. Ottie Glazier is Medical Director for [facility] Pulmonary Rehabilitation.

## 2021-09-28 ENCOUNTER — Ambulatory Visit (INDEPENDENT_AMBULATORY_CARE_PROVIDER_SITE_OTHER): Payer: BC Managed Care – PPO | Admitting: Psychology

## 2021-09-28 DIAGNOSIS — F339 Major depressive disorder, recurrent, unspecified: Secondary | ICD-10-CM | POA: Diagnosis not present

## 2021-09-28 NOTE — Progress Notes (Signed)
[place] Counselor/Therapist Progress Note  Patient ID: Brandy [name], MRN: [id number]    Date: 09/28/21  Time Spent: 12:35 pm - 1:22  pm:  47 Minutes  Treatment Type: Individual Therapy.  Reported Symptoms: Anxiety   Mental Status Exam: Appearance:  Fairly Groomed     Behavior: Appropriate  Motor: Normal  Speech/Language:  Clear and Coherent and Normal Rate  Affect: Appropriate  Mood: normal  Thought process: normal  Thought content:   WNL  Sensory/Perceptual disturbances:   WNL  Orientation: oriented to person, place, time/date, and situation  Attention: Good  Concentration: Good  Memory: WNL  Fund of knowledge:  Good  Insight:   Good  Judgment:  Good  Impulse Control: Good   Risk Assessment: Danger to Self:  No Self-injurious Behavior: No Danger to Others: No Duty to Warn:no Physical Aggression / Violence:No  Access to Firearms a concern: No  Gang Involvement:No   Subjective:   Brandy [name] participated car, via phone, and consented to treatment. Therapist participated from home office. We met online due to [place] pandemic. Chelsee Hosie Stocks reviewed the events of the past week.[name] noted her attempts to improve her relationship with her daughter, Brandy [name].  She noted having difficulty having "an adult conversation" with [employer]. She noted her attempts to communicate with her daughter and noted often receiving negative feedback and noted her granddaughter often being in proximity of these interactions. She noted her attempts to set boundaries with her daughter and noted often this going poorly, overall. We explored additional boundaries going forward and ways to communicate assertively. She provided updates regarding her relationship with her father, which is often strained, but noted gaining acceptance regarding the relationship. She noted upcoming follow-ups regarding her bariatric surgery. Therapist provided supportive therapy and validated Tonetta's feelings  and experience. She noted difficulty with managing food cravings and is slated to meet with RD to discuss concerns. We will explore this going forward.    Interventions: interpersonal stressors and assertive communication.  Interventions: Interpersonal and problem-solving.  Diagnosis: Episode of recurrent major depressive disorder, unspecified depression episode severity ([place])  Treatment Plan:  [name], forthcoming, self-aware, and motivated for change.   Support System: Friend [name] and Family.   Client Treatment Preferences Outpatient Therapy.  Client Statement of Needs [name] goals for treatment managing her mood and symptoms, processing past events, focus on self-care, set boundaries for self and others, decrease negative self-talk, decrease rumination, process relationship stressors, and improve health.   Treatment Level Weekly  Symptoms  Depressive symptoms: Decreased interest, feeling down, poor sleep, lethargy, psycho-motor retardation, feeling bad about self.  (Status: maintained)  Anxiety: Difficulty managing worry, worrying about different things, restlessness, irritability, afraid something awful might happen  (Status: maintained)  Goals:  Brandy [name] experiences symptoms of depression and anxiety.   Target Date: 2022-06-08 Frequency: Weekly  Progress: 10 Modality: individual    Therapist will provide referrals for additional resources as appropriate.  Therapist will provide psycho-education regarding Brandy [name]'s diagnosis and corresponding treatment approaches and interventions. Licensed Clinical Social Worker, [place], LCSW will support the patient's ability to achieve the goals identified. will employ CBT, BA, Problem-solving, Solution Focused, Mindfulness,  coping skills, & other evidenced-based practices will be used to promote progress towards healthy functioning to help manage decrease symptoms associated with her  diagnosis.   Reduce overall level, frequency, and intensity of the feelings of depression and anxiety as evidenced by decreased overall symptoms of depression and anxiety from 6  to 7 days/week to 0 to 1 days/week per client report for at least 3 consecutive months. Verbally express understanding of the relationship between feelings of depression, anxiety and their impact on thinking patterns and behaviors. Verbalize an understanding of the role that distorted thinking plays in creating fears, excessive worry, and ruminations.  Brandy [name] participated in the creation of the treatment plan)    [name], LCSW

## 2021-09-29 ENCOUNTER — Encounter: Payer: BC Managed Care – PPO | Admitting: *Deleted

## 2021-09-29 ENCOUNTER — Encounter: Payer: BC Managed Care – PPO | Attending: General Surgery | Admitting: Dietician

## 2021-09-29 VITALS — Ht 66.0 in

## 2021-09-29 DIAGNOSIS — E1165 Type 2 diabetes mellitus with hyperglycemia: Secondary | ICD-10-CM | POA: Diagnosis not present

## 2021-09-29 DIAGNOSIS — Z6841 Body Mass Index (BMI) 40.0 and over, adult: Secondary | ICD-10-CM | POA: Insufficient documentation

## 2021-09-29 DIAGNOSIS — R0609 Other forms of dyspnea: Secondary | ICD-10-CM | POA: Diagnosis not present

## 2021-09-29 DIAGNOSIS — I5032 Chronic diastolic (congestive) heart failure: Secondary | ICD-10-CM | POA: Insufficient documentation

## 2021-09-29 DIAGNOSIS — R0600 Dyspnea, unspecified: Secondary | ICD-10-CM

## 2021-09-29 NOTE — Progress Notes (Signed)
Daily Session Note  Patient Details  Name: Brandy [name] MRN: [id number] Date of Birth: 1965-09-23 Referring Provider:   Flowsheet Row Pulmonary Rehab from 08/17/2021 in [facility] Cardiac and Pulmonary Rehab  Referring Provider Ida Rogue MD       Encounter Date: 09/29/2021  Check In:  Session Check In - 09/29/21 1133       Check-In   Supervising physician immediately available to respond to emergencies See telemetry face sheet for immediately available ER MD    Location [facility]-Cardiac & Pulmonary Rehab    Staff Present Heath Lark, RN, BSN, CCRP;Jessica [place], MA, RCEP, CCRP, CCET;Joseph [place], [place]    Virtual Visit No    Medication changes reported     No    Fall or balance concerns reported    No    Warm-up and Cool-down Performed on first and last piece of equipment    Resistance Training Performed Yes    VAD Patient? No    PAD/SET Patient? No      Pain Assessment   Currently in Pain? No/denies                Social History   Tobacco Use  Smoking Status Never  Smokeless Tobacco Never    Goals Met:  Independence with exercise equipment Exercise tolerated well No report of concerns or symptoms today  Goals Unmet:  Not Applicable  Comments: Pt able to follow exercise prescription today without complaint.  Will continue to monitor for progression.    Dr. Emily Filbert is Medical Director for [place].  Dr. Ottie Glazier is Medical Director for [facility] Pulmonary Rehabilitation.

## 2021-09-29 NOTE — Patient Instructions (Signed)
Use the 5 D's to help control food cravings.  Keep up regular exercise, great job! Continue to emphasize lean proteins and low carb veggies, and keep carb foods to a minimum

## 2021-09-29 NOTE — Progress Notes (Signed)
Nutrition Therapy for Post-Operative Bariatric Diet Follow-up visit:  6 months post-op gastric bypass Surgery  Medical Nutrition Therapy:  Appt start time: 1320 end time:  1420  Anthropometrics:  Date 03/17/21 04/17/21 06/05/21 09/29/21  BMI 56.7 53.7 48.3 42.6  Weight (lbs) 351.2 332.6 299.4 264.0  Skeletal muscle (lbs) 82.5 76.7 72.3 66.8  % body fat 58.2 58.6 56.1 53.3     Clinical: Medications: no changes since previous visit per patient Supplementation: flintstone multivitamin 2x daily; calcium 3x daily Health/ medical history changes: no changes; does have CT scan pending GI symptoms: N/V/D/C: one recent episode of vomiting after eating larger meal with beans; no diarrhea; occasional constipation -- not bad enough to take any medication Dumping Syndrome: no Hair loss: yes, had some loss prior to surgery, now improving  Dietary/ Lifestyle Progress: Upcoming CT scan due to recent history of abdominal pain/ GI distress Some challenge in finding beverages to suit taste, and has been drinking some orange juice diluted with water, tries to get [place] 50 (1/2 sugar).  Using small plates for eating. Feels she does occasionally overeat but is mindful of it. Sight and smell of foods can trigger carving. Currently wants chinese food  Dietary recall: Eating pattern: 4 small meals + 1-2 snacks Dining out: 0-1  1/2 of small meal ie taco Breakfast: most days occ misses -- usually eggs with cheese occ with sausage Snack: few bites of fruit sometimes  Lunch: bell pepper/ cucumber/ tomato + cheese stick +/or beef stick; leftovers; was eating deli [place] and fruit; crock pot chicken with veg/ salad Snack: occ mini meal  Dinner: chicken + veg/ salad + few bites noodles; soup; pork chop; pot roast; tacos with chicken/ gr beef; stuffed shells (did not eat pasta); [place] salad with [place] steak and cheese Snack: occasionally cheese stick; veg; popcorn (first time)  Fluid intake: patient has no  close estimate on fluid amount, but has noticed increase in urination frequency, pale color  Estimated total protein intake: >60g daily Bariatric diet adherence:  Using straws: yes Drinking fluids during meals: rarely forgets to avoid and has a small sip Carbonated beverages: no  Recent physical activity:  pulmonary rehab  3x a week + walking with husband 3-4 x a week   Nutrition Intervention:   Reviewed progress since previous visit  Discussed some options for sugar free beverages Discussed ongoing gradual increase in dietary variety with low fat and low sugar foods, eating protein first, followed by low carb veg, then other foods.  Discussed triggers for and management of food cravings  Nutritional Diagnosis:  [place]-3.3 Overweight/obesity As related to history of excess calories, inadequate physical activity.  As evidenced by patient with current BMI of 48.32, following bariatric diet guidelines for ongoing weight loss after gastric bypass surgery.  Teaching Method Utilized:  Visual Auditory Hands on  Materials provided: Tips for Managing Food Cravings Behavior Chains (AND)  Learning Readiness:  Change in progress  Barriers to learning/adherence to lifestyle change: none  Demonstrated degree of understanding via:  Teach Back      Plan: Work on established nutrition goals Return for 9 month post-op MNT on 01/05/22 at 1:15pm

## 2021-10-02 ENCOUNTER — Encounter: Payer: BC Managed Care – PPO | Admitting: *Deleted

## 2021-10-02 DIAGNOSIS — Z6841 Body Mass Index (BMI) 40.0 and over, adult: Secondary | ICD-10-CM | POA: Diagnosis not present

## 2021-10-02 DIAGNOSIS — R0600 Dyspnea, unspecified: Secondary | ICD-10-CM | POA: Diagnosis not present

## 2021-10-02 DIAGNOSIS — I5032 Chronic diastolic (congestive) heart failure: Secondary | ICD-10-CM | POA: Diagnosis not present

## 2021-10-02 DIAGNOSIS — R0609 Other forms of dyspnea: Secondary | ICD-10-CM | POA: Diagnosis not present

## 2021-10-02 DIAGNOSIS — E1165 Type 2 diabetes mellitus with hyperglycemia: Secondary | ICD-10-CM | POA: Diagnosis not present

## 2021-10-02 NOTE — Progress Notes (Signed)
Daily Session Note  Patient Details  Name: Brandy [name] MRN: [id number] Date of Birth: 01-14-1966 Referring Provider:   Flowsheet Row Pulmonary Rehab from 08/17/2021 in [facility] Cardiac and Pulmonary Rehab  Referring Provider Ida Rogue MD       Encounter Date: 10/02/2021  Check In:  Session Check In - 10/02/21 1133       Check-In   Supervising physician immediately available to respond to emergencies See telemetry face sheet for immediately available ER MD    Location [facility]-Cardiac & Pulmonary Rehab    Staff Present Heath Lark, RN, BSN, CCRP;Kelly [place], MPA, [place], BS, ACSM CEP, Exercise Physiologist;Kara Eliezer Bottom, MS, ASCM CEP, Exercise Physiologist    Virtual Visit No    Medication changes reported     No    Fall or balance concerns reported    No    Warm-up and Cool-down Performed on first and last piece of equipment    Resistance Training Performed Yes    VAD Patient? No    PAD/SET Patient? No      Pain Assessment   Currently in Pain? No/denies                Social History   Tobacco Use  Smoking Status Never  Smokeless Tobacco Never    Goals Met:  Proper associated with RPD/PD & O2 Sat Independence with exercise equipment Exercise tolerated well No report of concerns or symptoms today  Goals Unmet:  Not Applicable  Comments: Pt able to follow exercise prescription today without complaint.  Will continue to monitor for progression.    Dr. Emily Filbert is Medical Director for [facility].  Dr. Ottie Glazier is Medical Director for [facility] Pulmonary Rehabilitation.

## 2021-10-03 ENCOUNTER — Ambulatory Visit
Admission: RE | Admit: 2021-10-03 | Discharge: 2021-10-03 | Disposition: A | Discharge status: Home / Self Care | Payer: Medicare Other | Source: Ambulatory Visit | Attending: General Surgery | Admitting: General Surgery

## 2021-10-03 DIAGNOSIS — K828 Other specified diseases of gallbladder: Secondary | ICD-10-CM | POA: Diagnosis not present

## 2021-10-03 DIAGNOSIS — R160 Hepatomegaly, not elsewhere classified: Secondary | ICD-10-CM | POA: Diagnosis not present

## 2021-10-03 DIAGNOSIS — E119 Type 2 diabetes mellitus without complications: Secondary | ICD-10-CM

## 2021-10-03 DIAGNOSIS — K5730 Diverticulosis of large intestine without perforation or abscess without bleeding: Secondary | ICD-10-CM | POA: Diagnosis not present

## 2021-10-03 DIAGNOSIS — Z9884 Bariatric surgery status: Secondary | ICD-10-CM

## 2021-10-03 DIAGNOSIS — R110 Nausea: Secondary | ICD-10-CM | POA: Diagnosis not present

## 2021-10-03 MED ORDER — IOPAMIDOL (ISOVUE-300) INJECTION 61%
100.0000 mL | Freq: Once | INTRAVENOUS | Status: AC | PRN
  Administered 2021-10-03: 100 mL via INTRAVENOUS

## 2021-10-05 ENCOUNTER — Ambulatory Visit: Payer: BC Managed Care – PPO | Admitting: Psychology

## 2021-10-05 ENCOUNTER — Ambulatory Visit: Payer: BC Managed Care – PPO | Admitting: Primary Care

## 2021-10-09 ENCOUNTER — Encounter: Payer: BC Managed Care – PPO | Admitting: *Deleted

## 2021-10-09 DIAGNOSIS — Z6841 Body Mass Index (BMI) 40.0 and over, adult: Secondary | ICD-10-CM | POA: Diagnosis not present

## 2021-10-09 DIAGNOSIS — I5032 Chronic diastolic (congestive) heart failure: Secondary | ICD-10-CM | POA: Diagnosis not present

## 2021-10-09 DIAGNOSIS — R0609 Other forms of dyspnea: Secondary | ICD-10-CM | POA: Diagnosis not present

## 2021-10-09 DIAGNOSIS — R0600 Dyspnea, unspecified: Secondary | ICD-10-CM

## 2021-10-09 DIAGNOSIS — E1165 Type 2 diabetes mellitus with hyperglycemia: Secondary | ICD-10-CM | POA: Diagnosis not present

## 2021-10-09 NOTE — Progress Notes (Signed)
Daily Session Note  Patient Details  Name: Brandy [name] MRN: [id number] Date of Birth: [birth year] Referring Provider:   Flowsheet Row Pulmonary Rehab from 08/17/2021 in [facility] Cardiac and Pulmonary Rehab  Referring Provider Ida Rogue MD       Encounter Date: 10/09/2021  Check In:  Session Check In - 10/09/21 1122       Check-In   Supervising physician immediately available to respond to emergencies See telemetry face sheet for immediately available ER MD    Location [facility]-Cardiac & Pulmonary Rehab    Staff Present Heath Lark, RN, BSN, [place]a [place], BS, ACSM CEP, Exercise Physiologist;Joseph [place], [place]    Virtual Visit No    Medication changes reported     No    Fall or balance concerns reported    No    Warm-up and Cool-down Performed on first and last piece of equipment    Resistance Training Performed Yes    VAD Patient? No    PAD/SET Patient? No      Pain Assessment   Currently in Pain? No/denies                Social History   Tobacco Use  Smoking Status Never  Smokeless Tobacco Never    Goals Met:  Proper associated with RPD/PD & O2 Sat Independence with exercise equipment Exercise tolerated well No report of concerns or symptoms today  Goals Unmet:  Not Applicable  Comments: Pt able to follow exercise prescription today without complaint.  Will continue to monitor for progression.    Dr. Emily Filbert is Medical Director for [facility].  Dr. Ottie Glazier is Medical Director for [facility] Pulmonary Rehabilitation.

## 2021-10-11 ENCOUNTER — Encounter: Payer: Self-pay | Admitting: *Deleted

## 2021-10-11 DIAGNOSIS — R0600 Dyspnea, unspecified: Secondary | ICD-10-CM

## 2021-10-11 LAB — HM DIABETES EYE EXAM

## 2021-10-11 NOTE — Progress Notes (Signed)
Pulmonary Individual Treatment Plan  Patient Details  Name: Brandy [name] MRN: [id number] Date of Birth: Jul 19, 1965 Referring Provider:   Flowsheet Row Pulmonary Rehab from 08/17/2021 in [facility] Cardiac and Pulmonary Rehab  Referring Provider Ida Rogue MD       Initial Encounter Date:  Flowsheet Row Pulmonary Rehab from 08/17/2021 in [facility] Cardiac and Pulmonary Rehab  Date 08/17/21       Visit Diagnosis: Dyspnea, unspecified type  Patient's Home Medications on Admission:  Current Outpatient Medications:    acetaminophen (TYLENOL) 650 MG CR tablet, Take 1,300 mg by mouth every 8 (eight) hours as needed for pain., Disp: , Rfl:    atorvastatin (LIPITOR) 10 MG tablet, TAKE 1 TABLET BY MOUTH EVERY DAY FOR CHOLESTEROL, Disp: 90 tablet, Rfl: 2   cetirizine (ZYRTEC) 10 MG tablet, TAKE 1 TABLET (10 MG TOTAL) BY MOUTH DAILY. FOR ALLERGIES, Disp: 90 tablet, Rfl: 1   citalopram (CELEXA) 40 MG tablet, Take 40 mg by mouth daily., Disp: , Rfl:    DULoxetine (CYMBALTA) 30 MG capsule, Take 30 mg by mouth daily. Take with 60 mg to equal 90 mg daily, Disp: , Rfl:    DULoxetine (CYMBALTA) 60 MG capsule, Take 60 mg by mouth daily. Take with 30 mg to equal 90 mg daily, Disp: , Rfl:    folic acid (FOLVITE) 1 MG tablet, Take 1 mg by mouth daily., Disp: , Rfl:    gabapentin (NEURONTIN) 300 MG capsule, TAKE 1 CAPSULE BY MOUTH 3 TIMES A DAY AS NEEDED FOR BACK PAIN, Disp: 270 capsule, Rfl: 3   lamoTRIgine (LAMICTAL) 100 MG tablet, Take 100 mg by mouth every evening., Disp: , Rfl:    lamoTRIgine (LAMICTAL) 200 MG tablet, Take 200 mg by mouth in the morning., Disp: , Rfl:    lisinopril (ZESTRIL) 30 MG tablet, TAKE 1 TABLET (30 MG TOTAL) BY MOUTH DAILY. FOR BLOOD PRESSURE, Disp: 90 tablet, Rfl: 2   metFORMIN (GLUCOPHAGE-XR) 500 MG 24 hr tablet, TAKE 2 TABLETS (1,000 MG TOTAL) BY MOUTH DAILY WITH BREAKFAST. FOR DIABETES., Disp: 180 tablet, Rfl: 3   metroNIDAZOLE (METROCREAM) 0.75 % cream, Apply 1 application  topically daily as needed (rosacea)., Disp: , Rfl:    Multiple Minerals-Vitamins (CALCIUM-MAGNESIUM-ZINC-D3) TABS, Take 2 tablets by mouth daily., Disp: , Rfl:    ondansetron (ZOFRAN-ODT) 4 MG disintegrating tablet, Dissolve 1 tablet (4 mg total) by mouth every 6 (six) hours as needed for nausea or vomiting., Disp: 20 tablet, Rfl: 0   pantoprazole (PROTONIX) 40 MG tablet, Take 1 tablet (40 mg total) by mouth daily., Disp: 90 tablet, Rfl: 0   sucralfate (CARAFATE) 1 g tablet, Take by mouth., Disp: , Rfl:    traZODone (DESYREL) 50 MG tablet, Take 50 mg by mouth at bedtime as needed for sleep., Disp: , Rfl:    Vitamin D, Ergocalciferol, (DRISDOL) 1.25 MG (50000 UNIT) CAPS capsule, TAKE 1 CAPSULE BY MOUTH ONCE WEEKLY FOR VITAMIN D., Disp: 12 capsule, Rfl: 1  Past Medical History: Past Medical History:  Diagnosis Date   Anxiety    Arthritis    Back pain    Brain fog    CHF (congestive heart failure) (HCC)    pt not aware of this   Depression    Diabetes mellitus without complication (HCC)    Difficulty walking    Dysmetabolic syndrome X    Fatigue    Fibromyalgia    neuropathy   Hypertension    Hypertriglyceridemia    Joint pain  Leg weakness    Lower extremity edema    Muscle atrophy    Neuropathy    Obesity    Osteoarthritis    PONV (postoperative nausea and vomiting)    Poor memory    Post-menopausal bleeding    Prediabetes    Rosacea    Sleep apnea    not on cpap yet   Tetanus vaccine causing adverse effect in therapeutic use    Vitamin D deficiency     Tobacco Use: Social History   Tobacco Use  Smoking Status Never  Smokeless Tobacco Never    Labs: Review Flowsheet  More data exists      Latest Ref Rng & Units 02/08/2020 06/01/2020 10/04/2020 03/20/2021  Labs for ITP Cardiac and Pulmonary Rehab  Cholestrol 0 - 200 mg/dL - 137  - -  LDL (calc) 0 - 99 mg/dL - 49  - -  HDL-C >39.00 mg/dL - 57.30  - -  Trlycerides 0.0 - 149.0 mg/dL - 152.0  - -  Hemoglobin A1c  4.6 - 6.5 % 7.3  7.0  8.6  7.9       06/20/2021  Labs for ITP Cardiac and Pulmonary Rehab  Cholestrol 166   LDL (calc) 92   HDL-C 40.10   Trlycerides 169.0   Hemoglobin A1c 7.0      Pulmonary Assessment Scores:  Pulmonary Assessment Scores     Row Name 08/17/21 1703         ADL UCSD   ADL Phase Entry     SOB Score total 85     Rest 0     Walk 3     Stairs 5     Bath 3     Dress 2     Shop 5       CAT Score   CAT Score 13       mMRC Score   mMRC Score 2              UCSD: Self-administered rating of dyspnea associated with activities of daily living (ADLs) 6-point scale (0 = "not at all" to 5 = "maximal or unable to do because of breathlessness")  Scoring Scores range from 0 to 120.  Minimally important difference is 5 units  CAT: CAT can identify the health impairment of COPD patients and is better correlated with disease progression.  CAT has a scoring range of zero to 40. The CAT score is classified into four groups of low (less than 10), medium (10 - 20), high (21-30) and very high (31-40) based on the impact level of disease on health status. A CAT score over 10 suggests significant symptoms.  A worsening CAT score could be explained by an exacerbation, poor medication adherence, poor inhaler technique, or progression of COPD or comorbid conditions.  CAT MCID is 2 points  mMRC: mMRC (Modified Medical Research Council) Dyspnea Scale is used to assess the degree of baseline functional disability in patients of respiratory disease due to dyspnea. No minimal important difference is established. A decrease in score of 1 point or greater is considered a positive change.   Pulmonary Function Assessment:  Pulmonary Function Assessment - 08/14/21 1123       Breath   Shortness of Breath Limiting activity;Yes             Exercise Target Goals: Exercise Program Goal: Individual exercise prescription set using results from initial 6 min walk test and THRR  while considering  patient's activity barriers and  safety.   Exercise Prescription Goal: Initial exercise prescription builds to 30-45 minutes a day of aerobic activity, 2-3 days per week.  Home exercise guidelines will be given to patient during program as part of exercise prescription that the participant will acknowledge.  Education: Aerobic Exercise: - Group verbal and visual presentation on the components of exercise prescription. Introduces F.I.T.T principle from ACSM for exercise prescriptions.  Reviews F.I.T.T. principles of aerobic exercise including progression. Written material given at graduation. Flowsheet Row Pulmonary Rehab from 08/17/2021 in [facility] Cardiac and Pulmonary Rehab  Education need identified 08/17/21       Education: Resistance Exercise: - Group verbal and visual presentation on the components of exercise prescription. Introduces F.I.T.T principle from ACSM for exercise prescriptions  Reviews F.I.T.T. principles of resistance exercise including progression. Written material given at graduation.    Education: Exercise & Equipment Safety: - Individual verbal instruction and demonstration of equipment use and safety with use of the equipment. Flowsheet Row Pulmonary Rehab from 08/14/2021 in [facility] Cardiac and Pulmonary Rehab  Date 08/14/21  Educator [facility]  Instruction Review Code 1- Verbalizes Understanding       Education: Exercise Physiology & General Exercise Guidelines: - Group verbal and written instruction with models to review the exercise physiology of the cardiovascular system and associated critical values. Provides general exercise guidelines with specific guidelines to those with heart or lung disease.  Flowsheet Row Pulmonary Rehab from 08/17/2021 in [facility] Cardiac and Pulmonary Rehab  Education need identified 08/17/21       Education: Flexibility, Balance, Mind/Body Relaxation: - Group verbal and visual presentation with interactive activity on the  components of exercise prescription. Introduces F.I.T.T principle from ACSM for exercise prescriptions. Reviews F.I.T.T. principles of flexibility and balance exercise training including progression. Also discusses the mind body connection.  Reviews various relaxation techniques to help reduce and manage stress (i.e. Deep breathing, progressive muscle relaxation, and visualization). Balance handout provided to take home. Written material given at graduation.   Activity Barriers & Risk Stratification:  Activity Barriers & Cardiac Risk Stratification - 08/17/21 1717       Activity Barriers & Cardiac Risk Stratification   Activity Barriers Deconditioning;Muscular Weakness;Shortness of Breath;Fibromyalgia;Joint Problems             6 Minute Walk:  6 Minute Walk     Row Name 08/17/21 1715         6 Minute Walk   Phase Initial     Distance 765 feet     Walk Time 6 minutes     # of Rest Breaks 0     MPH 1.44     METS 2.17     RPE 13     Perceived Dyspnea  2     VO2 Peak 7.6     Symptoms Yes (comment)     Comments SOB     Resting HR 65 bpm     Resting BP 140/70     Resting Oxygen Saturation  96 %     Exercise Oxygen Saturation  during 6 min walk 96 %     Max Ex. HR 140 bpm     Max Ex. BP 154/78     2 Minute Post BP 138/78       Interval HR   1 Minute HR 98     2 Minute HR 115     3 Minute HR 129     4 Minute HR 137     5 Minute HR 139  6 Minute HR 140     2 Minute Post HR 75     Interval Heart Rate? Yes       Interval Oxygen   Interval Oxygen? Yes     Baseline Oxygen Saturation % 96 %     1 Minute Oxygen Saturation % 97 %     1 Minute Liters of Oxygen 0 L  RA     2 Minute Oxygen Saturation % 99 %     2 Minute Liters of Oxygen 0 L     3 Minute Oxygen Saturation % 98 %     3 Minute Liters of Oxygen 0 L     4 Minute Oxygen Saturation % 98 %     4 Minute Liters of Oxygen 0 L     5 Minute Oxygen Saturation % 99 %     5 Minute Liters of Oxygen 0 L     6 Minute  Oxygen Saturation % 98 %     6 Minute Liters of Oxygen 0 L     2 Minute Post Oxygen Saturation % 97 %     2 Minute Post Liters of Oxygen 0 L             Oxygen Initial Assessment:  Oxygen Initial Assessment - 08/17/21 1702       Home Oxygen   Home Oxygen Device None    Sleep Oxygen Prescription None    Home Exercise Oxygen Prescription None    Home Resting Oxygen Prescription None    Compliance with Home Oxygen Use Yes      Initial 6 min Walk   Oxygen Used None      Program Oxygen Prescription   Program Oxygen Prescription None      Intervention   Short Term Goals To learn and understand importance of monitoring SPO2 with pulse oximeter and demonstrate accurate use of the pulse oximeter.;To learn and understand importance of maintaining oxygen saturations>88%;To learn and demonstrate proper pursed lip breathing techniques or other breathing techniques.     Long  Term Goals Verbalizes importance of monitoring SPO2 with pulse oximeter and return demonstration;Maintenance of O2 saturations>88%;Exhibits proper breathing techniques, such as pursed lip breathing or other method taught during program session;Compliance with respiratory medication             Oxygen Re-Evaluation:  Oxygen Re-Evaluation     Row Name 08/21/21 1018 10/02/21 1155           Program Oxygen Prescription   Program Oxygen Prescription -- None        Home Oxygen   Home Oxygen Device -- None      Sleep Oxygen Prescription -- None      Home Exercise Oxygen Prescription -- None      Home Resting Oxygen Prescription -- None      Compliance with Home Oxygen Use -- Yes        Goals/Expected Outcomes   Short Term Goals -- To learn and understand importance of monitoring SPO2 with pulse oximeter and demonstrate accurate use of the pulse oximeter.;To learn and understand importance of maintaining oxygen saturations>88%;To learn and demonstrate proper pursed lip breathing techniques or other breathing  techniques.       Long  Term Goals -- Verbalizes importance of monitoring SPO2 with pulse oximeter and return demonstration;Maintenance of O2 saturations>88%;Exhibits proper breathing techniques, such as pursed lip breathing or other method taught during program session;Compliance with respiratory medication      Comments  Reviewed PLB technique with pt.  Talked about how it works and it's importance in maintaining their exercise saturations. Discussed purchasing a pulse ox to monitor her heart rate and oxygen. She is checking her HR via her BP cuff, but not during exercise. She's walking to rehab without any assistance, but wants to work on balance exercises.      Goals/Expected Outcomes Short: Become more profiecient at using PLB.   Long: Become independent at using PLB. ST: purchase a pulse ox. LT: continue to monitor HR and oxygen at home independently               Oxygen Discharge (Final Oxygen Re-Evaluation):  Oxygen Re-Evaluation - 10/02/21 1155       Program Oxygen Prescription   Program Oxygen Prescription None      Home Oxygen   Home Oxygen Device None    Sleep Oxygen Prescription None    Home Exercise Oxygen Prescription None    Home Resting Oxygen Prescription None    Compliance with Home Oxygen Use Yes      Goals/Expected Outcomes   Short Term Goals To learn and understand importance of monitoring SPO2 with pulse oximeter and demonstrate accurate use of the pulse oximeter.;To learn and understand importance of maintaining oxygen saturations>88%;To learn and demonstrate proper pursed lip breathing techniques or other breathing techniques.     Long  Term Goals Verbalizes importance of monitoring SPO2 with pulse oximeter and return demonstration;Maintenance of O2 saturations>88%;Exhibits proper breathing techniques, such as pursed lip breathing or other method taught during program session;Compliance with respiratory medication    Comments Discussed purchasing a pulse ox to  monitor her heart rate and oxygen. She is checking her HR via her BP cuff, but not during exercise. She's walking to rehab without any assistance, but wants to work on balance exercises.    Goals/Expected Outcomes ST: purchase a pulse ox. LT: continue to monitor HR and oxygen at home independently             Initial Exercise Prescription:  Initial Exercise Prescription - 08/17/21 1700       Date of Initial Exercise RX and Referring Provider   Date 08/17/21    Referring Provider Ida Rogue MD      Oxygen   Maintain Oxygen Saturation 88% or higher      NuStep   Level 1    SPM 80    Minutes 15    METs 1.4      REL-XR   Level 1    Speed 50    Minutes 15    METs 1.4      T5 Nustep   Level 1    SPM 80    Minutes 15    METs 1.4      Track   Laps 7    Minutes 15    METs 1.38      Prescription Details   Frequency (times per week) 3    Duration Progress to 30 minutes of continuous aerobic without signs/symptoms of physical distress      Intensity   THRR 40-80% of Max Heartrate 103 - 142    Ratings of Perceived Exertion 11-13    Perceived Dyspnea 0-4      Progression   Progression Continue to progress workloads to maintain intensity without signs/symptoms of physical distress.      Resistance Training   Training Prescription Yes    Weight 4 lb    Reps 10-15  Perform Capillary Blood Glucose checks as needed.  Exercise Prescription Changes:   Exercise Prescription Changes     Row Name 08/17/21 1700 09/05/21 1100 09/21/21 1400 10/03/21 0800       Response to Exercise   Blood Pressure (Admit) 140/70 144/98 130/70 134/64    Blood Pressure (Exercise) 154/78 130/70 -- 142/76    Blood Pressure (Exit) 138/78 126/70 124/78 124/64    Heart Rate (Admit) 65 bpm 76 bpm 99 bpm 96 bpm    Heart Rate (Exercise) 140 bpm 102 bpm 133 bpm 110 bpm    Heart Rate (Exit) 68 bpm 99 bpm 94 bpm 93 bpm    Oxygen Saturation (Admit) 96 % 96 % 96 % 97 %     Oxygen Saturation (Exercise) 98 % 95 % 97 % 93 %    Oxygen Saturation (Exit) 97 % 96 % 96 % 96 %    Rating of Perceived Exertion (Exercise) _0 Perceived Dyspnea (Exercise) _1 --    Symptoms SOB SOB none none    Comments walk test results -- -- --    Duration -- Continue with 30 min of aerobic exercise without signs/symptoms of physical distress. Continue with 30 min of aerobic exercise without signs/symptoms of physical distress. Continue with 30 min of aerobic exercise without signs/symptoms of physical distress.    Intensity -- THRR unchanged THRR unchanged THRR unchanged      Progression   Progression -- Continue to progress workloads to maintain intensity without signs/symptoms of physical distress. Continue to progress workloads to maintain intensity without signs/symptoms of physical distress. Continue to progress workloads to maintain intensity without signs/symptoms of physical distress.    Average METs -- 1.45 1.58 1.54      Resistance Training   Training Prescription -- Yes Yes Yes    Weight -- 4 lb 4 lb 4 lb    Reps -- 10-15 10-15 10-15      Interval Training   Interval Training -- No No No      Treadmill   MPH -- -- 1.1 1.2    Grade -- -- 0 0    Minutes -- -- 15 15    METs -- -- 1.8 1.92      Arm Ergometer   Level -- 1 -- --    Minutes -- 15 -- --    METs -- 1.7 -- --      REL-XR   Level -- _2 Minutes -- _3 METs -- 1.2 1.2 1.1      T5 Nustep   Level -- -- 1 --    Minutes -- -- 15 --    METs -- -- 1.5 --      Track   Laps -- -- 15 11    Minutes -- -- 15 15    METs -- -- 1.76 1.6      Oxygen   Maintain Oxygen Saturation -- 88% or higher 88% or higher 88% or higher             Exercise Comments:   Exercise Goals and Review:   Exercise Goals     Row Name 08/17/21 1719             Exercise Goals   Increase Physical Activity Yes       Intervention Provide advice, education, support and counseling about physical  activity/exercise needs.;Develop an individualized exercise prescription for  aerobic and resistive training based on initial evaluation findings, risk stratification, comorbidities and participant's personal goals.       Expected Outcomes Short Term: Attend rehab on a regular basis to increase amount of physical activity.;Long Term: Add in home exercise to make exercise part of routine and to increase amount of physical activity.;Long Term: Exercising regularly at least 3-5 days a week.       Increase Strength and Stamina Yes       Intervention Provide advice, education, support and counseling about physical activity/exercise needs.;Develop an individualized exercise prescription for aerobic and resistive training based on initial evaluation findings, risk stratification, comorbidities and participant's personal goals.       Expected Outcomes Short Term: Increase workloads from initial exercise prescription for resistance, speed, and METs.;Short Term: Perform resistance training exercises routinely during rehab and add in resistance training at home;Long Term: Improve cardiorespiratory fitness, muscular endurance and strength as measured by increased METs and functional capacity (6MWT)       Able to understand and use rate of perceived exertion (RPE) scale Yes       Intervention Provide education and explanation on how to use RPE scale       Expected Outcomes Short Term: Able to use RPE daily in rehab to express subjective intensity level;Long Term:  Able to use RPE to guide intensity level when exercising independently       Able to understand and use Dyspnea scale Yes       Intervention Provide education and explanation on how to use Dyspnea scale       Expected Outcomes Short Term: Able to use Dyspnea scale daily in rehab to express subjective sense of shortness of breath during exertion;Long Term: Able to use Dyspnea scale to guide intensity level when exercising independently       Knowledge and  understanding of Target Heart Rate Range (THRR) Yes       Intervention Provide education and explanation of THRR including how the numbers were predicted and where they are located for reference       Expected Outcomes Short Term: Able to state/look up THRR;Short Term: Able to use daily as guideline for intensity in rehab;Long Term: Able to use THRR to govern intensity when exercising independently       Able to check pulse independently Yes       Intervention Provide education and demonstration on how to check pulse in carotid and radial arteries.;Review the importance of being able to check your own pulse for safety during independent exercise       Expected Outcomes Short Term: Able to explain why pulse checking is important during independent exercise;Long Term: Able to check pulse independently and accurately       Understanding of Exercise Prescription Yes       Intervention Provide education, explanation, and written materials on patient's individual exercise prescription       Expected Outcomes Short Term: Able to explain program exercise prescription;Long Term: Able to explain home exercise prescription to exercise independently                Exercise Goals Re-Evaluation :  Exercise Goals Re-Evaluation     Row Name 08/21/21 1015 09/05/21 1129 09/11/21 1151 09/21/21 1419 10/02/21 1110     Exercise Goal Re-Evaluation   Exercise Goals Review Increase Physical Activity;Able to understand and use rate of perceived exertion (RPE) scale;Knowledge and understanding of Target Heart Rate Range (THRR);Understanding of Exercise Prescription;Increase Strength and Stamina;Able to  check pulse independently;Able to understand and use Dyspnea scale Increase Physical Activity;Increase Strength and Stamina;Understanding of Exercise Prescription Increase Physical Activity;Increase Strength and Stamina;Understanding of Exercise Prescription Increase Physical Activity;Increase Strength and  Stamina;Understanding of Exercise Prescription Increase Physical Activity;Increase Strength and Stamina;Understanding of Exercise Prescription   Comments Reviewed RPE and dyspnea scales, THR and program prescription with pt today.  Pt voiced understanding and was given a copy of goals to take home. Brandy [name] is off to a good start in rehab.  She has completed her first four days of exercise thus far.  She is now going for the 30 minutes.  We will continue to monitor her progress. Brandy [name] is walking at home around a track for ~1 hour with her husband. She has not gone over home exercise with EP. Brandy [name] is doing well in rehab. She increased her laps on the track from 7 to 15 in total. She also moved up to level 3 on the XR. She tried out the treadmill for the first time at 1.1 mph speed which about the same speed as her track. We hope to see that improve over time. Will continue to monitor. Brandy [name], [place], reviewed home exercise with pt today.  Pt is walking 30-45 min at a baseball field and breaking during laps. Pt is walking 5-6 days per week. Pt plans to continue walking at the baseball field for exercise.  Reviewed THR, pulse, RPE, sign and symptoms, pulse oximetery and when to call 911 or MD.  Also discussed weather considerations and indoor options.  Pt voiced understanding. Pt requested explanation of METs and assistance with balance exercise; balance printout and staff youtube video information provided.   Expected Outcomes Short: Use RPE daily to regulate intensity. Long: Follow program prescription in THR. Short: Continue to attend rehab regularly Long: Continue to follow program prescription ST: EP to go over home exercise LT: Continue to follow program prescription Short: Increase speed on treadmill Long: Continue to increase overall MET level Short: minimize breaks during walking exercise Long: continue exercising independently at home    [place] Name 10/03/21 0832             Exercise Goal Re-Evaluation    Exercise Goals Review Increase Physical Activity;Increase Strength and Stamina;Understanding of Exercise Prescription       Comments Brandy [name] is doing well in rehab. She increased her speed on the treadmill to 1.2 mph. She has also tolerated using 4 lb hand weights for resistance training. She could benefit from pushing it to do more laps on the track. We will continue to monitor her progress in the program.       Expected Outcomes Short: Continue to push for more laps on the track. Long: Continue to increase stamina.                Discharge Exercise Prescription (Final Exercise Prescription Changes):  Exercise Prescription Changes - 10/03/21 0800       Response to Exercise   Blood Pressure (Admit) 134/64    Blood Pressure (Exercise) 142/76    Blood Pressure (Exit) 124/64    Heart Rate (Admit) 96 bpm    Heart Rate (Exercise) 110 bpm    Heart Rate (Exit) 93 bpm    Oxygen Saturation (Admit) 97 %    Oxygen Saturation (Exercise) 93 %    Oxygen Saturation (Exit) 96 %    Rating of Perceived Exertion (Exercise) 13    Symptoms none    Duration Continue with 30 min of aerobic exercise  without signs/symptoms of physical distress.    Intensity THRR unchanged      Progression   Progression Continue to progress workloads to maintain intensity without signs/symptoms of physical distress.    Average METs 1.54      Resistance Training   Training Prescription Yes    Weight 4 lb    Reps 10-15      Interval Training   Interval Training No      Treadmill   MPH 1.2    Grade 0    Minutes 15    METs 1.92      REL-XR   Level 3    Minutes 15    METs 1.1      Track   Laps 11    Minutes 15    METs 1.6      Oxygen   Maintain Oxygen Saturation 88% or higher             Nutrition:  Target Goals: Understanding of nutrition guidelines, daily intake of sodium <1539m, cholesterol <2068m calories 30% from fat and 7% or less from saturated fats, daily to have 5 or more servings of  fruits and vegetables.  Education: All About Nutrition: -Group instruction provided by verbal, written material, interactive activities, discussions, models, and posters to present general guidelines for heart healthy nutrition including fat, fiber, MyPlate, the role of sodium in heart healthy nutrition, utilization of the nutrition label, and utilization of this knowledge for meal planning. Follow up email sent as well. Written material given at graduation.   Biometrics:  Pre Biometrics - 08/17/21 1716       Pre Biometrics   Height 5' 7.25" (1.708 m)    Weight 295 lb 11.2 oz (134.1 kg)    BMI (Calculated) 45.98    Single Leg Stand 2.3 seconds              Nutrition Therapy Plan and Nutrition Goals:  Nutrition Therapy & Goals - 09/11/21 1135       Nutrition Therapy   RD appointment deferred Yes   Seeing RD s/p bariatric surgery - she has her 6 month appointment soon. Will defer nutrition until 6 month bariatric surgery RD appointment.     Personal Nutrition Goals   Nutrition Goal Seeing RD s/p bariatric surgery - she has her 6 month appointment soon. Will defer nutrition until 6 month bariatric surgery RD appointment.             Nutrition Assessments:  MEDIFICTS Score Key: ?70 Need to make dietary changes  40-70 Heart Healthy Diet ? 40 Therapeutic Level Cholesterol Diet  [facility]  Picture Your Plate Total Score on Admission 64      Picture Your Plate Scores: <4<88nhealthy dietary pattern with much room for improvement. 41-50 Dietary pattern unlikely to meet recommendations for good health and room for improvement. 51-60 More healthful dietary pattern, with some room for improvement.  >60 Healthy dietary pattern, although there may be some specific behaviors that could be improved.   Nutrition Goals Re-Evaluation:  Nutrition Goals Re-Evaluation     [place] 10/02/21 1139              Goals   Current Weight 262 lb (118.8 kg)       Nutrition Goal Continues to defer RD appointment.       Comment [place] she is working with her bariatric doctor's office for her nutritional needs.  She currently defers a Counselling psychologist.       Expected Outcome ST: continue meeting with bariatric diet plan LT: weigh less than 200 lbs.                Nutrition Goals Discharge (Final Nutrition Goals Re-Evaluation):  Nutrition Goals Re-Evaluation - 10/02/21 1139       Goals   Current Weight 262 lb (118.8 kg)    Nutrition Goal Continues to defer RD appointment.    Comment [place] states she is working with her bariatric doctor's office for her nutritional needs. She currently defers a Counselling psychologist.    Expected Outcome ST: continue meeting with bariatric diet plan LT: weigh less than 200 lbs.             Psychosocial: Target Goals: Acknowledge presence or absence of significant depression and/or stress, maximize coping skills, provide positive support system. Participant is able to verbalize types and ability to use techniques and skills needed for reducing stress and depression.   Education: Stress, Anxiety, and Depression - Group verbal and visual presentation to define topics covered.  Reviews how body is impacted by stress, anxiety, and depression.  Also discusses healthy ways to reduce stress and to treat/manage anxiety and depression.  Written material given at graduation.   Education: Sleep Hygiene -Provides group verbal and written instruction about how sleep can affect your health.  Define sleep hygiene, discuss sleep cycles and impact of sleep habits. Review good sleep hygiene tips.    Initial Review & Psychosocial Screening:  Initial Psych Review & Screening - 10/02/21 1133       Initial Review   Current issues with Current Depression;Current Psychotropic Meds;History of Depression;Current Stress Concerns;Current Sleep Concerns              Quality of Life Scores:  Scores of 19 and below usually indicate a poorer quality of life in these areas.  A difference of  2-3 points is a clinically meaningful difference.  A difference of 2-3 points in the total score of the Quality of Life Index has been associated with significant improvement in overall quality of life, self-image, physical symptoms, and general health in studies assessing change in quality of life.  PHQ-9: Review Flowsheet  More data exists      09/11/2021 08/17/2021 06/01/2021 01/09/2021 10/04/2020  Depression screen PHQ 2/9  Decreased Interest 1 1  0 0  Down, Depressed, Hopeless 1 1  0 3  PHQ - 2 Score 2 2  0 3  Altered sleeping 3 0  - 0  Tired, decreased energy 2 1  - 3  Change in appetite 0 0  - 0  Feeling bad or failure about yourself  2 2  - 3  Trouble concentrating 2 2  - 0  Moving slowly or fidgety/restless 1 0  - 0  Suicidal thoughts 0 0  - 0  PHQ-9 Score 12 7  - 9  Difficult doing work/chores Somewhat difficult Somewhat difficult - - Not difficult at all    Details       Information is confidential and restricted. Go to Review Flowsheets to unlock data.        Interpretation of Total Score  Total Score Depression Severity:  1-4 = Minimal depression, 5-9 = Mild depression, 10-14 = Moderate depression, 15-19 = Moderately severe depression, 20-27 = Severe depression   Psychosocial Evaluation and Intervention:  Psychosocial Evaluation - 08/14/21 1129       Psychosocial Evaluation &  Interventions   Interventions Stress management education;Encouraged to exercise with the program and follow exercise prescription    Comments She is out of work with a sever autistic son. She is generally negative and is trying to be more positive. She talks to a therapist and takes medsication to help her cope better. She is determined to exercise, get weight off and be more positive.    Expected Outcomes Short: Start [facility] to help with mood. Long: Maintain a  healthy mental state.    Continue Psychosocial Services  Follow up required by staff             Psychosocial Re-Evaluation:  Psychosocial Re-Evaluation     [place] Name 09/11/21 1137 10/02/21 1133           Psychosocial Re-Evaluation   Current issues with Current Depression;Current Anxiety/Panic;Current Sleep Concerns;Current Psychotropic Meds Current Depression;Current Anxiety/Panic;Current Sleep Concerns;Current Psychotropic Meds;Current Stress Concerns      Comments Brandy [name] reports doing well with her mental health and is now on medication as well as seeing a therapist 1x/week. She relies on her husband, best friend, therapist, and doctors for support. She reports walking, driving in nice weather, and staying busy to reduce stressors. She is not sleeping well right now due to stomach pain - she is working with her MD on this. Brandy [name] states that therapy has been good and she continues to go. She is compliant with her medication and states they are effective. Sleep has improved, but she desires for continued improvement. She feels the biggest barrier to sleeping well is caring for her special-needs son to monitor his behavior. Her husband is supportive and helps in caring for the son and other duties. Stress: her daughter's car is broke-down now and they is helping her with transportation of her to work and her child to school. Managing stress with exercise and talking with her husband.      Expected Outcomes ST: continue to do stress reducing activites  LT: maintain positive attitude ST: continue to attend rehab, exercise at home, and attend therapy. LT: continue to maintain positive attitude and utilize exercise for stress management      Interventions Stress management education;Encouraged to attend Cardiac Rehabilitation for the exercise Stress management education;Encouraged to attend Pulmonary Rehabilitation for the exercise      Continue Psychosocial Services  Follow up required by staff Follow  up required by staff        Initial Review   Source of Stress Concerns -- Chronic Illness;Occupation;Family      Comments -- She continues to care for her son and help her other children. She seems positive with her current situation and reaches out for help as needed. She is taking her medication and seeing her therapist.               Psychosocial Discharge (Final Psychosocial Re-Evaluation):  Psychosocial Re-Evaluation - 10/02/21 1133       Psychosocial Re-Evaluation   Current issues with Current Depression;Current Anxiety/Panic;Current Sleep Concerns;Current Psychotropic Meds;Current Stress Concerns    Comments Brandy [name] states that therapy has been good and she continues to go. She is compliant with her medication and states they are effective. Sleep has improved, but she desires for continued improvement. She feels the biggest barrier to sleeping well is caring for her special-needs son to monitor his behavior. Her husband is supportive and helps in caring for the son and other duties. Stress: her daughter's car is broke-down now and they is helping her with  transportation of her to work and her child to school. Managing stress with exercise and talking with her husband.    Expected Outcomes ST: continue to attend rehab, exercise at home, and attend therapy. LT: continue to maintain positive attitude and utilize exercise for stress management    Interventions Stress management education;Encouraged to attend Pulmonary Rehabilitation for the exercise    Continue Psychosocial Services  Follow up required by staff      Initial Review   Source of Stress Concerns Chronic Illness;Occupation;Family    Comments She continues to care for her son and help her other children. She seems positive with her current situation and reaches out for help as needed. She is taking her medication and seeing her therapist.             Education: Education Goals: Education classes will be provided on a  weekly basis, covering required topics. Participant will state understanding/return demonstration of topics presented.  Learning Barriers/Preferences:  Learning Barriers/Preferences - 08/14/21 1123       Learning Barriers/Preferences   Learning Barriers Sight    Learning Preferences Skilled Demonstration;Video             General Pulmonary Education Topics:  Infection Prevention: - Provides verbal and written material to individual with discussion of infection control including proper hand washing and proper equipment cleaning during exercise session. Flowsheet Row Pulmonary Rehab from 08/14/2021 in [facility] Cardiac and Pulmonary Rehab  Date 08/14/21  Educator [facility]  Instruction Review Code 1- Verbalizes Understanding       Falls Prevention: - Provides verbal and written material to individual with discussion of falls prevention and safety. Flowsheet Row Pulmonary Rehab from 08/14/2021 in [facility] Cardiac and Pulmonary Rehab  Date 08/14/21  Educator [facility]  Instruction Review Code 1- Verbalizes Understanding       Chronic Lung Disease Review: - Group verbal instruction with posters, models, PowerPoint presentations and videos,  to review new updates, new respiratory medications, new advancements in procedures and treatments. Providing information on websites and "800" numbers for continued self-education. Includes information about supplement oxygen, available portable oxygen systems, continuous and intermittent flow rates, oxygen safety, concentrators, and Medicare reimbursement for oxygen. Explanation of Pulmonary Drugs, including class, frequency, complications, importance of spacers, rinsing mouth after steroid MDI's, and proper cleaning methods for nebulizers. Review of basic lung anatomy and physiology related to function, structure, and complications of lung disease. Review of risk factors. Discussion about methods for diagnosing sleep apnea and types of masks and machines for OSA.  Includes a review of the use of types of environmental controls: home humidity, furnaces, filters, dust mite/pet prevention, HEPA vacuums. Discussion about weather changes, air quality and the benefits of nasal washing. Instruction on Warning signs, infection symptoms, calling MD promptly, preventive modes, and value of vaccinations. Review of effective airway clearance, coughing and/or vibration techniques. Emphasizing that all should Create an Action Plan. Written material given at graduation. Flowsheet Row Pulmonary Rehab from 08/17/2021 in [facility] Cardiac and Pulmonary Rehab  Education need identified 08/17/21       AED/CPR: - Group verbal and written instruction with the use of models to demonstrate the basic use of the AED with the basic ABC's of resuscitation.    Anatomy and Cardiac Procedures: - Group verbal and visual presentation and models provide information about basic cardiac anatomy and function. Reviews the testing methods done to diagnose heart disease and the outcomes of the test results. Describes the treatment choices: Medical Management, Angioplasty, or Coronary Bypass  Surgery for treating various heart conditions including Myocardial Infarction, Angina, Valve Disease, and Cardiac Arrhythmias.  Written material given at graduation.   Medication Safety: - Group verbal and visual instruction to review commonly prescribed medications for heart and lung disease. Reviews the medication, class of the drug, and side effects. Includes the steps to properly store meds and maintain the prescription regimen.  Written material given at graduation.   Other: -Provides group and verbal instruction on various topics (see comments)   Knowledge Questionnaire Score:  Knowledge Questionnaire Score - 08/17/21 1702       Knowledge Questionnaire Score   Pre Score 16/18: Exercise, Oxygen              Core Components/Risk Factors/Patient Goals at Admission:  Personal Goals and Risk  Factors at Admission - 08/17/21 1720       Core Components/Risk Factors/Patient Goals on Admission    Weight Management Yes;Obesity;Weight Loss    Intervention Weight Management: Develop a combined nutrition and exercise program designed to reach desired caloric intake, while maintaining appropriate intake of nutrient and fiber, sodium and fats, and appropriate energy expenditure required for the weight goal.;Weight Management: Provide education and appropriate resources to help participant work on and attain dietary goals.;Weight Management/Obesity: Establish reasonable short term and long term weight goals.;Obesity: Provide education and appropriate resources to help participant work on and attain dietary goals.    Admit Weight 295 lb (133.8 kg)    Goal Weight: Short Term 290 lb (131.5 kg)    Goal Weight: Long Term 240 lb (108.9 kg)    Expected Outcomes Short Term: Continue to assess and modify interventions until short term weight is achieved;Long Term: Adherence to nutrition and physical activity/exercise program aimed toward attainment of established weight goal;Weight Loss: Understanding of general recommendations for a balanced deficit meal plan, which promotes 1-2 lb weight loss per week and includes a negative energy balance of [phone] kcal/d;Understanding recommendations for meals to include 15-35% energy as protein, 25-35% energy from fat, 35-60% energy from carbohydrates, less than 247m of dietary cholesterol, 20-35 gm of total fiber daily;Understanding of distribution of calorie intake throughout the day with the consumption of 4-5 meals/snacks    Improve shortness of breath with ADL's Yes    Intervention Provide education, individualized exercise plan and daily activity instruction to help decrease symptoms of SOB with activities of daily living.    Expected Outcomes Short Term: Improve cardiorespiratory fitness to achieve a reduction of symptoms when performing ADLs;Long Term: Be able to  perform more ADLs without symptoms or delay the onset of symptoms    Diabetes Yes    Intervention Provide education about signs/symptoms and action to take for hypo/hyperglycemia.;Provide education about proper nutrition, including hydration, and aerobic/resistive exercise prescription along with prescribed medications to achieve blood glucose in normal ranges: Fasting glucose 65-99 mg/dL    Expected Outcomes Short Term: Participant verbalizes understanding of the signs/symptoms and immediate care of hyper/hypoglycemia, proper foot care and importance of medication, aerobic/resistive exercise and nutrition plan for blood glucose control.;Long Term: Attainment of HbA1C < 7%.    Hypertension Yes    Intervention Monitor prescription use compliance.;Provide education on lifestyle modifcations including regular physical activity/exercise, weight management, moderate sodium restriction and increased consumption of fresh fruit, vegetables, and low fat dairy, alcohol moderation, and smoking cessation.    Expected Outcomes Short Term: Continued assessment and intervention until BP is < 140/958mHG in hypertensive participants. < 130/8083mG in hypertensive participants with diabetes, heart failure or  chronic kidney disease.;Long Term: Maintenance of blood pressure at goal levels.    Lipids Yes    Intervention Provide education and support for participant on nutrition & aerobic/resistive exercise along with prescribed medications to achieve LDL <8m, HDL >453m    Expected Outcomes Short Term: Participant states understanding of desired cholesterol values and is compliant with medications prescribed. Participant is following exercise prescription and nutrition guidelines.;Long Term: Cholesterol controlled with medications as prescribed, with individualized exercise RX and with personalized nutrition plan. Value goals: LDL < 7022mHDL > 40 mg.             Education:Diabetes - Individual verbal and written  instruction to review signs/symptoms of diabetes, desired ranges of glucose level fasting, after meals and with exercise. Acknowledge that pre and post exercise glucose checks will be done for 3 sessions at entry of program. Flowsheet [facility]  Date 08/14/21  Educ[facility] Code 1- Verbalizes Understanding       Know Your Numbers and Heart Failure: - Group verbal and visual instruction to discuss disease risk factors for cardiac and pulmonary disease and treatment options.  Reviews associated critical values for Overweight/Obesity, Hypertension, Cholesterol, and Diabetes.  Discusses basics of heart failure: signs/symptoms and treatments.  Introduces Heart Failure Zone chart for action plan for heart failure.  Written material given at graduation.   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 She reports having stomach pain which is making it hard to tolerate food and her medications; she is unable to take her gastric bypass vitamins - her doctor suggested taking 2 MVI per day in the interim. her doctor is going to order a CT to investigate causes. She continues to check her BG at home in the monring - running in the 90s; her last A1C is now 7%, she is due for a new one soon. She is dealing with muscle soreness from moving her body more due to rehab and exercise at home - she takes tylenol for this. Brandy [name] to lose weight since the bypass surgery. She is currently at 262 lb per her home scales. Her goal is to lose below 200 lbs. She states that  weight loss is sporadic, losing larger amounts then plateauing. She understands that this is related to the surgery. She is having an upper GI on 6/6 to determine if she is having a food intolerance. Diabetes: Brandy [name] her sugars have been good but they are creeping up and has an appointment scheduled this week. She has been drinking orange juice and thinks that is related, but has been diluting the orange juice with water to reduce the sugar level and having crystal light. She isn't currently on any diabetic medication and her average BG level is 90-100 fasting. She will discuss this further with her physician to determine their goals for her. ADLs: Pt says it's easier to stand for longer periods of time with less sitting breaks to wash dishes. Brandy [name] been walking to rehab and finds it easier to get up from the couch. However, she has some  instability/balance concerns and was encouraged to use assistance to stabilize self. She states her husband is mindful and supportive of her during her instability. She will be discussing this concern with her physician on Thursday, 6/7. Hypertension: Brandy [name] states her blood pressure has remained stable.      Expected Outcomes ST: get CT and f/u with doctor for stomach pain LT: continue to manage risk factors ST: continue to monitor blood sugar and talk with PCP about blood sugars and instability;  LT: continue to manage lifestyle risk factors               Core Components/Risk Factors/Patient Goals at Discharge (Final Review):   Goals and Risk Factor Review - 10/02/21 1122       Core Components/Risk Factors/Patient Goals Review   Personal Goals Review Weight Management/Obesity;Hypertension;Diabetes;Improve shortness of breath with ADL's    Review Brandy [name] continues to lose weight since the bypass surgery. She is currently at 262 lb per her home scales. Her goal is to lose below 200 lbs. She states that weight loss is sporadic, losing larger amounts then plateauing.  She understands that this is related to the surgery. She is having an upper GI on 6/6 to determine if she is having a food intolerance. Diabetes: Brandy [name] says her sugars have been good but they are creeping up and has an appointment scheduled this week. She has been drinking orange juice and thinks that is related, but has been diluting the orange juice with water to reduce the sugar level and having crystal light. She isn't currently on any diabetic medication and her average BG level is 90-100 fasting. She will discuss this further with her physician to determine their goals for her. ADLs: Pt says it's easier to stand for longer periods of time with less sitting breaks to wash dishes. Brandy [name] has been walking to rehab and finds it easier to get up from the couch. However, she has some instability/balance concerns and was encouraged to use assistance to stabilize self. She states her husband is mindful and supportive of her during her instability. She will be discussing this concern with her physician on Thursday, 6/7. Hypertension: Brandy [name] states her blood pressure has remained stable.    Expected Outcomes ST: continue to monitor blood sugar and talk with PCP about blood sugars and instability;  LT: continue to manage lifestyle risk factors             ITP Comments:  ITP Comments     Row Name 08/14/21 1127 08/17/21 1700 08/21/21 1015 09/13/21 0922 10/11/21 1355   ITP Comments Virtual Visit completed. Patient informed on EP and RD appointment and 6 Minute walk test. Patient also informed of patient health questionnaires on My Chart. Patient Verbalizes understanding. Visit diagnosis can be found in Pain Treatment [facility] 04/27/2022. Completed 6MWT and gym orientation. Initial ITP created and sent for review to Dr. Ottie Glazier,  Medical Director. First full day of exercise!  Patient was oriented to gym and equipment including functions, settings, policies, and procedures.  Patient's individual exercise prescription and treatment  plan were reviewed.  All starting workloads were established based on the results of the 6 minute walk test done at initial orientation visit.  The plan for exercise progression was also introduced and progression will be customized based on patient's performance and goals. 30 Day review completed. Medical Director ITP review done, changes made as directed, and signed approval by Medical Director. 30 Day review completed. Medical Director ITP review done, changes made as  directed, and signed approval by Medical Director.            Comments:

## 2021-10-12 ENCOUNTER — Ambulatory Visit (INDEPENDENT_AMBULATORY_CARE_PROVIDER_SITE_OTHER): Payer: BC Managed Care – PPO | Admitting: Psychology

## 2021-10-12 DIAGNOSIS — R0609 Other forms of dyspnea: Secondary | ICD-10-CM

## 2021-10-12 DIAGNOSIS — I5032 Chronic diastolic (congestive) heart failure: Secondary | ICD-10-CM | POA: Diagnosis not present

## 2021-10-12 DIAGNOSIS — F339 Major depressive disorder, recurrent, unspecified: Secondary | ICD-10-CM

## 2021-10-12 DIAGNOSIS — R0600 Dyspnea, unspecified: Secondary | ICD-10-CM | POA: Diagnosis not present

## 2021-10-12 DIAGNOSIS — Z6841 Body Mass Index (BMI) 40.0 and over, adult: Secondary | ICD-10-CM | POA: Diagnosis not present

## 2021-10-12 DIAGNOSIS — E1165 Type 2 diabetes mellitus with hyperglycemia: Secondary | ICD-10-CM | POA: Diagnosis not present

## 2021-10-12 NOTE — Progress Notes (Signed)
Daily Session Note  Patient Details  Name: Brandy [name] MRN: [id number] Date of Birth: 07-30-1965 Referring Provider:   Flowsheet Row Pulmonary Rehab from 08/17/2021 in [facility] Cardiac and Pulmonary Rehab  Referring Provider Ida Rogue MD       Encounter Date: 10/12/2021  Check In:  Session Check In - 10/12/21 1118       Check-In   Supervising physician immediately available to respond to emergencies See telemetry face sheet for immediately available ER MD    Location [facility]-Cardiac & Pulmonary Rehab    Staff Present Vida Rigger, RN, BSN;Joseph [place], RCP,RRT,BSRT;Melissa [place], RDN, LDN;Jessica [place], MA, RCEP, CCRP, [place], [place], ASCM CEP, Exercise Physiologist    Virtual Visit No    Medication changes reported     No    Fall or balance concerns reported    Yes    Comments Pt tripped and fell 10/11/2021, no injuries, no issues.    Warm-up and Cool-down Performed on first and last piece of equipment    Resistance Training Performed Yes    VAD Patient? No    PAD/SET Patient? No      Pain Assessment   Currently in Pain? No/denies                Social History   Tobacco Use  Smoking Status Never  Smokeless Tobacco Never    Goals Met:  Proper associated with RPD/PD & O2 Sat Independence with exercise equipment Using PLB without cueing & demonstrates good technique Exercise tolerated well No report of concerns or symptoms today Strength training completed today  Goals Unmet:  Not Applicable  Comments: Pt able to follow exercise prescription today without complaint.  Will continue to monitor for progression.   Dr. Emily Filbert is Medical Director for [place].  Dr. Ottie Glazier is Medical Director for [facility] Pulmonary Rehabilitation.

## 2021-10-12 NOTE — Progress Notes (Signed)
[place] Counselor/Therapist Progress Note  Patient ID: Brandy [name], Brandy [name]    Date: 10/12/21  Time Spent: 12:11 pm - 1:01  pm:  50 Minutes  Treatment Type: Individual Therapy.  Reported Symptoms: Anxiety   Mental Status Exam: Appearance:  Fairly Groomed     Behavior: Appropriate  Motor: Normal  Speech/Language:  Clear and Coherent and Normal Rate  Affect: Appropriate  Mood: normal  Thought process: normal  Thought content:   WNL  Sensory/Perceptual disturbances:   WNL  Orientation: oriented to person, place, time/date, and situation  Attention: Good  Concentration: Good  Memory: WNL  Fund of knowledge:  Good  Insight:   Good  Judgment:  Good  Impulse Control: Good   Risk Assessment: Danger to Self:  No Self-injurious Behavior: No Danger to Others: No Duty to Warn:no Physical Aggression / Violence:No  Access to Firearms a concern: No  Gang Involvement:No   Subjective:   Brandy [name] participated car, via phone, and consented to treatment. Therapist participated from home office. We met online due to [place] pandemic. Brandy [name] reviewed the events of the past week. Brandy [name] noted recently developing ambulation difficulties.  She is slated to meet with her PCP to discuss concerns. She noted her daughter's financial stressors and attempting to maintain her own boundaries in regards to finances. We explored boundaries during the session and reinforced said boundaries during the session. She noted frustration regarding her son's lack of accountability and commitment towards his chores, which he agreed to. We explored this, her boundaries, and worked on identifying ways to identify points of accountability and repercussions. Therapist reviewed boundary setting and assertive communication. Therapist encouraged Brandy [name] to communicate concerns with her husband and to develop boundaries going forward along with repercussions to boundaries that are not  adhered to. Brandy [name] was engaged and motivated. She expressed commitment towards her goals. Therapist praised Brandy [name] and provided supportive therapy. Therapist provided supportive therapy.   Interventions: interpersonal stressors and assertive communication.  Interventions: Interpersonal   Diagnosis: Episode of recurrent major depressive disorder, unspecified depression episode severity ([place])  Treatment Plan:  [name], forthcoming, self-aware, and motivated for change.   Support System: Friend Brandy [name]) and Family.   Client Treatment Preferences Outpatient Therapy.  Client Statement of Needs Brandy [name]'s goals for treatment managing her mood and symptoms, processing past events, focus on self-care, set boundaries for self and others, decrease negative self-talk, decrease rumination, process relationship stressors, and improve health.   Treatment Level Weekly  Symptoms  Depressive symptoms: Decreased interest, feeling down, poor sleep, lethargy, psycho-motor retardation, feeling bad about self.  (Status: maintained)  Anxiety: Difficulty managing worry, worrying about different things, restlessness, irritability, afraid something awful might happen  (Status: maintained)  Goals:  Brandy [name] experiences symptoms of depression and anxiety.   Target Date: 2022-06-08 Frequency: Weekly  Progress: 10 Modality: individual    Therapist will provide referrals for additional resources as appropriate.  Therapist will provide psycho-education regarding Brandy [name]'s diagnosis and corresponding treatment approaches and interventions. Licensed Clinical Social Worker, [place], LCSW will support the patient's ability to achieve the goals identified. will employ CBT, BA, Problem-solving, Solution Focused, Mindfulness,  coping skills, & other evidenced-based practices will be used to promote progress towards healthy functioning to help manage decrease symptoms associated with her  diagnosis.   Reduce overall level, frequency, and intensity of the feelings of depression and anxiety as evidenced by decreased overall symptoms of depression and anxiety from 6 to 7  days/week to 0 to 1 days/week per client report for at least 3 consecutive months. Verbally express understanding of the relationship between feelings of depression, anxiety and their impact on thinking patterns and behaviors. Verbalize an understanding of the role that distorted thinking plays in creating fears, excessive worry, and ruminations.  Brandy [name] participated in the creation of the treatment plan)    [name], LCSW

## 2021-10-13 ENCOUNTER — Encounter: Payer: BC Managed Care – PPO | Admitting: *Deleted

## 2021-10-13 DIAGNOSIS — Z6841 Body Mass Index (BMI) 40.0 and over, adult: Secondary | ICD-10-CM | POA: Diagnosis not present

## 2021-10-13 DIAGNOSIS — I5032 Chronic diastolic (congestive) heart failure: Secondary | ICD-10-CM | POA: Diagnosis not present

## 2021-10-13 DIAGNOSIS — R0600 Dyspnea, unspecified: Secondary | ICD-10-CM | POA: Diagnosis not present

## 2021-10-13 DIAGNOSIS — E1165 Type 2 diabetes mellitus with hyperglycemia: Secondary | ICD-10-CM | POA: Diagnosis not present

## 2021-10-13 DIAGNOSIS — R0609 Other forms of dyspnea: Secondary | ICD-10-CM | POA: Diagnosis not present

## 2021-10-13 NOTE — Progress Notes (Signed)
Daily Session Note  Patient Details  Name: Brandy [name] MRN: [id number] Date of Birth: 1965/11/22 Referring Provider:   Flowsheet Row Pulmonary Rehab from 08/17/2021 in [facility] Cardiac and Pulmonary Rehab  Referring Provider Ida Rogue MD       Encounter Date: 10/13/2021  Check In:  Session Check In - 10/13/21 1145       Check-In   Supervising physician immediately available to respond to emergencies See telemetry face sheet for immediately available ER MD    Location [facility]-Cardiac & Pulmonary Rehab    Staff Present Heath Lark, RN, BSN, CCRP;Jessica [place], MA, RCEP, CCRP, CCET;Joseph [place], [place]    Virtual Visit No    Medication changes reported     No    Fall or balance concerns reported    No    Warm-up and Cool-down Performed on first and last piece of equipment    Resistance Training Performed Yes    VAD Patient? No    PAD/SET Patient? No      Pain Assessment   Currently in Pain? No/denies                Social History   Tobacco Use  Smoking Status Never  Smokeless Tobacco Never    Goals Met:  Proper associated with RPD/PD & O2 Sat Independence with exercise equipment Exercise tolerated well No report of concerns or symptoms today  Goals Unmet:  Not Applicable  Comments: Pt able to follow exercise prescription today without complaint.  Will continue to monitor for progression.    Dr. Emily Filbert is Medical Director for [place].  Dr. Ottie Glazier is Medical Director for [facility] Pulmonary Rehabilitation.

## 2021-10-16 ENCOUNTER — Encounter: Payer: BC Managed Care – PPO | Admitting: *Deleted

## 2021-10-16 ENCOUNTER — Encounter: Payer: Self-pay | Admitting: Primary Care

## 2021-10-16 DIAGNOSIS — R0600 Dyspnea, unspecified: Secondary | ICD-10-CM | POA: Diagnosis not present

## 2021-10-16 DIAGNOSIS — E1165 Type 2 diabetes mellitus with hyperglycemia: Secondary | ICD-10-CM | POA: Diagnosis not present

## 2021-10-16 DIAGNOSIS — R0609 Other forms of dyspnea: Secondary | ICD-10-CM | POA: Diagnosis not present

## 2021-10-16 DIAGNOSIS — Z6841 Body Mass Index (BMI) 40.0 and over, adult: Secondary | ICD-10-CM | POA: Diagnosis not present

## 2021-10-16 DIAGNOSIS — I5032 Chronic diastolic (congestive) heart failure: Secondary | ICD-10-CM | POA: Diagnosis not present

## 2021-10-16 NOTE — Progress Notes (Signed)
Daily Session Note  Patient Details  Name: Brandy [name] MRN: [id number] Date of Birth: 12/10/1965 Referring Provider:   Flowsheet Row Pulmonary Rehab from 08/17/2021 in [facility] Cardiac and Pulmonary Rehab  Referring Provider Gollan, Timothy MD       Encounter Date: 10/16/2021  Check In:  Session Check In - 10/16/21 1121       Check-In   Supervising physician immediately available to respond to emergencies See telemetry face sheet for immediately available ER MD    Location [facility]-Cardiac & Pulmonary Rehab    Staff Present Susanne Bice, RN, BSN, CCRP;Kelly Hayes, BS, ACSM CEP, Exercise Physiologist;Kelly Bollinger, MPA, RN    Virtual Visit No    Medication changes reported     No    Fall or balance concerns reported    No    Warm-up and Cool-down Performed on first and last piece of equipment    Resistance Training Performed Yes    VAD Patient? No    PAD/SET Patient? No      Pain Assessment   Currently in Pain? No/denies                Social History   Tobacco Use  Smoking Status Never  Smokeless Tobacco Never    Goals Met:  Proper associated with RPD/PD & O2 Sat Independence with exercise equipment Exercise tolerated well No report of concerns or symptoms today  Goals Unmet:  Not Applicable  Comments: Pt able to follow exercise prescription today without complaint.  Will continue to monitor for progression.    Dr. Mark Miller is Medical Director for [facility]iac [facility].  Dr. Fuad Aleskerov is Medical Director for [facility] Pulmonary Rehabilitation. 

## 2021-10-19 ENCOUNTER — Ambulatory Visit (INDEPENDENT_AMBULATORY_CARE_PROVIDER_SITE_OTHER): Payer: BC Managed Care – PPO | Admitting: Psychology

## 2021-10-19 DIAGNOSIS — F339 Major depressive disorder, recurrent, unspecified: Secondary | ICD-10-CM

## 2021-10-19 NOTE — Progress Notes (Signed)
[place] Counselor/Therapist Progress Note  Patient ID: Brandy [name], MRN: [id number]    Date: 10/19/21  Time Spent: 12:03 pm - 12:57 pm:  54 Minutes  Treatment Type: Individual Therapy.  Reported Symptoms: Anxiety   Mental Status Exam: Appearance:  Fairly Groomed     Behavior: Appropriate  Motor: Normal  Speech/Language:  Clear and Coherent and Normal Rate  Affect: Appropriate  Mood: normal  Thought process: normal  Thought content:   WNL  Sensory/Perceptual disturbances:   WNL  Orientation: oriented to person, place, time/date, and situation  Attention: Good  Concentration: Good  Memory: WNL  Fund of knowledge:  Good  Insight:   Good  Judgment:  Good  Impulse Control: Good   Risk Assessment: Danger to Self:  No Self-injurious Behavior: No Danger to Others: No Duty to Warn:no Physical Aggression / Violence:No  Access to Firearms a concern: No  Gang Involvement:No   Subjective:   Larua Collier Hosein participated car, via video, and consented to treatment. Therapist participated from home office. We met online due to [place] pandemic. Zeniyah Peaster Guttierrez reviewed the events of the past week. Caroline noted some relief in her anxiety due to her friend receiving a clear bill of health after a cancer scare. We continued to process her strained relationship with her daughter. We worked on identifying her own boundaries to ensure that others' address their own issues without her involvement. She noted her worry regarding her daughter's lack of boundaries for her own daughter and the possible effect on her grand-daughter, long-term. She noted a need for additional emotional intimacy with her husband and her attempts to communicate this to no avail. Therapist highlighted indirect communication and worked to identify more clear and concise language to use going forward. We reviewed communication, during the session. Tilda was engaged and motivated. She expressed commitment  towards her goals. Therapist praised Darnita and provided supportive therapy. Therapist provided supportive therapy.   Interventions: interpersonal stressors and assertive communication.  Interventions: Interpersonal   Diagnosis: Episode of recurrent major depressive disorder, unspecified depression episode severity ([place])  Treatment Plan:  Client Abilities/Strengths Sharyn is intelligent, forthcoming, self-aware, and motivated for change.   Support System: Friend [name] and Family.   Client Treatment Preferences Outpatient Therapy.  Client Statement of Needs Gabryela's goals for treatment managing her mood and symptoms, processing past events, focus on self-care, set boundaries for self and others, decrease negative self-talk, decrease rumination, process relationship stressors, and improve health.   Treatment Level Weekly  Symptoms  Depressive symptoms: Decreased interest, feeling down, poor sleep, lethargy, psycho-motor retardation, feeling bad about self.  (Status: maintained)  Anxiety: Difficulty managing worry, worrying about different things, restlessness, irritability, afraid something awful might happen  (Status: maintained)  Goals:  Angila experiences symptoms of depression and anxiety.   Target Date: 2022-06-08 Frequency: Weekly  Progress: 10 Modality: individual    Therapist will provide referrals for additional resources as appropriate.  Therapist will provide psycho-education regarding Gilda's diagnosis and corresponding treatment approaches and interventions. Licensed Clinical Social Worker, [place], LCSW will support the patient's ability to achieve the goals identified. will employ CBT, BA, Problem-solving, Solution Focused, Mindfulness,  coping skills, & other evidenced-based practices will be used to promote progress towards healthy functioning to help manage decrease symptoms associated with her diagnosis.   Reduce overall level, frequency, and intensity of the  feelings of depression and anxiety as evidenced by decreased overall symptoms of depression and anxiety from 6 to 7 days/week to  0 to 1 days/week per client report for at least 3 consecutive months. Verbally express understanding of the relationship between feelings of depression, anxiety and their impact on thinking patterns and behaviors. Verbalize an understanding of the role that distorted thinking plays in creating fears, excessive worry, and ruminations.  [name] participated in the creation of the treatment plan)    [name], LCSW

## 2021-10-23 ENCOUNTER — Encounter: Payer: BC Managed Care – PPO | Admitting: *Deleted

## 2021-10-23 DIAGNOSIS — Z6841 Body Mass Index (BMI) 40.0 and over, adult: Secondary | ICD-10-CM | POA: Diagnosis not present

## 2021-10-23 DIAGNOSIS — R0600 Dyspnea, unspecified: Secondary | ICD-10-CM | POA: Diagnosis not present

## 2021-10-23 DIAGNOSIS — E1165 Type 2 diabetes mellitus with hyperglycemia: Secondary | ICD-10-CM | POA: Diagnosis not present

## 2021-10-23 DIAGNOSIS — R0609 Other forms of dyspnea: Secondary | ICD-10-CM | POA: Diagnosis not present

## 2021-10-23 DIAGNOSIS — I5032 Chronic diastolic (congestive) heart failure: Secondary | ICD-10-CM | POA: Diagnosis not present

## 2021-10-23 NOTE — Progress Notes (Signed)
Daily Session Note  Patient Details  Name: LAMIS MCCULLY MRN: [id number] Date of Birth: 01/05/1966 Referring Provider:   Flowsheet Row Pulmonary Rehab from 08/17/2021 in [facility] Cardiac and Pulmonary Rehab  Referring Provider Julien Nordmann MD       Encounter Date: 10/23/2021  Check In:  Session Check In - 10/23/21 1151       Check-In   Supervising physician immediately available to respond to emergencies See telemetry face sheet for immediately available ER MD    Location [facility]-Cardiac & Pulmonary Rehab    Staff Present Kelton Pillar, MPA, RN;Joseph [place], RCP,RRT,BSRT;Kelly Madilyn Fireman, BS, ACSM CEP, Exercise Physiologist    Virtual Visit No    Medication changes reported     No    Fall or balance concerns reported    No    Warm-up and Cool-down Performed on first and last piece of equipment    Resistance Training Performed Yes    VAD Patient? No    PAD/SET Patient? No      Pain Assessment   Currently in Pain? No/denies                Social History   Tobacco Use  Smoking Status Never  Smokeless Tobacco Never    Goals Met:  Proper associated with RPD/PD & O2 Sat Independence with exercise equipment Exercise tolerated well No report of concerns or symptoms today  Goals Unmet:  Not Applicable  Comments: Pt able to follow exercise prescription today without complaint.  Will continue to monitor for progression.    Dr. Bethann Punches is Medical Director for [facility] Cardiac Rehabilitation.  Dr. Vida Rigger is Medical Director for [facility] Pulmonary Rehabilitation.

## 2021-10-25 DIAGNOSIS — R0609 Other forms of dyspnea: Secondary | ICD-10-CM | POA: Diagnosis not present

## 2021-10-25 DIAGNOSIS — R0600 Dyspnea, unspecified: Secondary | ICD-10-CM

## 2021-10-25 DIAGNOSIS — Z6841 Body Mass Index (BMI) 40.0 and over, adult: Secondary | ICD-10-CM | POA: Diagnosis not present

## 2021-10-25 DIAGNOSIS — E1165 Type 2 diabetes mellitus with hyperglycemia: Secondary | ICD-10-CM | POA: Diagnosis not present

## 2021-10-25 DIAGNOSIS — I5032 Chronic diastolic (congestive) heart failure: Secondary | ICD-10-CM | POA: Diagnosis not present

## 2021-10-25 NOTE — Progress Notes (Signed)
Daily Session Note  Patient Details  Name: Brandy [name] MRN: [id number] Date of Birth: 1966-02-28 Referring Provider:   Flowsheet Row Pulmonary Rehab from 08/17/2021 in [facility] Cardiac and Pulmonary Rehab  Referring Provider Ida Rogue MD       Encounter Date: 10/25/2021  Check In:  Session Check In - 10/25/21 1114       Check-In   Supervising physician immediately available to respond to emergencies See telemetry face sheet for immediately available ER MD    Location [facility]-Cardiac & Pulmonary Rehab    Staff Present Carson Myrtle, BS, RRT, CPFT;Joseph Karie Fetch, MPA, RN    Virtual Visit No    Medication changes reported     No    Fall or balance concerns reported    No    Warm-up and Cool-down Performed on first and last piece of equipment    Resistance Training Performed Yes    VAD Patient? No    PAD/SET Patient? No      Pain Assessment   Currently in Pain? No/denies                Social History   Tobacco Use  Smoking Status Never  Smokeless Tobacco Never    Goals Met:  Independence with exercise equipment Exercise tolerated well No report of concerns or symptoms today Strength training completed today  Goals Unmet:  Not Applicable  Comments: Pt able to follow exercise prescription today without complaint.  Will continue to monitor for progression.    Dr. Emily Filbert is Medical Director for [place].  Dr. Ottie Glazier is Medical Director for [facility] Pulmonary Rehabilitation.

## 2021-10-26 ENCOUNTER — Other Ambulatory Visit: Payer: Self-pay | Admitting: Primary Care

## 2021-10-26 ENCOUNTER — Ambulatory Visit: Payer: BC Managed Care – PPO | Admitting: Psychology

## 2021-10-26 DIAGNOSIS — J302 Other seasonal allergic rhinitis: Secondary | ICD-10-CM

## 2021-10-26 NOTE — Telephone Encounter (Signed)
This patient needs to be placed at the end of a session. She is scheduled for 11/09/21 for 11am. Can we move her to the 12:20 pm slot?

## 2021-10-27 ENCOUNTER — Encounter: Payer: BC Managed Care – PPO | Admitting: *Deleted

## 2021-10-27 DIAGNOSIS — I5032 Chronic diastolic (congestive) heart failure: Secondary | ICD-10-CM | POA: Diagnosis not present

## 2021-10-27 DIAGNOSIS — R0600 Dyspnea, unspecified: Secondary | ICD-10-CM

## 2021-10-27 DIAGNOSIS — R0609 Other forms of dyspnea: Secondary | ICD-10-CM | POA: Diagnosis not present

## 2021-10-27 DIAGNOSIS — Z6841 Body Mass Index (BMI) 40.0 and over, adult: Secondary | ICD-10-CM | POA: Diagnosis not present

## 2021-10-27 DIAGNOSIS — E1165 Type 2 diabetes mellitus with hyperglycemia: Secondary | ICD-10-CM | POA: Diagnosis not present

## 2021-10-27 NOTE — Progress Notes (Signed)
Daily Session Note  Patient Details  Name: GEM CONKLE MRN: [id number] Date of Birth: 06/07/1965 Referring Provider:   Flowsheet Row Pulmonary Rehab from 08/17/2021 in [facility] Cardiac and Pulmonary Rehab  Referring Provider Ida Rogue MD       Encounter Date: 10/27/2021  Check In:  Session Check In - 10/27/21 1144       Check-In   Supervising physician immediately available to respond to emergencies See telemetry face sheet for immediately available ER MD    Location [facility]-Cardiac & Pulmonary Rehab    Staff Present Heath Lark, RN, BSN, CCRP;Jessica [place], MA, RCEP, CCRP, CCET;Joseph [place], [place]    Virtual Visit No    Medication changes reported     No    Fall or balance concerns reported    No    Warm-up and Cool-down Performed on first and last piece of equipment    Resistance Training Performed Yes    VAD Patient? No    PAD/SET Patient? No      Pain Assessment   Currently in Pain? No/denies                Social History   Tobacco Use  Smoking Status Never  Smokeless Tobacco Never    Goals Met:  Proper associated with RPD/PD & O2 Sat Independence with exercise equipment Exercise tolerated well No report of concerns or symptoms today  Goals Unmet:  Not Applicable  Comments: Pt able to follow exercise prescription today without complaint.  Will continue to monitor for progression.    Dr. Emily Filbert is Medical Director for [place].  Dr. Ottie Glazier is Medical Director for [facility] Pulmonary Rehabilitation.

## 2021-10-27 NOTE — Telephone Encounter (Signed)
LVM advising change of time of appointment .

## 2021-10-30 ENCOUNTER — Ambulatory Visit (INDEPENDENT_AMBULATORY_CARE_PROVIDER_SITE_OTHER): Payer: BC Managed Care – PPO | Admitting: Psychology

## 2021-10-30 DIAGNOSIS — F339 Major depressive disorder, recurrent, unspecified: Secondary | ICD-10-CM | POA: Diagnosis not present

## 2021-10-30 NOTE — Progress Notes (Signed)
[place] Counselor/Therapist Progress Note  Patient ID: Brandy [name], MRN: [id number]    Date: 10/30/21  Time Spent: 4:05 pm - 4:58 pm:  53 minutes  Treatment Type: Individual Therapy.  Reported Symptoms: Anxiety   Mental Status Exam: Appearance:  Fairly Groomed     Behavior: Appropriate  Motor: Normal  Speech/Language:  Clear and Coherent and Normal Rate  Affect: Appropriate  Mood: normal  Thought process: normal  Thought content:   WNL  Sensory/Perceptual disturbances:   WNL  Orientation: oriented to person, place, time/date, and situation  Attention: Good  Concentration: Good  Memory: WNL  Fund of knowledge:  Good  Insight:   Good  Judgment:  Good  Impulse Control: Good   Risk Assessment: Danger to Self:  No Self-injurious Behavior: No Danger to Others: No Duty to Warn:no Physical Aggression / Violence:No  Access to Firearms a concern: No  Gang Involvement:No   Subjective:   Glynis Hunsucker Popiel participated car, via video, and consented to treatment. Therapist participated from home office. We met online due to [place] pandemic. Lequita Meadowcroft Schreier reviewed the events of the past week. She noted continued strain with her daughter, who is temperamental. She noted her efforts to advocate for self more consistently, which therapist praised. We continued to identify boundaries, going forward. 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 She noted her efforts to maintain her diet and make healthful choices. She noted her attempts to shift her thinking about her eating and overall intake. She noted her attempts to be cautious her her portions and mindful of her decision-making regarding food. Therapist praised Jonna for her effort to manage her weight and intake. She noted a need for more "discipline" and discussed this being helpful, going forward. Therapist encouraged Semaya to define what it means to be disciplined and where she would like to have more  discipline, going forward. Eltha was engaged and motivated. She expressed commitment towards out goals. Therapist praised Nylee and provided supportive therapy.   Interventions: interpersonal stressors and assertive communication.  Interventions: Cognitive Behavioral Therapy and Interpersonal   Diagnosis: Episode of recurrent major depressive disorder, unspecified depression episode severity ([place])  Treatment Plan:  Client Abilities/Strengths Rhodia is intelligent, forthcoming, self-aware, and motivated for change.   Support System: Friend [name] and Family.   Client Treatment Preferences Outpatient Therapy.  Client Statement of Needs Kialee's goals for treatment managing her mood and symptoms, processing past events, focus on self-care, set boundaries for self and others, decrease negative self-talk, decrease rumination, process relationship stressors, and improve health.   Treatment Level Weekly  Symptoms  Depressive symptoms: Decreased interest, feeling down, poor sleep, lethargy, psycho-motor retardation, feeling bad about self.  (Status: maintained)  Anxiety: Difficulty managing worry, worrying about different things, restlessness, irritability, afraid something awful might happen  (Status: maintained)  Goals:  Hebah experiences symptoms of depression and anxiety.   Target Date: 2022-06-08 Frequency: Weekly  Progress: 10 Modality: individual    Therapist will provide referrals for additional resources as appropriate.  Therapist will provide psycho-education regarding Mikalah's diagnosis and corresponding treatment approaches and interventions. Licensed Clinical Social Worker, [place], LCSW will support the patient's ability to achieve the goals identified. will employ CBT, BA, Problem-solving, Solution Focused, Mindfulness,  coping skills, & other evidenced-based practices will be used to promote progress towards healthy functioning to help manage decrease symptoms associated  with her diagnosis.   Reduce overall level, frequency, and intensity of the feelings of depression and  anxiety as evidenced by decreased overall symptoms of depression and anxiety from 6 to 7 days/week to 0 to 1 days/week per client report for at least 3 consecutive months. Verbally express understanding of the relationship between feelings of depression, anxiety and their impact on thinking patterns and behaviors. Verbalize an understanding of the role that distorted thinking plays in creating fears, excessive worry, and ruminations.  [name] participated in the creation of the treatment plan)    [name], LCSW

## 2021-11-02 ENCOUNTER — Ambulatory Visit: Payer: BC Managed Care – PPO | Admitting: Psychology

## 2021-11-03 ENCOUNTER — Encounter: Payer: BC Managed Care – PPO | Attending: General Surgery

## 2021-11-03 DIAGNOSIS — R0609 Other forms of dyspnea: Secondary | ICD-10-CM | POA: Insufficient documentation

## 2021-11-03 DIAGNOSIS — I5032 Chronic diastolic (congestive) heart failure: Secondary | ICD-10-CM | POA: Insufficient documentation

## 2021-11-06 ENCOUNTER — Encounter: Payer: BC Managed Care – PPO | Admitting: *Deleted

## 2021-11-06 DIAGNOSIS — I5032 Chronic diastolic (congestive) heart failure: Secondary | ICD-10-CM | POA: Diagnosis not present

## 2021-11-06 DIAGNOSIS — R0600 Dyspnea, unspecified: Secondary | ICD-10-CM

## 2021-11-06 DIAGNOSIS — R0609 Other forms of dyspnea: Secondary | ICD-10-CM | POA: Diagnosis not present

## 2021-11-06 NOTE — Progress Notes (Deleted)
Incomplete Session Note  Patient Details  Name: BARRETT HOLTHAUS MRN: [id number] Date of Birth: 23-Nov-1965 Referring Provider:   Flowsheet Row Pulmonary Rehab from 08/17/2021 in [facility] Cardiac and Pulmonary Rehab  Referring Provider Julien Nordmann MD       Leodis Sias Mcgaha did not complete her rehab session.  Antoinetta returns after a couple weeks out. After working with the T4, today she started feeling flushed. HR 106 BP in normal range. She decided to stop and complete her session for the day.  She was not here long enough for a session to be charged.

## 2021-11-06 NOTE — Progress Notes (Deleted)
Daily Session Note  Patient Details  Name: Brandy [name] MRN: [id number] Date of Birth: [birth year] Referring Provider:   Flowsheet Row Pulmonary Rehab from 08/17/2021 in [facility] Cardiac and Pulmonary Rehab  Referring Provider Ida Rogue MD       Encounter Date: 11/06/2021  Check In:  Session Check In - 11/06/21 1133       Check-In   Supervising physician immediately available to respond to emergencies See telemetry face sheet for immediately available ER MD    Location [facility]-Cardiac & Pulmonary Rehab    Staff Present Heath Lark, RN, BSN, CCRP;Joseph [place], RCP,RRT,BSRT;Kelly [place], [place], ACSM CEP, Exercise Physiologist    Virtual Visit No    Medication changes reported     No    Fall or balance concerns reported    No    Warm-up and Cool-down Performed on first and last piece of equipment    Resistance Training Performed Yes    VAD Patient? No    PAD/SET Patient? No      Pain Assessment   Currently in Pain? No/denies                Social History   Tobacco Use  Smoking Status Never  Smokeless Tobacco Never    Goals Met:  Proper associated with RPD/PD & O2 Sat Independence with exercise equipment Exercise tolerated well No report of concerns or symptoms today  Goals Unmet:  Not Applicable  Comments: Pt able to follow exercise prescription today without complaint.  Will continue to monitor for progression.    Dr. Emily Filbert is Medical Director for [place].  Dr. Ottie Glazier is Medical Director for [facility] Pulmonary Rehabilitation.

## 2021-11-06 NOTE — Progress Notes (Addendum)
Daily Session Note  Patient Details  Name: Brandy [name] MRN: [id number] Date of Birth: 09-01-1965 Referring Provider:   Flowsheet Row Pulmonary Rehab from 08/17/2021 in [facility] Cardiac and Pulmonary Rehab  Referring Provider Ida Rogue MD       Encounter Date: 11/06/2021  Check In:  Session Check In - 11/06/21 1133       Check-In   Supervising physician immediately available to respond to emergencies See telemetry face sheet for immediately available ER MD    Location [facility]-Cardiac & Pulmonary Rehab    Staff Present Heath Lark, RN, BSN, CCRP;Joseph [place], RCP,RRT,BSRT;Kelly [place], [place], ACSM CEP, Exercise Physiologist    Virtual Visit No    Medication changes reported     No    Fall or balance concerns reported    No    Warm-up and Cool-down Performed on first and last piece of equipment    Resistance Training Performed Yes    VAD Patient? No    PAD/SET Patient? No      Pain Assessment   Currently in Pain? No/denies                Social History   Tobacco Use  Smoking Status Never  Smokeless Tobacco Never    Goals Met:  Proper associated with RPD/PD & O2 Sat Independence with exercise equipment Exercise tolerated well No report of concerns or symptoms today  Goals Unmet:  Not Applicable  Comments: Pt able to follow exercise prescription today without complaint.  Will continue to monitor for progression.  Error charting the incomplete note.    Dr. Emily Filbert is Medical Director for [facility].  Dr. Ottie Glazier is Medical Director for [facility] Pulmonary Rehabilitation.

## 2021-11-08 ENCOUNTER — Ambulatory Visit: Payer: BC Managed Care – PPO | Admitting: Primary Care

## 2021-11-08 ENCOUNTER — Encounter: Payer: Self-pay | Admitting: *Deleted

## 2021-11-08 DIAGNOSIS — R0600 Dyspnea, unspecified: Secondary | ICD-10-CM

## 2021-11-08 NOTE — Progress Notes (Signed)
Pulmonary Individual Treatment Plan  Patient Details  Name: Brandy [name] MRN: [id number] Date of Birth: Jul 19, 1965 Referring Provider:   Flowsheet Row Pulmonary Rehab from 08/17/2021 in [facility] Cardiac and Pulmonary Rehab  Referring Provider Ida Rogue MD       Initial Encounter Date:  Flowsheet Row Pulmonary Rehab from 08/17/2021 in [facility] Cardiac and Pulmonary Rehab  Date 08/17/21       Visit Diagnosis: Dyspnea, unspecified type  Patient's Home Medications on Admission:  Current Outpatient Medications:    acetaminophen (TYLENOL) 650 MG CR tablet, Take 1,300 mg by mouth every 8 (eight) hours as needed for pain., Disp: , Rfl:    atorvastatin (LIPITOR) 10 MG tablet, TAKE 1 TABLET BY MOUTH EVERY DAY FOR CHOLESTEROL, Disp: 90 tablet, Rfl: 2   cetirizine (ZYRTEC) 10 MG tablet, TAKE 1 TABLET (10 MG TOTAL) BY MOUTH DAILY. FOR ALLERGIES, Disp: 90 tablet, Rfl: 1   citalopram (CELEXA) 40 MG tablet, Take 40 mg by mouth daily., Disp: , Rfl:    DULoxetine (CYMBALTA) 30 MG capsule, Take 30 mg by mouth daily. Take with 60 mg to equal 90 mg daily, Disp: , Rfl:    DULoxetine (CYMBALTA) 60 MG capsule, Take 60 mg by mouth daily. Take with 30 mg to equal 90 mg daily, Disp: , Rfl:    folic acid (FOLVITE) 1 MG tablet, Take 1 mg by mouth daily., Disp: , Rfl:    gabapentin (NEURONTIN) 300 MG capsule, TAKE 1 CAPSULE BY MOUTH 3 TIMES A DAY AS NEEDED FOR BACK PAIN, Disp: 270 capsule, Rfl: 3   lamoTRIgine (LAMICTAL) 100 MG tablet, Take 100 mg by mouth every evening., Disp: , Rfl:    lamoTRIgine (LAMICTAL) 200 MG tablet, Take 200 mg by mouth in the morning., Disp: , Rfl:    lisinopril (ZESTRIL) 30 MG tablet, TAKE 1 TABLET (30 MG TOTAL) BY MOUTH DAILY. FOR BLOOD PRESSURE, Disp: 90 tablet, Rfl: 2   metFORMIN (GLUCOPHAGE-XR) 500 MG 24 hr tablet, TAKE 2 TABLETS (1,000 MG TOTAL) BY MOUTH DAILY WITH BREAKFAST. FOR DIABETES., Disp: 180 tablet, Rfl: 3   metroNIDAZOLE (METROCREAM) 0.75 % cream, Apply 1 application  topically daily as needed (rosacea)., Disp: , Rfl:    Multiple Minerals-Vitamins (CALCIUM-MAGNESIUM-ZINC-D3) TABS, Take 2 tablets by mouth daily., Disp: , Rfl:    ondansetron (ZOFRAN-ODT) 4 MG disintegrating tablet, Dissolve 1 tablet (4 mg total) by mouth every 6 (six) hours as needed for nausea or vomiting., Disp: 20 tablet, Rfl: 0   pantoprazole (PROTONIX) 40 MG tablet, Take 1 tablet (40 mg total) by mouth daily., Disp: 90 tablet, Rfl: 0   sucralfate (CARAFATE) 1 g tablet, Take by mouth., Disp: , Rfl:    traZODone (DESYREL) 50 MG tablet, Take 50 mg by mouth at bedtime as needed for sleep., Disp: , Rfl:    Vitamin D, Ergocalciferol, (DRISDOL) 1.25 MG (50000 UNIT) CAPS capsule, TAKE 1 CAPSULE BY MOUTH ONCE WEEKLY FOR VITAMIN D., Disp: 12 capsule, Rfl: 1  Past Medical History: Past Medical History:  Diagnosis Date   Anxiety    Arthritis    Back pain    Brain fog    CHF (congestive heart failure) (HCC)    pt not aware of this   Depression    Diabetes mellitus without complication (HCC)    Difficulty walking    Dysmetabolic syndrome X    Fatigue    Fibromyalgia    neuropathy   Hypertension    Hypertriglyceridemia    Joint pain  Leg weakness    Lower extremity edema    Muscle atrophy    Neuropathy    Obesity    Osteoarthritis    PONV (postoperative nausea and vomiting)    Poor memory    Post-menopausal bleeding    Prediabetes    Rosacea    Sleep apnea    not on cpap yet   Tetanus vaccine causing adverse effect in therapeutic use    Vitamin D deficiency     Tobacco Use: Social History   Tobacco Use  Smoking Status Never  Smokeless Tobacco Never    Labs: Review Flowsheet  More data exists      Latest Ref Rng & Units 02/08/2020 06/01/2020 10/04/2020 03/20/2021 06/20/2021  Labs for ITP Cardiac and Pulmonary Rehab  Cholestrol 0 - 200 mg/dL - 137  - - 166   LDL (calc) 0 - 99 mg/dL - 49  - - 92   HDL-C >39.00 mg/dL - 57.30  - - 40.10   Trlycerides 0.0 - 149.0 mg/dL -  152.0  - - 169.0   Hemoglobin A1c 4.6 - 6.5 % 7.3  7.0  8.6  7.9  7.0      Pulmonary Assessment Scores:  Pulmonary Assessment Scores     Row Name 08/17/21 1703         ADL UCSD   ADL Phase Entry     SOB Score total 85     Rest 0     Walk 3     Stairs 5     Bath 3     Dress 2     Shop 5       CAT Score   CAT Score 13       mMRC Score   mMRC Score 2              UCSD: Self-administered rating of dyspnea associated with activities of daily living (ADLs) 6-point scale (0 = "not at all" to 5 = "maximal or unable to do because of breathlessness")  Scoring Scores range from 0 to 120.  Minimally important difference is 5 units  CAT: CAT can identify the health impairment of COPD patients and is better correlated with disease progression.  CAT has a scoring range of zero to 40. The CAT score is classified into four groups of low (less than 10), medium (10 - 20), high (21-30) and very high (31-40) based on the impact level of disease on health status. A CAT score over 10 suggests significant symptoms.  A worsening CAT score could be explained by an exacerbation, poor medication adherence, poor inhaler technique, or progression of COPD or comorbid conditions.  CAT MCID is 2 points  mMRC: mMRC (Modified Medical Research Council) Dyspnea Scale is used to assess the degree of baseline functional disability in patients of respiratory disease due to dyspnea. No minimal important difference is established. A decrease in score of 1 point or greater is considered a positive change.   Pulmonary Function Assessment:  Pulmonary Function Assessment - 08/14/21 1123       Breath   Shortness of Breath Limiting activity;Yes             Exercise Target Goals: Exercise Program Goal: Individual exercise prescription set using results from initial 6 min walk test and THRR while considering  patient's activity barriers and safety.   Exercise Prescription Goal: Initial exercise  prescription builds to 30-45 minutes a day of aerobic activity, 2-3 days per week.  Home exercise  guidelines will be given to patient during program as part of exercise prescription that the participant will acknowledge.  Education: Aerobic Exercise: - Group verbal and visual presentation on the components of exercise prescription. Introduces F.I.T.T principle from ACSM for exercise prescriptions.  Reviews F.I.T.T. principles of aerobic exercise including progression. Written material given at graduation. Flowsheet Row Pulmonary Rehab from 08/17/2021 in [facility] Cardiac and Pulmonary Rehab  Education need identified 08/17/21       Education: Resistance Exercise: - Group verbal and visual presentation on the components of exercise prescription. Introduces F.I.T.T principle from ACSM for exercise prescriptions  Reviews F.I.T.T. principles of resistance exercise including progression. Written material given at graduation.    Education: Exercise & Equipment Safety: - Individual verbal instruction and demonstration of equipment use and safety with use of the equipment. Flowsheet Row Pulmonary Rehab from 08/14/2021 in [facility] Cardiac and Pulmonary Rehab  Date 08/14/21  Educator [facility]  Instruction Review Code 1- Verbalizes Understanding       Education: Exercise Physiology & General Exercise Guidelines: - Group verbal and written instruction with models to review the exercise physiology of the cardiovascular system and associated critical values. Provides general exercise guidelines with specific guidelines to those with heart or lung disease.  Flowsheet Row Pulmonary Rehab from 08/17/2021 in [facility] Cardiac and Pulmonary Rehab  Education need identified 08/17/21       Education: Flexibility, Balance, Mind/Body Relaxation: - Group verbal and visual presentation with interactive activity on the components of exercise prescription. Introduces F.I.T.T principle from ACSM for exercise prescriptions. Reviews  F.I.T.T. principles of flexibility and balance exercise training including progression. Also discusses the mind body connection.  Reviews various relaxation techniques to help reduce and manage stress (i.e. Deep breathing, progressive muscle relaxation, and visualization). Balance handout provided to take home. Written material given at graduation.   Activity Barriers & Risk Stratification:  Activity Barriers & Cardiac Risk Stratification - 08/17/21 1717       Activity Barriers & Cardiac Risk Stratification   Activity Barriers Deconditioning;Muscular Weakness;Shortness of Breath;Fibromyalgia;Joint Problems             6 Minute Walk:  6 Minute Walk     Row Name 08/17/21 1715         6 Minute Walk   Phase Initial     Distance 765 feet     Walk Time 6 minutes     # of Rest Breaks 0     MPH 1.44     METS 2.17     RPE 13     Perceived Dyspnea  2     VO2 Peak 7.6     Symptoms Yes (comment)     Comments SOB     Resting HR 65 bpm     Resting BP 140/70     Resting Oxygen Saturation  96 %     Exercise Oxygen Saturation  during 6 min walk 96 %     Max Ex. HR 140 bpm     Max Ex. BP 154/78     2 Minute Post BP 138/78       Interval HR   1 Minute HR 98     2 Minute HR 115     3 Minute HR 129     4 Minute HR 137     5 Minute HR 139     6 Minute HR 140     2 Minute Post HR 75     Interval Heart Rate? Yes  Interval Oxygen   Interval Oxygen? Yes     Baseline Oxygen Saturation % 96 %     1 Minute Oxygen Saturation % 97 %     1 Minute Liters of Oxygen 0 L  RA     2 Minute Oxygen Saturation % 99 %     2 Minute Liters of Oxygen 0 L     3 Minute Oxygen Saturation % 98 %     3 Minute Liters of Oxygen 0 L     4 Minute Oxygen Saturation % 98 %     4 Minute Liters of Oxygen 0 L     5 Minute Oxygen Saturation % 99 %     5 Minute Liters of Oxygen 0 L     6 Minute Oxygen Saturation % 98 %     6 Minute Liters of Oxygen 0 L     2 Minute Post Oxygen Saturation % 97 %     2  Minute Post Liters of Oxygen 0 L             Oxygen Initial Assessment:  Oxygen Initial Assessment - 08/17/21 1702       Home Oxygen   Home Oxygen Device None    Sleep Oxygen Prescription None    Home Exercise Oxygen Prescription None    Home Resting Oxygen Prescription None    Compliance with Home Oxygen Use Yes      Initial 6 min Walk   Oxygen Used None      Program Oxygen Prescription   Program Oxygen Prescription None      Intervention   Short Term Goals To learn and understand importance of monitoring SPO2 with pulse oximeter and demonstrate accurate use of the pulse oximeter.;To learn and understand importance of maintaining oxygen saturations>88%;To learn and demonstrate proper pursed lip breathing techniques or other breathing techniques.     Long  Term Goals Verbalizes importance of monitoring SPO2 with pulse oximeter and return demonstration;Maintenance of O2 saturations>88%;Exhibits proper breathing techniques, such as pursed lip breathing or other method taught during program session;Compliance with respiratory medication             Oxygen Re-Evaluation:  Oxygen Re-Evaluation     Row Name 08/21/21 1018 10/02/21 1155 10/27/21 1122         Program Oxygen Prescription   Program Oxygen Prescription -- None None       Home Oxygen   Home Oxygen Device -- None None     Sleep Oxygen Prescription -- None None     Home Exercise Oxygen Prescription -- None None     Home Resting Oxygen Prescription -- None None     Compliance with Home Oxygen Use -- Yes Yes       Goals/Expected Outcomes   Short Term Goals -- To learn and understand importance of monitoring SPO2 with pulse oximeter and demonstrate accurate use of the pulse oximeter.;To learn and understand importance of maintaining oxygen saturations>88%;To learn and demonstrate proper pursed lip breathing techniques or other breathing techniques.  To learn and understand importance of monitoring SPO2 with pulse  oximeter and demonstrate accurate use of the pulse oximeter.;To learn and understand importance of maintaining oxygen saturations>88%;To learn and demonstrate proper pursed lip breathing techniques or other breathing techniques.      Long  Term Goals -- Verbalizes importance of monitoring SPO2 with pulse oximeter and return demonstration;Maintenance of O2 saturations>88%;Exhibits proper breathing techniques, such as pursed lip breathing or other method taught  during program session;Compliance with respiratory medication Verbalizes importance of monitoring SPO2 with pulse oximeter and return demonstration;Maintenance of O2 saturations>88%;Exhibits proper breathing techniques, such as pursed lip breathing or other method taught during program session;Compliance with respiratory medication     Comments Reviewed PLB technique with pt.  Talked about how it works and it's importance in maintaining their exercise saturations. Discussed purchasing a pulse ox to monitor her heart rate and oxygen. She is checking her HR via her BP cuff, but not during exercise. She's walking to rehab without any assistance, but wants to work on balance exercises. Anyae is doing well in rehab. She is still getting winded walking and doing dishes.  She is good about using her PLB to help with exercise and especially with weights. She still has not gotten a pulse oximeter but just got paid so she will get one soon.     Goals/Expected Outcomes Short: Become more profiecient at using PLB.   Long: Become independent at using PLB. ST: purchase a pulse ox. LT: continue to monitor HR and oxygen at home independently Short: Continue work on PLB with exercise Long: COnintue to manage breathing.              Oxygen Discharge (Final Oxygen Re-Evaluation):  Oxygen Re-Evaluation - 10/27/21 1122       Program Oxygen Prescription   Program Oxygen Prescription None      Home Oxygen   Home Oxygen Device None    Sleep Oxygen Prescription None     Home Exercise Oxygen Prescription None    Home Resting Oxygen Prescription None    Compliance with Home Oxygen Use Yes      Goals/Expected Outcomes   Short Term Goals To learn and understand importance of monitoring SPO2 with pulse oximeter and demonstrate accurate use of the pulse oximeter.;To learn and understand importance of maintaining oxygen saturations>88%;To learn and demonstrate proper pursed lip breathing techniques or other breathing techniques.     Long  Term Goals Verbalizes importance of monitoring SPO2 with pulse oximeter and return demonstration;Maintenance of O2 saturations>88%;Exhibits proper breathing techniques, such as pursed lip breathing or other method taught during program session;Compliance with respiratory medication    Comments Bobbyjo is doing well in rehab. She is still getting winded walking and doing dishes.  She is good about using her PLB to help with exercise and especially with weights. She still has not gotten a pulse oximeter but just got paid so she will get one soon.    Goals/Expected Outcomes Short: Continue work on PLB with exercise Long: COnintue to manage breathing.             Initial Exercise Prescription:  Initial Exercise Prescription - 08/17/21 1700       Date of Initial Exercise RX and Referring Provider   Date 08/17/21    Referring Provider Ida Rogue MD      Oxygen   Maintain Oxygen Saturation 88% or higher      NuStep   Level 1    SPM 80    Minutes 15    METs 1.4      REL-XR   Level 1    Speed 50    Minutes 15    METs 1.4      T5 Nustep   Level 1    SPM 80    Minutes 15    METs 1.4      Track   Laps 7    Minutes 15  METs 1.38      Prescription Details   Frequency (times per week) 3    Duration Progress to 30 minutes of continuous aerobic without signs/symptoms of physical distress      Intensity   THRR 40-80% of Max Heartrate 103 - 142    Ratings of Perceived Exertion 11-13    Perceived Dyspnea 0-4       Progression   Progression Continue to progress workloads to maintain intensity without signs/symptoms of physical distress.      Resistance Training   Training Prescription Yes    Weight 4 lb    Reps 10-15             Perform Capillary Blood Glucose checks as needed.  Exercise Prescription Changes:   Exercise Prescription Changes     Row Name 08/17/21 1700 09/05/21 1100 09/21/21 1400 10/03/21 0800 10/17/21 1600     Response to Exercise   Blood Pressure (Admit) 140/70 144/98 130/70 134/64 122/76   Blood Pressure (Exercise) 154/78 130/70 -- 142/76 --   Blood Pressure (Exit) 138/78 126/70 124/78 124/64 118/64   Heart Rate (Admit) 65 bpm 76 bpm 99 bpm 96 bpm 92 bpm   Heart Rate (Exercise) 140 bpm 102 bpm 133 bpm 110 bpm 110 bpm   Heart Rate (Exit) 68 bpm 99 bpm 94 bpm 93 bpm 96 bpm   Oxygen Saturation (Admit) 96 % 96 % 96 % 97 % 96 %   Oxygen Saturation (Exercise) 98 % 95 % 97 % 93 % 93 %   Oxygen Saturation (Exit) 97 % 96 % 96 % 96 % 95 %   Rating of Perceived Exertion (Exercise) 13 13 13 13 13    Perceived Dyspnea (Exercise) 2 1 1  -- 1   Symptoms SOB SOB none none none   Comments walk test results -- -- -- --   Duration -- Continue with 30 min of aerobic exercise without signs/symptoms of physical distress. Continue with 30 min of aerobic exercise without signs/symptoms of physical distress. Continue with 30 min of aerobic exercise without signs/symptoms of physical distress. Continue with 30 min of aerobic exercise without signs/symptoms of physical distress.   Intensity -- THRR unchanged THRR unchanged THRR unchanged THRR unchanged     Progression   Progression -- Continue to progress workloads to maintain intensity without signs/symptoms of physical distress. Continue to progress workloads to maintain intensity without signs/symptoms of physical distress. Continue to progress workloads to maintain intensity without signs/symptoms of physical distress. Continue to  progress workloads to maintain intensity without signs/symptoms of physical distress.   Average METs -- 1.45 1.58 1.54 1.92     Resistance Training   Training Prescription -- Yes Yes Yes Yes   Weight -- 4 lb 4 lb 4 lb 4 lb   Reps -- 10-15 10-15 10-15 10-15     Interval Training   Interval Training -- No No No No     Treadmill   MPH -- -- 1.1 1.2 --   Grade -- -- 0 0 --   Minutes -- -- 15 15 --   METs -- -- 1.8 1.92 --     Arm Ergometer   Level -- 1 -- -- --   Minutes -- 15 -- -- --   METs -- 1.7 -- -- --     REL-XR   Level -- 1 3 3 2    Minutes -- 15 15 15 15    METs -- 1.2 1.2 1.1 --     T5  Nustep   Level -- -- 1 -- 2   Minutes -- -- 15 -- 30   METs -- -- 1.5 -- 2     Track   Laps -- -- 15 11 --   Minutes -- -- 15 15 --   METs -- -- 1.76 1.6 --     Home Exercise Plan   Plans to continue exercise at -- -- -- -- Home (comment)  walking; baseball field   Frequency -- -- -- -- Add 2 additional days to program exercise sessions.   Initial Home Exercises Provided -- -- -- -- 10/02/21     Oxygen   Maintain Oxygen Saturation -- 88% or higher 88% or higher 88% or higher 88% or higher    Row Name 10/30/21 1500             Response to Exercise   Blood Pressure (Admit) 124/72       Blood Pressure (Exit) 136/70       Heart Rate (Admit) 99 bpm       Heart Rate (Exercise) 110 bpm       Heart Rate (Exit) 103 bpm       Oxygen Saturation (Admit) 95 %       Oxygen Saturation (Exercise) 94 %       Oxygen Saturation (Exit) 97 %       Rating of Perceived Exertion (Exercise) 13       Perceived Dyspnea (Exercise) 1       Symptoms none       Duration Continue with 30 min of aerobic exercise without signs/symptoms of physical distress.       Intensity THRR unchanged         Progression   Progression Continue to progress workloads to maintain intensity without signs/symptoms of physical distress.       Average METs 2         Resistance Training   Training Prescription Yes        Weight 4 lb       Reps 10-15         Interval Training   Interval Training No         REL-XR   Level 2       Minutes 15       METs 1.3         T5 Nustep   Level 3       Minutes 30       METs 2.6         Home Exercise Plan   Plans to continue exercise at Home (comment)  walking; baseball field       Frequency Add 2 additional days to program exercise sessions.       Initial Home Exercises Provided 10/02/21         Oxygen   Maintain Oxygen Saturation 88% or higher                Exercise Comments:   Exercise Goals and Review:   Exercise Goals     Row Name 08/17/21 1719             Exercise Goals   Increase Physical Activity Yes       Intervention Provide advice, education, support and counseling about physical activity/exercise needs.;Develop an individualized exercise prescription for aerobic and resistive training based on initial evaluation findings, risk stratification, comorbidities and participant's personal goals.       Expected Outcomes Short Term: Attend  rehab on a regular basis to increase amount of physical activity.;Long Term: Add in home exercise to make exercise part of routine and to increase amount of physical activity.;Long Term: Exercising regularly at least 3-5 days a week.       Increase Strength and Stamina Yes       Intervention Provide advice, education, support and counseling about physical activity/exercise needs.;Develop an individualized exercise prescription for aerobic and resistive training based on initial evaluation findings, risk stratification, comorbidities and participant's personal goals.       Expected Outcomes Short Term: Increase workloads from initial exercise prescription for resistance, speed, and METs.;Short Term: Perform resistance training exercises routinely during rehab and add in resistance training at home;Long Term: Improve cardiorespiratory fitness, muscular endurance and strength as measured by increased METs and  functional capacity (6MWT)       Able to understand and use rate of perceived exertion (RPE) scale Yes       Intervention Provide education and explanation on how to use RPE scale       Expected Outcomes Short Term: Able to use RPE daily in rehab to express subjective intensity level;Long Term:  Able to use RPE to guide intensity level when exercising independently       Able to understand and use Dyspnea scale Yes       Intervention Provide education and explanation on how to use Dyspnea scale       Expected Outcomes Short Term: Able to use Dyspnea scale daily in rehab to express subjective sense of shortness of breath during exertion;Long Term: Able to use Dyspnea scale to guide intensity level when exercising independently       Knowledge and understanding of Target Heart Rate Range (THRR) Yes       Intervention Provide education and explanation of THRR including how the numbers were predicted and where they are located for reference       Expected Outcomes Short Term: Able to state/look up THRR;Short Term: Able to use daily as guideline for intensity in rehab;Long Term: Able to use THRR to govern intensity when exercising independently       Able to check pulse independently Yes       Intervention Provide education and demonstration on how to check pulse in carotid and radial arteries.;Review the importance of being able to check your own pulse for safety during independent exercise       Expected Outcomes Short Term: Able to explain why pulse checking is important during independent exercise;Long Term: Able to check pulse independently and accurately       Understanding of Exercise Prescription Yes       Intervention Provide education, explanation, and written materials on patient's individual exercise prescription       Expected Outcomes Short Term: Able to explain program exercise prescription;Long Term: Able to explain home exercise prescription to exercise independently                 Exercise Goals Re-Evaluation :  Exercise Goals Re-Evaluation     Row Name 08/21/21 1015 09/05/21 1129 09/11/21 1151 09/21/21 1419 10/02/21 1110     Exercise Goal Re-Evaluation   Exercise Goals Review Increase Physical Activity;Able to understand and use rate of perceived exertion (RPE) scale;Knowledge and understanding of Target Heart Rate Range (THRR);Understanding of Exercise Prescription;Increase Strength and Stamina;Able to check pulse independently;Able to understand and use Dyspnea scale Increase Physical Activity;Increase Strength and Stamina;Understanding of Exercise Prescription Increase Physical Activity;Increase Strength and Stamina;Understanding of Exercise  Prescription Increase Physical Activity;Increase Strength and Stamina;Understanding of Exercise Prescription Increase Physical Activity;Increase Strength and Stamina;Understanding of Exercise Prescription   Comments Reviewed RPE and dyspnea scales, THR and program prescription with pt today.  Pt voiced understanding and was given a copy of goals to take home. Shawntia is off to a good start in rehab.  She has completed her first four days of exercise thus far.  She is now going for the 30 minutes.  We will continue to monitor her progress. Timara is walking at home around a track for ~1 hour with her husband. She has not gone over home exercise with EP. Viha is doing well in rehab. She increased her laps on the track from 7 to 15 in total. She also moved up to level 3 on the XR. She tried out the treadmill for the first time at 1.1 mph speed which about the same speed as her track. We hope to see that improve over time. Will continue to monitor. Marcene Brawn, Bon[place], reviewed home exercise with pt today.  Pt is walking 30-45 min at a baseball field and breaking during laps. Pt is walking 5-6 days per week. Pt plans to continue walking at the baseball field for exercise.  Reviewed THR, pulse, RPE, sign and symptoms, pulse oximetery and when to call 911 or  MD.  Also discussed weather considerations and indoor options.  Pt voiced understanding. Pt requested explanation of METs and assistance with balance exercise; balance printout and staff youtube video information provided.   Expected Outcomes Short: Use RPE daily to regulate intensity. Long: Follow program prescription in THR. Short: Continue to attend rehab regularly Long: Continue to follow program prescription ST: EP to go over home exercise LT: Continue to follow program prescription Short: Increase speed on treadmill Long: Continue to increase overall MET level Short: minimize breaks during walking exercise Long: continue exercising independently at home    Row Name 10/03/21 0832 10/17/21 1656 10/27/21 1106 10/30/21 1519       Exercise Goal Re-Evaluation   Exercise Goals Review Increase Physical Activity;Increase Strength and Stamina;Understanding of Exercise Prescription Increase Physical Activity;Increase Strength and Stamina;Understanding of Exercise Prescription Increase Physical Activity;Increase Strength and Stamina;Understanding of Exercise Prescription Increase Physical Activity;Increase Strength and Stamina;Understanding of Exercise Prescription    Comments Roselynne is doing well in rehab. She increased her speed on the treadmill to 1.2 mph. She has also tolerated using 4 lb hand weights for resistance training. She could benefit from pushing it to do more laps on the track. We will continue to monitor her progress in the program. Yeny continues to do well in rehab. She did not walk at all since last review. She was able to increase to level 2 on the T5 Nustep. Her level on the XR is inconsistent and would  benefit from bringing that back up.  Oxygen is staying above 88%. Will continue to monitor. Allaya is doing well in rehab.  She has been stretching and PT exercises in addition to what we have been doing in class.  She is walking at the baseball field 90 ft at a time. She is getting more strength  back, but not feeling like her stamina is getting better.  She wants to go harder, but limited by walking.  She is looking into [employer] so that she can use some seated equipment.  We talked about picking the one that she is more likely to go to.  When knows that when she feels progress,  she is more motivated to go. We also talked about using the staff videos on bad weather days. Loriis doing well in rehab. She recently improved to level 3 on the T5 machine. She also improved her overall average METs to 2 METs. She has also tolerated 4 lb hand weights for resistance training. We will continue to monitor her progress in the program.    Expected Outcomes Short: Continue to push for more laps on the track. Long: Continue to increase stamina. Short: Increase XR level back up and get back to walking Long: Continue to increase overall MET level Short: Continue to walk on off days Long: Conitnue to improve stamina Short: Continue to follow exercise prescription. Long: Conitnue to improve strength and stamina.             Discharge Exercise Prescription (Final Exercise Prescription Changes):  Exercise Prescription Changes - 10/30/21 1500       Response to Exercise   Blood Pressure (Admit) 124/72    Blood Pressure (Exit) 136/70    Heart Rate (Admit) 99 bpm    Heart Rate (Exercise) 110 bpm    Heart Rate (Exit) 103 bpm    Oxygen Saturation (Admit) 95 %    Oxygen Saturation (Exercise) 94 %    Oxygen Saturation (Exit) 97 %    Rating of Perceived Exertion (Exercise) 13    Perceived Dyspnea (Exercise) 1    Symptoms none    Duration Continue with 30 min of aerobic exercise without signs/symptoms of physical distress.    Intensity THRR unchanged      Progression   Progression Continue to progress workloads to maintain intensity without signs/symptoms of physical distress.    Average METs 2      Resistance Training   Training Prescription Yes    Weight 4 lb    Reps 10-15       Interval Training   Interval Training No      REL-XR   Level 2    Minutes 15    METs 1.3      T5 Nustep   Level 3    Minutes 30    METs 2.6      Home Exercise Plan   Plans to continue exercise at Home (comment)   walking; baseball field   Frequency Add 2 additional days to program exercise sessions.    Initial Home Exercises Provided 10/02/21      Oxygen   Maintain Oxygen Saturation 88% or higher             Nutrition:  Target Goals: Understanding of nutrition guidelines, daily intake of sodium '1500mg'$ , cholesterol '200mg'$ , calories 30% from fat and 7% or less from saturated fats, daily to have 5 or more servings of fruits and vegetables.  Education: All About Nutrition: -Group instruction provided by verbal, written material, interactive activities, discussions, models, and posters to present general guidelines for heart healthy nutrition including fat, fiber, MyPlate, the role of sodium in heart healthy nutrition, utilization of the nutrition label, and utilization of this knowledge for meal planning. Follow up email sent as well. Written material given at graduation.   Biometrics:  Pre Biometrics - 08/17/21 1716       Pre Biometrics   Height 5' 7.25" (1.708 m)    Weight 295 lb 11.2 oz (134.1 kg)    BMI (Calculated) 45.98    Single Leg Stand 2.3 seconds              Nutrition Therapy Plan  and Nutrition Goals:  Nutrition Therapy & Goals - 09/11/21 1135       Nutrition Therapy   RD appointment deferred Yes   Seeing RD s/p bariatric surgery - she has her 6 month appointment soon. Will defer nutrition until 6 month bariatric surgery RD appointment.     Personal Nutrition Goals   Nutrition Goal Seeing RD s/p bariatric surgery - she has her 6 month appointment soon. Will defer nutrition until 6 month bariatric surgery RD appointment.             Nutrition Assessments:  MEDIFICTS Score Key: ?70 Need to make dietary changes  40-70 Heart Healthy Diet ?  40 Therapeutic Level Cholesterol Diet  Flowsheet Row Pulmonary Rehab from 08/17/2021 in [facility] Cardiac and Pulmonary Rehab  Picture Your Plate Total Score on Admission 64      Picture Your Plate Scores: <91 Unhealthy dietary pattern with much room for improvement. 41-50 Dietary pattern unlikely to meet recommendations for good health and room for improvement. 51-60 More healthful dietary pattern, with some room for improvement.  >60 Healthy dietary pattern, although there may be some specific behaviors that could be improved.   Nutrition Goals Re-Evaluation:  Nutrition Goals Re-Evaluation     Row Name 10/02/21 1139 10/27/21 1117           Goals   Current Weight 262 lb (118.8 kg) --      Nutrition Goal Continues to defer RD appointment. Continues to defer RD appointment.      Comment Kearie states she is working with her bariatric doctor's office for her nutritional needs. She currently defers a Art therapist. Geet is doing well with her diet.  She does feel fatigued when she eats and will talk to dietitian about this.      Expected Outcome ST: continue meeting with bariatric diet plan LT: weigh less than 200 lbs. ST: continue meeting with bariatric diet plan LT: weigh less than 200 lbs.               Nutrition Goals Discharge (Final Nutrition Goals Re-Evaluation):  Nutrition Goals Re-Evaluation - 10/27/21 1117       Goals   Nutrition Goal Continues to defer RD appointment.    Comment Ivoree is doing well with her diet.  She does feel fatigued when she eats and will talk to dietitian about this.    Expected Outcome ST: continue meeting with bariatric diet plan LT: weigh less than 200 lbs.             Psychosocial: Target Goals: Acknowledge presence or absence of significant depression and/or stress, maximize coping skills, provide positive support system. Participant is able to verbalize types and ability to use techniques and skills needed for reducing stress and  depression.   Education: Stress, Anxiety, and Depression - Group verbal and visual presentation to define topics covered.  Reviews how body is impacted by stress, anxiety, and depression.  Also discusses healthy ways to reduce stress and to treat/manage anxiety and depression.  Written material given at graduation.   Education: Sleep Hygiene -Provides group verbal and written instruction about how sleep can affect your health.  Define sleep hygiene, discuss sleep cycles and impact of sleep habits. Review good sleep hygiene tips.    Initial Review & Psychosocial Screening:  Initial Psych Review & Screening - 10/02/21 1133       Initial Review   Current issues with Current Depression;Current Psychotropic Meds;History of Depression;Current Stress Concerns;Current Sleep Concerns  Quality of Life Scores:  Scores of 19 and below usually indicate a poorer quality of life in these areas.  A difference of  2-3 points is a clinically meaningful difference.  A difference of 2-3 points in the total score of the Quality of Life Index has been associated with significant improvement in overall quality of life, self-image, physical symptoms, and general health in studies assessing change in quality of life.  PHQ-9: Review Flowsheet  More data exists      10/27/2021 10/13/2021 09/11/2021 08/17/2021 06/01/2021  Depression screen PHQ 2/9  Decreased Interest 1 1 1 1    Down, Depressed, Hopeless 1 1 1 1    PHQ - 2 Score 2 2 2 2    Altered sleeping 2 3 3  0   Tired, decreased energy 1 2 2 1    Change in appetite 0 0 0 0   Feeling bad or failure about yourself  3 3 2 2    Trouble concentrating 3 3 2 2    Moving slowly or fidgety/restless 2 3 1  0   Suicidal thoughts 0 - 0 0   PHQ-9 Score 13 16 12 7    Difficult doing work/chores Somewhat difficult Somewhat difficult Somewhat difficult Somewhat difficult -    Details       Information is confidential and restricted. Go to Review Flowsheets to  unlock data.        Interpretation of Total Score  Total Score Depression Severity:  1-4 = Minimal depression, 5-9 = Mild depression, 10-14 = Moderate depression, 15-19 = Moderately severe depression, 20-27 = Severe depression   Psychosocial Evaluation and Intervention:  Psychosocial Evaluation - 08/14/21 1129       Psychosocial Evaluation & Interventions   Interventions Stress management education;Encouraged to exercise with the program and follow exercise prescription    Comments She is out of work with a sever autistic son. She is generally negative and is trying to be more positive. She talks to a therapist and takes medsication to help her cope better. She is determined to exercise, get weight off and be more positive.    Expected Outcomes Short: Start [facility] to help with mood. Long: Maintain a healthy mental state.    Continue Psychosocial Services  Follow up required by staff             Psychosocial Re-Evaluation:  Psychosocial Re-Evaluation     Jena Name 09/11/21 1137 10/02/21 1133 10/27/21 1112         Psychosocial Re-Evaluation   Current issues with Current Depression;Current Anxiety/Panic;Current Sleep Concerns;Current Psychotropic Meds Current Depression;Current Anxiety/Panic;Current Sleep Concerns;Current Psychotropic Meds;Current Stress Concerns Current Depression;Current Anxiety/Panic;Current Sleep Concerns;Current Psychotropic Meds;Current Stress Concerns     Comments Karren reports doing well with her mental health and is now on medication as well as seeing a therapist 1x/week. She relies on her husband, best friend, therapist, and doctors for support. She reports walking, driving in nice weather, and staying busy to reduce stressors. She is not sleeping well right now due to stomach pain - she is working with her MD on this. Fabiana states that therapy has been good and she continues to go. She is compliant with her medication and states they are effective. Sleep has  improved, but she desires for continued improvement. She feels the biggest barrier to sleeping well is caring for her special-needs son to monitor his behavior. Her husband is supportive and helps in caring for the son and other duties. Stress: her daughter's car is broke-down now and they  is helping her with transportation of her to work and her child to school. Managing stress with exercise and talking with her husband. Latyra is doing well in rehab.  Her PHQ has improve 3 points over the last two weeks.  She is feeling better and moving more.  She is still struggling with her sleep.  When she eats she gets very fatigued and has to rest.  She continues to care for her son who is ready to go on vacation.  He has been having mostly good days, but will sneak into kitchen to get into things.     Expected Outcomes ST: continue to do stress reducing activites  LT: maintain positive attitude ST: continue to attend rehab, exercise at home, and attend therapy. LT: continue to maintain positive attitude and utilize exercise for stress management Short: Continue to exercise for mental outlet and boost Long: Continue to focus on positive     Interventions Stress management education;Encouraged to attend Cardiac Rehabilitation for the exercise Stress management education;Encouraged to attend Pulmonary Rehabilitation for the exercise Stress management education;Encouraged to attend Pulmonary Rehabilitation for the exercise     Continue Psychosocial Services  Follow up required by staff Follow up required by staff Follow up required by staff       Initial Review   Source of Stress Concerns -- Chronic Illness;Occupation;Family --     Comments -- She continues to care for her son and help her other children. She seems positive with her current situation and reaches out for help as needed. She is taking her medication and seeing her therapist. --              Psychosocial Discharge (Final Psychosocial Re-Evaluation):   Psychosocial Re-Evaluation - 10/27/21 1112       Psychosocial Re-Evaluation   Current issues with Current Depression;Current Anxiety/Panic;Current Sleep Concerns;Current Psychotropic Meds;Current Stress Concerns    Comments Lilee is doing well in rehab.  Her PHQ has improve 3 points over the last two weeks.  She is feeling better and moving more.  She is still struggling with her sleep.  When she eats she gets very fatigued and has to rest.  She continues to care for her son who is ready to go on vacation.  He has been having mostly good days, but will sneak into kitchen to get into things.    Expected Outcomes Short: Continue to exercise for mental outlet and boost Long: Continue to focus on positive    Interventions Stress management education;Encouraged to attend Pulmonary Rehabilitation for the exercise    Continue Psychosocial Services  Follow up required by staff             Education: Education Goals: Education classes will be provided on a weekly basis, covering required topics. Participant will state understanding/return demonstration of topics presented.  Learning Barriers/Preferences:  Learning Barriers/Preferences - 08/14/21 1123       Learning Barriers/Preferences   Learning Barriers Sight    Learning Preferences Skilled Demonstration;Video             General Pulmonary Education Topics:  Infection Prevention: - Provides verbal and written material to individual with discussion of infection control including proper hand washing and proper equipment cleaning during exercise session. Flowsheet Row Pulmonary Rehab from 08/14/2021 in [facility] Cardiac and Pulmonary Rehab  Date 08/14/21  Educator [facility]  Instruction Review Code 1- Verbalizes Understanding       Falls Prevention: - Provides verbal and written material to individual with discussion  of falls prevention and safety. Flowsheet Row Pulmonary Rehab from 08/14/2021 in [facility] Cardiac and Pulmonary Rehab  Date 08/14/21   Educator [facility]  Instruction Review Code 1- Verbalizes Understanding       Chronic Lung Disease Review: - Group verbal instruction with posters, models, PowerPoint presentations and videos,  to review new updates, new respiratory medications, new advancements in procedures and treatments. Providing information on websites and "800" numbers for continued self-education. Includes information about supplement oxygen, available portable oxygen systems, continuous and intermittent flow rates, oxygen safety, concentrators, and Medicare reimbursement for oxygen. Explanation of Pulmonary Drugs, including class, frequency, complications, importance of spacers, rinsing mouth after steroid MDI's, and proper cleaning methods for nebulizers. Review of basic lung anatomy and physiology related to function, structure, and complications of lung disease. Review of risk factors. Discussion about methods for diagnosing sleep apnea and types of masks and machines for OSA. Includes a review of the use of types of environmental controls: home humidity, furnaces, filters, dust mite/pet prevention, HEPA vacuums. Discussion about weather changes, air quality and the benefits of nasal washing. Instruction on Warning signs, infection symptoms, calling MD promptly, preventive modes, and value of vaccinations. Review of effective airway clearance, coughing and/or vibration techniques. Emphasizing that all should Create an Action Plan. Written material given at graduation. Flowsheet Row Pulmonary Rehab from 08/17/2021 in [facility] Cardiac and Pulmonary Rehab  Education need identified 08/17/21       AED/CPR: - Group verbal and written instruction with the use of models to demonstrate the basic use of the AED with the basic ABC's of resuscitation.    Anatomy and Cardiac Procedures: - Group verbal and visual presentation and models provide information about basic cardiac anatomy and function. Reviews the testing methods done to  diagnose heart disease and the outcomes of the test results. Describes the treatment choices: Medical Management, Angioplasty, or Coronary Bypass Surgery for treating various heart conditions including Myocardial Infarction, Angina, Valve Disease, and Cardiac Arrhythmias.  Written material given at graduation.   Medication Safety: - Group verbal and visual instruction to review commonly prescribed medications for heart and lung disease. Reviews the medication, class of the drug, and side effects. Includes the steps to properly store meds and maintain the prescription regimen.  Written material given at graduation.   Other: -Provides group and verbal instruction on various topics (see comments)   Knowledge Questionnaire Score:  Knowledge Questionnaire Score - 08/17/21 1702       Knowledge Questionnaire Score   Pre Score 16/18: Exercise, Oxygen              Core Components/Risk Factors/Patient Goals at Admission:  Personal Goals and Risk Factors at Admission - 08/17/21 1720       Core Components/Risk Factors/Patient Goals on Admission    Weight Management Yes;Obesity;Weight Loss    Intervention Weight Management: Develop a combined nutrition and exercise program designed to reach desired caloric intake, while maintaining appropriate intake of nutrient and fiber, sodium and fats, and appropriate energy expenditure required for the weight goal.;Weight Management: Provide education and appropriate resources to help participant work on and attain dietary goals.;Weight Management/Obesity: Establish reasonable short term and long term weight goals.;Obesity: Provide education and appropriate resources to help participant work on and attain dietary goals.    Admit Weight 295 lb (133.8 kg)    Goal Weight: Short Term 290 lb (131.5 kg)    Goal Weight: Long Term 240 lb (108.9 kg)    Expected Outcomes Short Term: Continue to  assess and modify interventions until short term weight is achieved;Long  Term: Adherence to nutrition and physical activity/exercise program aimed toward attainment of established weight goal;Weight Loss: Understanding of general recommendations for a balanced deficit meal plan, which promotes 1-2 lb weight loss per week and includes a negative energy balance of [id number] kcal/d;Understanding recommendations for meals to include 15-35% energy as protein, 25-35% energy from fat, 35-60% energy from carbohydrates, less than $RemoveB'200mg'kZGzDhDV$  of dietary cholesterol, 20-35 gm of total fiber daily;Understanding of distribution of calorie intake throughout the day with the consumption of 4-5 meals/snacks    Improve shortness of breath with ADL's Yes    Intervention Provide education, individualized exercise plan and daily activity instruction to help decrease symptoms of SOB with activities of daily living.    Expected Outcomes Short Term: Improve cardiorespiratory fitness to achieve a reduction of symptoms when performing ADLs;Long Term: Be able to perform more ADLs without symptoms or delay the onset of symptoms    Diabetes Yes    Intervention Provide education about signs/symptoms and action to take for hypo/hyperglycemia.;Provide education about proper nutrition, including hydration, and aerobic/resistive exercise prescription along with prescribed medications to achieve blood glucose in normal ranges: Fasting glucose 65-99 mg/dL    Expected Outcomes Short Term: Participant verbalizes understanding of the signs/symptoms and immediate care of hyper/hypoglycemia, proper foot care and importance of medication, aerobic/resistive exercise and nutrition plan for blood glucose control.;Long Term: Attainment of HbA1C < 7%.    Hypertension Yes    Intervention Monitor prescription use compliance.;Provide education on lifestyle modifcations including regular physical activity/exercise, weight management, moderate sodium restriction and increased consumption of fresh fruit, vegetables, and low fat dairy,  alcohol moderation, and smoking cessation.    Expected Outcomes Short Term: Continued assessment and intervention until BP is < 140/86mm HG in hypertensive participants. < 130/12mm HG in hypertensive participants with diabetes, heart failure or chronic kidney disease.;Long Term: Maintenance of blood pressure at goal levels.    Lipids Yes    Intervention Provide education and support for participant on nutrition & aerobic/resistive exercise along with prescribed medications to achieve LDL '70mg'$ , HDL >$Remo'40mg'FBubv$ .    Expected Outcomes Short Term: Participant states understanding of desired cholesterol values and is compliant with medications prescribed. Participant is following exercise prescription and nutrition guidelines.;Long Term: Cholesterol controlled with medications as prescribed, with individualized exercise RX and with personalized nutrition plan. Value goals: LDL < $Rem'70mg'gDaP$ , HDL > 40 mg.             Education:Diabetes - Individual verbal and written instruction to review signs/symptoms of diabetes, desired ranges of glucose level fasting, after meals and with exercise. Acknowledge that pre and post exercise glucose checks will be done for 3 sessions at entry of program. Flowsheet Row Pulmonary Rehab from 08/14/2021 in [facility] Cardiac and Pulmonary Rehab  Date 08/14/21  Educator [facility]  Instruction Review Code 1- Verbalizes Understanding       Know Your Numbers and Heart Failure: - Group verbal and visual instruction to discuss disease risk factors for cardiac and pulmonary disease and treatment options.  Reviews associated critical values for Overweight/Obesity, Hypertension, Cholesterol, and Diabetes.  Discusses basics of heart failure: signs/symptoms and treatments.  Introduces Heart Failure Zone chart for action plan for heart failure.  Written material given at graduation.   Core Components/Risk Factors/Patient Goals Review:   Goals and Risk Factor Review     Row Name 09/11/21 1141 10/02/21  1122 10/27/21 1118         Core  Components/Risk Factors/Patient Goals Review   Personal Goals Review Weight Management/Obesity;Diabetes Weight Management/Obesity;Hypertension;Diabetes;Improve shortness of breath with ADL's Weight Management/Obesity;Hypertension;Diabetes;Improve shortness of breath with ADL's;Increase knowledge of respiratory medications and ability to use respiratory devices properly.     Review Annistyn has had gatric bypass surgery recently - she is about 5.5 months out. She reports having stomach pain which is making it hard to tolerate food and her medications; she is unable to take her gastric bypass vitamins - her doctor suggested taking 2 MVI per day in the interim. her doctor is going to order a CT to investigate causes. She continues to check her BG at home in the monring - running in the 90s; her last A1C is now 7%, she is due for a new one soon. She is dealing with muscle soreness from moving her body more due to rehab and exercise at home - she takes tylenol for this. Kelda continues to lose weight since the bypass surgery. She is currently at 262 lb per her home scales. Her goal is to lose below 200 lbs. She states that weight loss is sporadic, losing larger amounts then plateauing. She understands that this is related to the surgery. She is having an upper GI on 6/6 to determine if she is having a food intolerance. Diabetes: Ogechi says her sugars have been good but they are creeping up and has an appointment scheduled this week. She has been drinking orange juice and thinks that is related, but has been diluting the orange juice with water to reduce the sugar level and having crystal light. She isn't currently on any diabetic medication and her average BG level is 90-100 fasting. She will discuss this further with her physician to determine their goals for her. ADLs: Pt says it's easier to stand for longer periods of time with less sitting breaks to wash dishes. Mikailah has been walking  to rehab and finds it easier to get up from the couch. However, she has some instability/balance concerns and was encouraged to use assistance to stabilize self. She states her husband is mindful and supportive of her during her instability. She will be discussing this concern with her physician on Thursday, 6/7. Hypertension: Earleen states her blood pressure has remained stable. Jenasia is doing well in rehab. She continues to work on weight loss is down to 258.  She is doing well with her sugar control and has noticed that orange juice was driving it up, so she cut back. She is doing well with her breathing and is trying to do more at home.  She still gets winded walking but better on seated on equipment.  Her pressures have continued to be good.     Expected Outcomes ST: get CT and f/u with doctor for stomach pain LT: continue to manage risk factors ST: continue to monitor blood sugar and talk with PCP about blood sugars and instability;  LT: continue to manage lifestyle risk factors Short: Continue to build up breathing Long: COnitnue to work on weight loss              Core Components/Risk Factors/Patient Goals at Discharge (Final Review):   Goals and Risk Factor Review - 10/27/21 1118       Core Components/Risk Factors/Patient Goals Review   Personal Goals Review Weight Management/Obesity;Hypertension;Diabetes;Improve shortness of breath with ADL's;Increase knowledge of respiratory medications and ability to use respiratory devices properly.    Review Annastyn is doing well in rehab. She continues to work on weight loss  is down to 258.  She is doing well with her sugar control and has noticed that orange juice was driving it up, so she cut back. She is doing well with her breathing and is trying to do more at home.  She still gets winded walking but better on seated on equipment.  Her pressures have continued to be good.    Expected Outcomes Short: Continue to build up breathing Long: COnitnue to work  on weight loss             ITP Comments:  ITP Comments     Row Name 08/14/21 1127 08/17/21 1700 08/21/21 1015 09/13/21 0922 10/11/21 1355   ITP Comments Virtual Visit completed. Patient informed on EP and RD appointment and 6 Minute walk test. Patient also informed of patient health questionnaires on My Chart. Patient Verbalizes understanding. Visit diagnosis can be found in [facility] 04/27/2022. Completed 6MWT and gym orientation. Initial ITP created and sent for review to Dr. Ottie Glazier,  Medical Director. First full day of exercise!  Patient was oriented to gym and equipment including functions, settings, policies, and procedures.  Patient's individual exercise prescription and treatment plan were reviewed.  All starting workloads were established based on the results of the 6 minute walk test done at initial orientation visit.  The plan for exercise progression was also introduced and progression will be customized based on patient's performance and goals. 30 Day review completed. Medical Director ITP review done, changes made as directed, and signed approval by Medical Director. 30 Day review completed. Medical Director ITP review done, changes made as directed, and signed approval by Medical Director.    [place] Name 11/08/21 1034           ITP Comments 30 Day review completed. Medical Director ITP review done, changes made as directed, and signed approval by Medical Director.                Comments:

## 2021-11-09 ENCOUNTER — Ambulatory Visit (INDEPENDENT_AMBULATORY_CARE_PROVIDER_SITE_OTHER): Payer: BC Managed Care – PPO | Admitting: Primary Care

## 2021-11-09 ENCOUNTER — Ambulatory Visit: Payer: BC Managed Care – PPO | Admitting: Psychology

## 2021-11-09 ENCOUNTER — Encounter: Payer: Self-pay | Admitting: Primary Care

## 2021-11-09 ENCOUNTER — Ambulatory Visit: Payer: BC Managed Care – PPO | Admitting: Primary Care

## 2021-11-09 VITALS — BP 128/76 | HR 76 | Temp 98.6°F | Ht 66.0 in | Wt 256.0 lb

## 2021-11-09 DIAGNOSIS — R2681 Unsteadiness on feet: Secondary | ICD-10-CM

## 2021-11-09 DIAGNOSIS — R296 Repeated falls: Secondary | ICD-10-CM | POA: Diagnosis not present

## 2021-11-09 DIAGNOSIS — M5442 Lumbago with sciatica, left side: Secondary | ICD-10-CM

## 2021-11-09 DIAGNOSIS — G4733 Obstructive sleep apnea (adult) (pediatric): Secondary | ICD-10-CM

## 2021-11-09 DIAGNOSIS — E1165 Type 2 diabetes mellitus with hyperglycemia: Secondary | ICD-10-CM | POA: Diagnosis not present

## 2021-11-09 DIAGNOSIS — G8929 Other chronic pain: Secondary | ICD-10-CM

## 2021-11-09 LAB — POCT GLYCOSYLATED HEMOGLOBIN (HGB A1C): Hemoglobin A1C: 6.1 % — AB (ref 4.0–5.6)

## 2021-11-09 NOTE — Assessment & Plan Note (Signed)
Controlled with A1C of 6.1 today!   Continue off m

## 2021-11-09 NOTE — Assessment & Plan Note (Signed)
Suspect her imbalance is partially due to increased chronic back pain. No alarm signs on exam and during HPI.  Referral placed to physical therapy and orthopedics for further evaluation and treatment.

## 2021-11-09 NOTE — Assessment & Plan Note (Signed)
Not using CPAP machine as her autistic son will pull this off of her face.  I strongly advise she reconnect with her neurologist to find other options to treat sleep apnea.

## 2021-11-09 NOTE — Patient Instructions (Signed)
You will be contacted regarding your referral to orthopedics and physical therapy.  Please let us know if you have not been contacted within two weeks.   Continue off metformin!  Please schedule a follow up visit for 6 months for a diabetes check and general follow up.   It was a pleasure to see you today!

## 2021-11-09 NOTE — Progress Notes (Signed)
Subjective:    Patient ID: Brandy [name], female    DOB: 31-Aug-1965, 56 y.o.   MRN: [id number]  Diabetes Pertinent negatives for diabetes include no chest pain and no weakness.    Brandy [name] is a very pleasant 56 y.o. female with a history of hypertension, chronic venous insufficiency, PAD, type 2 diabetes, sleep apnea, chronic back pain, morbid obesity, bypass surgery, lower extremity weakness who presents today for follow up of diabetes, chronic back pain, and to discuss unsteady gait.   1) Type 2 Diabetes: Current medications include: None. She stopped her Metformin around February 2023.  She is checking her blood glucose 1-2 times daily and is getting readings of: 90's-130's   Last A1C: 7.0 in February 2023, 6.1 today Last Eye Exam: Up-to-date Last Foot Exam: Due Pneumonia Vaccination: 2020 Urine Microalbumin: Lisinopril Statin: Atorvastatin  Dietary changes since last visit: She has lost another 36 pounds since her visit in February 2023. She continues to work on her diet, is sticking with her advised meal plan, she continues to follow with a nutritionist.    Exercise: No regular exercise.   2) Unsteady Gait/Chronic Back Pain: Chronic for years, but increased over the last 2-3 months. She has to stand for a few seconds to get her balance before she begins walking. She will sway to the side, she has to touch walls and hold onto counters in her home.   She is not walking much now because of her unsteady gait. She has fallen three times within the last month, all falls occurred at home when walking down three steps to her side porch.   She participates in pulmonary rehab and mostly exercises on machines with seats. She does not use the treadmill due to her imbalance, can only walk about 5 minutes on the treadmill. She was walking on the track, but stopped due to her imbalance. She has been observed to be off balance during pulmonary rehab.   She denies tripping over her feet  and foot drop, increased weakness, numbness, acute unilateral weakness, dizzy, changes in speech, medication changes. She has lost 36 pounds since February 2023 and over 100 pounds since her gastric bypass surgery. She is hydrating well with water.   She has noticed increased bilateral lower back pain over the last few months with radiation of pain down left lower extremity. Previously following with orthopedics, underwent lumbar spine injection in 2021 for which lasted for 1 year.     BP Readings from Last 3 Encounters:  11/09/21 128/76  07/17/21 (!) 128/92  06/20/21 126/78   Wt Readings from Last 3 Encounters:  11/09/21 256 lb (116.1 kg)  08/17/21 295 lb 11.2 oz (134.1 kg)  07/17/21 298 lb 6 oz (135.3 kg)      Review of Systems  Respiratory:  Negative for shortness of breath.   Cardiovascular:  Negative for chest pain.  Musculoskeletal:  Positive for arthralgias and back pain.  Neurological:  Negative for weakness and numbness.         Past Medical History:  Diagnosis Date   Anxiety    Arthritis    Back pain    Brain fog    CHF (congestive heart failure) (HCC)    pt not aware of this   Depression    Diabetes mellitus without complication (HCC)    Difficulty walking    Dysmetabolic syndrome X    Fatigue    Fibromyalgia    neuropathy   Hypertension  Hypertriglyceridemia    Joint pain    Leg weakness    Lower extremity edema    Muscle atrophy    Neuropathy    Obesity    Osteoarthritis    PONV (postoperative nausea and vomiting)    Poor memory    Post-menopausal bleeding    Prediabetes    Rosacea    Sleep apnea    not on cpap yet   Tetanus vaccine causing adverse effect in therapeutic use    Vitamin D deficiency     Social History   Socioeconomic History   Marital status: Married    Spouse name: Ed   Number of children: 3   Years of education: Not on file   Highest education level: Not on file  Occupational History   Occupation: Chief Financial Officer Retirement    Employer: [employer]  Tobacco Use   Smoking status: Never   Smokeless tobacco: Never  Vaping Use   Vaping Use: Never used  Substance and Sexual Activity   Alcohol use: Not Currently    Comment: rarely - once a year   Drug use: No   Sexual activity: Not Currently    Birth control/protection: Post-menopausal  Other Topics Concern   Not on file  Social History Narrative   Best boy      Married; 3 kids (middle child-severely autistic)      No regular exercise   Social Determinants of Health   Financial Resource Strain: Not on file  Food Insecurity: Not on file  Transportation Needs: Not on file  Physical Activity: Not on file  Stress: Not on file  Social Connections: Not on file  Intimate Partner Violence: Not on file    Past Surgical History:  Procedure Laterality Date   BONE TUMOR EXCISION  1975   Right leg   DILATATION & CURETTAGE/HYSTEROSCOPY WITH MYOSURE N/A 10/07/2019   Procedure: DILATATION AND CURETTAGE /HYSTEROSCOPY, Myosure, Polypectomy;  Surgeon: San Perlita Bing, MD;  Location: [facility] OR;  Service: Gynecology;  Laterality: N/A;   ENDOMETRIAL BIOPSY  07/14/2019       epidural steroid injection     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   Procedure: LAPAROSCOPIC ROUX-EN-Y GASTRIC BYPASS WITH UPPER ENDOSCOPY;  Surgeon: Sheliah Hatch, De Blanch, MD;  Location: [facility];  Service: General;  Laterality: N/A;    Family History  Problem Relation Age of Onset   Rheum arthritis Mother    COPD Mother    Stroke Mother    Depression Mother    Anxiety disorder Mother    Eating disorder Mother    Obesity Mother    Hypertension Father    Depression Father    Alcoholism Father    Heart disease Maternal Grandmother    Stroke Maternal Grandmother    Alcohol abuse Maternal Grandmother    Alcohol abuse Maternal Grandfather    Aneurysm Paternal Grandmother    Autism Son        severe   Diabetes Paternal Aunt    Breast cancer Neg Hx      Allergies  Allergen Reactions   Diphenhydramine Hcl Rash    minor rash   Penicillins Rash    Current Outpatient Medications on File Prior to Visit  Medication Sig Dispense Refill   acetaminophen (TYLENOL) 650 MG CR tablet Take 1,300 mg by mouth every 8 (eight) hours as needed for pain.     atorvastatin (LIPITOR) 10 MG tablet TAKE 1 TABLET BY MOUTH EVERY DAY FOR CHOLESTEROL 90 tablet 2  cetirizine (ZYRTEC) 10 MG tablet TAKE 1 TABLET (10 MG TOTAL) BY MOUTH DAILY. FOR ALLERGIES 90 tablet 1   citalopram (CELEXA) 40 MG tablet Take 40 mg by mouth daily.     DULoxetine (CYMBALTA) 30 MG capsule Take 30 mg by mouth daily. Take with 60 mg to equal 90 mg daily     DULoxetine (CYMBALTA) 60 MG capsule Take 60 mg by mouth daily. Take with 30 mg to equal 90 mg daily     folic acid (FOLVITE) 1 MG tablet Take 1 mg by mouth daily.     gabapentin (NEURONTIN) 300 MG capsule TAKE 1 CAPSULE BY MOUTH 3 TIMES A DAY AS NEEDED FOR BACK PAIN 270 capsule 3   lamoTRIgine (LAMICTAL) 100 MG tablet Take 100 mg by mouth every evening.     lamoTRIgine (LAMICTAL) 200 MG tablet Take 200 mg by mouth in the morning.     lisinopril (ZESTRIL) 30 MG tablet TAKE 1 TABLET (30 MG TOTAL) BY MOUTH DAILY. FOR BLOOD PRESSURE 90 tablet 2   metroNIDAZOLE (METROCREAM) 0.75 % cream Apply 1 application topically daily as needed (rosacea).     Multiple Minerals-Vitamins (CALCIUM-MAGNESIUM-ZINC-D3) TABS Take 2 tablets by mouth daily.     ondansetron (ZOFRAN-ODT) 4 MG disintegrating tablet Dissolve 1 tablet (4 mg total) by mouth every 6 (six) hours as needed for nausea or vomiting. 20 tablet 0   pantoprazole (PROTONIX) 40 MG tablet Take 1 tablet (40 mg total) by mouth daily. 90 tablet 0   sucralfate (CARAFATE) 1 g tablet Take by mouth.     traZODone (DESYREL) 50 MG tablet Take 50 mg by mouth at bedtime as needed for sleep.     Vitamin D, Ergocalciferol, (DRISDOL) 1.25 MG (50000 UNIT) CAPS capsule TAKE 1 CAPSULE BY MOUTH ONCE WEEKLY FOR  VITAMIN D. 12 capsule 1   No current facility-administered medications on file prior to visit.    BP 128/76   Pulse 76   Temp 98.6 F (37 C) (Oral)   Ht 5\' 6"  (1.676 m)   Wt 256 lb (116.1 kg)   LMP  (LMP Unknown)   SpO2 98%   BMI 41.32 kg/m  Objective:   Physical Exam Cardiovascular:     Rate and Rhythm: Normal rate and regular rhythm.  Pulmonary:     Effort: Pulmonary effort is normal.     Breath sounds: Normal breath sounds.  Musculoskeletal:     Cervical back: Neck supple.     Comments: Slightly unsteady gait during visit. She is able to ambulate without assistance.  She does tend to lean forward when walking, specifically on her toes.  Skin:    General: Skin is warm and dry.  Psychiatric:        Mood and Affect: Mood normal.           Assessment & Plan:   Problem List Items Addressed This Visit       Respiratory   Severe obstructive sleep apnea-hypopnea syndrome    Not using CPAP machine as her autistic son will pull this off of her face.  I strongly advise she reconnect with her neurologist to find other options to treat sleep apnea.        Endocrine   Type 2 diabetes mellitus with hyperglycemia (HCC) - Primary    Controlled with A1C of 6.1 today!   Continue off m      Relevant Orders   POCT glycosylated hemoglobin (Hb A1C) (Completed)     Nervous and Auditory  Chronic bilateral low back pain with left-sided sciatica    Suspect her imbalance is partially due to increased chronic back pain. No alarm signs on exam and during HPI.  Referral placed to physical therapy and orthopedics for further evaluation and treatment.      Relevant Orders   Ambulatory referral to Physical Therapy   Ambulatory referral to Orthopedic Surgery   Other Visit Diagnoses     Unsteady gait       Relevant Orders   Ambulatory referral to Physical Therapy   Ambulatory referral to Orthopedic Surgery   Recurrent falls       Relevant Orders   Ambulatory referral  to Physical Therapy   Ambulatory referral to Orthopedic Surgery          Doreene Nest, NP

## 2021-11-13 ENCOUNTER — Encounter: Payer: BC Managed Care – PPO | Admitting: *Deleted

## 2021-11-13 DIAGNOSIS — I5032 Chronic diastolic (congestive) heart failure: Secondary | ICD-10-CM | POA: Diagnosis not present

## 2021-11-13 DIAGNOSIS — R0600 Dyspnea, unspecified: Secondary | ICD-10-CM

## 2021-11-13 DIAGNOSIS — R0609 Other forms of dyspnea: Secondary | ICD-10-CM | POA: Diagnosis not present

## 2021-11-13 NOTE — Progress Notes (Signed)
Daily Session Note  Patient Details  Name: Brandy [name] MRN: [id number] Date of Birth: [birth year] Referring Provider:   Flowsheet Row Pulmonary Rehab from 08/17/2021 in [facility] Cardiac and Pulmonary Rehab  Referring Provider Ida Rogue MD       Encounter Date: 11/13/2021  Check In:  Session Check In - 11/13/21 1558       Check-In   Supervising physician immediately available to respond to emergencies See telemetry face sheet for immediately available ER MD    Location [facility]-Cardiac & Pulmonary Rehab    Staff Present Nyoka Cowden, RN, BSN, Ardeth Sportsman, RDN, Tawanna Solo, MS, ASCM CEP, Exercise Physiologist    Virtual Visit No    Medication changes reported     No    Fall or balance concerns reported    No    Tobacco Cessation No Change    Warm-up and Cool-down Performed on first and last piece of equipment    Resistance Training Performed Yes    VAD Patient? No      Pain Assessment   Currently in Pain? No/denies                Social History   Tobacco Use  Smoking Status Never  Smokeless Tobacco Never    Goals Met:  Independence with exercise equipment Exercise tolerated well No report of concerns or symptoms today  Goals Unmet:  Not Applicable  Comments: Pt able to follow exercise prescription today without complaint.  Will continue to monitor for progression.    Dr. Emily Filbert is Medical Director for [name].  Dr. Ottie Glazier is Medical Director for [facility] Pulmonary Rehabilitation.

## 2021-11-16 ENCOUNTER — Ambulatory Visit: Payer: BC Managed Care – PPO | Admitting: Psychology

## 2021-11-16 ENCOUNTER — Ambulatory Visit: Payer: BC Managed Care – PPO

## 2021-11-21 DIAGNOSIS — I5032 Chronic diastolic (congestive) heart failure: Secondary | ICD-10-CM | POA: Diagnosis not present

## 2021-11-21 DIAGNOSIS — R0600 Dyspnea, unspecified: Secondary | ICD-10-CM

## 2021-11-21 DIAGNOSIS — R0609 Other forms of dyspnea: Secondary | ICD-10-CM | POA: Diagnosis not present

## 2021-11-21 NOTE — Progress Notes (Signed)
Daily Session Note  Patient Details  Name: Brandy [name] MRN: [id number] Date of Birth: 1965-06-11 Referring Provider:   Flowsheet Row Pulmonary Rehab from 08/17/2021 in [facility] Cardiac and Pulmonary Rehab  Referring Provider Ida Rogue MD       Encounter Date: 11/21/2021  Check In:  Session Check In - 11/21/21 1111       Check-In   Supervising physician immediately available to respond to emergencies See telemetry face sheet for immediately available ER MD    Location [facility]-Cardiac & Pulmonary Rehab    Staff Present Earlean Shawl, BS, ACSM CEP, Exercise Physiologist;Blandina Renaldo, RN,BC,MSN;Jessica Thebes, MA, RCEP, CCRP, CCET    Virtual Visit No    Medication changes reported     No    Fall or balance concerns reported    No    Tobacco Cessation No Change    Warm-up and Cool-down Performed on first and last piece of equipment    Resistance Training Performed Yes    VAD Patient? No    PAD/SET Patient? No      Pain Assessment   Currently in Pain? No/denies    Multiple Pain Sites No                Social History   Tobacco Use  Smoking Status Never  Smokeless Tobacco Never    Goals Met:  Independence with exercise equipment Exercise tolerated well No report of concerns or symptoms today Strength training completed today  Goals Unmet:  Not Applicable  Comments: Pt able to follow exercise prescription today without complaint.  Will continue to monitor for progression.   Reviewed home exercise with pt today.  Pt plans to walk and use staff videos at home for exercise.  She also has PT exercises.  Reviewed THR, pulse, RPE, sign and symptoms, pulse oximetery and when to call 911 or MD.  Also discussed weather considerations and indoor options.  Pt voiced understanding.   Dr. Emily Filbert is Medical Director for [place].  Dr. Ottie Glazier is Medical Director for [facility] Pulmonary Rehabilitation.

## 2021-11-22 ENCOUNTER — Encounter: Payer: BC Managed Care – PPO | Admitting: *Deleted

## 2021-11-22 DIAGNOSIS — R0609 Other forms of dyspnea: Secondary | ICD-10-CM

## 2021-11-22 DIAGNOSIS — I5032 Chronic diastolic (congestive) heart failure: Secondary | ICD-10-CM

## 2021-11-22 DIAGNOSIS — R0600 Dyspnea, unspecified: Secondary | ICD-10-CM

## 2021-11-22 NOTE — Progress Notes (Signed)
Daily Session Note  Patient Details  Name: Brandy [name] MRN: [id number] Date of Birth: [birth year] Referring Provider:   Flowsheet Row Pulmonary Rehab from 08/17/2021 in [facility] Cardiac and Pulmonary Rehab  Referring Provider Ida Rogue MD       Encounter Date: 11/22/2021  Check In:  Session Check In - 11/22/21 1751       Check-In   Supervising physician immediately available to respond to emergencies See telemetry face sheet for immediately available ER MD    Location [facility]-Cardiac & Pulmonary Rehab    Staff Present Nyoka Cowden, RN, BSN, MA;Meredith Sherryll Burger, RN Margurite Auerbach, MS, ASCM CEP, Exercise Physiologist    Virtual Visit No    Medication changes reported     No    Fall or balance concerns reported    No    Tobacco Cessation No Change    Warm-up and Cool-down Performed on first and last piece of equipment    Resistance Training Performed Yes    VAD Patient? No    PAD/SET Patient? No                Social History   Tobacco Use  Smoking Status Never  Smokeless Tobacco Never    Goals Met:  Independence with exercise equipment Exercise tolerated well No report of concerns or symptoms today  Goals Unmet:  Not Applicable  Comments: Pt able to follow exercise prescription today without complaint.  Will continue to monitor for progression.    Dr. Emily Filbert is Medical Director for [facility].  Dr. Ottie Glazier is Medical Director for [facility] Pulmonary Rehabilitation.

## 2021-11-22 NOTE — Progress Notes (Signed)
Daily Session Note  Patient Details  Name: SAUMYA HUKILL MRN: [id number] Date of Birth: 27-May-1965 Referring Provider:   Flowsheet Row Pulmonary Rehab from 08/17/2021 in [facility] Cardiac and Pulmonary Rehab  Referring Provider Ida Rogue MD       Encounter Date: 11/22/2021  Check In:  Session Check In - 11/22/21 1617       Check-In   Supervising physician immediately available to respond to emergencies See telemetry face sheet for immediately available ER MD    Location [facility]-Cardiac & Pulmonary Rehab    Staff Present Nyoka Cowden, RN, BSN, MA;Meredith Sherryll Burger, RN Margurite Auerbach, MS, ASCM CEP, Exercise Physiologist    Virtual Visit No    Medication changes reported     No    Fall or balance concerns reported    No    Tobacco Cessation No Change    Warm-up and Cool-down Performed on first and last piece of equipment    Resistance Training Performed Yes    VAD Patient? No    PAD/SET Patient? No      Pain Assessment   Currently in Pain? No/denies                Social History   Tobacco Use  Smoking Status Never  Smokeless Tobacco Never    Goals Met:  Independence with exercise equipment Exercise tolerated well No report of concerns or symptoms today  Goals Unmet:  Not Applicable  Comments: Pt able to follow exercise prescription today without complaint.  Will continue to monitor for progression.    Dr. Emily Filbert is Medical Director for [place].  Dr. Ottie Glazier is Medical Director for [facility] Pulmonary Rehabilitation.

## 2021-11-23 ENCOUNTER — Ambulatory Visit (INDEPENDENT_AMBULATORY_CARE_PROVIDER_SITE_OTHER): Payer: BC Managed Care – PPO | Admitting: Psychology

## 2021-11-23 ENCOUNTER — Ambulatory Visit: Payer: BC Managed Care – PPO | Admitting: Psychology

## 2021-11-23 DIAGNOSIS — F339 Major depressive disorder, recurrent, unspecified: Secondary | ICD-10-CM | POA: Diagnosis not present

## 2021-11-23 NOTE — Progress Notes (Signed)
[facility] Counselor/Therapist Progress Note  Patient ID: AHRIA SLAPPEY, MRN: [id number]    Date: 11/23/21  Time Spent: 12:07 pm - 1:01 pm:  54 Minutes  Treatment Type: Individual Therapy.  Reported Symptoms: Anxiety   Mental Status Exam: Appearance:  Fairly Groomed     Behavior: Appropriate  Motor: Normal  Speech/Language:  Clear and Coherent and Normal Rate  Affect: Appropriate  Mood: normal  Thought process: normal  Thought content:   WNL  Sensory/Perceptual disturbances:   WNL  Orientation: oriented to person, place, time/date, and situation  Attention: Good  Concentration: Good  Memory: WNL  Fund of knowledge:  Good  Insight:   Good  Judgment:  Good  Impulse Control: Good   Risk Assessment: Danger to Self:  No Self-injurious Behavior: No Danger to Others: No Duty to Warn:no Physical Aggression / Violence:No  Access to Firearms a concern: No  Gang Involvement:No   Subjective:   Monzerrath Mcburney Sinatra participated car, via video, and consented to treatment. Therapist participated from home office. We met online due to [place] pandemic. Saydi Kobel Fabrizio reviewed the events of the past week. She noted some stress, financially, due to "overspending". She noted this being a point of contention regarding this. We explored this, during the session, and the effect on her relationship. She noted awareness of her overspending, after the fact. She noted wanting to do nice things  for others and that this "makes me feel good". We worked on exploring this during the session and processing the cost and benefit of financially overextending is such situations. We worked on identifying ways to show care and appreciation while reducing the cost (Stress) and work on being more adaptive in this area. Milanya noted a need "trying loving yourself instead of the idea of other people loving you". We will explore this going forward. Nimsi was engaged and motivated during the session. She  expressed commitment towards our goals. Therapist praised Tuwanna and provided supportive therapy.  Interventions: interpersonal stressors and assertive communication.  Interventions: Cognitive Behavioral Therapy and Interpersonal   Diagnosis: Episode of recurrent major depressive disorder, unspecified depression episode severity ([place])  Treatment Plan:  Client Abilities/Strengths Kennedey is intelligent, forthcoming, self-aware, and motivated for change.   Support System: Friend [name] and Family.   Client Treatment Preferences Outpatient Therapy.  Client Statement of Needs Arthelia's goals for treatment managing her mood and symptoms, processing past events, focus on self-care, set boundaries for self and others, decrease negative self-talk, decrease rumination, process relationship stressors, and improve health.   Treatment Level Weekly  Symptoms  Depressive symptoms: Decreased interest, feeling down, poor sleep, lethargy, psycho-motor retardation, feeling bad about self.  (Status: maintained)  Anxiety: Difficulty managing worry, worrying about different things, restlessness, irritability, afraid something awful might happen  (Status: maintained)  Goals:  Julanne experiences symptoms of depression and anxiety.   Target Date: 2022-06-08 Frequency: Weekly  Progress: 10 Modality: individual    Therapist will provide referrals for additional resources as appropriate.  Therapist will provide psycho-education regarding Parish's diagnosis and corresponding treatment approaches and interventions. Licensed Clinical Social Worker, [place], LCSW will support the patient's ability to achieve the goals identified. will employ CBT, BA, Problem-solving, Solution Focused, Mindfulness,  coping skills, & other evidenced-based practices will be used to promote progress towards healthy functioning to help manage decrease symptoms associated with her diagnosis.   Reduce overall level, frequency, and  intensity of the feelings of depression and anxiety as evidenced by decreased overall symptoms  of depression and anxiety from 6 to 7 days/week to 0 to 1 days/week per client report for at least 3 consecutive months. Verbally express understanding of the relationship between feelings of depression, anxiety and their impact on thinking patterns and behaviors. Verbalize an understanding of the role that distorted thinking plays in creating fears, excessive worry, and ruminations.  [name] participated in the creation of the treatment plan)    [name], LCSW

## 2021-11-28 ENCOUNTER — Encounter: Payer: BC Managed Care – PPO | Attending: General Surgery | Admitting: *Deleted

## 2021-11-28 DIAGNOSIS — R0609 Other forms of dyspnea: Secondary | ICD-10-CM | POA: Insufficient documentation

## 2021-11-28 DIAGNOSIS — I5032 Chronic diastolic (congestive) heart failure: Secondary | ICD-10-CM | POA: Insufficient documentation

## 2021-11-28 DIAGNOSIS — R0600 Dyspnea, unspecified: Secondary | ICD-10-CM | POA: Insufficient documentation

## 2021-11-28 NOTE — Progress Notes (Signed)
Daily Session Note  Patient Details  Name: Brandy [name] MRN: [id number] Date of Birth: Mar 13, 1966 Referring Provider:   Flowsheet Row Pulmonary Rehab from 08/17/2021 in [facility]  Referring Provider Ida Rogue MD       Encounter Date: 11/28/2021  Check In:  Session Check In - 11/28/21 1144       Check-In   Supervising physician immediately available to respond to emergencies See telemetry face sheet for immediately available ER MD    Location [facility]    Staff Present Heath Lark, RN, BSN, Laveda Norman, BS, ACSM CEP, Exercise Physiologist;Noah Tickle, BS, Exercise Physiologist    Virtual Visit No    Medication changes reported     No    Fall or balance concerns reported    No    Warm-up and Cool-down Performed on first and last piece of equipment    Resistance Training Performed Yes    VAD Patient? No    PAD/SET Patient? No      Pain Assessment   Currently in Pain? No/denies                Social History   Tobacco Use  Smoking Status Never  Smokeless Tobacco Never    Goals Met:  Proper associated with RPD/PD & O2 Sat Independence with exercise equipment Exercise tolerated well No report of concerns or symptoms today  Goals Unmet:  Not Applicable  Comments: Pt able to follow exercise prescription today without complaint.  Will continue to monitor for progression.    Dr. Emily Filbert is Medical Director for [facility].  Dr. Ottie Glazier is Medical Director for [facility].

## 2021-11-29 ENCOUNTER — Encounter: Payer: BC Managed Care – PPO | Admitting: *Deleted

## 2021-11-29 DIAGNOSIS — R0609 Other forms of dyspnea: Secondary | ICD-10-CM

## 2021-11-29 DIAGNOSIS — I5032 Chronic diastolic (congestive) heart failure: Secondary | ICD-10-CM | POA: Diagnosis not present

## 2021-11-29 DIAGNOSIS — R0600 Dyspnea, unspecified: Secondary | ICD-10-CM

## 2021-11-29 NOTE — Progress Notes (Signed)
Daily Session Note  Patient Details  Name: Brandy [name] MRN: [id number] Date of Birth: 09/29/1965 Referring Provider:   Flowsheet Row Pulmonary Rehab from 08/17/2021 in [facility]  Referring Provider Gollan, Timothy MD       Encounter Date: 11/29/2021  Check In:  Session Check In - 11/29/21 1112       Check-In   Supervising physician immediately available to respond to emergencies See telemetry face sheet for immediately available ER MD    Location [facility]    Staff Present Melissa Caiola, RDN, LDN;Meredith Craven, RN BSN;Noah Tickle, BS, Exercise Physiologist;Laureen Brown, BS, RRT, CPFT    Virtual Visit No    Medication changes reported     No    Fall or balance concerns reported    No    Warm-up and Cool-down Performed on first and last piece of equipment    Resistance Training Performed Yes    VAD Patient? No    PAD/SET Patient? No      Pain Assessment   Currently in Pain? No/denies                Social History   Tobacco Use  Smoking Status Never  Smokeless Tobacco Never    Goals Met:  Independence with exercise equipment Exercise tolerated well No report of concerns or symptoms today Strength training completed today  Goals Unmet:  Not Applicable  Comments: Pt able to follow exercise prescription today without complaint.  Will continue to monitor for progression.    Dr. Mark Miller is Medical Director for [facility].  Dr. Fuad Aleskerov is Medical Director for [facility]. 

## 2021-11-30 ENCOUNTER — Ambulatory Visit (INDEPENDENT_AMBULATORY_CARE_PROVIDER_SITE_OTHER): Payer: BC Managed Care – PPO | Admitting: Psychology

## 2021-11-30 ENCOUNTER — Ambulatory Visit: Payer: BC Managed Care – PPO | Admitting: Psychology

## 2021-11-30 DIAGNOSIS — F339 Major depressive disorder, recurrent, unspecified: Secondary | ICD-10-CM

## 2021-11-30 NOTE — Progress Notes (Signed)
[place] Counselor/Therapist Progress Note  Patient ID: Brandy [name], MRN: [id number]    Date: 11/30/21  Time Spent: 12:23 pm - 1:01 pm:  38 Minutes  Treatment Type: Individual Therapy.  Reported Symptoms: Anxiety   Mental Status Exam: Appearance:  Fairly Groomed     Behavior: Appropriate  Motor: Normal  Speech/Language:  Clear and Coherent and Normal Rate  Affect: Appropriate  Mood: normal  Thought process: normal  Thought content:   WNL  Sensory/Perceptual disturbances:   WNL  Orientation: oriented to person, place, time/date, and situation  Attention: Good  Concentration: Good  Memory: WNL  Fund of knowledge:  Good  Insight:   Good  Judgment:  Good  Impulse Control: Good   Risk Assessment: Danger to Self:  No Self-injurious Behavior: No Danger to Others: No Duty to Warn:no Physical Aggression / Violence:No  Access to Firearms a concern: No  Gang Involvement:No   Subjective:   Brandy [name] participated car, via video, and consented to treatment. Therapist participated from home office. We met online due to [place] pandemic. Brandy [name] reviewed the events of the past week. Brandy [name] noted being late to the appointment due to needing to complete a chore. 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 She noted wanting people's attention and to be loved noting "that makes me happy" & "If I don't get attention, then I assume they don't like me and my self-esteem gets hit". She noted that this makes her "sad". She noted that her mom did not get enough attention from her father. She noted her father being "gone a lot" and Brandy [name] only receiving attention when he was around. She noted her father being gone with work, overtime, and extracurricular activities. She noted his involvement was primarily in activities he was interested in. She noted her father buying "the best" of any belonging and gift. She noted her mother doing  similarly as well. However, she noted two instances in which they did not do their best, removing stitches and and not being sent to perochial school. We will explore this going forward. Brandy [name] was engaged and motivated during the session. She expressed commitment towards our goals. Therapist praised Brandy [name] and provided supportive therapy.   Interventions: CBT  Interventions: Cognitive Behavioral Therapy and Interpersonal   Diagnosis: Episode of recurrent major depressive disorder, unspecified depression episode severity ([place])  Treatment Plan:  [name], forthcoming, self-aware, and motivated for change.   Support System: Friend Brandy [name]) and Family.   Client Treatment Preferences Outpatient Therapy.  Client Statement of Needs Brandy [name]'s goals for treatment managing her mood and symptoms, processing past events, focus on self-care, set boundaries for self and others, decrease negative self-talk, decrease rumination, process relationship stressors, and improve health.   Treatment Level Weekly  Symptoms  Depressive symptoms: Decreased interest, feeling down, poor sleep, lethargy, psycho-motor retardation, feeling bad about self.  (Status: maintained)  Anxiety: Difficulty managing worry, worrying about different things, restlessness, irritability, afraid something awful might happen  (Status: maintained)  Goals:  Brandy [name] experiences symptoms of depression and anxiety.   Target Date: 2022-06-08 Frequency: Weekly  Progress: 10 Modality: individual    Therapist will provide referrals for additional resources as appropriate.  Therapist will provide psycho-education regarding Brandy [name]'s diagnosis and corresponding treatment approaches and interventions. Licensed Clinical Social Worker, [place], LCSW will support the patient's ability to achieve the goals identified. will employ CBT, BA, Problem-solving, Solution Focused, Mindfulness,  coping  skills, & other  evidenced-based practices will be used to promote progress towards healthy functioning to help manage decrease symptoms associated with her diagnosis.   Reduce overall level, frequency, and intensity of the feelings of depression and anxiety as evidenced by decreased overall symptoms of depression and anxiety from 6 to 7 days/week to 0 to 1 days/week per client report for at least 3 consecutive months. Verbally express understanding of the relationship between feelings of depression, anxiety and their impact on thinking patterns and behaviors. Verbalize an understanding of the role that distorted thinking plays in creating fears, excessive worry, and ruminations.  Brandy [name] participated in the creation of the treatment plan)    [name], LCSW

## 2021-12-06 ENCOUNTER — Encounter (INDEPENDENT_AMBULATORY_CARE_PROVIDER_SITE_OTHER): Payer: Self-pay

## 2021-12-06 ENCOUNTER — Encounter: Payer: Self-pay | Admitting: *Deleted

## 2021-12-06 DIAGNOSIS — I5032 Chronic diastolic (congestive) heart failure: Secondary | ICD-10-CM | POA: Diagnosis not present

## 2021-12-06 DIAGNOSIS — R0600 Dyspnea, unspecified: Secondary | ICD-10-CM

## 2021-12-06 DIAGNOSIS — R0609 Other forms of dyspnea: Secondary | ICD-10-CM | POA: Diagnosis not present

## 2021-12-06 NOTE — Progress Notes (Signed)
Pulmonary Individual Treatment Plan  Patient Details  Name: SELENNE COGGIN MRN: [id number] Date of Birth: 1965-09-21 Referring Provider:   Flowsheet Row Pulmonary Rehab from 08/17/2021 in [facility] Cardiac and Pulmonary Rehab  Referring Provider Ida Rogue MD       Initial Encounter Date:  Flowsheet Row Pulmonary Rehab from 08/17/2021 in [facility] Cardiac and Pulmonary Rehab  Date 08/17/21       Visit Diagnosis: Dyspnea, unspecified type  Patient's Home Medications on Admission:  Current Outpatient Medications:    acetaminophen (TYLENOL) 650 MG CR tablet, Take 1,300 mg by mouth every 8 (eight) hours as needed for pain., Disp: , Rfl:    atorvastatin (LIPITOR) 10 MG tablet, TAKE 1 TABLET BY MOUTH EVERY DAY FOR CHOLESTEROL, Disp: 90 tablet, Rfl: 2   cetirizine (ZYRTEC) 10 MG tablet, TAKE 1 TABLET (10 MG TOTAL) BY MOUTH DAILY. FOR ALLERGIES, Disp: 90 tablet, Rfl: 1   citalopram (CELEXA) 40 MG tablet, Take 40 mg by mouth daily., Disp: , Rfl:    DULoxetine (CYMBALTA) 30 MG capsule, Take 30 mg by mouth daily. Take with 60 mg to equal 90 mg daily, Disp: , Rfl:    DULoxetine (CYMBALTA) 60 MG capsule, Take 60 mg by mouth daily. Take with 30 mg to equal 90 mg daily, Disp: , Rfl:    folic acid (FOLVITE) 1 MG tablet, Take 1 mg by mouth daily., Disp: , Rfl:    gabapentin (NEURONTIN) 300 MG capsule, TAKE 1 CAPSULE BY MOUTH 3 TIMES A DAY AS NEEDED FOR BACK PAIN, Disp: 270 capsule, Rfl: 3   lamoTRIgine (LAMICTAL) 100 MG tablet, Take 100 mg by mouth every evening., Disp: , Rfl:    lamoTRIgine (LAMICTAL) 200 MG tablet, Take 200 mg by mouth in the morning., Disp: , Rfl:    lisinopril (ZESTRIL) 30 MG tablet, TAKE 1 TABLET (30 MG TOTAL) BY MOUTH DAILY. FOR BLOOD PRESSURE, Disp: 90 tablet, Rfl: 2   metroNIDAZOLE (METROCREAM) 0.75 % cream, Apply 1 application topically daily as needed (rosacea)., Disp: , Rfl:    Multiple Minerals-Vitamins (CALCIUM-MAGNESIUM-ZINC-D3) TABS, Take 2 tablets by mouth daily., Disp: ,  Rfl:    ondansetron (ZOFRAN-ODT) 4 MG disintegrating tablet, Dissolve 1 tablet (4 mg total) by mouth every 6 (six) hours as needed for nausea or vomiting., Disp: 20 tablet, Rfl: 0   pantoprazole (PROTONIX) 40 MG tablet, Take 1 tablet (40 mg total) by mouth daily., Disp: 90 tablet, Rfl: 0   sucralfate (CARAFATE) 1 g tablet, Take by mouth., Disp: , Rfl:    traZODone (DESYREL) 50 MG tablet, Take 50 mg by mouth at bedtime as needed for sleep., Disp: , Rfl:    Vitamin D, Ergocalciferol, (DRISDOL) 1.25 MG (50000 UNIT) CAPS capsule, TAKE 1 CAPSULE BY MOUTH ONCE WEEKLY FOR VITAMIN D., Disp: 12 capsule, Rfl: 1  Past Medical History: Past Medical History:  Diagnosis Date   Anxiety    Arthritis    Back pain    Brain fog    CHF (congestive heart failure) [place]    pt not aware of this   Depression    Diabetes mellitus without complication (HCC)    Difficulty walking    Dysmetabolic syndrome X    Fatigue    Fibromyalgia    neuropathy   Hypertension    Hypertriglyceridemia    Joint pain    Leg weakness    Lower extremity edema    Muscle atrophy    Neuropathy    Obesity    Osteoarthritis  PONV (postoperative nausea and vomiting)    Poor memory    Post-menopausal bleeding    Prediabetes    Rosacea    Sleep apnea    not on cpap yet   Tetanus vaccine causing adverse effect in therapeutic use    Vitamin D deficiency     Tobacco Use: Social History   Tobacco Use  Smoking Status Never  Smokeless Tobacco Never    Labs: Review Flowsheet  More data exists      Latest Ref Rng & Units 06/01/2020 10/04/2020 03/20/2021 06/20/2021 11/09/2021  Labs for ITP Cardiac and Pulmonary Rehab  Cholestrol 0 - 200 mg/dL 137  - - 166  -  LDL (calc) 0 - 99 mg/dL 49  - - 92  -  HDL-C >39.00 mg/dL 57.30  - - 40.10  -  Trlycerides 0.0 - 149.0 mg/dL 152.0  - - 169.0  -  Hemoglobin A1c 4.0 - 5.6 % 7.0  8.6  7.9  7.0  6.1      Pulmonary Assessment Scores:  Pulmonary Assessment Scores     Row Name  08/17/21 1703         ADL UCSD   ADL Phase Entry     SOB Score total 85     Rest 0     Walk 3     Stairs 5     Bath 3     Dress 2     Shop 5       CAT Score   CAT Score 13       mMRC Score   mMRC Score 2              UCSD: Self-administered rating of dyspnea associated with activities of daily living (ADLs) 6-point scale (0 = "not at all" to 5 = "maximal or unable to do because of breathlessness")  Scoring Scores range from 0 to 120.  Minimally important difference is 5 units  CAT: CAT can identify the health impairment of COPD patients and is better correlated with disease progression.  CAT has a scoring range of zero to 40. The CAT score is classified into four groups of low (less than 10), medium (10 - 20), high (21-30) and very high (31-40) based on the impact level of disease on health status. A CAT score over 10 suggests significant symptoms.  A worsening CAT score could be explained by an exacerbation, poor medication adherence, poor inhaler technique, or progression of COPD or comorbid conditions.  CAT MCID is 2 points  mMRC: mMRC (Modified Medical Research Council) Dyspnea Scale is used to assess the degree of baseline functional disability in patients of respiratory disease due to dyspnea. No minimal important difference is established. A decrease in score of 1 point or greater is considered a positive change.   Pulmonary Function Assessment:  Pulmonary Function Assessment - 08/14/21 1123       Breath   Shortness of Breath Limiting activity;Yes             Exercise Target Goals: Exercise Program Goal: Individual exercise prescription set using results from initial 6 min walk test and THRR while considering  patient's activity barriers and safety.   Exercise Prescription Goal: Initial exercise prescription builds to 30-45 minutes a day of aerobic activity, 2-3 days per week.  Home exercise guidelines will be given to patient during program as part of  exercise prescription that the participant will acknowledge.  Education: Aerobic Exercise: - Group verbal and visual presentation  on the components of exercise prescription. Introduces F.I.T.T principle from ACSM for exercise prescriptions.  Reviews F.I.T.T. principles of aerobic exercise including progression. Written material given at graduation. Flowsheet Row Pulmonary Rehab from 08/17/2021 in [facility] Cardiac and Pulmonary Rehab  Education need identified 08/17/21       Education: Resistance Exercise: - Group verbal and visual presentation on the components of exercise prescription. Introduces F.I.T.T principle from ACSM for exercise prescriptions  Reviews F.I.T.T. principles of resistance exercise including progression. Written material given at graduation.    Education: Exercise & Equipment Safety: - Individual verbal instruction and demonstration of equipment use and safety with use of the equipment. Flowsheet Row Pulmonary Rehab from 08/14/2021 in [facility] Cardiac and Pulmonary Rehab  Date 08/14/21  Educator [facility]  Instruction Review Code 1- Verbalizes Understanding       Education: Exercise Physiology & General Exercise Guidelines: - Group verbal and written instruction with models to review the exercise physiology of the cardiovascular system and associated critical values. Provides general exercise guidelines with specific guidelines to those with heart or lung disease.  Flowsheet Row Pulmonary Rehab from 08/17/2021 in [facility] Cardiac and Pulmonary Rehab  Education need identified 08/17/21       Education: Flexibility, Balance, Mind/Body Relaxation: - Group verbal and visual presentation with interactive activity on the components of exercise prescription. Introduces F.I.T.T principle from ACSM for exercise prescriptions. Reviews F.I.T.T. principles of flexibility and balance exercise training including progression. Also discusses the mind body connection.  Reviews various relaxation  techniques to help reduce and manage stress (i.e. Deep breathing, progressive muscle relaxation, and visualization). Balance handout provided to take home. Written material given at graduation.   Activity Barriers & Risk Stratification:  Activity Barriers & Cardiac Risk Stratification - 08/17/21 1717       Activity Barriers & Cardiac Risk Stratification   Activity Barriers Deconditioning;Muscular Weakness;Shortness of Breath;Fibromyalgia;Joint Problems             6 Minute Walk:  6 Minute Walk     Row Name 08/17/21 1715         6 Minute Walk   Phase Initial     Distance 765 feet     Walk Time 6 minutes     # of Rest Breaks 0     MPH 1.44     METS 2.17     RPE 13     Perceived Dyspnea  2     VO2 Peak 7.6     Symptoms Yes (comment)     Comments SOB     Resting HR 65 bpm     Resting BP 140/70     Resting Oxygen Saturation  96 %     Exercise Oxygen Saturation  during 6 min walk 96 %     Max Ex. HR 140 bpm     Max Ex. BP 154/78     2 Minute Post BP 138/78       Interval HR   1 Minute HR 98     2 Minute HR 115     3 Minute HR 129     4 Minute HR 137     5 Minute HR 139     6 Minute HR 140     2 Minute Post HR 75     Interval Heart Rate? Yes       Interval Oxygen   Interval Oxygen? Yes     Baseline Oxygen Saturation % 96 %     1 Minute  Oxygen Saturation % 97 %     1 Minute Liters of Oxygen 0 L  RA     2 Minute Oxygen Saturation % 99 %     2 Minute Liters of Oxygen 0 L     3 Minute Oxygen Saturation % 98 %     3 Minute Liters of Oxygen 0 L     4 Minute Oxygen Saturation % 98 %     4 Minute Liters of Oxygen 0 L     5 Minute Oxygen Saturation % 99 %     5 Minute Liters of Oxygen 0 L     6 Minute Oxygen Saturation % 98 %     6 Minute Liters of Oxygen 0 L     2 Minute Post Oxygen Saturation % 97 %     2 Minute Post Liters of Oxygen 0 L             Oxygen Initial Assessment:  Oxygen Initial Assessment - 08/17/21 1702       Home Oxygen   Home Oxygen  Device None    Sleep Oxygen Prescription None    Home Exercise Oxygen Prescription None    Home Resting Oxygen Prescription None    Compliance with Home Oxygen Use Yes      Initial 6 min Walk   Oxygen Used None      Program Oxygen Prescription   Program Oxygen Prescription None      Intervention   Short Term Goals To learn and understand importance of monitoring SPO2 with pulse oximeter and demonstrate accurate use of the pulse oximeter.;To learn and understand importance of maintaining oxygen saturations>88%;To learn and demonstrate proper pursed lip breathing techniques or other breathing techniques.     Long  Term Goals Verbalizes importance of monitoring SPO2 with pulse oximeter and return demonstration;Maintenance of O2 saturations>88%;Exhibits proper breathing techniques, such as pursed lip breathing or other method taught during program session;Compliance with respiratory medication             Oxygen Re-Evaluation:  Oxygen Re-Evaluation     Row Name 08/21/21 1018 10/02/21 1155 10/27/21 1122 11/21/21 1131       Program Oxygen Prescription   Program Oxygen Prescription -- None None None      Home Oxygen   Home Oxygen Device -- None None None    Sleep Oxygen Prescription -- None None None    Home Exercise Oxygen Prescription -- None None None    Home Resting Oxygen Prescription -- None None None    Compliance with Home Oxygen Use -- Yes Yes --      Goals/Expected Outcomes   Short Term Goals -- To learn and understand importance of monitoring SPO2 with pulse oximeter and demonstrate accurate use of the pulse oximeter.;To learn and understand importance of maintaining oxygen saturations>88%;To learn and demonstrate proper pursed lip breathing techniques or other breathing techniques.  To learn and understand importance of monitoring SPO2 with pulse oximeter and demonstrate accurate use of the pulse oximeter.;To learn and understand importance of maintaining oxygen  saturations>88%;To learn and demonstrate proper pursed lip breathing techniques or other breathing techniques.  To learn and understand importance of monitoring SPO2 with pulse oximeter and demonstrate accurate use of the pulse oximeter.;To learn and understand importance of maintaining oxygen saturations>88%;To learn and demonstrate proper pursed lip breathing techniques or other breathing techniques.     Long  Term Goals -- Verbalizes importance of monitoring SPO2 with pulse oximeter and return  demonstration;Maintenance of O2 saturations>88%;Exhibits proper breathing techniques, such as pursed lip breathing or other method taught during program session;Compliance with respiratory medication Verbalizes importance of monitoring SPO2 with pulse oximeter and return demonstration;Maintenance of O2 saturations>88%;Exhibits proper breathing techniques, such as pursed lip breathing or other method taught during program session;Compliance with respiratory medication Verbalizes importance of monitoring SPO2 with pulse oximeter and return demonstration;Maintenance of O2 saturations>88%;Exhibits proper breathing techniques, such as pursed lip breathing or other method taught during program session;Compliance with respiratory medication    Comments Reviewed PLB technique with pt.  Talked about how it works and it's importance in maintaining their exercise saturations. Discussed purchasing a pulse ox to monitor her heart rate and oxygen. She is checking her HR via her BP cuff, but not during exercise. She's walking to rehab without any assistance, but wants to work on balance exercises. Ivee is doing well in rehab. She is still getting winded walking and doing dishes.  She is good about using her PLB to help with exercise and especially with weights. She still has not gotten a pulse oximeter but just got paid so she will get one soon. Violanda is doing well in rehab. She does not have a pulse oximeter yet, but plans to purchase  soon.  She is doing well with her PLB and she uses it regularly.  She is feeling more confident with it as well.    Goals/Expected Outcomes Short: Become more profiecient at using PLB.   Long: Become independent at using PLB. ST: purchase a pulse ox. LT: continue to monitor HR and oxygen at home independently Short: Continue work on PLB with exercise Long: COnintue to manage breathing. Short: Continue to use PLB daily. Long: COntinue to improve breathing             Oxygen Discharge (Final Oxygen Re-Evaluation):  Oxygen Re-Evaluation - 11/21/21 1131       Program Oxygen Prescription   Program Oxygen Prescription None      Home Oxygen   Home Oxygen Device None    Sleep Oxygen Prescription None    Home Exercise Oxygen Prescription None    Home Resting Oxygen Prescription None      Goals/Expected Outcomes   Short Term Goals To learn and understand importance of monitoring SPO2 with pulse oximeter and demonstrate accurate use of the pulse oximeter.;To learn and understand importance of maintaining oxygen saturations>88%;To learn and demonstrate proper pursed lip breathing techniques or other breathing techniques.     Long  Term Goals Verbalizes importance of monitoring SPO2 with pulse oximeter and return demonstration;Maintenance of O2 saturations>88%;Exhibits proper breathing techniques, such as pursed lip breathing or other method taught during program session;Compliance with respiratory medication    Comments Dawson is doing well in rehab. She does not have a pulse oximeter yet, but plans to purchase soon.  She is doing well with her PLB and she uses it regularly.  She is feeling more confident with it as well.    Goals/Expected Outcomes Short: Continue to use PLB daily. Long: COntinue to improve breathing             Initial Exercise Prescription:  Initial Exercise Prescription - 08/17/21 1700       Date of Initial Exercise RX and Referring Provider   Date 08/17/21    Referring  Provider Ida Rogue MD      Oxygen   Maintain Oxygen Saturation 88% or higher      NuStep   Level 1  SPM 80    Minutes 15    METs 1.4      REL-XR   Level 1    Speed 50    Minutes 15    METs 1.4      T5 Nustep   Level 1    SPM 80    Minutes 15    METs 1.4      Track   Laps 7    Minutes 15    METs 1.38      Prescription Details   Frequency (times per week) 3    Duration Progress to 30 minutes of continuous aerobic without signs/symptoms of physical distress      Intensity   THRR 40-80% of Max Heartrate 103 - 142    Ratings of Perceived Exertion 11-13    Perceived Dyspnea 0-4      Progression   Progression Continue to progress workloads to maintain intensity without signs/symptoms of physical distress.      Resistance Training   Training Prescription Yes    Weight 4 lb    Reps 10-15             Perform Capillary Blood Glucose checks as needed.  Exercise Prescription Changes:   Exercise Prescription Changes     Row Name 08/17/21 1700 09/05/21 1100 09/21/21 1400 10/03/21 0800 10/17/21 1600     Response to Exercise   Blood Pressure (Admit) 140/70 144/98 130/70 134/64 122/76   Blood Pressure (Exercise) 154/78 130/70 -- 142/76 --   Blood Pressure (Exit) 138/78 126/70 124/78 124/64 118/64   Heart Rate (Admit) 65 bpm 76 bpm 99 bpm 96 bpm 92 bpm   Heart Rate (Exercise) 140 bpm 102 bpm 133 bpm 110 bpm 110 bpm   Heart Rate (Exit) 68 bpm 99 bpm 94 bpm 93 bpm 96 bpm   Oxygen Saturation (Admit) 96 % 96 % 96 % 97 % 96 %   Oxygen Saturation (Exercise) 98 % 95 % 97 % 93 % 93 %   Oxygen Saturation (Exit) 97 % 96 % 96 % 96 % 95 %   Rating of Perceived Exertion (Exercise) _0 Perceived Dyspnea (Exercise) _1 -- 1   Symptoms SOB SOB none none none   Comments walk test results -- -- -- --   Duration -- Continue with 30 min of aerobic exercise without signs/symptoms of physical distress. Continue with 30 min of aerobic exercise without  signs/symptoms of physical distress. Continue with 30 min of aerobic exercise without signs/symptoms of physical distress. Continue with 30 min of aerobic exercise without signs/symptoms of physical distress.   Intensity -- THRR unchanged THRR unchanged THRR unchanged THRR unchanged     Progression   Progression -- Continue to progress workloads to maintain intensity without signs/symptoms of physical distress. Continue to progress workloads to maintain intensity without signs/symptoms of physical distress. Continue to progress workloads to maintain intensity without signs/symptoms of physical distress. Continue to progress workloads to maintain intensity without signs/symptoms of physical distress.   Average METs -- 1.45 1.58 1.54 1.92     Resistance Training   Training Prescription -- Yes Yes Yes Yes   Weight -- 4 lb 4 lb 4 lb 4 lb   Reps -- 10-15 10-15 10-15 10-15     Interval Training   Interval Training -- No No No No     Treadmill   MPH -- -- 1.1 1.2 --   Grade -- -- 0 0 --  Minutes -- -- 15 15 --   METs -- -- 1.8 1.92 --     Arm Ergometer   Level -- 1 -- -- --   Minutes -- 15 -- -- --   METs -- 1.7 -- -- --     REL-XR   Level -- _0 Minutes -- _1 METs -- 1.2 1.2 1.1 --     T5 Nustep   Level -- -- 1 -- 2   Minutes -- -- 15 -- 30   METs -- -- 1.5 -- 2     Track   Laps -- -- 15 11 --   Minutes -- -- 15 15 --   METs -- -- 1.76 1.6 --     Home Exercise Plan   Plans to continue exercise at -- -- -- -- Home (comment)  walking; baseball field   Frequency -- -- -- -- Add 2 additional days to program exercise sessions.   Initial Home Exercises Provided -- -- -- -- 10/02/21     Oxygen   Maintain Oxygen Saturation -- 88% or higher 88% or higher 88% or higher 88% or higher    Row Name 10/30/21 1500 11/13/21 1500 11/21/21 1100 11/29/21 1100       Response to Exercise   Blood Pressure (Admit) 124/72 122/78 -- 108/72    Blood Pressure (Exit) 136/70  116/72 -- 102/66    Heart Rate (Admit) 99 bpm 94 bpm -- 93 bpm    Heart Rate (Exercise) 110 bpm 108 bpm -- 127 bpm    Heart Rate (Exit) 103 bpm 98 bpm -- 107 bpm    Oxygen Saturation (Admit) 95 % 98 % -- 97 %    Oxygen Saturation (Exercise) 94 % 98 % -- 95 %    Oxygen Saturation (Exit) 97 % 98 % -- 98 %    Rating of Perceived Exertion (Exercise) 13 12 -- 13    Perceived Dyspnea (Exercise) 1 1 -- 2    Symptoms none none -- SOB    Duration Continue with 30 min of aerobic exercise without signs/symptoms of physical distress. Continue with 30 min of aerobic exercise without signs/symptoms of physical distress. -- Continue with 30 min of aerobic exercise without signs/symptoms of physical distress.    Intensity THRR unchanged THRR unchanged -- THRR unchanged      Progression   Progression Continue to progress workloads to maintain intensity without signs/symptoms of physical distress. Continue to progress workloads to maintain intensity without signs/symptoms of physical distress. -- Continue to progress workloads to maintain intensity without signs/symptoms of physical distress.    Average METs 2 3.4 -- 2.48      Resistance Training   Training Prescription Yes Yes -- Yes    Weight 4 lb 4 lb -- 4 lb    Reps 10-15 10-15 -- 10-15      Interval Training   Interval Training No No -- No      Treadmill   MPH -- -- -- 1.2    Grade -- -- -- 0    Minutes -- -- -- 15    METs -- -- -- 1.92      REL-XR   Level 2 5 -- 4    Minutes 15 15 -- 30    METs 1.3 3.4 -- 2.3      T5 Nustep   Level 3 -- -- --    Minutes 30 -- -- --  METs 2.6 -- -- --      Home Exercise Plan   Plans to continue exercise at Home (comment)  walking; baseball field Home (comment)  walking; baseball field Home (comment)  walking; PT exercises Home (comment)  walking; PT exercises    Frequency Add 2 additional days to program exercise sessions. Add 2 additional days to program exercise sessions. Add 2 additional days to  program exercise sessions. Add 2 additional days to program exercise sessions.    Initial Home Exercises Provided 10/02/21 10/02/21 11/21/21 11/21/21      Oxygen   Maintain Oxygen Saturation 88% or higher 88% or higher 88% or higher 88% or higher             Exercise Comments:   Exercise Goals and Review:   Exercise Goals     Row Name 08/17/21 1719             Exercise Goals   Increase Physical Activity Yes       Intervention Provide advice, education, support and counseling about physical activity/exercise needs.;Develop an individualized exercise prescription for aerobic and resistive training based on initial evaluation findings, risk stratification, comorbidities and participant's personal goals.       Expected Outcomes Short Term: Attend rehab on a regular basis to increase amount of physical activity.;Long Term: Add in home exercise to make exercise part of routine and to increase amount of physical activity.;Long Term: Exercising regularly at least 3-5 days a week.       Increase Strength and Stamina Yes       Intervention Provide advice, education, support and counseling about physical activity/exercise needs.;Develop an individualized exercise prescription for aerobic and resistive training based on initial evaluation findings, risk stratification, comorbidities and participant's personal goals.       Expected Outcomes Short Term: Increase workloads from initial exercise prescription for resistance, speed, and METs.;Short Term: Perform resistance training exercises routinely during rehab and add in resistance training at home;Long Term: Improve cardiorespiratory fitness, muscular endurance and strength as measured by increased METs and functional capacity (6MWT)       Able to understand and use rate of perceived exertion (RPE) scale Yes       Intervention Provide education and explanation on how to use RPE scale       Expected Outcomes Short Term: Able to use RPE daily in  rehab to express subjective intensity level;Long Term:  Able to use RPE to guide intensity level when exercising independently       Able to understand and use Dyspnea scale Yes       Intervention Provide education and explanation on how to use Dyspnea scale       Expected Outcomes Short Term: Able to use Dyspnea scale daily in rehab to express subjective sense of shortness of breath during exertion;Long Term: Able to use Dyspnea scale to guide intensity level when exercising independently       Knowledge and understanding of Target Heart Rate Range (THRR) Yes       Intervention Provide education and explanation of THRR including how the numbers were predicted and where they are located for reference       Expected Outcomes Short Term: Able to state/look up THRR;Short Term: Able to use daily as guideline for intensity in rehab;Long Term: Able to use THRR to govern intensity when exercising independently       Able to check pulse independently Yes       Intervention Provide education and  demonstration on how to check pulse in carotid and radial arteries.;Review the importance of being able to check your own pulse for safety during independent exercise       Expected Outcomes Short Term: Able to explain why pulse checking is important during independent exercise;Long Term: Able to check pulse independently and accurately       Understanding of Exercise Prescription Yes       Intervention Provide education, explanation, and written materials on patient's individual exercise prescription       Expected Outcomes Short Term: Able to explain program exercise prescription;Long Term: Able to explain home exercise prescription to exercise independently                Exercise Goals Re-Evaluation :  Exercise Goals Re-Evaluation     Row Name 08/21/21 1015 09/05/21 1129 09/11/21 1151 09/21/21 1419 10/02/21 1110     Exercise Goal Re-Evaluation   Exercise Goals Review Increase Physical Activity;Able to  understand and use rate of perceived exertion (RPE) scale;Knowledge and understanding of Target Heart Rate Range (THRR);Understanding of Exercise Prescription;Increase Strength and Stamina;Able to check pulse independently;Able to understand and use Dyspnea scale Increase Physical Activity;Increase Strength and Stamina;Understanding of Exercise Prescription Increase Physical Activity;Increase Strength and Stamina;Understanding of Exercise Prescription Increase Physical Activity;Increase Strength and Stamina;Understanding of Exercise Prescription Increase Physical Activity;Increase Strength and Stamina;Understanding of Exercise Prescription   Comments Reviewed RPE and dyspnea scales, THR and program prescription with pt today.  Pt voiced understanding and was given a copy of goals to take home. Jarrod is off to a good start in rehab.  She has completed her first four days of exercise thus far.  She is now going for the 30 minutes.  We will continue to monitor her progress. Janelli is walking at home around a track for ~1 hour with her husband. She has not gone over home exercise with EP. Sharren is doing well in rehab. She increased her laps on the track from 7 to 15 in total. She also moved up to level 3 on the XR. She tried out the treadmill for the first time at 1.1 mph speed which about the same speed as her track. We hope to see that improve over time. Will continue to monitor. Marcene Brawn, [place], reviewed home exercise with pt today.  Pt is walking 30-45 min at a baseball field and breaking during laps. Pt is walking 5-6 days per week. Pt plans to continue walking at the baseball field for exercise.  Reviewed THR, pulse, RPE, sign and symptoms, pulse oximetery and when to call 911 or MD.  Also discussed weather considerations and indoor options.  Pt voiced understanding. Pt requested explanation of METs and assistance with balance exercise; balance printout and staff youtube video information provided.   Expected Outcomes  Short: Use RPE daily to regulate intensity. Long: Follow program prescription in THR. Short: Continue to attend rehab regularly Long: Continue to follow program prescription ST: EP to go over home exercise LT: Continue to follow program prescription Short: Increase speed on treadmill Long: Continue to increase overall MET level Short: minimize breaks during walking exercise Long: continue exercising independently at home    Row Name 10/03/21 0832 10/17/21 1656 10/27/21 1106 10/30/21 1519 11/13/21 1532     Exercise Goal Re-Evaluation   Exercise Goals Review Increase Physical Activity;Increase Strength and Stamina;Understanding of Exercise Prescription Increase Physical Activity;Increase Strength and Stamina;Understanding of Exercise Prescription Increase Physical Activity;Increase Strength and Stamina;Understanding of Exercise Prescription Increase Physical Activity;Increase Strength and  Stamina;Understanding of Exercise Prescription Increase Physical Activity;Increase Strength and Stamina;Understanding of Exercise Prescription   Comments Sherrita is doing well in rehab. She increased her speed on the treadmill to 1.2 mph. She has also tolerated using 4 lb hand weights for resistance training. She could benefit from pushing it to do more laps on the track. We will continue to monitor her progress in the program. Bethany continues to do well in rehab. She did not walk at all since last review. She was able to increase to level 2 on the T5 Nustep. Her level on the XR is inconsistent and would  benefit from bringing that back up.  Oxygen is staying above 88%. Will continue to monitor. Braelyn is doing well in rehab.  She has been stretching and PT exercises in addition to what we have been doing in class.  She is walking at the baseball field 90 ft at a time. She is getting more strength back, but not feeling like her stamina is getting better.  She wants to go harder, but limited by walking.  She is looking into [employer] so that she can use some seated equipment.  We talked about picking the one that she is more likely to go to.  When knows that when she feels progress, she is more motivated to go. We also talked about using the staff videos on bad weather days. Loriis doing well in rehab. She recently improved to level 3 on the T5 machine. She also improved her overall average METs to 2 METs. She has also tolerated 4 lb hand weights for resistance training. We will continue to monitor her progress in the program. Marye has only attended rehab once since the last evaluation. She did improve to level 5 on the XR machine. She has also tolerated using 4 lb hand weights for resistance training. We will continue to monitor her progress in the program.   Expected Outcomes Short: Continue to push for more laps on the track. Long: Continue to increase stamina. Short: Increase XR level back up and get back to walking Long: Continue to increase overall MET level Short: Continue to walk on off days Long: Conitnue to improve stamina Short: Continue to follow exercise prescription. Long: Conitnue to improve strength and stamina. Short: Continue to attend rehab regularly. Long: Continue to increase overall MET levels.    [place] Name 11/21/21 1120 11/29/21 1157           Exercise Goal Re-Evaluation   Exercise Goals Review Increase Physical Activity;Increase Strength and Stamina;Understanding of Exercise Prescription;Able to understand and use rate of perceived exertion (RPE) scale;Knowledge and understanding of Target Heart Rate Range (THRR);Able to understand and use Dyspnea scale;Able to check pulse independently Increase Physical Activity;Increase Strength and Stamina;Understanding of Exercise Prescription      Comments Reviewed home exercise with pt today.  Pt plans to walk and use staff videos at home for exercise.  She also has PT exercises.  Reviewed THR, pulse, RPE, sign and symptoms, pulse oximetery and when to  call 911 or MD.  Also discussed weather considerations and indoor options.  Pt voiced understanding.  Durga does not have a pulse oximeter at home, but plans to get one. She also wants to work on her balance. Tinzlee is doing well in rehab. She improved back to level 4 on the XR. She also had an average overall MET level of 2.48 METs. She has tolerated using 4 lb hand weights for resistance training as  well.      Expected Outcomes Short: Start to add in more exercise at home Long: conitnue to improve stamina Short: Try level 5 on the XR. Long: Continue to improve strength and stamina.               Discharge Exercise Prescription (Final Exercise Prescription Changes):  Exercise Prescription Changes - 11/29/21 1100       Response to Exercise   Blood Pressure (Admit) 108/72    Blood Pressure (Exit) 102/66    Heart Rate (Admit) 93 bpm    Heart Rate (Exercise) 127 bpm    Heart Rate (Exit) 107 bpm    Oxygen Saturation (Admit) 97 %    Oxygen Saturation (Exercise) 95 %    Oxygen Saturation (Exit) 98 %    Rating of Perceived Exertion (Exercise) 13    Perceived Dyspnea (Exercise) 2    Symptoms SOB    Duration Continue with 30 min of aerobic exercise without signs/symptoms of physical distress.    Intensity THRR unchanged      Progression   Progression Continue to progress workloads to maintain intensity without signs/symptoms of physical distress.    Average METs 2.48      Resistance Training   Training Prescription Yes    Weight 4 lb    Reps 10-15      Interval Training   Interval Training No      Treadmill   MPH 1.2    Grade 0    Minutes 15    METs 1.92      REL-XR   Level 4    Minutes 30    METs 2.3      Home Exercise Plan   Plans to continue exercise at Home (comment)   walking; PT exercises   Frequency Add 2 additional days to program exercise sessions.    Initial Home Exercises Provided 11/21/21      Oxygen   Maintain Oxygen Saturation 88% or higher              Nutrition:  Target Goals: Understanding of nutrition guidelines, daily intake of sodium <1542m, cholesterol <2071m calories 30% from fat and 7% or less from saturated fats, daily to have 5 or more servings of fruits and vegetables.  Education: All About Nutrition: -Group instruction provided by verbal, written material, interactive activities, discussions, models, and posters to present general guidelines for heart healthy nutrition including fat, fiber, MyPlate, the role of sodium in heart healthy nutrition, utilization of the nutrition label, and utilization of this knowledge for meal planning. Follow up email sent as well. Written material given at graduation.   Biometrics:  Pre Biometrics - 08/17/21 1716       Pre Biometrics   Height 5' 7.25" (1.708 m)    Weight 295 lb 11.2 oz (134.1 kg)    BMI (Calculated) 45.98    Single Leg Stand 2.3 seconds              Nutrition Therapy Plan and Nutrition Goals:  Nutrition Therapy & Goals - 09/11/21 1135       Nutrition Therapy   RD appointment deferred Yes   Seeing RD s/p bariatric surgery - she has her 6 month appointment soon. Will defer nutrition until 6 month bariatric surgery RD appointment.     Personal Nutrition Goals   Nutrition Goal Seeing RD s/p bariatric surgery - she has her 6 month appointment soon. Will defer nutrition until 6 month bariatric surgery RD appointment.  Nutrition Assessments:  MEDIFICTS Score Key: ?70 Need to make dietary changes  40-70 Heart Healthy Diet ? 40 Therapeutic Level Cholesterol Diet  Flowsheet Row Pulmonary Rehab from 08/17/2021 in [facility] Cardiac [facility] Pul[facility] Rehab  Picture Your Plate Total Score on Admission 64      Picture Your Plate Scores: <10 Unhealthy dietary pattern with much room for improvement. 41-50 Dietary pattern unlikely to meet recommendations for good health and room for improvement. 51-60 More healthful dietary pattern, with some room for  improvement.  >60 Healthy dietary pattern, although there may be some specific behaviors that could be improved.   Nutrition Goals Re-Evaluation:  Nutrition Goals Re-Evaluation     [place] Name 10/02/21 1139 10/27/21 1117 11/21/21 1130         Goals   Current Weight 262 lb (118.8 kg) -- --     Nutrition Goal Continues to defer RD appointment. Continues to defer RD appointment. Continues to defer RD appointment.     Comment Yadhira states she is working with her bariatric doctor's office for her nutritional needs. She currently defers a Counselling psychologist. Aubriauna is doing well with her diet.  She does feel fatigued when she eats and will talk to dietitian about this. Mary-Anne continues to work with her bariatric dietitian already.  She is following her diet and doing well.  She continues to get a good variety.     Expected Outcome ST: continue meeting with bariatric diet plan LT: weigh less than 200 lbs. ST: continue meeting with bariatric diet plan LT: weigh less than 200 lbs. ST: continue meeting with bariatric diet plan LT: weigh less than 200 lbs.              Nutrition Goals Discharge (Final Nutrition Goals Re-Evaluation):  Nutrition Goals Re-Evaluation - 11/21/21 1130       Goals   Nutrition Goal Continues to defer RD appointment.    Comment Mylinda continues to work with her bariatric dietitian already.  She is following her diet and doing well.  She continues to get a good variety.    Expected Outcome ST: continue meeting with bariatric diet plan LT: weigh less than 200 lbs.             Psychosocial: Target Goals: Acknowledge presence or absence of significant depression and/or stress, maximize coping skills, provide positive support system. Participant is able to verbalize types and ability to use techniques and skills needed for reducing stress and depression.   Education: Stress, Anxiety, and Depression - Group verbal and visual presentation to define topics covered.  Reviews how  body is impacted by stress, anxiety, and depression.  Also discusses healthy ways to reduce stress and to treat/manage anxiety and depression.  Written material given at graduation.   Education: Sleep Hygiene -Provides group verbal and written instruction about how sleep can affect your health.  Define sleep hygiene, discuss sleep cycles and impact of sleep habits. Review good sleep hygiene tips.    Initial Review & Psychosocial Screening:  Initial Psych Review & Screening - 10/02/21 1133       Initial Review   Current issues with Current Depression;Current Psychotropic Meds;History of Depression;Current Stress Concerns;Current Sleep Concerns             Quality of Life Scores:  Scores of 19 and below usually indicate a poorer quality of life in these areas.  A difference of  2-3 points is a clinically meaningful difference.  A difference of 2-3 points in the  total score of the Quality of Life Index has been associated with significant improvement in overall quality of life, self-image, physical symptoms, and general health in studies assessing change in quality of life.  PHQ-9: Review Flowsheet  More data exists      11/21/2021 11/13/2021 10/27/2021 10/13/2021 09/11/2021  Depression screen PHQ 2/9  Decreased Interest _0 Down, Depressed, Hopeless _1 PHQ - 2 Score _2 Altered sleeping _3 Tired, decreased energy _4 Change in appetite 0 0 0 0 0  Feeling bad or failure about yourself  _5 Trouble concentrating _6 Moving slowly or fidgety/restless 0 _7 Suicidal thoughts 0 0 0 - 0  PHQ-9 Score _8 Difficult doing work/chores Very difficult Very difficult Somewhat difficult Somewhat difficult Somewhat difficult   Interpretation of Total Score  Total Score Depression Severity:  1-4 = Minimal depression, 5-9 = Mild depression, 10-14 = Moderate depression, 15-19 = Moderately severe depression, 20-27 = Severe  depression   Psychosocial Evaluation and Intervention:  Psychosocial Evaluation - 08/14/21 1129       Psychosocial Evaluation & Interventions   Interventions Stress management education;Encouraged to exercise with the program and follow exercise prescription    Comments She is out of work with a sever autistic son. She is generally negative and is trying to be more positive. She talks to a therapist and takes medsication to help her cope better. She is determined to exercise, get weight off and be more positive.    Expected Outcomes Short: Start [facility] to help with mood. Long: Maintain a healthy mental state.    Continue Psychosocial Services  Follow up required by staff             Psychosocial Re-Evaluation:  Psychosocial Re-Evaluation     [place] Name 09/11/21 1137 10/02/21 1133 10/27/21 1112 11/21/21 1121       Psychosocial Re-Evaluation   Current issues with Current Depression;Current Anxiety/Panic;Current Sleep Concerns;Current Psychotropic Meds Current Depression;Current Anxiety/Panic;Current Sleep Concerns;Current Psychotropic Meds;Current Stress Concerns Current Depression;Current Anxiety/Panic;Current Sleep Concerns;Current Psychotropic Meds;Current Stress Concerns Current Depression;Current Anxiety/Panic;Current Sleep Concerns;Current Psychotropic Meds;Current Stress Concerns    Comments Timothea reports doing well with her mental health and is now on medication as well as seeing a therapist 1x/week. She relies on her husband, best friend, therapist, and doctors for support. She reports walking, driving in nice weather, and staying busy to reduce stressors. She is not sleeping well right now due to stomach pain - she is working with her MD on this. Vertie states that therapy has been good and she continues to go. She is compliant with her medication and states they are effective. Sleep has improved, but she desires for continued improvement. She feels the biggest barrier to sleeping  well is caring for her special-needs son to monitor his behavior. Her husband is supportive and helps in caring for the son and other duties. Stress: her daughter's car is broke-down now and they is helping her with transportation of her to work and her child to school. Managing stress with exercise and talking with her husband. Glendene is doing well in rehab.  Her PHQ has improve 3 points over the last two weeks.  She is feeling better and moving more.  She is still struggling with her sleep.  When she eats she gets very fatigued and has to rest.  She continues to care for her son who is ready to go on vacation.  He has been having mostly good days, but will sneak into kitchen to get into things. Cathryne is doing well in rehab. Her PHQ score has shifted around, but score remains the same.  She has her usual issues with her family and son getting out the door.  Her son has a harder time with things in morning.  Her finances got mixed up this month and they are short.  Her balance seems to be getting worse which she finds frustrating.  She enjoys exercise and knows that it makes her feel better.  She knows that she needs to focus and be displined.    Expected Outcomes ST: continue to do stress reducing activites  LT: maintain positive attitude ST: continue to attend rehab, exercise at home, and attend therapy. LT: continue to maintain positive attitude and utilize exercise for stress management Short: Continue to exercise for mental outlet and boost Long: Continue to focus on positive Short: Make time to exercise and take care of herself LOng: continue to focus on the successes    Interventions Stress management education;Encouraged to attend Cardiac Rehabilitation for the exercise Stress management education;Encouraged to attend Pulmonary Rehabilitation for the exercise Stress management education;Encouraged to attend Pulmonary Rehabilitation for the exercise Stress management education;Encouraged to attend Pulmonary  Rehabilitation for the exercise    Continue Psychosocial Services  Follow up required by staff Follow up required by staff Follow up required by staff Follow up required by staff      Initial Review   Source of Stress Concerns -- Chronic Illness;Occupation;Family -- Chronic Illness;Occupation;Family    Comments -- She continues to care for her son and help her other children. She seems positive with her current situation and reaches out for help as needed. She is taking her medication and seeing her therapist. -- --             Psychosocial Discharge (Final Psychosocial Re-Evaluation):  Psychosocial Re-Evaluation - 11/21/21 1121       Psychosocial Re-Evaluation   Current issues with Current Depression;Current Anxiety/Panic;Current Sleep Concerns;Current Psychotropic Meds;Current Stress Concerns    Comments Airika is doing well in rehab. Her PHQ score has shifted around, but score remains the same.  She has her usual issues with her family and son getting out the door.  Her son has a harder time with things in morning.  Her finances got mixed up this month and they are short.  Her balance seems to be getting worse which she finds frustrating.  She enjoys exercise and knows that it makes her feel better.  She knows that she needs to focus and be displined.    Expected Outcomes Short: Make time to exercise and take care of herself LOng: continue to focus on the successes    Interventions Stress management education;Encouraged to attend Pulmonary Rehabilitation for the exercise    Continue Psychosocial Services  Follow up required by staff      Initial Review   Source of Stress Concerns Chronic Illness;Occupation;Family             Education: Education Goals: Education classes will be provided on a weekly basis, covering required topics. Participant will state understanding/return demonstration of topics presented.  Learning Barriers/Preferences:  Learning Barriers/Preferences -  08/14/21 1123       Learning Barriers/Preferences   Learning Barriers Sight  Learning Preferences Skilled Demonstration;Video             General Pulmonary Education Topics:  Infection Prevention: - Provides verbal and written material to individual with discussion of infection control including proper hand washing and proper equipment cleaning during exercise session. Flowsheet Row Pulmonary Rehab from 08/14/2021 in [facility] Cardiac and Pulmonary Rehab  Date 08/14/21  Educator [facility]  Instruction Review Code 1- Verbalizes Understanding       Falls Prevention: - Provides verbal and written material to individual with discussion of falls prevention and safety. Flowsheet Row Pulmonary Rehab from 08/14/2021 in [facility] Cardiac and Pulmonary Rehab  Date 08/14/21  Educator [facility]  Instruction Review Code 1- Verbalizes Understanding       Chronic Lung Disease Review: - Group verbal instruction with posters, models, PowerPoint presentations and videos,  to review new updates, new respiratory medications, new advancements in procedures and treatments. Providing information on websites and "800" numbers for continued self-education. Includes information about supplement oxygen, available portable oxygen systems, continuous and intermittent flow rates, oxygen safety, concentrators, and Medicare reimbursement for oxygen. Explanation of Pulmonary Drugs, including class, frequency, complications, importance of spacers, rinsing mouth after steroid MDI's, and proper cleaning methods for nebulizers. Review of basic lung anatomy and physiology related to function, structure, and complications of lung disease. Review of risk factors. Discussion about methods for diagnosing sleep apnea and types of masks and machines for OSA. Includes a review of the use of types of environmental controls: home humidity, furnaces, filters, dust mite/pet prevention, HEPA vacuums. Discussion about weather changes, air quality and the  benefits of nasal washing. Instruction on Warning signs, infection symptoms, calling MD promptly, preventive modes, and value of vaccinations. Review of effective airway clearance, coughing and/or vibration techniques. Emphasizing that all should Create an Action Plan. Written material given at graduation. Flowsheet Row Pulmonary Rehab from 08/17/2021 in [facility] Cardiac and Pulmonary Rehab  Education need identified 08/17/21       AED/CPR: - Group verbal and written instruction with the use of models to demonstrate the basic use of the AED with the basic ABC's of resuscitation.    Anatomy and Cardiac Procedures: - Group verbal and visual presentation and models provide information about basic cardiac anatomy and function. Reviews the testing methods done to diagnose heart disease and the outcomes of the test results. Describes the treatment choices: Medical Management, Angioplasty, or Coronary Bypass Surgery for treating various heart conditions including Myocardial Infarction, Angina, Valve Disease, and Cardiac Arrhythmias.  Written material given at graduation.   Medication Safety: - Group verbal and visual instruction to review commonly prescribed medications for heart and lung disease. Reviews the medication, class of the drug, and side effects. Includes the steps to properly store meds and maintain the prescription regimen.  Written material given at graduation.   Other: -Provides group and verbal instruction on various topics (see comments)   Knowledge Questionnaire Score:  Knowledge Questionnaire Score - 08/17/21 1702       Knowledge Questionnaire Score   Pre Score 16/18: Exercise, Oxygen              Core Components/Risk Factors/Patient Goals at Admission:  Personal Goals and Risk Factors at Admission - 08/17/21 1720       Core Components/Risk Factors/Patient Goals on Admission    Weight Management Yes;Obesity;Weight Loss    Intervention Weight Management: Develop a  combined nutrition and exercise program designed to reach desired caloric intake, while maintaining appropriate intake of nutrient and fiber, sodium  and fats, and appropriate energy expenditure required for the weight goal.;Weight Management: Provide education and appropriate resources to help participant work on and attain dietary goals.;Weight Management/Obesity: Establish reasonable short term and long term weight goals.;Obesity: Provide education and appropriate resources to help participant work on and attain dietary goals.    Admit Weight 295 lb (133.8 kg)    Goal Weight: Short Term 290 lb (131.5 kg)    Goal Weight: Long Term 240 lb (108.9 kg)    Expected Outcomes Short Term: Continue to assess and modify interventions until short term weight is achieved;Long Term: Adherence to nutrition and physical activity/exercise program aimed toward attainment of established weight goal;Weight Loss: Understanding of general recommendations for a balanced deficit meal plan, which promotes 1-2 lb weight loss per week and includes a negative energy balance of [phone] kcal/d;Understanding recommendations for meals to include 15-35% energy as protein, 25-35% energy from fat, 35-60% energy from carbohydrates, less than 252m of dietary cholesterol, 20-35 gm of total fiber daily;Understanding of distribution of calorie intake throughout the day with the consumption of 4-5 meals/snacks    Improve shortness of breath with ADL's Yes    Intervention Provide education, individualized exercise plan and daily activity instruction to help decrease symptoms of SOB with activities of daily living.    Expected Outcomes Short Term: Improve cardiorespiratory fitness to achieve a reduction of symptoms when performing ADLs;Long Term: Be able to perform more ADLs without symptoms or delay the onset of symptoms    Diabetes Yes    Intervention Provide education about signs/symptoms and action to take for hypo/hyperglycemia.;Provide  education about proper nutrition, including hydration, and aerobic/resistive exercise prescription along with prescribed medications to achieve blood glucose in normal ranges: Fasting glucose 65-99 mg/dL    Expected Outcomes Short Term: Participant verbalizes understanding of the signs/symptoms and immediate care of hyper/hypoglycemia, proper foot care and importance of medication, aerobic/resistive exercise and nutrition plan for blood glucose control.;Long Term: Attainment of HbA1C < 7%.    Hypertension Yes    Intervention Monitor prescription use compliance.;Provide education on lifestyle modifcations including regular physical activity/exercise, weight management, moderate sodium restriction and increased consumption of fresh fruit, vegetables, and low fat dairy, alcohol moderation, and smoking cessation.    Expected Outcomes Short Term: Continued assessment and intervention until BP is < 140/939mHG in hypertensive participants. < 130/8033mG in hypertensive participants with diabetes, heart failure or chronic kidney disease.;Long Term: Maintenance of blood pressure at goal levels.    Lipids Yes    Intervention Provide education and support for participant on nutrition & aerobic/resistive exercise along with prescribed medications to achieve LDL <81m54mDL >40mg10m Expected Outcomes Short Term: Participant states understanding of desired cholesterol values and is compliant with medications prescribed. Participant is following exercise prescription and nutrition guidelines.;Long Term: Cholesterol controlled with medications as prescribed, with individualized exercise RX and with personalized nutrition plan. Value goals: LDL < 81mg,30m > 40 mg.             Education:Diabetes - Individual verbal and written instruction to review signs/symptoms of diabetes, desired ranges of glucose level fasting, after meals and with exercise. Acknowledge that pre and post exercise glucose checks will be done for  3 sessions at entry of program. Flowsheet Row [facility]  Date 08/14/21  [facility]- Verbalizes Understanding       Know Your Numbers and Heart Failure: - Group verbal  and visual instruction to discuss disease risk factors for cardiac and pulmonary disease and treatment options.  Reviews associated critical values for Overweight/Obesity, Hypertension, Cholesterol, and Diabetes.  Discusses basics of heart failure: signs/symptoms and treatments.  Introduces Heart Failure Zone chart for action plan for heart failure.  Written material given at graduation.   Core Components/Risk Factors/Patient Goals Review:   Goals and Risk Factor Review     Row Name 09/11/21 1141 10/02/21 1122 10/27/21 1118 11/21/21 1127       Core Components/Risk Factors/Patient Goals Review   Personal Goals Review Weight Management/Obesity;Diabetes Weight Management/Obesity;Hypertension;Diabetes;Improve shortness of breath with ADL's Weight Management/Obesity;Hypertension;Diabetes;Improve shortness of breath with ADL's;Increase knowledge of respiratory medications and ability to use respiratory devices properly. 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 She reports having stomach pain which is making it hard to tolerate food and her medications; she is unable to take her gastric bypass vitamins - her doctor suggested taking 2 MVI per day in the interim. her doctor is going to order a CT to investigate causes. She continues to check her BG at home in the monring - running in the 90s; her last A1C is now 7%, she is due for a new one soon. She is dealing with muscle soreness from moving her body more due to rehab and exercise at home - she takes tylenol for this. Kazoua continues to lose weight since the bypass surgery. She  is currently at 262 lb per her home scales. Her goal is to lose below 200 lbs. She states that weight loss is sporadic, losing larger amounts then plateauing. She understands that this is related to the surgery. She is having an upper GI on 6/6 to determine if she is having a food intolerance. Diabetes: Brittony says her sugars have been good but they are creeping up and has an appointment scheduled this week. She has been drinking orange juice and thinks that is related, but has been diluting the orange juice with water to reduce the sugar level and having crystal light. She isn't currently on any diabetic medication and her average BG level is 90-100 fasting. She will discuss this further with her physician to determine their goals for her. ADLs: Pt says it's easier to stand for longer periods of time with less sitting breaks to wash dishes. Jameelah has been walking to rehab and finds it easier to get up from the couch. However, she has some instability/balance concerns and was encouraged to use assistance to stabilize self. She states her husband is mindful and supportive of her during her instability. She will be discussing this concern with her physician on Thursday, 6/7. Hypertension: Rosann states her blood pressure has remained stable. Justyce is doing well in rehab. She continues to work on weight loss is down to 258.  She is doing well with her sugar control and has noticed that orange juice was driving it up, so she cut back. She is doing well with her breathing and is trying to do more at home.  She still gets winded walking but better on seated on equipment.  Her pressures have continued to be good. [name] is doing well in rehab. She continues to lose weight, but wants to maintain her muscular strength.  Her pressures are doing well and she continues to check them at home.  She is doing well with her medications.  She is off her  metformin as her A1c has gotten down to 6.1!!  She is getting better with her breathing.     Expected Outcomes ST: get CT and f/u with doctor for stomach pain LT: continue to manage risk factors ST: continue to monitor blood sugar and talk with PCP about blood sugars and instability;  LT: continue to manage lifestyle risk factors Short: Continue to build up breathing Long: COnitnue to work on weight loss Short: Continue to work on exercise and strength Long: Continue to work on [employer] loss             Core Components/Risk Factors/Patient Goals at Discharge (Final Review):   Goals and Risk Factor Review - 11/21/21 1127       Core Components/Risk Factors/Patient Goals Review   Personal Goals Review Weight Management/Obesity;Hypertension;Diabetes;Improve shortness of breath with ADL's    Review Quinn is doing well in rehab. She continues to lose weight, but wants to maintain her muscular strength.  Her pressures are doing well and she continues to check them at home.  She is doing well with her medications.  She is off her metformin as her A1c has gotten down to 6.1!!  She is getting better with her breathing.    Expected Outcomes Short: Continue to work on exercise and strength Long: Continue to work on weight loss             ITP Comments:  ITP Comments     Row Name 08/14/21 1127 08/17/21 1700 08/21/21 1015 09/13/21 0922 10/11/21 1355   ITP Comments Virtual Visit completed. Patient informed on EP and RD appointment and 6 Minute walk test. Patient also informed of patient health questionnaires on My Chart. Patient Verbalizes understanding. Visit diagnosis can be found in [facility] 04/27/[facility]22. Completed 6MWT and gym orientation. Initial ITP created and sent for review to Dr. Ottie Glazier,  Medical Director. First full day of exercise!  Patient was oriented to gym and equipment including functions, settings, policies, and procedures.  Patient's individual exercise prescription and treatment plan were reviewed.  All starting workloads were established based on the results of the 6 minute  walk test done at initial orientation visit.  The plan for exercise progression was also introduced and progression will be customized based on patient's performance and goals. 30 Day review completed. Medical Director ITP review done, changes made as directed, and signed approval by Medical Director. 30 Day review completed. Medical Director ITP review done, changes made as directed, and signed approval by Medical Director.    [place] Name 11/08/21 1034 12/06/21 0959         ITP Comments 30 Day review completed. Medical Director ITP review done, changes made as directed, and signed approval by Medical Director. 30 Day review completed. Medical Director ITP review done, changes made as directed, and signed approval by Medical Director.               Comments:

## 2021-12-06 NOTE — Progress Notes (Signed)
Daily Session Note  Patient Details  Name: Brandy [name] MRN: [id number] Date of Birth: [birth year] Referring Provider:   Flowsheet Row Pulmonary Rehab from 08/17/2021 in [facility] Cardiac and Pulmonary Rehab  Referring Provider Ida Rogue MD       Encounter Date: 12/06/2021  Check In:  Session Check In - 12/06/21 1433       Check-In   Supervising physician immediately available to respond to emergencies See telemetry face sheet for immediately available ER MD    Location [facility]-Cardiac & Pulmonary Rehab    Staff Present Antionette Fairy, BS, Exercise Physiologist;Kara Eliezer Bottom, MS, ASCM CEP, Exercise Physiologist;Susanne Bice, RN, BSN, CCRP;Laureen Owens Shark, BS, RRT, CPFT    Virtual Visit No    Medication changes reported     No    Fall or balance concerns reported    No    Warm-up and Cool-down Performed on first and last piece of equipment    Resistance Training Performed Yes    VAD Patient? No    PAD/SET Patient? No      Pain Assessment   Currently in Pain? No/denies    Multiple Pain Sites No                Social History   Tobacco Use  Smoking Status Never  Smokeless Tobacco Never    Goals Met:  Independence with exercise equipment Exercise tolerated well No report of concerns or symptoms today  Goals Unmet:  Not Applicable  Comments: Pt able to follow exercise prescription today without complaint.  Will continue to monitor for progression.    Dr. Emily Filbert is Medical Director for [place].  Dr. Ottie Glazier is Medical Director for [facility] Pulmonary Rehabilitation.

## 2021-12-07 ENCOUNTER — Encounter: Payer: BC Managed Care – PPO | Admitting: Psychology

## 2021-12-07 NOTE — Progress Notes (Signed)
This encounter was created in error - please disregard.

## 2021-12-08 ENCOUNTER — Encounter: Payer: BC Managed Care – PPO | Admitting: *Deleted

## 2021-12-08 DIAGNOSIS — R0600 Dyspnea, unspecified: Secondary | ICD-10-CM | POA: Diagnosis not present

## 2021-12-08 DIAGNOSIS — R0609 Other forms of dyspnea: Secondary | ICD-10-CM | POA: Diagnosis not present

## 2021-12-08 DIAGNOSIS — I5032 Chronic diastolic (congestive) heart failure: Secondary | ICD-10-CM | POA: Diagnosis not present

## 2021-12-08 NOTE — Progress Notes (Signed)
Daily Session Note  Patient Details  Name: Brandy [name] MRN: [id number] Date of Birth: [birth year] Referring Provider:   Flowsheet Row Pulmonary Rehab from 08/17/2021 in [facility] Cardiac and Pulmonary Rehab  Referring Provider Ida Rogue MD       Encounter Date: 12/08/2021  Check In:  Session Check In - 12/08/21 1152       Check-In   Supervising physician immediately available to respond to emergencies See telemetry face sheet for immediately available ER MD    Location [facility]-Cardiac & Pulmonary Rehab    Staff Present Nyoka Cowden, RN, BSN, [place], BS, RRT, CPFT;Jessica Luan Pulling, MA, RCEP, CCRP, CCET    Virtual Visit No    Medication changes reported     No    Fall or balance concerns reported    No    Tobacco Cessation No Change    Warm-up and Cool-down Performed on first and last piece of equipment    Resistance Training Performed Yes    VAD Patient? No    PAD/SET Patient? No      Pain Assessment   Currently in Pain? No/denies                Social History   Tobacco Use  Smoking Status Never  Smokeless Tobacco Never    Goals Met:  Independence with exercise equipment Exercise tolerated well No report of concerns or symptoms today  Goals Unmet:  Not Applicable  Comments: .exgoo   Dr. Emily Filbert is Medical Director for [place].  Dr. Ottie Glazier is Medical Director for [facility] Pulmonary Rehabilitation.

## 2021-12-12 ENCOUNTER — Ambulatory Visit: Payer: BC Managed Care – PPO

## 2021-12-13 ENCOUNTER — Encounter: Payer: BC Managed Care – PPO | Admitting: *Deleted

## 2021-12-13 DIAGNOSIS — R0609 Other forms of dyspnea: Secondary | ICD-10-CM | POA: Diagnosis not present

## 2021-12-13 DIAGNOSIS — R0600 Dyspnea, unspecified: Secondary | ICD-10-CM | POA: Diagnosis not present

## 2021-12-13 DIAGNOSIS — I5032 Chronic diastolic (congestive) heart failure: Secondary | ICD-10-CM | POA: Diagnosis not present

## 2021-12-13 NOTE — Progress Notes (Signed)
Daily Session Note  Patient Details  Name: Brandy [name] MRN: [id number] Date of Birth: 22-Sep-1965 Referring Provider:   Flowsheet Row Pulmonary Rehab from 08/17/2021 in [facility] Cardiac and Pulmonary Rehab  Referring Provider Ida Rogue MD       Encounter Date: 12/13/2021  Check In:  Session Check In - 12/13/21 1418       Check-In   Supervising physician immediately available to respond to emergencies See telemetry face sheet for immediately available ER MD    Location [facility]-Cardiac & Pulmonary Rehab    Staff Present Hope Budds, RDN, Tawanna Solo, MS, ASCM CEP, Exercise Physiologist;Reilly Blades Sherryll Burger, RN BSN    Virtual Visit No    Medication changes reported     No    Fall or balance concerns reported    No    Warm-up and Cool-down Performed on first and last piece of equipment    Resistance Training Performed Yes    VAD Patient? No    PAD/SET Patient? No      Pain Assessment   Currently in Pain? No/denies                Social History   Tobacco Use  Smoking Status Never  Smokeless Tobacco Never    Goals Met:  Independence with exercise equipment Exercise tolerated well No report of concerns or symptoms today Strength training completed today  Goals Unmet:  Not Applicable  Comments: Pt able to follow exercise prescription today without complaint.  Will continue to monitor for progression.    Dr. Emily Filbert is Medical Director for [place].  Dr. Ottie Glazier is Medical Director for [facility] Pulmonary Rehabilitation.

## 2021-12-14 ENCOUNTER — Ambulatory Visit (INDEPENDENT_AMBULATORY_CARE_PROVIDER_SITE_OTHER): Payer: BC Managed Care – PPO | Admitting: Psychology

## 2021-12-14 DIAGNOSIS — F339 Major depressive disorder, recurrent, unspecified: Secondary | ICD-10-CM

## 2021-12-14 NOTE — Progress Notes (Signed)
[place] Counselor/Therapist Progress Note  Patient ID: CAY KATH, MRN: [id number]    Date: 12/14/21  Time Spent: 12:32 pm - 1:28 pm:  56 Minutes  Treatment Type: Individual Therapy.  Reported Symptoms: Anxiety   Mental Status Exam: Appearance:  Fairly Groomed     Behavior: Appropriate  Motor: Normal  Speech/Language:  Clear and Coherent and Normal Rate  Affect: Appropriate  Mood: normal  Thought process: normal  Thought content:   WNL  Sensory/Perceptual disturbances:   WNL  Orientation: oriented to person, place, time/date, and situation  Attention: Good  Concentration: Good  Memory: WNL  Fund of knowledge:  Good  Insight:   Good  Judgment:  Good  Impulse Control: Good   Risk Assessment: Danger to Self:  No Self-injurious Behavior: No Danger to Others: No Duty to Warn:no Physical Aggression / Violence:No  Access to Firearms a concern: No  Gang Involvement:No   Subjective:   Yarelin Reichardt Whitener participated car, via video, and consented to treatment. Therapist participated from home office. We met online due to [place] pandemic. Calyse Murcia Cammarano reviewed the events of the past week. She noted her attempts to maintain her own boundaries with her daughter. Therapist praised Benna for her efforts in this area. She noted recent frustration regarding her husband's behavior and recent irritability. She noted her husband's lack of mindfulness and effort to manage frustrations and mood. We explored this during the session and ways to communicate needs calmly and assertively. Therapist challenged Elizebeth's thoughts that her husband is unable to change. We discussed conflict resolution and tools, such as rescripting, to make necessary adjustments going forward. Nikkia was engaged and receptive to the session. She expressed commitment towards our goals. Therapist praised Jisel and provided supportive therapy.   Interventions: CBT & Interpersonal  Interventions: Cognitive  Behavioral Therapy and Interpersonal   Diagnosis: Episode of recurrent major depressive disorder, unspecified depression episode severity ([place])  Treatment Plan:  Client Abilities/Strengths Vonda is intelligent, forthcoming, self-aware, and motivated for change.   Support System: Friend [name] and Family.   Client Treatment Preferences Outpatient Therapy.  Client Statement of Needs Miriya's goals for treatment managing her mood and symptoms, processing past events, focus on self-care, set boundaries for self and others, decrease negative self-talk, decrease rumination, process relationship stressors, and improve health.   Treatment Level Weekly  Symptoms  Depressive symptoms: Decreased interest, feeling down, poor sleep, lethargy, psycho-motor retardation, feeling bad about self.  (Status: maintained)  Anxiety: Difficulty managing worry, worrying about different things, restlessness, irritability, afraid something awful might happen  (Status: maintained)  Goals:  Julius experiences symptoms of depression and anxiety.   Target Date: 2022-06-08 Frequency: Weekly  Progress: 10 Modality: individual    Therapist will provide referrals for additional resources as appropriate.  Therapist will provide psycho-education regarding Elanor's diagnosis and corresponding treatment approaches and interventions. Licensed Clinical Social Worker, [place], LCSW will support the patient's ability to achieve the goals identified. will employ CBT, BA, Problem-solving, Solution Focused, Mindfulness,  coping skills, & other evidenced-based practices will be used to promote progress towards healthy functioning to help manage decrease symptoms associated with her diagnosis.   Reduce overall level, frequency, and intensity of the feelings of depression and anxiety as evidenced by decreased overall symptoms of depression and anxiety from 6 to 7 days/week to 0 to 1 days/week per client report for at least 3  consecutive months. Verbally express understanding of the relationship between feelings of depression, anxiety and their impact  on thinking patterns and behaviors. Verbalize an understanding of the role that distorted thinking plays in creating fears, excessive worry, and ruminations.  [name] participated in the creation of the treatment plan)    [name], LCSW

## 2021-12-21 ENCOUNTER — Ambulatory Visit: Payer: BC Managed Care – PPO | Admitting: Psychology

## 2021-12-21 ENCOUNTER — Ambulatory Visit (INDEPENDENT_AMBULATORY_CARE_PROVIDER_SITE_OTHER): Payer: BC Managed Care – PPO | Admitting: Psychology

## 2021-12-21 DIAGNOSIS — F339 Major depressive disorder, recurrent, unspecified: Secondary | ICD-10-CM

## 2021-12-21 DIAGNOSIS — F411 Generalized anxiety disorder: Secondary | ICD-10-CM | POA: Diagnosis not present

## 2021-12-21 DIAGNOSIS — F5105 Insomnia due to other mental disorder: Secondary | ICD-10-CM | POA: Diagnosis not present

## 2021-12-21 DIAGNOSIS — F39 Unspecified mood [affective] disorder: Secondary | ICD-10-CM | POA: Diagnosis not present

## 2021-12-21 NOTE — Progress Notes (Signed)
[place] Counselor/Therapist Progress Note  Patient ID: Brandy [name], MRN: [id number]    Date: 12/25/21  Time Spent: 12:03 pm - 1:00 pm:  57 Minutes  Treatment Type: Individual Therapy.  Reported Symptoms: Anxiety   Mental Status Exam: Appearance:  Fairly Groomed     Behavior: Appropriate  Motor: Normal  Speech/Language:  Clear and Coherent and Normal Rate  Affect: Appropriate  Mood: normal  Thought process: normal  Thought content:   WNL  Sensory/Perceptual disturbances:   WNL  Orientation: oriented to person, place, time/date, and situation  Attention: Good  Concentration: Good  Memory: WNL  Fund of knowledge:  Good  Insight:   Good  Judgment:  Good  Impulse Control: Good   Risk Assessment: Danger to Self:  No Self-injurious Behavior: No Danger to Others: No Duty to Warn:no Physical Aggression / Violence:No  Access to Firearms a concern: No  Gang Involvement:No   Subjective:   Brandy [name] participated car, via video, and consented to treatment. Therapist participated from home office. We met online due to [name]. Brandy [name] reviewed the events of the past week. She noted worry about being a narcissist after reading an article concerning narcissist. We explored this, during the session, and processed this. She noted her need to identify her need for attention from others, primarily female figures, even during preteen years. She noted not receiving much attention from her parents, as a child, and when receiving attention not in activities that she was interest in. She noted poor supervision by her parents. We will continue to explore this going forward. She noted overeating, at a young age, to get good feelings. She noted the possibility that the longer she stayed at the table for attention as well. Therapist validated and normalized Brandy [name]'s feelings and experience and provided supportive therapy.   Interventions: CBT &  Interpersonal  Interventions: Cognitive Behavioral Therapy and Interpersonal   Diagnosis: Episode of recurrent major depressive disorder, unspecified depression episode severity (Brandy [name])  Treatment Plan:  [name], forthcoming, self-aware, and motivated for change.   Support System: Friend Brandy [name]) and Family.   Client Treatment Preferences Outpatient Therapy.  Client Statement of Needs Brandy [name]'s goals for treatment managing her mood and symptoms, processing past events, focus on self-care, set boundaries for self and others, decrease negative self-talk, decrease rumination, process relationship stressors, and improve health.   Treatment Level Weekly  Symptoms  Depressive symptoms: Decreased interest, feeling down, poor sleep, lethargy, psycho-motor retardation, feeling bad about self.  (Status: maintained)  Anxiety: Difficulty managing worry, worrying about different things, restlessness, irritability, afraid something awful might happen  (Status: maintained)  Goals:  Brandy [name] experiences symptoms of depression and anxiety.   Target Date: 2022-06-08 Frequency: Weekly  Progress: 10 Modality: individual    Therapist will provide referrals for additional resources as appropriate.  Therapist will provide psycho-education regarding Brandy [name]'s diagnosis and corresponding treatment approaches and interventions. Licensed Clinical Social Worker, [name], LCSW will support the patient's ability to achieve the goals identified. will employ CBT, BA, Problem-solving, Solution Focused, Mindfulness,  coping skills, & other evidenced-based practices will be used to promote progress towards healthy functioning to help manage decrease symptoms associated with her diagnosis.   Reduce overall level, frequency, and intensity of the feelings of depression and anxiety as evidenced by decreased overall symptoms of depression and anxiety from 6 to 7 days/week to 0 to 1  days/week per client report for at least 3 consecutive months. Verbally express  understanding of the relationship between feelings of depression, anxiety and their impact on thinking patterns and behaviors. Verbalize an understanding of the role that distorted thinking plays in creating fears, excessive worry, and ruminations.  Brandy [name] participated in the creation of the treatment plan)    [name], LCSW

## 2021-12-25 ENCOUNTER — Encounter: Payer: Self-pay | Admitting: *Deleted

## 2021-12-25 DIAGNOSIS — R0600 Dyspnea, unspecified: Secondary | ICD-10-CM

## 2021-12-28 ENCOUNTER — Ambulatory Visit (INDEPENDENT_AMBULATORY_CARE_PROVIDER_SITE_OTHER): Payer: BC Managed Care – PPO | Admitting: Psychology

## 2021-12-28 DIAGNOSIS — F339 Major depressive disorder, recurrent, unspecified: Secondary | ICD-10-CM | POA: Diagnosis not present

## 2021-12-28 NOTE — Progress Notes (Signed)
[facility] Counselor/Therapist Progress Note  Patient ID: KAYDE ATKERSON, MRN: [id number]    Date: 12/28/21  Time Spent: 12:04 pm - 1:03 pm: 75 Minutes  Treatment Type: Individual Therapy.  Reported Symptoms: Anxiety   Mental Status Exam: Appearance:  Casual     Behavior: Appropriate  Motor: Normal  Speech/Language:  Clear and Coherent and Normal Rate  Affect: Appropriate  Mood: anxious  Thought process: normal  Thought content:   WNL  Sensory/Perceptual disturbances:   WNL  Orientation: oriented to person, place, time/date, and situation  Attention: Good  Concentration: Good  Memory: WNL  Fund of knowledge:  Good  Insight:   Good  Judgment:  Good  Impulse Control: Good   Risk Assessment: Danger to Self:  No Self-injurious Behavior: No Danger to Others: No Duty to Warn:no Physical Aggression / Violence:No  Access to Firearms a concern: No  Gang Involvement:No   Subjective:   Shanena Pellegrino Giacomo participated car, via video, and consented to treatment. Therapist participated from home office. We met online due to [place] pandemic. Aisa Schoeppner Labrie reviewed the events of the past week. Esraa noted her daughter often taking out her frustrations out at [place]. She noted [name] often withholding information from [place] as a method of control. She noted frustration regarding her daughter's behavior who appears to be reactive and quick to anger. She noted a history of strain and frustration within their relationship. She noted herself, as a child, as hypersexual and consider's self as such, as an adult. We discussed this in relation to her psychiatric provider's rule-out of bipolar. Therapist encouraged Teresita to review additional resources for bipolar. We will explore this during the follow-up session along with additional possible symptoms she could be experiencing.  Vicie was engaged and motivated during the session. Therapist validated and normalized Nandi's feelings and  experience and provided supportive therapy.   Interventions: CBT & Interpersonal  Interventions: Cognitive Behavioral Therapy and Interpersonal   Diagnosis: Episode of recurrent major depressive disorder, unspecified depression episode severity ([place])  Treatment Plan:  Client Abilities/Strengths Almeda is intelligent, forthcoming, self-aware, and motivated for change.   Support System: Friend [name] and Family.   Client Treatment Preferences Outpatient Therapy.  Client Statement of Needs Cashlynn's goals for treatment managing her mood and symptoms, processing past events, focus on self-care, set boundaries for self and others, decrease negative self-talk, decrease rumination, process relationship stressors, and improve health.   Treatment Level Weekly  Symptoms  Depressive symptoms: Decreased interest, feeling down, poor sleep, lethargy, psycho-motor retardation, feeling bad about self.  (Status: maintained)  Anxiety: Difficulty managing worry, worrying about different things, restlessness, irritability, afraid something awful might happen  (Status: maintained)  Goals:  Regan experiences symptoms of depression and anxiety.   Target Date: 2022-06-08 Frequency: Weekly  Progress: 10 Modality: individual    Therapist will provide referrals for additional resources as appropriate.  Therapist will provide psycho-education regarding Mychal's diagnosis and corresponding treatment approaches and interventions. Licensed Clinical Social Worker, [place], LCSW will support the patient's ability to achieve the goals identified. will employ CBT, BA, Problem-solving, Solution Focused, Mindfulness,  coping skills, & other evidenced-based practices will be used to promote progress towards healthy functioning to help manage decrease symptoms associated with her diagnosis.   Reduce overall level, frequency, and intensity of the feelings of depression and anxiety as evidenced by decreased overall  symptoms of depression and anxiety from 6 to 7 days/week to 0 to 1 days/week per client report for at  least 3 consecutive months. Verbally express understanding of the relationship between feelings of depression, anxiety and their impact on thinking patterns and behaviors. Verbalize an understanding of the role that distorted thinking plays in creating fears, excessive worry, and ruminations.  [name] participated in the creation of the treatment plan)    [name], LCSW

## 2021-12-29 DIAGNOSIS — R0600 Dyspnea, unspecified: Secondary | ICD-10-CM

## 2021-12-29 NOTE — Progress Notes (Signed)
Patient has called out for today. She has not attended since 12/13/2021. Patients attendance has been sporadic due to not feeling well. Will follow up with patient. Brandy [name] has been out since the last Dr. Hyacinth Meeker day. Goals will be reviewed when patient returns and has been attending regularly.

## 2022-01-03 ENCOUNTER — Encounter: Payer: Self-pay | Admitting: *Deleted

## 2022-01-03 ENCOUNTER — Encounter: Payer: Medicare Other | Attending: General Surgery

## 2022-01-03 DIAGNOSIS — R0600 Dyspnea, unspecified: Secondary | ICD-10-CM

## 2022-01-03 NOTE — Progress Notes (Signed)
Pulmonary Individual Treatment Plan  Patient Details  Name: Brandy [name] MRN: [id number] Date of Birth: 1965-09-21 Referring Provider:   Flowsheet Row Pulmonary Rehab from 08/17/2021 in [facility] Cardiac and Pulmonary Rehab  Referring Provider Ida Rogue MD       Initial Encounter Date:  Flowsheet Row Pulmonary Rehab from 08/17/2021 in [facility] Cardiac and Pulmonary Rehab  Date 08/17/21       Visit Diagnosis: Dyspnea, unspecified type  Patient's Home Medications on Admission:  Current Outpatient Medications:    acetaminophen (TYLENOL) 650 MG CR tablet, Take 1,300 mg by mouth every 8 (eight) hours as needed for pain., Disp: , Rfl:    atorvastatin (LIPITOR) 10 MG tablet, TAKE 1 TABLET BY MOUTH EVERY DAY FOR CHOLESTEROL, Disp: 90 tablet, Rfl: 2   cetirizine (ZYRTEC) 10 MG tablet, TAKE 1 TABLET (10 MG TOTAL) BY MOUTH DAILY. FOR ALLERGIES, Disp: 90 tablet, Rfl: 1   citalopram (CELEXA) 40 MG tablet, Take 40 mg by mouth daily., Disp: , Rfl:    DULoxetine (CYMBALTA) 30 MG capsule, Take 30 mg by mouth daily. Take with 60 mg to equal 90 mg daily, Disp: , Rfl:    DULoxetine (CYMBALTA) 60 MG capsule, Take 60 mg by mouth daily. Take with 30 mg to equal 90 mg daily, Disp: , Rfl:    folic acid (FOLVITE) 1 MG tablet, Take 1 mg by mouth daily., Disp: , Rfl:    gabapentin (NEURONTIN) 300 MG capsule, TAKE 1 CAPSULE BY MOUTH 3 TIMES A DAY AS NEEDED FOR BACK PAIN, Disp: 270 capsule, Rfl: 3   lamoTRIgine (LAMICTAL) 100 MG tablet, Take 100 mg by mouth every evening., Disp: , Rfl:    lamoTRIgine (LAMICTAL) 200 MG tablet, Take 200 mg by mouth in the morning., Disp: , Rfl:    lisinopril (ZESTRIL) 30 MG tablet, TAKE 1 TABLET (30 MG TOTAL) BY MOUTH DAILY. FOR BLOOD PRESSURE, Disp: 90 tablet, Rfl: 2   metroNIDAZOLE (METROCREAM) 0.75 % cream, Apply 1 application topically daily as needed (rosacea)., Disp: , Rfl:    Multiple Minerals-Vitamins (CALCIUM-MAGNESIUM-ZINC-D3) TABS, Take 2 tablets by mouth daily., Disp: ,  Rfl:    ondansetron (ZOFRAN-ODT) 4 MG disintegrating tablet, Dissolve 1 tablet (4 mg total) by mouth every 6 (six) hours as needed for nausea or vomiting., Disp: 20 tablet, Rfl: 0   pantoprazole (PROTONIX) 40 MG tablet, Take 1 tablet (40 mg total) by mouth daily., Disp: 90 tablet, Rfl: 0   sucralfate (CARAFATE) 1 g tablet, Take by mouth., Disp: , Rfl:    traZODone (DESYREL) 50 MG tablet, Take 50 mg by mouth at bedtime as needed for sleep., Disp: , Rfl:    Vitamin D, Ergocalciferol, (DRISDOL) 1.25 MG (50000 UNIT) CAPS capsule, TAKE 1 CAPSULE BY MOUTH ONCE WEEKLY FOR VITAMIN D., Disp: 12 capsule, Rfl: 1  Past Medical History: Past Medical History:  Diagnosis Date   Anxiety    Arthritis    Back pain    Brain fog    CHF (congestive heart failure) [place]    pt not aware of this   Depression    Diabetes mellitus without complication (HCC)    Difficulty walking    Dysmetabolic syndrome X    Fatigue    Fibromyalgia    neuropathy   Hypertension    Hypertriglyceridemia    Joint pain    Leg weakness    Lower extremity edema    Muscle atrophy    Neuropathy    Obesity    Osteoarthritis  PONV (postoperative nausea and vomiting)    Poor memory    Post-menopausal bleeding    Prediabetes    Rosacea    Sleep apnea    not on cpap yet   Tetanus vaccine causing adverse effect in therapeutic use    Vitamin D deficiency     Tobacco Use: Social History   Tobacco Use  Smoking Status Never  Smokeless Tobacco Never    Labs: Review Flowsheet  More data exists      Latest Ref Rng & Units 06/01/2020 10/04/2020 03/20/2021 06/20/2021 11/09/2021  Labs for ITP Cardiac and Pulmonary Rehab  Cholestrol 0 - 200 mg/dL 137  - - 166  -  LDL (calc) 0 - 99 mg/dL 49  - - 92  -  HDL-C >39.00 mg/dL 57.30  - - 40.10  -  Trlycerides 0.0 - 149.0 mg/dL 152.0  - - 169.0  -  Hemoglobin A1c 4.0 - 5.6 % 7.0  8.6  7.9  7.0  6.1      Pulmonary Assessment Scores:  Pulmonary Assessment Scores     Row Name  08/17/21 1703         ADL UCSD   ADL Phase Entry     SOB Score total 85     Rest 0     Walk 3     Stairs 5     Bath 3     Dress 2     Shop 5       CAT Score   CAT Score 13       mMRC Score   mMRC Score 2              UCSD: Self-administered rating of dyspnea associated with activities of daily living (ADLs) 6-point scale (0 = "not at all" to 5 = "maximal or unable to do because of breathlessness")  Scoring Scores range from 0 to 120.  Minimally important difference is 5 units  CAT: CAT can identify the health impairment of COPD patients and is better correlated with disease progression.  CAT has a scoring range of zero to 40. The CAT score is classified into four groups of low (less than 10), medium (10 - 20), high (21-30) and very high (31-40) based on the impact level of disease on health status. A CAT score over 10 suggests significant symptoms.  A worsening CAT score could be explained by an exacerbation, poor medication adherence, poor inhaler technique, or progression of COPD or comorbid conditions.  CAT MCID is 2 points  mMRC: mMRC (Modified Medical Research Council) Dyspnea Scale is used to assess the degree of baseline functional disability in patients of respiratory disease due to dyspnea. No minimal important difference is established. A decrease in score of 1 point or greater is considered a positive change.   Pulmonary Function Assessment:  Pulmonary Function Assessment - 08/14/21 1123       Breath   Shortness of Breath Limiting activity;Yes             Exercise Target Goals: Exercise Program Goal: Individual exercise prescription set using results from initial 6 min walk test and THRR while considering  patient's activity barriers and safety.   Exercise Prescription Goal: Initial exercise prescription builds to 30-45 minutes a day of aerobic activity, 2-3 days per week.  Home exercise guidelines will be given to patient during program as part of  exercise prescription that the participant will acknowledge.  Education: Aerobic Exercise: - Group verbal and visual presentation  on the components of exercise prescription. Introduces F.I.T.T principle from ACSM for exercise prescriptions.  Reviews F.I.T.T. principles of aerobic exercise including progression. Written material given at graduation. Flowsheet Row Pulmonary Rehab from 12/06/2021 in [facility] Cardiac and Pulmonary Rehab  Education need identified 08/17/21       Education: Resistance Exercise: - Group verbal and visual presentation on the components of exercise prescription. Introduces F.I.T.T principle from ACSM for exercise prescriptions  Reviews F.I.T.T. principles of resistance exercise including progression. Written material given at graduation.    Education: Exercise & Equipment Safety: - Individual verbal instruction and demonstration of equipment use and safety with use of the equipment. Flowsheet Row Pulmonary Rehab from 08/14/2021 in [facility] Cardiac and Pulmonary Rehab  Date 08/14/21  Educator [facility]  Instruction Review Code 1- Verbalizes Understanding       Education: Exercise Physiology & General Exercise Guidelines: - Group verbal and written instruction with models to review the exercise physiology of the cardiovascular system and associated critical values. Provides general exercise guidelines with specific guidelines to those with heart or lung disease.  Flowsheet Row Pulmonary Rehab from 12/06/2021 in [facility] Cardiac and Pulmonary Rehab  Education need identified 08/17/21       Education: Flexibility, Balance, Mind/Body Relaxation: - Group verbal and visual presentation with interactive activity on the components of exercise prescription. Introduces F.I.T.T principle from ACSM for exercise prescriptions. Reviews F.I.T.T. principles of flexibility and balance exercise training including progression. Also discusses the mind body connection.  Reviews various relaxation  techniques to help reduce and manage stress (i.e. Deep breathing, progressive muscle relaxation, and visualization). Balance handout provided to take home. Written material given at graduation.   Activity Barriers & Risk Stratification:  Activity Barriers & Cardiac Risk Stratification - 08/17/21 1717       Activity Barriers & Cardiac Risk Stratification   Activity Barriers Deconditioning;Muscular Weakness;Shortness of Breath;Fibromyalgia;Joint Problems             6 Minute Walk:  6 Minute Walk     Row Name 08/17/21 1715         6 Minute Walk   Phase Initial     Distance 765 feet     Walk Time 6 minutes     # of Rest Breaks 0     MPH 1.44     METS 2.17     RPE 13     Perceived Dyspnea  2     VO2 Peak 7.6     Symptoms Yes (comment)     Comments SOB     Resting HR 65 bpm     Resting BP 140/70     Resting Oxygen Saturation  96 %     Exercise Oxygen Saturation  during 6 min walk 96 %     Max Ex. HR 140 bpm     Max Ex. BP 154/78     2 Minute Post BP 138/78       Interval HR   1 Minute HR 98     2 Minute HR 115     3 Minute HR 129     4 Minute HR 137     5 Minute HR 139     6 Minute HR 140     2 Minute Post HR 75     Interval Heart Rate? Yes       Interval Oxygen   Interval Oxygen? Yes     Baseline Oxygen Saturation % 96 %     1 Minute  Oxygen Saturation % 97 %     1 Minute Liters of Oxygen 0 L  RA     2 Minute Oxygen Saturation % 99 %     2 Minute Liters of Oxygen 0 L     3 Minute Oxygen Saturation % 98 %     3 Minute Liters of Oxygen 0 L     4 Minute Oxygen Saturation % 98 %     4 Minute Liters of Oxygen 0 L     5 Minute Oxygen Saturation % 99 %     5 Minute Liters of Oxygen 0 L     6 Minute Oxygen Saturation % 98 %     6 Minute Liters of Oxygen 0 L     2 Minute Post Oxygen Saturation % 97 %     2 Minute Post Liters of Oxygen 0 L             Oxygen Initial Assessment:  Oxygen Initial Assessment - 08/17/21 1702       Home Oxygen   Home Oxygen  Device None    Sleep Oxygen Prescription None    Home Exercise Oxygen Prescription None    Home Resting Oxygen Prescription None    Compliance with Home Oxygen Use Yes      Initial 6 min Walk   Oxygen Used None      Program Oxygen Prescription   Program Oxygen Prescription None      Intervention   Short Term Goals To learn and understand importance of monitoring SPO2 with pulse oximeter and demonstrate accurate use of the pulse oximeter.;To learn and understand importance of maintaining oxygen saturations>88%;To learn and demonstrate proper pursed lip breathing techniques or other breathing techniques.     Long  Term Goals Verbalizes importance of monitoring SPO2 with pulse oximeter and return demonstration;Maintenance of O2 saturations>88%;Exhibits proper breathing techniques, such as pursed lip breathing or other method taught during program session;Compliance with respiratory medication             Oxygen Re-Evaluation:  Oxygen Re-Evaluation     Row Name 08/21/21 1018 10/02/21 1155 10/27/21 1122 11/21/21 1131       Program Oxygen Prescription   Program Oxygen Prescription -- None None None      Home Oxygen   Home Oxygen Device -- None None None    Sleep Oxygen Prescription -- None None None    Home Exercise Oxygen Prescription -- None None None    Home Resting Oxygen Prescription -- None None None    Compliance with Home Oxygen Use -- Yes Yes --      Goals/Expected Outcomes   Short Term Goals -- To learn and understand importance of monitoring SPO2 with pulse oximeter and demonstrate accurate use of the pulse oximeter.;To learn and understand importance of maintaining oxygen saturations>88%;To learn and demonstrate proper pursed lip breathing techniques or other breathing techniques.  To learn and understand importance of monitoring SPO2 with pulse oximeter and demonstrate accurate use of the pulse oximeter.;To learn and understand importance of maintaining oxygen  saturations>88%;To learn and demonstrate proper pursed lip breathing techniques or other breathing techniques.  To learn and understand importance of monitoring SPO2 with pulse oximeter and demonstrate accurate use of the pulse oximeter.;To learn and understand importance of maintaining oxygen saturations>88%;To learn and demonstrate proper pursed lip breathing techniques or other breathing techniques.     Long  Term Goals -- Verbalizes importance of monitoring SPO2 with pulse oximeter and return  demonstration;Maintenance of O2 saturations>88%;Exhibits proper breathing techniques, such as pursed lip breathing or other method taught during program session;Compliance with respiratory medication Verbalizes importance of monitoring SPO2 with pulse oximeter and return demonstration;Maintenance of O2 saturations>88%;Exhibits proper breathing techniques, such as pursed lip breathing or other method taught during program session;Compliance with respiratory medication Verbalizes importance of monitoring SPO2 with pulse oximeter and return demonstration;Maintenance of O2 saturations>88%;Exhibits proper breathing techniques, such as pursed lip breathing or other method taught during program session;Compliance with respiratory medication    Comments Reviewed PLB technique with pt.  Talked about how it works and it's importance in maintaining their exercise saturations. Discussed purchasing a pulse ox to monitor her heart rate and oxygen. She is checking her HR via her BP cuff, but not during exercise. She's walking to rehab without any assistance, but wants to work on balance exercises. Brandy [name] is doing well in rehab. She is still getting winded walking and doing dishes.  She is good about using her PLB to help with exercise and especially with weights. She still has not gotten a pulse oximeter but just got paid so she will get one soon. Brandy [name] is doing well in rehab. She does not have a pulse oximeter yet, but plans to purchase  soon.  She is doing well with her PLB and she uses it regularly.  She is feeling more confident with it as well.    Goals/Expected Outcomes Short: Become more profiecient at using PLB.   Long: Become independent at using PLB. ST: purchase a pulse ox. LT: continue to monitor HR and oxygen at home independently Short: Continue work on PLB with exercise Long: COnintue to manage breathing. Short: Continue to use PLB daily. Long: COntinue to improve breathing             Oxygen Discharge (Final Oxygen Re-Evaluation):  Oxygen Re-Evaluation - 11/21/21 1131       Program Oxygen Prescription   Program Oxygen Prescription None      Home Oxygen   Home Oxygen Device None    Sleep Oxygen Prescription None    Home Exercise Oxygen Prescription None    Home Resting Oxygen Prescription None      Goals/Expected Outcomes   Short Term Goals To learn and understand importance of monitoring SPO2 with pulse oximeter and demonstrate accurate use of the pulse oximeter.;To learn and understand importance of maintaining oxygen saturations>88%;To learn and demonstrate proper pursed lip breathing techniques or other breathing techniques.     Long  Term Goals Verbalizes importance of monitoring SPO2 with pulse oximeter and return demonstration;Maintenance of O2 saturations>88%;Exhibits proper breathing techniques, such as pursed lip breathing or other method taught during program session;Compliance with respiratory medication    Comments Dawson is doing well in rehab. She does not have a pulse oximeter yet, but plans to purchase soon.  She is doing well with her PLB and she uses it regularly.  She is feeling more confident with it as well.    Goals/Expected Outcomes Short: Continue to use PLB daily. Long: COntinue to improve breathing             Initial Exercise Prescription:  Initial Exercise Prescription - 08/17/21 1700       Date of Initial Exercise RX and Referring Provider   Date 08/17/21    Referring  Provider Ida Rogue MD      Oxygen   Maintain Oxygen Saturation 88% or higher      NuStep   Level 1  SPM 80    Minutes 15    METs 1.4      REL-XR   Level 1    Speed 50    Minutes 15    METs 1.4      T5 Nustep   Level 1    SPM 80    Minutes 15    METs 1.4      Track   Laps 7    Minutes 15    METs 1.38      Prescription Details   Frequency (times per week) 3    Duration Progress to 30 minutes of continuous aerobic without signs/symptoms of physical distress      Intensity   THRR 40-80% of Max Heartrate 103 - 142    Ratings of Perceived Exertion 11-13    Perceived Dyspnea 0-4      Progression   Progression Continue to progress workloads to maintain intensity without signs/symptoms of physical distress.      Resistance Training   Training Prescription Yes    Weight 4 lb    Reps 10-15             Perform Capillary Blood Glucose checks as needed.  Exercise Prescription Changes:   Exercise Prescription Changes     Row Name 08/17/21 1700 09/05/21 1100 09/21/21 1400 10/03/21 0800 10/17/21 1600     Response to Exercise   Blood Pressure (Admit) 140/70 144/98 130/70 134/64 122/76   Blood Pressure (Exercise) 154/78 130/70 -- 142/76 --   Blood Pressure (Exit) 138/78 126/70 124/78 124/64 118/64   Heart Rate (Admit) 65 bpm 76 bpm 99 bpm 96 bpm 92 bpm   Heart Rate (Exercise) 140 bpm 102 bpm 133 bpm 110 bpm 110 bpm   Heart Rate (Exit) 68 bpm 99 bpm 94 bpm 93 bpm 96 bpm   Oxygen Saturation (Admit) 96 % 96 % 96 % 97 % 96 %   Oxygen Saturation (Exercise) 98 % 95 % 97 % 93 % 93 %   Oxygen Saturation (Exit) 97 % 96 % 96 % 96 % 95 %   Rating of Perceived Exertion (Exercise) _0 Perceived Dyspnea (Exercise) _1 -- 1   Symptoms SOB SOB none none none   Comments walk test results -- -- -- --   Duration -- Continue with 30 min of aerobic exercise without signs/symptoms of physical distress. Continue with 30 min of aerobic exercise without  signs/symptoms of physical distress. Continue with 30 min of aerobic exercise without signs/symptoms of physical distress. Continue with 30 min of aerobic exercise without signs/symptoms of physical distress.   Intensity -- THRR unchanged THRR unchanged THRR unchanged THRR unchanged     Progression   Progression -- Continue to progress workloads to maintain intensity without signs/symptoms of physical distress. Continue to progress workloads to maintain intensity without signs/symptoms of physical distress. Continue to progress workloads to maintain intensity without signs/symptoms of physical distress. Continue to progress workloads to maintain intensity without signs/symptoms of physical distress.   Average METs -- 1.45 1.58 1.54 1.92     Resistance Training   Training Prescription -- Yes Yes Yes Yes   Weight -- 4 lb 4 lb 4 lb 4 lb   Reps -- 10-15 10-15 10-15 10-15     Interval Training   Interval Training -- No No No No     Treadmill   MPH -- -- 1.1 1.2 --   Grade -- -- 0 0 --  Minutes -- -- 15 15 --   METs -- -- 1.8 1.92 --     Arm Ergometer   Level -- 1 -- -- --   Minutes -- 15 -- -- --   METs -- 1.7 -- -- --     REL-XR   Level -- $Remove'1 3 3 2   'nPxzJrt$ Minutes -- $RemoveBe'15 15 15 15   'aGupsnUGg$ METs -- 1.2 1.2 1.1 --     T5 Nustep   Level -- -- 1 -- 2   Minutes -- -- 15 -- 30   METs -- -- 1.5 -- 2     Track   Laps -- -- 15 11 --   Minutes -- -- 15 15 --   METs -- -- 1.76 1.6 --     Home Exercise Plan   Plans to continue exercise at -- -- -- -- Home (comment)  walking; baseball field   Frequency -- -- -- -- Add 2 additional days to program exercise sessions.   Initial Home Exercises Provided -- -- -- -- 10/02/21     Oxygen   Maintain Oxygen Saturation -- 88% or higher 88% or higher 88% or higher 88% or higher    Row Name 10/30/21 1500 11/13/21 1500 11/21/21 1100 11/29/21 1100 12/12/21 0800     Response to Exercise   Blood Pressure (Admit) 124/72 122/78 -- 108/72 110/60   Blood Pressure  (Exit) 136/70 116/72 -- 102/66 130/78   Heart Rate (Admit) 99 bpm 94 bpm -- 93 bpm 91 bpm   Heart Rate (Exercise) 110 bpm 108 bpm -- 127 bpm 131 bpm   Heart Rate (Exit) 103 bpm 98 bpm -- 107 bpm 108 bpm   Oxygen Saturation (Admit) 95 % 98 % -- 97 % 96 %   Oxygen Saturation (Exercise) 94 % 98 % -- 95 % 93 %   Oxygen Saturation (Exit) 97 % 98 % -- 98 % 95 %   Rating of Perceived Exertion (Exercise) 13 12 -- 13 13   Perceived Dyspnea (Exercise) 1 1 -- 2 1   Symptoms none none -- SOB none   Duration Continue with 30 min of aerobic exercise without signs/symptoms of physical distress. Continue with 30 min of aerobic exercise without signs/symptoms of physical distress. -- Continue with 30 min of aerobic exercise without signs/symptoms of physical distress. Continue with 30 min of aerobic exercise without signs/symptoms of physical distress.   Intensity THRR unchanged THRR unchanged -- THRR unchanged THRR unchanged     Progression   Progression Continue to progress workloads to maintain intensity without signs/symptoms of physical distress. Continue to progress workloads to maintain intensity without signs/symptoms of physical distress. -- Continue to progress workloads to maintain intensity without signs/symptoms of physical distress. Continue to progress workloads to maintain intensity without signs/symptoms of physical distress.   Average METs 2 3.4 -- 2.48 2.75     Resistance Training   Training Prescription Yes Yes -- Yes Yes   Weight 4 lb 4 lb -- 4 lb 4 lb   Reps 10-15 10-15 -- 10-15 10-15     Interval Training   Interval Training No No -- No No     Treadmill   MPH -- -- -- 1.2 1.7   Grade -- -- -- 0 0   Minutes -- -- -- 15 15   METs -- -- -- 1.92 2.3     REL-XR   Level 2 5 -- 4 3   Minutes 15 15 -- 30 30  METs 1.3 3.4 -- 2.3 2     T5 Nustep   Level 3 -- -- -- --   Minutes 30 -- -- -- --   METs 2.6 -- -- -- --     Home Exercise Plan   Plans to continue exercise at Home  (comment)  walking; baseball field Home (comment)  walking; baseball field Home (comment)  walking; PT exercises Home (comment)  walking; PT exercises Home (comment)  walking; PT exercises   Frequency Add 2 additional days to program exercise sessions. Add 2 additional days to program exercise sessions. Add 2 additional days to program exercise sessions. Add 2 additional days to program exercise sessions. Add 2 additional days to program exercise sessions.   Initial Home Exercises Provided 10/02/21 10/02/21 11/21/21 11/21/21 11/21/21     Oxygen   Maintain Oxygen Saturation 88% or higher 88% or higher 88% or higher 88% or higher 88% or higher    Row Name 12/25/21 1500             Response to Exercise   Blood Pressure (Admit) 114/72       Blood Pressure (Exit) 112/68       Heart Rate (Admit) 106 bpm       Heart Rate (Exercise) 133 bpm       Heart Rate (Exit) 107 bpm       Oxygen Saturation (Admit) 98 %       Oxygen Saturation (Exercise) 96 %       Oxygen Saturation (Exit) 98 %       Rating of Perceived Exertion (Exercise) 13       Symptoms none       Duration Continue with 30 min of aerobic exercise without signs/symptoms of physical distress.       Intensity THRR unchanged         Progression   Progression Continue to progress workloads to maintain intensity without signs/symptoms of physical distress.       Average METs 2.9         Resistance Training   Training Prescription Yes       Weight 5 lb       Reps 10-15         Interval Training   Interval Training No         Treadmill   MPH 1.7       Grade 0       Minutes 15       METs 2.3         REL-XR   Level 3       Minutes 15       METs 3.4         Home Exercise Plan   Plans to continue exercise at Home (comment)  walking; PT exercises       Frequency Add 2 additional days to program exercise sessions.       Initial Home Exercises Provided 11/21/21         Oxygen   Maintain Oxygen Saturation 88% or higher                 Exercise Comments:   Exercise Goals and Review:   Exercise Goals     Row Name 08/17/21 1719             Exercise Goals   Increase Physical Activity Yes       Intervention Provide advice, education, support and counseling about physical activity/exercise needs.;Develop an individualized  exercise prescription for aerobic and resistive training based on initial evaluation findings, risk stratification, comorbidities and participant's personal goals.       Expected Outcomes Short Term: Attend rehab on a regular basis to increase amount of physical activity.;Long Term: Add in home exercise to make exercise part of routine and to increase amount of physical activity.;Long Term: Exercising regularly at least 3-5 days a week.       Increase Strength and Stamina Yes       Intervention Provide advice, education, support and counseling about physical activity/exercise needs.;Develop an individualized exercise prescription for aerobic and resistive training based on initial evaluation findings, risk stratification, comorbidities and participant's personal goals.       Expected Outcomes Short Term: Increase workloads from initial exercise prescription for resistance, speed, and METs.;Short Term: Perform resistance training exercises routinely during rehab and add in resistance training at home;Long Term: Improve cardiorespiratory fitness, muscular endurance and strength as measured by increased METs and functional capacity (6MWT)       Able to understand and use rate of perceived exertion (RPE) scale Yes       Intervention Provide education and explanation on how to use RPE scale       Expected Outcomes Short Term: Able to use RPE daily in rehab to express subjective intensity level;Long Term:  Able to use RPE to guide intensity level when exercising independently       Able to understand and use Dyspnea scale Yes       Intervention Provide education and explanation on how to use  Dyspnea scale       Expected Outcomes Short Term: Able to use Dyspnea scale daily in rehab to express subjective sense of shortness of breath during exertion;Long Term: Able to use Dyspnea scale to guide intensity level when exercising independently       Knowledge and understanding of Target Heart Rate Range (THRR) Yes       Intervention Provide education and explanation of THRR including how the numbers were predicted and where they are located for reference       Expected Outcomes Short Term: Able to state/look up THRR;Short Term: Able to use daily as guideline for intensity in rehab;Long Term: Able to use THRR to govern intensity when exercising independently       Able to check pulse independently Yes       Intervention Provide education and demonstration on how to check pulse in carotid and radial arteries.;Review the importance of being able to check your own pulse for safety during independent exercise       Expected Outcomes Short Term: Able to explain why pulse checking is important during independent exercise;Long Term: Able to check pulse independently and accurately       Understanding of Exercise Prescription Yes       Intervention Provide education, explanation, and written materials on patient's individual exercise prescription       Expected Outcomes Short Term: Able to explain program exercise prescription;Long Term: Able to explain home exercise prescription to exercise independently                Exercise Goals Re-Evaluation :  Exercise Goals Re-Evaluation     Row Name 08/21/21 1015 09/05/21 1129 09/11/21 1151 09/21/21 1419 10/02/21 1110     Exercise Goal Re-Evaluation   Exercise Goals Review Increase Physical Activity;Able to understand and use rate of perceived exertion (RPE) scale;Knowledge and understanding of Target Heart Rate Range (THRR);Understanding of Exercise Prescription;Increase Strength  and Stamina;Able to check pulse independently;Able to understand and use  Dyspnea scale Increase Physical Activity;Increase Strength and Stamina;Understanding of Exercise Prescription Increase Physical Activity;Increase Strength and Stamina;Understanding of Exercise Prescription Increase Physical Activity;Increase Strength and Stamina;Understanding of Exercise Prescription Increase Physical Activity;Increase Strength and Stamina;Understanding of Exercise Prescription   Comments Reviewed RPE and dyspnea scales, THR and program prescription with pt today.  Pt voiced understanding and was given a copy of goals to take home. Brandy [name] is off to a good start in rehab.  She has completed her first four days of exercise thus far.  She is now going for the 30 minutes.  We will continue to monitor her progress. Brandy [name] is walking at home around a track for ~1 hour with her husband. She has not gone over home exercise with EP. Brandy [name] is doing well in rehab. She increased her laps on the track from 7 to 15 in total. She also moved up to level 3 on the XR. She tried out the treadmill for the first time at 1.1 mph speed which about the same speed as her track. We hope to see that improve over time. Will continue to monitor. Brandy [name], [place], reviewed home exercise with pt today.  Pt is walking 30-45 min at a baseball field and breaking during laps. Pt is walking 5-6 days per week. Pt plans to continue walking at the baseball field for exercise.  Reviewed THR, pulse, RPE, sign and symptoms, pulse oximetery and when to call 911 or MD.  Also discussed weather considerations and indoor options.  Pt voiced understanding. Pt requested explanation of METs and assistance with balance exercise; balance printout and staff youtube video information provided.   Expected Outcomes Short: Use RPE daily to regulate intensity. Long: Follow program prescription in THR. Short: Continue to attend rehab regularly Long: Continue to follow program prescription ST: EP to go over home exercise LT: Continue to follow program prescription  Short: Increase speed on treadmill Long: Continue to increase overall MET level Short: minimize breaks during walking exercise Long: continue exercising independently at home    Row Name 10/03/21 0832 10/17/21 1656 10/27/21 1106 10/30/21 1519 11/13/21 1532     Exercise Goal Re-Evaluation   Exercise Goals Review Increase Physical Activity;Increase Strength and Stamina;Understanding of Exercise Prescription Increase Physical Activity;Increase Strength and Stamina;Understanding of Exercise Prescription Increase Physical Activity;Increase Strength and Stamina;Understanding of Exercise Prescription Increase Physical Activity;Increase Strength and Stamina;Understanding of Exercise Prescription Increase Physical Activity;Increase Strength and Stamina;Understanding of Exercise Prescription   Comments Brandy [name] is doing well in rehab. She increased her speed on the treadmill to 1.2 mph. She has also tolerated using 4 lb hand weights for resistance training. She could benefit from pushing it to do more laps on the track. We will continue to monitor her progress in the program. Brandy [name] continues to do well in rehab. She did not walk at all since last review. She was able to increase to level 2 on the T5 Nustep. Her level on the XR is inconsistent and would  benefit from bringing that back up.  Oxygen is staying above 88%. Will continue to monitor. Brandy [name] is doing well in rehab.  She has been stretching and PT exercises in addition to what we have been doing in class.  She is walking at the baseball field 90 ft at a time. She is getting more strength back, but not feeling like her stamina is getting better.  She wants to go harder, but limited by walking.  She is  looking into [employer] so that she can use some seated equipment.  We talked about picking the one that she is more likely to go to.  When knows that when she feels progress, she is more motivated to go. We also talked about using the staff videos on bad  weather days. Brandy [name] doing well in rehab. She recently improved to level 3 on the T5 machine. She also improved her overall average METs to 2 METs. She has also tolerated 4 lb hand weights for resistance training. We will continue to monitor her progress in the program. Brandy [name] has only attended rehab once since the last evaluation. She did improve to level 5 on the XR machine. She has also tolerated using 4 lb hand weights for resistance training. We will continue to monitor her progress in the program.   Expected Outcomes Short: Continue to push for more laps on the track. Long: Continue to increase stamina. Short: Increase XR level back up and get back to walking Long: Continue to increase overall MET level Short: Continue to walk on off days Long: Conitnue to improve stamina Short: Continue to follow exercise prescription. Long: Conitnue to improve strength and stamina. Short: Continue to attend rehab regularly. Long: Continue to increase overall MET levels.    [place] Name 11/21/21 1120 11/29/21 1157 12/12/21 0811 12/25/21 1544       Exercise Goal Re-Evaluation   Exercise Goals Review Increase Physical Activity;Increase Strength and Stamina;Understanding of Exercise Prescription;Able to understand and use rate of perceived exertion (RPE) scale;Knowledge and understanding of Target Heart Rate Range (THRR);Able to understand and use Dyspnea scale;Able to check pulse independently Increase Physical Activity;Increase Strength and Stamina;Understanding of Exercise Prescription Increase Physical Activity;Increase Strength and Stamina;Understanding of Exercise Prescription Increase Physical Activity;Increase Strength and Stamina;Understanding of Exercise Prescription    Comments Reviewed home exercise with pt today.  Pt plans to walk and use staff videos at home for exercise.  She also has PT exercises.  Reviewed THR, pulse, RPE, sign and symptoms, pulse oximetery and when to call 911 or MD.  Also discussed weather  considerations and indoor options.  Pt voiced understanding.  Brandy [name] does not have a pulse oximeter at home, but plans to get one. She also wants to work on her balance. Brandy [name] is doing well in rehab. She improved back to level 4 on the XR. She also had an average overall MET level of 2.48 METs. She has tolerated using 4 lb hand weights for resistance training as well. Sophronia continues to do well in rehab. She increased her speed on the treadmill to 1.7 speed which is the highest she has achieved yet. She'd benefit from increasing to 5 lb handweights. She is also due for her post 6MWT soon and we hope to see significant improvement. Will continue to monitor. Brandy [name] last attended on 12/13/21.  She had worked up to her full time on the treadmill without rest breaks.  She is also up to level 3 on the XR.  We will continue to monitor her progress.    Expected Outcomes Short: Start to add in more exercise at home Long: conitnue to improve stamina Short: Try level 5 on the XR. Long: Continue to improve strength and stamina. Short: Improve on 6MWT Long: Continue to increase overall MET level Short: Return to regular attendance Long: Continue to improve stamina             Discharge Exercise Prescription (Final Exercise Prescription Changes):  Exercise Prescription Changes -  12/25/21 1500       Response to Exercise   Blood Pressure (Admit) 114/72    Blood Pressure (Exit) 112/68    Heart Rate (Admit) 106 bpm    Heart Rate (Exercise) 133 bpm    Heart Rate (Exit) 107 bpm    Oxygen Saturation (Admit) 98 %    Oxygen Saturation (Exercise) 96 %    Oxygen Saturation (Exit) 98 %    Rating of Perceived Exertion (Exercise) 13    Symptoms none    Duration Continue with 30 min of aerobic exercise without signs/symptoms of physical distress.    Intensity THRR unchanged      Progression   Progression Continue to progress workloads to maintain intensity without signs/symptoms of physical distress.    Average METs 2.9       Resistance Training   Training Prescription Yes    Weight 5 lb    Reps 10-15      Interval Training   Interval Training No      Treadmill   MPH 1.7    Grade 0    Minutes 15    METs 2.3      REL-XR   Level 3    Minutes 15    METs 3.4      Home Exercise Plan   Plans to continue exercise at Home (comment)   walking; PT exercises   Frequency Add 2 additional days to program exercise sessions.    Initial Home Exercises Provided 11/21/21      Oxygen   Maintain Oxygen Saturation 88% or higher             Nutrition:  Target Goals: Understanding of nutrition guidelines, daily intake of sodium '1500mg'$ , cholesterol '200mg'$ , calories 30% from fat and 7% or less from saturated fats, daily to have 5 or more servings of fruits and vegetables.  Education: All About Nutrition: -Group instruction provided by verbal, written material, interactive activities, discussions, models, and posters to present general guidelines for heart healthy nutrition including fat, fiber, MyPlate, the role of sodium in heart healthy nutrition, utilization of the nutrition label, and utilization of this knowledge for meal planning. Follow up email sent as well. Written material given at graduation.   Biometrics:  Pre Biometrics - 08/17/21 1716       Pre Biometrics   Height 5' 7.25" (1.708 m)    Weight 295 lb 11.2 oz (134.1 kg)    BMI (Calculated) 45.98    Single Leg Stand 2.3 seconds              Nutrition Therapy Plan and Nutrition Goals:  Nutrition Therapy & Goals - 09/11/21 1135       Nutrition Therapy   RD appointment deferred Yes   Seeing RD s/p bariatric surgery - she has her 6 month appointment soon. Will defer nutrition until 6 month bariatric surgery RD appointment.     Personal Nutrition Goals   Nutrition Goal Seeing RD s/p bariatric surgery - she has her 6 month appointment soon. Will defer nutrition until 6 month bariatric surgery RD appointment.              Nutrition Assessments:  MEDIFICTS Score Key: ?70 Need to make dietary changes  40-70 Heart Healthy Diet ? 40 Therapeutic Level Cholesterol Diet  Flowsheet Row Pulmonary Rehab from 08/17/2021 in [facility] Card[facility] Rehab  Picture Your Plate Total Score on Admission 64      Picture Your Plate Scores: <88 Unhealthy dietary  pattern with much room for improvement. 41-50 Dietary pattern unlikely to meet recommendations for good health and room for improvement. 51-60 More healthful dietary pattern, with some room for improvement.  >60 Healthy dietary pattern, although there may be some specific behaviors that could be improved.   Nutrition Goals Re-Evaluation:  Nutrition Goals Re-Evaluation     [place] Name 10/02/21 1139 10/27/21 1117 11/21/21 1130         Goals   Current Weight 262 lb (118.8 kg) -- --     Nutrition Goal Continues to defer RD appointment. Continues to defer RD appointment. Continues to defer RD appointment.     Comment Kingsley states she is working with her bariatric doctor's office for her nutritional needs. She currently defers a Counselling psychologist. Syra is doing well with her diet.  She does feel fatigued when she eats and will talk to dietitian about this. Rashae continues to work with her bariatric dietitian already.  She is following her diet and doing well.  She continues to get a good variety.     Expected Outcome ST: continue meeting with bariatric diet plan LT: weigh less than 200 lbs. ST: continue meeting with bariatric diet plan LT: weigh less than 200 lbs. ST: continue meeting with bariatric diet plan LT: weigh less than 200 lbs.              Nutrition Goals Discharge (Final Nutrition Goals Re-Evaluation):  Nutrition Goals Re-Evaluation - 11/21/21 1130       Goals   Nutrition Goal Continues to defer RD appointment.    Comment Brandy [name] continues to work with her bariatric dietitian already.  She is following her diet and doing well.  She continues to  get a good variety.    Expected Outcome ST: continue meeting with bariatric diet plan LT: weigh less than 200 lbs.             Psychosocial: Target Goals: Acknowledge presence or absence of significant depression and/or stress, maximize coping skills, provide positive support system. Participant is able to verbalize types and ability to use techniques and skills needed for reducing stress and depression.   Education: Stress, Anxiety, and Depression - Group verbal and visual presentation to define topics covered.  Reviews how body is impacted by stress, anxiety, and depression.  Also discusses healthy ways to reduce stress and to treat/manage anxiety and depression.  Written material given at graduation.   Education: Sleep Hygiene -Provides group verbal and written instruction about how sleep can affect your health.  Define sleep hygiene, discuss sleep cycles and impact of sleep habits. Review good sleep hygiene tips.    Initial Review & Psychosocial Screening:  Initial Psych Review & Screening - 10/02/21 1133       Initial Review   Current issues with Current Depression;Current Psychotropic Meds;History of Depression;Current Stress Concerns;Current Sleep Concerns             Quality of Life Scores:  Scores of 19 and below usually indicate a poorer quality of life in these areas.  A difference of  2-3 points is a clinically meaningful difference.  A difference of 2-3 points in the total score of the Quality of Life Index has been associated with significant improvement in overall quality of life, self-image, physical symptoms, and general health in studies assessing change in quality of life.  PHQ-9: Review Flowsheet  More data exists      11/21/2021 11/13/2021 10/27/2021 10/13/2021 09/11/2021  Depression screen PHQ 2/9  Decreased  Interest 2 1 1 1 1   Down, Depressed, Hopeless 2 1 1 1 1   PHQ - 2 Score 4 2 2 2 2   Altered sleeping 3 3 2 3 3   Tired, decreased energy 3 3 1 2 2    Change in appetite 0 0 0 0 0  Feeling bad or failure about yourself  3 3 3 3 2   Trouble concentrating 2 2 3 3 2   Moving slowly or fidgety/restless 0 2 2 3 1   Suicidal thoughts 0 0 0 - 0  PHQ-9 Score 15 15 13 16 12   Difficult doing work/chores Very difficult Very difficult Somewhat difficult Somewhat difficult Somewhat difficult   Interpretation of Total Score  Total Score Depression Severity:  1-4 = Minimal depression, 5-9 = Mild depression, 10-14 = Moderate depression, 15-19 = Moderately severe depression, 20-27 = Severe depression   Psychosocial Evaluation and Intervention:  Psychosocial Evaluation - 08/14/21 1129       Psychosocial Evaluation & Interventions   Interventions Stress management education;Encouraged to exercise with the program and follow exercise prescription    Comments She is out of work with a sever autistic son. She is generally negative and is trying to be more positive. She talks to a therapist and takes medsication to help her cope better. She is determined to exercise, get weight off and be more positive.    Expected Outcomes Short: Start [facility] to help with mood. Long: Maintain a healthy mental state.    Continue Psychosocial Services  Follow up required by staff             Psychosocial Re-Evaluation:  Psychosocial Re-Evaluation     Brandy [name] Name 09/11/21 1137 10/02/21 1133 10/27/21 1112 11/21/21 1121       Psychosocial Re-Evaluation   Current issues with Current Depression;Current Anxiety/Panic;Current Sleep Concerns;Current Psychotropic Meds Current Depression;Current Anxiety/Panic;Current Sleep Concerns;Current Psychotropic Meds;Current Stress Concerns Current Depression;Current Anxiety/Panic;Current Sleep Concerns;Current Psychotropic Meds;Current Stress Concerns Current Depression;Current Anxiety/Panic;Current Sleep Concerns;Current Psychotropic Meds;Current Stress Concerns    Comments Brandy [name] reports doing well with her mental health and is now on  medication as well as seeing a therapist 1x/week. She relies on her husband, best friend, therapist, and doctors for support. She reports walking, driving in nice weather, and staying busy to reduce stressors. She is not sleeping well right now due to stomach pain - she is working with her MD on this. Brandy [name] states that therapy has been good and she continues to go. She is compliant with her medication and states they are effective. Sleep has improved, but she desires for continued improvement. She feels the biggest barrier to sleeping well is caring for her special-needs son to monitor his behavior. Her husband is supportive and helps in caring for the son and other duties. Stress: her daughter's car is broke-down now and they is helping her with transportation of her to work and her child to school. Managing stress with exercise and talking with her husband. Nasira is doing well in rehab.  Her PHQ has improve 3 points over the last two weeks.  She is feeling better and moving more.  She is still struggling with her sleep.  When she eats she gets very fatigued and has to rest.  She continues to care for her son who is ready to go on vacation.  He has been having mostly good days, but will sneak into kitchen to get into things. Brandy [name] is doing well in rehab. Her PHQ score has shifted around, but score remains  the same.  She has her usual issues with her family and son getting out the door.  Her son has a harder time with things in morning.  Her finances got mixed up this month and they are short.  Her balance seems to be getting worse which she finds frustrating.  She enjoys exercise and knows that it makes her feel better.  She knows that she needs to focus and be displined.    Expected Outcomes ST: continue to do stress reducing activites  LT: maintain positive attitude ST: continue to attend rehab, exercise at home, and attend therapy. LT: continue to maintain positive attitude and utilize exercise for stress  management Short: Continue to exercise for mental outlet and boost Long: Continue to focus on positive Short: Make time to exercise and take care of herself LOng: continue to focus on the successes    Interventions Stress management education;Encouraged to attend Cardiac Rehabilitation for the exercise Stress management education;Encouraged to attend Pulmonary Rehabilitation for the exercise Stress management education;Encouraged to attend Pulmonary Rehabilitation for the exercise Stress management education;Encouraged to attend Pulmonary Rehabilitation for the exercise    Continue Psychosocial Services  Follow up required by staff Follow up required by staff Follow up required by staff Follow up required by staff      Initial Review   Source of Stress Concerns -- Chronic Illness;Occupation;Family -- Chronic Illness;Occupation;Family    Comments -- She continues to care for her son and help her other children. She seems positive with her current situation and reaches out for help as needed. She is taking her medication and seeing her therapist. -- --             Psychosocial Discharge (Final Psychosocial Re-Evaluation):  Psychosocial Re-Evaluation - 11/21/21 1121       Psychosocial Re-Evaluation   Current issues with Current Depression;Current Anxiety/Panic;Current Sleep Concerns;Current Psychotropic Meds;Current Stress Concerns    Comments Brandy [name] is doing well in rehab. Her PHQ score has shifted around, but score remains the same.  She has her usual issues with her family and son getting out the door.  Her son has a harder time with things in morning.  Her finances got mixed up this month and they are short.  Her balance seems to be getting worse which she finds frustrating.  She enjoys exercise and knows that it makes her feel better.  She knows that she needs to focus and be displined.    Expected Outcomes Short: Make time to exercise and take care of herself LOng: continue to focus on the  successes    Interventions Stress management education;Encouraged to attend Pulmonary Rehabilitation for the exercise    Continue Psychosocial Services  Follow up required by staff      Initial Review   Source of Stress Concerns Chronic Illness;Occupation;Family             Education: Education Goals: Education classes will be provided on a weekly basis, covering required topics. Participant will state understanding/return demonstration of topics presented.  Learning Barriers/Preferences:  Learning Barriers/Preferences - 08/14/21 1123       Learning Barriers/Preferences   Learning Barriers Sight    Learning Preferences Skilled Demonstration;Video             General Pulmonary Education Topics:  Infection Prevention: - Provides verbal and written material to individual with discussion of infection control including proper hand washing and proper equipment cleaning during exercise session. Flowsheet Row Pulmonary Rehab from 08/14/2021 in [facility] Cardiac and  Pulmonary Rehab  Date 08/14/21  Educator [facility]  Instruction Review Code 1- Verbalizes Understanding       Falls Prevention: - Provides verbal and written material to individual with discussion of falls prevention and safety. Flowsheet Row Pulmonary Rehab from 08/14/2021 in [facility] Cardiac and Pulmonary Rehab  Date 08/14/21  Educator [facility]  Instruction Review Code 1- Verbalizes Understanding       Chronic Lung Disease Review: - Group verbal instruction with posters, models, PowerPoint presentations and videos,  to review new updates, new respiratory medications, new advancements in procedures and treatments. Providing information on websites and "800" numbers for continued self-education. Includes information about supplement oxygen, available portable oxygen systems, continuous and intermittent flow rates, oxygen safety, concentrators, and Medicare reimbursement for oxygen. Explanation of Pulmonary Drugs, including class,  frequency, complications, importance of spacers, rinsing mouth after steroid MDI's, and proper cleaning methods for nebulizers. Review of basic lung anatomy and physiology related to function, structure, and complications of lung disease. Review of risk factors. Discussion about methods for diagnosing sleep apnea and types of masks and machines for OSA. Includes a review of the use of types of environmental controls: home humidity, furnaces, filters, dust mite/pet prevention, HEPA vacuums. Discussion about weather changes, air quality and the benefits of nasal washing. Instruction on Warning signs, infection symptoms, calling MD promptly, preventive modes, and value of vaccinations. Review of effective airway clearance, coughing and/or vibration techniques. Emphasizing that all should Create an Action Plan. Written material given at graduation. Flowsheet Row Pulmonary Rehab from 12/06/2021 in [facility] Cardiac and Pulmonary Rehab  Education need identified 08/17/21       AED/CPR: - Group verbal and written instruction with the use of models to demonstrate the basic use of the AED with the basic ABC's of resuscitation.    Anatomy and Cardiac Procedures: - Group verbal and visual presentation and models provide information about basic cardiac anatomy and function. Reviews the testing methods done to diagnose heart disease and the outcomes of the test results. Describes the treatment choices: Medical Management, Angioplasty, or Coronary Bypass Surgery for treating various heart conditions including Myocardial Infarction, Angina, Valve Disease, and Cardiac Arrhythmias.  Written material given at graduation.   Medication Safety: - Group verbal and visual instruction to review commonly prescribed medications for heart and lung disease. Reviews the medication, class of the drug, and side effects. Includes the steps to properly store meds and maintain the prescription regimen.  Written material given at  graduation.   Other: -Provides group and verbal instruction on various topics (see comments)   Knowledge Questionnaire Score:  Knowledge Questionnaire Score - 08/17/21 1702       Knowledge Questionnaire Score   Pre Score 16/18: Exercise, Oxygen              Core Components/Risk Factors/Patient Goals at Admission:  Personal Goals and Risk Factors at Admission - 08/17/21 1720       Core Components/Risk Factors/Patient Goals on Admission    Weight Management Yes;Obesity;Weight Loss    Intervention Weight Management: Develop a combined nutrition and exercise program designed to reach desired caloric intake, while maintaining appropriate intake of nutrient and fiber, sodium and fats, and appropriate energy expenditure required for the weight goal.;Weight Management: Provide education and appropriate resources to help participant work on and attain dietary goals.;Weight Management/Obesity: Establish reasonable short term and long term weight goals.;Obesity: Provide education and appropriate resources to help participant work on and attain dietary goals.    Admit Weight 295 lb (  133.8 kg)    Goal Weight: Short Term 290 lb (131.5 kg)    Goal Weight: Long Term 240 lb (108.9 kg)    Expected Outcomes Short Term: Continue to assess and modify interventions until short term weight is achieved;Long Term: Adherence to nutrition and physical activity/exercise program aimed toward attainment of established weight goal;Weight Loss: Understanding of general recommendations for a balanced deficit meal plan, which promotes 1-2 lb weight loss per week and includes a negative energy balance of [phone] kcal/d;Understanding recommendations for meals to include 15-35% energy as protein, 25-35% energy from fat, 35-60% energy from carbohydrates, less than $RemoveB'200mg'uBlFJybl$  of dietary cholesterol, 20-35 gm of total fiber daily;Understanding of distribution of calorie intake throughout the day with the consumption of 4-5  meals/snacks    Improve shortness of breath with ADL's Yes    Intervention Provide education, individualized exercise plan and daily activity instruction to help decrease symptoms of SOB with activities of daily living.    Expected Outcomes Short Term: Improve cardiorespiratory fitness to achieve a reduction of symptoms when performing ADLs;Long Term: Be able to perform more ADLs without symptoms or delay the onset of symptoms    Diabetes Yes    Intervention Provide education about signs/symptoms and action to take for hypo/hyperglycemia.;Provide education about proper nutrition, including hydration, and aerobic/resistive exercise prescription along with prescribed medications to achieve blood glucose in normal ranges: Fasting glucose 65-99 mg/dL    Expected Outcomes Short Term: Participant verbalizes understanding of the signs/symptoms and immediate care of hyper/hypoglycemia, proper foot care and importance of medication, aerobic/resistive exercise and nutrition plan for blood glucose control.;Long Term: Attainment of HbA1C < 7%.    Hypertension Yes    Intervention Monitor prescription use compliance.;Provide education on lifestyle modifcations including regular physical activity/exercise, weight management, moderate sodium restriction and increased consumption of fresh fruit, vegetables, and low fat dairy, alcohol moderation, and smoking cessation.    Expected Outcomes Short Term: Continued assessment and intervention until BP is < 140/44mm HG in hypertensive participants. < 130/31mm HG in hypertensive participants with diabetes, heart failure or chronic kidney disease.;Long Term: Maintenance of blood pressure at goal levels.    Lipids Yes    Intervention Provide education and support for participant on nutrition & aerobic/resistive exercise along with prescribed medications to achieve LDL '70mg'$ , HDL >$Remo'40mg'QCwvu$ .    Expected Outcomes Short Term: Participant states understanding of desired cholesterol values  and is compliant with medications prescribed. Participant is following exercise prescription and nutrition guidelines.;Long Term: Cholesterol controlled with medications as prescribed, with individualized exercise RX and with personalized nutrition plan. Value goals: LDL < $Rem'70mg'RuzI$ , HDL > 40 mg.             Education:Diabetes - Individual verbal and written instruction to review signs/symptoms of diabetes, desired ranges of glucose level fasting, after meals and with exercise. Acknowledge that pre and post exercise glucose checks will be done for 3 sessions at entry of program. Flowsheet Row Pulmonary Rehab from 08/14/2021 in [facility] Cardiac and Pulmonary Rehab  Date 08/14/21  Educator [facility]  Instruction Review Code 1- Verbalizes Understanding       Know Your Numbers and Heart Failure: - Group verbal and visual instruction to discuss disease risk factors for cardiac and pulmonary disease and treatment options.  Reviews associated critical values for Overweight/Obesity, Hypertension, Cholesterol, and Diabetes.  Discusses basics of heart failure: signs/symptoms and treatments.  Introduces Heart Failure Zone chart for action plan for heart failure.  Written material given at graduation.   Core  Components/Risk Factors/Patient Goals Review:   Goals and Risk Factor Review     Row Name 09/11/21 1141 10/02/21 1122 10/27/21 1118 11/21/21 1127       Core Components/Risk Factors/Patient Goals Review   Personal Goals Review Weight Management/Obesity;Diabetes Weight Management/Obesity;Hypertension;Diabetes;Improve shortness of breath with ADL's Weight Management/Obesity;Hypertension;Diabetes;Improve shortness of breath with ADL's;Increase knowledge of respiratory medications and ability to use respiratory devices properly. 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 She reports having  stomach pain which is making it hard to tolerate food and her medications; she is unable to take her gastric bypass vitamins - her doctor suggested taking 2 MVI per day in the interim. her doctor is going to order a CT to investigate causes. She continues to check her BG at home in the monring - running in the 90s; her last A1C is now 7%, she is due for a new one soon. She is dealing with muscle soreness from moving her body more due to rehab and exercise at home - she takes tylenol for this. Brandy [name] continues to lose weight since the bypass surgery. She is currently at 262 lb per her home scales. Her goal is to lose below 200 lbs. She states that weight loss is sporadic, losing larger amounts then plateauing. She understands that this is related to the surgery. She is having an upper GI on 6/6 to determine if she is having a food intolerance. Diabetes: Brandy [name] says her sugars have been good but they are creeping up and has an appointment scheduled this week. She has been drinking orange juice and thinks that is related, but has been diluting the orange juice with water to reduce the sugar level and having crystal light. She isn't currently on any diabetic medication and her average BG level is 90-100 fasting. She will discuss this further with her physician to determine their goals for her. ADLs: Pt says it's easier to stand for longer periods of time with less sitting breaks to wash dishes. Brandy [name] has been walking to rehab and finds it easier to get up from the couch. However, she has some instability/balance concerns and was encouraged to use assistance to stabilize self. She states her husband is mindful and supportive of her during her instability. She will be discussing this concern with her physician on Thursday, 6/7. Hypertension: Brandy [name] states her blood pressure has remained stable. Brandy [name] is doing well in rehab. She continues to work on weight loss is down to 258.  She is doing well with her sugar control and has  noticed that orange juice was driving it up, so she cut back. She is doing well with her breathing and is trying to do more at home.  She still gets winded walking but better on seated on equipment.  Her pressures have continued to be good. Brandy [name] is doing well in rehab. She continues to lose weight, but wants to maintain her muscular strength.  Her pressures are doing well and she continues to check them at home.  She is doing well with her medications.  She is off her metformin as her A1c has gotten down to 6.1!!  She is getting better with her breathing.    Expected Outcomes ST: get CT and f/u with doctor for stomach pain LT: continue to manage risk factors ST: continue to monitor blood sugar and talk with PCP about blood sugars and instability;  LT: continue  to manage lifestyle risk factors Short: Continue to build up breathing Long: COnitnue to work on weight loss Short: Continue to work on exercise and strength Long: Continue to work on [employer] loss             Core Components/Risk Factors/Patient Goals at Discharge (Final Review):   Goals and Risk Factor Review - 11/21/21 1127       Core Components/Risk Factors/Patient Goals Review   Personal Goals Review Weight Management/Obesity;Hypertension;Diabetes;Improve shortness of breath with ADL's    Review Brandy [name] is doing well in rehab. She continues to lose weight, but wants to maintain her muscular strength.  Her pressures are doing well and she continues to check them at home.  She is doing well with her medications.  She is off her metformin as her A1c has gotten down to 6.1!!  She is getting better with her breathing.    Expected Outcomes Short: Continue to work on exercise and strength Long: Continue to work on weight loss             ITP Comments:  ITP Comments     Row Name 08/14/21 1127 08/17/21 1700 08/21/21 1015 09/13/21 0922 10/11/21 1355   ITP Comments Virtual Visit completed. Patient informed on EP and RD appointment and 6 Minute  walk test. Patient also informed of patient health questionnaires on My Chart. Patient Verbalizes understanding. Visit diagnosis can be found in [facility] 04/27/2022. Completed 6MWT and gym orientation. Initial ITP created and sent for review to Dr. Ottie Glazier,  Medical Director. First full day of exercise!  Patient was oriented to gym and equipment including functions, settings, policies, and procedures.  Patient's individual exercise prescription and treatment plan were reviewed.  All starting workloads were established based on the results of the 6 minute walk test done at initial orientation visit.  The plan for exercise progression was also introduced and progression will be customized based on patient's performance and goals. 30 Day review completed. Medical Director ITP review done, changes made as directed, and signed approval by Medical Director. 30 Day review completed. Medical Director ITP review done, changes made as directed, and signed approval by Medical Director.    Row Name 11/08/21 1034 12/06/21 0959 12/25/21 1544 12/29/21 0757 01/03/22 1351   ITP Comments 30 Day review completed. Medical Director ITP review done, changes made as directed, and signed approval by Medical Director. 30 Day review completed. Medical Director ITP review done, changes made as directed, and signed approval by Medical Director. Pt has been out sick since 8/16.  She hopes to be able to return to rehab this week. Patient has called out for today. She has not attended since 12/13/2021. Patients attendance has been sporadic due to not feeling well. Will follow up with patient. Clarene has been out since the last Dr. Sabra Heck day. Goals will be reviewed when patient returns and has been attending regularly. 30 Day review completed. Medical Director ITP review done, changes made as directed, and signed approval by Medical Director.            Comments:

## 2022-01-04 ENCOUNTER — Ambulatory Visit (INDEPENDENT_AMBULATORY_CARE_PROVIDER_SITE_OTHER): Payer: Medicare Other | Admitting: Psychology

## 2022-01-04 DIAGNOSIS — F339 Major depressive disorder, recurrent, unspecified: Secondary | ICD-10-CM | POA: Diagnosis not present

## 2022-01-04 NOTE — Progress Notes (Signed)
[place] Counselor/Therapist Progress Note  Patient ID: Brandy [name], MRN: [id number]    Date: 01/04/22  Time Spent: 12:08 pm - 101 pm: 53 Minutes  Treatment Type: Individual Therapy.  Reported Symptoms: Anxiety   Mental Status Exam: Appearance:  Casual     Behavior: Appropriate  Motor: Normal  Speech/Language:  Clear and Coherent and Normal Rate  Affect: Appropriate  Mood: anxious  Thought process: normal  Thought content:   WNL  Sensory/Perceptual disturbances:   WNL  Orientation: oriented to person, place, time/date, and situation  Attention: Good  Concentration: Good  Memory: WNL  Fund of knowledge:  Good  Insight:   Good  Judgment:  Good  Impulse Control: Good   Risk Assessment: Danger to Self:  No Self-injurious Behavior: No Danger to Others: No Duty to Warn:no Physical Aggression / Violence:No  Access to Firearms a concern: No  Gang Involvement:No   Subjective:   Brandy [name] participated car, via video, and consented to treatment. Therapist participated from home office. We met online due to [place] pandemic. Brandy [name] reviewed the events of the past week. She reflected on the past session and continued to process this. We identified negative self-talk in relation to her behavior as a young child. We explored this during the session. She noted that her behavior what *noticed by other adults but not by her parents. She noted "I hate this about myself" and my life could have been different. I could have focused on other things. She noted regret regarding the behavior in relation to "what could have been". Brandy [name] highlighted feelings of shame, regret, and remorse. Therapist praised Brandy [name] for her effort and vulnerability, during the session. Therapist experience and provided supportive therapy.   Interventions: CBT & Interpersonal  Interventions: Cognitive Behavioral Therapy and Interpersonal   Diagnosis: Episode of recurrent major depressive  disorder, unspecified depression episode severity (Brandy [name])  Treatment Plan:  [name], forthcoming, self-aware, and motivated for change.   Support System: Friend Brandy [name]) and Family.   Client Treatment Preferences Outpatient Therapy.  Client Statement of Needs Brandy [name]'s goals for treatment managing her mood and symptoms, processing past events, focus on self-care, set boundaries for self and others, decrease negative self-talk, decrease rumination, process relationship stressors, and improve health.   Treatment Level Weekly  Symptoms  Depressive symptoms: Decreased interest, feeling down, poor sleep, lethargy, psycho-motor retardation, feeling bad about self.  (Status: maintained)  Anxiety: Difficulty managing worry, worrying about different things, restlessness, irritability, afraid something awful might happen  (Status: maintained)  Goals:  Brandy [name] experiences symptoms of depression and anxiety.   Target Date: 2022-06-08 Frequency: Weekly  Progress: 10 Modality: individual    Therapist will provide referrals for additional resources as appropriate.  Therapist will provide psycho-education regarding Brandy [name]'s diagnosis and corresponding treatment approaches and interventions. Licensed Clinical Social Worker, [place], LCSW will support the patient's ability to achieve the goals identified. will employ CBT, BA, Problem-solving, Solution Focused, Mindfulness,  coping skills, & other evidenced-based practices will be used to promote progress towards healthy functioning to help manage decrease symptoms associated with her diagnosis.   Reduce overall level, frequency, and intensity of the feelings of depression and anxiety as evidenced by decreased overall symptoms of depression and anxiety from 6 to 7 days/week to 0 to 1 days/week per client report for at least 3 consecutive months. Verbally express understanding of the relationship between feelings of  depression, anxiety and their impact on thinking patterns and behaviors.  Verbalize an understanding of the role that distorted thinking plays in creating fears, excessive worry, and ruminations.  Brandy [name] participated in the creation of the treatment plan)    Brandy Irish, LCSW

## 2022-01-05 ENCOUNTER — Ambulatory Visit: Payer: BC Managed Care – PPO | Admitting: Dietician

## 2022-01-09 ENCOUNTER — Encounter: Payer: Medicare Other | Admitting: *Deleted

## 2022-01-09 DIAGNOSIS — R0600 Dyspnea, unspecified: Secondary | ICD-10-CM

## 2022-01-09 NOTE — Progress Notes (Signed)
Daily Session Note  Patient Details  Name: Brandy [name] MRN: [id number] Date of Birth: April 13, 1966 Referring Provider:   Flowsheet Row Pulmonary Rehab from 08/17/2021 in [facility] Cardiac and Pulmonary Rehab  Referring Provider Ida Rogue MD       Encounter Date: 01/09/2022  Check In:  Session Check In - 01/09/22 1142       Check-In   Supervising physician immediately available to respond to emergencies See telemetry face sheet for immediately available ER MD    Location [facility]-Cardiac & Pulmonary Rehab    Staff Present Heath Lark, RN, BSN, CCRP;Noah Tickle, BS, Exercise Physiologist;Meredith Sherryll Burger, RN BSN    Virtual Visit No    Medication changes reported     No    Fall or balance concerns reported    No    Warm-up and Cool-down Performed on first and last piece of equipment    Resistance Training Performed Yes    VAD Patient? No    PAD/SET Patient? No      Pain Assessment   Currently in Pain? No/denies                Social History   Tobacco Use  Smoking Status Never  Smokeless Tobacco Never    Goals Met:  Proper associated with RPD/PD & O2 Sat Independence with exercise equipment Exercise tolerated well No report of concerns or symptoms today  Goals Unmet:  Not Applicable  Comments: Pt able to follow exercise prescription today without complaint.  Will continue to monitor for progression.    Dr. Emily Filbert is Medical Director for [place].  Dr. Ottie Glazier is Medical Director for [facility] Pulmonary Rehabilitation.

## 2022-01-10 ENCOUNTER — Encounter: Payer: Medicare Other | Admitting: *Deleted

## 2022-01-10 DIAGNOSIS — R0600 Dyspnea, unspecified: Secondary | ICD-10-CM

## 2022-01-10 NOTE — Progress Notes (Signed)
Daily Session Note  Patient Details  Name: Brandy [name] MRN: [id number] Date of Birth: [birth year] Referring Provider:   Flowsheet Row Pulmonary Rehab from 08/17/2021 in [facility] Cardiac and Pulmonary Rehab  Referring Provider Ida Rogue MD       Encounter Date: 01/10/2022 2 Check In:  Session Check In - 01/10/22 1148       Check-In   Supervising physician immediately available to respond to emergencies See telemetry face sheet for immediately available ER MD    Location [facility]-Cardiac & Pulmonary Rehab    Staff Present Heath Lark, RN, BSN, CCRP;Joseph Tessie Fass, RCP,RRT,BSRT;Other;Meredith Adak, RN BSN   Kathee Delton BS, Exercise Physiologist   Darlyne Russian RN ADN   Virtual Visit No    Medication changes reported     No    Fall or balance concerns reported    No    Warm-up and Cool-down Performed on first and last piece of equipment    Resistance Training Performed Yes    VAD Patient? No    PAD/SET Patient? No      Pain Assessment   Currently in Pain? No/denies                Social History   Tobacco Use  Smoking Status Never  Smokeless Tobacco Never    Goals Met:  Proper associated with RPD/PD & O2 Sat Independence with exercise equipment Exercise tolerated well No report of concerns or symptoms today  Goals Unmet:  Not Applicable  Comments: Pt able to follow exercise prescription today without complaint.  Will continue to monitor for progression.    Dr. Emily Filbert is Medical Director for [place].  Dr. Ottie Glazier is Medical Director for [facility] Pulmonary Rehabilitation.

## 2022-01-11 ENCOUNTER — Ambulatory Visit (INDEPENDENT_AMBULATORY_CARE_PROVIDER_SITE_OTHER): Payer: BC Managed Care – PPO | Admitting: Psychology

## 2022-01-11 DIAGNOSIS — F339 Major depressive disorder, recurrent, unspecified: Secondary | ICD-10-CM

## 2022-01-11 NOTE — Progress Notes (Signed)
[place] Counselor/Therapist Progress Note  Patient ID: Brandy [name], MRN: [id number]    Date: 01/11/22  Time Spent: 12:09 pm - 1:01 pm: 52 Minutes  Treatment Type: Individual Therapy.  Reported Symptoms: Anxiety   Mental Status Exam: Appearance:  Casual     Behavior: Appropriate  Motor: Normal  Speech/Language:  Clear and Coherent and Normal Rate  Affect: Appropriate  Mood: anxious and dysthymic  Thought process: normal  Thought content:   WNL  Sensory/Perceptual disturbances:   WNL  Orientation: oriented to person, place, time/date, and situation  Attention: Good  Concentration: Good  Memory: WNL  Fund of knowledge:  Good  Insight:   Good  Judgment:  Good  Impulse Control: Good   Risk Assessment: Danger to Self:  No Self-injurious Behavior: No Danger to Others: No Duty to Warn:no Physical Aggression / Violence:No  Access to Firearms a concern: No  Gang Involvement:No   Subjective:   Brandy [name] participated car, via video, and consented to treatment. Therapist participated from home office. We met online due to [place] pandemic. Brandy [name] reviewed the events of the past week. She noted frustration regarding not being unheard by her partner and noted her cursory attempts to address concerns and a lack of frustration regarding a change in Ed's behavior or actions. She noted feelings of being unheard and the effect of this on her mood and her relationship. She noted often her building frustration and giving feedback In ineffective way. We reviewed distress tolerance skills, communication, and boundary setting. Therapist modeled assertiveness and conflict resolution. We worked on feeling identification regarding this situation and Brandy [name] noted feelings of being unheard and undervalued. Therapist highlighted a lack of continued conversation to reach resolution and understanding between Brandy [name] and her husband. Therapist reviewed conflict resolution,  communication, and assertiveness. Therapist modeled this during the session and encouraged Brandy [name] to identify how she would like to address her concerns going forward. We discussed these unmet needs in relation to childhood events. Brandy [name] was engaged and motivated during the session. Therapist provided supportive therapy.   Interventions: CBT & Interpersonal  Interventions: Cognitive Behavioral Therapy and Interpersonal   Diagnosis: Episode of recurrent major depressive disorder, unspecified depression episode severity ([place])  Treatment Plan:  [name], forthcoming, self-aware, and motivated for change.   Support System: Friend Brandy [name]) and Family.   Client Treatment Preferences Outpatient Therapy.  Client Statement of Needs Brandy [name]'s goals for treatment managing her mood and symptoms, processing past events, focus on self-care, set boundaries for self and others, decrease negative self-talk, decrease rumination, process relationship stressors, and improve health.   Treatment Level Weekly  Symptoms  Depressive symptoms: Decreased interest, feeling down, poor sleep, lethargy, psycho-motor retardation, feeling bad about self.  (Status: maintained)  Anxiety: Difficulty managing worry, worrying about different things, restlessness, irritability, afraid something awful might happen  (Status: maintained)  Goals:  Brandy [name] experiences symptoms of depression and anxiety.   Target Date: 2022-06-08 Frequency: Weekly  Progress: 10 Modality: individual    Therapist will provide referrals for additional resources as appropriate.  Therapist will provide psycho-education regarding Karry's diagnosis and corresponding treatment approaches and interventions. Licensed Clinical Social Worker, [place], LCSW will support the patient's ability to achieve the goals identified. will employ CBT, BA, Problem-solving, Solution Focused, Mindfulness,  coping skills, & other  evidenced-based practices will be used to promote progress towards healthy functioning to help manage decrease symptoms associated with her diagnosis.   Reduce overall level, frequency,  and intensity of the feelings of depression and anxiety as evidenced by decreased overall symptoms of depression and anxiety from 6 to 7 days/week to 0 to 1 days/week per client report for at least 3 consecutive months. Verbally express understanding of the relationship between feelings of depression, anxiety and their impact on thinking patterns and behaviors. Verbalize an understanding of the role that distorted thinking plays in creating fears, excessive worry, and ruminations.  Brandy [name] participated in the creation of the treatment plan)    [name], LCSW

## 2022-01-12 ENCOUNTER — Encounter: Payer: Medicare Other | Admitting: *Deleted

## 2022-01-12 DIAGNOSIS — R0600 Dyspnea, unspecified: Secondary | ICD-10-CM | POA: Diagnosis not present

## 2022-01-12 NOTE — Progress Notes (Signed)
Daily Session Note  Patient Details  Name: Brandy [name] MRN: [id number] Date of Birth: 12/23/1965 Referring Provider:   Flowsheet Row Pulmonary Rehab from 08/17/2021 in [facility]  Referring Provider Gollan, Timothy MD       Encounter Date: 01/12/2022  Check In:  Session Check In - 01/12/22 1119       Check-In   Supervising physician immediately available to respond to emergencies See telemetry face sheet for immediately available ER MD    Location [facility] Pul[facility]    Staff Present Susanne Bice, RN, BSN, CCRP;Joseph Hood, RCP,RRT,BSRT;Other   Lawanda Green BS, Exercise Physiologist   Virtual Visit No    Medication changes reported     No    Fall or balance concerns reported    No    Warm-up and Cool-down Performed on first and last piece of equipment    Resistance Training Performed Yes    VAD Patient? No    PAD/SET Patient? No      Pain Assessment   Currently in Pain? No/denies                Social History   Tobacco Use  Smoking Status Never  Smokeless Tobacco Never    Goals Met:  Proper associated with RPD/PD & O2 Sat Independence with exercise equipment Exercise tolerated well No report of concerns or symptoms today  Goals Unmet:  Not Applicable  Comments: Pt able to follow exercise prescription today without complaint.  Will continue to monitor for progression.    Dr. Mark Miller is Medical Director for [facility].  Dr. Fuad Aleskerov is Medical Director for [facility]. 

## 2022-01-17 ENCOUNTER — Encounter: Payer: Medicare Other | Admitting: *Deleted

## 2022-01-17 VITALS — Ht 67.25 in | Wt 258.8 lb

## 2022-01-17 DIAGNOSIS — R0600 Dyspnea, unspecified: Secondary | ICD-10-CM

## 2022-01-17 NOTE — Progress Notes (Signed)
Daily Session Note  Patient Details  Name: Brandy [name] MRN: [id number] Date of Birth: December 24, 1965 Referring Provider:   Flowsheet Row Pulmonary Rehab from 08/17/2021 in [facility]  Referring Provider Ida Rogue MD       Encounter Date: 01/17/2022  Check In:  Session Check In - 01/17/22 1104       Check-In   Supervising physician immediately available to respond to emergencies See telemetry face sheet for immediately available ER MD    Location [facility]    Staff Present Renita Papa, RN BSN;Noah Tickle, BS, Exercise Physiologist;Joseph Tessie Fass, [place]    Virtual Visit No    Medication changes reported     No    Fall or balance concerns reported    No    Warm-up and Cool-down Performed on first and last piece of equipment    Resistance Training Performed Yes    VAD Patient? No    PAD/SET Patient? No      Pain Assessment   Currently in Pain? No/denies                Social History   Tobacco Use  Smoking Status Never  Smokeless Tobacco Never    Goals Met:  Independence with exercise equipment Exercise tolerated well No report of concerns or symptoms today Strength training completed today  Goals Unmet:  Not Applicable  Comments: Pt able to follow exercise prescription today without complaint.  Will continue to monitor for progression.    Dr. Emily Filbert is Medical Director for [facility].  Dr. Ottie Glazier is Medical Director for [facility].

## 2022-01-17 NOTE — Patient Instructions (Addendum)
Discharge Patient Instructions  Patient Details  Name: Brandy [name] MRN: [id number] Date of Birth: Dec 22, 1965 Referring Provider:  Doreene Nest, NP   Number of Visits: 36  Reason for Discharge:  Patient reached a stable level of exercise. Patient independent in their exercise. Patient has met program and personal goals.  Diagnosis:  Dyspnea, unspecified type  Initial Exercise Prescription:  Initial Exercise Prescription - 08/17/21 1700       Date of Initial Exercise RX and Referring Provider   Date 08/17/21    Referring Provider Julien Nordmann MD      Oxygen   Maintain Oxygen Saturation 88% or higher      NuStep   Level 1    SPM 80    Minutes 15    METs 1.4      REL-XR   Level 1    Speed 50    Minutes 15    METs 1.4      T5 Nustep   Level 1    SPM 80    Minutes 15    METs 1.4      Track   Laps 7    Minutes 15    METs 1.38      Prescription Details   Frequency (times per week) 3    Duration Progress to 30 minutes of continuous aerobic without signs/symptoms of physical distress      Intensity   THRR 40-80% of Max Heartrate 103 - 142    Ratings of Perceived Exertion 11-13    Perceived Dyspnea 0-4      Progression   Progression Continue to progress workloads to maintain intensity without signs/symptoms of physical distress.      Resistance Training   Training Prescription Yes    Weight 4 lb    Reps 10-15             Discharge Exercise Prescription (Final Exercise Prescription Changes):  Exercise Prescription Changes - 12/25/21 1500       Response to Exercise   Blood Pressure (Admit) 114/72    Blood Pressure (Exit) 112/68    Heart Rate (Admit) 106 bpm    Heart Rate (Exercise) 133 bpm    Heart Rate (Exit) 107 bpm    Oxygen Saturation (Admit) 98 %    Oxygen Saturation (Exercise) 96 %    Oxygen Saturation (Exit) 98 %    Rating of Perceived Exertion (Exercise) 13    Symptoms none    Duration Continue with 30 min of aerobic  exercise without signs/symptoms of physical distress.    Intensity THRR unchanged      Progression   Progression Continue to progress workloads to maintain intensity without signs/symptoms of physical distress.    Average METs 2.9      Resistance Training   Training Prescription Yes    Weight 5 lb    Reps 10-15      Interval Training   Interval Training No      Treadmill   MPH 1.7    Grade 0    Minutes 15    METs 2.3      REL-XR   Level 3    Minutes 15    METs 3.4      Home Exercise Plan   Plans to continue exercise at Home (comment)   walking; PT exercises   Frequency Add 2 additional days to program exercise sessions.    Initial Home Exercises Provided 11/21/21      Oxygen  Maintain Oxygen Saturation 88% or higher             Functional Capacity:  6 Minute Walk     Row Name 08/17/21 1715 01/17/22 1141       6 Minute Walk   Phase Initial Discharge    Distance 765 feet 1090 feet    Distance % Change -- 42.5 %    Distance Feet Change -- 325 ft    Walk Time 6 minutes 6 minutes    # of Rest Breaks 0 0    MPH 1.44 2.06    METS 2.17 2.81    RPE 13 13    Perceived Dyspnea  2 2    VO2 Peak 7.6 9.82    Symptoms Yes (comment) Yes (comment)    Comments SOB SOB    Resting HR 65 bpm 85 bpm    Resting BP 140/70 134/72    Resting Oxygen Saturation  96 % 98 %    Exercise Oxygen Saturation  during 6 min walk 96 % 88 %    Max Ex. HR 140 bpm 130 bpm    Max Ex. BP 154/78 138/80    2 Minute Post BP 138/78 126/78      Interval HR   1 Minute HR 98 114    2 Minute HR 115 122    3 Minute HR 129 130    4 Minute HR 137 127    5 Minute HR 139 122    6 Minute HR 140 121    2 Minute Post HR 75 99    Interval Heart Rate? Yes Yes      Interval Oxygen   Interval Oxygen? Yes Yes    Baseline Oxygen Saturation % 96 % 98 %    1 Minute Oxygen Saturation % 97 % 97 %    1 Minute Liters of Oxygen 0 L  RA 0 L    2 Minute Oxygen Saturation % 99 % 97 %    2 Minute Liters of  Oxygen 0 L 0 L    3 Minute Oxygen Saturation % 98 % 96 %    3 Minute Liters of Oxygen 0 L 0 L    4 Minute Oxygen Saturation % 98 % 91 %    4 Minute Liters of Oxygen 0 L 0 L    5 Minute Oxygen Saturation % 99 % 93 %    5 Minute Liters of Oxygen 0 L 0 L    6 Minute Oxygen Saturation % 98 % 88 %    6 Minute Liters of Oxygen 0 L 0 L    2 Minute Post Oxygen Saturation % 97 % 99 %    2 Minute Post Liters of Oxygen 0 L 0 L             Nutrition & Weight - Outcomes:  Pre Biometrics - 08/17/21 1716       Pre Biometrics   Height 5' 7.25" (1.708 m)    Weight 295 lb 11.2 oz (134.1 kg)    BMI (Calculated) 45.98    Single Leg Stand 2.3 seconds             Post Biometrics - 01/17/22 1145        Post  Biometrics   Height 5' 7.25" (1.708 m)    Weight 258 lb 12.8 oz (117.4 kg)    BMI (Calculated) 40.24    Single Leg Stand 2.5 seconds  Nutrition:  Nutrition Therapy & Goals - 09/11/21 1135       Nutrition Therapy   RD appointment deferred Yes   Seeing RD s/p bariatric surgery - she has her 6 month appointment soon. Will defer nutrition until 6 month bariatric surgery RD appointment.     Personal Nutrition Goals   Nutrition Goal Seeing RD s/p bariatric surgery - she has her 6 month appointment soon. Will defer nutrition until 6 month bariatric surgery RD appointment.

## 2022-01-18 ENCOUNTER — Ambulatory Visit (INDEPENDENT_AMBULATORY_CARE_PROVIDER_SITE_OTHER): Payer: Medicare Other | Admitting: Psychology

## 2022-01-18 DIAGNOSIS — F339 Major depressive disorder, recurrent, unspecified: Secondary | ICD-10-CM | POA: Diagnosis not present

## 2022-01-18 NOTE — Progress Notes (Signed)
[place] Counselor/Therapist Progress Note  Patient ID: KATJA BLUE, MRN: [id number]    Date: 01/18/22  Time Spent: 12:05 pm - 12:59 pm: 54 Minutes  Treatment Type: Individual Therapy.  Reported Symptoms: Anxiety   Mental Status Exam: Appearance:  Casual     Behavior: Appropriate  Motor: Normal  Speech/Language:  Clear and Coherent and Normal Rate  Affect: Appropriate  Mood: normal  Thought process: normal  Thought content:   WNL  Sensory/Perceptual disturbances:   WNL  Orientation: oriented to person, place, time/date, and situation  Attention: Good  Concentration: Good  Memory: WNL  Fund of knowledge:  Good  Insight:   Good  Judgment:  Good  Impulse Control: Good   Risk Assessment: Danger to Self:  No Self-injurious Behavior: No Danger to Others: No Duty to Warn:no Physical Aggression / Violence:No  Access to Firearms a concern: No  Gang Involvement:No   Subjective:   Shary Lamos Belko participated car, via video, and consented to treatment. Therapist participated from home office. We met online due to [place] pandemic. Tanvi Gatling Medici reviewed the events of the past week. She discussed often being reactive to relationship stressors. She noted avoidance of situations that might make her feel "bad". We worked on identifying the pros and cons of this approach during the session. We discussed ways to be more open and engage in activities that promote closeness. She noted a lack of connectivity  with her husband and noted this being a barrier to conflict resolution and better understanding.  We explore possible resources to employ that will allow additional communication and understanding.  We discussed communication and working through possible barriers.  We discussed seeking to understand people's perspective despite of her own feelings regarding some perspective.  Therapist modeled this during the session.  [name] was engaged and motivated during the  session.  She expressed interest in this assignment and discussed her plans to work on it starting after the session.  Therapist praised Aldona for her effort and energy.  And provided supportive therapy.  Dametra continues to benefit from counseling.  Interventions: CBT & Interpersonal  Interventions: Cognitive Behavioral Therapy and Interpersonal   Diagnosis: Episode of recurrent major depressive disorder, unspecified depression episode severity ([place])  Treatment Plan:  Client Abilities/Strengths Arvilla is intelligent, forthcoming, self-aware, and motivated for change.   Support System: Friend [name] and Family.   Client Treatment Preferences Outpatient Therapy.  Client Statement of Needs Lovelee's goals for treatment managing her mood and symptoms, processing past events, focus on self-care, set boundaries for self and others, decrease negative self-talk, decrease rumination, process relationship stressors, and improve health.   Treatment Level Weekly  Symptoms  Depressive symptoms: Decreased interest, feeling down, poor sleep, lethargy, psycho-motor retardation, feeling bad about self.  (Status: maintained)  Anxiety: Difficulty managing worry, worrying about different things, restlessness, irritability, afraid something awful might happen  (Status: maintained)  Goals:  Ashiya experiences symptoms of depression and anxiety.   Target Date: 2022-06-08 Frequency: Weekly  Progress: 10 Modality: individual    Therapist will provide referrals for additional resources as appropriate.  Therapist will provide psycho-education regarding Cortlyn's diagnosis and corresponding treatment approaches and interventions. Licensed Clinical Social Worker, [place], LCSW will support the patient's ability to achieve the goals identified. will employ CBT, BA, Problem-solving, Solution Focused, Mindfulness,  coping skills, & other evidenced-based practices will be used to promote progress towards healthy  functioning to help manage decrease symptoms associated with her diagnosis.   Reduce overall  level, frequency, and intensity of the feelings of depression and anxiety as evidenced by decreased overall symptoms of depression and anxiety from 6 to 7 days/week to 0 to 1 days/week per client report for at least 3 consecutive months. Verbally express understanding of the relationship between feelings of depression, anxiety and their impact on thinking patterns and behaviors. Verbalize an understanding of the role that distorted thinking plays in creating fears, excessive worry, and ruminations.  [name] participated in the creation of the treatment plan)    [name], LCSW

## 2022-01-19 ENCOUNTER — Encounter: Payer: Medicare Other | Admitting: *Deleted

## 2022-01-19 DIAGNOSIS — R0600 Dyspnea, unspecified: Secondary | ICD-10-CM | POA: Diagnosis not present

## 2022-01-19 NOTE — Progress Notes (Signed)
Daily Session Note  Patient Details  Name: Brandy [name] MRN: [id number] Date of Birth: 02-Apr-1966 Referring Provider:   Flowsheet Row Pulmonary Rehab from 08/17/2021 in [facility]  Referring Provider Ida Rogue MD       Encounter Date: 01/19/2022  Check In:  Session Check In - 01/19/22 1158       Check-In   Supervising physician immediately available to respond to emergencies See telemetry face sheet for immediately available ER MD    Location [facility]    Staff Present Heath Lark, RN, BSN, CCRP;Joseph [place], RCP,RRT,BSRT;Jessica [place], [place], [place], CCRP, CCET    Virtual Visit No    Medication changes reported     No    Fall or balance concerns reported    No    Warm-up and Cool-down Performed on first and last piece of equipment    Resistance Training Performed Yes    VAD Patient? No    PAD/SET Patient? No      Pain Assessment   Currently in Pain? No/denies                Social History   Tobacco Use  Smoking Status Never  Smokeless Tobacco Never    Goals Met:  Proper associated with RPD/PD & O2 Sat Independence with exercise equipment Exercise tolerated well No report of concerns or symptoms today  Goals Unmet:  Not Applicable  Comments: Pt able to follow exercise prescription today without complaint.  Will continue to monitor for progression.    Dr. Emily Filbert is Medical Director for [facility] [place].  Dr. Ottie Glazier is Medical Director for [facility] Pulmonary [facility].

## 2022-01-25 ENCOUNTER — Ambulatory Visit (INDEPENDENT_AMBULATORY_CARE_PROVIDER_SITE_OTHER): Payer: BC Managed Care – PPO | Admitting: Psychology

## 2022-01-25 DIAGNOSIS — F339 Major depressive disorder, recurrent, unspecified: Secondary | ICD-10-CM | POA: Diagnosis not present

## 2022-01-25 NOTE — Progress Notes (Signed)
[place] Counselor/Therapist Progress Note  Patient ID: MARIANN PALO, MRN: [id number]    Date: 01/25/22  Time Spent: 12:08 pm - 1:02 pm: 53 Minutes  Treatment Type: Individual Therapy.  Reported Symptoms: Anxiety   Mental Status Exam: Appearance:  Casual     Behavior: Appropriate  Motor: Normal  Speech/Language:  Clear and Coherent and Normal Rate  Affect: Appropriate  Mood: normal  Thought process: normal  Thought content:   WNL  Sensory/Perceptual disturbances:   WNL  Orientation: oriented to person, place, time/date, and situation  Attention: Good  Concentration: Good  Memory: WNL  Fund of knowledge:  Good  Insight:   Good  Judgment:  Good  Impulse Control: Good   Risk Assessment: Danger to Self:  No Self-injurious Behavior: No Danger to Others: No Duty to Warn:no Physical Aggression / Violence:No  Access to Firearms a concern: No  Gang Involvement:No   Subjective:   Ellayna Hilligoss Leever participated car, via video, and consented to treatment. Therapist participated from home office. We met online due to [place] pandemic. Rianne Degraaf Wiltse reviewed the events of the past week. She noted improvement in familial relationships. She noted marked improvement in her health due to participation in pulmonary rehab. She noted feeling "content" but not sure what would bring happiness. We worked on reframing unmet needs with her partner's attempts to satisfy her needs in spite of his lack of interest in these specific areas. She noted being more hopeful as she worked on looking for positives and reframing. Therapist encouraged Wilba to delineate her needs in regards to her needs. Therapist praised Jalaiya for her effort and energy.  And provided supportive therapy.  Chyrl continues to benefit from counseling and a follow-up was scheduled.   Interventions: CBT & Interpersonal  Interventions: Cognitive Behavioral Therapy and Interpersonal   Diagnosis: Episode of recurrent  major depressive disorder, unspecified depression episode severity ([place])  Treatment Plan:  Client Abilities/Strengths Kealey is intelligent, forthcoming, self-aware, and motivated for change.   Support System: Friend [name] and Family.   Client Treatment Preferences Outpatient Therapy.  Client Statement of Needs Genia's goals for treatment managing her mood and symptoms, processing past events, focus on self-care, set boundaries for self and others, decrease negative self-talk, decrease rumination, process relationship stressors, and improve health.   Treatment Level Weekly  Symptoms  Depressive symptoms: Decreased interest, feeling down, poor sleep, lethargy, psycho-motor retardation, feeling bad about self.  (Status: maintained)  Anxiety: Difficulty managing worry, worrying about different things, restlessness, irritability, afraid something awful might happen  (Status: maintained)  Goals:  Nami experiences symptoms of depression and anxiety.   Target Date: 2022-06-08 Frequency: Weekly  Progress: 10 Modality: individual    Therapist will provide referrals for additional resources as appropriate.  Therapist will provide psycho-education regarding Shamir's diagnosis and corresponding treatment approaches and interventions. Licensed Clinical Social Worker, [place], LCSW will support the patient's ability to achieve the goals identified. will employ CBT, BA, Problem-solving, Solution Focused, Mindfulness,  coping skills, & other evidenced-based practices will be used to promote progress towards healthy functioning to help manage decrease symptoms associated with her diagnosis.   Reduce overall level, frequency, and intensity of the feelings of depression and anxiety as evidenced by decreased overall symptoms of depression and anxiety from 6 to 7 days/week to 0 to 1 days/week per client report for at least 3 consecutive months. Verbally express understanding of the relationship  between feelings of depression, anxiety and their impact on thinking patterns  and behaviors. Verbalize an understanding of the role that distorted thinking plays in creating fears, excessive worry, and ruminations.  [name] participated in the creation of the treatment plan)    [name], LCSW

## 2022-01-31 ENCOUNTER — Encounter: Payer: Self-pay | Admitting: *Deleted

## 2022-01-31 ENCOUNTER — Ambulatory Visit: Payer: BC Managed Care – PPO | Admitting: *Deleted

## 2022-01-31 DIAGNOSIS — R0600 Dyspnea, unspecified: Secondary | ICD-10-CM

## 2022-01-31 NOTE — Progress Notes (Signed)
Pulmonary Individual Treatment Plan  Patient Details  Name: Brandy [name] MRN: [id number] Date of Birth: 1965-09-21 Referring Provider:   Flowsheet Row Pulmonary Rehab from 08/17/2021 in [facility] Cardiac and Pulmonary Rehab  Referring Provider Ida Rogue MD       Initial Encounter Date:  Flowsheet Row Pulmonary Rehab from 08/17/2021 in [facility] Cardiac and Pulmonary Rehab  Date 08/17/21       Visit Diagnosis: Dyspnea, unspecified type  Patient's Home Medications on Admission:  Current Outpatient Medications:    acetaminophen (TYLENOL) 650 MG CR tablet, Take 1,300 mg by mouth every 8 (eight) hours as needed for pain., Disp: , Rfl:    atorvastatin (LIPITOR) 10 MG tablet, TAKE 1 TABLET BY MOUTH EVERY DAY FOR CHOLESTEROL, Disp: 90 tablet, Rfl: 2   cetirizine (ZYRTEC) 10 MG tablet, TAKE 1 TABLET (10 MG TOTAL) BY MOUTH DAILY. FOR ALLERGIES, Disp: 90 tablet, Rfl: 1   citalopram (CELEXA) 40 MG tablet, Take 40 mg by mouth daily., Disp: , Rfl:    DULoxetine (CYMBALTA) 30 MG capsule, Take 30 mg by mouth daily. Take with 60 mg to equal 90 mg daily, Disp: , Rfl:    DULoxetine (CYMBALTA) 60 MG capsule, Take 60 mg by mouth daily. Take with 30 mg to equal 90 mg daily, Disp: , Rfl:    folic acid (FOLVITE) 1 MG tablet, Take 1 mg by mouth daily., Disp: , Rfl:    gabapentin (NEURONTIN) 300 MG capsule, TAKE 1 CAPSULE BY MOUTH 3 TIMES A DAY AS NEEDED FOR BACK PAIN, Disp: 270 capsule, Rfl: 3   lamoTRIgine (LAMICTAL) 100 MG tablet, Take 100 mg by mouth every evening., Disp: , Rfl:    lamoTRIgine (LAMICTAL) 200 MG tablet, Take 200 mg by mouth in the morning., Disp: , Rfl:    lisinopril (ZESTRIL) 30 MG tablet, TAKE 1 TABLET (30 MG TOTAL) BY MOUTH DAILY. FOR BLOOD PRESSURE, Disp: 90 tablet, Rfl: 2   metroNIDAZOLE (METROCREAM) 0.75 % cream, Apply 1 application topically daily as needed (rosacea)., Disp: , Rfl:    Multiple Minerals-Vitamins (CALCIUM-MAGNESIUM-ZINC-D3) TABS, Take 2 tablets by mouth daily., Disp: ,  Rfl:    ondansetron (ZOFRAN-ODT) 4 MG disintegrating tablet, Dissolve 1 tablet (4 mg total) by mouth every 6 (six) hours as needed for nausea or vomiting., Disp: 20 tablet, Rfl: 0   pantoprazole (PROTONIX) 40 MG tablet, Take 1 tablet (40 mg total) by mouth daily., Disp: 90 tablet, Rfl: 0   sucralfate (CARAFATE) 1 g tablet, Take by mouth., Disp: , Rfl:    traZODone (DESYREL) 50 MG tablet, Take 50 mg by mouth at bedtime as needed for sleep., Disp: , Rfl:    Vitamin D, Ergocalciferol, (DRISDOL) 1.25 MG (50000 UNIT) CAPS capsule, TAKE 1 CAPSULE BY MOUTH ONCE WEEKLY FOR VITAMIN D., Disp: 12 capsule, Rfl: 1  Past Medical History: Past Medical History:  Diagnosis Date   Anxiety    Arthritis    Back pain    Brain fog    CHF (congestive heart failure) [place]    pt not aware of this   Depression    Diabetes mellitus without complication (HCC)    Difficulty walking    Dysmetabolic syndrome X    Fatigue    Fibromyalgia    neuropathy   Hypertension    Hypertriglyceridemia    Joint pain    Leg weakness    Lower extremity edema    Muscle atrophy    Neuropathy    Obesity    Osteoarthritis  PONV (postoperative nausea and vomiting)    Poor memory    Post-menopausal bleeding    Prediabetes    Rosacea    Sleep apnea    not on cpap yet   Tetanus vaccine causing adverse effect in therapeutic use    Vitamin D deficiency     Tobacco Use: Social History   Tobacco Use  Smoking Status Never  Smokeless Tobacco Never    Labs: Review Flowsheet  More data exists      Latest Ref Rng & Units 06/01/2020 10/04/2020 03/20/2021 06/20/2021 11/09/2021  Labs for ITP Cardiac and Pulmonary Rehab  Cholestrol 0 - 200 mg/dL 137  - - 166  -  LDL (calc) 0 - 99 mg/dL 49  - - 92  -  HDL-C >39.00 mg/dL 57.30  - - 40.10  -  Trlycerides 0.0 - 149.0 mg/dL 152.0  - - 169.0  -  Hemoglobin A1c 4.0 - 5.6 % 7.0  8.6  7.9  7.0  6.1      Pulmonary Assessment Scores:  Pulmonary Assessment Scores     Row Name  08/17/21 1703         ADL UCSD   ADL Phase Entry     SOB Score total 85     Rest 0     Walk 3     Stairs 5     Bath 3     Dress 2     Shop 5       CAT Score   CAT Score 13       mMRC Score   mMRC Score 2              UCSD: Self-administered rating of dyspnea associated with activities of daily living (ADLs) 6-point scale (0 = "not at all" to 5 = "maximal or unable to do because of breathlessness")  Scoring Scores range from 0 to 120.  Minimally important difference is 5 units  CAT: CAT can identify the health impairment of COPD patients and is better correlated with disease progression.  CAT has a scoring range of zero to 40. The CAT score is classified into four groups of low (less than 10), medium (10 - 20), high (21-30) and very high (31-40) based on the impact level of disease on health status. A CAT score over 10 suggests significant symptoms.  A worsening CAT score could be explained by an exacerbation, poor medication adherence, poor inhaler technique, or progression of COPD or comorbid conditions.  CAT MCID is 2 points  mMRC: mMRC (Modified Medical Research Council) Dyspnea Scale is used to assess the degree of baseline functional disability in patients of respiratory disease due to dyspnea. No minimal important difference is established. A decrease in score of 1 point or greater is considered a positive change.   Pulmonary Function Assessment:  Pulmonary Function Assessment - 08/14/21 1123       Breath   Shortness of Breath Limiting activity;Yes             Exercise Target Goals: Exercise Program Goal: Individual exercise prescription set using results from initial 6 min walk test and THRR while considering  patient's activity barriers and safety.   Exercise Prescription Goal: Initial exercise prescription builds to 30-45 minutes a day of aerobic activity, 2-3 days per week.  Home exercise guidelines will be given to patient during program as part of  exercise prescription that the participant will acknowledge.  Education: Aerobic Exercise: - Group verbal and visual presentation  on the components of exercise prescription. Introduces F.I.T.T principle from ACSM for exercise prescriptions.  Reviews F.I.T.T. principles of aerobic exercise including progression. Written material given at graduation. Flowsheet Row Pulmonary Rehab from 12/06/2021 in [facility] Cardiac and Pulmonary Rehab  Education need identified 08/17/21       Education: Resistance Exercise: - Group verbal and visual presentation on the components of exercise prescription. Introduces F.I.T.T principle from ACSM for exercise prescriptions  Reviews F.I.T.T. principles of resistance exercise including progression. Written material given at graduation.    Education: Exercise & Equipment Safety: - Individual verbal instruction and demonstration of equipment use and safety with use of the equipment. Flowsheet Row Pulmonary Rehab from 08/14/2021 in [facility] Cardiac and Pulmonary Rehab  Date 08/14/21  Educator [facility]  Instruction Review Code 1- Verbalizes Understanding       Education: Exercise Physiology & General Exercise Guidelines: - Group verbal and written instruction with models to review the exercise physiology of the cardiovascular system and associated critical values. Provides general exercise guidelines with specific guidelines to those with heart or lung disease.  Flowsheet Row Pulmonary Rehab from 12/06/2021 in [facility] Cardiac and Pulmonary Rehab  Education need identified 08/17/21       Education: Flexibility, Balance, Mind/Body Relaxation: - Group verbal and visual presentation with interactive activity on the components of exercise prescription. Introduces F.I.T.T principle from ACSM for exercise prescriptions. Reviews F.I.T.T. principles of flexibility and balance exercise training including progression. Also discusses the mind body connection.  Reviews various relaxation  techniques to help reduce and manage stress (i.e. Deep breathing, progressive muscle relaxation, and visualization). Balance handout provided to take home. Written material given at graduation.   Activity Barriers & Risk Stratification:  Activity Barriers & Cardiac Risk Stratification - 08/17/21 1717       Activity Barriers & Cardiac Risk Stratification   Activity Barriers Deconditioning;Muscular Weakness;Shortness of Breath;Fibromyalgia;Joint Problems             6 Minute Walk:  6 Minute Walk     Row Name 08/17/21 1715 01/17/22 1141       6 Minute Walk   Phase Initial Discharge    Distance 765 feet 1090 feet    Distance % Change -- 42.5 %    Distance Feet Change -- 325 ft    Walk Time 6 minutes 6 minutes    # of Rest Breaks 0 0    MPH 1.44 2.06    METS 2.17 2.81    RPE 13 13    Perceived Dyspnea  2 2    VO2 Peak 7.6 9.82    Symptoms Yes (comment) Yes (comment)    Comments SOB SOB    Resting HR 65 bpm 85 bpm    Resting BP 140/70 134/72    Resting Oxygen Saturation  96 % 98 %    Exercise Oxygen Saturation  during 6 min walk 96 % 88 %    Max Ex. HR 140 bpm 130 bpm    Max Ex. BP 154/78 138/80    2 Minute Post BP 138/78 126/78      Interval HR   1 Minute HR 98 114    2 Minute HR 115 122    3 Minute HR 129 130    4 Minute HR 137 127    5 Minute HR 139 122    6 Minute HR 140 121    2 Minute Post HR 75 99    Interval Heart Rate? Yes Yes  Interval Oxygen   Interval Oxygen? Yes Yes    Baseline Oxygen Saturation % 96 % 98 %    1 Minute Oxygen Saturation % 97 % 97 %    1 Minute Liters of Oxygen 0 L  RA 0 L    2 Minute Oxygen Saturation % 99 % 97 %    2 Minute Liters of Oxygen 0 L 0 L    3 Minute Oxygen Saturation % 98 % 96 %    3 Minute Liters of Oxygen 0 L 0 L    4 Minute Oxygen Saturation % 98 % 91 %    4 Minute Liters of Oxygen 0 L 0 L    5 Minute Oxygen Saturation % 99 % 93 %    5 Minute Liters of Oxygen 0 L 0 L    6 Minute Oxygen Saturation % 98 % 88 %     6 Minute Liters of Oxygen 0 L 0 L    2 Minute Post Oxygen Saturation % 97 % 99 %    2 Minute Post Liters of Oxygen 0 L 0 L            Oxygen Initial Assessment:  Oxygen Initial Assessment - 08/17/21 1702       Home Oxygen   Home Oxygen Device None    Sleep Oxygen Prescription None    Home Exercise Oxygen Prescription None    Home Resting Oxygen Prescription None    Compliance with Home Oxygen Use Yes      Initial 6 min Walk   Oxygen Used None      Program Oxygen Prescription   Program Oxygen Prescription None      Intervention   Short Term Goals To learn and understand importance of monitoring SPO2 with pulse oximeter and demonstrate accurate use of the pulse oximeter.;To learn and understand importance of maintaining oxygen saturations>88%;To learn and demonstrate proper pursed lip breathing techniques or other breathing techniques.     Long  Term Goals Verbalizes importance of monitoring SPO2 with pulse oximeter and return demonstration;Maintenance of O2 saturations>88%;Exhibits proper breathing techniques, such as pursed lip breathing or other method taught during program session;Compliance with respiratory medication             Oxygen Re-Evaluation:  Oxygen Re-Evaluation     Row Name 08/21/21 1018 10/02/21 1155 10/27/21 1122 11/21/21 1131 01/19/22 1151     Program Oxygen Prescription   Program Oxygen Prescription -- None None None None     Home Oxygen   Home Oxygen Device -- None None None None   Sleep Oxygen Prescription -- None None None None   Home Exercise Oxygen Prescription -- None None None None   Home Resting Oxygen Prescription -- None None None None   Compliance with Home Oxygen Use -- Yes Yes -- --     Goals/Expected Outcomes   Short Term Goals -- To learn and understand importance of monitoring SPO2 with pulse oximeter and demonstrate accurate use of the pulse oximeter.;To learn and understand importance of maintaining oxygen saturations>88%;To  learn and demonstrate proper pursed lip breathing techniques or other breathing techniques.  To learn and understand importance of monitoring SPO2 with pulse oximeter and demonstrate accurate use of the pulse oximeter.;To learn and understand importance of maintaining oxygen saturations>88%;To learn and demonstrate proper pursed lip breathing techniques or other breathing techniques.  To learn and understand importance of monitoring SPO2 with pulse oximeter and demonstrate accurate use of the pulse oximeter.;To learn  and understand importance of maintaining oxygen saturations>88%;To learn and demonstrate proper pursed lip breathing techniques or other breathing techniques.  To learn and understand importance of monitoring SPO2 with pulse oximeter and demonstrate accurate use of the pulse oximeter.;To learn and understand importance of maintaining oxygen saturations>88%;To learn and demonstrate proper pursed lip breathing techniques or other breathing techniques.    Long  Term Goals -- Verbalizes importance of monitoring SPO2 with pulse oximeter and return demonstration;Maintenance of O2 saturations>88%;Exhibits proper breathing techniques, such as pursed lip breathing or other method taught during program session;Compliance with respiratory medication Verbalizes importance of monitoring SPO2 with pulse oximeter and return demonstration;Maintenance of O2 saturations>88%;Exhibits proper breathing techniques, such as pursed lip breathing or other method taught during program session;Compliance with respiratory medication Verbalizes importance of monitoring SPO2 with pulse oximeter and return demonstration;Maintenance of O2 saturations>88%;Exhibits proper breathing techniques, such as pursed lip breathing or other method taught during program session;Compliance with respiratory medication Verbalizes importance of monitoring SPO2 with pulse oximeter and return demonstration;Maintenance of O2 saturations>88%;Exhibits  proper breathing techniques, such as pursed lip breathing or other method taught during program session;Compliance with respiratory medication   Comments Reviewed PLB technique with pt.  Talked about how it works and it's importance in maintaining their exercise saturations. Discussed purchasing a pulse ox to monitor her heart rate and oxygen. She is checking her HR via her BP cuff, but not during exercise. She's walking to rehab without any assistance, but wants to work on balance exercises. Ahmoni is doing well in rehab. She is still getting winded walking and doing dishes.  She is good about using her PLB to help with exercise and especially with weights. She still has not gotten a pulse oximeter but just got paid so she will get one soon. Emaleigh is doing well in rehab. She does not have a pulse oximeter yet, but plans to purchase soon.  She is doing well with her PLB and she uses it regularly.  She is feeling more confident with it as well. Ninetta has done well in rehab. She is doing better with her breathing and using PLB routinely.  She feels like she now has better control over her breathing and knows when to back off.   Goals/Expected Outcomes Short: Become more profiecient at using PLB.   Long: Become independent at using PLB. ST: purchase a pulse ox. LT: continue to monitor HR and oxygen at home independently Short: Continue work on PLB with exercise Long: COnintue to manage breathing. Short: Continue to use PLB daily. Long: COntinue to improve breathing Short: Continue to use PLB daily. Long: COntinue to improve breathing            Oxygen Discharge (Final Oxygen Re-Evaluation):  Oxygen Re-Evaluation - 01/19/22 1151       Program Oxygen Prescription   Program Oxygen Prescription None      Home Oxygen   Home Oxygen Device None    Sleep Oxygen Prescription None    Home Exercise Oxygen Prescription None    Home Resting Oxygen Prescription None      Goals/Expected Outcomes   Short Term Goals  To learn and understand importance of monitoring SPO2 with pulse oximeter and demonstrate accurate use of the pulse oximeter.;To learn and understand importance of maintaining oxygen saturations>88%;To learn and demonstrate proper pursed lip breathing techniques or other breathing techniques.     Long  Term Goals Verbalizes importance of monitoring SPO2 with pulse oximeter and return demonstration;Maintenance of O2 saturations>88%;Exhibits proper breathing  techniques, such as pursed lip breathing or other method taught during program session;Compliance with respiratory medication    Comments Tennelle has done well in rehab. She is doing better with her breathing and using PLB routinely.  She feels like she now has better control over her breathing and knows when to back off.    Goals/Expected Outcomes Short: Continue to use PLB daily. Long: COntinue to improve breathing             Initial Exercise Prescription:  Initial Exercise Prescription - 08/17/21 1700       Date of Initial Exercise RX and Referring Provider   Date 08/17/21    Referring Provider Ida Rogue MD      Oxygen   Maintain Oxygen Saturation 88% or higher      NuStep   Level 1    SPM 80    Minutes 15    METs 1.4      REL-XR   Level 1    Speed 50    Minutes 15    METs 1.4      T5 Nustep   Level 1    SPM 80    Minutes 15    METs 1.4      Track   Laps 7    Minutes 15    METs 1.38      Prescription Details   Frequency (times per week) 3    Duration Progress to 30 minutes of continuous aerobic without signs/symptoms of physical distress      Intensity   THRR 40-80% of Max Heartrate 103 - 142    Ratings of Perceived Exertion 11-13    Perceived Dyspnea 0-4      Progression   Progression Continue to progress workloads to maintain intensity without signs/symptoms of physical distress.      Resistance Training   Training Prescription Yes    Weight 4 lb    Reps 10-15             Perform  Capillary Blood Glucose checks as needed.  Exercise Prescription Changes:   Exercise Prescription Changes     Row Name 08/17/21 1700 09/05/21 1100 09/21/21 1400 10/03/21 0800 10/17/21 1600     Response to Exercise   Blood Pressure (Admit) 140/70 144/98 130/70 134/64 122/76   Blood Pressure (Exercise) 154/78 130/70 -- 142/76 --   Blood Pressure (Exit) 138/78 126/70 124/78 124/64 118/64   Heart Rate (Admit) 65 bpm 76 bpm 99 bpm 96 bpm 92 bpm   Heart Rate (Exercise) 140 bpm 102 bpm 133 bpm 110 bpm 110 bpm   Heart Rate (Exit) 68 bpm 99 bpm 94 bpm 93 bpm 96 bpm   Oxygen Saturation (Admit) 96 % 96 % 96 % 97 % 96 %   Oxygen Saturation (Exercise) 98 % 95 % 97 % 93 % 93 %   Oxygen Saturation (Exit) 97 % 96 % 96 % 96 % 95 %   Rating of Perceived Exertion (Exercise) 13 13 13 13 13    Perceived Dyspnea (Exercise) 2 1 1  -- 1   Symptoms SOB SOB none none none   Comments walk test results -- -- -- --   Duration -- Continue with 30 min of aerobic exercise without signs/symptoms of physical distress. Continue with 30 min of aerobic exercise without signs/symptoms of physical distress. Continue with 30 min of aerobic exercise without signs/symptoms of physical distress. Continue with 30 min of aerobic exercise without signs/symptoms of physical distress.   Intensity --  THRR unchanged THRR unchanged THRR unchanged THRR unchanged     Progression   Progression -- Continue to progress workloads to maintain intensity without signs/symptoms of physical distress. Continue to progress workloads to maintain intensity without signs/symptoms of physical distress. Continue to progress workloads to maintain intensity without signs/symptoms of physical distress. Continue to progress workloads to maintain intensity without signs/symptoms of physical distress.   Average METs -- 1.45 1.58 1.54 1.92     Resistance Training   Training Prescription -- Yes Yes Yes Yes   Weight -- 4 lb 4 lb 4 lb 4 lb   Reps -- 10-15 10-15  10-15 10-15     Interval Training   Interval Training -- No No No No     Treadmill   MPH -- -- 1.1 1.2 --   Grade -- -- 0 0 --   Minutes -- -- 15 15 --   METs -- -- 1.8 1.92 --     Arm Ergometer   Level -- 1 -- -- --   Minutes -- 15 -- -- --   METs -- 1.7 -- -- --     REL-XR   Level -- $Remove'1 3 3 2   'oUrJcTM$ Minutes -- $RemoveBe'15 15 15 15   'hIoMzPGjJ$ METs -- 1.2 1.2 1.1 --     T5 Nustep   Level -- -- 1 -- 2   Minutes -- -- 15 -- 30   METs -- -- 1.5 -- 2     Track   Laps -- -- 15 11 --   Minutes -- -- 15 15 --   METs -- -- 1.76 1.6 --     Home Exercise Plan   Plans to continue exercise at -- -- -- -- Home (comment)  walking; baseball field   Frequency -- -- -- -- Add 2 additional days to program exercise sessions.   Initial Home Exercises Provided -- -- -- -- 10/02/21     Oxygen   Maintain Oxygen Saturation -- 88% or higher 88% or higher 88% or higher 88% or higher    Row Name 10/30/21 1500 11/13/21 1500 11/21/21 1100 11/29/21 1100 12/12/21 0800     Response to Exercise   Blood Pressure (Admit) 124/72 122/78 -- 108/72 110/60   Blood Pressure (Exit) 136/70 116/72 -- 102/66 130/78   Heart Rate (Admit) 99 bpm 94 bpm -- 93 bpm 91 bpm   Heart Rate (Exercise) 110 bpm 108 bpm -- 127 bpm 131 bpm   Heart Rate (Exit) 103 bpm 98 bpm -- 107 bpm 108 bpm   Oxygen Saturation (Admit) 95 % 98 % -- 97 % 96 %   Oxygen Saturation (Exercise) 94 % 98 % -- 95 % 93 %   Oxygen Saturation (Exit) 97 % 98 % -- 98 % 95 %   Rating of Perceived Exertion (Exercise) 13 12 -- 13 13   Perceived Dyspnea (Exercise) 1 1 -- 2 1   Symptoms none none -- SOB none   Duration Continue with 30 min of aerobic exercise without signs/symptoms of physical distress. Continue with 30 min of aerobic exercise without signs/symptoms of physical distress. -- Continue with 30 min of aerobic exercise without signs/symptoms of physical distress. Continue with 30 min of aerobic exercise without signs/symptoms of physical distress.   Intensity THRR  unchanged THRR unchanged -- THRR unchanged THRR unchanged     Progression   Progression Continue to progress workloads to maintain intensity without signs/symptoms of physical distress. Continue to progress workloads to maintain  intensity without signs/symptoms of physical distress. -- Continue to progress workloads to maintain intensity without signs/symptoms of physical distress. Continue to progress workloads to maintain intensity without signs/symptoms of physical distress.   Average METs 2 3.4 -- 2.48 2.75     Resistance Training   Training Prescription Yes Yes -- Yes Yes   Weight 4 lb 4 lb -- 4 lb 4 lb   Reps 10-15 10-15 -- 10-15 10-15     Interval Training   Interval Training No No -- No No     Treadmill   MPH -- -- -- 1.2 1.7   Grade -- -- -- 0 0   Minutes -- -- -- 15 15   METs -- -- -- 1.92 2.3     REL-XR   Level 2 5 -- 4 3   Minutes 15 15 -- 30 30   METs 1.3 3.4 -- 2.3 2     T5 Nustep   Level 3 -- -- -- --   Minutes 30 -- -- -- --   METs 2.6 -- -- -- --     Home Exercise Plan   Plans to continue exercise at Home (comment)  walking; baseball field Home (comment)  walking; baseball field Home (comment)  walking; PT exercises Home (comment)  walking; PT exercises Home (comment)  walking; PT exercises   Frequency Add 2 additional days to program exercise sessions. Add 2 additional days to program exercise sessions. Add 2 additional days to program exercise sessions. Add 2 additional days to program exercise sessions. Add 2 additional days to program exercise sessions.   Initial Home Exercises Provided 10/02/21 10/02/21 11/21/21 11/21/21 11/21/21     Oxygen   Maintain Oxygen Saturation 88% or higher 88% or higher 88% or higher 88% or higher 88% or higher    Row Name 12/25/21 1500 01/25/22 0800           Response to Exercise   Blood Pressure (Admit) 114/72 122/60      Blood Pressure (Exit) 112/68 94/58      Heart Rate (Admit) 106 bpm 103 bpm      Heart Rate  (Exercise) 133 bpm 110 bpm      Heart Rate (Exit) 107 bpm 96 bpm      Oxygen Saturation (Admit) 98 % 98 %      Oxygen Saturation (Exercise) 96 % 97 %      Oxygen Saturation (Exit) 98 % 98 %      Rating of Perceived Exertion (Exercise) 13 14      Perceived Dyspnea (Exercise) -- 2      Symptoms none SOB      Duration Continue with 30 min of aerobic exercise without signs/symptoms of physical distress. Continue with 30 min of aerobic exercise without signs/symptoms of physical distress.      Intensity THRR unchanged THRR unchanged        Progression   Progression Continue to progress workloads to maintain intensity without signs/symptoms of physical distress. Continue to progress workloads to maintain intensity without signs/symptoms of physical distress.      Average METs 2.9 3.34        Resistance Training   Training Prescription Yes Yes      Weight 5 lb 5 lb      Reps 10-15 10-15        Interval Training   Interval Training No No        Treadmill   MPH 1.7 2  Grade 0 0      Minutes 15 15      METs 2.3 2.53        REL-XR   Level 3 5      Minutes 15 15      METs 3.4 4.8        Biostep-RELP   Level -- 4      Minutes -- 15      METs -- 4        Home Exercise Plan   Plans to continue exercise at Home (comment)  walking; PT exercises Home (comment)  walking; PT exercises      Frequency Add 2 additional days to program exercise sessions. Add 2 additional days to program exercise sessions.      Initial Home Exercises Provided 11/21/21 11/21/21        Oxygen   Maintain Oxygen Saturation 88% or higher 88% or higher               Exercise Comments:   Exercise Goals and Review:   Exercise Goals     Row Name 08/17/21 1719             Exercise Goals   Increase Physical Activity Yes       Intervention Provide advice, education, support and counseling about physical activity/exercise needs.;Develop an individualized exercise prescription for aerobic and  resistive training based on initial evaluation findings, risk stratification, comorbidities and participant's personal goals.       Expected Outcomes Short Term: Attend rehab on a regular basis to increase amount of physical activity.;Long Term: Add in home exercise to make exercise part of routine and to increase amount of physical activity.;Long Term: Exercising regularly at least 3-5 days a week.       Increase Strength and Stamina Yes       Intervention Provide advice, education, support and counseling about physical activity/exercise needs.;Develop an individualized exercise prescription for aerobic and resistive training based on initial evaluation findings, risk stratification, comorbidities and participant's personal goals.       Expected Outcomes Short Term: Increase workloads from initial exercise prescription for resistance, speed, and METs.;Short Term: Perform resistance training exercises routinely during rehab and add in resistance training at home;Long Term: Improve cardiorespiratory fitness, muscular endurance and strength as measured by increased METs and functional capacity (6MWT)       Able to understand and use rate of perceived exertion (RPE) scale Yes       Intervention Provide education and explanation on how to use RPE scale       Expected Outcomes Short Term: Able to use RPE daily in rehab to express subjective intensity level;Long Term:  Able to use RPE to guide intensity level when exercising independently       Able to understand and use Dyspnea scale Yes       Intervention Provide education and explanation on how to use Dyspnea scale       Expected Outcomes Short Term: Able to use Dyspnea scale daily in rehab to express subjective sense of shortness of breath during exertion;Long Term: Able to use Dyspnea scale to guide intensity level when exercising independently       Knowledge and understanding of Target Heart Rate Range (THRR) Yes       Intervention Provide education and  explanation of THRR including how the numbers were predicted and where they are located for reference       Expected Outcomes Short Term: Able to state/look  up THRR;Short Term: Able to use daily as guideline for intensity in rehab;Long Term: Able to use THRR to govern intensity when exercising independently       Able to check pulse independently Yes       Intervention Provide education and demonstration on how to check pulse in carotid and radial arteries.;Review the importance of being able to check your own pulse for safety during independent exercise       Expected Outcomes Short Term: Able to explain why pulse checking is important during independent exercise;Long Term: Able to check pulse independently and accurately       Understanding of Exercise Prescription Yes       Intervention Provide education, explanation, and written materials on patient's individual exercise prescription       Expected Outcomes Short Term: Able to explain program exercise prescription;Long Term: Able to explain home exercise prescription to exercise independently                Exercise Goals Re-Evaluation :  Exercise Goals Re-Evaluation     Row Name 08/21/21 1015 09/05/21 1129 09/11/21 1151 09/21/21 1419 10/02/21 1110     Exercise Goal Re-Evaluation   Exercise Goals Review Increase Physical Activity;Able to understand and use rate of perceived exertion (RPE) scale;Knowledge and understanding of Target Heart Rate Range (THRR);Understanding of Exercise Prescription;Increase Strength and Stamina;Able to check pulse independently;Able to understand and use Dyspnea scale Increase Physical Activity;Increase Strength and Stamina;Understanding of Exercise Prescription Increase Physical Activity;Increase Strength and Stamina;Understanding of Exercise Prescription Increase Physical Activity;Increase Strength and Stamina;Understanding of Exercise Prescription Increase Physical Activity;Increase Strength and  Stamina;Understanding of Exercise Prescription   Comments Reviewed RPE and dyspnea scales, THR and program prescription with pt today.  Pt voiced understanding and was given a copy of goals to take home. Hether is off to a good start in rehab.  She has completed her first four days of exercise thus far.  She is now going for the 30 minutes.  We will continue to monitor her progress. Magdelene is walking at home around a track for ~1 hour with her husband. She has not gone over home exercise with EP. Rocsi is doing well in rehab. She increased her laps on the track from 7 to 15 in total. She also moved up to level 3 on the XR. She tried out the treadmill for the first time at 1.1 mph speed which about the same speed as her track. We hope to see that improve over time. Will continue to monitor. Marcene Brawn, [place], reviewed home exercise with pt today.  Pt is walking 30-45 min at a baseball field and breaking during laps. Pt is walking 5-6 days per week. Pt plans to continue walking at the baseball field for exercise.  Reviewed THR, pulse, RPE, sign and symptoms, pulse oximetery and when to call 911 or MD.  Also discussed weather considerations and indoor options.  Pt voiced understanding. Pt requested explanation of METs and assistance with balance exercise; balance printout and staff youtube video information provided.   Expected Outcomes Short: Use RPE daily to regulate intensity. Long: Follow program prescription in THR. Short: Continue to attend rehab regularly Long: Continue to follow program prescription ST: EP to go over home exercise LT: Continue to follow program prescription Short: Increase speed on treadmill Long: Continue to increase overall MET level Short: minimize breaks during walking exercise Long: continue exercising independently at home    [place] Name 10/03/21 0832 10/17/21 1656 10/27/21 1106 10/30/21 1519 11/13/21  1532     Exercise Goal Re-Evaluation   Exercise Goals Review Increase Physical Activity;Increase  Strength and Stamina;Understanding of Exercise Prescription Increase Physical Activity;Increase Strength and Stamina;Understanding of Exercise Prescription Increase Physical Activity;Increase Strength and Stamina;Understanding of Exercise Prescription Increase Physical Activity;Increase Strength and Stamina;Understanding of Exercise Prescription Increase Physical Activity;Increase Strength and Stamina;Understanding of Exercise Prescription   Comments Luwana is doing well in rehab. She increased her speed on the treadmill to 1.2 mph. She has also tolerated using 4 lb hand weights for resistance training. She could benefit from pushing it to do more laps on the track. We will continue to monitor her progress in the program. Evelin continues to do well in rehab. She did not walk at all since last review. She was able to increase to level 2 on the T5 Nustep. Her level on the XR is inconsistent and would  benefit from bringing that back up.  Oxygen is staying above 88%. Will continue to monitor. An is doing well in rehab.  She has been stretching and PT exercises in addition to what we have been doing in class.  She is walking at the baseball field 90 ft at a time. She is getting more strength back, but not feeling like her stamina is getting better.  She wants to go harder, but limited by walking.  She is looking into [employer] so that she can use some seated equipment.  We talked about picking the one that she is more likely to go to.  When knows that when she feels progress, she is more motivated to go. We also talked about using the staff videos on bad weather days. Loriis doing well in rehab. She recently improved to level 3 on the T5 machine. She also improved her overall average METs to 2 METs. She has also tolerated 4 lb hand weights for resistance training. We will continue to monitor her progress in the program. Courtnei has only attended rehab once since the last evaluation. She did improve to  level 5 on the XR machine. She has also tolerated using 4 lb hand weights for resistance training. We will continue to monitor her progress in the program.   Expected Outcomes Short: Continue to push for more laps on the track. Long: Continue to increase stamina. Short: Increase XR level back up and get back to walking Long: Continue to increase overall MET level Short: Continue to walk on off days Long: Conitnue to improve stamina Short: Continue to follow exercise prescription. Long: Conitnue to improve strength and stamina. Short: Continue to attend rehab regularly. Long: Continue to increase overall MET levels.    [place] Name 11/21/21 1120 11/29/21 1157 12/12/21 0811 12/25/21 1544 01/10/22 1154     Exercise Goal Re-Evaluation   Exercise Goals Review Increase Physical Activity;Increase Strength and Stamina;Understanding of Exercise Prescription;Able to understand and use rate of perceived exertion (RPE) scale;Knowledge and understanding of Target Heart Rate Range (THRR);Able to understand and use Dyspnea scale;Able to check pulse independently Increase Physical Activity;Increase Strength and Stamina;Understanding of Exercise Prescription Increase Physical Activity;Increase Strength and Stamina;Understanding of Exercise Prescription Increase Physical Activity;Increase Strength and Stamina;Understanding of Exercise Prescription --   Comments Reviewed home exercise with pt today.  Pt plans to walk and use staff videos at home for exercise.  She also has PT exercises.  Reviewed THR, pulse, RPE, sign and symptoms, pulse oximetery and when to call 911 or MD.  Also discussed weather considerations and indoor options.  Pt voiced understanding.  Sherol does not have a pulse oximeter at home, but plans to get one. She also wants to work on her balance. Layni is doing well in rehab. She improved back to level 4 on the XR. She also had an average overall MET level of 2.48 METs. She has tolerated using 4 lb hand weights for  resistance training as well. Felishia continues to do well in rehab. She increased her speed on the treadmill to 1.7 speed which is the highest she has achieved yet. She'd benefit from increasing to 5 lb handweights. She is also due for her post soon and we hope to see significant improvement. Will continue to monitor. Aysel last attended on 12/13/21.  She had worked up to her full time on the treadmill without rest breaks.  She is also up to level 3 on the XR.  We will continue to monitor her progress. Reeve last attended on 12/13/21.  She had worked up to her full time on the treadmill without rest breaks.  She will be looking to improve on her post as well.  We will continue to monitor her progress.   Expected Outcomes Short: Start to add in more exercise at home Long: conitnue to improve stamina Short: Try level 5 on the XR. Long: Continue to improve strength and stamina. Short: Improve on Long: Continue to increase overall MET level Short: Return to regular attendance Long: Continue to improve stamina Short: Return to regular attendance and graduate Long: Continue to improve strength and stamina    Row Name 01/19/22 1144 01/25/22 0803           Exercise Goal Re-Evaluation   Exercise Goals Review Increase Physical Activity;Increase Strength and Stamina;Understanding of Exercise Prescription Increase Physical Activity;Increase Strength and Stamina;Understanding of Exercise Prescription      Comments [name] will be graduating next week.  She improved her walk test by more than 300 ft!!  She is very pleased with her progress.  She is planning to keep exercising by going to [employer]. Kamry continues to do well, and will be graduating next week. She improved by over 40% on her post and also increased her treadmill speed to 2.0 mph.  We will continue to monitor her until she graduates from the program.      Expected Outcomes Continue to exercise independently Short: Graduate Long: Continue  independent exercise at appropriate prescription               Discharge Exercise Prescription (Final Exercise Prescription Changes):  Exercise Prescription Changes - 01/25/22 0800       Response to Exercise   Blood Pressure (Admit) 122/60    Blood Pressure (Exit) 94/58    Heart Rate (Admit) 103 bpm    Heart Rate (Exercise) 110 bpm    Heart Rate (Exit) 96 bpm    Oxygen Saturation (Admit) 98 %    Oxygen Saturation (Exercise) 97 %    Oxygen Saturation (Exit) 98 %    Rating of Perceived Exertion (Exercise) 14    Perceived Dyspnea (Exercise) 2    Symptoms SOB    Duration Continue with 30 min of aerobic exercise without signs/symptoms of physical distress.    Intensity THRR unchanged      Progression   Progression Continue to progress workloads to maintain intensity without signs/symptoms of physical distress.    Average METs 3.34      Resistance Training   Training Prescription Yes    Weight 5 lb  Reps 10-15      Interval Training   Interval Training No      Treadmill   MPH 2    Grade 0    Minutes 15    METs 2.53      REL-XR   Level 5    Minutes 15    METs 4.8      Biostep-RELP   Level 4    Minutes 15    METs 4      Home Exercise Plan   Plans to continue exercise at Home (comment)   walking; PT exercises   Frequency Add 2 additional days to program exercise sessions.    Initial Home Exercises Provided 11/21/21      Oxygen   Maintain Oxygen Saturation 88% or higher             Nutrition:  Target Goals: Understanding of nutrition guidelines, daily intake of sodium '1500mg'$ , cholesterol '200mg'$ , calories 30% from fat and 7% or less from saturated fats, daily to have 5 or more servings of fruits and vegetables.  Education: All About Nutrition: -Group instruction provided by verbal, written material, interactive activities, discussions, models, and posters to present general guidelines for heart healthy nutrition including fat, fiber, MyPlate, the role  of sodium in heart healthy nutrition, utilization of the nutrition label, and utilization of this knowledge for meal planning. Follow up email sent as well. Written material given at graduation.   Biometrics:  Pre Biometrics - 08/17/21 1716       Pre Biometrics   Height 5' 7.25" (1.708 m)    Weight 295 lb 11.2 oz (134.1 kg)    BMI (Calculated) 45.98    Single Leg Stand 2.3 seconds             Post Biometrics - 01/17/22 1145        Post  Biometrics   Height 5' 7.25" (1.708 m)    Weight 258 lb 12.8 oz (117.4 kg)    BMI (Calculated) 40.24    Single Leg Stand 2.5 seconds             Nutrition Therapy Plan and Nutrition Goals:  Nutrition Therapy & Goals - 09/11/21 1135       Nutrition Therapy   RD appointment deferred Yes   Seeing RD s/p bariatric surgery - she has her 6 month appointment soon. Will defer nutrition until 6 month bariatric surgery RD appointment.     Personal Nutrition Goals   Nutrition Goal Seeing RD s/p bariatric surgery - she has her 6 month appointment soon. Will defer nutrition until 6 month bariatric surgery RD appointment.             Nutrition Assessments:  MEDIFICTS Score Key: ?70 Need to make dietary changes  40-70 Heart Healthy Diet ? 40 Therapeutic Level Cholesterol Diet  Flowsheet Row Pulmonary Rehab from 08/17/2021 in [facility]  Picture Your Plate Total Score on Admission 64      Picture Your Plate Scores: <03 Unhealthy dietary pattern with much room for improvement. 41-50 Dietary pattern unlikely to meet recommendations for good health and room for improvement. 51-60 More healthful dietary pattern, with some room for improvement.  >60 Healthy dietary pattern, although there may be some specific behaviors that could be improved.   Nutrition Goals Re-Evaluation:  Nutrition Goals Re-Evaluation     [place] Name 10/02/21 1139 10/27/21 1117 11/21/21 1130 01/19/22 1148       Goals   Current Weight  262 lb  (118.8 kg) -- -- --    Nutrition Goal Continues to defer RD appointment. Continues to defer RD appointment. Continues to defer RD appointment. Continues to defer RD appointment.    Comment Marynell states she is working with her bariatric doctor's office for her nutritional needs. She currently defers a Counselling psychologist. Maysun is doing well with her diet.  She does feel fatigued when she eats and will talk to dietitian about this. Jerome continues to work with her bariatric dietitian already.  She is following her diet and doing well.  She continues to get a good variety. Aylinn continues to work with her Mining engineer. She is following her diet and doing well and plans to continues.   She continues to get a good variety.    Expected Outcome ST: continue meeting with bariatric diet plan LT: weigh less than 200 lbs. ST: continue meeting with bariatric diet plan LT: weigh less than 200 lbs. ST: continue meeting with bariatric diet plan LT: weigh less than 200 lbs. Continue with bariatic diet             Nutrition Goals Discharge (Final Nutrition Goals Re-Evaluation):  Nutrition Goals Re-Evaluation - 01/19/22 1148       Goals   Nutrition Goal Continues to defer RD appointment.    Comment Christena continues to work with her Mining engineer. She is following her diet and doing well and plans to continues.   She continues to get a good variety.    Expected Outcome Continue with bariatic diet             Psychosocial: Target Goals: Acknowledge presence or absence of significant depression and/or stress, maximize coping skills, provide positive support system. Participant is able to verbalize types and ability to use techniques and skills needed for reducing stress and depression.   Education: Stress, Anxiety, and Depression - Group verbal and visual presentation to define topics covered.  Reviews how body is impacted by stress, anxiety, and depression.  Also discusses healthy ways to reduce  stress and to treat/manage anxiety and depression.  Written material given at graduation.   Education: Sleep Hygiene -Provides group verbal and written instruction about how sleep can affect your health.  Define sleep hygiene, discuss sleep cycles and impact of sleep habits. Review good sleep hygiene tips.    Initial Review & Psychosocial Screening:  Initial Psych Review & Screening - 10/02/21 1133       Initial Review   Current issues with Current Depression;Current Psychotropic Meds;History of Depression;Current Stress Concerns;Current Sleep Concerns             Quality of Life Scores:  Scores of 19 and below usually indicate a poorer quality of life in these areas.  A difference of  2-3 points is a clinically meaningful difference.  A difference of 2-3 points in the total score of the Quality of Life Index has been associated with significant improvement in overall quality of life, self-image, physical symptoms, and general health in studies assessing change in quality of life.  PHQ-9: Review Flowsheet  More data exists      11/21/2021 11/13/2021 10/27/2021 10/13/2021 09/11/2021  Depression screen PHQ 2/9  Decreased Interest 2 1 1 1 1   Down, Depressed, Hopeless 2 1 1 1 1   PHQ - 2 Score 4 2 2 2 2   Altered sleeping 3 3 2 3 3   Tired, decreased energy 3 3 1 2 2   Change in appetite 0 0 0 0  0  Feeling bad or failure about yourself  3 3 3 3 2   Trouble concentrating 2 2 3 3 2   Moving slowly or fidgety/restless 0 2 2 3 1   Suicidal thoughts 0 0 0 - 0  PHQ-9 Score 15 15 13 16 12   Difficult doing work/chores Very difficult Very difficult Somewhat difficult Somewhat difficult Somewhat difficult   Interpretation of Total Score  Total Score Depression Severity:  1-4 = Minimal depression, 5-9 = Mild depression, 10-14 = Moderate depression, 15-19 = Moderately severe depression, 20-27 = Severe depression   Psychosocial Evaluation and Intervention:  Psychosocial Evaluation - 08/14/21 1129        Psychosocial Evaluation & Interventions   Interventions Stress management education;Encouraged to exercise with the program and follow exercise prescription    Comments She is out of work with a sever autistic son. She is generally negative and is trying to be more positive. She talks to a therapist and takes medsication to help her cope better. She is determined to exercise, get weight off and be more positive.    Expected Outcomes Short: Start [facility] to help with mood. Long: Maintain a healthy mental state.    Continue Psychosocial Services  Follow up required by staff             Psychosocial Re-Evaluation:  Psychosocial Re-Evaluation     [place] Name 09/11/21 1137 10/02/21 1133 10/27/21 1112 11/21/21 1121 01/19/22 1146     Psychosocial Re-Evaluation   Current issues with Current Depression;Current Anxiety/Panic;Current Sleep Concerns;Current Psychotropic Meds Current Depression;Current Anxiety/Panic;Current Sleep Concerns;Current Psychotropic Meds;Current Stress Concerns Current Depression;Current Anxiety/Panic;Current Sleep Concerns;Current Psychotropic Meds;Current Stress Concerns Current Depression;Current Anxiety/Panic;Current Sleep Concerns;Current Psychotropic Meds;Current Stress Concerns Current Depression;Current Anxiety/Panic;Current Sleep Concerns;Current Psychotropic Meds;Current Stress Concerns   Comments Yuleni reports doing well with her mental health and is now on medication as well as seeing a therapist 1x/week. She relies on her husband, best friend, therapist, and doctors for support. She reports walking, driving in nice weather, and staying busy to reduce stressors. She is not sleeping well right now due to stomach pain - she is working with her MD on this. Darrah states that therapy has been good and she continues to go. She is compliant with her medication and states they are effective. Sleep has improved, but she desires for continued improvement. She feels the biggest  barrier to sleeping well is caring for her special-needs son to monitor his behavior. Her husband is supportive and helps in caring for the son and other duties. Stress: her daughter's car is broke-down now and they is helping her with transportation of her to work and her child to school. Managing stress with exercise and talking with her husband. Kenlea is doing well in rehab.  Her PHQ has improve 3 points over the last two weeks.  She is feeling better and moving more.  She is still struggling with her sleep.  When she eats she gets very fatigued and has to rest.  She continues to care for her son who is ready to go on vacation.  He has been having mostly good days, but will sneak into kitchen to get into things. Kaymarie is doing well in rehab. Her PHQ score has shifted around, but score remains the same.  She has her usual issues with her family and son getting out the door.  Her son has a harder time with things in morning.  Her finances got mixed up this month and they are short.  Her  balance seems to be getting worse which she finds frustrating.  She enjoys exercise and knows that it makes her feel better.  She knows that she needs to focus and be displined. Krissi has enjoyed rehab.  She is pleased with her progress and has really benefited from the routine of coming and doing her exercise.  She is able to do more and feels better overall.  She is also feeling better equipped and coping better with her stresses at home. She is sleeping well.   Expected Outcomes ST: continue to do stress reducing activites  LT: maintain positive attitude ST: continue to attend rehab, exercise at home, and attend therapy. LT: continue to maintain positive attitude and utilize exercise for stress management Short: Continue to exercise for mental outlet and boost Long: Continue to focus on positive Short: Make time to exercise and take care of herself LOng: continue to focus on the successes Make time to go exercise for herself    Interventions Stress management education;Encouraged to attend Cardiac Rehabilitation for the exercise Stress management education;Encouraged to attend Pulmonary Rehabilitation for the exercise Stress management education;Encouraged to attend Pulmonary Rehabilitation for the exercise Stress management education;Encouraged to attend Pulmonary Rehabilitation for the exercise Encouraged to attend Pulmonary Rehabilitation for the exercise   Continue Psychosocial Services  Follow up required by staff Follow up required by staff Follow up required by staff Follow up required by staff --     Initial Review   Source of Stress Concerns -- Chronic Illness;Occupation;Family -- Chronic Illness;Occupation;Family --   Comments -- She continues to care for her son and help her other children. She seems positive with her current situation and reaches out for help as needed. She is taking her medication and seeing her therapist. -- -- --            Psychosocial Discharge (Final Psychosocial Re-Evaluation):  Psychosocial Re-Evaluation - 01/19/22 1146       Psychosocial Re-Evaluation   Current issues with Current Depression;Current Anxiety/Panic;Current Sleep Concerns;Current Psychotropic Meds;Current Stress Concerns    Comments Ermine has enjoyed rehab.  She is pleased with her progress and has really benefited from the routine of coming and doing her exercise.  She is able to do more and feels better overall.  She is also feeling better equipped and coping better with her stresses at home. She is sleeping well.    Expected Outcomes Make time to go exercise for herself    Interventions Encouraged to attend Pulmonary Rehabilitation for the exercise             Education: Education Goals: Education classes will be provided on a weekly basis, covering required topics. Participant will state understanding/return demonstration of topics presented.  Learning Barriers/Preferences:  Learning  Barriers/Preferences - 08/14/21 1123       Learning Barriers/Preferences   Learning Barriers Sight    Learning Preferences Skilled Demonstration;Video             General Pulmonary Education Topics:  Infection Prevention: - Provides verbal and written material to individual with discussion of infection control including proper hand washing and proper equipment cleaning during exercise session. Flowsheet Row Pulmonary Rehab from 08/14/2021 in [facility] Cardiac and Pulmonary Rehab  Date 08/14/21  Educator [facility]  Instruction Review Code 1- Verbalizes Understanding       Falls Prevention: - Provides verbal and written material to individual with discussion of falls prevention and safety. Flowsheet Row Pulmonary Rehab from 08/14/2021 in [facility] Cardiac and Pulmonary Rehab  Date 08/14/21  Educator [facility]  Instruction Review Code 1- Verbalizes Understanding       Chronic Lung Disease Review: - Group verbal instruction with posters, models, PowerPoint presentations and videos,  to review new updates, new respiratory medications, new advancements in procedures and treatments. Providing information on websites and "800" numbers for continued self-education. Includes information about supplement oxygen, available portable oxygen systems, continuous and intermittent flow rates, oxygen safety, concentrators, and Medicare reimbursement for oxygen. Explanation of Pulmonary Drugs, including class, frequency, complications, importance of spacers, rinsing mouth after steroid MDI's, and proper cleaning methods for nebulizers. Review of basic lung anatomy and physiology related to function, structure, and complications of lung disease. Review of risk factors. Discussion about methods for diagnosing sleep apnea and types of masks and machines for OSA. Includes a review of the use of types of environmental controls: home humidity, furnaces, filters, dust mite/pet prevention, HEPA vacuums. Discussion about weather  changes, air quality and the benefits of nasal washing. Instruction on Warning signs, infection symptoms, calling MD promptly, preventive modes, and value of vaccinations. Review of effective airway clearance, coughing and/or vibration techniques. Emphasizing that all should Create an Action Plan. Written material given at graduation. Flowsheet Row Pulmonary Rehab from 12/06/2021 in [facility] Cardiac and Pulmonary Rehab  Education need identified 08/17/21       AED/CPR: - Group verbal and written instruction with the use of models to demonstrate the basic use of the AED with the basic ABC's of resuscitation.    Anatomy and Cardiac Procedures: - Group verbal and visual presentation and models provide information about basic cardiac anatomy and function. Reviews the testing methods done to diagnose heart disease and the outcomes of the test results. Describes the treatment choices: Medical Management, Angioplasty, or Coronary Bypass Surgery for treating various heart conditions including Myocardial Infarction, Angina, Valve Disease, and Cardiac Arrhythmias.  Written material given at graduation.   Medication Safety: - Group verbal and visual instruction to review commonly prescribed medications for heart and lung disease. Reviews the medication, class of the drug, and side effects. Includes the steps to properly store meds and maintain the prescription regimen.  Written material given at graduation.   Other: -Provides group and verbal instruction on various topics (see comments)   Knowledge Questionnaire Score:  Knowledge Questionnaire Score - 08/17/21 1702       Knowledge Questionnaire Score   Pre Score 16/18: Exercise, Oxygen              Core Components/Risk Factors/Patient Goals at Admission:  Personal Goals and Risk Factors at Admission - 08/17/21 1720       Core Components/Risk Factors/Patient Goals on Admission    Weight Management Yes;Obesity;Weight Loss    Intervention  Weight Management: Develop a combined nutrition and exercise program designed to reach desired caloric intake, while maintaining appropriate intake of nutrient and fiber, sodium and fats, and appropriate energy expenditure required for the weight goal.;Weight Management: Provide education and appropriate resources to help participant work on and attain dietary goals.;Weight Management/Obesity: Establish reasonable short term and long term weight goals.;Obesity: Provide education and appropriate resources to help participant work on and attain dietary goals.    Admit Weight 295 lb (133.8 kg)    Goal Weight: Short Term 290 lb (131.5 kg)    Goal Weight: Long Term 240 lb (108.9 kg)    Expected Outcomes Short Term: Continue to assess and modify interventions until short term weight is achieved;Long Term: Adherence to nutrition and physical activity/exercise program  aimed toward attainment of established weight goal;Weight Loss: Understanding of general recommendations for a balanced deficit meal plan, which promotes 1-2 lb weight loss per week and includes a negative energy balance of [phone] kcal/d;Understanding recommendations for meals to include 15-35% energy as protein, 25-35% energy from fat, 35-60% energy from carbohydrates, less than $RemoveB'200mg'UtytbMiM$  of dietary cholesterol, 20-35 gm of total fiber daily;Understanding of distribution of calorie intake throughout the day with the consumption of 4-5 meals/snacks    Improve shortness of breath with ADL's Yes    Intervention Provide education, individualized exercise plan and daily activity instruction to help decrease symptoms of SOB with activities of daily living.    Expected Outcomes Short Term: Improve cardiorespiratory fitness to achieve a reduction of symptoms when performing ADLs;Long Term: Be able to perform more ADLs without symptoms or delay the onset of symptoms    Diabetes Yes    Intervention Provide education about signs/symptoms and action to take for  hypo/hyperglycemia.;Provide education about proper nutrition, including hydration, and aerobic/resistive exercise prescription along with prescribed medications to achieve blood glucose in normal ranges: Fasting glucose 65-99 mg/dL    Expected Outcomes Short Term: Participant verbalizes understanding of the signs/symptoms and immediate care of hyper/hypoglycemia, proper foot care and importance of medication, aerobic/resistive exercise and nutrition plan for blood glucose control.;Long Term: Attainment of HbA1C < 7%.    Hypertension Yes    Intervention Monitor prescription use compliance.;Provide education on lifestyle modifcations including regular physical activity/exercise, weight management, moderate sodium restriction and increased consumption of fresh fruit, vegetables, and low fat dairy, alcohol moderation, and smoking cessation.    Expected Outcomes Short Term: Continued assessment and intervention until BP is < 140/32mm HG in hypertensive participants. < 130/44mm HG in hypertensive participants with diabetes, heart failure or chronic kidney disease.;Long Term: Maintenance of blood pressure at goal levels.    Lipids Yes    Intervention Provide education and support for participant on nutrition & aerobic/resistive exercise along with prescribed medications to achieve LDL '70mg'$ , HDL >$Remo'40mg'uIxqg$ .    Expected Outcomes Short Term: Participant states understanding of desired cholesterol values and is compliant with medications prescribed. Participant is following exercise prescription and nutrition guidelines.;Long Term: Cholesterol controlled with medications as prescribed, with individualized exercise RX and with personalized nutrition plan. Value goals: LDL < $Rem'70mg'KOXM$ , HDL > 40 mg.             Education:Diabetes - Individual verbal and written instruction to review signs/symptoms of diabetes, desired ranges of glucose level fasting, after meals and with exercise. Acknowledge that pre and post exercise  glucose checks will be done for 3 sessions at entry of program. Flowsheet Row Pulmonary Rehab from 08/14/2021 in [facility] Cardiac and Pulmonary Rehab  Date 08/14/21  Educator [facility]  Instruction Review Code 1- Verbalizes Understanding       Know Your Numbers and Heart Failure: - Group verbal and visual instruction to discuss disease risk factors for cardiac and pulmonary disease and treatment options.  Reviews associated critical values for Overweight/Obesity, Hypertension, Cholesterol, and Diabetes.  Discusses basics of heart failure: signs/symptoms and treatments.  Introduces Heart Failure Zone chart for action plan for heart failure.  Written material given at graduation.   Core Components/Risk Factors/Patient Goals Review:   Goals and Risk Factor Review     Row Name 09/11/21 1141 10/02/21 1122 10/27/21 1118 11/21/21 1127 01/19/22 1149     Core Components/Risk Factors/Patient Goals Review   Personal Goals Review Weight Management/Obesity;Diabetes Weight Management/Obesity;Hypertension;Diabetes;Improve shortness of breath with ADL's Weight  Management/Obesity;Hypertension;Diabetes;Improve shortness of breath with ADL's;Increase knowledge of respiratory medications and ability to use respiratory devices properly. 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 She reports having stomach pain which is making it hard to tolerate food and her medications; she is unable to take her gastric bypass vitamins - her doctor suggested taking 2 MVI per day in the interim. her doctor is going to order a CT to investigate causes. She continues to check her BG at home in the monring - running in the 90s; her last A1C is now 7%, she is due for a new one soon. She is dealing with muscle soreness from moving her body  more due to rehab and exercise at home - she takes tylenol for this. Charlette continues to lose weight since the bypass surgery. She is currently at 262 lb per her home scales. Her goal is to lose below 200 lbs. She states that weight loss is sporadic, losing larger amounts then plateauing. She understands that this is related to the surgery. She is having an upper GI on 6/6 to determine if she is having a food intolerance. Diabetes: Darothy says her sugars have been good but they are creeping up and has an appointment scheduled this week. She has been drinking orange juice and thinks that is related, but has been diluting the orange juice with water to reduce the sugar level and having crystal light. She isn't currently on any diabetic medication and her average BG level is 90-100 fasting. She will discuss this further with her physician to determine their goals for her. ADLs: Pt says it's easier to stand for longer periods of time with less sitting breaks to wash dishes. Marionette has been walking to rehab and finds it easier to get up from the couch. However, she has some instability/balance concerns and was encouraged to use assistance to stabilize self. She states her husband is mindful and supportive of her during her instability. She will be discussing this concern with her physician on Thursday, 6/7. Hypertension: Hillary states her blood pressure has remained stable. Jaleah is doing well in rehab. She continues to work on weight loss is down to 258.  She is doing well with her sugar control and has noticed that orange juice was driving it up, so she cut back. She is doing well with her breathing and is trying to do more at home.  She still gets winded walking but better on seated on equipment.  Her pressures have continued to be good. Roberta is doing well in rehab. She continues to lose weight, but wants to maintain her muscular strength.  Her pressures are doing well and she continues to check them at home.  She is doing  well with her medications.  She is off her metformin as her A1c has gotten down to 6.1!!  She is getting better with her breathing. Royce is doing well in rehab.  She lost weight while she was sick, but has gained it back some and now plateued.  We talked about continuing to follow her diet and weight will come down again.  Her pressures are doing well and she continues to keep a close eye on them.  Her sugars are steady.  Her breathing has gotten a lot better and she is able to do more before getting SOB.   Expected Outcomes ST: get CT  and f/u with doctor for stomach pain LT: continue to manage risk factors ST: continue to monitor blood sugar and talk with PCP about blood sugars and instability;  LT: continue to manage lifestyle risk factors Short: Continue to build up breathing Long: COnitnue to work on weight loss Short: Continue to work on exercise and strength Long: Continue to work on weight loss Continue to monitor risk factors            Core Components/Risk Factors/Patient Goals at Discharge (Final Review):   Goals and Risk Factor Review - 01/19/22 1149       Core Components/Risk Factors/Patient Goals Review   Personal Goals Review Weight Management/Obesity;Hypertension;Diabetes;Improve shortness of breath with ADL's    Review Haani is doing well in rehab.  She lost weight while she was sick, but has gained it back some and now plateued.  We talked about continuing to follow her diet and weight will come down again.  Her pressures are doing well and she continues to keep a close eye on them.  Her sugars are steady.  Her breathing has gotten a lot better and she is able to do more before getting SOB.    Expected Outcomes Continue to monitor risk factors             ITP Comments:  ITP Comments     Row Name 08/14/21 1127 08/17/21 1700 08/21/21 1015 09/13/21 0922 10/11/21 1355   ITP Comments Virtual Visit completed. Patient informed on EP and RD appointment and 6 Minute walk test.  Patient also informed of patient health questionnaires on My Chart. Patient Verbalizes understanding. Visit diagnosis can be found in [facility] 04/27/[facility]22. Completed 6MWT and gym orientation. Initial ITP created and sent for review to Dr. Ottie Glazier,  Medical Director. First full day of exercise!  Patient was oriented to gym and equipment including functions, settings, policies, and procedures.  Patient's individual exercise prescription and treatment plan were reviewed.  All starting workloads were established based on the results of the 6 minute walk test done at initial orientation visit.  The plan for exercise progression was also introduced and progression will be customized based on patient's performance and goals. 30 Day review completed. Medical Director ITP review done, changes made as directed, and signed approval by Medical Director. 30 Day review completed. Medical Director ITP review done, changes made as directed, and signed approval by Medical Director.    Row Name 11/08/21 1034 12/06/21 0959 12/25/21 1544 12/29/21 0757 01/03/22 1351   ITP Comments 30 Day review completed. Medical Director ITP review done, changes made as directed, and signed approval by Medical Director. 30 Day review completed. Medical Director ITP review done, changes made as directed, and signed approval by Medical Director. Pt has been out sick since 8/16.  She hopes to be able to return to rehab this week. Patient has called out for today. She has not attended since 12/13/2021. Patients attendance has been sporadic due to not feeling well. Will follow up with patient. Johnnye has been out since the last Dr. Sabra Heck day. Goals will be reviewed when patient returns and has been attending regularly. 30 Day review completed. Medical Director ITP review done, changes made as directed, and signed approval by Medical Director.    [place] Name 01/31/22 0802           ITP Comments 30 Day review completed. Medical Director ITP review done,  changes made as directed, and signed approval by Medical Director.  Comments:

## 2022-02-01 ENCOUNTER — Ambulatory Visit (INDEPENDENT_AMBULATORY_CARE_PROVIDER_SITE_OTHER): Payer: Medicare Other | Admitting: Psychology

## 2022-02-01 DIAGNOSIS — F339 Major depressive disorder, recurrent, unspecified: Secondary | ICD-10-CM

## 2022-02-01 NOTE — Progress Notes (Signed)
[place] Counselor/Therapist Progress Note  Patient ID: Brandy [name], MRN: [id number]    Date: 02/01/22  Time Spent: 12:04 pm - 1:00 pm: 56 Minutes  Treatment Type: Individual Therapy.  Reported Symptoms: Anxiety   Mental Status Exam: Appearance:  Casual     Behavior: Appropriate  Motor: Normal  Speech/Language:  Clear and Coherent and Normal Rate  Affect: Appropriate  Mood: normal  Thought process: normal  Thought content:   WNL  Sensory/Perceptual disturbances:   WNL  Orientation: oriented to person, place, time/date, and situation  Attention: Good  Concentration: Good  Memory: WNL  Fund of knowledge:  Good  Insight:   Good  Judgment:  Good  Impulse Control: Good   Risk Assessment: Danger to Self:  No Self-injurious Behavior: No Danger to Others: No Duty to Warn:no Physical Aggression / Violence:No  Access to Firearms a concern: No  Gang Involvement:No   Subjective:   Brandy [name] participated car, via video, and consented to treatment. Therapist participated from home office. We met online due to [place] pandemic. Brandy [name] reviewed the events of the past week. Brandy [name] noted with frustration with a recent interaction with her daughter who has a difficult time managing her mood and communicating respectfully when stressed. Brandy [name] noted worry and frustration regarding her daughter's lack of accountability in regards to parenting. She noted her attempts to communicate concerns and Brandy [name] receiving immediate negative feedback. We explored her feelings during the session. She noted her daughter's inability to manage her mood and difficulty to receive feedback. We continued to explore her relationship dynamics. We worked on identifying boundaries going forward. She noted positives with Brandy [name] as he has learned to employ breathing techniques and Brandy [name] discussed feeling pride and happiness. Therapist praised Brandy [name] for her effort and energy.  And provided  supportive therapy.  Brandy [name] continues to benefit from counseling and a follow-up was scheduled.   Interventions: CBT & Interpersonal  Interventions: Cognitive Behavioral Therapy and Interpersonal   Diagnosis: Episode of recurrent major depressive disorder, unspecified depression episode severity (Brandy [name])  Treatment Plan:  [name], forthcoming, self-aware, and motivated for change.   Support System: Friend Brandy [name]) and Family.   Client Treatment Preferences Outpatient Therapy.  Client Statement of Needs Brandy [name]'s goals for treatment managing her mood and symptoms, processing past events, focus on self-care, set boundaries for self and others, decrease negative self-talk, decrease rumination, process relationship stressors, and improve health.   Treatment Level Weekly  Symptoms  Depressive symptoms: Decreased interest, feeling down, poor sleep, lethargy, psycho-motor retardation, feeling bad about self.  (Status: maintained)  Anxiety: Difficulty managing worry, worrying about different things, restlessness, irritability, afraid something awful might happen  (Status: maintained)  Goals:  Brandy [name] experiences symptoms of depression and anxiety.   Target Date: 2022-06-08 Frequency: Weekly  Progress: 10 Modality: individual    Therapist will provide referrals for additional resources as appropriate.  Therapist will provide psycho-education regarding Brandy [name]'s diagnosis and corresponding treatment approaches and interventions. Licensed Clinical Social Worker, [place], LCSW will support the patient's ability to achieve the goals identified. will employ CBT, BA, Problem-solving, Solution Focused, Mindfulness,  coping skills, & other evidenced-based practices will be used to promote progress towards healthy functioning to help manage decrease symptoms associated with her diagnosis.   Reduce overall level, frequency, and intensity of the feelings of  depression and anxiety as evidenced by decreased overall symptoms of depression and anxiety from 6 to 7 days/week to 0 to 1 days/week  per client report for at least 3 consecutive months. Verbally express understanding of the relationship between feelings of depression, anxiety and their impact on thinking patterns and behaviors. Verbalize an understanding of the role that distorted thinking plays in creating fears, excessive worry, and ruminations.  Brandy [name] participated in the creation of the treatment plan)    [name], LCSW

## 2022-02-02 ENCOUNTER — Ambulatory Visit: Payer: BC Managed Care – PPO

## 2022-02-05 ENCOUNTER — Ambulatory Visit: Payer: BC Managed Care – PPO | Admitting: *Deleted

## 2022-02-07 ENCOUNTER — Ambulatory Visit: Payer: BC Managed Care – PPO | Admitting: *Deleted

## 2022-02-07 ENCOUNTER — Encounter: Payer: Self-pay | Admitting: *Deleted

## 2022-02-07 DIAGNOSIS — R0600 Dyspnea, unspecified: Secondary | ICD-10-CM

## 2022-02-07 NOTE — Progress Notes (Signed)
Discharge Summary: Brandy [name] (DOB 12/22/1965)    [name] graduated early today from  rehab with 33 sessions completed.  Details of the patient's exercise prescription and what She needs to do in order to continue the prescription and progress were discussed with patient.  Patient was given a copy of prescription and goals.  Patient verbalized understanding.  Sheran plans to continue to exercise by walking.   [name] Name 08/17/21 1715 01/17/22 1141       6 Minute Walk   Phase Initial Discharge    Distance 765 feet 1090 feet    Distance % Change -- 42.5 %    Distance Feet Change -- 325 ft    Walk Time 6 minutes 6 minutes    # of Rest Breaks 0 0    MPH 1.44 2.06    METS 2.17 2.81    RPE 13 13    Perceived Dyspnea  2 2    VO2 Peak 7.6 9.82    Symptoms Yes (comment) Yes (comment)    Comments SOB SOB    Resting HR 65 bpm 85 bpm    Resting BP 140/70 134/72    Resting Oxygen Saturation  96 % 98 %    Exercise Oxygen Saturation  during 6 min walk 96 % 88 %    Max Ex. HR 140 bpm 130 bpm    Max Ex. BP 154/78 138/80    2 Minute Post BP 138/78 126/78      Interval HR   1 Minute HR 98 114    2 Minute HR 115 122    3 Minute HR 129 130    4 Minute HR 137 127    5 Minute HR 139 122    6 Minute HR 140 121    2 Minute Post HR 75 99    Interval Heart Rate? Yes Yes      Interval Oxygen   Interval Oxygen? Yes Yes    Baseline Oxygen Saturation % 96 % 98 %    1 Minute Oxygen Saturation % 97 % 97 %    1 Minute Liters of Oxygen 0 L  RA 0 L    2 Minute Oxygen Saturation % 99 % 97 %    2 Minute Liters of Oxygen 0 L 0 L    3 Minute Oxygen Saturation % 98 % 96 %    3 Minute Liters of Oxygen 0 L 0 L    4 Minute Oxygen Saturation % 98 % 91 %    4 Minute Liters of Oxygen 0 L 0 L    5 Minute Oxygen Saturation % 99 % 93 %    5 Minute Liters of Oxygen 0 L 0 L    6 Minute Oxygen Saturation % 98 % 88 %    6 Minute Liters of Oxygen 0 L 0 L    2 Minute Post Oxygen Saturation % 97 % 99 %     2 Minute Post Liters of Oxygen 0 L 0 L

## 2022-02-07 NOTE — Progress Notes (Signed)
Pulmonary Individual Treatment Plan  Patient Details  Name: SELENNE COGGIN MRN: [id number] Date of Birth: 1965-09-21 Referring Provider:   Flowsheet Row Pulmonary Rehab from 08/17/2021 in [facility] Cardiac and Pulmonary Rehab  Referring Provider Ida Rogue MD       Initial Encounter Date:  Flowsheet Row Pulmonary Rehab from 08/17/2021 in [facility] Cardiac and Pulmonary Rehab  Date 08/17/21       Visit Diagnosis: Dyspnea, unspecified type  Patient's Home Medications on Admission:  Current Outpatient Medications:    acetaminophen (TYLENOL) 650 MG CR tablet, Take 1,300 mg by mouth every 8 (eight) hours as needed for pain., Disp: , Rfl:    atorvastatin (LIPITOR) 10 MG tablet, TAKE 1 TABLET BY MOUTH EVERY DAY FOR CHOLESTEROL, Disp: 90 tablet, Rfl: 2   cetirizine (ZYRTEC) 10 MG tablet, TAKE 1 TABLET (10 MG TOTAL) BY MOUTH DAILY. FOR ALLERGIES, Disp: 90 tablet, Rfl: 1   citalopram (CELEXA) 40 MG tablet, Take 40 mg by mouth daily., Disp: , Rfl:    DULoxetine (CYMBALTA) 30 MG capsule, Take 30 mg by mouth daily. Take with 60 mg to equal 90 mg daily, Disp: , Rfl:    DULoxetine (CYMBALTA) 60 MG capsule, Take 60 mg by mouth daily. Take with 30 mg to equal 90 mg daily, Disp: , Rfl:    folic acid (FOLVITE) 1 MG tablet, Take 1 mg by mouth daily., Disp: , Rfl:    gabapentin (NEURONTIN) 300 MG capsule, TAKE 1 CAPSULE BY MOUTH 3 TIMES A DAY AS NEEDED FOR BACK PAIN, Disp: 270 capsule, Rfl: 3   lamoTRIgine (LAMICTAL) 100 MG tablet, Take 100 mg by mouth every evening., Disp: , Rfl:    lamoTRIgine (LAMICTAL) 200 MG tablet, Take 200 mg by mouth in the morning., Disp: , Rfl:    lisinopril (ZESTRIL) 30 MG tablet, TAKE 1 TABLET (30 MG TOTAL) BY MOUTH DAILY. FOR BLOOD PRESSURE, Disp: 90 tablet, Rfl: 2   metroNIDAZOLE (METROCREAM) 0.75 % cream, Apply 1 application topically daily as needed (rosacea)., Disp: , Rfl:    Multiple Minerals-Vitamins (CALCIUM-MAGNESIUM-ZINC-D3) TABS, Take 2 tablets by mouth daily., Disp: ,  Rfl:    ondansetron (ZOFRAN-ODT) 4 MG disintegrating tablet, Dissolve 1 tablet (4 mg total) by mouth every 6 (six) hours as needed for nausea or vomiting., Disp: 20 tablet, Rfl: 0   pantoprazole (PROTONIX) 40 MG tablet, Take 1 tablet (40 mg total) by mouth daily., Disp: 90 tablet, Rfl: 0   sucralfate (CARAFATE) 1 g tablet, Take by mouth., Disp: , Rfl:    traZODone (DESYREL) 50 MG tablet, Take 50 mg by mouth at bedtime as needed for sleep., Disp: , Rfl:    Vitamin D, Ergocalciferol, (DRISDOL) 1.25 MG (50000 UNIT) CAPS capsule, TAKE 1 CAPSULE BY MOUTH ONCE WEEKLY FOR VITAMIN D., Disp: 12 capsule, Rfl: 1  Past Medical History: Past Medical History:  Diagnosis Date   Anxiety    Arthritis    Back pain    Brain fog    CHF (congestive heart failure) [place]    pt not aware of this   Depression    Diabetes mellitus without complication (HCC)    Difficulty walking    Dysmetabolic syndrome X    Fatigue    Fibromyalgia    neuropathy   Hypertension    Hypertriglyceridemia    Joint pain    Leg weakness    Lower extremity edema    Muscle atrophy    Neuropathy    Obesity    Osteoarthritis  PONV (postoperative nausea and vomiting)    Poor memory    Post-menopausal bleeding    Prediabetes    Rosacea    Sleep apnea    not on cpap yet   Tetanus vaccine causing adverse effect in therapeutic use    Vitamin D deficiency     Tobacco Use: Social History   Tobacco Use  Smoking Status Never  Smokeless Tobacco Never    Labs: Review Flowsheet  More data exists      Latest Ref Rng & Units 06/01/2020 10/04/2020 03/20/2021 06/20/2021 11/09/2021  Labs for ITP Cardiac and Pulmonary Rehab  Cholestrol 0 - 200 mg/dL 137  - - 166  -  LDL (calc) 0 - 99 mg/dL 49  - - 92  -  HDL-C >39.00 mg/dL 57.30  - - 40.10  -  Trlycerides 0.0 - 149.0 mg/dL 152.0  - - 169.0  -  Hemoglobin A1c 4.0 - 5.6 % 7.0  8.6  7.9  7.0  6.1      Pulmonary Assessment Scores:  Pulmonary Assessment Scores     Row Name  08/17/21 1703         ADL UCSD   ADL Phase Entry     SOB Score total 85     Rest 0     Walk 3     Stairs 5     Bath 3     Dress 2     Shop 5       CAT Score   CAT Score 13       mMRC Score   mMRC Score 2              UCSD: Self-administered rating of dyspnea associated with activities of daily living (ADLs) 6-point scale (0 = "not at all" to 5 = "maximal or unable to do because of breathlessness")  Scoring Scores range from 0 to 120.  Minimally important difference is 5 units  CAT: CAT can identify the health impairment of COPD patients and is better correlated with disease progression.  CAT has a scoring range of zero to 40. The CAT score is classified into four groups of low (less than 10), medium (10 - 20), high (21-30) and very high (31-40) based on the impact level of disease on health status. A CAT score over 10 suggests significant symptoms.  A worsening CAT score could be explained by an exacerbation, poor medication adherence, poor inhaler technique, or progression of COPD or comorbid conditions.  CAT MCID is 2 points  mMRC: mMRC (Modified Medical Research Council) Dyspnea Scale is used to assess the degree of baseline functional disability in patients of respiratory disease due to dyspnea. No minimal important difference is established. A decrease in score of 1 point or greater is considered a positive change.   Pulmonary Function Assessment:  Pulmonary Function Assessment - 08/14/21 1123       Breath   Shortness of Breath Limiting activity;Yes             Exercise Target Goals: Exercise Program Goal: Individual exercise prescription set using results from initial 6 min walk test and THRR while considering  patient's activity barriers and safety.   Exercise Prescription Goal: Initial exercise prescription builds to 30-45 minutes a day of aerobic activity, 2-3 days per week.  Home exercise guidelines will be given to patient during program as part of  exercise prescription that the participant will acknowledge.  Education: Aerobic Exercise: - Group verbal and visual presentation  on the components of exercise prescription. Introduces F.I.T.T principle from ACSM for exercise prescriptions.  Reviews F.I.T.T. principles of aerobic exercise including progression. Written material given at graduation. Flowsheet Row Pulmonary Rehab from 12/06/2021 in [facility] Cardiac and Pulmonary Rehab  Education need identified 08/17/21       Education: Resistance Exercise: - Group verbal and visual presentation on the components of exercise prescription. Introduces F.I.T.T principle from ACSM for exercise prescriptions  Reviews F.I.T.T. principles of resistance exercise including progression. Written material given at graduation.    Education: Exercise & Equipment Safety: - Individual verbal instruction and demonstration of equipment use and safety with use of the equipment. Flowsheet Row Pulmonary Rehab from 08/14/2021 in [facility] Cardiac and Pulmonary Rehab  Date 08/14/21  Educator [facility]  Instruction Review Code 1- Verbalizes Understanding       Education: Exercise Physiology & General Exercise Guidelines: - Group verbal and written instruction with models to review the exercise physiology of the cardiovascular system and associated critical values. Provides general exercise guidelines with specific guidelines to those with heart or lung disease.  Flowsheet Row Pulmonary Rehab from 12/06/2021 in [facility] Cardiac and Pulmonary Rehab  Education need identified 08/17/21       Education: Flexibility, Balance, Mind/Body Relaxation: - Group verbal and visual presentation with interactive activity on the components of exercise prescription. Introduces F.I.T.T principle from ACSM for exercise prescriptions. Reviews F.I.T.T. principles of flexibility and balance exercise training including progression. Also discusses the mind body connection.  Reviews various relaxation  techniques to help reduce and manage stress (i.e. Deep breathing, progressive muscle relaxation, and visualization). Balance handout provided to take home. Written material given at graduation.   Activity Barriers & Risk Stratification:  Activity Barriers & Cardiac Risk Stratification - 08/17/21 1717       Activity Barriers & Cardiac Risk Stratification   Activity Barriers Deconditioning;Muscular Weakness;Shortness of Breath;Fibromyalgia;Joint Problems             6 Minute Walk:  6 Minute Walk     Row Name 08/17/21 1715 01/17/22 1141       6 Minute Walk   Phase Initial Discharge    Distance 765 feet 1090 feet    Distance % Change -- 42.5 %    Distance Feet Change -- 325 ft    Walk Time 6 minutes 6 minutes    # of Rest Breaks 0 0    MPH 1.44 2.06    METS 2.17 2.81    RPE 13 13    Perceived Dyspnea  2 2    VO2 Peak 7.6 9.82    Symptoms Yes (comment) Yes (comment)    Comments SOB SOB    Resting HR 65 bpm 85 bpm    Resting BP 140/70 134/72    Resting Oxygen Saturation  96 % 98 %    Exercise Oxygen Saturation  during 6 min walk 96 % 88 %    Max Ex. HR 140 bpm 130 bpm    Max Ex. BP 154/78 138/80    2 Minute Post BP 138/78 126/78      Interval HR   1 Minute HR 98 114    2 Minute HR 115 122    3 Minute HR 129 130    4 Minute HR 137 127    5 Minute HR 139 122    6 Minute HR 140 121    2 Minute Post HR 75 99    Interval Heart Rate? Yes Yes  Interval Oxygen   Interval Oxygen? Yes Yes    Baseline Oxygen Saturation % 96 % 98 %    1 Minute Oxygen Saturation % 97 % 97 %    1 Minute Liters of Oxygen 0 L  RA 0 L    2 Minute Oxygen Saturation % 99 % 97 %    2 Minute Liters of Oxygen 0 L 0 L    3 Minute Oxygen Saturation % 98 % 96 %    3 Minute Liters of Oxygen 0 L 0 L    4 Minute Oxygen Saturation % 98 % 91 %    4 Minute Liters of Oxygen 0 L 0 L    5 Minute Oxygen Saturation % 99 % 93 %    5 Minute Liters of Oxygen 0 L 0 L    6 Minute Oxygen Saturation % 98 % 88 %     6 Minute Liters of Oxygen 0 L 0 L    2 Minute Post Oxygen Saturation % 97 % 99 %    2 Minute Post Liters of Oxygen 0 L 0 L            Oxygen Initial Assessment:  Oxygen Initial Assessment - 08/17/21 1702       Home Oxygen   Home Oxygen Device None    Sleep Oxygen Prescription None    Home Exercise Oxygen Prescription None    Home Resting Oxygen Prescription None    Compliance with Home Oxygen Use Yes      Initial 6 min Walk   Oxygen Used None      Program Oxygen Prescription   Program Oxygen Prescription None      Intervention   Short Term Goals To learn and understand importance of monitoring SPO2 with pulse oximeter and demonstrate accurate use of the pulse oximeter.;To learn and understand importance of maintaining oxygen saturations>88%;To learn and demonstrate proper pursed lip breathing techniques or other breathing techniques.     Long  Term Goals Verbalizes importance of monitoring SPO2 with pulse oximeter and return demonstration;Maintenance of O2 saturations>88%;Exhibits proper breathing techniques, such as pursed lip breathing or other method taught during program session;Compliance with respiratory medication             Oxygen Re-Evaluation:  Oxygen Re-Evaluation     Row Name 08/21/21 1018 10/02/21 1155 10/27/21 1122 11/21/21 1131 01/19/22 1151     Program Oxygen Prescription   Program Oxygen Prescription -- None None None None     Home Oxygen   Home Oxygen Device -- None None None None   Sleep Oxygen Prescription -- None None None None   Home Exercise Oxygen Prescription -- None None None None   Home Resting Oxygen Prescription -- None None None None   Compliance with Home Oxygen Use -- Yes Yes -- --     Goals/Expected Outcomes   Short Term Goals -- To learn and understand importance of monitoring SPO2 with pulse oximeter and demonstrate accurate use of the pulse oximeter.;To learn and understand importance of maintaining oxygen saturations>88%;To  learn and demonstrate proper pursed lip breathing techniques or other breathing techniques.  To learn and understand importance of monitoring SPO2 with pulse oximeter and demonstrate accurate use of the pulse oximeter.;To learn and understand importance of maintaining oxygen saturations>88%;To learn and demonstrate proper pursed lip breathing techniques or other breathing techniques.  To learn and understand importance of monitoring SPO2 with pulse oximeter and demonstrate accurate use of the pulse oximeter.;To learn  and understand importance of maintaining oxygen saturations>88%;To learn and demonstrate proper pursed lip breathing techniques or other breathing techniques.  To learn and understand importance of monitoring SPO2 with pulse oximeter and demonstrate accurate use of the pulse oximeter.;To learn and understand importance of maintaining oxygen saturations>88%;To learn and demonstrate proper pursed lip breathing techniques or other breathing techniques.    Long  Term Goals -- Verbalizes importance of monitoring SPO2 with pulse oximeter and return demonstration;Maintenance of O2 saturations>88%;Exhibits proper breathing techniques, such as pursed lip breathing or other method taught during program session;Compliance with respiratory medication Verbalizes importance of monitoring SPO2 with pulse oximeter and return demonstration;Maintenance of O2 saturations>88%;Exhibits proper breathing techniques, such as pursed lip breathing or other method taught during program session;Compliance with respiratory medication Verbalizes importance of monitoring SPO2 with pulse oximeter and return demonstration;Maintenance of O2 saturations>88%;Exhibits proper breathing techniques, such as pursed lip breathing or other method taught during program session;Compliance with respiratory medication Verbalizes importance of monitoring SPO2 with pulse oximeter and return demonstration;Maintenance of O2 saturations>88%;Exhibits  proper breathing techniques, such as pursed lip breathing or other method taught during program session;Compliance with respiratory medication   Comments Reviewed PLB technique with pt.  Talked about how it works and it's importance in maintaining their exercise saturations. Discussed purchasing a pulse ox to monitor her heart rate and oxygen. She is checking her HR via her BP cuff, but not during exercise. She's walking to rehab without any assistance, but wants to work on balance exercises. Alayjah is doing well in rehab. She is still getting winded walking and doing dishes.  She is good about using her PLB to help with exercise and especially with weights. She still has not gotten a pulse oximeter but just got paid so she will get one soon. Trinidy is doing well in rehab. She does not have a pulse oximeter yet, but plans to purchase soon.  She is doing well with her PLB and she uses it regularly.  She is feeling more confident with it as well. Tkai has done well in rehab. She is doing better with her breathing and using PLB routinely.  She feels like she now has better control over her breathing and knows when to back off.   Goals/Expected Outcomes Short: Become more profiecient at using PLB.   Long: Become independent at using PLB. ST: purchase a pulse ox. LT: continue to monitor HR and oxygen at home independently Short: Continue work on PLB with exercise Long: COnintue to manage breathing. Short: Continue to use PLB daily. Long: COntinue to improve breathing Short: Continue to use PLB daily. Long: COntinue to improve breathing            Oxygen Discharge (Final Oxygen Re-Evaluation):  Oxygen Re-Evaluation - 01/19/22 1151       Program Oxygen Prescription   Program Oxygen Prescription None      Home Oxygen   Home Oxygen Device None    Sleep Oxygen Prescription None    Home Exercise Oxygen Prescription None    Home Resting Oxygen Prescription None      Goals/Expected Outcomes   Short Term Goals  To learn and understand importance of monitoring SPO2 with pulse oximeter and demonstrate accurate use of the pulse oximeter.;To learn and understand importance of maintaining oxygen saturations>88%;To learn and demonstrate proper pursed lip breathing techniques or other breathing techniques.     Long  Term Goals Verbalizes importance of monitoring SPO2 with pulse oximeter and return demonstration;Maintenance of O2 saturations>88%;Exhibits proper breathing  techniques, such as pursed lip breathing or other method taught during program session;Compliance with respiratory medication    Comments Randye has done well in rehab. She is doing better with her breathing and using PLB routinely.  She feels like she now has better control over her breathing and knows when to back off.    Goals/Expected Outcomes Short: Continue to use PLB daily. Long: COntinue to improve breathing             Initial Exercise Prescription:  Initial Exercise Prescription - 08/17/21 1700       Date of Initial Exercise RX and Referring Provider   Date 08/17/21    Referring Provider Ida Rogue MD      Oxygen   Maintain Oxygen Saturation 88% or higher      NuStep   Level 1    SPM 80    Minutes 15    METs 1.4      REL-XR   Level 1    Speed 50    Minutes 15    METs 1.4      T5 Nustep   Level 1    SPM 80    Minutes 15    METs 1.4      Track   Laps 7    Minutes 15    METs 1.38      Prescription Details   Frequency (times per week) 3    Duration Progress to 30 minutes of continuous aerobic without signs/symptoms of physical distress      Intensity   THRR 40-80% of Max Heartrate 103 - 142    Ratings of Perceived Exertion 11-13    Perceived Dyspnea 0-4      Progression   Progression Continue to progress workloads to maintain intensity without signs/symptoms of physical distress.      Resistance Training   Training Prescription Yes    Weight 4 lb    Reps 10-15             Perform  Capillary Blood Glucose checks as needed.  Exercise Prescription Changes:   Exercise Prescription Changes     Row Name 08/17/21 1700 09/05/21 1100 09/21/21 1400 10/03/21 0800 10/17/21 1600     Response to Exercise   Blood Pressure (Admit) 140/70 144/98 130/70 134/64 122/76   Blood Pressure (Exercise) 154/78 130/70 -- 142/76 --   Blood Pressure (Exit) 138/78 126/70 124/78 124/64 118/64   Heart Rate (Admit) 65 bpm 76 bpm 99 bpm 96 bpm 92 bpm   Heart Rate (Exercise) 140 bpm 102 bpm 133 bpm 110 bpm 110 bpm   Heart Rate (Exit) 68 bpm 99 bpm 94 bpm 93 bpm 96 bpm   Oxygen Saturation (Admit) 96 % 96 % 96 % 97 % 96 %   Oxygen Saturation (Exercise) 98 % 95 % 97 % 93 % 93 %   Oxygen Saturation (Exit) 97 % 96 % 96 % 96 % 95 %   Rating of Perceived Exertion (Exercise) 13 13 13 13 13    Perceived Dyspnea (Exercise) 2 1 1  -- 1   Symptoms SOB SOB none none none   Comments walk test results -- -- -- --   Duration -- Continue with 30 min of aerobic exercise without signs/symptoms of physical distress. Continue with 30 min of aerobic exercise without signs/symptoms of physical distress. Continue with 30 min of aerobic exercise without signs/symptoms of physical distress. Continue with 30 min of aerobic exercise without signs/symptoms of physical distress.   Intensity --  THRR unchanged THRR unchanged THRR unchanged THRR unchanged     Progression   Progression -- Continue to progress workloads to maintain intensity without signs/symptoms of physical distress. Continue to progress workloads to maintain intensity without signs/symptoms of physical distress. Continue to progress workloads to maintain intensity without signs/symptoms of physical distress. Continue to progress workloads to maintain intensity without signs/symptoms of physical distress.   Average METs -- 1.45 1.58 1.54 1.92     Resistance Training   Training Prescription -- Yes Yes Yes Yes   Weight -- 4 lb 4 lb 4 lb 4 lb   Reps -- 10-15 10-15  10-15 10-15     Interval Training   Interval Training -- No No No No     Treadmill   MPH -- -- 1.1 1.2 --   Grade -- -- 0 0 --   Minutes -- -- 15 15 --   METs -- -- 1.8 1.92 --     Arm Ergometer   Level -- 1 -- -- --   Minutes -- 15 -- -- --   METs -- 1.7 -- -- --     REL-XR   Level -- $Remove'1 3 3 2   'VLarwiC$ Minutes -- $RemoveBe'15 15 15 15   'hNbQkMgYC$ METs -- 1.2 1.2 1.1 --     T5 Nustep   Level -- -- 1 -- 2   Minutes -- -- 15 -- 30   METs -- -- 1.5 -- 2     Track   Laps -- -- 15 11 --   Minutes -- -- 15 15 --   METs -- -- 1.76 1.6 --     Home Exercise Plan   Plans to continue exercise at -- -- -- -- Home (comment)  walking; baseball field   Frequency -- -- -- -- Add 2 additional days to program exercise sessions.   Initial Home Exercises Provided -- -- -- -- 10/02/21     Oxygen   Maintain Oxygen Saturation -- 88% or higher 88% or higher 88% or higher 88% or higher    Row Name 10/30/21 1500 11/13/21 1500 11/21/21 1100 11/29/21 1100 12/12/21 0800     Response to Exercise   Blood Pressure (Admit) 124/72 122/78 -- 108/72 110/60   Blood Pressure (Exit) 136/70 116/72 -- 102/66 130/78   Heart Rate (Admit) 99 bpm 94 bpm -- 93 bpm 91 bpm   Heart Rate (Exercise) 110 bpm 108 bpm -- 127 bpm 131 bpm   Heart Rate (Exit) 103 bpm 98 bpm -- 107 bpm 108 bpm   Oxygen Saturation (Admit) 95 % 98 % -- 97 % 96 %   Oxygen Saturation (Exercise) 94 % 98 % -- 95 % 93 %   Oxygen Saturation (Exit) 97 % 98 % -- 98 % 95 %   Rating of Perceived Exertion (Exercise) 13 12 -- 13 13   Perceived Dyspnea (Exercise) 1 1 -- 2 1   Symptoms none none -- SOB none   Duration Continue with 30 min of aerobic exercise without signs/symptoms of physical distress. Continue with 30 min of aerobic exercise without signs/symptoms of physical distress. -- Continue with 30 min of aerobic exercise without signs/symptoms of physical distress. Continue with 30 min of aerobic exercise without signs/symptoms of physical distress.   Intensity THRR  unchanged THRR unchanged -- THRR unchanged THRR unchanged     Progression   Progression Continue to progress workloads to maintain intensity without signs/symptoms of physical distress. Continue to progress workloads to maintain  intensity without signs/symptoms of physical distress. -- Continue to progress workloads to maintain intensity without signs/symptoms of physical distress. Continue to progress workloads to maintain intensity without signs/symptoms of physical distress.   Average METs 2 3.4 -- 2.48 2.75     Resistance Training   Training Prescription Yes Yes -- Yes Yes   Weight 4 lb 4 lb -- 4 lb 4 lb   Reps 10-15 10-15 -- 10-15 10-15     Interval Training   Interval Training No No -- No No     Treadmill   MPH -- -- -- 1.2 1.7   Grade -- -- -- 0 0   Minutes -- -- -- 15 15   METs -- -- -- 1.92 2.3     REL-XR   Level 2 5 -- 4 3   Minutes 15 15 -- 30 30   METs 1.3 3.4 -- 2.3 2     T5 Nustep   Level 3 -- -- -- --   Minutes 30 -- -- -- --   METs 2.6 -- -- -- --     Home Exercise Plan   Plans to continue exercise at Home (comment)  walking; baseball field Home (comment)  walking; baseball field Home (comment)  walking; PT exercises Home (comment)  walking; PT exercises Home (comment)  walking; PT exercises   Frequency Add 2 additional days to program exercise sessions. Add 2 additional days to program exercise sessions. Add 2 additional days to program exercise sessions. Add 2 additional days to program exercise sessions. Add 2 additional days to program exercise sessions.   Initial Home Exercises Provided 10/02/21 10/02/21 11/21/21 11/21/21 11/21/21     Oxygen   Maintain Oxygen Saturation 88% or higher 88% or higher 88% or higher 88% or higher 88% or higher    Row Name 12/25/21 1500 01/25/22 0800           Response to Exercise   Blood Pressure (Admit) 114/72 122/60      Blood Pressure (Exit) 112/68 94/58      Heart Rate (Admit) 106 bpm 103 bpm      Heart Rate  (Exercise) 133 bpm 110 bpm      Heart Rate (Exit) 107 bpm 96 bpm      Oxygen Saturation (Admit) 98 % 98 %      Oxygen Saturation (Exercise) 96 % 97 %      Oxygen Saturation (Exit) 98 % 98 %      Rating of Perceived Exertion (Exercise) 13 14      Perceived Dyspnea (Exercise) -- 2      Symptoms none SOB      Duration Continue with 30 min of aerobic exercise without signs/symptoms of physical distress. Continue with 30 min of aerobic exercise without signs/symptoms of physical distress.      Intensity THRR unchanged THRR unchanged        Progression   Progression Continue to progress workloads to maintain intensity without signs/symptoms of physical distress. Continue to progress workloads to maintain intensity without signs/symptoms of physical distress.      Average METs 2.9 3.34        Resistance Training   Training Prescription Yes Yes      Weight 5 lb 5 lb      Reps 10-15 10-15        Interval Training   Interval Training No No        Treadmill   MPH 1.7 2  Grade 0 0      Minutes 15 15      METs 2.3 2.53        REL-XR   Level 3 5      Minutes 15 15      METs 3.4 4.8        Biostep-RELP   Level -- 4      Minutes -- 15      METs -- 4        Home Exercise Plan   Plans to continue exercise at Home (comment)  walking; PT exercises Home (comment)  walking; PT exercises      Frequency Add 2 additional days to program exercise sessions. Add 2 additional days to program exercise sessions.      Initial Home Exercises Provided 11/21/21 11/21/21        Oxygen   Maintain Oxygen Saturation 88% or higher 88% or higher               Exercise Comments:   Exercise Goals and Review:   Exercise Goals     Row Name 08/17/21 1719             Exercise Goals   Increase Physical Activity Yes       Intervention Provide advice, education, support and counseling about physical activity/exercise needs.;Develop an individualized exercise prescription for aerobic and  resistive training based on initial evaluation findings, risk stratification, comorbidities and participant's personal goals.       Expected Outcomes Short Term: Attend rehab on a regular basis to increase amount of physical activity.;Long Term: Add in home exercise to make exercise part of routine and to increase amount of physical activity.;Long Term: Exercising regularly at least 3-5 days a week.       Increase Strength and Stamina Yes       Intervention Provide advice, education, support and counseling about physical activity/exercise needs.;Develop an individualized exercise prescription for aerobic and resistive training based on initial evaluation findings, risk stratification, comorbidities and participant's personal goals.       Expected Outcomes Short Term: Increase workloads from initial exercise prescription for resistance, speed, and METs.;Short Term: Perform resistance training exercises routinely during rehab and add in resistance training at home;Long Term: Improve cardiorespiratory fitness, muscular endurance and strength as measured by increased METs and functional capacity (6MWT)       Able to understand and use rate of perceived exertion (RPE) scale Yes       Intervention Provide education and explanation on how to use RPE scale       Expected Outcomes Short Term: Able to use RPE daily in rehab to express subjective intensity level;Long Term:  Able to use RPE to guide intensity level when exercising independently       Able to understand and use Dyspnea scale Yes       Intervention Provide education and explanation on how to use Dyspnea scale       Expected Outcomes Short Term: Able to use Dyspnea scale daily in rehab to express subjective sense of shortness of breath during exertion;Long Term: Able to use Dyspnea scale to guide intensity level when exercising independently       Knowledge and understanding of Target Heart Rate Range (THRR) Yes       Intervention Provide education and  explanation of THRR including how the numbers were predicted and where they are located for reference       Expected Outcomes Short Term: Able to state/look  up THRR;Short Term: Able to use daily as guideline for intensity in rehab;Long Term: Able to use THRR to govern intensity when exercising independently       Able to check pulse independently Yes       Intervention Provide education and demonstration on how to check pulse in carotid and radial arteries.;Review the importance of being able to check your own pulse for safety during independent exercise       Expected Outcomes Short Term: Able to explain why pulse checking is important during independent exercise;Long Term: Able to check pulse independently and accurately       Understanding of Exercise Prescription Yes       Intervention Provide education, explanation, and written materials on patient's individual exercise prescription       Expected Outcomes Short Term: Able to explain program exercise prescription;Long Term: Able to explain home exercise prescription to exercise independently                Exercise Goals Re-Evaluation :  Exercise Goals Re-Evaluation     Row Name 08/21/21 1015 09/05/21 1129 09/11/21 1151 09/21/21 1419 10/02/21 1110     Exercise Goal Re-Evaluation   Exercise Goals Review Increase Physical Activity;Able to understand and use rate of perceived exertion (RPE) scale;Knowledge and understanding of Target Heart Rate Range (THRR);Understanding of Exercise Prescription;Increase Strength and Stamina;Able to check pulse independently;Able to understand and use Dyspnea scale Increase Physical Activity;Increase Strength and Stamina;Understanding of Exercise Prescription Increase Physical Activity;Increase Strength and Stamina;Understanding of Exercise Prescription Increase Physical Activity;Increase Strength and Stamina;Understanding of Exercise Prescription Increase Physical Activity;Increase Strength and  Stamina;Understanding of Exercise Prescription   Comments Reviewed RPE and dyspnea scales, THR and program prescription with pt today.  Pt voiced understanding and was given a copy of goals to take home. Lailah is off to a good start in rehab.  She has completed her first four days of exercise thus far.  She is now going for the 30 minutes.  We will continue to monitor her progress. Reginna is walking at home around a track for ~1 hour with her husband. She has not gone over home exercise with EP. Anayely is doing well in rehab. She increased her laps on the track from 7 to 15 in total. She also moved up to level 3 on the XR. She tried out the treadmill for the first time at 1.1 mph speed which about the same speed as her track. We hope to see that improve over time. Will continue to monitor. Marcene Brawn, [place], reviewed home exercise with pt today.  Pt is walking 30-45 min at a baseball field and breaking during laps. Pt is walking 5-6 days per week. Pt plans to continue walking at the baseball field for exercise.  Reviewed THR, pulse, RPE, sign and symptoms, pulse oximetery and when to call 911 or MD.  Also discussed weather considerations and indoor options.  Pt voiced understanding. Pt requested explanation of METs and assistance with balance exercise; balance printout and staff youtube video information provided.   Expected Outcomes Short: Use RPE daily to regulate intensity. Long: Follow program prescription in THR. Short: Continue to attend rehab regularly Long: Continue to follow program prescription ST: EP to go over home exercise LT: Continue to follow program prescription Short: Increase speed on treadmill Long: Continue to increase overall MET level Short: minimize breaks during walking exercise Long: continue exercising independently at home    [place] Name 10/03/21 0832 10/17/21 1656 10/27/21 1106 10/30/21 1519 11/13/21  1532     Exercise Goal Re-Evaluation   Exercise Goals Review Increase Physical Activity;Increase  Strength and Stamina;Understanding of Exercise Prescription Increase Physical Activity;Increase Strength and Stamina;Understanding of Exercise Prescription Increase Physical Activity;Increase Strength and Stamina;Understanding of Exercise Prescription Increase Physical Activity;Increase Strength and Stamina;Understanding of Exercise Prescription Increase Physical Activity;Increase Strength and Stamina;Understanding of Exercise Prescription   Comments Jaeley is doing well in rehab. She increased her speed on the treadmill to 1.2 mph. She has also tolerated using 4 lb hand weights for resistance training. She could benefit from pushing it to do more laps on the track. We will continue to monitor her progress in the program. Kimberlin continues to do well in rehab. She did not walk at all since last review. She was able to increase to level 2 on the T5 Nustep. Her level on the XR is inconsistent and would  benefit from bringing that back up.  Oxygen is staying above 88%. Will continue to monitor. Janele is doing well in rehab.  She has been stretching and PT exercises in addition to what we have been doing in class.  She is walking at the baseball field 90 ft at a time. She is getting more strength back, but not feeling like her stamina is getting better.  She wants to go harder, but limited by walking.  She is looking into [employer] so that she can use some seated equipment.  We talked about picking the one that she is more likely to go to.  When knows that when she feels progress, she is more motivated to go. We also talked about using the staff videos on bad weather days. Loriis doing well in rehab. She recently improved to level 3 on the T5 machine. She also improved her overall average METs to 2 METs. She has also tolerated 4 lb hand weights for resistance training. We will continue to monitor her progress in the program. Amiri has only attended rehab once since the last evaluation. She did improve to  level 5 on the XR machine. She has also tolerated using 4 lb hand weights for resistance training. We will continue to monitor her progress in the program.   Expected Outcomes Short: Continue to push for more laps on the track. Long: Continue to increase stamina. Short: Increase XR level back up and get back to walking Long: Continue to increase overall MET level Short: Continue to walk on off days Long: Conitnue to improve stamina Short: Continue to follow exercise prescription. Long: Conitnue to improve strength and stamina. Short: Continue to attend rehab regularly. Long: Continue to increase overall MET levels.    [place] Name 11/21/21 1120 11/29/21 1157 12/12/21 0811 12/25/21 1544 01/10/22 1154     Exercise Goal Re-Evaluation   Exercise Goals Review Increase Physical Activity;Increase Strength and Stamina;Understanding of Exercise Prescription;Able to understand and use rate of perceived exertion (RPE) scale;Knowledge and understanding of Target Heart Rate Range (THRR);Able to understand and use Dyspnea scale;Able to check pulse independently Increase Physical Activity;Increase Strength and Stamina;Understanding of Exercise Prescription Increase Physical Activity;Increase Strength and Stamina;Understanding of Exercise Prescription Increase Physical Activity;Increase Strength and Stamina;Understanding of Exercise Prescription --   Comments Reviewed home exercise with pt today.  Pt plans to walk and use staff videos at home for exercise.  She also has PT exercises.  Reviewed THR, pulse, RPE, sign and symptoms, pulse oximetery and when to call 911 or MD.  Also discussed weather considerations and indoor options.  Pt voiced understanding.  Jameika does not have a pulse oximeter at home, but plans to get one. She also wants to work on her balance. Theadora is doing well in rehab. She improved back to level 4 on the XR. She also had an average overall MET level of 2.48 METs. She has tolerated using 4 lb hand weights for  resistance training as well. Arna continues to do well in rehab. She increased her speed on the treadmill to 1.7 speed which is the highest she has achieved yet. She'd benefit from increasing to 5 lb handweights. She is also due for her post soon and we hope to see significant improvement. Will continue to monitor. Aaira last attended on 12/13/21.  She had worked up to her full time on the treadmill without rest breaks.  She is also up to level 3 on the XR.  We will continue to monitor her progress. Beata last attended on 12/13/21.  She had worked up to her full time on the treadmill without rest breaks.  She will be looking to improve on her post as well.  We will continue to monitor her progress.   Expected Outcomes Short: Start to add in more exercise at home Long: conitnue to improve stamina Short: Try level 5 on the XR. Long: Continue to improve strength and stamina. Short: Improve on Long: Continue to increase overall MET level Short: Return to regular attendance Long: Continue to improve stamina Short: Return to regular attendance and graduate Long: Continue to improve strength and stamina    Row Name 01/19/22 1144 01/25/22 0803           Exercise Goal Re-Evaluation   Exercise Goals Review Increase Physical Activity;Increase Strength and Stamina;Understanding of Exercise Prescription Increase Physical Activity;Increase Strength and Stamina;Understanding of Exercise Prescription      Comments Shaquandra will be graduating next week.  She improved her walk test by more than 300 ft!!  She is very pleased with her progress.  She is planning to keep exercising by going to [employer]. Vanetta continues to do well, and will be graduating next week. She improved by over 40% on her post and also increased her treadmill speed to 2.0 mph.  We will continue to monitor her until she graduates from the program.      Expected Outcomes Continue to exercise independently Short: Graduate Long: Continue  independent exercise at appropriate prescription               Discharge Exercise Prescription (Final Exercise Prescription Changes):  Exercise Prescription Changes - 01/25/22 0800       Response to Exercise   Blood Pressure (Admit) 122/60    Blood Pressure (Exit) 94/58    Heart Rate (Admit) 103 bpm    Heart Rate (Exercise) 110 bpm    Heart Rate (Exit) 96 bpm    Oxygen Saturation (Admit) 98 %    Oxygen Saturation (Exercise) 97 %    Oxygen Saturation (Exit) 98 %    Rating of Perceived Exertion (Exercise) 14    Perceived Dyspnea (Exercise) 2    Symptoms SOB    Duration Continue with 30 min of aerobic exercise without signs/symptoms of physical distress.    Intensity THRR unchanged      Progression   Progression Continue to progress workloads to maintain intensity without signs/symptoms of physical distress.    Average METs 3.34      Resistance Training   Training Prescription Yes    Weight 5 lb  Reps 10-15      Interval Training   Interval Training No      Treadmill   MPH 2    Grade 0    Minutes 15    METs 2.53      REL-XR   Level 5    Minutes 15    METs 4.8      Biostep-RELP   Level 4    Minutes 15    METs 4      Home Exercise Plan   Plans to continue exercise at Home (comment)   walking; PT exercises   Frequency Add 2 additional days to program exercise sessions.    Initial Home Exercises Provided 11/21/21      Oxygen   Maintain Oxygen Saturation 88% or higher             Nutrition:  Target Goals: Understanding of nutrition guidelines, daily intake of sodium '1500mg'$ , cholesterol '200mg'$ , calories 30% from fat and 7% or less from saturated fats, daily to have 5 or more servings of fruits and vegetables.  Education: All About Nutrition: -Group instruction provided by verbal, written material, interactive activities, discussions, models, and posters to present general guidelines for heart healthy nutrition including fat, fiber, MyPlate, the role  of sodium in heart healthy nutrition, utilization of the nutrition label, and utilization of this knowledge for meal planning. Follow up email sent as well. Written material given at graduation.   Biometrics:  Pre Biometrics - 08/17/21 1716       Pre Biometrics   Height 5' 7.25" (1.708 m)    Weight 295 lb 11.2 oz (134.1 kg)    BMI (Calculated) 45.98    Single Leg Stand 2.3 seconds             Post Biometrics - 01/17/22 1145        Post  Biometrics   Height 5' 7.25" (1.708 m)    Weight 258 lb 12.8 oz (117.4 kg)    BMI (Calculated) 40.24    Single Leg Stand 2.5 seconds             Nutrition Therapy Plan and Nutrition Goals:  Nutrition Therapy & Goals - 09/11/21 1135       Nutrition Therapy   RD appointment deferred Yes   Seeing RD s/p bariatric surgery - she has her 6 month appointment soon. Will defer nutrition until 6 month bariatric surgery RD appointment.     Personal Nutrition Goals   Nutrition Goal Seeing RD s/p bariatric surgery - she has her 6 month appointment soon. Will defer nutrition until 6 month bariatric surgery RD appointment.             Nutrition Assessments:  MEDIFICTS Score Key: ?70 Need to make dietary changes  40-70 Heart Healthy Diet ? 40 Therapeutic Level Cholesterol Diet  Flowsheet Row Pulmonary Rehab from 08/17/2021 in [facility]  Picture Your Plate Total Score on Admission 64      Picture Your Plate Scores: <04 Unhealthy dietary pattern with much room for improvement. 41-50 Dietary pattern unlikely to meet recommendations for good health and room for improvement. 51-60 More healthful dietary pattern, with some room for improvement.  >60 Healthy dietary pattern, although there may be some specific behaviors that could be improved.   Nutrition Goals Re-Evaluation:  Nutrition Goals Re-Evaluation     [place] Name 10/02/21 1139 10/27/21 1117 11/21/21 1130 01/19/22 1148       Goals   Current Weight  262 lb  (118.8 kg) -- -- --    Nutrition Goal Continues to defer RD appointment. Continues to defer RD appointment. Continues to defer RD appointment. Continues to defer RD appointment.    Comment Talayeh states she is working with her bariatric doctor's office for her nutritional needs. She currently defers a Counselling psychologist. Christy is doing well with her diet.  She does feel fatigued when she eats and will talk to dietitian about this. Rayya continues to work with her bariatric dietitian already.  She is following her diet and doing well.  She continues to get a good variety. Matina continues to work with her Mining engineer. She is following her diet and doing well and plans to continues.   She continues to get a good variety.    Expected Outcome ST: continue meeting with bariatric diet plan LT: weigh less than 200 lbs. ST: continue meeting with bariatric diet plan LT: weigh less than 200 lbs. ST: continue meeting with bariatric diet plan LT: weigh less than 200 lbs. Continue with bariatic diet             Nutrition Goals Discharge (Final Nutrition Goals Re-Evaluation):  Nutrition Goals Re-Evaluation - 01/19/22 1148       Goals   Nutrition Goal Continues to defer RD appointment.    Comment Kamoria continues to work with her Mining engineer. She is following her diet and doing well and plans to continues.   She continues to get a good variety.    Expected Outcome Continue with bariatic diet             Psychosocial: Target Goals: Acknowledge presence or absence of significant depression and/or stress, maximize coping skills, provide positive support system. Participant is able to verbalize types and ability to use techniques and skills needed for reducing stress and depression.   Education: Stress, Anxiety, and Depression - Group verbal and visual presentation to define topics covered.  Reviews how body is impacted by stress, anxiety, and depression.  Also discusses healthy ways to reduce  stress and to treat/manage anxiety and depression.  Written material given at graduation.   Education: Sleep Hygiene -Provides group verbal and written instruction about how sleep can affect your health.  Define sleep hygiene, discuss sleep cycles and impact of sleep habits. Review good sleep hygiene tips.    Initial Review & Psychosocial Screening:  Initial Psych Review & Screening - 10/02/21 1133       Initial Review   Current issues with Current Depression;Current Psychotropic Meds;History of Depression;Current Stress Concerns;Current Sleep Concerns             Quality of Life Scores:  Scores of 19 and below usually indicate a poorer quality of life in these areas.  A difference of  2-3 points is a clinically meaningful difference.  A difference of 2-3 points in the total score of the Quality of Life Index has been associated with significant improvement in overall quality of life, self-image, physical symptoms, and general health in studies assessing change in quality of life.  PHQ-9: Review Flowsheet  More data exists      11/21/2021 11/13/2021 10/27/2021 10/13/2021 09/11/2021  Depression screen PHQ 2/9  Decreased Interest 2 1 1 1 1   Down, Depressed, Hopeless 2 1 1 1 1   PHQ - 2 Score 4 2 2 2 2   Altered sleeping 3 3 2 3 3   Tired, decreased energy 3 3 1 2 2   Change in appetite 0 0 0 0  0  Feeling bad or failure about yourself  3 3 3 3 2   Trouble concentrating 2 2 3 3 2   Moving slowly or fidgety/restless 0 2 2 3 1   Suicidal thoughts 0 0 0 - 0  PHQ-9 Score 15 15 13 16 12   Difficult doing work/chores Very difficult Very difficult Somewhat difficult Somewhat difficult Somewhat difficult   Interpretation of Total Score  Total Score Depression Severity:  1-4 = Minimal depression, 5-9 = Mild depression, 10-14 = Moderate depression, 15-19 = Moderately severe depression, 20-27 = Severe depression   Psychosocial Evaluation and Intervention:  Psychosocial Evaluation - 08/14/21 1129        Psychosocial Evaluation & Interventions   Interventions Stress management education;Encouraged to exercise with the program and follow exercise prescription    Comments She is out of work with a sever autistic son. She is generally negative and is trying to be more positive. She talks to a therapist and takes medsication to help her cope better. She is determined to exercise, get weight off and be more positive.    Expected Outcomes Short: Start [facility] to help with mood. Long: Maintain a healthy mental state.    Continue Psychosocial Services  Follow up required by staff             Psychosocial Re-Evaluation:  Psychosocial Re-Evaluation     [place] Name 09/11/21 1137 10/02/21 1133 10/27/21 1112 11/21/21 1121 01/19/22 1146     Psychosocial Re-Evaluation   Current issues with Current Depression;Current Anxiety/Panic;Current Sleep Concerns;Current Psychotropic Meds Current Depression;Current Anxiety/Panic;Current Sleep Concerns;Current Psychotropic Meds;Current Stress Concerns Current Depression;Current Anxiety/Panic;Current Sleep Concerns;Current Psychotropic Meds;Current Stress Concerns Current Depression;Current Anxiety/Panic;Current Sleep Concerns;Current Psychotropic Meds;Current Stress Concerns Current Depression;Current Anxiety/Panic;Current Sleep Concerns;Current Psychotropic Meds;Current Stress Concerns   Comments Monette reports doing well with her mental health and is now on medication as well as seeing a therapist 1x/week. She relies on her husband, best friend, therapist, and doctors for support. She reports walking, driving in nice weather, and staying busy to reduce stressors. She is not sleeping well right now due to stomach pain - she is working with her MD on this. [place] states that therapy has been good and she continues to go. She is compliant with her medication and states they are effective. Sleep has improved, but she desires for continued improvement. She feels the biggest  barrier to sleeping well is caring for her special-needs son to monitor his behavior. Her husband is supportive and helps in caring for the son and other duties. Stress: her daughter's car is broke-down now and they is helping her with transportation of her to work and her child to school. Managing stress with exercise and talking with her husband. Shuronda is doing well in rehab.  Her PHQ has improve 3 points over the last two weeks.  She is feeling better and moving more.  She is still struggling with her sleep.  When she eats she gets very fatigued and has to rest.  She continues to care for her son who is ready to go on vacation.  He has been having mostly good days, but will sneak into kitchen to get into things. Stevey is doing well in rehab. Her PHQ score has shifted around, but score remains the same.  She has her usual issues with her family and son getting out the door.  Her son has a harder time with things in morning.  Her finances got mixed up this month and they are short.  Her  balance seems to be getting worse which she finds frustrating.  She enjoys exercise and knows that it makes her feel better.  She knows that she needs to focus and be displined. Apurva has enjoyed rehab.  She is pleased with her progress and has really benefited from the routine of coming and doing her exercise.  She is able to do more and feels better overall.  She is also feeling better equipped and coping better with her stresses at home. She is sleeping well.   Expected Outcomes ST: continue to do stress reducing activites  LT: maintain positive attitude ST: continue to attend rehab, exercise at home, and attend therapy. LT: continue to maintain positive attitude and utilize exercise for stress management Short: Continue to exercise for mental outlet and boost Long: Continue to focus on positive Short: Make time to exercise and take care of herself LOng: continue to focus on the successes Make time to go exercise for herself    Interventions Stress management education;Encouraged to attend Cardiac Rehabilitation for the exercise Stress management education;Encouraged to attend Pulmonary Rehabilitation for the exercise Stress management education;Encouraged to attend Pulmonary Rehabilitation for the exercise Stress management education;Encouraged to attend Pulmonary Rehabilitation for the exercise Encouraged to attend Pulmonary Rehabilitation for the exercise   Continue Psychosocial Services  Follow up required by staff Follow up required by staff Follow up required by staff Follow up required by staff --     Initial Review   Source of Stress Concerns -- Chronic Illness;Occupation;Family -- Chronic Illness;Occupation;Family --   Comments -- She continues to care for her son and help her other children. She seems positive with her current situation and reaches out for help as needed. She is taking her medication and seeing her therapist. -- -- --            Psychosocial Discharge (Final Psychosocial Re-Evaluation):  Psychosocial Re-Evaluation - 01/19/22 1146       Psychosocial Re-Evaluation   Current issues with Current Depression;Current Anxiety/Panic;Current Sleep Concerns;Current Psychotropic Meds;Current Stress Concerns    Comments Ramaya has enjoyed rehab.  She is pleased with her progress and has really benefited from the routine of coming and doing her exercise.  She is able to do more and feels better overall.  She is also feeling better equipped and coping better with her stresses at home. She is sleeping well.    Expected Outcomes Make time to go exercise for herself    Interventions Encouraged to attend Pulmonary Rehabilitation for the exercise             Education: Education Goals: Education classes will be provided on a weekly basis, covering required topics. Participant will state understanding/return demonstration of topics presented.  Learning Barriers/Preferences:  Learning  Barriers/Preferences - 08/14/21 1123       Learning Barriers/Preferences   Learning Barriers Sight    Learning Preferences Skilled Demonstration;Video             General Pulmonary Education Topics:  Infection Prevention: - Provides verbal and written material to individual with discussion of infection control including proper hand washing and proper equipment cleaning during exercise session. Flowsheet Row Pulmonary Rehab from 08/14/2021 in [facility] Cardiac and Pulmonary Rehab  Date 08/14/21  Educator [facility]  Instruction Review Code 1- Verbalizes Understanding       Falls Prevention: - Provides verbal and written material to individual with discussion of falls prevention and safety. Flowsheet Row Pulmonary Rehab from 08/14/2021 in [facility] Cardiac and Pulmonary Rehab  Date 08/14/21  Educator [facility]  Instruction Review Code 1- Verbalizes Understanding       Chronic Lung Disease Review: - Group verbal instruction with posters, models, PowerPoint presentations and videos,  to review new updates, new respiratory medications, new advancements in procedures and treatments. Providing information on websites and "800" numbers for continued self-education. Includes information about supplement oxygen, available portable oxygen systems, continuous and intermittent flow rates, oxygen safety, concentrators, and Medicare reimbursement for oxygen. Explanation of Pulmonary Drugs, including class, frequency, complications, importance of spacers, rinsing mouth after steroid MDI's, and proper cleaning methods for nebulizers. Review of basic lung anatomy and physiology related to function, structure, and complications of lung disease. Review of risk factors. Discussion about methods for diagnosing sleep apnea and types of masks and machines for OSA. Includes a review of the use of types of environmental controls: home humidity, furnaces, filters, dust mite/pet prevention, HEPA vacuums. Discussion about weather  changes, air quality and the benefits of nasal washing. Instruction on Warning signs, infection symptoms, calling MD promptly, preventive modes, and value of vaccinations. Review of effective airway clearance, coughing and/or vibration techniques. Emphasizing that all should Create an Action Plan. Written material given at graduation. Flowsheet Row Pulmonary Rehab from 12/06/2021 in [facility] Cardiac and Pulmonary Rehab  Education need identified 08/17/21       AED/CPR: - Group verbal and written instruction with the use of models to demonstrate the basic use of the AED with the basic ABC's of resuscitation.    Anatomy and Cardiac Procedures: - Group verbal and visual presentation and models provide information about basic cardiac anatomy and function. Reviews the testing methods done to diagnose heart disease and the outcomes of the test results. Describes the treatment choices: Medical Management, Angioplasty, or Coronary Bypass Surgery for treating various heart conditions including Myocardial Infarction, Angina, Valve Disease, and Cardiac Arrhythmias.  Written material given at graduation.   Medication Safety: - Group verbal and visual instruction to review commonly prescribed medications for heart and lung disease. Reviews the medication, class of the drug, and side effects. Includes the steps to properly store meds and maintain the prescription regimen.  Written material given at graduation.   Other: -Provides group and verbal instruction on various topics (see comments)   Knowledge Questionnaire Score:  Knowledge Questionnaire Score - 08/17/21 1702       Knowledge Questionnaire Score   Pre Score 16/18: Exercise, Oxygen              Core Components/Risk Factors/Patient Goals at Admission:  Personal Goals and Risk Factors at Admission - 08/17/21 1720       Core Components/Risk Factors/Patient Goals on Admission    Weight Management Yes;Obesity;Weight Loss    Intervention  Weight Management: Develop a combined nutrition and exercise program designed to reach desired caloric intake, while maintaining appropriate intake of nutrient and fiber, sodium and fats, and appropriate energy expenditure required for the weight goal.;Weight Management: Provide education and appropriate resources to help participant work on and attain dietary goals.;Weight Management/Obesity: Establish reasonable short term and long term weight goals.;Obesity: Provide education and appropriate resources to help participant work on and attain dietary goals.    Admit Weight 295 lb (133.8 kg)    Goal Weight: Short Term 290 lb (131.5 kg)    Goal Weight: Long Term 240 lb (108.9 kg)    Expected Outcomes Short Term: Continue to assess and modify interventions until short term weight is achieved;Long Term: Adherence to nutrition and physical activity/exercise program  aimed toward attainment of established weight goal;Weight Loss: Understanding of general recommendations for a balanced deficit meal plan, which promotes 1-2 lb weight loss per week and includes a negative energy balance of [phone] kcal/d;Understanding recommendations for meals to include 15-35% energy as protein, 25-35% energy from fat, 35-60% energy from carbohydrates, less than $RemoveB'200mg'WdTdfeJW$  of dietary cholesterol, 20-35 gm of total fiber daily;Understanding of distribution of calorie intake throughout the day with the consumption of 4-5 meals/snacks    Improve shortness of breath with ADL's Yes    Intervention Provide education, individualized exercise plan and daily activity instruction to help decrease symptoms of SOB with activities of daily living.    Expected Outcomes Short Term: Improve cardiorespiratory fitness to achieve a reduction of symptoms when performing ADLs;Long Term: Be able to perform more ADLs without symptoms or delay the onset of symptoms    Diabetes Yes    Intervention Provide education about signs/symptoms and action to take for  hypo/hyperglycemia.;Provide education about proper nutrition, including hydration, and aerobic/resistive exercise prescription along with prescribed medications to achieve blood glucose in normal ranges: Fasting glucose 65-99 mg/dL    Expected Outcomes Short Term: Participant verbalizes understanding of the signs/symptoms and immediate care of hyper/hypoglycemia, proper foot care and importance of medication, aerobic/resistive exercise and nutrition plan for blood glucose control.;Long Term: Attainment of HbA1C < 7%.    Hypertension Yes    Intervention Monitor prescription use compliance.;Provide education on lifestyle modifcations including regular physical activity/exercise, weight management, moderate sodium restriction and increased consumption of fresh fruit, vegetables, and low fat dairy, alcohol moderation, and smoking cessation.    Expected Outcomes Short Term: Continued assessment and intervention until BP is < 140/2mm HG in hypertensive participants. < 130/46mm HG in hypertensive participants with diabetes, heart failure or chronic kidney disease.;Long Term: Maintenance of blood pressure at goal levels.    Lipids Yes    Intervention Provide education and support for participant on nutrition & aerobic/resistive exercise along with prescribed medications to achieve LDL '70mg'$ , HDL >$Remo'40mg'Ghsbz$ .    Expected Outcomes Short Term: Participant states understanding of desired cholesterol values and is compliant with medications prescribed. Participant is following exercise prescription and nutrition guidelines.;Long Term: Cholesterol controlled with medications as prescribed, with individualized exercise RX and with personalized nutrition plan. Value goals: LDL < $Rem'70mg'NWAC$ , HDL > 40 mg.             Education:Diabetes - Individual verbal and written instruction to review signs/symptoms of diabetes, desired ranges of glucose level fasting, after meals and with exercise. Acknowledge that pre and post exercise  glucose checks will be done for 3 sessions at entry of program. Flowsheet Row Pulmonary Rehab from 08/14/2021 in [facility] Cardiac and Pulmonary Rehab  Date 08/14/21  Educator [facility]  Instruction Review Code 1- Verbalizes Understanding       Know Your Numbers and Heart Failure: - Group verbal and visual instruction to discuss disease risk factors for cardiac and pulmonary disease and treatment options.  Reviews associated critical values for Overweight/Obesity, Hypertension, Cholesterol, and Diabetes.  Discusses basics of heart failure: signs/symptoms and treatments.  Introduces Heart Failure Zone chart for action plan for heart failure.  Written material given at graduation.   Core Components/Risk Factors/Patient Goals Review:   Goals and Risk Factor Review     Row Name 09/11/21 1141 10/02/21 1122 10/27/21 1118 11/21/21 1127 01/19/22 1149     Core Components/Risk Factors/Patient Goals Review   Personal Goals Review Weight Management/Obesity;Diabetes Weight Management/Obesity;Hypertension;Diabetes;Improve shortness of breath with ADL's Weight  Management/Obesity;Hypertension;Diabetes;Improve shortness of breath with ADL's;Increase knowledge of respiratory medications and ability to use respiratory devices properly. 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 She reports having stomach pain which is making it hard to tolerate food and her medications; she is unable to take her gastric bypass vitamins - her doctor suggested taking 2 MVI per day in the interim. her doctor is going to order a CT to investigate causes. She continues to check her BG at home in the monring - running in the 90s; her last A1C is now 7%, she is due for a new one soon. She is dealing with muscle soreness from moving her body  more due to rehab and exercise at home - she takes tylenol for this. Doloras continues to lose weight since the bypass surgery. She is currently at 262 lb per her home scales. Her goal is to lose below 200 lbs. She states that weight loss is sporadic, losing larger amounts then plateauing. She understands that this is related to the surgery. She is having an upper GI on 6/6 to determine if she is having a food intolerance. Diabetes: Iyannah says her sugars have been good but they are creeping up and has an appointment scheduled this week. She has been drinking orange juice and thinks that is related, but has been diluting the orange juice with water to reduce the sugar level and having crystal light. She isn't currently on any diabetic medication and her average BG level is 90-100 fasting. She will discuss this further with her physician to determine their goals for her. ADLs: Pt says it's easier to stand for longer periods of time with less sitting breaks to wash dishes. Lakevia has been walking to rehab and finds it easier to get up from the couch. However, she has some instability/balance concerns and was encouraged to use assistance to stabilize self. She states her husband is mindful and supportive of her during her instability. She will be discussing this concern with her physician on Thursday, 6/7. Hypertension: Khristie states her blood pressure has remained stable. Amsi is doing well in rehab. She continues to work on weight loss is down to 258.  She is doing well with her sugar control and has noticed that orange juice was driving it up, so she cut back. She is doing well with her breathing and is trying to do more at home.  She still gets winded walking but better on seated on equipment.  Her pressures have continued to be good. Hollin is doing well in rehab. She continues to lose weight, but wants to maintain her muscular strength.  Her pressures are doing well and she continues to check them at home.  She is doing  well with her medications.  She is off her metformin as her A1c has gotten down to 6.1!!  She is getting better with her breathing. Aeriel is doing well in rehab.  She lost weight while she was sick, but has gained it back some and now plateued.  We talked about continuing to follow her diet and weight will come down again.  Her pressures are doing well and she continues to keep a close eye on them.  Her sugars are steady.  Her breathing has gotten a lot better and she is able to do more before getting SOB.   Expected Outcomes ST: get CT  and f/u with doctor for stomach pain LT: continue to manage risk factors ST: continue to monitor blood sugar and talk with PCP about blood sugars and instability;  LT: continue to manage lifestyle risk factors Short: Continue to build up breathing Long: COnitnue to work on weight loss Short: Continue to work on exercise and strength Long: Continue to work on weight loss Continue to monitor risk factors            Core Components/Risk Factors/Patient Goals at Discharge (Final Review):   Goals and Risk Factor Review - 01/19/22 1149       Core Components/Risk Factors/Patient Goals Review   Personal Goals Review Weight Management/Obesity;Hypertension;Diabetes;Improve shortness of breath with ADL's    Review Srinidhi is doing well in rehab.  She lost weight while she was sick, but has gained it back some and now plateued.  We talked about continuing to follow her diet and weight will come down again.  Her pressures are doing well and she continues to keep a close eye on them.  Her sugars are steady.  Her breathing has gotten a lot better and she is able to do more before getting SOB.    Expected Outcomes Continue to monitor risk factors             ITP Comments:  ITP Comments     Row Name 08/14/21 1127 08/17/21 1700 08/21/21 1015 09/13/21 0922 10/11/21 1355   ITP Comments Virtual Visit completed. Patient informed on EP and RD appointment and 6 Minute walk test.  Patient also informed of patient health questionnaires on My Chart. Patient Verbalizes understanding. Visit diagnosis can be found in [facility] 04/27/2022. Completed 6MWT and gym orientation. Initial ITP created and sent for review to Dr. Ottie Glazier,  Medical Director. First full day of exercise!  Patient was oriented to gym and equipment including functions, settings, policies, and procedures.  Patient's individual exercise prescription and treatment plan were reviewed.  All starting workloads were established based on the results of the 6 minute walk test done at initial orientation visit.  The plan for exercise progression was also introduced and progression will be customized based on patient's performance and goals. 30 Day review completed. Medical Director ITP review done, changes made as directed, and signed approval by Medical Director. 30 Day review completed. Medical Director ITP review done, changes made as directed, and signed approval by Medical Director.    Row Name 11/08/21 1034 12/06/21 0959 12/25/21 1544 12/29/21 0757 01/03/22 1351   ITP Comments 30 Day review completed. Medical Director ITP review done, changes made as directed, and signed approval by Medical Director. 30 Day review completed. Medical Director ITP review done, changes made as directed, and signed approval by Medical Director. Pt has been out sick since 8/16.  She hopes to be able to return to rehab this week. Patient has called out for today. She has not attended since 12/13/2021. Patients attendance has been sporadic due to not feeling well. Will follow up with patient. Stephanye has been out since the last Dr. Sabra Heck day. Goals will be reviewed when patient returns and has been attending regularly. 30 Day review completed. Medical Director ITP review done, changes made as directed, and signed approval by Medical Director.    [place] Name 01/31/22 0802 02/07/22 1339         ITP Comments 30 Day review completed. Medical Director ITP  review done, changes made as directed, and signed approval by Medical Director. Nickisha graduated early today  from  rehab with 33 sessions completed.  Details of the patient's exercise prescription and what She needs to do in order to continue the prescription and progress were discussed with patient.  Patient was given a copy of prescription and goals.  Patient verbalized understanding.  Azaleah plans to continue to exercise by walking.               Comments: Discharge ITP

## 2022-02-08 ENCOUNTER — Encounter: Payer: Medicare Other | Admitting: Psychology

## 2022-02-08 NOTE — Progress Notes (Signed)
This encounter was created in error - please disregard.

## 2022-02-09 ENCOUNTER — Ambulatory Visit: Payer: BC Managed Care – PPO

## 2022-02-12 ENCOUNTER — Ambulatory Visit: Payer: BC Managed Care – PPO

## 2022-02-14 ENCOUNTER — Ambulatory Visit: Payer: BC Managed Care – PPO

## 2022-02-15 ENCOUNTER — Ambulatory Visit (INDEPENDENT_AMBULATORY_CARE_PROVIDER_SITE_OTHER): Payer: Medicare Other | Admitting: Psychology

## 2022-02-15 DIAGNOSIS — F339 Major depressive disorder, recurrent, unspecified: Secondary | ICD-10-CM | POA: Diagnosis not present

## 2022-02-15 NOTE — Progress Notes (Signed)
Brandy [name] Counselor/Therapist Progress Note  Patient ID: Brandy [name], MRN: [id number]    Date: 02/15/22  Time Spent: 12:02 pm - 1::00 pm: 4 Minutes  Treatment Type: Individual Therapy.  Reported Symptoms: Anxiety   Mental Status Exam: Appearance:  Casual     Behavior: Appropriate  Motor: Normal  Speech/Language:  Clear and Coherent and Normal Rate  Affect: Appropriate  Mood: normal  Thought process: normal  Thought content:   WNL  Sensory/Perceptual disturbances:   WNL  Orientation: oriented to person, place, time/date, and situation  Attention: Good  Concentration: Good  Memory: WNL  Fund of knowledge:  Good  Insight:   Good  Judgment:  Good  Impulse Control: Good   Risk Assessment: Danger to Self:  No Self-injurious Behavior: No Danger to Others: No Duty to Warn:no Physical Aggression / Violence:No  Access to Firearms a concern: No  Gang Involvement:No   Subjective:   Brandy [name] participated car, via video, and consented to treatment. Therapist participated from home office. We met online due to [place] pandemic. Brandy [name] reviewed the events of the past week. She noted feeling recently angry and noted this leading to sadness. We explored this during the session. She noted frustration regarding husband's continual interruption when Brandy [name] speaks. We worked on processing this and the effect of this on her mood. We discussed ways to problem-solve this, communicate needs, and promote understanding and support. Brandy [name] noted continued struggles with her daughter in relation to her daughter's lack of boundaries with her daughter. We continued to explore this and boundaries that Brandy [name] would like to set with Brandy [name]. Brandy [name] was engaged and motivated during the session. Brandy [name] noted her commitment towards our session goals. Therapist praised Brandy [name] for her effort and energy and provided supportive therapy.  Brandy [name] continues to benefit from counseling and a follow-up  was scheduled.   Interventions: CBT & Interpersonal  Interventions: Cognitive Behavioral Therapy and Interpersonal   Diagnosis: Episode of recurrent major depressive disorder, unspecified depression episode severity (Brandy [name])  Treatment Plan:  [name], forthcoming, self-aware, and motivated for change.   Support System: Friend Brandy [name]) and Family.   Client Treatment Preferences Outpatient Therapy.  Client Statement of Needs Brandy [name]'s goals for treatment managing her mood and symptoms, processing past events, focus on self-care, set boundaries for self and others, decrease negative self-talk, decrease rumination, process relationship stressors, and improve health.   Treatment Level Weekly  Symptoms  Depressive symptoms: Decreased interest, feeling down, poor sleep, lethargy, psycho-motor retardation, feeling bad about self.  (Status: maintained)  Anxiety: Difficulty managing worry, worrying about different things, restlessness, irritability, afraid something awful might happen  (Status: maintained)  Goals:  Brandy [name] experiences symptoms of depression and anxiety.   Target Date: 2022-06-08 Frequency: Weekly  Progress: 10 Modality: individual    Therapist will provide referrals for additional resources as appropriate.  Therapist will provide psycho-education regarding Brandy [name]'s diagnosis and corresponding treatment approaches and interventions. Licensed Clinical Social Worker, [place], LCSW will support the patient's ability to achieve the goals identified. will employ CBT, BA, Problem-solving, Solution Focused, Mindfulness,  coping skills, & other evidenced-based practices will be used to promote progress towards healthy functioning to help manage decrease symptoms associated with her diagnosis.   Reduce overall level, frequency, and intensity of the feelings of depression and anxiety as evidenced by decreased overall symptoms of depression and  anxiety from 6 to 7 days/week to 0 to 1 days/week per client report for at least  3 consecutive months. Verbally express understanding of the relationship between feelings of depression, anxiety and their impact on thinking patterns and behaviors. Verbalize an understanding of the role that distorted thinking plays in creating fears, excessive worry, and ruminations.  Brandy [name] participated in the creation of the treatment plan)    Brandy Irish, LCSW

## 2022-02-16 ENCOUNTER — Ambulatory Visit: Payer: BC Managed Care – PPO

## 2022-02-19 ENCOUNTER — Ambulatory Visit: Payer: BC Managed Care – PPO

## 2022-02-21 ENCOUNTER — Ambulatory Visit: Payer: BC Managed Care – PPO

## 2022-02-22 ENCOUNTER — Ambulatory Visit (INDEPENDENT_AMBULATORY_CARE_PROVIDER_SITE_OTHER): Payer: Medicare Other | Admitting: Psychology

## 2022-02-22 DIAGNOSIS — F339 Major depressive disorder, recurrent, unspecified: Secondary | ICD-10-CM | POA: Diagnosis not present

## 2022-02-22 DIAGNOSIS — F3289 Other specified depressive episodes: Secondary | ICD-10-CM

## 2022-02-22 NOTE — Progress Notes (Signed)
[facility] Counselor/Therapist Progress Note  Patient ID: Brandy [name], MRN: [id number]    Date: 02/22/22  Time Spent: 12:04 pm - 12:59 pm: 55 Minutes  Treatment Type: Individual Therapy.  Reported Symptoms: Anxiety   Mental Status Exam: Appearance:  Casual     Behavior: Appropriate  Motor: Normal  Speech/Language:  Clear and Coherent and Normal Rate  Affect: Appropriate  Mood: normal  Thought process: normal  Thought content:   WNL  Sensory/Perceptual disturbances:   WNL  Orientation: oriented to person, place, time/date, and situation  Attention: Good  Concentration: Good  Memory: WNL  Fund of knowledge:  Good  Insight:   Good  Judgment:  Good  Impulse Control: Good   Risk Assessment: Danger to Self:  No Self-injurious Behavior: No Danger to Others: No Duty to Warn:no Physical Aggression / Violence:No  Access to Firearms a concern: No  Gang Involvement:No   Subjective:   Brandy [name] participated car, via video, and consented to treatment. Therapist participated from home office. We met online due to [place] pandemic. Brandy [name] reviewed the events of the past week. She noted difficulty waking early in the morning. She noted her effort to get to sleep earlier. She discussed varying perspectives regarding spending and finances that have been a source of contention in the past. We explored this during the session and discussed frustration regarding this arrangement. She noted worry about her son's unaddressed ADHD and noted a need to have this addressed. Psycho-education regarding ADHD was provided during the session. Therapist praised Brandy [name] for her effort to give her daughter feedback and set boundaries. Therapist validated and normalized Brandy [name]'s feelings and experience and provided supportive therapy. Brandy [name] continues to benefit from counseling and a follow-up was scheduled.   Interventions: Interpersonal  Interventions: Cognitive Behavioral Therapy  and Interpersonal   Diagnosis: Episode of recurrent major depressive disorder, unspecified depression episode severity (Brandy [name])  Other depression  Treatment Plan:  [name], forthcoming, self-aware, and motivated for change.   Support System: Friend Brandy [name]) and Family.   Client Treatment Preferences Outpatient Therapy.  Client Statement of Needs Brandy [name]'s goals for treatment managing her mood and symptoms, processing past events, focus on self-care, set boundaries for self and others, decrease negative self-talk, decrease rumination, process relationship stressors, and improve health.   Treatment Level Weekly  Symptoms  Depressive symptoms: Decreased interest, feeling down, poor sleep, lethargy, psycho-motor retardation, feeling bad about self.  (Status: maintained)  Anxiety: Difficulty managing worry, worrying about different things, restlessness, irritability, afraid something awful might happen  (Status: maintained)  Goals:  Brandy [name] experiences symptoms of depression and anxiety.   Target Date: 2022-06-08 Frequency: Weekly  Progress: 10 Modality: individual    Therapist will provide referrals for additional resources as appropriate.  Therapist will provide psycho-education regarding Brandy [name]'s diagnosis and corresponding treatment approaches and interventions. Licensed Clinical Social Worker, [place], LCSW will support the patient's ability to achieve the goals identified. will employ CBT, BA, Problem-solving, Solution Focused, Mindfulness,  coping skills, & other evidenced-based practices will be used to promote progress towards healthy functioning to help manage decrease symptoms associated with her diagnosis.   Reduce overall level, frequency, and intensity of the feelings of depression and anxiety as evidenced by decreased overall symptoms of depression and anxiety from 6 to 7 days/week to 0 to 1 days/week per client report for at least 3  consecutive months. Verbally express understanding of the relationship between feelings of depression, anxiety and their impact  on thinking patterns and behaviors. Verbalize an understanding of the role that distorted thinking plays in creating fears, excessive worry, and ruminations.  Brandy [name] participated in the creation of the treatment plan)    [name], LCSW

## 2022-02-23 ENCOUNTER — Ambulatory Visit: Payer: BC Managed Care – PPO

## 2022-03-01 ENCOUNTER — Ambulatory Visit: Payer: BC Managed Care – PPO | Admitting: Psychology

## 2022-03-02 ENCOUNTER — Encounter: Payer: Self-pay | Admitting: Dietician

## 2022-03-02 NOTE — Progress Notes (Signed)
Have not heard back from patient to reschedule her missed appointment from 01/05/22. Sent notification to referring provider.

## 2022-03-08 ENCOUNTER — Ambulatory Visit (INDEPENDENT_AMBULATORY_CARE_PROVIDER_SITE_OTHER): Payer: Medicare Other | Admitting: Psychology

## 2022-03-08 DIAGNOSIS — F339 Major depressive disorder, recurrent, unspecified: Secondary | ICD-10-CM | POA: Diagnosis not present

## 2022-03-08 NOTE — Progress Notes (Signed)
[facility] Counselor/Therapist Progress Note  Patient ID: Brandy [name], MRN: [id number]    Date: 03/08/22  Time Spent: 12:09 pm - 1:01 pm: 53 Minutes  Treatment Type: Individual Therapy.  Reported Symptoms: Anxiety   Mental Status Exam: Appearance:  Casual     Behavior: Appropriate  Motor: Normal  Speech/Language:  Clear and Coherent and Normal Rate  Affect: Appropriate  Mood: normal  Thought process: normal  Thought content:   WNL  Sensory/Perceptual disturbances:   WNL  Orientation: oriented to person, place, time/date, and situation  Attention: Good  Concentration: Good  Memory: WNL  Fund of knowledge:  Good  Insight:   Good  Judgment:  Good  Impulse Control: Good   Risk Assessment: Danger to Self:  No Self-injurious Behavior: No Danger to Others: No Duty to Warn:no Physical Aggression / Violence:No  Access to Firearms a concern: No  Gang Involvement:No   Subjective:   Brandy [name] participated car, via video, and consented to treatment. Therapist participated from home office. We met online due to [place] pandemic. Brandy [name] reviewed the events of the past week. She noted getting a long well, overall, with her daughter but noted that her daughter's mood is often predicated by her stress level. Brandy [name] noted her daughter's parental style and difficulty setting boundaries with her daughter. She noted Brandy [name]'s attempt to offload her own parenting decisions and discipline onto Brandy [name] and Brandy [name]'s attempt to set boundaries with Brandy [name]. We continued to explore this during the session and ways to communicate without engagement. Therapist modeled this during the session. She note cooking more and noted this possibly developing into a hobby. Therapist praised Brandy [name] for her efforts between sessions. She noted experiencing negative self-talk regarding her parenting. She noted feeling like a "Shitty parent" in comparison to her friend's daughter and her parenting. We  will explore this during the follow-up session. Therapist validated and normalized Brandy [name]'s feelings and experience and provided supportive therapy. Brandy [name] continues to benefit from counseling and a follow-up was scheduled.   Interventions: Interpersonal  Interventions: Cognitive Behavioral Therapy and Interpersonal   Diagnosis: Episode of recurrent major depressive disorder, unspecified depression episode severity ([place])  Treatment Plan:  [name], forthcoming, self-aware, and motivated for change.   Support System: Friend Brandy [name]) and Family.   Client Treatment Preferences Outpatient Therapy.  Client Statement of Needs Brandy [name]'s goals for treatment managing her mood and symptoms, processing past events, focus on self-care, set boundaries for self and others, decrease negative self-talk, decrease rumination, process relationship stressors, and improve health.   Treatment Level Weekly  Symptoms  Depressive symptoms: Decreased interest, feeling down, poor sleep, lethargy, psycho-motor retardation, feeling bad about self.  (Status: maintained)  Anxiety: Difficulty managing worry, worrying about different things, restlessness, irritability, afraid something awful might happen  (Status: maintained)  Goals:  Brandy [name] experiences symptoms of depression and anxiety.   Target Date: 2022-06-08 Frequency: Weekly  Progress: 10 Modality: individual    Therapist will provide referrals for additional resources as appropriate.  Therapist will provide psycho-education regarding Brandy [name]'s diagnosis and corresponding treatment approaches and interventions. Licensed Clinical Social Worker, [place], LCSW will support the patient's ability to achieve the goals identified. will employ CBT, BA, Problem-solving, Solution Focused, Mindfulness,  coping skills, & other evidenced-based practices will be used to promote progress towards healthy functioning to help manage  decrease symptoms associated with her diagnosis.   Reduce overall level, frequency, and intensity of the feelings of depression and anxiety as  evidenced by decreased overall symptoms of depression and anxiety from 6 to 7 days/week to 0 to 1 days/week per client report for at least 3 consecutive months. Verbally express understanding of the relationship between feelings of depression, anxiety and their impact on thinking patterns and behaviors. Verbalize an understanding of the role that distorted thinking plays in creating fears, excessive worry, and ruminations.  Brandy [name] participated in the creation of the treatment plan)    [name], LCSW

## 2022-03-15 ENCOUNTER — Ambulatory Visit (INDEPENDENT_AMBULATORY_CARE_PROVIDER_SITE_OTHER): Payer: Medicare Other | Admitting: Psychology

## 2022-03-15 DIAGNOSIS — F339 Major depressive disorder, recurrent, unspecified: Secondary | ICD-10-CM | POA: Diagnosis not present

## 2022-03-15 NOTE — Progress Notes (Signed)
[place] [facility] Counselor/Therapist Progress Note  Patient ID: SHAHIRA FISKE, MRN: [id number]    Date: 03/15/22  Time Spent: 12:03 pm - 12:30 pm: 27 Minutes  Treatment Type: Individual Therapy.  Reported Symptoms: Anxiety   Mental Status Exam: Appearance:  Casual     Behavior: Appropriate  Motor: Normal  Speech/Language:  Clear and Coherent and Normal Rate  Affect: Appropriate  Mood: normal  Thought process: normal  Thought content:   WNL  Sensory/Perceptual disturbances:   WNL  Orientation: oriented to person, place, time/date, and situation  Attention: Good  Concentration: Good  Memory: WNL  Fund of knowledge:  Good  Insight:   Good  Judgment:  Good  Impulse Control: Good   Risk Assessment: Danger to Self:  No Self-injurious Behavior: No Danger to Others: No Duty to Warn:no Physical Aggression / Violence:No  Access to Firearms a concern: No  Gang Involvement:No   Subjective:   Margan Elias Skelly participated car, via video, and consented to treatment. Therapist participated from home office. We met online due to [place] pandemic. Charlette Hennings Dolliver reviewed the events of the past week. She noted improvement in her son Tyler's functioning. She noted working on improving her frustration tolerance and mindfulness when stressed during interpersonal strain. She noted having to continue to be mindful of the boundaries she has with her daughter. She noted continued effort towards enjoyable activities including cooking. She noted continued to work on eating healthfully and sleeping less.  Sotiria ended the session early due to a family emergency.  We will continue with sessions following up in 2 weeks.  Therapist validated and normalized motive experience and provided supportive therapy.   Interventions: Interpersonal  Interventions: Cognitive Behavioral Therapy and Interpersonal   Diagnosis: Episode of recurrent major depressive disorder, unspecified depression episode  severity ([place])  Treatment Plan:  Client Abilities/Strengths Kariss is intelligent, forthcoming, self-aware, and motivated for change.   Support System: Friend [name] and Family.   Client Treatment Preferences Outpatient Therapy.  Client Statement of Needs Thedora's goals for treatment managing her mood and symptoms, processing past events, focus on self-care, set boundaries for self and others, decrease negative self-talk, decrease rumination, process relationship stressors, and improve health.   Treatment Level Weekly  Symptoms  Depressive symptoms: Decreased interest, feeling down, poor sleep, lethargy, psycho-motor retardation, feeling bad about self.  (Status: maintained)  Anxiety: Difficulty managing worry, worrying about different things, restlessness, irritability, afraid something awful might happen  (Status: maintained)  Goals:  Kaelynn experiences symptoms of depression and anxiety.   Target Date: 2022-06-08 Frequency: Weekly  Progress: 10 Modality: individual    Therapist will provide referrals for additional resources as appropriate.  Therapist will provide psycho-education regarding Delaynie's diagnosis and corresponding treatment approaches and interventions. Licensed Clinical Social Worker, [place], LCSW will support the patient's ability to achieve the goals identified. will employ CBT, BA, Problem-solving, Solution Focused, Mindfulness,  coping skills, & other evidenced-based practices will be used to promote progress towards healthy functioning to help manage decrease symptoms associated with her diagnosis.   Reduce overall level, frequency, and intensity of the feelings of depression and anxiety as evidenced by decreased overall symptoms of depression and anxiety from 6 to 7 days/week to 0 to 1 days/week per client report for at least 3 consecutive months. Verbally express understanding of the relationship between feelings of depression, anxiety and their impact on  thinking patterns and behaviors. Verbalize an understanding of the role that distorted thinking plays in creating fears, excessive  worry, and ruminations.  [name] participated in the creation of the treatment plan)    [name], LCSW

## 2022-03-29 ENCOUNTER — Ambulatory Visit (INDEPENDENT_AMBULATORY_CARE_PROVIDER_SITE_OTHER): Payer: Medicare Other | Admitting: Psychology

## 2022-03-29 DIAGNOSIS — F339 Major depressive disorder, recurrent, unspecified: Secondary | ICD-10-CM

## 2022-03-29 NOTE — Progress Notes (Signed)
[facility] Counselor/Therapist Progress Note  Patient ID: Brandy [name], MRN: [id number]    Date: 03/29/22  Time Spent: 12:10 pm -  12:50 pm: 40 Minutes  Treatment Type: Individual Therapy.  Reported Symptoms: Anxiety   Mental Status Exam: Appearance:  na      Behavior: Appropriate  Motor: na  Speech/Language:  Clear and Coherent and Normal Rate  Affect: Appropriate  Mood: normal  Thought process: normal  Thought content:   WNL  Sensory/Perceptual disturbances:   WNL  Orientation: oriented to person, place, time/date, and situation  Attention: Good  Concentration: Good  Memory: WNL  Fund of knowledge:  Good  Insight:   Good  Judgment:  Good  Impulse Control: Good   Risk Assessment: Danger to Self:  No Self-injurious Behavior: No Danger to Others: No Duty to Warn:no Physical Aggression / Violence:No  Access to Firearms a concern: No  Gang Involvement:No   Subjective:   Brandy [name] Lean participated car, via phone, and consented to treatment. Therapist participated from home office. We met online due to [place] pandemic. Brandy [name] reviewed the events of the past week. Brandy [name] noted continued worry about her grand daughter and a need for her to address her academics and mood. She noted difficulty communicating with her daughter and noted her daughter's reactive nature. We worked on communicating assertively, setting boundaries, maintaining boundaries, and disengaging from unhealthy interactions. Therapist modeled this during the session and [name] engaged in role-playing ways to manage stressors. Therapist validated and normalized [name]'s feelings and experience. Therapist provided supportive therapy.   Interventions: Interpersonal  Interventions: Cognitive Behavioral Therapy and Interpersonal   Diagnosis: Episode of recurrent major depressive disorder, unspecified depression episode severity (Brandy [name])  Treatment Plan:  Client Abilities/Strengths Brandy [name] is  intelligent, forthcoming, self-aware, and motivated for change.   Support System: Friend Vicente [name]) and Family.   Client Treatment Preferences Outpatient Therapy.  Client Statement of Needs Brandy [name] goals for treatment managing her mood and symptoms, processing past events, focus on self-care, set boundaries for self and others, decrease negative self-talk, decrease rumination, process relationship stressors, and improve health.   Treatment Level Weekly  Symptoms  Depressive symptoms: Decreased interest, feeling down, poor sleep, lethargy, psycho-motor retardation, feeling bad about self.  (Status: maintained)  Anxiety: Difficulty managing worry, worrying about different things, restlessness, irritability, afraid something awful might happen  (Status: maintained)  Goals:  [name] experiences symptoms of depression and anxiety.   Target Date: 2022-06-08 Frequency: Weekly  Progress: 10 Modality: individual    Therapist will provide referrals for additional resources as appropriate.  Therapist will provide psycho-education regarding [name]'s diagnosis and corresponding treatment approaches and interventions. Licensed Clinical Social Worker, [place], LCSW will support the patient's ability to achieve the goals identified. will employ CBT, BA, Problem-solving, Solution Focused, Mindfulness,  coping skills, & other evidenced-based practices will be used to promote progress towards healthy functioning to help manage decrease symptoms associated with her diagnosis.   Reduce overall level, frequency, and intensity of the feelings of depression and anxiety as evidenced by decreased overall symptoms of depression and anxiety from 6 to 7 days/week to 0 to 1 days/week per client report for at least 3 consecutive months. Verbally express understanding of the relationship between feelings of depression, anxiety and their impact on thinking patterns and behaviors. Verbalize an understanding of the role  that distorted thinking plays in creating fears, excessive worry, and ruminations.  Brandy [name] participated in the creation of the treatment plan)    Brandy [name]  Brandy Hickam, LCSW

## 2022-04-05 ENCOUNTER — Ambulatory Visit (INDEPENDENT_AMBULATORY_CARE_PROVIDER_SITE_OTHER): Payer: Medicare Other | Admitting: Psychology

## 2022-04-05 DIAGNOSIS — F339 Major depressive disorder, recurrent, unspecified: Secondary | ICD-10-CM | POA: Diagnosis not present

## 2022-04-05 NOTE — Progress Notes (Signed)
[facility] Counselor/Therapist Progress Note  Patient ID: Brandy [name], MRN: [id number]    Date: 04/05/22  Time Spent: 12:03 pm -  12:31 pm:28 Minutes  Treatment Type: Individual Therapy.  Reported Symptoms: Anxiety   Mental Status Exam: Appearance:  na      Behavior: Appropriate  Motor: na  Speech/Language:  Clear and Coherent and Normal Rate  Affect: Appropriate  Mood: normal  Thought process: normal  Thought content:   WNL  Sensory/Perceptual disturbances:   WNL  Orientation: oriented to person, place, time/date, and situation  Attention: Good  Concentration: Good  Memory: WNL  Fund of knowledge:  Good  Insight:   Good  Judgment:  Good  Impulse Control: Good   Risk Assessment: Danger to Self:  No Self-injurious Behavior: No Danger to Others: No Duty to Warn:no Physical Aggression / Violence:No  Access to Firearms a concern: No  Gang Involvement:No   Subjective:   Brandy [name] participated car, via phone, and consented to treatment. Therapist participated from home office. We met online due to [place] pandemic. Brandy [name] reviewed the events of the past week. Brandy [name] noted having a gym membership but has not worked out in some-time and noted feeling negatively about that. She noted the barriers to being able to get to the gym including having supervision for her son, Brandy [name]. We engaged in problem-solving to address aforementioned barriers. Brandy [name] noted her eating being "fair" overall and noted her intent to work in this area after the holiday while she currently maintains her weight. She noted a fluctuating wake schedule and fluctuating sleep schedule, as well. We discussed ways to be more purposeful and deliberate with her planning. We employed principles of BA to create a schedule, prepare, and plan ahead. We continued to process her relationship with her daughter and a need for boundaries. Therapist validated and normalized Brandy [name]'s feelings and  experience. Therapist provided supportive therapy.   Interventions: Interpersonal  Interventions: Cognitive Behavioral Therapy and Interpersonal   Diagnosis: Episode of recurrent major depressive disorder, unspecified depression episode severity ([place])  Treatment Plan:  [name], forthcoming, self-aware, and motivated for change.   Support System: Friend Brandy [name]) and Family.   Client Treatment Preferences Outpatient Therapy.  Client Statement of Needs Brandy [name]'s goals for treatment managing her mood and symptoms, processing past events, focus on self-care, set boundaries for self and others, decrease negative self-talk, decrease rumination, process relationship stressors, and improve health.   Treatment Level Weekly  Symptoms  Depressive symptoms: Decreased interest, feeling down, poor sleep, lethargy, psycho-motor retardation, feeling bad about self.  (Status: maintained)  Anxiety: Difficulty managing worry, worrying about different things, restlessness, irritability, afraid something awful might happen  (Status: maintained)  Goals:  Brandy [name] experiences symptoms of depression and anxiety.   Target Date: 2022-06-08 Frequency: Weekly  Progress: 10 Modality: individual    Therapist will provide referrals for additional resources as appropriate.  Therapist will provide psycho-education regarding Brandy [name]'s diagnosis and corresponding treatment approaches and interventions. Licensed Clinical Social Worker, [place], LCSW will support the patient's ability to achieve the goals identified. will employ CBT, BA, Problem-solving, Solution Focused, Mindfulness,  coping skills, & other evidenced-based practices will be used to promote progress towards healthy functioning to help manage decrease symptoms associated with her diagnosis.   Reduce overall level, frequency, and intensity of the feelings of depression and anxiety as evidenced by decreased overall  symptoms of depression and anxiety from 6 to 7 days/week to 0 to 1  days/week per client report for at least 3 consecutive months. Verbally express understanding of the relationship between feelings of depression, anxiety and their impact on thinking patterns and behaviors. Verbalize an understanding of the role that distorted thinking plays in creating fears, excessive worry, and ruminations.  Brandy [name] participated in the creation of the treatment plan)    [name], LCSW

## 2022-04-12 ENCOUNTER — Ambulatory Visit: Payer: Medicare Other | Admitting: Psychology

## 2022-04-19 ENCOUNTER — Ambulatory Visit (INDEPENDENT_AMBULATORY_CARE_PROVIDER_SITE_OTHER): Payer: Medicare Other | Admitting: Psychology

## 2022-04-19 DIAGNOSIS — F339 Major depressive disorder, recurrent, unspecified: Secondary | ICD-10-CM | POA: Diagnosis not present

## 2022-04-19 NOTE — Progress Notes (Signed)
[facility] Counselor/Therapist Progress Note  Patient ID: SYMIA HERDT, MRN: [id number]    Date: 04/19/22  Time Spent: 12:02 pm -  1:01 pm:  59 Minutes  Treatment Type: Individual Therapy.  Reported Symptoms: Anxiety   Mental Status Exam: Appearance:  na      Behavior: Appropriate  Motor: na  Speech/Language:  Clear and Coherent and Normal Rate  Affect: Appropriate  Mood: normal  Thought process: normal  Thought content:   WNL  Sensory/Perceptual disturbances:   WNL  Orientation: oriented to person, place, time/date, and situation  Attention: Good  Concentration: Good  Memory: WNL  Fund of knowledge:  Good  Insight:   Good  Judgment:  Good  Impulse Control: Good   Risk Assessment: Danger to Self:  No Self-injurious Behavior: No Danger to Others: No Duty to Warn:no Physical Aggression / Violence:No  Access to Firearms a concern: No  Gang Involvement:No   Subjective:   Juanda Luba Villela participated home, via phone, and consented to treatment. Therapist participated from home office. We met online due to [place] pandemic. Lakelynn Severtson [place] reviewed the events of the past week. She expressed gratitude regarding her improved mobility in comparison to previous years. She noted frustrations regarding her husband's lack of engagement in a conversation about relationship goals. She noted on working on acceptance of Ed's lack of interest in specific areas. We worked on building flexibility in this area and the use of communication. She noted sadness and frustration regarding unmet relationship needs. We continued to process this during the session and ways to communicate needs and work from where they both are. She noted difficulty with getting restful sleep for a long period of time and noted night-time rumination (random thoughts and worry) affecting her ability to fall asleep. She noted distraction being ineffectual in addressing her rumination. She noted her previous  interventions, creation of a list, to address concerns the following day. Therapist encouraged Isabella to take an inventory of her sleep concerns and provided psycho-education regarding sleep hygiene along with providing an email with the resources for reference and review. Therapist encouraged Dayshia to start writing down her thoughts and worries to be later addressed. Therapist modeled this along with corresponding self-talk. Fatiha was engaged and motivated, during the session, and expressed commitment towards our goals. We will discuss use of social and ways to modulate it.  Therapist validated and normalized Marijose's feeling and experience and provided supportive therapy.   Interventions: Interpersonal  Interventions: Cognitive Behavioral Therapy and Interpersonal   Diagnosis: Episode of recurrent major depressive disorder, unspecified depression episode severity ([place]iguel Bar[place])  Treatment Plan:  Client Abilities/Strengths Rechelle is intelligent, forthcoming, self-aware, and motivated for change.   Support System: Friend [name] and Family.   Client Treatment Preferences Outpatient Therapy.  Client Statement of Needs Bora's goals for treatment managing her mood and symptoms, processing past events, focus on self-care, set boundaries for self and others, decrease negative self-talk, decrease rumination, process relationship stressors, and improve health.   Treatment Level Weekly  Symptoms  Depressive symptoms: Decreased interest, feeling down, poor sleep, lethargy, psycho-motor retardation, feeling bad about self.  (Status: maintained)  Anxiety: Difficulty managing worry, worrying about different things, restlessness, irritability, afraid something awful might happen  (Status: maintained)  Goals:  Karenna experiences symptoms of depression and anxiety.   Target Date: 2022-06-08 Frequency: Weekly  Progress: 10 Modality: individual    Therapist will provide referrals for additional resources as  appropriate.  Therapist will provide psycho-education regarding Cedar's  diagnosis and corresponding treatment approaches and interventions. Licensed Clinical Social Worker, [place], LCSW will support the patient's ability to achieve the goals identified. will employ CBT, BA, Problem-solving, Solution Focused, Mindfulness,  coping skills, & other evidenced-based practices will be used to promote progress towards healthy functioning to help manage decrease symptoms associated with her diagnosis.   Reduce overall level, frequency, and intensity of the feelings of depression and anxiety as evidenced by decreased overall symptoms of depression and anxiety from 6 to 7 days/week to 0 to 1 days/week per client report for at least 3 consecutive months. Verbally express understanding of the relationship between feelings of depression, anxiety and their impact on thinking patterns and behaviors. Verbalize an understanding of the role that distorted thinking plays in creating fears, excessive worry, and ruminations.  [name] participated in the creation of the treatment plan)    [name], LCSW

## 2022-04-26 ENCOUNTER — Ambulatory Visit: Payer: BC Managed Care – PPO | Admitting: Psychology

## 2022-05-03 ENCOUNTER — Ambulatory Visit: Payer: Medicare Other | Admitting: Psychology

## 2022-05-04 ENCOUNTER — Other Ambulatory Visit (HOSPITAL_COMMUNITY): Payer: Self-pay

## 2022-05-10 ENCOUNTER — Ambulatory Visit (INDEPENDENT_AMBULATORY_CARE_PROVIDER_SITE_OTHER): Payer: Self-pay | Admitting: Psychology

## 2022-05-10 DIAGNOSIS — F339 Major depressive disorder, recurrent, unspecified: Secondary | ICD-10-CM

## 2022-05-10 DIAGNOSIS — F3289 Other specified depressive episodes: Secondary | ICD-10-CM

## 2022-05-10 NOTE — Progress Notes (Signed)
[facility] Counselor/Therapist Progress Note  Patient ID: Brandy [name], MRN: [id number]    Date: 05/10/22  Time Spent: 12:06 pm -  12:57 pm:  51 Minutes  Treatment Type: Individual Therapy.  Reported Symptoms: Anxiety   Mental Status Exam: Appearance:  na      Behavior: Appropriate  Motor: na  Speech/Language:  Clear and Coherent and Normal Rate  Affect: Appropriate  Mood: normal  Thought process: normal  Thought content:   WNL  Sensory/Perceptual disturbances:   WNL  Orientation: oriented to person, place, time/date, and situation  Attention: Good  Concentration: Good  Memory: WNL  Fund of knowledge:  Good  Insight:   Good  Judgment:  Good  Impulse Control: Good   Risk Assessment: Danger to Self:  No Self-injurious Behavior: No Danger to Others: No Duty to Warn:no Physical Aggression / Violence:No  Access to Firearms a concern: No  Gang Involvement:No   Subjective:   Brandy [name] participated car, via video, and consented to treatment. Therapist participated from home office. We met online due to [place] pandemic. Brandy [name] reviewed the events of the past week. Brandy [name] noted missing the previous appointment due to a COVID diagnosis. She noted her Christmas celebrations going well overall.  She noted continued night-time rumination and noted having random thoughts that do not connect to one another. She noted this often occurring conversationally including unconnected rapid thoughts and speech and described it as "rambling" and noted interrupting others. She endorsed fidgeting and noted her fidgeting during the session. She noted being a good student but noted often being a Loss adjuster, chartered" at school. Therapist provided Brandy [name] an handout, the ASRS V1.1, to be completed between session. Psycho-education was provided regard the purpose of the tool and requested that she submit the measure prior to our follow-up. We worked on identifying activities she wants to  engage in including working out, painting, [place], going on a cruise, engaging in Barrister's [employer] for varies events. She noted working on budgeting with Ed more consistently, on a scheduled manner, and noted these discussions going well, overall. We discussed the importance of creating short-term and long-term goals, consistent check-ins with documentation, and finding varying ways to measure progress. Brandy [name] will work on creating a list of priorities and delineating the order of importance. Therapist praised Gurpreet's energy and effort during the session and commitment towards goals. Therapist validated and normalized [name]'s feeling and experience and provided supportive therapy.   Interventions: Interpersonal & BA  Interventions: Cognitive Behavioral Therapy and Interpersonal   Diagnosis: Episode of recurrent major depressive disorder, unspecified depression episode severity ([place])  Other depression  Treatment Plan:  [name], forthcoming, self-aware, and motivated for change.   Support System: Friend Brandy [name]) and Family.   Client Treatment Preferences Outpatient Therapy.  Client Statement of Needs Brandy [name]'s goals for treatment managing her mood and symptoms, processing past events, focus on self-care, set boundaries for self and others, decrease negative self-talk, decrease rumination, process relationship stressors, and improve health.   Treatment Level Weekly  Symptoms  Depressive symptoms: Decreased interest, feeling down, poor sleep, lethargy, psycho-motor retardation, feeling bad about self.  (Status: maintained)  Anxiety: Difficulty managing worry, worrying about different things, restlessness, irritability, afraid something awful might happen  (Status: maintained)  Goals:  Brandy [name] experiences symptoms of depression and anxiety.   Target Date: 2022-06-08 Frequency: Weekly  Progress: 10 Modality: individual    Therapist will provide referrals for  additional resources as appropriate.  Therapist will provide  psycho-education regarding Brandy [name]'s diagnosis and corresponding treatment approaches and interventions. Licensed Clinical Social Worker, [place], LCSW will support the patient's ability to achieve the goals identified. will employ CBT, BA, Problem-solving, Solution Focused, Mindfulness,  coping skills, & other evidenced-based practices will be used to promote progress towards healthy functioning to help manage decrease symptoms associated with her diagnosis.   Reduce overall level, frequency, and intensity of the feelings of depression and anxiety as evidenced by decreased overall symptoms of depression and anxiety from 6 to 7 days/week to 0 to 1 days/week per client report for at least 3 consecutive months. Verbally express understanding of the relationship between feelings of depression, anxiety and their impact on thinking patterns and behaviors. Verbalize an understanding of the role that distorted thinking plays in creating fears, excessive worry, and ruminations.  Brandy [name] participated in the creation of the treatment plan)   [name], LCSW

## 2022-05-17 ENCOUNTER — Ambulatory Visit (INDEPENDENT_AMBULATORY_CARE_PROVIDER_SITE_OTHER): Payer: Self-pay | Admitting: Psychology

## 2022-05-17 DIAGNOSIS — F339 Major depressive disorder, recurrent, unspecified: Secondary | ICD-10-CM

## 2022-05-17 NOTE — Progress Notes (Signed)
[facility] Counselor/Therapist Progress Note  Patient ID: IRASEMA CHALK, MRN: [id number]    Date: 05/17/22  Time Spent: 12:06 pm -  1:02 pm:  56 Minutes  Treatment Type: Individual Therapy.  Reported Symptoms: Anxiety   Mental Status Exam: Appearance:  na      Behavior: Appropriate  Motor: Normal  Speech/Language:  Clear and Coherent and Normal Rate  Affect: Appropriate  Mood: normal  Thought process: normal  Thought content:   WNL  Sensory/Perceptual disturbances:   WNL  Orientation: oriented to person, place, time/date, and situation  Attention: Good  Concentration: Good  Memory: WNL  Fund of knowledge:  Good  Insight:   Good  Judgment:  Good  Impulse Control: Good   Risk Assessment: Danger to Self:  No Self-injurious Behavior: No Danger to Others: No Duty to Warn:no Physical Aggression / Violence:No  Access to Firearms a concern: No  Gang Involvement:No   Subjective:   Durene Dodge Meland participated home, via video, and consented to treatment. Therapist participated from home office. We met online due to [place] pandemic. Sadee Osland Anglada reviewed the events of the past week. Jaysie noted frustrations regarding her phone not working well and her attempts to problem solving. She noted not doing the ADHD screening tool between sessions but noted identifying with many of the survey questions. She noted a rise of many of the symptoms but could not recall the source of this. She noted this bothering her. She noted an improvement in sleep during the majority of the past week. She noted continued effort to limit her social media use which can get in the way of sleep and other priorities. Therapist provided psycho-education regarding the effects of excessive phone and social media use. She noted interest in learning ASL and discussed the pursuit of healthy forms of stimulation. She noted struggles with self-care and house care and discussed consistency being difficult.  She noted difficulty balancing the wants and needs. We worked on identifying ways to create balance and consistency. Therapist challenged Jahni's dichotomous thinking in relation to self-care and household chores and introduced a more flexible and balanced approach. We discussed the importance of using reinforcers. We will pursue this going forward, in follow-up session. Therapist praised Valincia's energy and effort during the session.   Interventions: Interpersonal & BA  Interventions: Cognitive Behavioral Therapy and Interpersonal   Diagnosis: Episode of recurrent major depressive disorder, unspecified depression episode severity ([place])  Treatment Plan:  Client Abilities/Strengths Nakima is intelligent, forthcoming, self-aware, and motivated for change.   Support System: Friend [name] and Family.   Client Treatment Preferences Outpatient Therapy.  Client Statement of Needs Avayah's goals for treatment managing her mood and symptoms, processing past events, focus on self-care, set boundaries for self and others, decrease negative self-talk, decrease rumination, process relationship stressors, and improve health.   Treatment Level Weekly  Symptoms  Depressive symptoms: Decreased interest, feeling down, poor sleep, lethargy, psycho-motor retardation, feeling bad about self.  (Status: maintained)  Anxiety: Difficulty managing worry, worrying about different things, restlessness, irritability, afraid something awful might happen  (Status: maintained)  Goals:  Sangeeta experiences symptoms of depression and anxiety.   Target Date: 2022-06-08 Frequency: Weekly  Progress: 10 Modality: individual    Therapist will provide referrals for additional resources as appropriate.  Therapist will provide psycho-education regarding Artemisa's diagnosis and corresponding treatment approaches and interventions. Licensed Clinical Social Worker, [name], LCSW will support the patient's ability to achieve  the goals identified. will employ CBT,  BA, Problem-solving, Solution Focused, Mindfulness,  coping skills, & other evidenced-based practices will be used to promote progress towards healthy functioning to help manage decrease symptoms associated with her diagnosis.   Reduce overall level, frequency, and intensity of the feelings of depression and anxiety as evidenced by decreased overall symptoms of depression and anxiety from 6 to 7 days/week to 0 to 1 days/week per client report for at least 3 consecutive months. Verbally express understanding of the relationship between feelings of depression, anxiety and their impact on thinking patterns and behaviors. Verbalize an understanding of the role that distorted thinking plays in creating fears, excessive worry, and ruminations.  [name] participated in the creation of the treatment plan)   [name], LCSW

## 2022-05-24 ENCOUNTER — Ambulatory Visit (INDEPENDENT_AMBULATORY_CARE_PROVIDER_SITE_OTHER): Payer: PPO | Admitting: Psychology

## 2022-05-24 DIAGNOSIS — F339 Major depressive disorder, recurrent, unspecified: Secondary | ICD-10-CM

## 2022-05-24 NOTE — Progress Notes (Signed)
[facility] Counselor/Therapist Progress Note  Patient ID: Brandy [name], MRN: [id number]    Date: 05/24/22  Time Spent: 12:06 pm -  1:00 pm:  54 Minutes  Treatment Type: Individual Therapy.  Reported Symptoms: Anxiety   Mental Status Exam: Appearance:  na      Behavior: Appropriate  Motor: Normal  Speech/Language:  Clear and Coherent and Normal Rate  Affect: Appropriate  Mood: normal  Thought process: normal  Thought content:   WNL  Sensory/Perceptual disturbances:   WNL  Orientation: oriented to person, place, time/date, and situation  Attention: Good  Concentration: Good  Memory: WNL  Fund of knowledge:  Good  Insight:   Good  Judgment:  Good  Impulse Control: Good   Risk Assessment: Danger to Self:  No Self-injurious Behavior: No Danger to Others: No Duty to Warn:no Physical Aggression / Violence:No  Access to Firearms a concern: No  Gang Involvement:No   Subjective:   Brandy [name] participated home, via phone, and consented to treatment. Therapist participated from home office. We met online due to Brandy [name]. Brandy [name] reviewed the events of the past week. We reviewed the results of the ASRS V1.1 that Brandy [name] submitted via email. Brandy [name] had a positive screening for ADHD and was provided psycho-education regarding ADHD, diagnostics, the need for differential diagnosis, and was advised to communicate the screening results with her psychiatrist, Dr. Cephus Shelling. She noted creating a plan for creating balance in her self-care, task completion, and day-to-day engagement. We reviewed her plans and worked on applying the principles of BA to create reasonable goals, set reasonable expectations, prepare , creating a schedule, and identifying barriers. We discussed the importance of setting goals and being mindful of self-talk in relation to this process. Therapist provided psycho-education regarding behavioral activation. Therapist praised Brandy [name]'s effort  and energy during the session and encouraged continued Therapist praised Brandy [name]'s energy and effort during the session. Therapist provided supportive therapy.   ASRS V1.1  Part-A 1. How often do you have trouble wrapping up the final details of a project,     once the challenging parts have been done? Sometimes 2. How often do you have difficulty getting things in order when you have to do     a task that requires organization? Often 3. How often do you have problems remembering appointments or obligations? Rarely 4. When you have a task that requires a lot of thought, how often do you avoid     or delay getting started? Often 5. How often do you fidget or squirm with your hands or feet when you have     to sit down for a long time? Often 6. How often do you feel overly active and compelled to do things, like you     were driven by a motor? Very Often  Part-B 7. How often do you make careless mistakes when you have to work on a boring or     difficult project? Sometimes 8. How often do you have difficulty keeping your attention when you are doing boring     or repetitive work? Very Often 9. How often do you have difficulty concentrating on what people say to you,      even when they are speaking to you directly? Sometimes 10. How often do you misplace or have difficulty finding things at home or at work? Rarely 11. How often are you distracted by activity or noise around you? Sometimes 12. How often do you leave  your seat in meetings or other situations in which       you are expected to remain seated? Rarely 13. How often do you feel restless or fidgety? Sometimes 14. How often do you have difficulty unwinding and relaxing when you have time       to yourself? Sometimes 15. How often do you find yourself talking too much when you are in social situations? Very Often 39. When you're in a conversation, how often do you find yourself finishing           the sentences of the people you  are talking to, before they can finish       them themselves? Sometimes 17. How often do you have difficulty waiting your turn in situations when       turn taking is required? Sometimes 18. How often do you interrupt others when they are busy? Sometimes   Interventions: Interpersonal & BA  Interventions:  BA    Diagnosis: Episode of recurrent major depressive disorder, unspecified depression episode severity (Brandy [name])  Treatment Plan:  [name], forthcoming, self-aware, and motivated for change.   Support System: Friend Brandy [name]) and Family.   Client Treatment Preferences Outpatient Therapy.  Client Statement of Needs Brandy [name]'s goals for treatment managing her mood and symptoms, processing past events, focus on self-care, set boundaries for self and others, decrease negative self-talk, decrease rumination, process relationship stressors, and improve health.   Treatment Level Weekly  Symptoms  Depressive symptoms: Decreased interest, feeling down, poor sleep, lethargy, psycho-motor retardation, feeling bad about self.  (Status: maintained)  Anxiety: Difficulty managing worry, worrying about different things, restlessness, irritability, afraid something awful might happen  (Status: maintained)  Goals:  Kariel experiences symptoms of depression and anxiety.   Target Date: 2022-06-08 Frequency: Weekly  Progress: 10 Modality: individual    Therapist will provide referrals for additional resources as appropriate.  Therapist will provide psycho-education regarding Brandy [name]'s diagnosis and corresponding treatment approaches and interventions. Licensed Clinical Social Worker, [place], LCSW will support the patient's ability to achieve the goals identified. will employ CBT, BA, Problem-solving, Solution Focused, Mindfulness,  coping skills, & other evidenced-based practices will be used to promote progress towards healthy functioning to help manage  decrease symptoms associated with her diagnosis.   Reduce overall level, frequency, and intensity of the feelings of depression and anxiety as evidenced by decreased overall symptoms of depression and anxiety from 6 to 7 days/week to 0 to 1 days/week per client report for at least 3 consecutive months. Verbally express understanding of the relationship between feelings of depression, anxiety and their impact on thinking patterns and behaviors. Verbalize an understanding of the role that distorted thinking plays in creating fears, excessive worry, and ruminations.  Brandy [name] participated in the creation of the treatment plan)   [name], LCSW

## 2022-05-31 ENCOUNTER — Ambulatory Visit (INDEPENDENT_AMBULATORY_CARE_PROVIDER_SITE_OTHER): Payer: PPO | Admitting: Psychology

## 2022-05-31 DIAGNOSIS — F339 Major depressive disorder, recurrent, unspecified: Secondary | ICD-10-CM | POA: Diagnosis not present

## 2022-05-31 NOTE — Progress Notes (Signed)
[facility] Counselor/Therapist Progress Note  Patient ID: Brandy [name], MRN: [id number]    Date: 05/31/22  Time Spent: 12:05 pm -  1:00 pm:  55 Minutes  Treatment Type: Individual Therapy.  Reported Symptoms: Anxiety   Mental Status Exam: Appearance:  Casual     Behavior: Appropriate  Motor: Normal  Speech/Language:  Clear and Coherent and Normal Rate  Affect: Appropriate  Mood: normal  Thought process: normal  Thought content:   WNL  Sensory/Perceptual disturbances:   WNL  Orientation: oriented to person, place, time/date, and situation  Attention: Good  Concentration: Good  Memory: WNL  Fund of knowledge:  Good  Insight:   Good  Judgment:  Good  Impulse Control: Good   Risk Assessment: Danger to Self:  No Self-injurious Behavior: No Danger to Others: No Duty to Warn:no Physical Aggression / Violence:No  Access to Firearms a concern: No  Gang Involvement:No   Subjective:   Brandy [name] participated home, via video, and consented to treatment. Therapist participated from home office. We met online due to [place] pandemic. Brandy [name] reviewed the events of the past week. She noted working towards a rhythm to day-to-day engagement and creating a routine. We worked on highlighting positives of her routine and discussed barriers. We engaged in problem-solving to address barriers. Therapist identified over thinking her routine which becomes a barrier to engagement. We worked on identifying positives, maintaining perspective, mini-minimizing negative thoughts, and setting reasonable expectations. She noted often losing focus providing background information that distracts from the goal at hand. She noted a need to discuss her attention and concentration and how it affects day-to-day. She noted often having rambling thoughts and statements. We will continue to process this going forward. Therapist praised Brandy [name]'s effort and energy during the session and  encouraged continued efforts.Therapist praised Brandy [name]'s energy and effort during the session. Therapist provided supportive therapy.   Interventions: BA  Diagnosis: Episode of recurrent major depressive disorder, unspecified depression episode severity ([place])  Treatment Plan:  [name], forthcoming, self-aware, and motivated for change.   Support System: Friend Brandy [name]) and Family.   Client Treatment Preferences Outpatient Therapy.  Client Statement of Needs Brandy [name]'s goals for treatment managing her mood and symptoms, processing past events, focus on self-care, set boundaries for self and others, decrease negative self-talk, decrease rumination, process relationship stressors, and improve health.   Treatment Level Weekly  Symptoms  Depressive symptoms: Decreased interest, feeling down, poor sleep, lethargy, psycho-motor retardation, feeling bad about self.  (Status: maintained)  Anxiety: Difficulty managing worry, worrying about different things, restlessness, irritability, afraid something awful might happen  (Status: maintained)  Goals:  Brandy [name] experiences symptoms of depression and anxiety.   Target Date: 2022-06-08 Frequency: Weekly  Progress: 10 Modality: individual    Therapist will provide referrals for additional resources as appropriate.  Therapist will provide psycho-education regarding Brandy [name]'s diagnosis and corresponding treatment approaches and interventions. Licensed Clinical Social Worker, [place], LCSW will support the patient's ability to achieve the goals identified. will employ CBT, BA, Problem-solving, Solution Focused, Mindfulness,  coping skills, & other evidenced-based practices will be used to promote progress towards healthy functioning to help manage decrease symptoms associated with her diagnosis.   Reduce overall level, frequency, and intensity of the feelings of depression and anxiety as evidenced by decreased  overall symptoms of depression and anxiety from 6 to 7 days/week to 0 to 1 days/week per client report for at least 3 consecutive months. Verbally express understanding  of the relationship between feelings of depression, anxiety and their impact on thinking patterns and behaviors. Verbalize an understanding of the role that distorted thinking plays in creating fears, excessive worry, and ruminations.  Brandy [name] participated in the creation of the treatment plan)   Brandy Irish, LCSW

## 2022-06-07 ENCOUNTER — Ambulatory Visit (INDEPENDENT_AMBULATORY_CARE_PROVIDER_SITE_OTHER): Payer: HMO | Admitting: Psychology

## 2022-06-07 DIAGNOSIS — F339 Major depressive disorder, recurrent, unspecified: Secondary | ICD-10-CM | POA: Diagnosis not present

## 2022-06-07 NOTE — Progress Notes (Signed)
[facility] Counselor/Therapist Progress Note  Patient ID: TRACA GILLOCK, MRN: [id number]    Date: 06/07/22  Time Spent: 12:02 pm -  12:58 pm: 56 Minutes  Treatment Type: Individual Therapy.  Reported Symptoms: Anxiety & depression.   Mental Status Exam: Appearance:  Casual     Behavior: Appropriate  Motor: Normal  Speech/Language:  Clear and Coherent and Normal Rate  Affect: Appropriate  Mood: normal  Thought process: normal  Thought content:   WNL  Sensory/Perceptual disturbances:   WNL  Orientation: oriented to person, place, time/date, and situation  Attention: Good  Concentration: Good  Memory: WNL  Fund of knowledge:  Good  Insight:   Good  Judgment:  Good  Impulse Control: Good   Risk Assessment: Danger to Self:  No Self-injurious Behavior: No Danger to Others: No Duty to Warn:no Physical Aggression / Violence:No  Access to Firearms a concern: No  Gang Involvement:No   Subjective:   Lisanna Kett Yaworski participated home, via video, and consented to treatment. Therapist participated from home office. We met online due to [place] pandemic. Telana Zanella Peddy reviewed the events of the past week.  She noted going to the gym this week and feeling accomplished during the session. Therapist praised Janeka for her effort to start her exercise routine and the mindfulness of her progress. Therapist discussed Carnella's progress in her fitness level in relation to difficulty ambulating in the past. She noted her difficulty with not meeting all her expectations for activities and task completion. She noted a need for flexibility in this area and focusing more on consistency. We worked on re-framing her perspective regarding unmet expectations of the "perfect" plan. She noted unmet needs, which we discussed. We discussed ways to manage this distress during the session and electing to focus on areas of which needs can be met. Therapist praised Quintella's effort and energy during the  session and encouraged continued efforts.Therapist praised Thania's energy and effort during the session. Therapist provided supportive therapy.   Interventions: Interpersonal & CBT  Diagnosis: Episode of recurrent major depressive disorder, unspecified depression episode severity ([place])  Treatment Plan:  Client Abilities/Strengths Jeaneth is intelligent, forthcoming, self-aware, and motivated for change.   Support System: Friend [name] and Family.   Client Treatment Preferences Outpatient Therapy.  Client Statement of Needs Saleen's goals for treatment managing her mood and symptoms, processing past events, focus on self-care, set boundaries for self and others, decrease negative self-talk, decrease rumination, process relationship stressors, and improve health.   Treatment Level Weekly  Symptoms  Depressive symptoms: Decreased interest, feeling down, poor sleep, lethargy, psycho-motor retardation, feeling bad about self.  (Status: maintained)  Anxiety: Difficulty managing worry, worrying about different things, restlessness, irritability, afraid something awful might happen  (Status: maintained)  Goals:  Jazelyn experiences symptoms of depression and anxiety.   Target Date: 2022-06-08 Frequency: Weekly  Progress: 10 Modality: individual    Therapist will provide referrals for additional resources as appropriate.  Therapist will provide psycho-education regarding Jessamine's diagnosis and corresponding treatment approaches and interventions. Licensed Clinical Social Worker, [place], LCSW will support the patient's ability to achieve the goals identified. will employ CBT, BA, Problem-solving, Solution Focused, Mindfulness,  coping skills, & other evidenced-based practices will be used to promote progress towards healthy functioning to help manage decrease symptoms associated with her diagnosis.   Reduce overall level, frequency, and intensity of the feelings of depression and anxiety as  evidenced by decreased overall symptoms of depression and anxiety from 6 to  7 days/week to 0 to 1 days/week per client report for at least 3 consecutive months. Verbally express understanding of the relationship between feelings of depression, anxiety and their impact on thinking patterns and behaviors. Verbalize an understanding of the role that distorted thinking plays in creating fears, excessive worry, and ruminations.  [name] participated in the creation of the treatment plan)   [name], LCSW

## 2022-06-14 ENCOUNTER — Ambulatory Visit (INDEPENDENT_AMBULATORY_CARE_PROVIDER_SITE_OTHER): Payer: HMO | Admitting: Psychology

## 2022-06-14 DIAGNOSIS — F339 Major depressive disorder, recurrent, unspecified: Secondary | ICD-10-CM

## 2022-06-14 NOTE — Progress Notes (Signed)
[facility] Counselor/Therapist Progress Note  Patient ID: Brandy [name], MRN: [id number]    Date: 06/14/22  Time Spent: 12:04 pm -  12:51 pm: 21 Minutes  Treatment Type: Individual Therapy.  Reported Symptoms: Anxiety & depression.   Mental Status Exam: Appearance:  Casual     Behavior: Appropriate  Motor: Normal  Speech/Language:  Clear and Coherent and Normal Rate  Affect: Appropriate  Mood: normal  Thought process: normal  Thought content:   WNL  Sensory/Perceptual disturbances:   WNL  Orientation: oriented to person, place, time/date, and situation  Attention: Good  Concentration: Good  Memory: WNL  Fund of knowledge:  Good  Insight:   Good  Judgment:  Good  Impulse Control: Good   Risk Assessment: Danger to Self:  No Self-injurious Behavior: No Danger to Others: No Duty to Warn:no Physical Aggression / Violence:No  Access to Firearms a concern: No  Gang Involvement:No   Subjective:   Brandy [name] participated home, via video, and consented to treatment. Therapist participated from home office. We met online due to [place] pandemic. Brandy [name] reviewed the events of the past week.  She continues to work towards building a daily rhythm. She noted working on building flexibility in her thinking and challenging rigidity regarding task completion. Therapist praised Brandy [name] for her effort in this area. Therapist highlighted  Brandy [name]'s anxiety in relation to making specific plans and the over thinking that becomes a barrier to task engagement and completion. We worked on identifying ways to manage her anxiety and over thinking via mindfulness, boundaries, and challenging negative self-talk. Therapist modeled this during the session. Therapist encouraged flexibility between sessions. Therapist praised Brandy [name]'s energy and effort during the session. Therapist provided supportive therapy.   Interventions: CBT  Diagnosis: Episode of recurrent major depressive  disorder, unspecified depression episode severity ([place])  Treatment Plan:  [name], forthcoming, self-aware, and motivated for change.   Support System: Friend Brandy [name]) and Family.   Client Treatment Preferences Outpatient Therapy.  Client Statement of Needs Brandy [name]'s goals for treatment managing her mood and symptoms, processing past events, focus on self-care, set boundaries for self and others, decrease negative self-talk, decrease rumination, process relationship stressors, and improve health.   Treatment Level Weekly  Symptoms  Depressive symptoms: Decreased interest, feeling down, poor sleep, lethargy, psycho-motor retardation, feeling bad about self.  (Status: maintained)  Anxiety: Difficulty managing worry, worrying about different things, restlessness, irritability, afraid something awful might happen  (Status: maintained)  Goals:  Kelena experiences symptoms of depression and anxiety.   Target Date: 2024-02-30 Frequency: Weekly  Progress: 10 Modality: individual    Therapist will provide referrals for additional resources as appropriate.  Therapist will provide psycho-education regarding Brandy [name]'s diagnosis and corresponding treatment approaches and interventions. Licensed Clinical Social Worker, [place], LCSW will support the patient's ability to achieve the goals identified. will employ CBT, BA, Problem-solving, Solution Focused, Mindfulness,  coping skills, & other evidenced-based practices will be used to promote progress towards healthy functioning to help manage decrease symptoms associated with her diagnosis.   Reduce overall level, frequency, and intensity of the feelings of depression and anxiety as evidenced by decreased overall symptoms of depression and anxiety from 6 to 7 days/week to 0 to 1 days/week per client report for at least 3 consecutive months. Verbally express understanding of the relationship between feelings of  depression, anxiety and their impact on thinking patterns and behaviors. Verbalize an understanding of the role that distorted thinking plays  in creating fears, excessive worry, and ruminations.  Brandy [name] participated in the creation of the treatment plan)  Brandy Irish, LCSW

## 2022-06-20 DIAGNOSIS — F411 Generalized anxiety disorder: Secondary | ICD-10-CM | POA: Diagnosis not present

## 2022-06-20 DIAGNOSIS — F5105 Insomnia due to other mental disorder: Secondary | ICD-10-CM | POA: Diagnosis not present

## 2022-06-20 DIAGNOSIS — R41840 Attention and concentration deficit: Secondary | ICD-10-CM | POA: Diagnosis not present

## 2022-06-20 DIAGNOSIS — F3181 Bipolar II disorder: Secondary | ICD-10-CM | POA: Diagnosis not present

## 2022-06-21 ENCOUNTER — Ambulatory Visit (INDEPENDENT_AMBULATORY_CARE_PROVIDER_SITE_OTHER): Payer: HMO | Admitting: Psychology

## 2022-06-21 DIAGNOSIS — F3289 Other specified depressive episodes: Secondary | ICD-10-CM | POA: Diagnosis not present

## 2022-06-21 NOTE — Progress Notes (Signed)
[facility] Counselor/Therapist Progress Note  Patient ID: Brandy [name], MRN: [id number]    Date: 06/21/22  Time Spent: 12:04 pm -  1:02 pm: 42 Minutes  Treatment Type: Individual Therapy.  Reported Symptoms: Anxiety & depression.   Mental Status Exam: Appearance:  Casual     Behavior: Appropriate  Motor: Normal  Speech/Language:  Clear and Coherent and Normal Rate  Affect: Appropriate  Mood: normal  Thought process: normal  Thought content:   WNL  Sensory/Perceptual disturbances:   WNL  Orientation: oriented to person, place, time/date, and situation  Attention: Good  Concentration: Good  Memory: WNL  Fund of knowledge:  Good  Insight:   Good  Judgment:  Good  Impulse Control: Good   Risk Assessment: Danger to Self:  No Self-injurious Behavior: No Danger to Others: No Duty to Warn:no Physical Aggression / Violence:No  Access to Firearms a concern: No  Gang Involvement:No   Subjective:   Brandy [name] participated home, via video, and consented to treatment. Therapist participated from home office. We met online due to [place] pandemic. Brandy [name] reviewed the events of the past week.  Brandy [name]  noted difficulty joining the session. She noted meeting with her psychiatric provider and discussed her interest in an ADHD assessment. She noted that she will be provided with resources for local psychologists that could further assess. No changes in medication was reported.  She noted working continuing building a routine that supports a positive mood and improved health. She noted working on setting boundaries for self and noted her efforts to maintaining said boundaries. She noted struggling to get to the gym and noted significant internal pressure. We worked on identifying barriers including not waking early, difficulty "Getting going", and "not pushing" towards the task. She noted difficulty getting out of bed when the alarm rings and often hitting the snooze  button. We worked on identifying ways to problem-solving this issue during the session. Therapist encouraged Brandy [name] to identify ways to disrupt the routine that makes it difficult to get out of bed. Therapist praised Brandy [name] for reframing her perspective, working on more  cognitive flexibility, and adopting more positive self-talk and adopting a positive approach.Therapist praised Brandy [name]'s energy and effort during the session. Therapist provided supportive therapy.   Interventions: CBT  Diagnosis: Other depression  Treatment Plan:  [name], forthcoming, self-aware, and motivated for change.   Support System: Friend Brandy [name]) and Family.   Client Treatment Preferences Outpatient Therapy.  Client Statement of Needs Brandy [name]'s goals for treatment managing her mood and symptoms, processing past events, focus on self-care, set boundaries for self and others, decrease negative self-talk, decrease rumination, process relationship stressors, and improve health.   Treatment Level Weekly  Symptoms  Depressive symptoms: Decreased interest, feeling down, poor sleep, lethargy, psycho-motor retardation, feeling bad about self.  (Status: maintained)  Anxiety: Difficulty managing worry, worrying about different things, restlessness, irritability, afraid something awful might happen  (Status: maintained)  Goals:  Brandy [name] experiences symptoms of depression and anxiety.   Target Date: 2024-02-30 Frequency: Weekly  Progress: 10 Modality: individual    Therapist will provide referrals for additional resources as appropriate.  Therapist will provide psycho-education regarding [name]'s diagnosis and corresponding treatment approaches and interventions. Licensed Clinical Social Worker, [place], LCSW will support the patient's ability to achieve the goals identified. will employ CBT, BA, Problem-solving, Solution Focused, Mindfulness,  coping skills, & other evidenced-based  practices will be used to promote progress towards healthy functioning to  help manage decrease symptoms associated with her diagnosis.   Reduce overall level, frequency, and intensity of the feelings of depression and anxiety as evidenced by decreased overall symptoms of depression and anxiety from 6 to 7 days/week to 0 to 1 days/week per client report for at least 3 consecutive months. Verbally express understanding of the relationship between feelings of depression, anxiety and their impact on thinking patterns and behaviors. Verbalize an understanding of the role that distorted thinking plays in creating fears, excessive worry, and ruminations.  Brandy [name] participated in the creation of the treatment plan)  [name], LCSW

## 2022-06-28 ENCOUNTER — Ambulatory Visit (INDEPENDENT_AMBULATORY_CARE_PROVIDER_SITE_OTHER): Payer: HMO | Admitting: Psychology

## 2022-06-28 DIAGNOSIS — F339 Major depressive disorder, recurrent, unspecified: Secondary | ICD-10-CM | POA: Diagnosis not present

## 2022-06-28 NOTE — Progress Notes (Signed)
Brandy [name], MRN: [id number]    Date: 06/28/22  Time Spent: 12:05 pm -  12:55 pm: 50 Minutes  Treatment Type: Individual Therapy.  Reported Symptoms: Anxiety & depression.   Mental Status Exam: Appearance:  Casual     Behavior: Appropriate  Motor: Normal  Speech/Language:  Clear and Coherent and Normal Rate  Affect: Appropriate  Mood: dysthymic  Thought process: normal  Thought content:   WNL  Sensory/Perceptual disturbances:   WNL  Orientation: oriented to person, place, time/date, and situation  Attention: Good  Concentration: Good  Memory: WNL  Fund of knowledge:  Good  Insight:   Good  Judgment:  Good  Impulse Control: Good   Risk Assessment: Danger to Self:  No Self-injurious Behavior: No Danger to Others: No Duty to Warn:no Physical Aggression / Violence:No  Access to Firearms a concern: No  Gang Involvement:No   Subjective:   Brandy [name] participated home, via video, and consented to treatment. Therapist participated from home office. We met online due to [place] pandemic. Brandy [name] reviewed the events of the past week.  Brandy [name] noted worked on attempting to minimize negative self-talk but noted "Failing" every single day in relation to her daily goals. She noted wondering if her diet affected her level of energy. She noted this being the entire week. She identified negative self-talk including "Am not ready to take a shower and go" & "I need to get the kitchen clean then I can go to the gym". We worked on identifying additional self-talk and worked on identifying ways to challenge this. We worked on managing self-talk, making decisions that would be beneficial long-term. Therapist modeled this during the session. We worked on highlighting her values and goals and discussed mindfulness regarding her self-talk and actions and their compatibility or incompatibility with her goals. She noted  her commitment towards working in this area. We discussed working on breaking down tasks to smaller steps that could be more accessible. We worked on identifying ways to engage in physical exercise, at home, when unable to get to the gym. Therapist praised Brandy [name]'s energy and effort during the session and encouraged continued problem-solving and mindfulness. Therapist provided supportive therapy.   Interventions: CBT & BA  Diagnosis: Episode of recurrent major depressive disorder, unspecified depression episode severity (Brandy [name])  Treatment Plan:  [name], forthcoming, self-aware, and motivated for change.   Support System: Friend Brandy [name]) and Family.   Client Treatment Preferences Outpatient Therapy.  Client Statement of Needs Brandy [name]'s goals for treatment managing her mood and symptoms, processing past events, focus on self-care, set boundaries for self and others, decrease negative self-talk, decrease rumination, process relationship stressors, and improve health.   Treatment Level Weekly  Symptoms  Depressive symptoms: Decreased interest, feeling down, poor sleep, lethargy, psycho-motor retardation, feeling bad about self.  (Status: maintained)  Anxiety: Difficulty managing worry, worrying about different things, restlessness, irritability, afraid something awful might happen  (Status: maintained)  Goals:  Brandy [name] experiences symptoms of depression and anxiety.   Target Date: 2024-02-30 Frequency: Weekly  Progress: 10 Modality: individual    Therapist will provide referrals for additional resources as appropriate.  Therapist will provide psycho-education regarding Brandy [name]'s diagnosis and corresponding treatment approaches and interventions. Licensed Clinical Social Worker, [place], LCSW will support the patient's ability to achieve the goals identified. will employ CBT, BA, Problem-solving, Solution Focused, Mindfulness,  coping skills, & other  evidenced-based practices will be used  to promote progress towards healthy functioning to help manage decrease symptoms associated with her diagnosis.   Reduce overall level, frequency, and intensity of the feelings of depression and anxiety as evidenced by decreased overall symptoms of depression and anxiety from 6 to 7 days/week to 0 to 1 days/week per client report for at least 3 consecutive months. Verbally express understanding of the relationship between feelings of depression, anxiety and their impact on thinking patterns and behaviors. Verbalize an understanding of the role that distorted thinking plays in creating fears, excessive worry, and ruminations.  Brandy [name] participated in the creation of the treatment plan)  Brandy Irish, LCSW

## 2022-07-03 ENCOUNTER — Encounter: Payer: Self-pay | Admitting: Primary Care

## 2022-07-03 ENCOUNTER — Ambulatory Visit (INDEPENDENT_AMBULATORY_CARE_PROVIDER_SITE_OTHER): Payer: HMO | Admitting: Primary Care

## 2022-07-03 VITALS — BP 108/64 | HR 67 | Temp 97.9°F | Ht 67.25 in | Wt 247.0 lb

## 2022-07-03 DIAGNOSIS — G8929 Other chronic pain: Secondary | ICD-10-CM

## 2022-07-03 DIAGNOSIS — K219 Gastro-esophageal reflux disease without esophagitis: Secondary | ICD-10-CM

## 2022-07-03 DIAGNOSIS — I10 Essential (primary) hypertension: Secondary | ICD-10-CM

## 2022-07-03 DIAGNOSIS — Z1231 Encounter for screening mammogram for malignant neoplasm of breast: Secondary | ICD-10-CM

## 2022-07-03 DIAGNOSIS — R5382 Chronic fatigue, unspecified: Secondary | ICD-10-CM

## 2022-07-03 DIAGNOSIS — Z0000 Encounter for general adult medical examination without abnormal findings: Secondary | ICD-10-CM

## 2022-07-03 DIAGNOSIS — Z9884 Bariatric surgery status: Secondary | ICD-10-CM | POA: Diagnosis not present

## 2022-07-03 DIAGNOSIS — F411 Generalized anxiety disorder: Secondary | ICD-10-CM

## 2022-07-03 DIAGNOSIS — E538 Deficiency of other specified B group vitamins: Secondary | ICD-10-CM

## 2022-07-03 DIAGNOSIS — E66812 Obesity, class 2: Secondary | ICD-10-CM

## 2022-07-03 DIAGNOSIS — F330 Major depressive disorder, recurrent, mild: Secondary | ICD-10-CM | POA: Diagnosis not present

## 2022-07-03 DIAGNOSIS — E1165 Type 2 diabetes mellitus with hyperglycemia: Secondary | ICD-10-CM

## 2022-07-03 DIAGNOSIS — M5442 Lumbago with sciatica, left side: Secondary | ICD-10-CM | POA: Diagnosis not present

## 2022-07-03 DIAGNOSIS — L719 Rosacea, unspecified: Secondary | ICD-10-CM

## 2022-07-03 DIAGNOSIS — Z87898 Personal history of other specified conditions: Secondary | ICD-10-CM

## 2022-07-03 DIAGNOSIS — E559 Vitamin D deficiency, unspecified: Secondary | ICD-10-CM

## 2022-07-03 DIAGNOSIS — G4733 Obstructive sleep apnea (adult) (pediatric): Secondary | ICD-10-CM

## 2022-07-03 DIAGNOSIS — Z6838 Body mass index (BMI) 38.0-38.9, adult: Secondary | ICD-10-CM

## 2022-07-03 LAB — LIPID PANEL
Cholesterol: 169 mg/dL (ref 0–200)
HDL: 66.6 mg/dL (ref >39.00)
LDL Cholesterol: 82 mg/dL (ref 0–99)
NonHDL: 102.46
Total CHOL/HDL Ratio: 3
Triglycerides: 100 mg/dL (ref 0.0–149.0)
VLDL: 20 mg/dL (ref 0.0–40.0)

## 2022-07-03 LAB — COMPREHENSIVE METABOLIC PANEL
ALT: 9 U/L (ref 0–35)
AST: 11 U/L (ref 0–37)
Albumin: 3.8 g/dL (ref 3.5–5.2)
Alkaline Phosphatase: 79 U/L (ref 39–117)
BUN: 27 mg/dL — ABNORMAL HIGH (ref 6–23)
CO2: 29 mEq/L (ref 19–32)
Calcium: 9.2 mg/dL (ref 8.4–10.5)
Chloride: 104 mEq/L (ref 96–112)
Creatinine, Ser: 0.8 mg/dL (ref 0.40–1.20)
GFR: 80.89 mL/min (ref >60.00)
Glucose, Bld: 95 mg/dL (ref 70–99)
Potassium: 3.9 mEq/L (ref 3.5–5.1)
Sodium: 140 mEq/L (ref 135–145)
Total Bilirubin: 0.4 mg/dL (ref 0.2–1.2)
Total Protein: 6.5 g/dL (ref 6.0–8.3)

## 2022-07-03 LAB — VITAMIN B12: Vitamin B-12: 610 pg/mL (ref 211–911)

## 2022-07-03 LAB — MICROALBUMIN / CREATININE URINE RATIO
Creatinine,U: 117.5 mg/dL
Microalb Creat Ratio: 0.7 mg/g (ref 0.0–30.0)
Microalb, Ur: 0.8 mg/dL (ref 0.0–1.9)

## 2022-07-03 LAB — IBC + FERRITIN
Ferritin: 35 ng/mL (ref 10.0–291.0)
Iron: 84 ug/dL (ref 42–145)
Saturation Ratios: 21.1 % (ref 20.0–50.0)
TIBC: 399 ug/dL (ref 250.0–450.0)
Transferrin: 285 mg/dL (ref 212.0–360.0)

## 2022-07-03 LAB — CBC
HCT: 38.4 % (ref 36.0–46.0)
Hemoglobin: 13.3 g/dL (ref 12.0–15.0)
MCHC: 34.5 g/dL (ref 30.0–36.0)
MCV: 91.5 fl (ref 78.0–100.0)
Platelets: 290 10*3/uL (ref 150.0–400.0)
RBC: 4.2 Mil/uL (ref 3.87–5.11)
RDW: 13.5 % (ref 11.5–15.5)
WBC: 7.8 10*3/uL (ref 4.0–10.5)

## 2022-07-03 LAB — TSH: TSH: 1.74 u[IU]/mL (ref 0.35–5.50)

## 2022-07-03 LAB — VITAMIN D 25 HYDROXY (VIT D DEFICIENCY, FRACTURES): VITD: 27.29 ng/mL — ABNORMAL LOW (ref 30.00–100.00)

## 2022-07-03 MED ORDER — SCOPOLAMINE 1 MG/3DAYS TD PT72: 1.0000 | MEDICATED_PATCH | TRANSDERMAL | 0 refills | Status: DC

## 2022-07-03 NOTE — Assessment & Plan Note (Signed)
Recommended Shingrix, she will consider.  Pap smear due, she declines today, defer until next year.  Mammogram due, orders placed. Colonoscopy cancer screening UTD, due 2025  Discussed the importance of a healthy diet and regular exercise in order for weight loss, and to reduce the risk of further co-morbidity.  Exam stable. Labs pending.  Follow up in 1 year for repeat physical.

## 2022-07-03 NOTE — Assessment & Plan Note (Signed)
Somewhat improved.  Continue gabapentin 300 mg BID to TID PRN. Encouraged she begin exercising in the gym.

## 2022-07-03 NOTE — Assessment & Plan Note (Signed)
Not taking vitamin D. Repeat vitamin D pending.

## 2022-07-03 NOTE — Assessment & Plan Note (Signed)
Overall controlled.  Continue Cymbalta 90 mg daily, Lamictal 300 mg daily, citalopram 40 mg daily. Following with psychiatry and therapy.

## 2022-07-03 NOTE — Assessment & Plan Note (Signed)
Controlled.  Continue pantoprazole 40 mg daily.

## 2022-07-03 NOTE — Assessment & Plan Note (Signed)
Ongoing weight loss, commended her on this! Encouraged exercise.  Repeat labs pending.

## 2022-07-03 NOTE — Patient Instructions (Signed)
Stop by the lab prior to leaving today. I will notify you of your results once received.   [facility]   Schedule a visit with Dr. Lorelei Pont for your shoulder.

## 2022-07-03 NOTE — Assessment & Plan Note (Signed)
No longer on treatment. Repeat A1C pending.  Commended her on ongoing weight loss.  Foot exam today.

## 2022-07-03 NOTE — Assessment & Plan Note (Signed)
Ongoing for which is likely largely secondary to untreated severe sleep apnea. Will also check labs to rule out metabolic cause.   Referral placed back to pulmonology for sleep apnea treatment options.

## 2022-07-03 NOTE — Progress Notes (Signed)
Subjective:    Patient ID: Brandy [name], female    DOB: 1965/10/07, 57 y.o.   MRN: [id number]  HPI  Bendetta Soltani Emberson is a very pleasant 57 y.o. female who presents today for complete physical and follow up of chronic conditions.  Immunizations: -Tetanus: Completed in 2011 -Influenza: Did not complete this season  -Shingles: Never completed -Pneumonia: Completed 2020  Diet: [place].  Exercise: No regular exercise. Recently joined [employer]   Eye exam: Completes annually  Dental exam: Completed several years ago.     Pap Smear: Completed in March 2021, declines today Mammogram: October 2022  Colonoscopy: Completed Cologuard in 2022   BP Readings from Last 3 Encounters:  07/03/22 108/64  11/09/21 128/76  07/17/21 (!) 128/92   Wt Readings from Last 3 Encounters:  07/03/22 247 lb (112 kg)  01/17/22 258 lb 12.8 oz (117.4 kg)  11/09/21 256 lb (116.1 kg)      Review of Systems  Constitutional:  Negative for unexpected weight change.  HENT:  Negative for rhinorrhea.   Respiratory:  Negative for cough and shortness of breath.   Cardiovascular:  Negative for chest pain.  Gastrointestinal:  Negative for constipation and diarrhea.  Genitourinary:  Negative for difficulty urinating.  Musculoskeletal:  Positive for arthralgias and back pain.  Skin:  Negative for rash.  Allergic/Immunologic: Negative for environmental allergies.  Neurological:  Negative for dizziness and headaches.  Psychiatric/Behavioral:  The patient is nervous/anxious.          Past Medical History:  Diagnosis Date   Anxiety    Arthritis    Back pain    Brain fog    CHF (congestive heart failure) (HCC)    pt not aware of this   Depression    Diabetes mellitus without complication (HCC)    Difficulty walking    Dysmetabolic syndrome X    Fatigue    Fibromyalgia    neuropathy   Hypertension    Hypertriglyceridemia    Joint pain    Leg weakness    Lower extremity edema    Muscle  atrophy    Neuropathy    Obesity    Osteoarthritis    PONV (postoperative nausea and vomiting)    Poor memory    Post-menopausal bleeding    Prediabetes    Rosacea    Sleep apnea    not on cpap yet   Tetanus vaccine causing adverse effect in therapeutic use    Vitamin D deficiency     Social History   Socioeconomic History   Marital status: Married    Spouse name: Ed   Number of children: 3   Years of education: Not on file   Highest education level: Not on file  Occupational History   Occupation: Estate manager/land agent Retirement    Employer: [employer]  Tobacco Use   Smoking status: Never   Smokeless tobacco: Never  Vaping Use   Vaping Use: Never used  Substance and Sexual Activity   Alcohol use: Not Currently    Comment: rarely - once a year   Drug use: No   Sexual activity: Not Currently    Birth control/protection: Post-menopausal  Other Topics Concern   Not on file  Social History Narrative   Designer, jewellery      Married; 3 kids (middle child-severely autistic)      No regular exercise   Social Determinants of Health   Financial Resource Strain: Not on file  Food Insecurity:  Not on file  Transportation Needs: Not on file  Physical Activity: Not on file  Stress: Not on file  Social Connections: Not on file  Intimate Partner Violence: Not on file    Past Surgical History:  Procedure Laterality Date   BONE TUMOR EXCISION  1975   Right leg   DILATATION & CURETTAGE/HYSTEROSCOPY WITH MYOSURE N/A 10/07/2019   Procedure: DILATATION AND CURETTAGE /HYSTEROSCOPY, Myosure, Polypectomy;  Surgeon: Aletha Halim, MD;  Location: [place];  Service: Gynecology;  Laterality: N/A;   ENDOMETRIAL BIOPSY  07/14/2019       epidural steroid injection     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   Procedure: LAPAROSCOPIC ROUX-EN-Y GASTRIC BYPASS WITH UPPER ENDOSCOPY;  Surgeon: Kieth Brightly, [name] Bruce, MD;  Location: [facility];  Service: General;  Laterality: N/A;     Family History  Problem Relation Age of Onset   Rheum arthritis Mother    COPD Mother    Stroke Mother    Depression Mother    Anxiety disorder Mother    Eating disorder Mother    Obesity Mother    Hypertension Father    Depression Father    Alcoholism Father    Heart disease Maternal Grandmother    Stroke Maternal Grandmother    Alcohol abuse Maternal Grandmother    Alcohol abuse Maternal Grandfather    Aneurysm Paternal Grandmother    Autism Son        severe   Diabetes Paternal Aunt    Breast cancer Neg Hx     Allergies  Allergen Reactions   Diphenhydramine Hcl Rash    minor rash   Penicillins Rash    Current Outpatient Medications on File Prior to Visit  Medication Sig Dispense Refill   acetaminophen (TYLENOL) 650 MG CR tablet Take 1,300 mg by mouth every 8 (eight) hours as needed for pain.     atorvastatin (LIPITOR) 10 MG tablet TAKE 1 TABLET BY MOUTH EVERY DAY FOR CHOLESTEROL 90 tablet 2   cetirizine (ZYRTEC) 10 MG tablet TAKE 1 TABLET (10 MG TOTAL) BY MOUTH DAILY. FOR ALLERGIES 90 tablet 1   citalopram (CELEXA) 40 MG tablet Take 40 mg by mouth daily.     DULoxetine (CYMBALTA) 30 MG capsule Take 30 mg by mouth daily. Take with 60 mg to equal 90 mg daily     DULoxetine (CYMBALTA) 60 MG capsule Take 60 mg by mouth daily. Take with 30 mg to equal 90 mg daily     folic acid (FOLVITE) 1 MG tablet Take 1 mg by mouth daily.     gabapentin (NEURONTIN) 300 MG capsule TAKE 1 CAPSULE BY MOUTH 3 TIMES A DAY AS NEEDED FOR BACK PAIN 270 capsule 3   lamoTRIgine (LAMICTAL) 100 MG tablet Take 100 mg by mouth every evening.     lamoTRIgine (LAMICTAL) 200 MG tablet Take 200 mg by mouth in the morning.     lisinopril (ZESTRIL) 30 MG tablet TAKE 1 TABLET (30 MG TOTAL) BY MOUTH DAILY. FOR BLOOD PRESSURE 90 tablet 2   metroNIDAZOLE (METROCREAM) 0.75 % cream Apply 1 application topically daily as needed (rosacea).     Multiple Minerals-Vitamins (CALCIUM-MAGNESIUM-ZINC-D3) TABS Take 2  tablets by mouth daily.     ondansetron (ZOFRAN-ODT) 4 MG disintegrating tablet Dissolve 1 tablet (4 mg total) by mouth every 6 (six) hours as needed for nausea or vomiting. 20 tablet 0   pantoprazole (PROTONIX) 40 MG tablet Take 1 tablet (40 mg total) by mouth daily. 90 tablet 0   traZODone (  DESYREL) 50 MG tablet Take 50 mg by mouth at bedtime as needed for sleep.     Vitamin D, Ergocalciferol, (DRISDOL) 1.25 MG (50000 UNIT) CAPS capsule TAKE 1 CAPSULE BY MOUTH ONCE WEEKLY FOR VITAMIN D. 12 capsule 1   sucralfate (CARAFATE) 1 g tablet Take by mouth. (Patient not taking: Reported on 07/03/2022)     No current facility-administered medications on file prior to visit.    BP 108/64   Pulse 67   Temp 97.9 F (36.6 C) (Temporal)   Ht 5' 7.25" (1.708 m)   Wt 247 lb (112 kg)   LMP  (LMP Unknown)   SpO2 98%   BMI 38.40 kg/m  Objective:   Physical Exam HENT:     Right Ear: Tympanic membrane and ear canal normal.     Left Ear: Tympanic membrane and ear canal normal.     Nose: Nose normal.  Eyes:     Conjunctiva/sclera: Conjunctivae normal.     Pupils: Pupils are equal, round, and reactive to light.  Neck:     Thyroid: No thyromegaly.  Cardiovascular:     Rate and Rhythm: Normal rate and regular rhythm.     Heart sounds: No murmur heard. Pulmonary:     Effort: Pulmonary effort is normal.     Breath sounds: Normal breath sounds. No rales.  Abdominal:     General: Bowel sounds are normal.     Palpations: Abdomen is soft.     Tenderness: There is no abdominal tenderness.  Musculoskeletal:        General: Normal range of motion.     Cervical back: Neck supple.  Lymphadenopathy:     Cervical: No cervical adenopathy.  Skin:    General: Skin is warm and dry.     Findings: No rash.  Neurological:     Mental Status: She is alert and oriented to person, place, and time.     Cranial Nerves: No cranial nerve deficit.     Deep Tendon Reflexes: Reflexes are normal and symmetric.   Psychiatric:        Mood and Affect: Mood normal.           Assessment & Plan:  Preventative health care Assessment & Plan: Recommended Shingrix, she will consider.  Pap smear due, she declines today, defer until next year.  Mammogram due, orders placed. Colonoscopy cancer screening UTD, due 2025  Discussed the importance of a healthy diet and regular exercise in order for weight loss, and to reduce the risk of further co-morbidity.  Exam stable. Labs pending.  Follow up in 1 year for repeat physical.    Essential hypertension, benign Assessment & Plan: Controlled.  Home readings are 120's-130's/70's-80's.  Continue lisinopril 30 mg daily. CMP pending.  Orders: -     Comprehensive metabolic panel -     CBC  GAD (generalized anxiety disorder) Assessment & Plan: Overall controlled.  Continue Cymbalta 90 mg daily, Lamictal 300 mg daily, citalopram 40 mg daily. Following with psychiatry and therapy.      Severe obstructive sleep apnea-hypopnea syndrome Assessment & Plan: Not using CPAP machine due to autistic son. Referral placed back to pulmonology for CPAP options.   Orders: -     Ambulatory referral to Pulmonology  Type 2 diabetes mellitus with hyperglycemia, without long-term current use of insulin ([place]) Assessment & Plan: No longer on treatment. Repeat A1C pending.  Commended her on ongoing weight loss.  Foot exam today.  Orders: -  Microalbumin / creatinine urine ratio -     Lipid panel -     Hemoglobin A1c  Chronic fatigue Assessment & Plan: Ongoing for which is likely largely secondary to untreated severe sleep apnea. Will also check labs to rule out metabolic cause.   Referral placed back to pulmonology for sleep apnea treatment options.   Orders: -     VITAMIN D 25 Hydroxy (Vit-D Deficiency, Fractures) -     Vitamin B12 -     IBC + Ferritin -     TSH  Class 2 severe obesity due to excess calories with serious comorbidity and  body mass index (BMI) of 38.0 to 38.9 in adult [facility]) Assessment & Plan: Ongoing weight loss, commended her on this! Encouraged exercise.  Repeat labs pending.   Rosacea Assessment & Plan: Overall controlled.  Continue metronidazole cream 0.75% PRN.   Mild episode of recurrent major depressive disorder ([place]) Assessment & Plan: Overall controlled.  Continue Cymbalta 90 mg daily, Lamictal 300 mg daily, citalopram 40 mg daily. Following with psychiatry and therapy.    Vitamin B12 deficiency Assessment & Plan: Compliant to B12 vitamins daily. Repeat B12 level pending.  Orders: -     Vitamin B12 -     Vitamin B1  Vitamin D deficiency Assessment & Plan: Not taking vitamin D. Repeat vitamin D pending.  Orders: -     VITAMIN D 25 Hydroxy (Vit-D Deficiency, Fractures) -     Vitamin B1  Chronic bilateral low back pain with left-sided sciatica Assessment & Plan: Somewhat improved.  Continue gabapentin 300 mg BID to TID PRN. Encouraged she begin exercising in the gym.   Gastroesophageal reflux disease, unspecified whether esophagitis present Assessment & Plan: Controlled.  Continue pantoprazole 40 mg daily.   Screening mammogram for breast cancer -     3D Screening Mammogram, Left and Right; Future  S/P gastric bypass Assessment & Plan: Repeat labs pending.   Orders: -     Vitamin B1 -     IBC + Ferritin -     TSH  History of motion sickness -     Scopolamine; Place 1 patch (1.5 mg total) onto the skin every 3 (three) days.  Dispense: 10 patch; Refill: 0        Pleas Koch, NP

## 2022-07-03 NOTE — Assessment & Plan Note (Signed)
Controlled.  Home readings are 120's-130's/70's-80's.  Continue lisinopril 30 mg daily. CMP pending.

## 2022-07-03 NOTE — Assessment & Plan Note (Signed)
Compliant to B12 vitamins daily. Repeat B12 level pending.

## 2022-07-03 NOTE — Assessment & Plan Note (Signed)
Repeat labs pending

## 2022-07-03 NOTE — Assessment & Plan Note (Signed)
Overall controlled.  Continue metronidazole cream 0.75% PRN.

## 2022-07-03 NOTE — Assessment & Plan Note (Signed)
Not using CPAP machine due to autistic son. Referral placed back to pulmonology for CPAP options.

## 2022-07-04 LAB — HEMOGLOBIN A1C: Hgb A1c MFr Bld: 6 % (ref 4.6–6.5)

## 2022-07-04 NOTE — Telephone Encounter (Signed)
Pt is aware that Brandy [name] is out of the office and there may be a delay in response time.

## 2022-07-04 NOTE — Telephone Encounter (Signed)
Pt is aware that Anda Kraft is out of the office and there may be a delay in response time.

## 2022-07-05 ENCOUNTER — Ambulatory Visit (INDEPENDENT_AMBULATORY_CARE_PROVIDER_SITE_OTHER): Payer: HMO | Admitting: Psychology

## 2022-07-05 DIAGNOSIS — F339 Major depressive disorder, recurrent, unspecified: Secondary | ICD-10-CM

## 2022-07-05 NOTE — Progress Notes (Signed)
Comprehensive Clinical Assessment (CCA) Note  07/05/2022 Brandy [name] [id number]  Time Spent: 12:04  pm -12:53  pm: 60 Minutes  Chief Complaint: No chief complaint on file.  Visit Diagnosis: Depression.    Guardian/Payee:  self.     Paperwork requested: No   Reason for Visit /Presenting Problem: depression and anxiety.   Mental Status Exam: Appearance:   Fairly Groomed     Behavior:  Appropriate  Motor:  Normal  Speech/Language:   Clear and Coherent and Normal Rate  Affect:  Congruent  Mood:  normal  Thought process:  normal  Thought content:    WNL  Sensory/Perceptual disturbances:    WNL  Orientation:  oriented to person, place, time/date, and situation  Attention:  Good  Concentration:  Good  Memory:  WNL  Fund of knowledge:   Good  Insight:    Good  Judgment:   Good  Impulse Control:  Good   Reported Symptoms:  Depression & anxiety.   Risk Assessment: Danger to Self:  No Self-injurious Behavior: No Danger to Others: No Duty to Warn:no Physical Aggression / Violence:No  Access to Firearms a concern: No  Gang Involvement:No  Patient / guardian was educated about steps to take if suicide or homicide risk level increases between visits: yes While future psychiatric events cannot be accurately predicted, the patient does not currently require acute inpatient psychiatric care and does not currently meet [facility] involuntary commitment criteria.  In case of a mental health emergency:  19 - confidential suicide hotline. [facility]):        [place], [place] [address]       [phone] 3.   911  4.   Visiting Nearest ED.   Substance Abuse History: Current substance abuse: No    Caffeine Use: coffee, unsweetened tea (not daily) = totals of 4 drinks during a week.  Substance Use: denied.  Alcohol Use: denied. Tobacco Use:  denied.   Past Psychiatric History:   Previous psychological history is  significant for anxiety and depression Outpatient Providers: [name], [facility]. Cephus Shelling, MD (Psychiatry)  History of Psych Hospitalization: No  Psychological Testing:  n/a    Abuse History:  Victim of: No.,  n/a    Report needed: No. Victim of Neglect:No. Perpetrator of  n/a   Witness / Exposure to Domestic Violence: No   Protective Services Involvement: No  Witness to [employer] Violence:  No   Family History:  Family History  Problem Relation Age of Onset   Rheum arthritis Mother    COPD Mother    Stroke Mother    Depression Mother    Anxiety disorder Mother    Eating disorder Mother    Obesity Mother    Hypertension Father    Depression Father    Alcoholism Father    Heart disease Maternal Grandmother    Stroke Maternal Grandmother    Alcohol abuse Maternal Grandmother    Alcohol abuse Maternal Grandfather    Aneurysm Paternal Grandmother    Autism Son        severe   Diabetes Paternal Aunt    Breast cancer Neg Hx     Living situation: the patient lives with their family  Sexual Orientation: Straight  Relationship Status: married  Name of spouse / other: Ed (33) If a parent, number of children / ages: [name] -32, Quinn 27, Tyler 31.  Support Systems: spouse friends  Museum/gallery curator  Stress:  Yes   Income/Employment/Disability: No income  Armed forces logistics/support/administrative officer: No   Educational History: Education: some college  Religion/Sprituality/World View: Believes in God  Any cultural differences that may affect / interfere with treatment:  not applicable   Recreation/Hobbies: TBD  Stressors: Financial difficulties   Other: politics, low-energy, family stressors.     Strengths: Supportive Relationships, Self Advocate, and Able to Communicate Effectively  Barriers:  Mood, finances.    Legal History: Pending legal issue / charges: The patient has no significant history of legal issues. History of legal issue / charges:   n/a  Medical History/Surgical History: reviewed Past Medical History:  Diagnosis Date   Anxiety    Arthritis    Back pain    Brain fog    CHF (congestive heart failure) ([place])    pt not aware of this   Depression    Diabetes mellitus without complication (HCC)    Difficulty walking    Dysmetabolic syndrome X    Fatigue    Fibromyalgia    neuropathy   Hypertension    Hypertriglyceridemia    Joint pain    Leg weakness    Lower extremity edema    Muscle atrophy    Neuropathy    Obesity    Osteoarthritis    PONV (postoperative nausea and vomiting)    Poor memory    Post-menopausal bleeding    Prediabetes    Rosacea    Sleep apnea    not on cpap yet   Tetanus vaccine causing adverse effect in therapeutic use    Vitamin D deficiency     Past Surgical History:  Procedure Laterality Date   BONE TUMOR EXCISION  1975   Right leg   DILATATION & CURETTAGE/HYSTEROSCOPY WITH MYOSURE N/A 10/07/2019   Procedure: DILATATION AND CURETTAGE /HYSTEROSCOPY, Myosure, Polypectomy;  Surgeon: Aletha Halim, MD;  Location: [place];  Service: Gynecology;  Laterality: N/A;   ENDOMETRIAL BIOPSY  07/14/2019       epidural steroid injection     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     atorvastatin (LIPITOR) 10 MG tablet TAKE 1 TABLET BY MOUTH EVERY DAY FOR CHOLESTEROL 90 tablet 2   cetirizine (ZYRTEC) 10 MG tablet TAKE 1 TABLET (10 MG TOTAL) BY MOUTH DAILY. FOR ALLERGIES 90 tablet 1   citalopram (CELEXA) 40 MG tablet Take 40 mg by mouth daily.     DULoxetine (CYMBALTA) 30 MG capsule Take 30 mg by mouth daily. Take with 60 mg to equal 90 mg daily      DULoxetine (CYMBALTA) 60 MG capsule Take 60 mg by mouth daily. Take with 30 mg to equal 90 mg daily     folic acid (FOLVITE) 1 MG tablet Take 1 mg by mouth daily.     gabapentin (NEURONTIN) 300 MG capsule TAKE 1 CAPSULE BY MOUTH 3 TIMES A DAY AS NEEDED FOR BACK PAIN 270 capsule 3   lamoTRIgine (LAMICTAL) 100 MG tablet Take 100 mg by mouth every evening.     lamoTRIgine (LAMICTAL) 200 MG tablet Take 200 mg by mouth in the morning.     lisinopril (ZESTRIL) 30 MG tablet TAKE 1 TABLET (30  MG TOTAL) BY MOUTH DAILY. FOR BLOOD PRESSURE 90 tablet 2   metroNIDAZOLE (METROCREAM) 0.75 % cream Apply 1 application topically daily as needed (rosacea).     Multiple Minerals-Vitamins (CALCIUM-MAGNESIUM-ZINC-D3) TABS Take 2 tablets by mouth daily.     ondansetron (ZOFRAN-ODT) 4 MG disintegrating tablet Dissolve 1 tablet (4 mg total) by mouth every 6 (six) hours as needed for nausea or vomiting. 20 tablet 0   pantoprazole (PROTONIX) 40 MG tablet Take 1 tablet (40 mg total) by mouth daily. 90 tablet 0   scopolamine (TRANSDERM-SCOP) 1 MG/3DAYS Place 1 patch (1.5 mg total) onto the skin every 3 (three) days. 10 patch 0   sucralfate (CARAFATE) 1 g tablet Take by mouth. (Patient not taking: Reported on 07/03/2022)     traZODone (DESYREL) 50 MG tablet Take 50 mg by mouth at bedtime as needed for sleep.     Vitamin D, Ergocalciferol, (DRISDOL) 1.25 MG (50000 UNIT) CAPS capsule TAKE 1 CAPSULE BY MOUTH ONCE WEEKLY FOR VITAMIN D. 12 capsule 1   No current facility-administered medications for this visit.    Allergies  Allergen Reactions   Diphenhydramine Hcl Rash    minor rash   Penicillins Rash    Diagnoses:  Episode of recurrent major depressive disorder, unspecified depression episode severity ([place])     07/05/2022   12:13 PM 07/03/2022   11:05 AM 11/21/2021   11:22 AM 11/13/2021    4:35 PM 10/27/2021   11:13 AM  Depression screen PHQ 2/9  Decreased Interest '1 2 2 1 1  '$ Down, Depressed, Hopeless '1 1 2 1 1  '$ PHQ - 2  Score '2 3 4 2 2  '$ Altered sleeping '2 3 3 3 2  '$ Tired, decreased energy '3 3 3 3 1  '$ Change in appetite 1 2 0 0 0  Feeling bad or failure about yourself  '3 2 3 3 3  '$ Trouble concentrating '3 3 2 2 3  '$ Moving slowly or fidgety/restless 0 1 0 2 2  Suicidal thoughts 0 0 0 0 0  PHQ-9 Score '14 17 15 15 13  '$ Difficult doing work/chores  Somewhat difficult Very difficult Very difficult Somewhat difficult      07/05/2022   12:18 PM 06/01/2021    1:21 PM 03/17/2018   10:44 AM  GAD 7 : Generalized Anxiety Score  Nervous, Anxious, on Edge 2 0 3  Control/stop worrying '2 1 3  '$ Worry too much - different things '1 1 3  '$ Trouble relaxing 1 0 1  Restless '3 3 1  '$ Easily annoyed or irritable 0 1 1  Afraid - awful might happen '3 1 3  '$ Total GAD 7 Score '12 7 15  '$ Anxiety Difficulty Somewhat difficult Not difficult at all Somewhat difficult     Plan of Care: Outpatient Therapy and psychiatric treatment.   Narrative:   Nonda Sosinski Cardozo participated from car, via video, and consented to treatment. Therapist participated from home office. We met online due to [place] pandemic.  This is Brandy [name]'s annual reassessment.  Brandy [name] is receiving psychiatric services from Dr. Cephus Shelling, MD, local psychiatrist who has been treating her for some time. Brandy [name] participates in regular appointments and noted this going well. We taking medication consistently and without reported concerns.  Her stressors include familial stressors, financial stressors, dealing with political stressors, and mood. Brandy [name] noted having bariatric surgery. Brandy [name] noted her highest weight was 408. Brandy [name] was 89 prior to surgery and currently weighs 244 lbs for a total loss of 114 lbs as  of evaluation date. Brandy [name] has a complicated health history and noted that Brandy [name] meets with her providers regularly and recently completed pulmonary rehab. Brandy [name] continues to struggle with low motivation and creating a day-to-day routine. Brandy [name] endorsed symptoms of executive dysfunctioning. Brandy [name] did have a  positive screening for ADHD using the ASRS v1.1. Brandy [name] was previously referred for an ADHD assessment by her psychiatric provider. Psycho-education was provided regarding ADHD assessments and benefits. Brandy [name] will discussed additional concerns and ask questions with her psychiatric provider prior to pursuing the evaluation.  We completed the GAD-7 and PHQ-9 during the evaluation. Please see above for results. Safety was reviewed due to history of SI. Brandy [name] expressed understanding and agreement to maintain safety.  Worried benefit from consistent treatment to process past events, bolster coping skills, engage consistently in self-care, developing a daily routine to support positive and healthy mood, and actively manage symptoms.  Brandy [name] has responded well to treatment in the past as forthcoming and motivated for change.  Brandy [name] has been compliant with treatment  psychologically, psychiatrically,  and medically. Brandy [name] we worked on identifying her treatment goals, between sessions, to be reviewed during our follow-up.    [name], LCSW

## 2022-07-05 NOTE — Telephone Encounter (Signed)
Brandy [name],   Can you forward her labs to her psychiatrist (Dr. Nicolasa Ducking) and bariatric surgeon (Dr. Kieth Brightly - Care Everywhere)?  Also needs printed Rx faxed for glucometer and supplies.   Thanks! Allie Bossier, NP-C

## 2022-07-06 ENCOUNTER — Other Ambulatory Visit: Payer: Self-pay | Admitting: Primary Care

## 2022-07-06 DIAGNOSIS — E1165 Type 2 diabetes mellitus with hyperglycemia: Secondary | ICD-10-CM

## 2022-07-06 MED ORDER — LANCET DEVICE MISC: 1.0000 | Freq: Three times a day (TID) | 0 refills | Status: AC

## 2022-07-06 MED ORDER — BLOOD GLUCOSE MONITORING SUPPL DEVI: 1.0000 | Freq: Three times a day (TID) | 0 refills | Status: AC

## 2022-07-06 MED ORDER — LANCETS MISC. MISC: 1.0000 | Freq: Three times a day (TID) | 0 refills | Status: AC

## 2022-07-06 MED ORDER — BLOOD GLUCOSE TEST VI STRP: 1.0000 | ORAL_STRIP | Freq: Three times a day (TID) | 0 refills | Status: AC

## 2022-07-08 LAB — VITAMIN B1: Vitamin B1 (Thiamine): 11 nmol/L (ref 8–30)

## 2022-07-10 NOTE — Progress Notes (Unsigned)
    Zayah Keilman T. Mervin Ramires, MD, [place] [facility] [place], [address]  Phone: [phone]  FAX: [phone]  Brandy [name] - 57 y.o. female  MRN [id number]  Date of Birth: 1965/05/22  Date: 07/11/2022  PCP: Pleas Koch, NP  Referral: Pleas Koch, NP  No chief complaint on file.  Subjective:   Brandy [name] is a 57 y.o. very pleasant female patient with There is no height or weight on file to calculate BMI. who presents with the following:  The patient presents with shoulder and back pain -predominantly here for shoulder.  History is significant for Roux-en-Y gastric bypass.  She was here for general physical 7 days ago with Mrs. Clark.  She has had epidural steroid injections for her back pain in the past.  She is currently taking gabapentin 300 mg twice daily to 3 times daily.  She also is on Cymbalta 90 mg a day.    Review of Systems is noted in the HPI, as appropriate  Objective:   LMP  (LMP Unknown)   GEN: No acute distress; alert,appropriate. PULM: Breathing comfortably in no respiratory distress PSYCH: Normally interactive.   Laboratory and Imaging Data:  Assessment and Plan:   ***

## 2022-07-11 ENCOUNTER — Ambulatory Visit (INDEPENDENT_AMBULATORY_CARE_PROVIDER_SITE_OTHER): Payer: HMO | Admitting: Family Medicine

## 2022-07-11 ENCOUNTER — Encounter: Payer: Self-pay | Admitting: Family Medicine

## 2022-07-11 VITALS — BP 110/72 | HR 79 | Temp 97.4°F | Ht 67.25 in | Wt 249.4 lb

## 2022-07-11 DIAGNOSIS — M5442 Lumbago with sciatica, left side: Secondary | ICD-10-CM

## 2022-07-11 DIAGNOSIS — M7581 Other shoulder lesions, right shoulder: Secondary | ICD-10-CM

## 2022-07-11 DIAGNOSIS — G8929 Other chronic pain: Secondary | ICD-10-CM

## 2022-07-11 DIAGNOSIS — M7541 Impingement syndrome of right shoulder: Secondary | ICD-10-CM

## 2022-07-11 MED ORDER — TRIAMCINOLONE ACETONIDE 40 MG/ML IJ SUSP
40.0000 mg | Freq: Once | INTRAMUSCULAR | Status: AC
  Administered 2022-07-11: 40 mg via INTRA_ARTICULAR

## 2022-07-12 ENCOUNTER — Encounter: Payer: Self-pay | Admitting: Family Medicine

## 2022-07-12 ENCOUNTER — Ambulatory Visit: Payer: HMO | Admitting: Psychology

## 2022-07-19 ENCOUNTER — Ambulatory Visit (INDEPENDENT_AMBULATORY_CARE_PROVIDER_SITE_OTHER): Payer: HMO | Admitting: Psychology

## 2022-07-19 DIAGNOSIS — F339 Major depressive disorder, recurrent, unspecified: Secondary | ICD-10-CM

## 2022-07-19 NOTE — Progress Notes (Signed)
[place] [facility] Counselor/Therapist Progress Note  Patient ID: Brandy [name], MRN: [id number]   Date: 07/19/22  Time Spent: 12:07  pm - 1:00 pm : 53 Minutes  Treatment Type: Individual Therapy.  Reported Symptoms: depression and anxiety.  Mental Status Exam: Appearance:  Casual     Behavior: Appropriate  Motor: Normal  Speech/Language:  Clear and Coherent  Affect: Congruent  Mood: normal  Thought process: normal  Thought content:   WNL  Sensory/Perceptual disturbances:   WNL  Orientation: oriented to person, place, time/date, and situation  Attention: Good  Concentration: Good  Memory: WNL  Fund of knowledge:  Good  Insight:   Good  Judgment:  Good  Impulse Control: Good   Risk Assessment: Danger to Self:  No Self-injurious Behavior: No Danger to Others: No Duty to Warn:no Physical Aggression / Violence:No  Access to Firearms a concern: No  Gang Involvement:No   Subjective:   Brandy [name] participated from car, via video and consented to treatment. Therapist participated from home office. We met online due to [place] pandemic. Brandy [name] the events of the past week. We [name] numerous treatment approaches including CBT, BA, Problem Solving, and Solution focused therapy. Psych-education regarding the Brandy [name]'s diagnosis of Episode of recurrent major depressive disorder, unspecified depression episode severity ([place]) was provided during the session. We discussed Brandy [name]'s goals treatment goals which include focus on [name], eating healthfully, engaging in consistent exercise, focusing on sense of self,  being more organized and goal oriented, process past events including childhood, reduce negative self-talk. Brandy [name] provided verbal approval of the treatment plan.   She provided feedback regarding an experience with Ed this week and noted a lack of expressed regard for a dental appointment. She noted the lack of support in this area making  it "hard" to invest in the relationship. We will explore this doing forward.   Interventions: Psycho-education & Goal Setting.   Diagnosis:  Episode of recurrent major depressive disorder, unspecified depression episode severity [facility])  Psychiatric Treatment: Yes , Brandy [name].    Treatment Plan:  [name], self-aware, and motivated for change.   Support System: Family and friends.    Client Treatment Preferences Outpatient therapy.   Client Statement of Needs Brandy [name], eating healthfully, engaging in consistent exercise, focusing on sense of self,  being more organized and goal oriented, process past events including childhood, reduce negative self-talk.   Treatment Level Weekly  Symptoms  Depression: loss of interest, feeling down, poor sleep, lethargy, fluctuating appetite, feeling bad about self, and trouble concentrating.    (Status: maintained) Anxiety:  feeling anxious, difficulty managing worry, worrying about different things, trouble relaxing, restlessness, and feeling afraid as if something awful might happen.   (Status: maintained)  Goals:   Kesa experiences symptoms of depression and anxiety.    Target Date: 07/19/23 Frequency: Weekly  Progress: 0 Modality: individual    Therapist will provide referrals for additional resources as appropriate.  Therapist will provide psycho-education regarding Brandy [name]'s diagnosis and corresponding treatment approaches and interventions. Licensed Clinical Social Worker, [place], LCSW will support the patient's ability to achieve the goals identified. will employ CBT, BA, Problem-solving, Solution Focused, Mindfulness,  coping skills, & other evidenced-based practices will be used to promote progress towards healthy functioning to help manage decrease symptoms associated with her diagnosis.   Reduce overall level, frequency, and intensity of the feelings of  depression & anxiety and evidenced  by decreased overall symptoms  from 6 to 7 days/week to 0 to 1 days/week per client report for at least 3 consecutive months. Verbally express understanding of the relationship between feelings of depression, anxiety and their impact on thinking patterns and behaviors. Verbalize an understanding of the role that distorted thinking plays in creating fears, excessive worry, and ruminations.    [name] participated in the creation of the treatment plan)    [name], LCSW

## 2022-07-20 ENCOUNTER — Ambulatory Visit
Admission: RE | Admit: 2022-07-20 | Discharge: 2022-07-20 | Disposition: A | Discharge status: Home / Self Care | Payer: HMO | Source: Ambulatory Visit | Attending: Primary Care | Admitting: Primary Care

## 2022-07-20 DIAGNOSIS — Z1231 Encounter for screening mammogram for malignant neoplasm of breast: Secondary | ICD-10-CM | POA: Diagnosis not present

## 2022-07-25 ENCOUNTER — Ambulatory Visit: Payer: HMO | Attending: Family Medicine

## 2022-07-25 DIAGNOSIS — M7581 Other shoulder lesions, right shoulder: Secondary | ICD-10-CM | POA: Insufficient documentation

## 2022-07-25 DIAGNOSIS — M25611 Stiffness of right shoulder, not elsewhere classified: Secondary | ICD-10-CM | POA: Insufficient documentation

## 2022-07-25 DIAGNOSIS — M25511 Pain in right shoulder: Secondary | ICD-10-CM | POA: Insufficient documentation

## 2022-07-25 DIAGNOSIS — M542 Cervicalgia: Secondary | ICD-10-CM | POA: Insufficient documentation

## 2022-07-25 DIAGNOSIS — G8929 Other chronic pain: Secondary | ICD-10-CM | POA: Insufficient documentation

## 2022-07-25 DIAGNOSIS — M7541 Impingement syndrome of right shoulder: Secondary | ICD-10-CM | POA: Diagnosis not present

## 2022-07-25 NOTE — Therapy (Signed)
[facility] [address] [place], [place], [address] Phone: [phone]   Fax:  [phone]  Physical Therapy Evaluation  Patient Details  Name: Brandy [name] MRN: [id number] Date of Birth: 1967/09/59 Referring Provider (PT): Owens Loffler, MD   Encounter Date: 07/25/2022 57   PT End of Session - 07/25/22 1507     Visit Number 1    Number of Visits 17    Date for PT Re-Evaluation 09/20/22    PT Start Time [id number]    PT Stop Time 1549    PT Time Calculation (min) 42 min    Activity Tolerance Patient tolerated treatment well    Behavior During Therapy [facility] for tasks assessed/performed             Past Medical History:  Diagnosis Date   Anxiety    Arthritis    Back pain    Brain fog    CHF (congestive heart failure) ([place])    pt not aware of this   Depression    Diabetes mellitus without complication (HCC)    Difficulty walking    Dysmetabolic syndrome X    Fatigue    Fibromyalgia    neuropathy   Hypertension    Hypertriglyceridemia    Joint pain    Leg weakness    Lower extremity edema    Muscle atrophy    Neuropathy    Obesity    Osteoarthritis    PONV (postoperative nausea and vomiting)    Poor memory    Post-menopausal bleeding    Prediabetes    Rosacea    Sleep apnea    not on cpap yet   Tetanus vaccine causing adverse effect in therapeutic use    Vitamin D deficiency     Past Surgical History:  Procedure Laterality Date   BONE TUMOR EXCISION  1975   Right leg   DILATATION & CURETTAGE/HYSTEROSCOPY WITH MYOSURE N/A 10/07/2019   Procedure: DILATATION AND CURETTAGE /HYSTEROSCOPY, Myosure, Polypectomy;  Surgeon: Aletha Halim, MD;  Location: [place] [facility];  Service: Gynecology;  Laterality: N/A;   ENDOMETRIAL BIOPSY  07/14/2019       epidural steroid injection     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   Procedure: LAPAROSCOPIC ROUX-EN-Y GASTRIC BYPASS WITH UPPER ENDOSCOPY;  Surgeon: Kieth Brightly, [name] Bruce,  MD;  Location: [facility];  Service: General;  Laterality: N/A;    There were no vitals filed for this visit.    Subjective Assessment - 07/25/22 1514     Subjective R shoulder pain: 1/10 currently R upper trap and posterior R shoulder, 6/10 at worst for the past 3 months.    Pertinent History M75.41 (ICD-10-CM) - Impingement syndrome of right shoulder  M75.81 (ICD-10-CM) - Rotator cuff tendonitis, right. Pain would be at posterior R shoulder, upper trap area, R shoulder joint, and R arm. Got a cortisone shot on 07/11/2022 which helped. Pain began about 2 years ago, gradual onset. Pt is R hand dominant. Pain has overall worsened since onset, but the cortisone shot provided some relief. Pt also reports R lateral neck pain.    Patient Stated Goals Strengthen the muscles needed to stop the pain.    Currently in Pain? Yes    Pain Score 1    Pt had a cortisone shot about 2 weeks ago.   Pain Location Shoulder    Pain Orientation Right    Pain Descriptors / Indicators Aching;Nagging;Sore;Constant;Tender    Pain Type Chronic pain    Pain  Radiating Towards R lateral arm, R upper trap area    Pain Onset More than a month ago    Pain Frequency Constant    Aggravating Factors  sleeping, R UE movement, reaching forward, up, behind back, behind head    Pain Relieving Factors rest, cortisone shot                [facility] PT Assessment - 07/25/22 1512       Assessment   Medical Diagnosis M75.41 (ICD-10-CM) - Impingement syndrome of right shoulder  M75.81 (ICD-10-CM) - Rotator cuff tendonitis, right    Referring Provider (PT) Owens Loffler, MD    Onset Date/Surgical Date 07/11/22   Date PT referral signed.   Hand Dominance Right 57      Precautions   Precaution Comments No known precautions      Restrictions   Other Position/Activity Restrictions No known restrictions      Balance Screen   Has the patient fallen in the past 6 months Yes    How many times? 2   Sometimes get off balance. Tripped  one time (did not see a step going down).   Has the patient had a decrease in activity level because of a fear of falling?  No    Is the patient reluctant to leave their home because of a fear of falling?  No      Posture/Postural Control   Posture Comments Forward neck, B protracted shoulders, L shoulder lower, slight uppr thoracic kyphosis, slight R lateral shift/L thoracoclumbar convexity.      AROM   Overall AROM Comments Decreased R shoulder pain with flexion with addition of scapular retraction and posterior tipping.    Right Shoulder Flexion 117 Degrees   130 degrees AAROM with R posterior upper trap area pain, stiff end feel   Right Shoulder ABduction 128 Degrees   148 AAROM with R posterior upper trap area pain, and stiff end feel   Right Shoulder Internal Rotation --   Functional IR:  R thumb to T10 spinous process with R anterior shouldre pain.   Right Shoulder External Rotation --   Functional ER R 3rd digit to C6 Spinous process.   Cervical Flexion full wiht R upper trap area pain (C5 dermatome), similar to R shoulder pain    Cervical Extension WFL with R posterior upper trap area pain with overpressure    Cervical - Right Side [facility] wiht R lateral neck and upper trap area pain.    Cervical - Left Side Bend WFL R lateral neck stretch    Cervical - Right Rotation WFL with R posterior upper trap area pain with over pressure    Cervical - Left Rotation [facility]      Strength   Right Shoulder Flexion 4+/5    Right Shoulder ABduction 4+/5   with R arm and shoulder pain   Right Shoulder Internal Rotation 4/5    Right Shoulder External Rotation 4/5   With R shoulder pain     Palpation   Palpation comment Pressure to R upper trap feels better per pt.     TTP R distal scalene area      Special Tests   Other special tests (+) empty can, Michel Bickers                        Objective measurements completed on examination: See above findings.   Blood pressure is  controlled.  DM controlled.   No  latex allergies.   Pressure to R upper trap feels better per pt.   TTP R distal scalene area    Response to treatment Pt tolerated session well without aggravation of symptoms.    Clinical impression Pt is a 57 year old female who came to physical therapy secondary to R shoulder pain. She also presents with altered posture, positive special tests suggesting R shoulder impingement with supraspinatus involvement, reproduction of symptoms with cervical AROM suggesting cervical involvement, scapular and R shoulder weakness, and difficulty performing tasks which involve reaching up, and reaching back secondary to pain. Pt will benefit from skilled physical therapy services to address the aforementioned deficits.                   PT Education - 07/25/22 1821     Education Details POC    Person(s) Educated Patient    Methods Explanation    Comprehension Verbalized understanding              PT Short Term Goals - 07/25/22 1702       PT SHORT TERM GOAL #1   Title Pt will be independent with her initial HEP to decrease pain, improve strength, and ability to reach up and behind her more comfortably.    Time 3    Period Weeks    Status New    Target Date 08/16/22               PT Long Term Goals - 07/25/22 1703       PT LONG TERM GOAL #1   Title Pt will have a decrease in R shoulder pain to 3/10 or less at worst to promote ability to reach up, reach behind her, as well as lay on her R side to sleep more comfortably.    Baseline 6/10 R shoulder pain at worst for the past 3 months (07/25/2022)    Time 8    Period Weeks    Status New    Target Date 09/20/22      PT LONG TERM GOAL #2   Title Pt will improve R shoulder FOTO score by at least 10 points as a demonstration of improved function.    Baseline R shoulder FOTO 54 (07/25/2022)    Time 3    Period Weeks    Status New    Target Date 09/20/22      PT LONG TERM GOAL  #3   Title Pt will improve R shoulder IR and ER strength by at least 1/2 MMT grade to promote ability to reach more comfortably.    Baseline R shoulder IR 4/5, ER 4/5 (07/25/2022)    Time 8    Period Weeks    Status New    Target Date 09/20/22      PT LONG TERM GOAL #4   Title Pt will improve R shoulder AROM to at least 150 degrees flexion and abduction to promote ability to reach more comfortably.    Baseline R shoulder AROM 117 degrees flexion, 128 degrees abduction (07/25/2022)    Time 8    Period Weeks    Status New    Target Date 09/20/22      PT LONG TERM GOAL #5   Title Pt will improve R shoulder functional IR to R thumb to T7 spinous process to promote ability to don and doff clothes as well s perform self care more comfortably.    Baseline R shoulder functional IR: R thumb to T10 spinous  process (07/25/2022)    Time 8    Period Weeks    Status New    Target Date 09/20/22                    Plan - 07/25/22 1658     Clinical Impression Statement Pt is a 57 year old female who came to physical therapy secondary to R shoulder pain. She also presents with altered posture, positive special tests suggesting R shoulder impingement with supraspinatus involvement, reproduction of symptoms with cervical AROM suggesting cervical involvement, scapular and R shoulder weakness, and difficulty performing tasks which involve reaching up, and reaching back secondary to pain. Pt will benefit from skilled physical therapy services to address the aforementioned deficits.    Personal Factors and Comorbidities Comorbidity 3+;Fitness;Past/Current Experience;Time since onset of injury/illness/exacerbation    Comorbidities Anxiety, back pain, arthritis, CHF, depression, HTN, DM, fibromyalgia, neuropathy    Examination-Activity Limitations Bathing;Reach Overhead;Bed Mobility;Dressing;Hygiene/Grooming;Toileting;Lift;Sleep    Stability/Clinical Decision Making Evolving/Moderate complexity   Pt  states pain is worsening overall since onset   Clinical Decision Making Moderate    Rehab Potential Fair    PT Frequency 2x / week    PT Duration 8 weeks    PT Treatment/Interventions Therapeutic activities;Functional mobility training;Therapeutic exercise;Neuromuscular re-education;Patient/family education;Manual techniques;Dry needling;Joint Manipulations;Spinal Manipulations;Electrical Stimulation;Iontophoresis 4mg /ml Dexamethasone;Traction   traction and/or manipulations if appropriate   PT Next Visit Plan thoracic extension, scapular, anterior cervical, ER, IR strengthening, manual techniques, modalities PRN    Consulted and Agree with Plan of Care Patient             Patient will benefit from skilled therapeutic intervention in order to improve the following deficits and impairments:  Pain, Postural dysfunction, Improper body mechanics, Impaired UE functional use, Decreased strength, Decreased range of motion  Visit Diagnosis: Chronic right shoulder pain - Plan: PT plan of care cert/re-cert  Cervicalgia - Plan: PT plan of care cert/re-cert  Stiffness of right shoulder, not elsewhere classified - Plan: PT plan of care cert/re-cert     Problem List Patient Active Problem List   Diagnosis Date Noted   GERD (gastroesophageal reflux disease) 07/03/2022   Preventative health care 07/03/2022   S/P gastric bypass 06/20/2021   Morbid (severe) obesity due to excess calories ([place]) 04/04/2021   Near syncope 10/04/2020   Severe obstructive sleep apnea-hypopnea syndrome 09/23/2019   Class 2 obesity due to excess calories with body mass index (BMI) of 38.0 to 38.9 in adult 09/23/2019   Excessive daytime sleepiness [id number]   Acute diastolic congestive heart failure ([place]) 08/07/2019   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 07/07/2019   Post-menopausal bleeding 06/17/2019   Vaginal burning 06/17/2019   Urinary incontinence 06/17/2019   Lower extremity weakness  05/13/2019   Poor short term memory 05/13/2019   Left groin pain 03/18/2019   Rosacea 08/25/2018   Chronic venous insufficiency 08/07/2018   PAD (peripheral artery disease) ([place]) 08/07/2018   Chronic bilateral low back pain with left-sided sciatica 07/04/2018   Vitamin B12 deficiency 05/23/2018   Arthralgia of multiple sites 05/01/2018   Abnormal TSH 03/17/2018   GAD (generalized anxiety disorder) 02/24/2018   Vitamin D deficiency 02/24/2018   Bilateral hand pain 04/06/2014   Bilateral wrist pain 04/06/2014   Chronic fatigue 03/15/2014   Palpitations 09/20/2010   Essential hypertension, benign 09/14/2010   HYPERTRIGLYCERIDEMIA 10/04/2009   Lower extremity pain 10/04/2009   Type 2 diabetes mellitus with hyperglycemia ([place]) 08/12/2009   OBESITY, MORBID 08/12/2009  MDD (major depressive disorder) 08/12/2009   Joneen Boers PT, DPT  07/25/2022, 6:28 PM  [facility] [address] [place], [address] Phone: [phone]   Fax:  [phone]  Name: TIFANY HAGLEY MRN: [id number] Date of Birth: 1966-04-03

## 2022-07-26 ENCOUNTER — Ambulatory Visit: Payer: Medicare Other | Admitting: Psychology

## 2022-07-30 ENCOUNTER — Other Ambulatory Visit: Payer: Self-pay | Admitting: Primary Care

## 2022-07-30 ENCOUNTER — Ambulatory Visit: Payer: HMO | Attending: Family Medicine

## 2022-07-30 DIAGNOSIS — G8929 Other chronic pain: Secondary | ICD-10-CM | POA: Diagnosis not present

## 2022-07-30 DIAGNOSIS — M25611 Stiffness of right shoulder, not elsewhere classified: Secondary | ICD-10-CM | POA: Diagnosis not present

## 2022-07-30 DIAGNOSIS — M542 Cervicalgia: Secondary | ICD-10-CM | POA: Diagnosis not present

## 2022-07-30 DIAGNOSIS — J302 Other seasonal allergic rhinitis: Secondary | ICD-10-CM

## 2022-07-30 DIAGNOSIS — M25511 Pain in right shoulder: Secondary | ICD-10-CM | POA: Diagnosis not present

## 2022-07-30 NOTE — Therapy (Signed)
OUTPATIENT PHYSICAL THERAPY TREATMENT NOTE   Patient Name: Brandy [name] MRN: [id number] DOB:1966/02/02, 57 y.o., female Today's Date: 07/30/2022  PCP: Pleas Koch, NP  REFERRING PROVIDER: Owens Loffler, MD  END OF SESSION:  PT End of Session - 07/30/22 1634     Visit Number 2    Number of Visits 17    Date for PT Re-Evaluation 09/20/22    PT Start Time 1634    PT Stop Time [id number]    PT Time Calculation (min) 43 min    Activity Tolerance Patient tolerated treatment well    Behavior During Therapy WFL for tasks assessed/performed             Past Medical History:  Diagnosis Date   Anxiety    Arthritis    Back pain    Brain fog    CHF (congestive heart failure) ([place])    pt not aware of this   Depression    Diabetes mellitus without complication (HCC)    Difficulty walking    Dysmetabolic syndrome X    Fatigue    Fibromyalgia    neuropathy   Hypertension    Hypertriglyceridemia    Joint pain    Leg weakness    Lower extremity edema    Muscle atrophy    Neuropathy    Obesity    Osteoarthritis    PONV (postoperative nausea and vomiting)    Poor memory    Post-menopausal bleeding    Prediabetes    Rosacea    Sleep apnea    not on cpap yet   Tetanus vaccine causing adverse effect in therapeutic use    Vitamin D deficiency    Past Surgical History:  Procedure Laterality Date   BONE TUMOR EXCISION  1975   Right leg   DILATATION & CURETTAGE/HYSTEROSCOPY WITH MYOSURE N/A 10/07/2019   Procedure: DILATATION AND CURETTAGE /HYSTEROSCOPY, Myosure, Polypectomy;  Surgeon: Aletha Halim, MD;  Location: [place];  Service: Gynecology;  Laterality: N/A;   ENDOMETRIAL BIOPSY  07/14/2019       epidural steroid injection     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   Procedure: LAPAROSCOPIC ROUX-EN-Y GASTRIC BYPASS WITH UPPER ENDOSCOPY;  Surgeon: Kieth Brightly, [name] Bruce, MD;  Location: [facility];  Service: General;  Laterality: N/A;   Patient Active Problem List   Diagnosis  Date Noted   GERD (gastroesophageal reflux disease) 07/03/2022   Preventative health care 07/03/2022   S/P gastric bypass 06/20/2021   Morbid (severe) obesity due to excess calories 04/04/2021   Near syncope 10/04/2020   Severe obstructive sleep apnea-hypopnea syndrome 09/23/2019   Class 2 obesity due to excess calories with body mass index (BMI) of 38.0 to 38.9 in adult 09/23/2019   Excessive daytime sleepiness 123XX123   Acute diastolic congestive heart failure 08/07/2019   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 07/07/2019   Post-menopausal bleeding 06/17/2019   Vaginal burning 06/17/2019   Urinary incontinence 06/17/2019   Lower extremity weakness 05/13/2019   Poor short term memory 05/13/2019   Left groin pain 03/18/2019   Rosacea 08/25/2018   Chronic venous insufficiency 08/07/2018   PAD (peripheral artery disease) 08/07/2018   Chronic bilateral low back pain with left-sided sciatica 07/04/2018   Vitamin B12 deficiency 05/23/2018   Arthralgia of multiple sites 05/01/2018   Abnormal TSH 03/17/2018   GAD (generalized anxiety disorder) 02/24/2018   Vitamin D deficiency 02/24/2018   Bilateral hand pain 04/06/2014   Bilateral wrist pain 04/06/2014   Chronic  fatigue 03/15/2014   Palpitations 09/20/2010   Essential hypertension, benign 09/14/2010   HYPERTRIGLYCERIDEMIA 10/04/2009   Lower extremity pain 10/04/2009   Type 2 diabetes mellitus with hyperglycemia 08/12/2009   OBESITY, MORBID 08/12/2009   MDD (major depressive disorder) 08/12/2009    REFERRING DIAG: M75.41 (ICD-10-CM) - Impingement syndrome of right shoulder M75.81 (ICD-10-CM) - Rotator cuff tendonitis, right   THERAPY DIAG:  Chronic right shoulder pain  Cervicalgia  Stiffness of right shoulder, not elsewhere classified  Rationale for Evaluation and Treatment Rehabilitation  PERTINENT HISTORY: M75.41 (ICD-10-CM) - Impingement syndrome of right shoulder M75.81 (ICD-10-CM) - Rotator cuff  tendonitis, right. Pain would be at posterior R shoulder, upper trap area, R shoulder joint, and R arm. Got a cortisone shot on 07/11/2022 which helped. Pain began about 2 years ago, gradual onset. Pt is R hand dominant. Pain has overall worsened since onset, but the cortisone shot provided some relief. Pt also reports R lateral neck pain.   PRECAUTIONS: No known precautions   SUBJECTIVE:   SUBJECTIVE STATEMENT: R shoulder is ok, 3/10 currently (posterior upper trap area)  PAIN:  Are you having pain? See subjective   TODAY'S TREATMENT:                                                                                                                                         DATE: 07/30/2022  Blood pressure is controlled.  DM controlled.    No latex allergies.    Pressure to R upper trap feels better per pt.    TTP R distal scalene area    Therapeutic exercises  Seated B scapular retraction 10x3 for 5 seconds   Supine chin tucks 10x3 with 5 second holds  Supine cervical nod 10x5 seconds for 2 sets  Supine shoulder flexion with scapular retraction 10x3 to 90 degrees  Seated B shoulder ER yellow band, pain free range 10x2   Increased time secondary to emphasis on proper movement and answering pt questions     Improved exercise technique, movement at target joints, use of target muscles after mod verbal, visual, tactile cues.        Response to treatment Fair tolerance to today's session. Feels like her shoulder was worked out.      Clinical impression Worked on improving anterior cervical muscle strengthening, thoracic extension, and scapular strengthening to decrease stress to lower cervical spine and improve R glenohumeral mechanics. Also worked on infraspinatus muscle strengthening to promote better intraarticular movement when raising her arm. Fair tolerance to today's session. Pt will benefit from continued skilled physical therapy services to decrease pain, improve  strength, AROM, and function.        PATIENT EDUCATION: Education details: there-ex, HEP Person educated: Patient Education method: Explanation, Demonstration, Tactile cues, Verbal cues, and Handouts Education comprehension: verbalized understanding and returned demonstration  HOME EXERCISE PROGRAM: Access Code: [email or web address] URL: [email or web address] Date: 07/30/2022 Prepared by:  [facility]  Exercises - Seated Scapular Retraction  - 2 x daily - 7 x weekly - 3 sets - 10 reps - 5 seconds hold - Supine Cervical Retraction with Towel  - 2 x daily - 7 x weekly - 3 sets - 10 reps - 5 seconds hold  - Supine Cervical Retraction with Towel  - 2 x daily - 7 x weekly - 3 sets - 10 reps - 5 seconds hold      PT Short Term Goals - 07/25/22 1702       PT SHORT TERM GOAL #1   Title Pt will be independent with her initial HEP to decrease pain, improve strength, and ability to reach up and behind her more comfortably.    Time 3    Period Weeks    Status New    Target Date 08/16/22              PT Long Term Goals - 07/25/22 1703       PT LONG TERM GOAL #1   Title Pt will have a decrease in R shoulder pain to 3/10 or less at worst to promote ability to reach up, reach behind her, as well as lay on her R side to sleep more comfortably.    Baseline 6/10 R shoulder pain at worst for the past 3 months (07/25/2022)    Time 8    Period Weeks    Status New    Target Date 09/20/22      PT LONG TERM GOAL #2   Title Pt will improve R shoulder FOTO score by at least 10 points as a demonstration of improved function.    Baseline R shoulder FOTO 54 (07/25/2022)    Time 3    Period Weeks    Status New    Target Date 09/20/22      PT LONG TERM GOAL #3   Title Pt will improve R shoulder IR and ER strength by at least 1/2 MMT grade to promote ability to reach more comfortably.    Baseline R shoulder IR 4/5, ER 4/5 (07/25/2022)    Time 8    Period Weeks    Status New     Target Date 09/20/22      PT LONG TERM GOAL #4   Title Pt will improve R shoulder AROM to at least 150 degrees flexion and abduction to promote ability to reach more comfortably.    Baseline R shoulder AROM 117 degrees flexion, 128 degrees abduction (07/25/2022)    Time 8    Period Weeks    Status New    Target Date 09/20/22      PT LONG TERM GOAL #5   Title Pt will improve R shoulder functional IR to R thumb to T7 spinous process to promote ability to don and doff clothes as well s perform self care more comfortably.    Baseline R shoulder functional IR: R thumb to T10 spinous process (07/25/2022)    Time 8    Period Weeks    Status New    Target Date 09/20/22              Plan - 07/30/22 1634     Clinical Impression Statement Worked on improving anterior cervical muscle strengthening, thoracic extension, and scapular strengthening to decrease stress to lower cervical spine and improve R glenohumeral mechanics. Also worked on infraspinatus muscle strengthening to promote better intraarticular movement when raising her arm. Fair tolerance to today's session.  Pt will benefit from continued skilled physical therapy services to decrease pain, improve strength, AROM, and function.    Personal Factors and Comorbidities Comorbidity 3+;Fitness;Past/Current Experience;Time since onset of injury/illness/exacerbation    Comorbidities Anxiety, back pain, arthritis, CHF, depression, HTN, DM, fibromyalgia, neuropathy    Examination-Activity Limitations Bathing;Reach Overhead;Bed Mobility;Dressing;Hygiene/Grooming;Toileting;Lift;Sleep    Stability/Clinical Decision Making Evolving/Moderate complexity   Pt states pain is worsening overall since onset   Rehab Potential Fair    PT Frequency 2x / week    PT Duration 8 weeks    PT Treatment/Interventions Therapeutic activities;Functional mobility training;Therapeutic exercise;Neuromuscular re-education;Patient/family education;Manual techniques;Dry  needling;Joint Manipulations;Spinal Manipulations;Electrical Stimulation;Iontophoresis 4mg /ml Dexamethasone;Traction   traction and/or manipulations if appropriate   PT Next Visit Plan thoracic extension, scapular, anterior cervical, ER, IR strengthening, manual techniques, modalities PRN    Consulted and Agree with Plan of Care Patient              Joneen Boers PT, DPT     07/30/2022, 5:36 PM

## 2022-08-01 ENCOUNTER — Ambulatory Visit: Payer: HMO

## 2022-08-01 DIAGNOSIS — M542 Cervicalgia: Secondary | ICD-10-CM

## 2022-08-01 DIAGNOSIS — M25511 Pain in right shoulder: Secondary | ICD-10-CM | POA: Diagnosis not present

## 2022-08-01 DIAGNOSIS — M25611 Stiffness of right shoulder, not elsewhere classified: Secondary | ICD-10-CM

## 2022-08-01 DIAGNOSIS — G8929 Other chronic pain: Secondary | ICD-10-CM

## 2022-08-01 NOTE — Therapy (Signed)
OUTPATIENT PHYSICAL THERAPY TREATMENT NOTE   Patient Name: Brandy [name] MRN: [id number] DOB:1965/12/24, 57 y.o., female Today's Date: 08/01/2022  PCP: Pleas Koch, NP  REFERRING PROVIDER: Owens Loffler, MD  END OF SESSION:  PT End of Session - 08/01/22 1639     Visit Number 3    Number of Visits 17    Date for PT Re-Evaluation 09/20/22    PT Start Time 1639   pt arrived late   PT Stop Time 1720    PT Time Calculation (min) 41 min    Activity Tolerance Patient tolerated treatment well    Behavior During Therapy WFL for tasks assessed/performed              Past Medical History:  Diagnosis Date   Anxiety    Arthritis    Back pain    Brain fog    CHF (congestive heart failure) ([place])    pt not aware of this   Depression    Diabetes mellitus without complication (HCC)    Difficulty walking    Dysmetabolic syndrome X    Fatigue    Fibromyalgia    neuropathy   Hypertension    Hypertriglyceridemia    Joint pain    Leg weakness    Lower extremity edema    Muscle atrophy    Neuropathy    Obesity    Osteoarthritis    PONV (postoperative nausea and vomiting)    Poor memory    Post-menopausal bleeding    Prediabetes    Rosacea    Sleep apnea    not on cpap yet   Tetanus vaccine causing adverse effect in therapeutic use    Vitamin D deficiency    Past Surgical History:  Procedure Laterality Date   BONE TUMOR EXCISION  1975   Right leg   DILATATION & CURETTAGE/HYSTEROSCOPY WITH MYOSURE N/A 10/07/2019   Procedure: DILATATION AND CURETTAGE /HYSTEROSCOPY, Myosure, Polypectomy;  Surgeon: Aletha Halim, MD;  Location: [place];  Service: Gynecology;  Laterality: N/A;   ENDOMETRIAL BIOPSY  07/14/2019       epidural steroid injection     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   Procedure: LAPAROSCOPIC ROUX-EN-Y GASTRIC BYPASS WITH UPPER ENDOSCOPY;  Surgeon: Kieth Brightly, [name] Bruce, MD;  Location: [facility];  Service: General;  Laterality: N/A;   Patient Active  Problem List   Diagnosis Date Noted   GERD (gastroesophageal reflux disease) 07/03/2022   Preventative health care 07/03/2022   S/P gastric bypass 06/20/2021   Morbid (severe) obesity due to excess calories 04/04/2021   Near syncope 10/04/2020   Severe obstructive sleep apnea-hypopnea syndrome 09/23/2019   Class 2 obesity due to excess calories with body mass index (BMI) of 38.0 to 38.9 in adult 09/23/2019   Excessive daytime sleepiness [id number]   Acute diastolic congestive heart failure 08/07/2019   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 07/07/2019   Post-menopausal bleeding 06/17/2019   Vaginal burning 06/17/2019   Urinary incontinence 06/17/2019   Lower extremity weakness 05/13/2019   Poor short term memory 05/13/2019   Left groin pain 03/18/2019   Rosacea 08/25/2018   Chronic venous insufficiency 08/07/2018   PAD (peripheral artery disease) 08/07/2018   Chronic bilateral low back pain with left-sided sciatica 07/04/2018   Vitamin B12 deficiency 05/23/2018   Arthralgia of multiple sites 05/01/2018   Abnormal TSH 03/17/2018   GAD (generalized anxiety disorder) 02/24/2018   Vitamin D deficiency 02/24/2018   Bilateral hand pain 04/06/2014   Bilateral wrist  pain 04/06/2014   Chronic fatigue 03/15/2014   Palpitations 09/20/2010   Essential hypertension, benign 09/14/2010   HYPERTRIGLYCERIDEMIA 10/04/2009   Lower extremity pain 10/04/2009   Type 2 diabetes mellitus with hyperglycemia 08/12/2009   OBESITY, MORBID 08/12/2009   MDD (major depressive disorder) 08/12/2009    REFERRING DIAG: M75.41 (ICD-10-CM) - Impingement syndrome of right shoulder M75.81 (ICD-10-CM) - Rotator cuff tendonitis, right   THERAPY DIAG:  Chronic right shoulder pain  Cervicalgia  Stiffness of right shoulder, not elsewhere classified  Rationale for Evaluation and Treatment Rehabilitation  PERTINENT HISTORY: M75.41 (ICD-10-CM) - Impingement syndrome of right shoulder M75.81  (ICD-10-CM) - Rotator cuff tendonitis, right. Pain would be at posterior R shoulder, upper trap area, R shoulder joint, and R arm. Got a cortisone shot on 07/11/2022 which helped. Pain began about 2 years ago, gradual onset. Pt is R hand dominant. Pain has overall worsened since onset, but the cortisone shot provided some relief. Pt also reports R lateral neck pain.   PRECAUTIONS: No known precautions   SUBJECTIVE:   SUBJECTIVE STATEMENT: R shoulder does not feel bad at all. Was sore yesterday, better today. No pain currently. Pt also states having back pain and a weak core. Difficulty standing for long periods to wash dishes due to back pain.     PAIN:  Are you having pain? See subjective   TODAY'S TREATMENT:                                                                                                                                         DATE: 08/01/2022  Blood pressure is controlled.  DM controlled.    No latex allergies.    Pressure to R upper trap feels better per pt.    TTP R distal scalene area    Therapeutic exercises  Standing exercises: transversus abdominis and glute max muscle activation:   Seated B scapular retraction 10x3 for 5 seconds   standing B shoulder ER yellow band, pain free range 10x3   Standing R shoulder IR isometrics, hand pressing on abdomen 10x5 seconds for 2 sets  seated chin tucks 10x3 with 5 second holds  Seated thoracic extension at chair 10x5 seconds  Seated manually resisted scapular retraction targeting lower trap   R 10x5 seconds for 2 sets    Improved exercise technique, movement at target joints, use of target muscles after mod verbal, visual, tactile cues.        Response to treatment  Feels like her shoulder was worked out reported after session.      Clinical impression  Pt returns to clinic with no starting R shoulder pain. Continued working on scapular strengthening, thoracic extension, anterior cervical muscle  strengthening, ER and IR muscle strengthening to improve glenohumeral mechanics as well as decreasing stress to neck and shoulder joints.  Fair tolerance to today's session. Pt will benefit from continued skilled physical therapy  services to decrease pain, improve strength, AROM, and function.        PATIENT EDUCATION: Education details: there-ex, HEP Person educated: Patient Education method: Explanation, Demonstration, Tactile cues, Verbal cues, and Handouts Education comprehension: verbalized understanding and returned demonstration  HOME EXERCISE PROGRAM: Access Code: [email or web address] URL: [email or web address] Date: 07/30/2022 Prepared by: Joneen Boers  Exercises - Seated Scapular Retraction  - 2 x daily - 7 x weekly - 3 sets - 10 reps - 5 seconds hold - Supine Cervical Retraction with Towel  - 2 x daily - 7 x weekly - 3 sets - 10 reps - 5 seconds hold - Standing Shoulder External Rotation with Resistance  - 1 x daily - 7 x weekly - 3 sets - 10 reps     PT Short Term Goals - 07/25/22 1702       PT SHORT TERM GOAL #1   Title Pt will be independent with her initial HEP to decrease pain, improve strength, and ability to reach up and behind her more comfortably.    Time 3    Period Weeks    Status New    Target Date 08/16/22              PT Long Term Goals - 07/25/22 1703       PT LONG TERM GOAL #1   Title Pt will have a decrease in R shoulder pain to 3/10 or less at worst to promote ability to reach up, reach behind her, as well as lay on her R side to sleep more comfortably.    Baseline 6/10 R shoulder pain at worst for the past 3 months (07/25/2022)    Time 8    Period Weeks    Status New    Target Date 09/20/22      PT LONG TERM GOAL #2   Title Pt will improve R shoulder FOTO score by at least 10 points as a demonstration of improved function.    Baseline R shoulder FOTO 54 (07/25/2022)    Time 3    Period Weeks    Status New    Target Date 09/20/22       PT LONG TERM GOAL #3   Title Pt will improve R shoulder IR and ER strength by at least 1/2 MMT grade to promote ability to reach more comfortably.    Baseline R shoulder IR 4/5, ER 4/5 (07/25/2022)    Time 8    Period Weeks    Status New    Target Date 09/20/22      PT LONG TERM GOAL #4   Title Pt will improve R shoulder AROM to at least 150 degrees flexion and abduction to promote ability to reach more comfortably.    Baseline R shoulder AROM 117 degrees flexion, 128 degrees abduction (07/25/2022)    Time 8    Period Weeks    Status New    Target Date 09/20/22      PT LONG TERM GOAL #5   Title Pt will improve R shoulder functional IR to R thumb to T7 spinous process to promote ability to don and doff clothes as well s perform self care more comfortably.    Baseline R shoulder functional IR: R thumb to T10 spinous process (07/25/2022)    Time 8    Period Weeks    Status New    Target Date 09/20/22              Plan - 08/01/22 1639  Clinical Impression Statement Pt returns to clinic with no starting R shoulder pain. Continued working on scapular strengthening, thoracic extension, anterior cervical muscle strengthening, ER and IR muscle strengthening to improve glenohumeral mechanics as well as decreasing stress to neck and shoulder joints.  Fair tolerance to today's session. Pt will benefit from continued skilled physical therapy services to decrease pain, improve strength, AROM, and function.    Personal Factors and Comorbidities Comorbidity 3+;Fitness;Past/Current Experience;Time since onset of injury/illness/exacerbation    Comorbidities Anxiety, back pain, arthritis, CHF, depression, HTN, DM, fibromyalgia, neuropathy    Examination-Activity Limitations Bathing;Reach Overhead;Bed Mobility;Dressing;Hygiene/Grooming;Toileting;Lift;Sleep    Stability/Clinical Decision Making Evolving/Moderate complexity   Pt states pain is worsening overall since onset   Rehab Potential  Fair    PT Frequency 2x / week    PT Duration 8 weeks    PT Treatment/Interventions Therapeutic activities;Functional mobility training;Therapeutic exercise;Neuromuscular re-education;Patient/family education;Manual techniques;Dry needling;Joint Manipulations;Spinal Manipulations;Electrical Stimulation;Iontophoresis 4mg /ml Dexamethasone;Traction   traction and/or manipulations if appropriate   PT Next Visit Plan thoracic extension, scapular, anterior cervical, ER, IR strengthening, manual techniques, modalities PRN    Consulted and Agree with Plan of Care Patient               Joneen Boers PT, DPT     08/01/2022, 5:31 PM

## 2022-08-02 ENCOUNTER — Ambulatory Visit: Payer: HMO | Admitting: Psychology

## 2022-08-07 ENCOUNTER — Telehealth: Payer: Self-pay

## 2022-08-07 ENCOUNTER — Other Ambulatory Visit: Payer: Self-pay | Admitting: Primary Care

## 2022-08-07 ENCOUNTER — Ambulatory Visit: Payer: HMO

## 2022-08-07 DIAGNOSIS — E1165 Type 2 diabetes mellitus with hyperglycemia: Secondary | ICD-10-CM

## 2022-08-07 NOTE — Telephone Encounter (Signed)
No show. Called patient and left a message pertaining to appointment and a reminder for the next follow up session. Return phone call requested. Phone number ([phone]) provided.   

## 2022-08-09 ENCOUNTER — Ambulatory Visit (INDEPENDENT_AMBULATORY_CARE_PROVIDER_SITE_OTHER): Payer: HMO | Admitting: Psychology

## 2022-08-09 ENCOUNTER — Ambulatory Visit: Payer: HMO

## 2022-08-09 DIAGNOSIS — F339 Major depressive disorder, recurrent, unspecified: Secondary | ICD-10-CM | POA: Diagnosis not present

## 2022-08-09 NOTE — Progress Notes (Signed)
[facility] Counselor/Therapist Progress Note  Patient ID: Brandy [name], MRN: [id number]   Date: 08/09/22  Time Spent: 12:02  pm - 12:59 pm : 57  Minutes  Treatment Type: Individual Therapy.  Reported Symptoms: depression and anxiety.  Mental Status Exam: Appearance:  Casual     Behavior: Appropriate  Motor: Normal  Speech/Language:  Clear and Coherent  Affect: Congruent  Mood: normal  Thought process: normal  Thought content:   WNL  Sensory/Perceptual disturbances:   WNL  Orientation: oriented to person, place, time/date, and situation  Attention: Good  Concentration: Good  Memory: WNL  Fund of knowledge:  Good  Insight:   Good  Judgment:  Good  Impulse Control: Good   Risk Assessment: Danger to Self:  No Self-injurious Behavior: No Danger to Others: No Duty to Warn:no Physical Aggression / Violence:No  Access to Firearms a concern: No  Gang Involvement:No   Subjective:   Brandy [name] participated from car, via video and consented to treatment. Therapist participated from home office. We met online due to COVID pandemic. Brandy [name] reviewed the events of the past week. She noted  interpersonal stress, with her husband, which we explored during the session. She noted having done "terrible" in creating a routine and engagement in task completion. We worked on exploring barriers to this during the session. She noted her schedule, overwhelming pending tasks, and a lack of assistance from others in the home. We explored this and worked on identifying ways to communicate needs, become more assertive, and communicate clearly. She noted difficulty modulating her eating during easter and noted eating sugar lead to eating more sugar. She noted a need to "reset" her eating. WE explored this and ways to be more consistent. Brandy [name] was engaged and motivated during the session. Therapist provided supportive therapy.   Interventions: Interpersonal  Diagnosis:  Episode of  recurrent major depressive disorder, unspecified depression episode severity  Psychiatric Treatment: Yes , Brandy [name].    Treatment Plan:  [name], self-aware, and motivated for change.   Support System: Family and friends.    Client Treatment Preferences Outpatient therapy.   Client Statement of Needs Brandy [name] would like to focus on self-care, eating healthfully, engaging in consistent exercise, focusing on sense of self,  being more organized and goal oriented, process past events including childhood, reduce negative self-talk.   Treatment Level Weekly  Symptoms  Depression: loss of interest, feeling down, poor sleep, lethargy, fluctuating appetite, feeling bad about self, and trouble concentrating.    (Status: maintained) Anxiety:  feeling anxious, difficulty managing worry, worrying about different things, trouble relaxing, restlessness, and feeling afraid as if something awful might happen.   (Status: maintained)  Goals:   Brandy [name] experiences symptoms of depression and anxiety.    Target Date: 07/19/23 Frequency: Weekly  Progress: 0 Modality: individual    Therapist will provide referrals for additional resources as appropriate.  Therapist will provide psycho-education regarding Brandy [name]'s diagnosis and corresponding treatment approaches and interventions. Licensed Clinical Social Worker, [place], LCSW will support the patient's ability to achieve the goals identified. will employ CBT, BA, Problem-solving, Solution Focused, Mindfulness,  coping skills, & other evidenced-based practices will be used to promote progress towards healthy functioning to help manage decrease symptoms associated with her diagnosis.   Reduce overall level, frequency, and intensity of the feelings of depression & anxiety and evidenced by decreased overall symptoms  from 6 to 7 days/week to 0 to 1 days/week per client report for  at least 3 consecutive  months. Verbally express understanding of the relationship between feelings of depression, anxiety and their impact on thinking patterns and behaviors. Verbalize an understanding of the role that distorted thinking plays in creating fears, excessive worry, and ruminations.    Brandy [name] participated in the creation of the treatment plan)    [name], LCSW

## 2022-08-13 ENCOUNTER — Ambulatory Visit: Payer: HMO

## 2022-08-13 DIAGNOSIS — M25511 Pain in right shoulder: Secondary | ICD-10-CM | POA: Diagnosis not present

## 2022-08-13 DIAGNOSIS — M542 Cervicalgia: Secondary | ICD-10-CM

## 2022-08-13 DIAGNOSIS — M25611 Stiffness of right shoulder, not elsewhere classified: Secondary | ICD-10-CM

## 2022-08-13 DIAGNOSIS — G8929 Other chronic pain: Secondary | ICD-10-CM

## 2022-08-13 NOTE — Therapy (Signed)
OUTPATIENT PHYSICAL THERAPY TREATMENT NOTE   Patient Name: Brandy [name] MRN: [id number] DOB:03-24-1966, 57 y.o., female Today's Date: 08/13/2022  PCP: Doreene Nest, NP  REFERRING PROVIDER: Hannah Beat, MD  END OF SESSION:  PT End of Session - 08/13/22 1503     Visit Number 4    Number of Visits 17    Date for PT Re-Evaluation 09/20/22    PT Start Time 1503    PT Stop Time 1544    PT Time Calculation (min) 41 min    Activity Tolerance Patient tolerated treatment well    Behavior During Therapy WFL for tasks assessed/performed               Past Medical History:  Diagnosis Date   Anxiety    Arthritis    Back pain    Brain fog    CHF (congestive heart failure) (HCC)    pt not aware of this   Depression    Diabetes mellitus without complication (HCC)    Difficulty walking    Dysmetabolic syndrome X    Fatigue    Fibromyalgia    neuropathy   Hypertension    Hypertriglyceridemia    Joint pain    Leg weakness    Lower extremity edema    Muscle atrophy    Neuropathy    Obesity    Osteoarthritis    PONV (postoperative nausea and vomiting)    Poor memory    Post-menopausal bleeding    Prediabetes    Rosacea    Sleep apnea    not on cpap yet   Tetanus vaccine causing adverse effect in therapeutic use    Vitamin D deficiency    Past Surgical History:  Procedure Laterality Date   BONE TUMOR EXCISION  1975   Right leg   DILATATION & CURETTAGE/HYSTEROSCOPY WITH MYOSURE N/A 10/07/2019   Procedure: DILATATION AND CURETTAGE /HYSTEROSCOPY, Myosure, Polypectomy;  Surgeon: McPherson Bing, MD;  Location: [facility] OR;  Service: Gynecology;  Laterality: N/A;   ENDOMETRIAL BIOPSY  07/14/2019       epidural steroid injection     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   Procedure: LAPAROSCOPIC ROUX-EN-Y GASTRIC BYPASS WITH UPPER ENDOSCOPY;  Surgeon: Sheliah Hatch, De Blanch, MD;  Location: [facility];  Service: General;  Laterality: N/A;   Patient Active Problem List    Diagnosis Date Noted   GERD (gastroesophageal reflux disease) 07/03/2022   Preventative health care 07/03/2022   S/P gastric bypass 06/20/2021   Morbid (severe) obesity due to excess calories 04/04/2021   Near syncope 10/04/2020   Severe obstructive sleep apnea-hypopnea syndrome 09/23/2019   Class 2 obesity due to excess calories with body mass index (BMI) of 38.0 to 38.9 in adult 09/23/2019   Excessive daytime sleepiness 08/07/2019   Acute diastolic congestive heart failure 08/07/2019   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 07/07/2019   Post-menopausal bleeding 06/17/2019   Vaginal burning 06/17/2019   Urinary incontinence 06/17/2019   Lower extremity weakness 05/13/2019   Poor short term memory 05/13/2019   Left groin pain 03/18/2019   Rosacea 08/25/2018   Chronic venous insufficiency 08/07/2018   PAD (peripheral artery disease) 08/07/2018   Chronic bilateral low back pain with left-sided sciatica 07/04/2018   Vitamin B12 deficiency 05/23/2018   Arthralgia of multiple sites 05/01/2018   Abnormal TSH 03/17/2018   GAD (generalized anxiety disorder) 02/24/2018   Vitamin D deficiency 02/24/2018   Bilateral hand pain 04/06/2014   Bilateral wrist pain 04/06/2014  Chronic fatigue 03/15/2014   Palpitations 09/20/2010   Essential hypertension, benign 09/14/2010   HYPERTRIGLYCERIDEMIA 10/04/2009   Lower extremity pain 10/04/2009   Type 2 diabetes mellitus with hyperglycemia 08/12/2009   OBESITY, MORBID 08/12/2009   MDD (major depressive disorder) 08/12/2009    REFERRING DIAG: M75.41 (ICD-10-CM) - Impingement syndrome of right shoulder M75.81 (ICD-10-CM) - Rotator cuff tendonitis, right   THERAPY DIAG:  Chronic right shoulder pain  Cervicalgia  Stiffness of right shoulder, not elsewhere classified  Rationale for Evaluation and Treatment Rehabilitation  PERTINENT HISTORY: M75.41 (ICD-10-CM) - Impingement syndrome of right shoulder M75.81 (ICD-10-CM) - Rotator  cuff tendonitis, right. Pain would be at posterior R shoulder, upper trap area, R shoulder joint, and R arm. Got a cortisone shot on 07/11/2022 which helped. Pain began about 2 years ago, gradual onset. Pt is R hand dominant. Pain has overall worsened since onset, but the cortisone shot provided some relief. Pt also reports R lateral neck pain.   PRECAUTIONS: No known precautions   SUBJECTIVE:   SUBJECTIVE STATEMENT: R shoulder feels good right now, no pain currently. Had superior R shoulder pain last night while sleeping (R arm was in about 145 degrees abduction)     PAIN:  Are you having pain? See subjective   TODAY'S TREATMENT:                                                                                                                                         DATE: 08/13/2022  Blood pressure is controlled.  DM controlled.    No latex allergies.    Pressure to R upper trap feels better per pt.    TTP R distal scalene area    Therapeutic exercises   Standing R shoulder IR isometrics, hand pressing on abdomen 10x5 seconds for 2 sets  Standing B shoulder extension with scapular retraction red band 10x5 seconds for 3 sets  With glute max squeeze and posterior pelvic tilt (to help decrease low back pressure)  Seated manually resisted scapular retraction targeting lower trap   R 10x5 seconds for 3 sets   Seated thoracic extension at chair 10x5 seconds for 2 sets  Pectoralis stretch at doorway  R 30 seconds x 3   Seated B shoulder ER yellow band, pain free range 10x    Improved exercise technique, movement at target joints, use of target muscles after mod verbal, visual, tactile cues.        Response to treatment  Pt tolerated session well without aggravation of symptoms.      Clinical impression   Continued working on scapular strengthening, thoracic extension, ER and IR muscle strengthening to improve glenohumeral mechanics as well as decreasing stress to  shoulder joints. Pt tolerated session well without aggravation of symptoms. Pt will benefit from continued skilled physical therapy services to decrease pain, improve strength, AROM, and function.  PATIENT EDUCATION: Education details: there-ex, HEP Person educated: Patient Education method: Explanation, Demonstration, Tactile cues, Verbal cues, and Handouts Education comprehension: verbalized understanding and returned demonstration  HOME EXERCISE PROGRAM: Access Code: [email or web address] URL: [name] Date: 07/30/2022 Prepared by: Loralyn Freshwater  Exercises - Seated Scapular Retraction  - 2 x daily - 7 x weekly - 3 sets - 10 reps - 5 seconds hold - Supine Cervical Retraction with Towel  - 2 x daily - 7 x weekly - 3 sets - 10 reps - 5 seconds hold - Standing Shoulder External Rotation with Resistance  - 1 x daily - 7 x weekly - 3 sets - 10 reps - Seated Isometric Shoulder Internal Rotation at Chair  - 1 x daily - 7 x weekly - 3 sets - 10 reps - 5 seconds hold    PT Short Term Goals - 07/25/22 1702       PT SHORT TERM GOAL #1   Title Pt will be independent with her initial HEP to decrease pain, improve strength, and ability to reach up and behind her more comfortably.    Time 3    Period Weeks    Status New    Target Date 08/16/22              PT Long Term Goals - 07/25/22 1703       PT LONG TERM GOAL #1   Title Pt will have a decrease in R shoulder pain to 3/10 or less at worst to promote ability to reach up, reach behind her, as well as lay on her R side to sleep more comfortably.    Baseline 6/10 R shoulder pain at worst for the past 3 months (07/25/2022)    Time 8    Period Weeks    Status New    Target Date 09/20/22      PT LONG TERM GOAL #2   Title Pt will improve R shoulder FOTO score by at least 10 points as a demonstration of improved function.    Baseline R shoulder FOTO 54 (07/25/2022)    Time 3    Period Weeks    Status New     Target Date 09/20/22      PT LONG TERM GOAL #3   Title Pt will improve R shoulder IR and ER strength by at least 1/2 MMT grade to promote ability to reach more comfortably.    Baseline R shoulder IR 4/5, ER 4/5 (07/25/2022)    Time 8    Period Weeks    Status New    Target Date 09/20/22      PT LONG TERM GOAL #4   Title Pt will improve R shoulder AROM to at least 150 degrees flexion and abduction to promote ability to reach more comfortably.    Baseline R shoulder AROM 117 degrees flexion, 128 degrees abduction (07/25/2022)    Time 8    Period Weeks    Status New    Target Date 09/20/22      PT LONG TERM GOAL #5   Title Pt will improve R shoulder functional IR to R thumb to T7 spinous process to promote ability to don and doff clothes as well s perform self care more comfortably.    Baseline R shoulder functional IR: R thumb to T10 spinous process (07/25/2022)    Time 8    Period Weeks    Status New    Target Date 09/20/22  Plan - 08/13/22 1503     Clinical Impression Statement Continued working on scapular strengthening, thoracic extension, ER and IR muscle strengthening to improve glenohumeral mechanics as well as decreasing stress to shoulder joints. Pt tolerated session well without aggravation of symptoms. Pt will benefit from continued skilled physical therapy services to decrease pain, improve strength, AROM, and function.    Personal Factors and Comorbidities Comorbidity 3+;Fitness;Past/Current Experience;Time since onset of injury/illness/exacerbation    Comorbidities Anxiety, back pain, arthritis, CHF, depression, HTN, DM, fibromyalgia, neuropathy    Examination-Activity Limitations Bathing;Reach Overhead;Bed Mobility;Dressing;Hygiene/Grooming;Toileting;Lift;Sleep    Stability/Clinical Decision Making Evolving/Moderate complexity   Pt states pain is worsening overall since onset   Rehab Potential Fair    PT Frequency 2x / week    PT Duration 8 weeks    PT  Treatment/Interventions Therapeutic activities;Functional mobility training;Therapeutic exercise;Neuromuscular re-education;Patient/family education;Manual techniques;Dry needling;Joint Manipulations;Spinal Manipulations;Electrical Stimulation;Iontophoresis 4mg /ml Dexamethasone;Traction   traction and/or manipulations if appropriate   PT Next Visit Plan thoracic extension, scapular, anterior cervical, ER, IR strengthening, manual techniques, modalities PRN    Consulted and Agree with Plan of Care Patient               Loralyn Freshwater PT, DPT     08/13/2022, 5:47 PM

## 2022-08-15 ENCOUNTER — Ambulatory Visit: Payer: HMO

## 2022-08-15 ENCOUNTER — Telehealth: Payer: Self-pay

## 2022-08-15 NOTE — Telephone Encounter (Signed)
No show. Called patient and left a message pertaining to appointment and a reminder for the next follow up session. Return phone call requested. Phone number ([phone]) provided.   

## 2022-08-16 ENCOUNTER — Ambulatory Visit (INDEPENDENT_AMBULATORY_CARE_PROVIDER_SITE_OTHER): Payer: HMO | Admitting: Psychology

## 2022-08-16 DIAGNOSIS — F339 Major depressive disorder, recurrent, unspecified: Secondary | ICD-10-CM

## 2022-08-16 NOTE — Progress Notes (Signed)
[facility] Counselor/Therapist Progress Note  Patient ID: Brandy [name], MRN: [id number]   Date: 08/16/22  Time Spent: 12:07 pm - 12:54 pm : 47 Minutes  Treatment Type: Individual Therapy.  Reported Symptoms: depression and anxiety.  Mental Status Exam: Appearance:  Casual     Behavior: Appropriate  Motor: Normal  Speech/Language:  Clear and Coherent  Affect: Congruent  Mood: normal  Thought process: normal  Thought content:   WNL  Sensory/Perceptual disturbances:   WNL  Orientation: oriented to person, place, time/date, and situation  Attention: Good  Concentration: Good  Memory: WNL  Fund of knowledge:  Good  Insight:   Good  Judgment:  Good  Impulse Control: Good   Risk Assessment: Danger to Self:  No Self-injurious Behavior: No Danger to Others: No Duty to Warn:no Physical Aggression / Violence:No  Access to Firearms a concern: No  Gang Involvement:No   Subjective:   Brandy [name] participated from home, via video and consented to treatment. Therapist participated from home office. We met online due to COVID pandemic. Brandy [name] reviewed the events of the past week. She noted difficulty "being on the ball" this past week and noted this being due to a lack of urgency and "not a lack of want". She noted a lack of consistency in regards to self-care and creating a meaningful routine. Brandy [name] noted shower, dental care, taking medication, eat breakfast, and exercising being difficult. Additional barriers including "all or nothing". Additionally, we identified "over-thinking" being a barrier. She noted confusion and frustration regarding engagement in self-care tasks. Therapist challenged Brandy [name] in regards to her rigid self-talk regarding when the shower is, how long it needs to take, and her all or nothing thinking. Therapist encouraged Brandy [name] to use reminders, simplify her goals, and plan ahead of time.  Brandy [name] was engaged and motivated during the session. Therapist  provided supportive therapy.   Interventions: BA and CBT.   Diagnosis:  Episode of recurrent major depressive disorder, unspecified depression episode severity  Psychiatric Treatment: Yes , Brandy [name].    Treatment Plan:  [name], self-aware, and motivated for change.   Support System: Family and friends.    Client Treatment Preferences Outpatient therapy.   Client Statement of Needs Brandy [name] would like to focus on self-care, eating healthfully, engaging in consistent exercise, focusing on sense of self,  being more organized and goal oriented, process past events including childhood, reduce negative self-talk.   Treatment Level Weekly  Symptoms  Depression: loss of interest, feeling down, poor sleep, lethargy, fluctuating appetite, feeling bad about self, and trouble concentrating.    (Status: maintained) Anxiety:  feeling anxious, difficulty managing worry, worrying about different things, trouble relaxing, restlessness, and feeling afraid as if something awful might happen.   (Status: maintained)  Goals:   Brandy [name] experiences symptoms of depression and anxiety.    Target Date: 07/19/23 Frequency: Weekly  Progress: 0 Modality: individual    Therapist will provide referrals for additional resources as appropriate.  Therapist will provide psycho-education regarding Brandy [name]'s diagnosis and corresponding treatment approaches and interventions. Licensed Clinical Social Worker, [place], LCSW will support the patient's ability to achieve the goals identified. will employ CBT, BA, Problem-solving, Solution Focused, Mindfulness,  coping skills, & other evidenced-based practices will be used to promote progress towards healthy functioning to help manage decrease symptoms associated with her diagnosis.   Reduce overall level, frequency, and intensity of the feelings of depression & anxiety and evidenced by decreased overall symptoms  from  6 to 7  days/week to 0 to 1 days/week per client report for at least 3 consecutive months. Verbally express understanding of the relationship between feelings of depression, anxiety and their impact on thinking patterns and behaviors. Verbalize an understanding of the role that distorted thinking plays in creating fears, excessive worry, and ruminations.    Brandy [name] participated in the creation of the treatment plan)    Brandy Ovens, LCSW

## 2022-08-20 ENCOUNTER — Ambulatory Visit: Payer: HMO

## 2022-08-20 DIAGNOSIS — G8929 Other chronic pain: Secondary | ICD-10-CM

## 2022-08-20 DIAGNOSIS — M542 Cervicalgia: Secondary | ICD-10-CM

## 2022-08-20 DIAGNOSIS — M25611 Stiffness of right shoulder, not elsewhere classified: Secondary | ICD-10-CM

## 2022-08-20 DIAGNOSIS — M25511 Pain in right shoulder: Secondary | ICD-10-CM | POA: Diagnosis not present

## 2022-08-20 NOTE — Therapy (Signed)
OUTPATIENT PHYSICAL THERAPY TREATMENT NOTE   Patient Name: Brandy [name] MRN: [id number] DOB:Feb 11, 1966, 57 y.o., female Today's Date: 08/20/2022  PCP: Doreene Nest, NP  REFERRING PROVIDER: Hannah Beat, MD  END OF SESSION:  PT End of Session - 08/20/22 1719     Visit Number 5    Number of Visits 17    Date for PT Re-Evaluation 09/20/22    PT Start Time 1719    PT Stop Time 1807    PT Time Calculation (min) 48 min    Activity Tolerance Patient tolerated treatment well    Behavior During Therapy WFL for tasks assessed/performed                Past Medical History:  Diagnosis Date   Anxiety    Arthritis    Back pain    Brain fog    CHF (congestive heart failure) (HCC)    pt not aware of this   Depression    Diabetes mellitus without complication (HCC)    Difficulty walking    Dysmetabolic syndrome X    Fatigue    Fibromyalgia    neuropathy   Hypertension    Hypertriglyceridemia    Joint pain    Leg weakness    Lower extremity edema    Muscle atrophy    Neuropathy    Obesity    Osteoarthritis    PONV (postoperative nausea and vomiting)    Poor memory    Post-menopausal bleeding    Prediabetes    Rosacea    Sleep apnea    not on cpap yet   Tetanus vaccine causing adverse effect in therapeutic use    Vitamin D deficiency    Past Surgical History:  Procedure Laterality Date   BONE TUMOR EXCISION  1975   Right leg   DILATATION & CURETTAGE/HYSTEROSCOPY WITH MYOSURE N/A 10/07/2019   Procedure: DILATATION AND CURETTAGE /HYSTEROSCOPY, Myosure, Polypectomy;  Surgeon: Hayden Bing, MD;  Location: [facility];  Service: Gynecology;  Laterality: N/A;   ENDOMETRIAL BIOPSY  07/14/2019       epidural steroid injection     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   Procedure: LAPAROSCOPIC ROUX-EN-Y GASTRIC BYPASS WITH UPPER ENDOSCOPY;  Surgeon: Sheliah Hatch, De Blanch, MD;  Location: [facility];  Service: General;  Laterality: N/A;   Patient Active Problem List    Diagnosis Date Noted   GERD (gastroesophageal reflux disease) 07/03/2022   Preventative health care 07/03/2022   S/P gastric bypass 06/20/2021   Morbid (severe) obesity due to excess calories 04/04/2021   Near syncope 10/04/2020   Severe obstructive sleep apnea-hypopnea syndrome 09/23/2019   Class 2 obesity due to excess calories with body mass index (BMI) of 38.0 to 38.9 in adult 09/23/2019   Excessive daytime sleepiness 08/07/2019   Acute diastolic congestive heart failure 08/07/2019   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 07/07/2019   Post-menopausal bleeding 06/17/2019   Vaginal burning 06/17/2019   Urinary incontinence 06/17/2019   Lower extremity weakness 05/13/2019   Poor short term memory 05/13/2019   Left groin pain 03/18/2019   Rosacea 08/25/2018   Chronic venous insufficiency 08/07/2018   PAD (peripheral artery disease) 08/07/2018   Chronic bilateral low back pain with left-sided sciatica 07/04/2018   Vitamin B12 deficiency 05/23/2018   Arthralgia of multiple sites 05/01/2018   Abnormal TSH 03/17/2018   GAD (generalized anxiety disorder) 02/24/2018   Vitamin D deficiency 02/24/2018   Bilateral hand pain 04/06/2014   Bilateral wrist pain 04/06/2014  Chronic fatigue 03/15/2014   Palpitations 09/20/2010   Essential hypertension, benign 09/14/2010   HYPERTRIGLYCERIDEMIA 10/04/2009   Lower extremity pain 10/04/2009   Type 2 diabetes mellitus with hyperglycemia 08/12/2009   OBESITY, MORBID 08/12/2009   MDD (major depressive disorder) 08/12/2009    REFERRING DIAG: M75.41 (ICD-10-CM) - Impingement syndrome of right shoulder M75.81 (ICD-10-CM) - Rotator cuff tendonitis, right   THERAPY DIAG:  Chronic right shoulder pain  Cervicalgia  Stiffness of right shoulder, not elsewhere classified  Rationale for Evaluation and Treatment Rehabilitation  PERTINENT HISTORY: M75.41 (ICD-10-CM) - Impingement syndrome of right shoulder M75.81 (ICD-10-CM) - Rotator  cuff tendonitis, right. Pain would be at posterior R shoulder, upper trap area, R shoulder joint, and R arm. Got a cortisone shot on 07/11/2022 which helped. Pain began about 2 years ago, gradual onset. Pt is R hand dominant. Pain has overall worsened since onset, but the cortisone shot provided some relief. Pt also reports R lateral neck pain.   PRECAUTIONS: No known precautions   SUBJECTIVE:   SUBJECTIVE STATEMENT: R shoulder is fine now but got really sore last week Tuesday night. Has eased off a little. 2/10 R upper trap, anterior shoulder and arm currently. Feels symptoms in R arm (radial nerve distribution).    PAIN:  Are you having pain? See subjective   TODAY'S TREATMENT:                                                                                                                                         DATE: 08/20/2022  Blood pressure is controlled.  DM controlled.    No latex allergies.    Pressure to R upper trap feels better per pt.    TTP R distal scalene area    Therapeutic exercises  Seated manually resisted scapular retraction targeting lower trap muscles   R 10x5 seconds for 2 sets  Seated chin tucks 10x5 seconds for 2 sets  Seated R shoulder flexion with scapular retraction 10x   Improved exercise technique, movement at target joints, use of target muscles after mod verbal, visual, tactile cues.   Manual therapy  Pressure to R upper trap reproduced R upper trap and anterior R shoulder symptoms.   Seated STM R upper trap and R cervical paraspinal muscles to decrease tension   Seated STM R infraspinatus area  Feels like something is getting worked out per pt.         Response to treatment  Pt tolerated session well without aggravation of symptoms.      Clinical impression  Worked on decreasing R upper trap, and cervical paraspinal muscles to decrease tension to her neck and shoulder. Worked on manual therapy to decrease muscle tension  posterior R shoulder area to radial nerve. Pt tolerated session well without aggravation of symptoms. Pt will benefit from continued skilled physical therapy services to decrease pain, improve strength, AROM, and function.  PATIENT EDUCATION: Education details: there-ex, HEP Person educated: Patient Education method: Explanation, Demonstration, Tactile cues, Verbal cues, and Handouts Education comprehension: verbalized understanding and returned demonstration  HOME EXERCISE PROGRAM: Access Code: [email or web address] URL: [email or web address] Date: 07/30/2022 Prepared by: Loralyn Freshwater  Exercises - Seated Scapular Retraction  - 2 x daily - 7 x weekly - 3 sets - 10 reps - 5 seconds hold - Supine Cervical Retraction with Towel  - 2 x daily - 7 x weekly - 3 sets - 10 reps - 5 seconds hold - Standing Shoulder External Rotation with Resistance  - 1 x daily - 7 x weekly - 3 sets - 10 reps - Seated Isometric Shoulder Internal Rotation at Chair  - 1 x daily - 7 x weekly - 3 sets - 10 reps - 5 seconds hold    PT Short Term Goals - 07/25/22 1702       PT SHORT TERM GOAL #1   Title Pt will be independent with her initial HEP to decrease pain, improve strength, and ability to reach up and behind her more comfortably.    Time 3    Period Weeks    Status New    Target Date 08/16/22              PT Long Term Goals - 07/25/22 1703       PT LONG TERM GOAL #1   Title Pt will have a decrease in R shoulder pain to 3/10 or less at worst to promote ability to reach up, reach behind her, as well as lay on her R side to sleep more comfortably.    Baseline 6/10 R shoulder pain at worst for the past 3 months (07/25/2022)    Time 8    Period Weeks    Status New    Target Date 09/20/22      PT LONG TERM GOAL #2   Title Pt will improve R shoulder FOTO score by at least 10 points as a demonstration of improved function.    Baseline R shoulder FOTO 54 (07/25/2022)    Time 3    Period  Weeks    Status New    Target Date 09/20/22      PT LONG TERM GOAL #3   Title Pt will improve R shoulder IR and ER strength by at least 1/2 MMT grade to promote ability to reach more comfortably.    Baseline R shoulder IR 4/5, ER 4/5 (07/25/2022)    Time 8    Period Weeks    Status New    Target Date 09/20/22      PT LONG TERM GOAL #4   Title Pt will improve R shoulder AROM to at least 150 degrees flexion and abduction to promote ability to reach more comfortably.    Baseline R shoulder AROM 117 degrees flexion, 128 degrees abduction (07/25/2022)    Time 8    Period Weeks    Status New    Target Date 09/20/22      PT LONG TERM GOAL #5   Title Pt will improve R shoulder functional IR to R thumb to T7 spinous process to promote ability to don and doff clothes as well s perform self care more comfortably.    Baseline R shoulder functional IR: R thumb to T10 spinous process (07/25/2022)    Time 8    Period Weeks    Status New    Target Date 09/20/22  Plan - 08/20/22 1718     Clinical Impression Statement Worked on decreasing R upper trap, and cervical paraspinal muscles to decrease tension to her neck and shoulder. Worked on manual therapy to decrease muscle tension posterior R shoulder area to radial nerve. Pt tolerated session well without aggravation of symptoms. Pt will benefit from continued skilled physical therapy services to decrease pain, improve strength, AROM, and function.    Personal Factors and Comorbidities Comorbidity 3+;Fitness;Past/Current Experience;Time since onset of injury/illness/exacerbation    Comorbidities Anxiety, back pain, arthritis, CHF, depression, HTN, DM, fibromyalgia, neuropathy    Examination-Activity Limitations Bathing;Reach Overhead;Bed Mobility;Dressing;Hygiene/Grooming;Toileting;Lift;Sleep    Stability/Clinical Decision Making Evolving/Moderate complexity   Pt states pain is worsening overall since onset   Rehab Potential Fair     PT Frequency 2x / week    PT Duration 8 weeks    PT Treatment/Interventions Therapeutic activities;Functional mobility training;Therapeutic exercise;Neuromuscular re-education;Patient/family education;Manual techniques;Dry needling;Joint Manipulations;Spinal Manipulations;Electrical Stimulation;Iontophoresis /ml Dexamethasone;Traction   traction and/or manipulations if appropriate   PT Next Visit Plan thoracic extension, scapular, anterior cervical, ER, IR strengthening, manual techniques, modalities PRN    PT Home Exercise Plan medbridge Access Code: [email or web address]    Consulted and Agree with Plan of Care Patient               Loralyn Freshwater PT, DPT     08/20/2022, 6:14 PM

## 2022-08-22 ENCOUNTER — Ambulatory Visit: Payer: HMO

## 2022-08-23 ENCOUNTER — Ambulatory Visit (INDEPENDENT_AMBULATORY_CARE_PROVIDER_SITE_OTHER): Payer: HMO | Admitting: Psychology

## 2022-08-23 ENCOUNTER — Ambulatory Visit: Payer: HMO

## 2022-08-23 DIAGNOSIS — M25511 Pain in right shoulder: Secondary | ICD-10-CM | POA: Diagnosis not present

## 2022-08-23 DIAGNOSIS — F339 Major depressive disorder, recurrent, unspecified: Secondary | ICD-10-CM

## 2022-08-23 DIAGNOSIS — M25611 Stiffness of right shoulder, not elsewhere classified: Secondary | ICD-10-CM

## 2022-08-23 DIAGNOSIS — M542 Cervicalgia: Secondary | ICD-10-CM

## 2022-08-23 DIAGNOSIS — G8929 Other chronic pain: Secondary | ICD-10-CM

## 2022-08-23 NOTE — Progress Notes (Signed)
[facility] Counselor/Therapist Progress Note  Patient ID: Brandy [name], MRN: [id number]   Date: 08/23/22  Time Spent: 12:07 pm - 12:59 pm : 52 Minutes  Treatment Type: Individual Therapy.  Reported Symptoms: depression and anxiety.  Mental Status Exam: Appearance:  Casual     Behavior: Appropriate  Motor: Normal  Speech/Language:  Clear and Coherent  Affect: Congruent  Mood: normal  Thought process: normal  Thought content:   WNL  Sensory/Perceptual disturbances:   WNL  Orientation: oriented to person, place, time/date, and situation  Attention: Good  Concentration: Good  Memory: WNL  Fund of knowledge:  Good  Insight:   Good  Judgment:  Good  Impulse Control: Good   Risk Assessment: Danger to Self:  No Self-injurious Behavior: No Danger to Others: No Duty to Warn:no Physical Aggression / Violence:No  Access to Firearms a concern: No  Gang Involvement:No   Subjective:   Brandy [name] participated from home, via video and consented to treatment. Therapist participated from home office. We met online due to COVID pandemic. Brandy [name] reviewed the events of the past week. She noted continues to struggle with creating a routine and completing tasks regularly including tasks of self-care and ADLs. She noted difficulty defining the cause of her complacency. She noted a need to define this and therapist highlighted the possibility of this being a barrier to progress. We worked on dissecting this need and worked on identifying points of acceptance in this area. She identified over-thinking as being a barrier. We worked on identifying ways to reduce over-thinking and answer-seeking and worked on identifying boundaries for self. We worked on continuing to process her need for attention from others and noted positive feelings in relation. She would like to explore this going forward. Brandy [name] was engaged and motivated during the session. Therapist provided supportive therapy.    Interventions: BA and CBT.   Diagnosis:  Episode of recurrent major depressive disorder, unspecified depression episode severity  Psychiatric Treatment: Yes , Brandy [name].    Treatment Plan:  Client Abilities/Strengths Danicia is intelligent, self-aware, and motivated for change.   Support System: Family and friends.    Client Treatment Preferences Outpatient therapy.   Client Statement of Needs Janara would like to focus on self-care, eating healthfully, engaging in consistent exercise, focusing on sense of self,  being more organized and goal oriented, process past events including childhood, reduce negative self-talk.   Treatment Level Weekly  Symptoms  Depression: loss of interest, feeling down, poor sleep, lethargy, fluctuating appetite, feeling bad about self, and trouble concentrating.    (Status: maintained) Anxiety:  feeling anxious, difficulty managing worry, worrying about different things, trouble relaxing, restlessness, and feeling afraid as if something awful might happen.   (Status: maintained)  Goals:   Liliane experiences symptoms of depression and anxiety.    Target Date: 07/19/23 Frequency: Weekly  Progress: 0 Modality: individual    Therapist will provide referrals for additional resources as appropriate.  Therapist will provide psycho-education regarding Rovena's diagnosis and corresponding treatment approaches and interventions. Brandy [name], [place], LCSW will support the patient's ability to achieve the goals identified. will employ CBT, BA, Problem-solving, Solution Focused, Mindfulness,  coping skills, & other evidenced-based practices will be used to promote progress towards healthy functioning to help manage decrease symptoms associated with her diagnosis.   Reduce overall level, frequency, and intensity of the feelings of depression & anxiety and evidenced by decreased overall symptoms  from 6 to 7 days/week to  0 to 1 days/week  per client report for at least 3 consecutive months. Verbally express understanding of the relationship between feelings of depression, anxiety and their impact on thinking patterns and behaviors. Verbalize an understanding of the role that distorted thinking plays in creating fears, excessive worry, and ruminations.    [name] participated in the creation of the treatment plan)    [name], LCSW

## 2022-08-23 NOTE — Therapy (Signed)
OUTPATIENT PHYSICAL THERAPY TREATMENT NOTE   Patient Name: Brandy [name] MRN: [id number] DOB:1966/04/19, 57 y.o., female Today's Date: 08/23/2022  PCP: Doreene Nest, NP  REFERRING PROVIDER: Hannah Beat, MD  END OF SESSION:  PT End of Session - 08/23/22 1111     Visit Number 6    Number of Visits 17    Date for PT Re-Evaluation 09/20/22    PT Start Time 1111   Pt arrived late   PT Stop Time 1145    PT Time Calculation (min) 34 min    Activity Tolerance Patient tolerated treatment well    Behavior During Therapy WFL for tasks assessed/performed                 Past Medical History:  Diagnosis Date   Anxiety    Arthritis    Back pain    Brain fog    CHF (congestive heart failure) (HCC)    pt not aware of this   Depression    Diabetes mellitus without complication (HCC)    Difficulty walking    Dysmetabolic syndrome X    Fatigue    Fibromyalgia    neuropathy   Hypertension    Hypertriglyceridemia    Joint pain    Leg weakness    Lower extremity edema    Muscle atrophy    Neuropathy    Obesity    Osteoarthritis    PONV (postoperative nausea and vomiting)    Poor memory    Post-menopausal bleeding    Prediabetes    Rosacea    Sleep apnea    not on cpap yet   Tetanus vaccine causing adverse effect in therapeutic use    Vitamin D deficiency    Past Surgical History:  Procedure Laterality Date   BONE TUMOR EXCISION  1975   Right leg   DILATATION & CURETTAGE/HYSTEROSCOPY WITH MYOSURE N/A 10/07/2019   Procedure: DILATATION AND CURETTAGE /HYSTEROSCOPY, Myosure, Polypectomy;  Surgeon: [place] Bing, MD;  Location: [facility];  Service: Gynecology;  Laterality: N/A;   ENDOMETRIAL BIOPSY  07/14/2019       epidural steroid injection     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   Procedure: LAPAROSCOPIC ROUX-EN-Y GASTRIC BYPASS WITH UPPER ENDOSCOPY;  Surgeon: Sheliah Hatch, De Blanch, MD;  Location: [facility];  Service: General;  Laterality: N/A;   Patient Active  Problem List   Diagnosis Date Noted   GERD (gastroesophageal reflux disease) 07/03/2022   Preventative health care 07/03/2022   S/P gastric bypass 06/20/2021   Morbid (severe) obesity due to excess calories 04/04/2021   Near syncope 10/04/2020   Severe obstructive sleep apnea-hypopnea syndrome 09/23/2019   Class 2 obesity due to excess calories with body mass index (BMI) of 38.0 to 38.9 in adult 09/23/2019   Excessive daytime sleepiness 08/07/2019   Acute diastolic congestive heart failure 08/07/2019   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 07/07/2019   Post-menopausal bleeding 06/17/2019   Vaginal burning 06/17/2019   Urinary incontinence 06/17/2019   Lower extremity weakness 05/13/2019   Poor short term memory 05/13/2019   Left groin pain 03/18/2019   Rosacea 08/25/2018   Chronic venous insufficiency 08/07/2018   PAD (peripheral artery disease) 08/07/2018   Chronic bilateral low back pain with left-sided sciatica 07/04/2018   Vitamin B12 deficiency 05/23/2018   Arthralgia of multiple sites 05/01/2018   Abnormal TSH 03/17/2018   GAD (generalized anxiety disorder) 02/24/2018   Vitamin D deficiency 02/24/2018   Bilateral hand pain 04/06/2014  Bilateral wrist pain 04/06/2014   Chronic fatigue 03/15/2014   Palpitations 09/20/2010   Essential hypertension, benign 09/14/2010   HYPERTRIGLYCERIDEMIA 10/04/2009   Lower extremity pain 10/04/2009   Type 2 diabetes mellitus with hyperglycemia 08/12/2009   OBESITY, MORBID 08/12/2009   MDD (major depressive disorder) 08/12/2009    REFERRING DIAG: M75.41 (ICD-10-CM) - Impingement syndrome of right shoulder M75.81 (ICD-10-CM) - Rotator cuff tendonitis, right   THERAPY DIAG:  Chronic right shoulder pain  Cervicalgia  Stiffness of right shoulder, not elsewhere classified  Rationale for Evaluation and Treatment Rehabilitation  PERTINENT HISTORY: M75.41 (ICD-10-CM) - Impingement syndrome of right shoulder M75.81  (ICD-10-CM) - Rotator cuff tendonitis, right. Pain would be at posterior R shoulder, upper trap area, R shoulder joint, and R arm. Got a cortisone shot on 07/11/2022 which helped. Pain began about 2 years ago, gradual onset. Pt is R hand dominant. Pain has overall worsened since onset, but the cortisone shot provided some relief. Pt also reports R lateral neck pain.   PRECAUTIONS: No known precautions   SUBJECTIVE:   SUBJECTIVE STATEMENT: R shoulder feels pretty good, not bad, had soreness after last session. Not sore unless she touches it currently. Bothered her last night to a 3/10 currently      PAIN:  Are you having pain? See subjective   TODAY'S TREATMENT:                                                                                                                                         DATE: 08/23/2022  Blood pressure is controlled.  DM controlled.    No latex allergies.    Pressure to R upper trap feels better per pt.    TTP R distal scalene area    Therapeutic exercises  Cervical AROM  Flexion, no pain  Extnsion, no pain,   Side bend   R no pain   L stretch at affected area (R levator scapula muscle)   Rotation   R: slight tinge of pain at affected area (levator scapula muscle)    L: No pain.    Seated R levator scapula stretch 15 seconds x 5  Supine cervical nod 10x5 seconds for 3 sets   Supine chin tucks 10x3 with 5 second holds   Supine shoulder flexion with scapular retraction 10x to 90 degrees      Improved exercise technique, movement at target joints, use of target muscles after mod verbal, visual, tactile cues.    Response to treatment  Pt tolerated session well without aggravation of symptoms. Decreased pain reported after session.      Clinical impression  Pt arrived late so session was adjusted accordingly. Worked on decreasing R levator scapulae muscle tension, improving anterior cervical muscle strength and R scapular mechanics to  decrease extension stress to cervical spine. Decreased pain reported after session. Pt will benefit from continued skilled physical therapy services to  decrease pain, improve strength, AROM, and function.        PATIENT EDUCATION: Education details: there-ex, HEP Person educated: Patient Education method: Explanation, Demonstration, Tactile cues, Verbal cues, and Handouts Education comprehension: verbalized understanding and returned demonstration  HOME EXERCISE PROGRAM: Access Code: [email or web address] URL: [email or web address] Date: 07/30/2022 Prepared by: Loralyn Freshwater  Exercises - Seated Scapular Retraction  - 2 x daily - 7 x weekly - 3 sets - 10 reps - 5 seconds hold - Supine Cervical Retraction with Towel  - 2 x daily - 7 x weekly - 3 sets - 10 reps - 5 seconds hold - Standing Shoulder External Rotation with Resistance  - 1 x daily - 7 x weekly - 3 sets - 10 reps - Seated Isometric Shoulder Internal Rotation at Chair  - 1 x daily - 7 x weekly - 3 sets - 10 reps - 5 seconds hold    PT Short Term Goals - 07/25/22 1702       PT SHORT TERM GOAL #1   Title Pt will be independent with her initial HEP to decrease pain, improve strength, and ability to reach up and behind her more comfortably.    Time 3    Period Weeks    Status New    Target Date 08/16/22              PT Long Term Goals - 07/25/22 1703       PT LONG TERM GOAL #1   Title Pt will have a decrease in R shoulder pain to 3/10 or less at worst to promote ability to reach up, reach behind her, as well as lay on her R side to sleep more comfortably.    Baseline 6/10 R shoulder pain at worst for the past 3 months (07/25/2022)    Time 8    Period Weeks    Status New    Target Date 09/20/22      PT LONG TERM GOAL #2   Title Pt will improve R shoulder FOTO score by at least 10 points as a demonstration of improved function.    Baseline R shoulder FOTO 54 (07/25/2022)    Time 3    Period Weeks    Status  New    Target Date 09/20/22      PT LONG TERM GOAL #3   Title Pt will improve R shoulder IR and ER strength by at least 1/2 MMT grade to promote ability to reach more comfortably.    Baseline R shoulder IR 4/5, ER 4/5 (07/25/2022)    Time 8    Period Weeks    Status New    Target Date 09/20/22      PT LONG TERM GOAL #4   Title Pt will improve R shoulder AROM to at least 150 degrees flexion and abduction to promote ability to reach more comfortably.    Baseline R shoulder AROM 117 degrees flexion, 128 degrees abduction (07/25/2022)    Time 8    Period Weeks    Status New    Target Date 09/20/22      PT LONG TERM GOAL #5   Title Pt will improve R shoulder functional IR to R thumb to T7 spinous process to promote ability to don and doff clothes as well s perform self care more comfortably.    Baseline R shoulder functional IR: R thumb to T10 spinous process (07/25/2022)    Time 8    Period Weeks    Status New  Target Date 09/20/22              Plan - 08/23/22 1111     Clinical Impression Statement Pt arrived late so session was adjusted accordingly. Worked on decreasing R levator scapulae muscle tension, improving anterior cervical muscle strength and R scapular mechanics to decrease extension stress to cervical spine. Decreased pain reported after session. Pt will benefit from continued skilled physical therapy services to decrease pain, improve strength, AROM, and function.    Personal Factors and Comorbidities Comorbidity 3+;Fitness;Past/Current Experience;Time since onset of injury/illness/exacerbation    Comorbidities Anxiety, back pain, arthritis, CHF, depression, HTN, DM, fibromyalgia, neuropathy    Examination-Activity Limitations Bathing;Reach Overhead;Bed Mobility;Dressing;Hygiene/Grooming;Toileting;Lift;Sleep    Stability/Clinical Decision Making Evolving/Moderate complexity   Pt states pain is worsening overall since onset   Rehab Potential Fair    PT Frequency 2x /  week    PT Duration 8 weeks    PT Treatment/Interventions Therapeutic activities;Functional mobility training;Therapeutic exercise;Neuromuscular re-education;Patient/family education;Manual techniques;Dry needling;Joint Manipulations;Spinal Manipulations;Electrical Stimulation;Iontophoresis 4mg /ml Dexamethasone;Traction   traction and/or manipulations if appropriate   PT Next Visit Plan thoracic extension, scapular, anterior cervical, ER, IR strengthening, manual techniques, modalities PRN    PT Home Exercise Plan medbridge Access Code: [email or web address]    Consulted and Agree with Plan of Care Patient                Loralyn Freshwater PT, DPT     08/23/2022, 12:47 PM

## 2022-08-29 ENCOUNTER — Ambulatory Visit: Payer: HMO | Attending: Family Medicine

## 2022-08-29 DIAGNOSIS — M542 Cervicalgia: Secondary | ICD-10-CM

## 2022-08-29 DIAGNOSIS — M25511 Pain in right shoulder: Secondary | ICD-10-CM | POA: Insufficient documentation

## 2022-08-29 DIAGNOSIS — M25611 Stiffness of right shoulder, not elsewhere classified: Secondary | ICD-10-CM

## 2022-08-29 DIAGNOSIS — G8929 Other chronic pain: Secondary | ICD-10-CM | POA: Diagnosis not present

## 2022-08-29 NOTE — Therapy (Signed)
OUTPATIENT PHYSICAL THERAPY TREATMENT NOTE   Patient Name: Brandy [name] MRN: [id number] DOB:1966-01-22, 57 y.o., female Today's Date: 08/29/2022  PCP: Doreene Nest, NP  REFERRING PROVIDER: Hannah Beat, MD  END OF SESSION:  PT End of Session - 08/29/22 1509     Visit Number 7    Number of Visits 17    Date for PT Re-Evaluation 09/20/22    PT Start Time 1509    PT Stop Time 1558    PT Time Calculation (min) 49 min    Activity Tolerance Patient tolerated treatment well    Behavior During Therapy WFL for tasks assessed/performed                  Past Medical History:  Diagnosis Date   Anxiety    Arthritis    Back pain    Brain fog    CHF (congestive heart failure) (HCC)    pt not aware of this   Depression    Diabetes mellitus without complication (HCC)    Difficulty walking    Dysmetabolic syndrome X    Fatigue    Fibromyalgia    neuropathy   Hypertension    Hypertriglyceridemia    Joint pain    Leg weakness    Lower extremity edema    Muscle atrophy    Neuropathy    Obesity    Osteoarthritis    PONV (postoperative nausea and vomiting)    Poor memory    Post-menopausal bleeding    Prediabetes    Rosacea    Sleep apnea    not on cpap yet   Tetanus vaccine causing adverse effect in therapeutic use    Vitamin D deficiency    Past Surgical History:  Procedure Laterality Date   BONE TUMOR EXCISION  1975   Right leg   DILATATION & CURETTAGE/HYSTEROSCOPY WITH MYOSURE N/A 10/07/2019   Procedure: DILATATION AND CURETTAGE /HYSTEROSCOPY, Myosure, Polypectomy;  Surgeon: [place] Bing, MD;  Location: [facility];  Service: Gynecology;  Laterality: N/A;   ENDOMETRIAL BIOPSY  07/14/2019       epidural steroid injection     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   Procedure: LAPAROSCOPIC ROUX-EN-Y GASTRIC BYPASS WITH UPPER ENDOSCOPY;  Surgeon: Sheliah Hatch, [place], MD;  Location: [facility];  Service: General;  Laterality: N/A;   Patient Active Problem List    Diagnosis Date Noted   GERD (gastroesophageal reflux disease) 07/03/2022   Preventative health care 07/03/2022   S/P gastric bypass 06/20/2021   Morbid (severe) obesity due to excess calories (HCC) 04/04/2021   Near syncope 10/04/2020   Severe obstructive sleep apnea-hypopnea syndrome 09/23/2019   Class 2 obesity due to excess calories with body mass index (BMI) of 38.0 to 38.9 in adult 09/23/2019   Excessive daytime sleepiness 08/07/2019   Acute diastolic congestive heart failure (HCC) 08/07/2019   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 07/07/2019   Post-menopausal bleeding 06/17/2019   Vaginal burning 06/17/2019   Urinary incontinence 06/17/2019   Lower extremity weakness 05/13/2019   Poor short term memory 05/13/2019   Left groin pain 03/18/2019   Rosacea 08/25/2018   Chronic venous insufficiency 08/07/2018   PAD (peripheral artery disease) (HCC) 08/07/2018   Chronic bilateral low back pain with left-sided sciatica 07/04/2018   Vitamin B12 deficiency 05/23/2018   Arthralgia of multiple sites 05/01/2018   Abnormal TSH 03/17/2018   GAD (generalized anxiety disorder) 02/24/2018   Vitamin D deficiency 02/24/2018   Bilateral hand pain 04/06/2014  Bilateral wrist pain 04/06/2014   Chronic fatigue 03/15/2014   Palpitations 09/20/2010   Essential hypertension, benign 09/14/2010   HYPERTRIGLYCERIDEMIA 10/04/2009   Lower extremity pain 10/04/2009   Type 2 diabetes mellitus with hyperglycemia (HCC) 08/12/2009   OBESITY, MORBID 08/12/2009   MDD (major depressive disorder) 08/12/2009    REFERRING DIAG: M75.41 (ICD-10-CM) - Impingement syndrome of right shoulder M75.81 (ICD-10-CM) - Rotator cuff tendonitis, right   THERAPY DIAG:  Chronic right shoulder pain  Cervicalgia  Stiffness of right shoulder, not elsewhere classified  Rationale for Evaluation and Treatment Rehabilitation  PERTINENT HISTORY: M75.41 (ICD-10-CM) - Impingement syndrome of right shoulder  M75.81 (ICD-10-CM) - Rotator cuff tendonitis, right. Pain would be at posterior R shoulder, upper trap area, R shoulder joint, and R arm. Got a cortisone shot on 07/11/2022 which helped. Pain began about 2 years ago, gradual onset. Pt is R hand dominant. Pain has overall worsened since onset, but the cortisone shot provided some relief. Pt also reports R lateral neck pain.   PRECAUTIONS: No known precautions   SUBJECTIVE:   SUBJECTIVE STATEMENT: R shoulder is about a 2/10 currently, a little sore. Tried some machines at [employer].      PAIN:  Are you having pain? See subjective   TODAY'S TREATMENT:                                                                                                                                         DATE: 08/29/2022  Blood pressure is controlled.  DM controlled.    No latex allergies.    Pressure to R upper trap feels better per pt.    TTP R distal scalene area    Therapeutic exercises  R first rib stretch with strap 30 seconds x 3  OMEGA machine  Rows plate 15 for 16X0  Seated manually resisted scapular retraction targeting the lower trap muscle  R 10x5 seconds for 3 sets  Seated R levator scapula stretch 30 seconds x 3  Supine cervical nod 10x5 seconds for 2 sets   Supine chin tucks 10x3 with 5 second holds    Improved exercise technique, movement at target joints, use of target muscles after mod verbal, visual, tactile cues.    Response to treatment Fair tolerance to today's session    Clinical impression Worked on improving lower trap strength to decrease R upper trap muscle tension and decreasing R scalene and levator scapula muscle tension to neck to decrease R superior shoulder pain. Area of pain seems to be at the R scalene muscle area based on subjective reports and clinical presentation. TTP to R scalene muscle area with reproduction of symptoms. R posterior scapula symptoms seem to be along the referral pain pattern  distribution for the scalenes as well. Added first rib stretch as part of her HEP. Fair tolerance to today's session. Pt will benefit from continued skilled physical therapy services  to decrease pain, improve strength, AROM, and function.        PATIENT EDUCATION: Education details: there-ex, HEP Person educated: Patient Education method: Explanation, Demonstration, Tactile cues, Verbal cues, and Handouts Education comprehension: verbalized understanding and returned demonstration  HOME EXERCISE PROGRAM: Access Code: [email or web address] URL: [email or web address] Date: 07/30/2022 Prepared by: Loralyn Freshwater  Exercises - Seated Scapular Retraction  - 2 x daily - 7 x weekly - 3 sets - 10 reps - 5 seconds hold - Supine Cervical Retraction with Towel  - 2 x daily - 7 x weekly - 3 sets - 10 reps - 5 seconds hold - Standing Shoulder External Rotation with Resistance  - 1 x daily - 7 x weekly - 3 sets - 10 reps - Seated Isometric Shoulder Internal Rotation at Chair  - 1 x daily - 7 x weekly - 3 sets - 10 reps - 5 seconds hold  - First Rib Mobilization with Strap  - 3 x daily - 7 x weekly - 1 sets - 3-5 reps - 30 seconds hold       PT Short Term Goals - 07/25/22 1702       PT SHORT TERM GOAL #1   Title Pt will be independent with her initial HEP to decrease pain, improve strength, and ability to reach up and behind her more comfortably.    Time 3    Period Weeks    Status New    Target Date 08/16/22              PT Long Term Goals - 07/25/22 1703       PT LONG TERM GOAL #1   Title Pt will have a decrease in R shoulder pain to 3/10 or less at worst to promote ability to reach up, reach behind her, as well as lay on her R side to sleep more comfortably.    Baseline 6/10 R shoulder pain at worst for the past 3 months (07/25/2022)    Time 8    Period Weeks    Status New    Target Date 09/20/22      PT LONG TERM GOAL #2   Title Pt will improve R shoulder FOTO score by at  least 10 points as a demonstration of improved function.    Baseline R shoulder FOTO 54 (07/25/2022)    Time 3    Period Weeks    Status New    Target Date 09/20/22      PT LONG TERM GOAL #3   Title Pt will improve R shoulder IR and ER strength by at least 1/2 MMT grade to promote ability to reach more comfortably.    Baseline R shoulder IR 4/5, ER 4/5 (07/25/2022)    Time 8    Period Weeks    Status New    Target Date 09/20/22      PT LONG TERM GOAL #4   Title Pt will improve R shoulder AROM to at least 150 degrees flexion and abduction to promote ability to reach more comfortably.    Baseline R shoulder AROM 117 degrees flexion, 128 degrees abduction (07/25/2022)    Time 8    Period Weeks    Status New    Target Date 09/20/22      PT LONG TERM GOAL #5   Title Pt will improve R shoulder functional IR to R thumb to T7 spinous process to promote ability to don and doff clothes as well s perform self care more comfortably.  Baseline R shoulder functional IR: R thumb to T10 spinous process (07/25/2022)    Time 8    Period Weeks    Status New    Target Date 09/20/22              Plan - 08/29/22 1509     Clinical Impression Statement Worked on improving lower trap strength to decrease R upper trap muscle tension and decreasing R scalene and levator scapula muscle tension to neck to decrease R superior shoulder pain. Area of pain seems to be at the R scalene muscle area based on subjective reports and clinical presentation. TTP to R scalene muscle area with reproduction of symptoms. R posterior scapula symptoms seem to be along the referral pain pattern distribution for the scalenes as well. Added first rib stretch as part of her HEP. Fair tolerance to today's session. Pt will benefit from continued skilled physical therapy services to decrease pain, improve strength, AROM, and function.    Personal Factors and Comorbidities Comorbidity 3+;Fitness;Past/Current Experience;Time since  onset of injury/illness/exacerbation    Comorbidities Anxiety, back pain, arthritis, CHF, depression, HTN, DM, fibromyalgia, neuropathy    Examination-Activity Limitations Bathing;Reach Overhead;Bed Mobility;Dressing;Hygiene/Grooming;Toileting;Lift;Sleep    Stability/Clinical Decision Making Evolving/Moderate complexity   Pt states pain is worsening overall since onset   Rehab Potential Fair    PT Frequency 2x / week    PT Duration 8 weeks    PT Treatment/Interventions Therapeutic activities;Functional mobility training;Therapeutic exercise;Neuromuscular re-education;Patient/family education;Manual techniques;Dry needling;Joint Manipulations;Spinal Manipulations;Electrical Stimulation;Iontophoresis 4mg /ml Dexamethasone;Traction   traction and/or manipulations if appropriate   PT Next Visit Plan thoracic extension, scapular, anterior cervical, ER, IR strengthening, manual techniques, modalities PRN    PT Home Exercise Plan medbridge Access Code: [email or web address]    Consulted and Agree with Plan of Care Patient                Loralyn Freshwater PT, DPT     08/29/2022, 4:11 PM

## 2022-08-30 ENCOUNTER — Ambulatory Visit (INDEPENDENT_AMBULATORY_CARE_PROVIDER_SITE_OTHER): Payer: HMO | Admitting: Psychology

## 2022-08-30 DIAGNOSIS — F339 Major depressive disorder, recurrent, unspecified: Secondary | ICD-10-CM

## 2022-08-30 NOTE — Progress Notes (Signed)
[facility] Counselor/Therapist Progress Note  Patient ID: Brandy [name], MRN: [id number]   Date: 08/30/22  Time Spent: 12:28 pm - 1:01  pm : 32 Minutes  Treatment Type: Individual Therapy.  Reported Symptoms: depression and anxiety.  Mental Status Exam: Appearance:  Casual     Behavior: Appropriate  Motor: Normal  Speech/Language:  Clear and Coherent  Affect: Congruent  Mood: normal  Thought process: normal  Thought content:   WNL  Sensory/Perceptual disturbances:   WNL  Orientation: oriented to person, place, time/date, and situation  Attention: Good  Concentration: Good  Memory: WNL  Fund of knowledge:  Good  Insight:   Good  Judgment:  Good  Impulse Control: Good   Risk Assessment: Danger to Self:  No Self-injurious Behavior: No Danger to Others: No Duty to Warn:no Physical Aggression / Violence:No  Access to Firearms a concern: No  Gang Involvement:No   Subjective:   Brandy [name] participated from home, via video and consented to treatment. Therapist participated from home office. We met online due to COVID pandemic. Brandy [name] reviewed the events of the past week.She noted making progress in her efforts towards self-care including being more consistent with exercise and dental care. We continues to process her need for attention and her reaction to this. We identified a possible insight that her mental health diagnosis, which she states is Bipolar 2, and discussed the connection to her hypomanic episodes. Therapist will request an updated release for two way communication with her psychiatrist. Therapist provided psycho-education regarding bipolar 2 and provided Brandy [name] a handout to identify her warning signs for depressive and hypomanic episodes. Brandy [name] was engaged and motivated during the session. Therapist provided supportive therapy.   Interventions:  CBT.   Diagnosis:  Episode of recurrent major depressive disorder, unspecified depression episode  severity [facility])  Psychiatric Treatment: Yes , [name].    Treatment Plan:  [name], self-aware, and motivated for change.   Support System: Family and friends.    Client Treatment Preferences Outpatient therapy.   Client Statement of Needs Brandy [name] would like to focus on self-care, eating healthfully, engaging in consistent exercise, focusing on sense of self,  being more organized and goal oriented, process past events including childhood, reduce negative self-talk.   Treatment Level Weekly  Symptoms  Depression: loss of interest, feeling down, poor sleep, lethargy, fluctuating appetite, feeling bad about self, and trouble concentrating.    (Status: maintained) Anxiety:  feeling anxious, difficulty managing worry, worrying about different things, trouble relaxing, restlessness, and feeling afraid as if something awful might happen.   (Status: maintained)  Goals:   Brandy [name] experiences symptoms of depression and anxiety.    Target Date: 07/19/23 Frequency: Weekly  Progress: 0 Modality: individual    Therapist will provide referrals for additional resources as appropriate.  Therapist will provide psycho-education regarding Brandy [name]'s diagnosis and corresponding treatment approaches and interventions. Licensed Clinical Social Worker, [place], LCSW will support the patient's ability to achieve the goals identified. will employ CBT, BA, Problem-solving, Solution Focused, Mindfulness,  coping skills, & other evidenced-based practices will be used to promote progress towards healthy functioning to help manage decrease symptoms associated with her diagnosis.   Reduce overall level, frequency, and intensity of the feelings of depression & anxiety and evidenced by decreased overall symptoms  from 6 to 7 days/week to 0 to 1 days/week per client report for at least 3 consecutive months. Verbally express understanding of the relationship between feelings  of depression,  anxiety and their impact on thinking patterns and behaviors. Verbalize an understanding of the role that distorted thinking plays in creating fears, excessive worry, and ruminations.    Brandy [name] participated in the creation of the treatment plan)    Brandy Ovens, LCSW

## 2022-09-04 ENCOUNTER — Ambulatory Visit: Payer: HMO

## 2022-09-04 ENCOUNTER — Telehealth: Payer: Self-pay

## 2022-09-04 DIAGNOSIS — G8929 Other chronic pain: Secondary | ICD-10-CM

## 2022-09-04 DIAGNOSIS — M542 Cervicalgia: Secondary | ICD-10-CM

## 2022-09-04 DIAGNOSIS — M25511 Pain in right shoulder: Secondary | ICD-10-CM | POA: Diagnosis not present

## 2022-09-04 DIAGNOSIS — M25611 Stiffness of right shoulder, not elsewhere classified: Secondary | ICD-10-CM

## 2022-09-04 NOTE — Telephone Encounter (Signed)
Lm for patient to ask if she currently wears cpap machine. If so, can she bring SD card to 5/8 appt.

## 2022-09-04 NOTE — Telephone Encounter (Signed)
Noted  Will discuss during OV

## 2022-09-04 NOTE — Telephone Encounter (Signed)
Pt states she has never used CPAP. Is looking for other options since her son has severe autism and gets freaked out easily

## 2022-09-04 NOTE — Therapy (Signed)
OUTPATIENT PHYSICAL THERAPY TREATMENT NOTE   Patient Name: Brandy [name] MRN: [id number] DOB:[birth year], 57 y.o., female Today's Date: 09/04/2022  PCP: Doreene Nest, NP  REFERRING PROVIDER: Hannah Beat, MD  END OF SESSION:  PT End of Session - 09/04/22 1524     Visit Number 8    Number of Visits 17    Date for PT Re-Evaluation 09/20/22    PT Start Time 1524    PT Stop Time 1620    PT Time Calculation (min) 56 min    Activity Tolerance Patient tolerated treatment well    Behavior During Therapy WFL for tasks assessed/performed                   Past Medical History:  Diagnosis Date   Anxiety    Arthritis    Back pain    Brain fog    CHF (congestive heart failure) (HCC)    pt not aware of this   Depression    Diabetes mellitus without complication (HCC)    Difficulty walking    Dysmetabolic syndrome X    Fatigue    Fibromyalgia    neuropathy   Hypertension    Hypertriglyceridemia    Joint pain    Leg weakness    Lower extremity edema    Muscle atrophy    Neuropathy    Obesity    Osteoarthritis    PONV (postoperative nausea and vomiting)    Poor memory    Post-menopausal bleeding    Prediabetes    Rosacea    Sleep apnea    not on cpap yet   Tetanus vaccine causing adverse effect in therapeutic use    Vitamin D deficiency    Past Surgical History:  Procedure Laterality Date   BONE TUMOR EXCISION  1975   Right leg   DILATATION & CURETTAGE/HYSTEROSCOPY WITH MYOSURE N/A 10/07/2019   Procedure: DILATATION AND CURETTAGE /HYSTEROSCOPY, Myosure, Polypectomy;  Surgeon: Gravois Mills Bing, MD;  Location: [facility] OR;  Service: Gynecology;  Laterality: N/A;   ENDOMETRIAL BIOPSY  07/14/2019       epidural steroid injection     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   Procedure: LAPAROSCOPIC ROUX-EN-Y GASTRIC BYPASS WITH UPPER ENDOSCOPY;  Surgeon: Sheliah Hatch, De Blanch, MD;  Location: [facility] OR[facility];  Service: General;  Laterality: N/A;   Patient Active Problem List    Diagnosis Date Noted   GERD (gastroesophageal reflux disease) 07/03/2022   Preventative health care 07/03/2022   S/P gastric bypass 06/20/2021   Morbid (severe) obesity due to excess calories (HCC) 04/04/2021   Near syncope 10/04/2020   Severe obstructive sleep apnea-hypopnea syndrome 09/23/2019   Class 2 obesity due to excess calories with body mass index (BMI) of 38.0 to 38.9 in adult 09/23/2019   Excessive daytime sleepiness 08/07/2019   Acute diastolic congestive heart failure (HCC) 08/07/2019   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 07/07/2019   Post-menopausal bleeding 06/17/2019   Vaginal burning 06/17/2019   Urinary incontinence 06/17/2019   Lower extremity weakness 05/13/2019   Poor short term memory 05/13/2019   Left groin pain 03/18/2019   Rosacea 08/25/2018   Chronic venous insufficiency 08/07/2018   PAD (peripheral artery disease) (HCC) 08/07/2018   Chronic bilateral low back pain with left-sided sciatica 07/04/2018   Vitamin B12 deficiency 05/23/2018   Arthralgia of multiple sites 05/01/2018   Abnormal TSH 03/17/2018   GAD (generalized anxiety disorder) 02/24/2018   Vitamin D deficiency 02/24/2018   Bilateral hand pain 04/06/2014  Bilateral wrist pain 04/06/2014   Chronic fatigue 03/15/2014   Palpitations 09/20/2010   Essential hypertension, benign 09/14/2010   HYPERTRIGLYCERIDEMIA 10/04/2009   Lower extremity pain 10/04/2009   Type 2 diabetes mellitus with hyperglycemia (HCC) 08/12/2009   OBESITY, MORBID 08/12/2009   MDD (major depressive disorder) 08/12/2009    REFERRING DIAG: M75.41 (ICD-10-CM) - Impingement syndrome of right shoulder M75.81 (ICD-10-CM) - Rotator cuff tendonitis, right   THERAPY DIAG:  Chronic right shoulder pain  Cervicalgia  Stiffness of right shoulder, not elsewhere classified  Rationale for Evaluation and Treatment Rehabilitation  PERTINENT HISTORY: M75.41 (ICD-10-CM) - Impingement syndrome of right shoulder  M75.81 (ICD-10-CM) - Rotator cuff tendonitis, right. Pain would be at posterior R shoulder, upper trap area, R shoulder joint, and R arm. Got a cortisone shot on 07/11/2022 which helped. Pain began about 2 years ago, gradual onset. Pt is R hand dominant. Pain has overall worsened since onset, but the cortisone shot provided some relief. Pt also reports R lateral neck pain.   PRECAUTIONS: No known precautions   SUBJECTIVE:   SUBJECTIVE STATEMENT: R shoulder (R upper trap/levator scapula area) just feels a little sore, 3/10 currently. R shoulder joint is 2/10 currently .      PAIN:  Are you having pain? See subjective   TODAY'S TREATMENT:                                                                                                                                         DATE: 09/04/2022  Blood pressure is controlled.  DM controlled.    No latex allergies.    Pressure to R upper trap feels better per pt.    TTP R distal scalene area       Therapeutic exercises  Standing R shoulder AROM   Flexion: 121 degrees  Abduction 106 degrees   Shoulder ER B with yellow band, pain free range 10x3  Seated B scapular retraction red band 10x2 with 5 second holds   Seated chin tucks 10x5 seconds  Supine chin tucks with cervical towel roll 10x3 with 5 second holds  (+) apprehension test, decreased pain with A to P pressure to humeral head.   Supine with R arm in 90 degrees flexion,   Triceps extension 10x2  Good posterior glide of R humeral head palpated.     Improved exercise technique, movement at target joints, use of target muscles after mod verbal, visual, tactile cues.    Manual therapy  Supine with R arm in flexion   Posterior lateral glide to R humeral head grade 3   Decreased R shoulder pain with flexion     Response to treatment Improved R shoulder flexion AROM to 142 degrees with not much pain after session.     Clinical impression Demonstrates posterior  internal shoulder impingement. Improved R shoulder flexion AROM and comfort level after treatment to improve posterior and inferior  glide of R humeral head. Pt will benefit from continued skilled physical therapy services to decrease pain, improve strength, AROM, and function.        PATIENT EDUCATION: Education details: there-ex, HEP Person educated: Patient Education method: Explanation, Demonstration, Tactile cues, Verbal cues, and Handouts Education comprehension: verbalized understanding and returned demonstration  HOME EXERCISE PROGRAM: Access Code: [email or web address] URL: [email or web address] Date: 07/30/2022 Prepared by: Loralyn Freshwater  Exercises - Seated Scapular Retraction  - 2 x daily - 7 x weekly - 3 sets - 10 reps - 5 seconds hold - Supine Cervical Retraction with Towel  - 2 x daily - 7 x weekly - 3 sets - 10 reps - 5 seconds hold - Standing Shoulder External Rotation with Resistance  - 1 x daily - 7 x weekly - 3 sets - 10 reps - Seated Isometric Shoulder Internal Rotation at Chair  - 1 x daily - 7 x weekly - 3 sets - 10 reps - 5 seconds hold  - First Rib Mobilization with Strap  - 3 x daily - 7 x weekly - 1 sets - 3-5 reps - 30 seconds hold  - Supine Elbow Flexion Extension AROM  - 1 x daily - 7 x weekly - 3 sets - 10 reps - 5 second hold     PT Short Term Goals - 07/25/22 1702       PT SHORT TERM GOAL #1   Title Pt will be independent with her initial HEP to decrease pain, improve strength, and ability to reach up and behind her more comfortably.    Time 3    Period Weeks    Status New    Target Date 08/16/22              PT Long Term Goals - 09/04/22 1528       PT LONG TERM GOAL #1   Title Pt will have a decrease in R shoulder pain to 3/10 or less at worst to promote ability to reach up, reach behind her, as well as lay on her R side to sleep more comfortably.    Baseline 6/10 R shoulder pain at worst for the past 3 months (07/25/2022); 6.5/10 at  most for the past 7 days (09/04/2022)    Time 8    Period Weeks    Status On-going    Target Date 09/20/22      PT LONG TERM GOAL #2   Title Pt will improve R shoulder FOTO score by at least 10 points as a demonstration of improved function.    Baseline R shoulder FOTO 54 (07/25/2022)    Time 3    Period Weeks    Status New    Target Date 09/20/22      PT LONG TERM GOAL #3   Title Pt will improve R shoulder IR and ER strength by at least 1/2 MMT grade to promote ability to reach more comfortably.    Baseline R shoulder IR 4/5, ER 4/5 (07/25/2022)    Time 8    Period Weeks    Status New    Target Date 09/20/22      PT LONG TERM GOAL #4   Title Pt will improve R shoulder AROM to at least 150 degrees flexion and abduction to promote ability to reach more comfortably.    Baseline R shoulder AROM 117 degrees flexion, 128 degrees abduction (07/25/2022); 121 degrees flexion, 106 degrees abduction but can go to about 139 degrees AAROM (09/04/2022)  Time 8    Period Weeks    Status On-going    Target Date 09/20/22      PT LONG TERM GOAL #5   Title Pt will improve R shoulder functional IR to R thumb to T7 spinous process to promote ability to don and doff clothes as well s perform self care more comfortably.    Baseline R shoulder functional IR: R thumb to T10 spinous process (07/25/2022)    Time 8    Period Weeks    Status New    Target Date 09/20/22              Plan - 09/04/22 1523     Personal Factors and Comorbidities Comorbidity 3+;Fitness;Past/Current Experience;Time since onset of injury/illness/exacerbation    Comorbidities Anxiety, back pain, arthritis, CHF, depression, HTN, DM, fibromyalgia, neuropathy    Examination-Activity Limitations Bathing;Reach Overhead;Bed Mobility;Dressing;Hygiene/Grooming;Toileting;Lift;Sleep    Stability/Clinical Decision Making Evolving/Moderate complexity   Pt states pain is worsening overall since onset   Rehab Potential Fair    PT  Frequency 2x / week    PT Duration 8 weeks    PT Treatment/Interventions Therapeutic activities;Functional mobility training;Therapeutic exercise;Neuromuscular re-education;Patient/family education;Manual techniques;Dry needling;Joint Manipulations;Spinal Manipulations;Electrical Stimulation;Iontophoresis 4mg /ml Dexamethasone;Traction   traction and/or manipulations if appropriate   PT Next Visit Plan thoracic extension, scapular, anterior cervical, ER, IR strengthening, manual techniques, modalities PRN    PT Home Exercise Plan medbridge Access Code: [email or web address]    Consulted and Agree with Plan of Care Patient                Loralyn Freshwater PT, DPT     09/04/2022, 4:34 PM

## 2022-09-05 ENCOUNTER — Ambulatory Visit (INDEPENDENT_AMBULATORY_CARE_PROVIDER_SITE_OTHER): Payer: HMO | Admitting: Internal Medicine

## 2022-09-05 ENCOUNTER — Encounter: Payer: Self-pay | Admitting: Internal Medicine

## 2022-09-05 VITALS — BP 130/70 | HR 64 | Temp 97.7°F | Ht 66.5 in | Wt 246.2 lb

## 2022-09-05 DIAGNOSIS — G4719 Other hypersomnia: Secondary | ICD-10-CM | POA: Diagnosis not present

## 2022-09-05 DIAGNOSIS — G4733 Obstructive sleep apnea (adult) (pediatric): Secondary | ICD-10-CM | POA: Diagnosis not present

## 2022-09-05 NOTE — Patient Instructions (Addendum)
Recommend Home Sleep Study to assess for Sleep apnea   KEEP PUSHING!!!!

## 2022-09-05 NOTE — Progress Notes (Signed)
Name: Brandy [name] MRN: [id number] DOB: 26-May-1965    CHIEF COMPLAINT:  EXCESSIVE DAYTIME SLEEPINESS   HISTORY OF PRESENT ILLNESS: Patient is seen today for problems and issues with sleep related to excessive daytime sleepiness Patient  has been having sleep problems for many years Patient has been having excessive daytime sleepiness for a long time Patient has been having extreme fatigue and tiredness, lack of energy +  very Loud snoring every night + morning headaches + Nonrefreshing sleep   EPWORTH SLEEP SCORE 13  Patient states she has been diagnosed with severe sleep apnea in the past however has never started CPAP therapy Patient previously weighed 407 pounds status post gastric bypass currently is on 247 pounds Patient unable to use CPAP due to family situation with autistic son  We have discussed several options including inspire device as well as oral device At this time we will need sleep study for further assessment Recommendation is to keep pushing and losing weight     PAST MEDICAL HISTORY :   has a past medical history of Anxiety, Arthritis, Back pain, Brain fog, CHF (congestive heart failure) (HCC), Depression, Diabetes mellitus without complication (HCC), Difficulty walking, Dysmetabolic syndrome X, Fatigue, Fibromyalgia, Hypertension, Hypertriglyceridemia, Joint pain, Leg weakness, Lower extremity edema, Muscle atrophy, Neuropathy, Obesity, Osteoarthritis, PONV (postoperative nausea and vomiting), Poor memory, Post-menopausal bleeding, Prediabetes, Rosacea, Sleep apnea, Tetanus vaccine causing adverse effect in therapeutic use, and Vitamin D deficiency.  has a past surgical history that includes Bone tumor excision (1975); Endometrial biopsy (07/14/2019); epidural steroid injection; Dilatation & curettage/hysteroscopy with myosure (N/A, 10/07/2019); and Gastric Roux-En-Y (N/A, 04/04/2021). Prior to Admission medications   Medication Sig Start Date End Date Taking?  Authorizing Provider  glucose blood (ONETOUCH VERIO) test strip TEST BLOOD SUGAR MORNING, NOON AND AT BEDTIME 08/07/22   Doreene Nest, NP  acetaminophen (TYLENOL) 650 MG CR tablet Take 1,300 mg by mouth every 8 (eight) hours as needed for pain.    [provider]  atorvastatin (LIPITOR) 10 MG tablet TAKE 1 TABLET BY MOUTH EVERY DAY FOR CHOLESTEROL 07/31/21   Doreene Nest, NP  Blood Glucose Monitoring Suppl (ONETOUCH VERIO REFLECT) w/Device KIT 3 (three) times daily. 07/06/22   [provider]  Blood Glucose Monitoring Suppl DEVI 1 each by Does not apply route in the morning, at noon, and at bedtime. May substitute to any manufacturer covered by patient's insurance. 07/06/22   Doreene Nest, NP  cetirizine (ZYRTEC) 10 MG tablet TAKE 1 TABLET (10 MG TOTAL) BY MOUTH DAILY. FOR ALLERGIES 07/30/22   Doreene Nest, NP  citalopram (CELEXA) 40 MG tablet Take 40 mg by mouth daily. 03/31/20   [provider]  DULoxetine (CYMBALTA) 30 MG capsule Take 30 mg by mouth daily. Take with 60 mg to equal 90 mg daily    [provider]  DULoxetine (CYMBALTA) 60 MG capsule Take 60 mg by mouth daily. Take with 30 mg to equal 90 mg daily    [provider]  folic acid (FOLVITE) 1 MG tablet Take 1 mg by mouth daily. 02/23/19   [provider]  gabapentin (NEURONTIN) 300 MG capsule TAKE 1 CAPSULE BY MOUTH 3 TIMES A DAY AS NEEDED FOR BACK PAIN 10/27/20   Doreene Nest, NP  lamoTRIgine (LAMICTAL) 100 MG tablet Take 100 mg by mouth every evening. 08/11/20   [provider]  lamoTRIgine (LAMICTAL) 200 MG tablet Take 200 mg by mouth in the morning. 04/28/20   [provider]  lisinopril (ZESTRIL) 30 MG tablet TAKE 1 TABLET (30 MG TOTAL) BY MOUTH DAILY. FOR BLOOD PRESSURE 07/31/21   Doreene Nest, NP  metroNIDAZOLE (METROCREAM) 0.75 % cream Apply 1 application topically daily as needed (rosacea).    [provider]  Multiple  Minerals-Vitamins (CALCIUM-MAGNESIUM-ZINC-D3) TABS Take 2 tablets by mouth daily.    [provider]  ondansetron (ZOFRAN-ODT) 4 MG disintegrating tablet Dissolve 1 tablet (4 mg total) by mouth every 6 (six) hours as needed for nausea or vomiting. 04/05/21   Kinsinger, De Blanch, MD  pantoprazole (PROTONIX) 40 MG tablet Take 1 tablet (40 mg total) by mouth daily. 04/05/21   Kinsinger, De Blanch, MD  scopolamine (TRANSDERM-SCOP) 1 MG/3DAYS Place 1 patch (1.5 mg total) onto the skin every 3 (three) days. 07/03/22   Doreene Nest, NP  sucralfate (CARAFATE) 1 g tablet Take by mouth. 09/06/21 09/06/22  [provider]  traZODone (DESYREL) 50 MG tablet Take 50 mg by mouth at bedtime as needed for sleep. 04/22/20   [provider]  Vitamin D, Ergocalciferol, (DRISDOL) 1.25 MG (50000 UNIT) CAPS capsule TAKE 1 CAPSULE BY MOUTH ONCE WEEKLY FOR VITAMIN D. 08/01/21   Doreene Nest, NP   Allergies  Allergen Reactions   Diphenhydramine Hcl Rash    minor rash   Penicillins Rash    FAMILY HISTORY:  family history includes Alcohol abuse in her maternal grandfather and maternal grandmother; Alcoholism in her father; Aneurysm in her paternal grandmother; Anxiety disorder in her mother; Autism in her son; COPD in her mother; Depression in her father and mother; Diabetes in her paternal aunt; Eating disorder in her mother; Heart disease in her maternal grandmother; Hypertension in her father; Obesity in her mother; Rheum arthritis in her mother; Stroke in her maternal grandmother and mother. SOCIAL HISTORY:  reports that she has never smoked. She has never used smokeless tobacco. She reports that she does not currently use alcohol. She reports that she does not use drugs.   Review of Systems:  Gen:  Denies  fever, sweats, chills weight loss  HEENT: Denies blurred vision, double vision, ear pain, eye pain, hearing loss, nose bleeds, sore throat Cardiac:  No dizziness, chest pain or  heaviness, chest tightness,edema, No JVD Resp:   No cough, -sputum production, -shortness of breath,-wheezing, -hemoptysis,  Gi: Denies swallowing difficulty, stomach pain, nausea or vomiting, diarrhea, constipation, bowel incontinence Gu:  Denies bladder incontinence, burning urine Ext:   Denies Joint pain, stiffness or swelling Skin: Denies  skin rash, easy bruising or bleeding or hives Endoc:  Denies polyuria, polydipsia , polyphagia or weight change Psych:   Denies depression, insomnia or hallucinations  Other:  All other systems negative   ALL OTHER ROS ARE NEGATIVE  BP 130/70 (BP Location: Left Wrist, Cuff Size: Normal)   Pulse 64   Temp 97.7 F (36.5 C) (Temporal)   Ht 5' 6.5" (1.689 m)   Wt 246 lb 3.2 oz (111.7 kg)   LMP  (LMP Unknown)   SpO2 98%   BMI 39.14 kg/m    Physical Examination:   General Appearance: No distress  EYES PERRLA, EOM intact.   NECK Supple, No JVD Pulmonary: normal breath sounds, No wheezing.  CardiovascularNormal S1,S2.  No m/r/g.   Abdomen: Benign, Soft, non-tender. Skin:   warm, no rashes, no ecchymosis  Extremities: normal, no cyanosis, clubbing. Neuro:without focal findings,  speech normal  PSYCHIATRIC: Mood, affect within normal limits.   ALL OTHER ROS ARE NEGATIVE  ASSESSMENT AND PLAN SYNOPSIS  Patient with signs and symptoms of excessive daytime sleepiness with  underlying diagnosis of obstructive sleep apnea in the setting of obesity and deconditioned state   Recommend Sleep Study for definitve diagnosis  Obesity -recommend significant weight loss -recommend changing diet  Deconditioned state -Recommend increased daily activity and exercise   MEDICATION ADJUSTMENTS/LABS AND TESTS ORDERED: Recommend Sleep Study Recommend weight loss   CURRENT MEDICATIONS REVIEWED AT LENGTH WITH PATIENT TODAY   Patient  satisfied with Plan of action and management. All questions answered  Follow up  3 months  Total Time Spent   35 mins   Wallis Bamberg Santiago Glad, M.D.  [facility]  Medical Director [facility] Medical Director [facility] Cardio-Pulmonary Department

## 2022-09-06 ENCOUNTER — Ambulatory Visit (INDEPENDENT_AMBULATORY_CARE_PROVIDER_SITE_OTHER): Payer: HMO | Admitting: Psychology

## 2022-09-06 ENCOUNTER — Ambulatory Visit: Payer: HMO

## 2022-09-06 DIAGNOSIS — F339 Major depressive disorder, recurrent, unspecified: Secondary | ICD-10-CM | POA: Diagnosis not present

## 2022-09-06 NOTE — Progress Notes (Signed)
[facility] Counselor/Therapist Progress Note  Patient ID: ATIA KOLANOWSKI, MRN: [id number]   Date: 09/06/22  Time Spent: 12:05 pm - 12:57 pm : 52 Minutes  Treatment Type: Individual Therapy.  Reported Symptoms: depression and anxiety.  Mental Status Exam: Appearance:  Casual     Behavior: Appropriate  Motor: Normal  Speech/Language:  Clear and Coherent  Affect: Congruent  Mood: normal  Thought process: normal  Thought content:   WNL  Sensory/Perceptual disturbances:   WNL  Orientation: oriented to person, place, time/date, and situation  Attention: Good  Concentration: Good  Memory: WNL  Fund of knowledge:  Good  Insight:   Good  Judgment:  Good  Impulse Control: Good   Risk Assessment: Danger to Self:  No Self-injurious Behavior: No Danger to Others: No Duty to Warn:no Physical Aggression / Violence:No  Access to Firearms a concern: No  Gang Involvement:No   Subjective:   Tayja Hedinger Hainer participated from home, via video and consented to treatment. Therapist participated from home office. We met online due to COVID pandemic. Ceazia reviewed the events of the past week. She will work on signing a release for her psychiatrist as her previous release expired. [name] noted her husband communicating with positive words but with an "attitude". She noted her attempts to communicate about this and noted frustration regarding the lack of acknowledgement from Ed regarding his behavior. She noted feeling pride regarding her progress in her health, overall. She noted a need to eliminate her sugar intake due to recognition that she is consuming too much sugar. We worked on identifying varying possible perspective in regards to Ed giving her feedback about an issue he was experiencing. She noted frustration regarding the current political climate. We worked on exploring this during the session. Therapist validated and normalized Kanija's feelings and experience. Senovia was engaged  and motivated during the session. Therapist provided supportive therapy.   Interventions:  Interpersonal.   Diagnosis:  Episode of recurrent major depressive disorder, unspecified depression episode severity (HCC)  Psychiatric Treatment: Yes , [name].    Treatment Plan:  Client Abilities/Strengths Eniyah is intelligent, self-aware, and motivated for change.   Support System: Family and friends.    Client Treatment Preferences Outpatient therapy.   Client Statement of Needs Jeriyah would like to focus on self-care, eating healthfully, engaging in consistent exercise, focusing on sense of self,  being more organized and goal oriented, process past events including childhood, reduce negative self-talk.   Treatment Level Weekly  Symptoms  Depression: loss of interest, feeling down, poor sleep, lethargy, fluctuating appetite, feeling bad about self, and trouble concentrating.    (Status: maintained) Anxiety:  feeling anxious, difficulty managing worry, worrying about different things, trouble relaxing, restlessness, and feeling afraid as if something awful might happen.   (Status: maintained)  Goals:   Jenavive experiences symptoms of depression and anxiety.    Target Date: 07/19/23 Frequency: Weekly  Progress: 0 Modality: individual    Therapist will provide referrals for additional resources as appropriate.  Therapist will provide psycho-education regarding Nydia's diagnosis and corresponding treatment approaches and interventions. Licensed Clinical Social Worker, [place], LCSW will support the patient's ability to achieve the goals identified. will employ CBT, BA, Problem-solving, Solution Focused, Mindfulness,  coping skills, & other evidenced-based practices will be used to promote progress towards healthy functioning to help manage decrease symptoms associated with her diagnosis.   Reduce overall level, frequency, and intensity of the feelings of depression & anxiety and  evidenced by  decreased overall symptoms  from 6 to 7 days/week to 0 to 1 days/week per client report for at least 3 consecutive months. Verbally express understanding of the relationship between feelings of depression, anxiety and their impact on thinking patterns and behaviors. Verbalize an understanding of the role that distorted thinking plays in creating fears, excessive worry, and ruminations.    [name] participated in the creation of the treatment plan)    [name], LCSW

## 2022-09-10 ENCOUNTER — Ambulatory Visit: Payer: HMO

## 2022-09-13 ENCOUNTER — Ambulatory Visit: Payer: HMO

## 2022-09-13 ENCOUNTER — Ambulatory Visit: Payer: HMO | Admitting: Psychology

## 2022-09-18 DIAGNOSIS — F5105 Insomnia due to other mental disorder: Secondary | ICD-10-CM | POA: Diagnosis not present

## 2022-09-18 DIAGNOSIS — F3181 Bipolar II disorder: Secondary | ICD-10-CM | POA: Diagnosis not present

## 2022-09-18 DIAGNOSIS — F411 Generalized anxiety disorder: Secondary | ICD-10-CM | POA: Diagnosis not present

## 2022-09-18 DIAGNOSIS — R41840 Attention and concentration deficit: Secondary | ICD-10-CM | POA: Diagnosis not present

## 2022-09-20 ENCOUNTER — Ambulatory Visit: Payer: HMO

## 2022-09-20 ENCOUNTER — Ambulatory Visit (INDEPENDENT_AMBULATORY_CARE_PROVIDER_SITE_OTHER): Payer: HMO | Admitting: Psychology

## 2022-09-20 DIAGNOSIS — M542 Cervicalgia: Secondary | ICD-10-CM

## 2022-09-20 DIAGNOSIS — F339 Major depressive disorder, recurrent, unspecified: Secondary | ICD-10-CM

## 2022-09-20 DIAGNOSIS — M25511 Pain in right shoulder: Secondary | ICD-10-CM | POA: Diagnosis not present

## 2022-09-20 DIAGNOSIS — M25611 Stiffness of right shoulder, not elsewhere classified: Secondary | ICD-10-CM

## 2022-09-20 DIAGNOSIS — G8929 Other chronic pain: Secondary | ICD-10-CM

## 2022-09-20 NOTE — Progress Notes (Signed)
[facility] Counselor/Therapist Progress Note  Patient ID: Brandy [name], MRN: [id number]   Date: 09/20/22  Time Spent: 12:12 pm - 1:01 pm : 49 Minutes  Treatment Type: Individual Therapy.  Reported Symptoms: depression and anxiety.  Mental Status Exam: Appearance:  NA     Behavior: Appropriate  Motor: na  Speech/Language:  Clear and Coherent  Affect: Congruent  Mood: normal  Thought process: normal  Thought content:   WNL  Sensory/Perceptual disturbances:   WNL  Orientation: oriented to person, place, time/date, and situation  Attention: Good  Concentration: Good  Memory: WNL  Fund of knowledge:  Good  Insight:   Good  Judgment:  Good  Impulse Control: Good   Risk Assessment: Danger to Self:  No Self-injurious Behavior: No Danger to Others: No Duty to Warn:no Physical Aggression / Violence:No  Access to Firearms a concern: No  Gang Involvement:No   Subjective:   Brandy [name] participated from home, via phone due to technical difficulties connecting despite numerous attempts, and consented to treatment. Therapist participated from home office. We met online due to COVID pandemic. Brandy [name] reviewed the events of the past week. Brandy [name] noted being more mindful during the morning and pushing to get out of bed as soon as she wakes up. Therapist praised Brandy [name] for this. She noted completing a release so that psychiatric records can be requested. She noted a recent onset of unexpected crying. 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  She noticed changes coming suddenly and unexpectedly.  We worked on processing this and Brandy [name] noted that she has also had an increase drive to impulse purchase.  She wonders if this is part of her possible diagnosis of bipolar.  We worked on insuring mindfulness of symptoms, coping with stressors, and working on identifying if  additional symptoms can be identified including decreased need for sleep, etc.  Brandy [name] noted the possibility of speaking with her psychiatrist regarding this and therapist supported this.  Brandy [name] noted previously bringing this up to her psychiatrist during the most recent visit. Therapist provided psychoeducation regarding bipolar.  Therapist validated therapist encouraged therapist encouraged mindfulness regarding this.  We will follow-up scheduled next week to continue processing this going forward.  Therapist provided supportive therapy.   Interventions:  CBT & psycho-education.   Diagnosis:  Episode of recurrent major depressive disorder, unspecified depression episode severity [facility])  Psychiatric Treatment: Yes , [name].    Treatment Plan:  [name], self-aware, and motivated for change.   Support System: Family and friends.    Client Treatment Preferences Outpatient therapy.   Client Statement of Needs Brandy [name] would like to focus on self-care, eating healthfully, engaging in consistent exercise, focusing on sense of self,  being more organized and goal oriented, process past events including childhood, reduce negative self-talk.   Treatment Level Weekly  Symptoms  Depression: loss of interest, feeling down, poor sleep, lethargy, fluctuating appetite, feeling bad about self, and trouble concentrating.    (Status: maintained) Anxiety:  feeling anxious, difficulty managing worry, worrying about different things, trouble relaxing, restlessness, and feeling afraid as if something awful might happen.   (Status: maintained)  Goals:   Brandy [name] experiences symptoms of depression and anxiety.    Target Date: 07/19/23 Frequency: Weekly  Progress: 0 Modality: individual    Therapist will provide referrals for additional resources as appropriate.  Therapist will provide psycho-education regarding Brandy [name]'s diagnosis and corresponding treatment approaches  and interventions. Licensed Clinical Social Worker, [place], LCSW will support the patient's ability to achieve the goals identified. will employ CBT, BA, Problem-solving, Solution Focused, Mindfulness,  coping skills, & other evidenced-based practices will be used to promote progress towards healthy functioning to help manage decrease symptoms associated with her diagnosis.   Reduce overall level, frequency, and intensity of the feelings of depression & anxiety and evidenced by decreased overall symptoms  from 6 to 7 days/week to 0 to 1 days/week per client report for at least 3 consecutive months. Verbally express understanding of the relationship between feelings of depression, anxiety and their impact on thinking patterns and behaviors. Verbalize an understanding of the role that distorted thinking plays in creating fears, excessive worry, and ruminations.    Brandy [name] participated in the creation of the treatment plan)    [name], LCSW

## 2022-09-20 NOTE — Therapy (Signed)
OUTPATIENT PHYSICAL THERAPY TREATMENT NOTE   Patient Name: Brandy [name] MRN: [id number] DOB:03/09/1966, 57 y.o., female Today's Date: 09/20/2022  PCP: Doreene Nest, NP  REFERRING PROVIDER: Hannah Beat, MD  END OF SESSION:  PT End of Session - 09/20/22 0956     Visit Number 9    Number of Visits 17    Date for PT Re-Evaluation 11/16/22    PT Start Time 0957   pt arrived late   PT Stop Time 1041    PT Time Calculation (min) 44 min    Activity Tolerance Patient tolerated treatment well    Behavior During Therapy WFL for tasks assessed/performed                    Past Medical History:  Diagnosis Date   Anxiety    Arthritis    Back pain    Brain fog    CHF (congestive heart failure) (HCC)    pt not aware of this   Depression    Diabetes mellitus without complication (HCC)    Difficulty walking    Dysmetabolic syndrome X    Fatigue    Fibromyalgia    neuropathy   Hypertension    Hypertriglyceridemia    Joint pain    Leg weakness    Lower extremity edema    Muscle atrophy    Neuropathy    Obesity    Osteoarthritis    PONV (postoperative nausea and vomiting)    Poor memory    Post-menopausal bleeding    Prediabetes    Rosacea    Sleep apnea    not on cpap yet   Tetanus vaccine causing adverse effect in therapeutic use    Vitamin D deficiency    Past Surgical History:  Procedure Laterality Date   BONE TUMOR EXCISION  1975   Right leg   DILATATION & CURETTAGE/HYSTEROSCOPY WITH MYOSURE N/A 10/07/2019   Procedure: DILATATION AND CURETTAGE /HYSTEROSCOPY, Myosure, Polypectomy;  Surgeon: Kenton Bing, MD;  Location: [facility] OR;  Service: Gynecology;  Laterality: N/A;   ENDOMETRIAL BIOPSY  07/14/2019       epidural steroid injection     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   Procedure: LAPAROSCOPIC ROUX-EN-Y GASTRIC BYPASS WITH UPPER ENDOSCOPY;  Surgeon: Sheliah Hatch, De Blanch, MD;  Location: [facility];  Service: General;  Laterality: N/A;   Patient  Active Problem List   Diagnosis Date Noted   GERD (gastroesophageal reflux disease) 07/03/2022   Preventative health care 07/03/2022   S/P gastric bypass 06/20/2021   Morbid (severe) obesity due to excess calories (HCC) 04/04/2021   Near syncope 10/04/2020   Severe obstructive sleep apnea-hypopnea syndrome 09/23/2019   Class 2 obesity due to excess calories with body mass index (BMI) of 38.0 to 38.9 in adult 09/23/2019   Excessive daytime sleepiness 08/07/2019   Acute diastolic congestive heart failure (HCC) 08/07/2019   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 07/07/2019   Post-menopausal bleeding 06/17/2019   Vaginal burning 06/17/2019   Urinary incontinence 06/17/2019   Lower extremity weakness 05/13/2019   Poor short term memory 05/13/2019   Left groin pain 03/18/2019   Rosacea 08/25/2018   Chronic venous insufficiency 08/07/2018   PAD (peripheral artery disease) (HCC) 08/07/2018   Chronic bilateral low back pain with left-sided sciatica 07/04/2018   Vitamin B12 deficiency 05/23/2018   Arthralgia of multiple sites 05/01/2018   Abnormal TSH 03/17/2018   GAD (generalized anxiety disorder) 02/24/2018   Vitamin D deficiency 02/24/2018  Bilateral hand pain 04/06/2014   Bilateral wrist pain 04/06/2014   Chronic fatigue 03/15/2014   Palpitations 09/20/2010   Essential hypertension, benign 09/14/2010   HYPERTRIGLYCERIDEMIA 10/04/2009   Lower extremity pain 10/04/2009   Type 2 diabetes mellitus with hyperglycemia (HCC) 08/12/2009   OBESITY, MORBID 08/12/2009   MDD (major depressive disorder) 08/12/2009    REFERRING DIAG: M75.41 (ICD-10-CM) - Impingement syndrome of right shoulder M75.81 (ICD-10-CM) - Rotator cuff tendonitis, right   THERAPY DIAG:  Chronic right shoulder pain - Plan: PT plan of care cert/re-cert  Cervicalgia - Plan: PT plan of care cert/re-cert  Stiffness of right shoulder, not elsewhere classified - Plan: PT plan of care  cert/re-cert  Rationale for Evaluation and Treatment Rehabilitation  PERTINENT HISTORY: M75.41 (ICD-10-CM) - Impingement syndrome of right shoulder M75.81 (ICD-10-CM) - Rotator cuff tendonitis, right. Pain would be at posterior R shoulder, upper trap area, R shoulder joint, and R arm. Got a cortisone shot on 07/11/2022 which helped. Pain began about 2 years ago, gradual onset. Pt is R hand dominant. Pain has overall worsened since onset, but the cortisone shot provided some relief. Pt also reports R lateral neck pain.   PRECAUTIONS: No known precautions   SUBJECTIVE:   SUBJECTIVE STATEMENT: R low back/hip is bothering her; not hurting right now but pops up now and again. Missed last session due to back pain. Went to a cruise for 3 days. Did not do any of her HEP. Wants to continue with her shoulder prior to working on her back. Might go to [place] 4.      PAIN:  Are you having pain? See subjective   TODAY'S TREATMENT:                                                                                                                                         DATE: 09/20/2022  Blood pressure is controlled.  DM controlled.    No latex allergies.    Pressure to R upper trap feels better per pt.    TTP R distal scalene area       Therapeutic exercises  Standing manually resisted R shoulder IR and ER  Standing R shoulder AROM 1x each way  Standing functional R shoulder IR 1x  Reviewed progress/current status with PT towards goals  Supine with R arm in 90 degrees flexion,   Triceps extension isometrics at about 70 degrees elbow flexion 10x3 with 5 second holds   Triceps extension AROM 10x5 seconds    Good posterior glide of R humeral head palpated.      Improved exercise technique, movement at target joints, use of target muscles after mod verbal, visual, tactile cues.     Manual therapy Supine with R arm in flexion   Posterior lateral glide to R humeral head grade  3  Improved R shoulder flexion AROM to 140 degrees  Supine with R arm  in abduction  Posterior glide to R humeral head grade 3        Response to treatment Fair tolerance to today's session.     Clinical impression  Pt returns to PT after a 16 day hiatus to to back pain and being on vacation/cruise. Has not done her home exercises. Pt currently demonstrates slight increased R shoulder flexion AROM since initial evaluation. Maintained ER and IR muscle strength. Pt however demonstrates decreased R shoulder functional IR and increased worst pain level to 8/10 in which not performing her home exercise program may play a factor. Continued working on improving posterior and lateral glide to R humeral head to improve glenohumeral mechanics and improve ability to raise her arm up more comfortably. Fair tolerance to today's session. Pt will benefit from continued skilled physical therapy services to decrease pain, improve strength, AROM, and function.        PATIENT EDUCATION: Education details: there-ex, HEP Person educated: Patient Education method: Explanation, Demonstration, Tactile cues, Verbal cues, and Handouts Education comprehension: verbalized understanding and returned demonstration  HOME EXERCISE PROGRAM: Access Code: [email or web address] URL: [email or web address] Date: 07/30/2022 Prepared by: Loralyn Freshwater  Exercises - Seated Scapular Retraction  - 2 x daily - 7 x weekly - 3 sets - 10 reps - 5 seconds hold - Supine Cervical Retraction with Towel  - 2 x daily - 7 x weekly - 3 sets - 10 reps - 5 seconds hold - Standing Shoulder External Rotation with Resistance  - 1 x daily - 7 x weekly - 3 sets - 10 reps - Seated Isometric Shoulder Internal Rotation at Chair  - 1 x daily - 7 x weekly - 3 sets - 10 reps - 5 seconds hold  - First Rib Mobilization with Strap  - 3 x daily - 7 x weekly - 1 sets - 3-5 reps - 30 seconds hold  - Supine Elbow Flexion Extension AROM  - 1 x  daily - 7 x weekly - 3 sets - 10 reps - 5 second hold     PT Short Term Goals - 09/20/22 1000       PT SHORT TERM GOAL #1   Title Pt will be independent with her initial HEP to decrease pain, improve strength, and ability to reach up and behind her more comfortably.    Baseline Was not able to do her HEP due to her R hip hurting, then vacation (09/20/2022)    Time 3    Period Weeks    Status On-going    Target Date 10/12/22              PT Long Term Goals - 09/20/22 1001       PT LONG TERM GOAL #1   Title Pt will have a decrease in R shoulder pain to 3/10 or less at worst to promote ability to reach up, reach behind her, as well as lay on her R side to sleep more comfortably.    Baseline 6/10 R shoulder pain at worst for the past 3 months (07/25/2022); 6.5/10 at most for the past 7 days (09/04/2022); 8/10 at worst for the past 7 days (has not been doing her HEP, was on vacation) 09/20/22.    Time 8    Period Weeks    Status On-going    Target Date 11/15/22      PT LONG TERM GOAL #2   Title Pt will improve R shoulder FOTO score by at least 10 points as a demonstration  of improved function.    Baseline R shoulder FOTO 54 (07/25/2022)    Time 3    Period Weeks    Status Deferred    Target Date 11/16/22      PT LONG TERM GOAL #3   Title Pt will improve R shoulder IR and ER strength by at least 1/2 MMT grade to promote ability to reach more comfortably.    Baseline R shoulder IR 4/5, ER 4/5 (07/25/2022); 4/5 IR, 4/5 ER (09/20/2022)    Time 8    Period Weeks    Status On-going    Target Date 11/16/22      PT LONG TERM GOAL #4   Title Pt will improve R shoulder AROM to at least 150 degrees flexion and abduction to promote ability to reach more comfortably.    Baseline R shoulder AROM 117 degrees flexion, 128 degrees abduction (07/25/2022); 121 degrees flexion, 106 degrees abduction but can go to about 139 degrees AAROM (09/04/2022); 135 degrees flexion AROM, 107 degrees abduction AROM  (09/20/2022)    Time 8    Period Weeks    Status On-going    Target Date 11/16/22      PT LONG TERM GOAL #5   Title Pt will improve R shoulder functional IR to R thumb to T7 spinous process to promote ability to don and doff clothes as well s perform self care more comfortably.    Baseline R shoulder functional IR: R thumb to T10 spinous process (07/25/2022); R thumb to L2 spinous process (09/20/2022)    Time 8    Period Weeks    Status On-going    Target Date 09/20/22              Plan - 09/20/22 0956     Clinical Impression Statement Pt returns to PT after a 16 day hiatus to to back pain and being on vacation/cruise. Has not done her home exercises. Pt currently demonstrates slight increased R shoulder flexion AROM since initial evaluation. Maintained ER and IR muscle strength. Pt however demonstrates decreased R shoulder functional IR and increased worst pain level to 8/10 in which not performing her home exercise program may play a factor. Continued working on improving posterior and lateral glide to R humeral head to improve glenohumeral mechanics and improve ability to raise her arm up more comfortably. Fair tolerance to today's session. Pt will benefit from continued skilled physical therapy services to decrease pain, improve strength, AROM, and function.    Personal Factors and Comorbidities Comorbidity 3+;Fitness;Past/Current Experience;Time since onset of injury/illness/exacerbation    Comorbidities Anxiety, back pain, arthritis, CHF, depression, HTN, DM, fibromyalgia, neuropathy    Examination-Activity Limitations Bathing;Reach Overhead;Bed Mobility;Dressing;Hygiene/Grooming;Toileting;Lift;Sleep    Stability/Clinical Decision Making Evolving/Moderate complexity   Pt states pain is worsening overall since onset   Rehab Potential Fair    PT Frequency 2x / week    PT Duration 8 weeks    PT Treatment/Interventions Therapeutic activities;Functional mobility training;Therapeutic  exercise;Neuromuscular re-education;Patient/family education;Manual techniques;Dry needling;Joint Manipulations;Spinal Manipulations;Electrical Stimulation;Iontophoresis 4mg /ml Dexamethasone;Traction   traction and/or manipulations if appropriate   PT Next Visit Plan thoracic extension, scapular, anterior cervical, ER, IR strengthening, manual techniques, modalities PRN    PT Home Exercise Plan medbridge Access Code: [email or web address]    Consulted and Agree with Plan of Care Patient                Loralyn Freshwater PT, DPT     09/20/2022, 11:00 AM

## 2022-09-26 ENCOUNTER — Ambulatory Visit: Payer: HMO

## 2022-09-26 DIAGNOSIS — M542 Cervicalgia: Secondary | ICD-10-CM

## 2022-09-26 DIAGNOSIS — G8929 Other chronic pain: Secondary | ICD-10-CM

## 2022-09-26 DIAGNOSIS — M25611 Stiffness of right shoulder, not elsewhere classified: Secondary | ICD-10-CM

## 2022-09-26 DIAGNOSIS — M25511 Pain in right shoulder: Secondary | ICD-10-CM | POA: Diagnosis not present

## 2022-09-26 NOTE — Therapy (Signed)
OUTPATIENT PHYSICAL THERAPY TREATMENT NOTE And Progress Report (07/25/2022 - 09/26/2022)   Patient Name: Brandy [name] MRN: [id number] DOB:1965-12-03, 57 y.o., female Today's Date: 09/26/2022  PCP: Doreene Nest, NP  REFERRING PROVIDER: Hannah Beat, MD  END OF SESSION:  PT End of Session - 09/26/22 1525     Visit Number 10    Number of Visits 17    Date for PT Re-Evaluation 11/16/22    PT Start Time 1525   pt arrived lated   PT Stop Time 1610    PT Time Calculation (min) 45 min    Activity Tolerance Patient tolerated treatment well    Behavior During Therapy WFL for tasks assessed/performed                     Past Medical History:  Diagnosis Date   Anxiety    Arthritis    Back pain    Brain fog    CHF (congestive heart failure) (HCC)    pt not aware of this   Depression    Diabetes mellitus without complication (HCC)    Difficulty walking    Dysmetabolic syndrome X    Fatigue    Fibromyalgia    neuropathy   Hypertension    Hypertriglyceridemia    Joint pain    Leg weakness    Lower extremity edema    Muscle atrophy    Neuropathy    Obesity    Osteoarthritis    PONV (postoperative nausea and vomiting)    Poor memory    Post-menopausal bleeding    Prediabetes    Rosacea    Sleep apnea    not on cpap yet   Tetanus vaccine causing adverse effect in therapeutic use    Vitamin D deficiency    Past Surgical History:  Procedure Laterality Date   BONE TUMOR EXCISION  1975   Right leg   DILATATION & CURETTAGE/HYSTEROSCOPY WITH MYOSURE N/A 10/07/2019   Procedure: DILATATION AND CURETTAGE /HYSTEROSCOPY, Myosure, Polypectomy;  Surgeon: [place] Bing, MD;  Location: [facility] OR;  Service: Gynecology;  Laterality: N/A;   ENDOMETRIAL BIOPSY  07/14/2019       epidural steroid injection     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   Procedure: LAPAROSCOPIC ROUX-EN-Y GASTRIC BYPASS WITH UPPER ENDOSCOPY;  Surgeon: Sheliah Hatch, De Blanch, MD;  Location: [facility]  ORS;  Service: General;  Laterality: N/A;   Patient Active Problem List   Diagnosis Date Noted   GERD (gastroesophageal reflux disease) 07/03/2022   Preventative health care 07/03/2022   S/P gastric bypass 06/20/2021   Morbid (severe) obesity due to excess calories (HCC) 04/04/2021   Near syncope 10/04/2020   Severe obstructive sleep apnea-hypopnea syndrome 09/23/2019   Class 2 obesity due to excess calories with body mass index (BMI) of 38.0 to 38.9 in adult 09/23/2019   Excessive daytime sleepiness 08/07/2019   Acute diastolic congestive heart failure (HCC) 08/07/2019   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 07/07/2019   Post-menopausal bleeding 06/17/2019   Vaginal burning 06/17/2019   Urinary incontinence 06/17/2019   Lower extremity weakness 05/13/2019   Poor short term memory 05/13/2019   Left groin pain 03/18/2019   Rosacea 08/25/2018   Chronic venous insufficiency 08/07/2018   PAD (peripheral artery disease) (HCC) 08/07/2018   Chronic bilateral low back pain with left-sided sciatica 07/04/2018   Vitamin B12 deficiency 05/23/2018   Arthralgia of multiple sites 05/01/2018   Abnormal TSH 03/17/2018   GAD (generalized anxiety disorder) 02/24/2018  Vitamin D deficiency 02/24/2018   Bilateral hand pain 04/06/2014   Bilateral wrist pain 04/06/2014   Chronic fatigue 03/15/2014   Palpitations 09/20/2010   Essential hypertension, benign 09/14/2010   HYPERTRIGLYCERIDEMIA 10/04/2009   Lower extremity pain 10/04/2009   Type 2 diabetes mellitus with hyperglycemia (HCC) 08/12/2009   OBESITY, MORBID 08/12/2009   MDD (major depressive disorder) 08/12/2009    REFERRING DIAG: M75.41 (ICD-10-CM) - Impingement syndrome of right shoulder M75.81 (ICD-10-CM) - Rotator cuff tendonitis, right   THERAPY DIAG:  Chronic right shoulder pain  Cervicalgia  Stiffness of right shoulder, not elsewhere classified  Rationale for Evaluation and Treatment  Rehabilitation  PERTINENT HISTORY: M75.41 (ICD-10-CM) - Impingement syndrome of right shoulder M75.81 (ICD-10-CM) - Rotator cuff tendonitis, right. Pain would be at posterior R shoulder, upper trap area, R shoulder joint, and R arm. Got a cortisone shot on 07/11/2022 which helped. Pain began about 2 years ago, gradual onset. Pt is R hand dominant. Pain has overall worsened since onset, but the cortisone shot provided some relief. Pt also reports R lateral neck pain.   PRECAUTIONS: No known precautions   SUBJECTIVE:   SUBJECTIVE STATEMENT: R shoulder is so so. 1/10 currently at rest, 2.5/10 when raising her arm.      PAIN:  Are you having pain? See subjective   TODAY'S TREATMENT:                                                                                                                                         DATE: 09/26/2022  Blood pressure is controlled.  DM controlled.    No latex allergies.    Pressure to R upper trap feels better per pt.    TTP R distal scalene area       Therapeutic exercises  Standing manually resisted R shoulder IR and ER  Standing R shoulder AROM 1x each way  Standing functional R shoulder IR 1x  Reviewed progress/current status with PT towards goals   Standing wall push-ups 10x3  Standing wall-push up position   Alternating shoulder flexion 10x2 each UE    Comfortable range      R shoulder flexion AROM 130 degrees afterwards   Sitting with R shoulder supported close to end range flexion   PT manually resisted scapular retraction targeting the lower trap muscle 10x5 seconds for 3 sets       Improved exercise technique, movement at target joints, use of target muscles after mod verbal, visual, tactile cues.     Manual therapy Supine with R arm in flexion   Posterior lateral glide to R humeral head grade 3+  Seated STM R upper trap to decrease muscle tension   134 degrees R shoulder flexion AROM afterwards          Response to treatment Pt tolerated session well without aggravation of symptoms.     Clinical impression  Pt demonstrates  improved R shoulder functional IR and R shoulder IR strength and R shoulder abduction AROM since previous session. Improved R shoulder flexion AROM after treatment to promote closed chain shoulder stability as well as decreasing R upper trap muscle tension. Continued with manual therapy to promote posterior and inferior glide of humeral head to promote better glenohumeral mechanics. Pt tolerated session well without aggravation of symptoms.  Pt will benefit from continued skilled physical therapy services to decrease pain, improve strength, AROM, and function.        PATIENT EDUCATION: Education details: there-ex, HEP Person educated: Patient Education method: Explanation, Demonstration, Tactile cues, Verbal cues, and Handouts Education comprehension: verbalized understanding and returned demonstration  HOME EXERCISE PROGRAM: Access Code: [email or web address] URL: [email or web address] Date: 07/30/2022 Prepared by: Loralyn Freshwater  Exercises - Seated Scapular Retraction  - 2 x daily - 7 x weekly - 3 sets - 10 reps - 5 seconds hold - Supine Cervical Retraction with Towel  - 2 x daily - 7 x weekly - 3 sets - 10 reps - 5 seconds hold - Standing Shoulder External Rotation with Resistance  - 1 x daily - 7 x weekly - 3 sets - 10 reps - Seated Isometric Shoulder Internal Rotation at Chair  - 1 x daily - 7 x weekly - 3 sets - 10 reps - 5 seconds hold  - First Rib Mobilization with Strap  - 3 x daily - 7 x weekly - 1 sets - 3-5 reps - 30 seconds hold  - Supine Elbow Flexion Extension AROM  - 1 x daily - 7 x weekly - 3 sets - 10 reps - 5 second hold     PT Short Term Goals - 09/20/22 1000       PT SHORT TERM GOAL #1   Title Pt will be independent with her initial HEP to decrease pain, improve strength, and ability to reach up and behind her more  comfortably.    Baseline Was not able to do her HEP due to her R hip hurting, then vacation (09/20/2022)    Time 3    Period Weeks    Status On-going    Target Date 10/12/22              PT Long Term Goals - 09/26/22 1527       PT LONG TERM GOAL #1   Title Pt will have a decrease in R shoulder pain to 3/10 or less at worst to promote ability to reach up, reach behind her, as well as lay on her R side to sleep more comfortably.    Baseline 6/10 R shoulder pain at worst for the past 3 months (07/25/2022); 6.5/10 at most for the past 7 days (09/04/2022); 8/10 at worst for the past 7 days (has not been doing her HEP, was on vacation) 09/20/22; 6/10 at worst for the past 7 days (09/26/2022)    Time 8    Period Weeks    Status On-going    Target Date 11/15/22      PT LONG TERM GOAL #2   Title Pt will improve R shoulder FOTO score by at least 10 points as a demonstration of improved function.    Baseline R shoulder FOTO 54 (07/25/2022)    Time 3    Period Weeks    Status On-going    Target Date 11/16/22      PT LONG TERM GOAL #3   Title Pt will improve R shoulder IR and ER strength by  at least 1/2 MMT grade to promote ability to reach more comfortably.    Baseline R shoulder IR 4/5, ER 4/5 (07/25/2022); 4/5 IR, 4/5 ER (09/20/2022); 4+/5 IR, 4/5 ER (09/26/2022)    Time 8    Period Weeks    Status Partially Met    Target Date 11/16/22      PT LONG TERM GOAL #4   Title Pt will improve R shoulder AROM to at least 150 degrees flexion and abduction to promote ability to reach more comfortably.    Baseline R shoulder AROM 117 degrees flexion, 128 degrees abduction (07/25/2022); 121 degrees flexion, 106 degrees abduction but can go to about 139 degrees AAROM (09/04/2022); 135 degrees flexion AROM, 107 degrees abduction AROM (09/20/2022); 123 degrees flexion, 140 degrees abduction (09/26/2022)    Time 8    Period Weeks    Status Partially Met    Target Date 11/16/22      PT LONG TERM GOAL #5    Title Pt will improve R shoulder functional IR to R thumb to T7 spinous process to promote ability to don and doff clothes as well s perform self care more comfortably.    Baseline R shoulder functional IR: R thumb to T10 spinous process (07/25/2022); R thumb to L2 spinous process (09/20/2022); R thumb to T9 Spinous process (09/26/2022)    Time 8    Period Weeks    Status On-going    Target Date 11/16/22              Plan - 09/26/22 1524     Clinical Impression Statement Pt demonstrates improved R shoulder functional IR and R shoulder IR strength and R shoulder abduction AROM since previous session. Improved R shoulder flexion AROM after treatment to promote closed chain shoulder stability as well as decreasing R upper trap muscle tension. Continued with manual therapy to promote posterior and inferior glide of humeral head to promote better glenohumeral mechanics. Pt tolerated session well without aggravation of symptoms.  Pt will benefit from continued skilled physical therapy services to decrease pain, improve strength, AROM, and function.    Personal Factors and Comorbidities Comorbidity 3+;Fitness;Past/Current Experience;Time since onset of injury/illness/exacerbation    Comorbidities Anxiety, back pain, arthritis, CHF, depression, HTN, DM, fibromyalgia, neuropathy    Examination-Activity Limitations Bathing;Reach Overhead;Bed Mobility;Dressing;Hygiene/Grooming;Toileting;Lift;Sleep    Stability/Clinical Decision Making Evolving/Moderate complexity   Pt states pain is worsening overall since onset   Rehab Potential Fair    PT Frequency 2x / week    PT Duration 8 weeks    PT Treatment/Interventions Therapeutic activities;Functional mobility training;Therapeutic exercise;Neuromuscular re-education;Patient/family education;Manual techniques;Dry needling;Joint Manipulations;Spinal Manipulations;Electrical Stimulation;Iontophoresis 4mg /ml Dexamethasone;Traction   traction and/or manipulations if  appropriate   PT Next Visit Plan thoracic extension, scapular, anterior cervical, ER, IR strengthening, manual techniques, modalities PRN    PT Home Exercise Plan medbridge Access Code: [email or web address]    Consulted and Agree with Plan of Care Patient              Thank you for your referral.   Loralyn Freshwater PT, DPT     09/26/2022, 4:18 PM

## 2022-09-27 ENCOUNTER — Ambulatory Visit (INDEPENDENT_AMBULATORY_CARE_PROVIDER_SITE_OTHER): Payer: HMO | Admitting: Psychology

## 2022-09-27 DIAGNOSIS — F339 Major depressive disorder, recurrent, unspecified: Secondary | ICD-10-CM

## 2022-09-27 NOTE — Progress Notes (Signed)
[facility] Counselor/Therapist Progress Note  Patient ID: Brandy [name], MRN: [id number]   Date: 09/27/22  Time Spent: 12:05 pm - 12:58 pm : 53 Minutes  Treatment Type: Individual Therapy.  Reported Symptoms: depression and anxiety.  Mental Status Exam: Appearance:  Casual     Behavior: Appropriate  Motor: Normal  Speech/Language:  Clear and Coherent  Affect: Congruent  Mood: normal  Thought process: normal  Thought content:   WNL  Sensory/Perceptual disturbances:   WNL  Orientation: oriented to person, place, time/date, and situation  Attention: Good  Concentration: Good  Memory: WNL  Fund of knowledge:  Good  Insight:   Good  Judgment:  Good  Impulse Control: Good   Risk Assessment: Danger to Self:  No Self-injurious Behavior: No Danger to Others: No Duty to Warn:no Physical Aggression / Violence:No  Access to Firearms a concern: No  Gang Involvement:No   Subjective:   Brandy [name] participated from home, via phone due to technical difficulties connecting despite numerous attempts, and consented to treatment. Therapist participated from home office. We met online due to COVID pandemic. Brandy [name] reviewed the events of the past week. She noted being less tearful in relation to last week and noted this being an improvement. She noted frustration regarding her daughter's behavior, specifically skipping her father, Brandy [name]'s husband's 33 th birthday. She noted working on acceptance of her daughter's behavior. She noted a history of her daughter taking their help for granted and not expressing appreciation. She noted feeling sadness regarding their dynamics which have been long-standing. She noted her efforts to maintain a positive relationship with her grand-daughter. She noted progress in waking early and getting out of bed upon waking more consistently than before. She noted enjoying this change and noted the positives of having quiet time in the morning. We  worked on identifying activities to engage during the morning that aligns with her goals such as reading and volunteering. Therapist encouraged continued efforts in this area and work on creating a list of simple tasks to follow-up on in the morning.  Therapist provided supportive therapy.   Interventions:  CBT & psycho-education.   Diagnosis:  Episode of recurrent major depressive disorder, unspecified depression episode severity [name])  Psychiatric Treatment: Yes , [name].    Treatment Plan:  Client Abilities/Strengths [name] is intelligent, self-aware, and motivated for change.   Support System: Family and friends.    Client Treatment Preferences Outpatient therapy.   Client Statement of Needs [name] would like to focus on self-care, eating healthfully, engaging in consistent exercise, focusing on sense of self,  being more organized and goal oriented, process past events including childhood, reduce negative self-talk.   Treatment Level Weekly  Symptoms  Depression: loss of interest, feeling down, poor sleep, lethargy, fluctuating appetite, feeling bad about self, and trouble concentrating.    (Status: maintained) Anxiety:  feeling anxious, difficulty managing worry, worrying about different things, trouble relaxing, restlessness, and feeling afraid as if something awful might happen.   (Status: maintained)  Goals:   [name] experiences symptoms of depression and anxiety.    Target Date: 07/19/23 Frequency: Weekly  Progress: 0 Modality: individual    Therapist will provide referrals for additional resources as appropriate.  Therapist will provide psycho-education regarding [name]'s diagnosis and corresponding treatment approaches and interventions. Licensed Clinical Social Worker, [name], LCSW will support the patient's ability to achieve the goals identified. will employ CBT, BA, Problem-solving, Solution Focused, Mindfulness,  coping skills, & other evidenced-based  practices  will be used to promote progress towards healthy functioning to help manage decrease symptoms associated with her diagnosis.   Reduce overall level, frequency, and intensity of the feelings of depression & anxiety and evidenced by decreased overall symptoms  from 6 to 7 days/week to 0 to 1 days/week per client report for at least 3 consecutive months. Verbally express understanding of the relationship between feelings of depression, anxiety and their impact on thinking patterns and behaviors. Verbalize an understanding of the role that distorted thinking plays in creating fears, excessive worry, and ruminations.    Lawson [name] participated in the creation of the treatment plan)    Brandy Ovens, LCSW

## 2022-10-03 ENCOUNTER — Encounter: Payer: Self-pay | Admitting: Internal Medicine

## 2022-10-03 NOTE — Telephone Encounter (Signed)
Dr. Kasa, please advise. Thanks °

## 2022-10-04 ENCOUNTER — Ambulatory Visit (INDEPENDENT_AMBULATORY_CARE_PROVIDER_SITE_OTHER): Payer: HMO | Admitting: Psychology

## 2022-10-04 DIAGNOSIS — F339 Major depressive disorder, recurrent, unspecified: Secondary | ICD-10-CM

## 2022-10-04 NOTE — Progress Notes (Signed)
[facility] Counselor/Therapist Progress Note  Patient ID: FELISITA PANTAZIS, MRN: [id number]   Date: 10/04/22  Time Spent: 12:08 pm -12:59  pm : 52 Minutes  Treatment Type: Individual Therapy.  Reported Symptoms: depression and anxiety.  Mental Status Exam: Appearance:  Casual     Behavior: Appropriate  Motor: Normal  Speech/Language:  Clear and Coherent  Affect: Congruent  Mood: normal  Thought process: normal  Thought content:   WNL  Sensory/Perceptual disturbances:   WNL  Orientation: oriented to person, place, time/date, and situation  Attention: Good  Concentration: Good  Memory: WNL  Fund of knowledge:  Good  Insight:   Good  Judgment:  Good  Impulse Control: Good   Risk Assessment: Danger to Self:  No Self-injurious Behavior: No Danger to Others: No Duty to Warn:no Physical Aggression / Violence:No  Access to Firearms a concern: No  Gang Involvement:No   Subjective:   Allisson Zimny Burrill participated from home, via video due to technical difficulties connecting despite numerous attempts, and consented to treatment. Therapist participated from home office. We met online due to COVID pandemic. Joslynne reviewed the events of the past week. She noted making some headway in creating lists of tasks to complete and getting out of bed sooner. She noted feeling more accomplished. Therapist praised Jassmine for her effort in this area and encouraged continued effort in this area. We worked on processing her recent frustration with her husband. We explored feelings and ways to give feedback. She noted frustration regarding her eldest son's lack of follow through on commitments, at the home, and noted difficulty communicating this. We worked on processing this during the session. We worked on identifying boundaries and differentiating between supportive and enabling behavior. She noted frustration regarding her daughter's poor communication and general antagonistic behavior. We  explored this and and her attempts to set boundaries. We worked on identifying and setting boundaries, the importance of consistency, and being mindful of not enabling behavior. Therapist validated Cloey's feelings. Therapist provided supportive therapy.   Interventions:  interpersonal.   Diagnosis:  Episode of recurrent major depressive disorder, unspecified depression episode severity [facility])  Psychiatric Treatment: Yes , [name].    Treatment Plan:  Client Abilities/Strengths Shatari is intelligent, self-aware, and motivated for change.   Support System: Family and friends.    Client Treatment Preferences Outpatient therapy.   Client Statement of Needs Shereda would like to focus on self-care, eating healthfully, engaging in consistent exercise, focusing on sense of self,  being more organized and goal oriented, process past events including childhood, reduce negative self-talk.   Treatment Level Weekly  Symptoms  Depression: loss of interest, feeling down, poor sleep, lethargy, fluctuating appetite, feeling bad about self, and trouble concentrating.    (Status: maintained) Anxiety:  feeling anxious, difficulty managing worry, worrying about different things, trouble relaxing, restlessness, and feeling afraid as if something awful might happen.   (Status: maintained)  Goals:   Anahi experiences symptoms of depression and anxiety.    Target Date: 07/19/23 Frequency: Weekly  Progress: 0 Modality: individual    Therapist will provide referrals for additional resources as appropriate.  Therapist will provide psycho-education regarding Tequila's diagnosis and corresponding treatment approaches and interventions. Licensed Clinical Social Worker, [place], LCSW will support the patient's ability to achieve the goals identified. will employ CBT, BA, Problem-solving, Solution Focused, Mindfulness,  coping skills, & other evidenced-based practices will be used to promote progress  towards healthy functioning to help manage decrease symptoms associated with  her diagnosis.   Reduce overall level, frequency, and intensity of the feelings of depression & anxiety and evidenced by decreased overall symptoms  from 6 to 7 days/week to 0 to 1 days/week per client report for at least 3 consecutive months. Verbally express understanding of the relationship between feelings of depression, anxiety and their impact on thinking patterns and behaviors. Verbalize an understanding of the role that distorted thinking plays in creating fears, excessive worry, and ruminations.    [name] participated in the creation of the treatment plan)    [name], LCSW

## 2022-10-09 ENCOUNTER — Ambulatory Visit: Payer: HMO | Attending: Family Medicine

## 2022-10-09 DIAGNOSIS — M25511 Pain in right shoulder: Secondary | ICD-10-CM | POA: Diagnosis not present

## 2022-10-09 DIAGNOSIS — G8929 Other chronic pain: Secondary | ICD-10-CM | POA: Insufficient documentation

## 2022-10-09 DIAGNOSIS — M542 Cervicalgia: Secondary | ICD-10-CM | POA: Insufficient documentation

## 2022-10-09 DIAGNOSIS — M25611 Stiffness of right shoulder, not elsewhere classified: Secondary | ICD-10-CM | POA: Diagnosis not present

## 2022-10-09 NOTE — Therapy (Signed)
OUTPATIENT PHYSICAL THERAPY TREATMENT NOTE   Patient Name: Brandy [name] MRN: [id number] DOB:January 31, 1966, 57 y.o., female Today's Date: 10/09/2022  PCP: Doreene Nest, NP  REFERRING PROVIDER: Hannah Beat, MD  END OF SESSION:  PT End of Session - 10/09/22 1120     Visit Number 11    Number of Visits 17    Date for PT Re-Evaluation 11/16/22    PT Start Time 1121    PT Stop Time 1205    PT Time Calculation (min) 44 min    Activity Tolerance Patient tolerated treatment well    Behavior During Therapy WFL for tasks assessed/performed                      Past Medical History:  Diagnosis Date   Anxiety    Arthritis    Back pain    Brain fog    CHF (congestive heart failure) (HCC)    pt not aware of this   Depression    Diabetes mellitus without complication (HCC)    Difficulty walking    Dysmetabolic syndrome X    Fatigue    Fibromyalgia    neuropathy   Hypertension    Hypertriglyceridemia    Joint pain    Leg weakness    Lower extremity edema    Muscle atrophy    Neuropathy    Obesity    Osteoarthritis    PONV (postoperative nausea and vomiting)    Poor memory    Post-menopausal bleeding    Prediabetes    Rosacea    Sleep apnea    not on cpap yet   Tetanus vaccine causing adverse effect in therapeutic use    Vitamin D deficiency    Past Surgical History:  Procedure Laterality Date   BONE TUMOR EXCISION  1975   Right leg   DILATATION & CURETTAGE/HYSTEROSCOPY WITH MYOSURE N/A 10/07/2019   Procedure: DILATATION AND CURETTAGE /HYSTEROSCOPY, Myosure, Polypectomy;  Surgeon: Addington Bing, MD;  Location: [facility];  Service: Gynecology;  Laterality: N/A;   ENDOMETRIAL BIOPSY  07/14/2019       epidural steroid injection     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   Procedure: LAPAROSCOPIC ROUX-EN-Y GASTRIC BYPASS WITH UPPER ENDOSCOPY;  Surgeon: Sheliah Hatch, De Blanch, MD;  Location: [facility];  Service: General;  Laterality: N/A;   Patient Active  Problem List   Diagnosis Date Noted   GERD (gastroesophageal reflux disease) 07/03/2022   Preventative health care 07/03/2022   S/P gastric bypass 06/20/2021   Morbid (severe) obesity due to excess calories (HCC) 04/04/2021   Near syncope 10/04/2020   Severe obstructive sleep apnea-hypopnea syndrome 09/23/2019   Class 2 obesity due to excess calories with body mass index (BMI) of 38.0 to 38.9 in adult 09/23/2019   Excessive daytime sleepiness 08/07/2019   Acute diastolic congestive heart failure (HCC) 08/07/2019   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 07/07/2019   Post-menopausal bleeding 06/17/2019   Vaginal burning 06/17/2019   Urinary incontinence 06/17/2019   Lower extremity weakness 05/13/2019   Poor short term memory 05/13/2019   Left groin pain 03/18/2019   Rosacea 08/25/2018   Chronic venous insufficiency 08/07/2018   PAD (peripheral artery disease) (HCC) 08/07/2018   Chronic bilateral low back pain with left-sided sciatica 07/04/2018   Vitamin B12 deficiency 05/23/2018   Arthralgia of multiple sites 05/01/2018   Abnormal TSH 03/17/2018   GAD (generalized anxiety disorder) 02/24/2018   Vitamin D deficiency 02/24/2018   Bilateral  hand pain 04/06/2014   Bilateral wrist pain 04/06/2014   Chronic fatigue 03/15/2014   Palpitations 09/20/2010   Essential hypertension, benign 09/14/2010   HYPERTRIGLYCERIDEMIA 10/04/2009   Lower extremity pain 10/04/2009   Type 2 diabetes mellitus with hyperglycemia (HCC) 08/12/2009   OBESITY, MORBID 08/12/2009   MDD (major depressive disorder) 08/12/2009    REFERRING DIAG: M75.41 (ICD-10-CM) - Impingement syndrome of right shoulder M75.81 (ICD-10-CM) - Rotator cuff tendonitis, right   THERAPY DIAG:  Chronic right shoulder pain  Cervicalgia  Stiffness of right shoulder, not elsewhere classified  Rationale for Evaluation and Treatment Rehabilitation  PERTINENT HISTORY: M75.41 (ICD-10-CM) - Impingement syndrome of  right shoulder M75.81 (ICD-10-CM) - Rotator cuff tendonitis, right. Pain would be at posterior R shoulder, upper trap area, R shoulder joint, and R arm. Got a cortisone shot on 07/11/2022 which helped. Pain began about 2 years ago, gradual onset. Pt is R hand dominant. Pain has overall worsened since onset, but the cortisone shot provided some relief. Pt also reports R lateral neck pain.   PRECAUTIONS: No known precautions   SUBJECTIVE:   SUBJECTIVE STATEMENT: R shoulder is on and off. Does not feel too bad right now. Hurt this morning, was sleeping weird on it. Does not know how to control the sleeping position. Needs to be out by 11:59 am next couple of Thursdays.      PAIN:  Are you having pain? See subjective   TODAY'S TREATMENT:                                                                                                                                         DATE: 10/09/2022  Blood pressure is controlled.  DM controlled.    No latex allergies.    Pressure to R upper trap feels better per pt.    TTP R distal scalene area       Therapeutic exercises  Pt was recommended to sleep on L side and place pillows behind her to keep her from turning.   Standing R shoulder AROM   Flexion 124 degrees   Standing wall push-ups 10x3  Improved R shoulder flexion to 135 degrees afterwards  Seated self inferior shoulder capsule stretch  R 10x3 with 5 second holds  Seated R shoulder ER yellow band 10x3  Standing wall-push up position   Alternating shoulder flexion 10x2 each UE    Comfortable range     Improved exercise technique, movement at target joints, use of target muscles after mod verbal, visual, tactile cues.    Manual therapy Supine with R arm in flexion   Posterior lateral glide to R humeral head grade 3+  Supine with R arm in abduction   Posterior inferior glide to R humeral head grade 3+  R shoulder AROM improved to 140 degrees  afterwards         Response to treatment Fair tolerance to today's  session.     Clinical impression    Improved R shoulder flexion AROM after treatment to promote closed chain shoulder stability as well as promoting posterior, inferior and lateral humeral head movement. Fair tolerance to today's session. Pt will benefit from continued skilled physical therapy services to decrease pain, improve strength, AROM, and function.        PATIENT EDUCATION: Education details: there-ex, HEP Person educated: Patient Education method: Explanation, Demonstration, Tactile cues, Verbal cues, and Handouts Education comprehension: verbalized understanding and returned demonstration  HOME EXERCISE PROGRAM: Access Code: [email or web address] URL: [email or web address] Date: 07/30/2022 Prepared by: Loralyn Freshwater  Exercises - Seated Scapular Retraction  - 2 x daily - 7 x weekly - 3 sets - 10 reps - 5 seconds hold - Supine Cervical Retraction with Towel  - 2 x daily - 7 x weekly - 3 sets - 10 reps - 5 seconds hold - Standing Shoulder External Rotation with Resistance  - 1 x daily - 7 x weekly - 3 sets - 10 reps - Seated Isometric Shoulder Internal Rotation at Chair  - 1 x daily - 7 x weekly - 3 sets - 10 reps - 5 seconds hold  - First Rib Mobilization with Strap  - 3 x daily - 7 x weekly - 1 sets - 3-5 reps - 30 seconds hold  - Supine Elbow Flexion Extension AROM  - 1 x daily - 7 x weekly - 3 sets - 10 reps - 5 second hold  - Tricep Push Up on Wall  - 1 x daily - 7 x weekly - 3 sets - 10 reps   - Seated Shoulder Inferior Glide  - 1 x daily - 7 x weekly - 3 sets - 10 reps - 5 seconds hold       PT Short Term Goals - 09/20/22 1000       PT SHORT TERM GOAL #1   Title Pt will be independent with her initial HEP to decrease pain, improve strength, and ability to reach up and behind her more comfortably.    Baseline Was not able to do her HEP due to her R hip hurting, then vacation  (09/20/2022)    Time 3    Period Weeks    Status On-going    Target Date 10/12/22              PT Long Term Goals - 09/26/22 1527       PT LONG TERM GOAL #1   Title Pt will have a decrease in R shoulder pain to 3/10 or less at worst to promote ability to reach up, reach behind her, as well as lay on her R side to sleep more comfortably.    Baseline 6/10 R shoulder pain at worst for the past 3 months (07/25/2022); 6.5/10 at most for the past 7 days (09/04/2022); 8/10 at worst for the past 7 days (has not been doing her HEP, was on vacation) 09/20/22; 6/10 at worst for the past 7 days (09/26/2022)    Time 8    Period Weeks    Status On-going    Target Date 11/15/22      PT LONG TERM GOAL #2   Title Pt will improve R shoulder FOTO score by at least 10 points as a demonstration of improved function.    Baseline R shoulder FOTO 54 (07/25/2022)    Time 3    Period Weeks    Status On-going    Target Date 11/16/22  PT LONG TERM GOAL #3   Title Pt will improve R shoulder IR and ER strength by at least 1/2 MMT grade to promote ability to reach more comfortably.    Baseline R shoulder IR 4/5, ER 4/5 (07/25/2022); 4/5 IR, 4/5 ER (09/20/2022); 4+/5 IR, 4/5 ER (09/26/2022)    Time 8    Period Weeks    Status Partially Met    Target Date 11/16/22      PT LONG TERM GOAL #4   Title Pt will improve R shoulder AROM to at least 150 degrees flexion and abduction to promote ability to reach more comfortably.    Baseline R shoulder AROM 117 degrees flexion, 128 degrees abduction (07/25/2022); 121 degrees flexion, 106 degrees abduction but can go to about 139 degrees AAROM (09/04/2022); 135 degrees flexion AROM, 107 degrees abduction AROM (09/20/2022); 123 degrees flexion, 140 degrees abduction (09/26/2022)    Time 8    Period Weeks    Status Partially Met    Target Date 11/16/22      PT LONG TERM GOAL #5   Title Pt will improve R shoulder functional IR to R thumb to T7 spinous process to promote  ability to don and doff clothes as well s perform self care more comfortably.    Baseline R shoulder functional IR: R thumb to T10 spinous process (07/25/2022); R thumb to L2 spinous process (09/20/2022); R thumb to T9 Spinous process (09/26/2022)    Time 8    Period Weeks    Status On-going    Target Date 11/16/22              Plan - 10/09/22 1215     Clinical Impression Statement Improved R shoulder flexion AROM after treatment to promote closed chain shoulder stability as well as promoting posterior, inferior and lateral humeral head movement. Fair tolerance to today's session. Pt will benefit from continued skilled physical therapy services to decrease pain, improve strength, AROM, and function.    Personal Factors and Comorbidities Comorbidity 3+;Fitness;Past/Current Experience;Time since onset of injury/illness/exacerbation    Comorbidities Anxiety, back pain, arthritis, CHF, depression, HTN, DM, fibromyalgia, neuropathy    Examination-Activity Limitations Bathing;Reach Overhead;Bed Mobility;Dressing;Hygiene/Grooming;Toileting;Lift;Sleep    Stability/Clinical Decision Making Stable/Uncomplicated   Pt states pain is worsening overall since onset   Rehab Potential Fair    PT Frequency 2x / week    PT Duration 8 weeks    PT Treatment/Interventions Therapeutic activities;Functional mobility training;Therapeutic exercise;Neuromuscular re-education;Patient/family education;Manual techniques;Dry needling;Joint Manipulations;Spinal Manipulations;Electrical Stimulation;Iontophoresis 4mg /ml Dexamethasone;Traction   traction and/or manipulations if appropriate   PT Next Visit Plan thoracic extension, scapular, anterior cervical, ER, IR strengthening, manual techniques, modalities PRN    PT Home Exercise Plan medbridge Access Code: [email or web address]    Consulted and Agree with Plan of Care Patient             Loralyn Freshwater PT, DPT     10/09/2022, 12:16 PM

## 2022-10-11 ENCOUNTER — Ambulatory Visit (INDEPENDENT_AMBULATORY_CARE_PROVIDER_SITE_OTHER): Payer: HMO | Admitting: Psychology

## 2022-10-11 ENCOUNTER — Ambulatory Visit: Payer: HMO

## 2022-10-11 DIAGNOSIS — M542 Cervicalgia: Secondary | ICD-10-CM

## 2022-10-11 DIAGNOSIS — M25511 Pain in right shoulder: Secondary | ICD-10-CM | POA: Diagnosis not present

## 2022-10-11 DIAGNOSIS — M25611 Stiffness of right shoulder, not elsewhere classified: Secondary | ICD-10-CM

## 2022-10-11 DIAGNOSIS — F339 Major depressive disorder, recurrent, unspecified: Secondary | ICD-10-CM | POA: Diagnosis not present

## 2022-10-11 DIAGNOSIS — G8929 Other chronic pain: Secondary | ICD-10-CM

## 2022-10-11 NOTE — Therapy (Signed)
OUTPATIENT PHYSICAL THERAPY TREATMENT NOTE   Patient Name: Brandy [name] MRN: [id number] DOB:05-08-1965, 57 y.o., female Today's Date: 10/11/2022  PCP: Doreene Nest, NP  REFERRING PROVIDER: Hannah Beat, MD  END OF SESSION:  PT End of Session - 10/11/22 1122     Visit Number 12    Number of Visits 17    Date for PT Re-Evaluation 11/16/22    PT Start Time 1122    PT Stop Time 1205    PT Time Calculation (min) 43 min    Activity Tolerance Patient tolerated treatment well    Behavior During Therapy WFL for tasks assessed/performed                       Past Medical History:  Diagnosis Date   Anxiety    Arthritis    Back pain    Brain fog    CHF (congestive heart failure) (HCC)    pt not aware of this   Depression    Diabetes mellitus without complication (HCC)    Difficulty walking    Dysmetabolic syndrome X    Fatigue    Fibromyalgia    neuropathy   Hypertension    Hypertriglyceridemia    Joint pain    Leg weakness    Lower extremity edema    Muscle atrophy    Neuropathy    Obesity    Osteoarthritis    PONV (postoperative nausea and vomiting)    Poor memory    Post-menopausal bleeding    Prediabetes    Rosacea    Sleep apnea    not on cpap yet   Tetanus vaccine causing adverse effect in therapeutic use    Vitamin D deficiency    Past Surgical History:  Procedure Laterality Date   BONE TUMOR EXCISION  1975   Right leg   DILATATION & CURETTAGE/HYSTEROSCOPY WITH MYOSURE N/A 10/07/2019   Procedure: DILATATION AND CURETTAGE /HYSTEROSCOPY, Myosure, Polypectomy;  Surgeon: [place] Bing, MD;  Location: [facility];  Service: Gynecology;  Laterality: N/A;   ENDOMETRIAL BIOPSY  07/14/2019       epidural steroid injection     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   Procedure: LAPAROSCOPIC ROUX-EN-Y GASTRIC BYPASS WITH UPPER ENDOSCOPY;  Surgeon: Sheliah Hatch, De Blanch, MD;  Location: [facility];  Service: General;  Laterality: N/A;   Patient Active  Problem List   Diagnosis Date Noted   GERD (gastroesophageal reflux disease) 07/03/2022   Preventative health care 07/03/2022   S/P gastric bypass 06/20/2021   Morbid (severe) obesity due to excess calories (HCC) 04/04/2021   Near syncope 10/04/2020   Severe obstructive sleep apnea-hypopnea syndrome 09/23/2019   Class 2 obesity due to excess calories with body mass index (BMI) of 38.0 to 38.9 in adult 09/23/2019   Excessive daytime sleepiness 08/07/2019   Acute diastolic congestive heart failure (HCC) 08/07/2019   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 07/07/2019   Post-menopausal bleeding 06/17/2019   Vaginal burning 06/17/2019   Urinary incontinence 06/17/2019   Lower extremity weakness 05/13/2019   Poor short term memory 05/13/2019   Left groin pain 03/18/2019   Rosacea 08/25/2018   Chronic venous insufficiency 08/07/2018   PAD (peripheral artery disease) (HCC) 08/07/2018   Chronic bilateral low back pain with left-sided sciatica 07/04/2018   Vitamin B12 deficiency 05/23/2018   Arthralgia of multiple sites 05/01/2018   Abnormal TSH 03/17/2018   GAD (generalized anxiety disorder) 02/24/2018   Vitamin D deficiency 02/24/2018  Bilateral hand pain 04/06/2014   Bilateral wrist pain 04/06/2014   Chronic fatigue 03/15/2014   Palpitations 09/20/2010   Essential hypertension, benign 09/14/2010   HYPERTRIGLYCERIDEMIA 10/04/2009   Lower extremity pain 10/04/2009   Type 2 diabetes mellitus with hyperglycemia (HCC) 08/12/2009   OBESITY, MORBID 08/12/2009   MDD (major depressive disorder) 08/12/2009    REFERRING DIAG: M75.41 (ICD-10-CM) - Impingement syndrome of right shoulder M75.81 (ICD-10-CM) - Rotator cuff tendonitis, right   THERAPY DIAG:  Chronic right shoulder pain  Cervicalgia  Stiffness of right shoulder, not elsewhere classified  Rationale for Evaluation and Treatment Rehabilitation  PERTINENT HISTORY: M75.41 (ICD-10-CM) - Impingement syndrome of  right shoulder M75.81 (ICD-10-CM) - Rotator cuff tendonitis, right. Pain would be at posterior R shoulder, upper trap area, R shoulder joint, and R arm. Got a cortisone shot on 07/11/2022 which helped. Pain began about 2 years ago, gradual onset. Pt is R hand dominant. Pain has overall worsened since onset, but the cortisone shot provided some relief. Pt also reports R lateral neck pain.   PRECAUTIONS: No known precautions   SUBJECTIVE:   SUBJECTIVE STATEMENT: R shoulder sucks. 6-7/10 currently, R upper trap area. R shoulder pain increased yesterday. Over did it with her R shoulder (doing stuff around the house and doing her HEP). Does not know what happened. Has to leave by 12 pm     PAIN:  Are you having pain? See subjective   TODAY'S TREATMENT:                                                                                                                                         DATE: 10/11/2022  Blood pressure is controlled.  DM controlled.    No latex allergies.    Pressure to R upper trap feels better per pt.    TTP R distal scalene area       Therapeutic exercises  Pt was recommended to sleep on L side and place pillows behind her to keep her from turning.   Standing R shoulder AROM at start of session  Flexion 124 degrees    Seated B scapular retraction green band 10x3 seconds with 5 second holds  Held off wall push up HEP (10/11/2022)  Seated R levator stretch 30 seconds x 3  Standing R shoulder extension with scapular retraction green band 10x  Cues for posterior tipping of scapula   Seated chin tucks 10x5 seconds for 3 sets  Seated scapular depression isometrics, forearm on arm rest to reciprocally inhibit R upper trap and levator scapula muscles 10x3 with 5 second holds   R first rib stretch with strap 30 seconds x 3 with L cervical side bend  Then with R and L cervical rotation 10x2 each direction.    Feels good per pt.      Decreased pain to 5.5/10  currently      Improved exercise technique,  movement at target joints, use of target muscles after mod verbal, visual, tactile cues.     Response to treatment Pt tolerated session well without aggravation of symptoms.     Clinical impression  Worked on improving scapular strength and decreasing R upper trap, levator, and scalene muscle tension to her neck. Decreased pain reported after session. Pt will benefit from continued skilled physical therapy services to decrease pain, improve strength, AROM, and function.        PATIENT EDUCATION: Education details: there-ex, HEP Person educated: Patient Education method: Explanation, Demonstration, Tactile cues, Verbal cues, and Handouts Education comprehension: verbalized understanding and returned demonstration  HOME EXERCISE PROGRAM: Access Code: [email or web address] URL: [email or web address] Date: 07/30/2022 Prepared by: Loralyn Freshwater  Exercises - Seated Scapular Retraction  - 2 x daily - 7 x weekly - 3 sets - 10 reps - 5 seconds hold - Supine Cervical Retraction with Towel  - 2 x daily - 7 x weekly - 3 sets - 10 reps - 5 seconds hold - Standing Shoulder External Rotation with Resistance  - 1 x daily - 7 x weekly - 3 sets - 10 reps - Seated Isometric Shoulder Internal Rotation at Chair  - 1 x daily - 7 x weekly - 3 sets - 10 reps - 5 seconds hold  - First Rib Mobilization with Strap  - 3 x daily - 7 x weekly - 1 sets - 3-5 reps - 30 seconds hold  - Supine Elbow Flexion Extension AROM  - 1 x daily - 7 x weekly - 3 sets - 10 reps - 5 second hold  - Tricep Push Up on Wall  - 1 x daily - 7 x weekly - 3 sets - 10 reps  Held off on 10/11/2022  - Seated Shoulder Inferior Glide  - 1 x daily - 7 x weekly - 3 sets - 10 reps - 5 seconds hold  R first rib stretch with strap with R and L cervical rotation 10x3 each direction.       PT Short Term Goals - 09/20/22 1000       PT SHORT TERM GOAL #1   Title Pt will be independent  with her initial HEP to decrease pain, improve strength, and ability to reach up and behind her more comfortably.    Baseline Was not able to do her HEP due to her R hip hurting, then vacation (09/20/2022)    Time 3    Period Weeks    Status On-going    Target Date 10/12/22              PT Long Term Goals - 09/26/22 1527       PT LONG TERM GOAL #1   Title Pt will have a decrease in R shoulder pain to 3/10 or less at worst to promote ability to reach up, reach behind her, as well as lay on her R side to sleep more comfortably.    Baseline 6/10 R shoulder pain at worst for the past 3 months (07/25/2022); 6.5/10 at most for the past 7 days (09/04/2022); 8/10 at worst for the past 7 days (has not been doing her HEP, was on vacation) 09/20/22; 6/10 at worst for the past 7 days (09/26/2022)    Time 8    Period Weeks    Status On-going    Target Date 11/15/22      PT LONG TERM GOAL #2   Title Pt will improve R shoulder FOTO score by at  least 10 points as a demonstration of improved function.    Baseline R shoulder FOTO 54 (07/25/2022)    Time 3    Period Weeks    Status On-going    Target Date 11/16/22      PT LONG TERM GOAL #3   Title Pt will improve R shoulder IR and ER strength by at least 1/2 MMT grade to promote ability to reach more comfortably.    Baseline R shoulder IR 4/5, ER 4/5 (07/25/2022); 4/5 IR, 4/5 ER (09/20/2022); 4+/5 IR, 4/5 ER (09/26/2022)    Time 8    Period Weeks    Status Partially Met    Target Date 11/16/22      PT LONG TERM GOAL #4   Title Pt will improve R shoulder AROM to at least 150 degrees flexion and abduction to promote ability to reach more comfortably.    Baseline R shoulder AROM 117 degrees flexion, 128 degrees abduction (07/25/2022); 121 degrees flexion, 106 degrees abduction but can go to about 139 degrees AAROM (09/04/2022); 135 degrees flexion AROM, 107 degrees abduction AROM (09/20/2022); 123 degrees flexion, 140 degrees abduction (09/26/2022)    Time 8     Period Weeks    Status Partially Met    Target Date 11/16/22      PT LONG TERM GOAL #5   Title Pt will improve R shoulder functional IR to R thumb to T7 spinous process to promote ability to don and doff clothes as well s perform self care more comfortably.    Baseline R shoulder functional IR: R thumb to T10 spinous process (07/25/2022); R thumb to L2 spinous process (09/20/2022); R thumb to T9 Spinous process (09/26/2022)    Time 8    Period Weeks    Status On-going    Target Date 11/16/22              Plan - 10/11/22 1121     Clinical Impression Statement Worked on improving scapular strength and decreasing R upper trap, levator, and scalene muscle tension to her neck. Decreased pain reported after session. Pt will benefit from continued skilled physical therapy services to decrease pain, improve strength, AROM, and function.    Personal Factors and Comorbidities Comorbidity 3+;Fitness;Past/Current Experience;Time since onset of injury/illness/exacerbation    Comorbidities Anxiety, back pain, arthritis, CHF, depression, HTN, DM, fibromyalgia, neuropathy    Examination-Activity Limitations Bathing;Reach Overhead;Bed Mobility;Dressing;Hygiene/Grooming;Toileting;Lift;Sleep    Stability/Clinical Decision Making Stable/Uncomplicated   Pt states pain is worsening overall since onset   Rehab Potential Fair    PT Frequency 2x / week    PT Duration 8 weeks    PT Treatment/Interventions Therapeutic activities;Functional mobility training;Therapeutic exercise;Neuromuscular re-education;Patient/family education;Manual techniques;Dry needling;Joint Manipulations;Spinal Manipulations;Electrical Stimulation;Iontophoresis 4mg /ml Dexamethasone;Traction   traction and/or manipulations if appropriate   PT Next Visit Plan thoracic extension, scapular, anterior cervical, ER, IR strengthening, manual techniques, modalities PRN    PT Home Exercise Plan medbridge Access Code: [email or web address]    Consulted and  Agree with Plan of Care Patient             Loralyn Freshwater PT, DPT     10/11/2022, 12:18 PM

## 2022-10-11 NOTE — Progress Notes (Signed)
[name] Counselor/Therapist Progress Note  Patient ID: DAVIDA FALCONI, MRN: [id number]   Date: 10/11/22  Time Spent: 12:07 pm - 1:00 pm : 53 Minutes  Treatment Type: Individual Therapy.  Reported Symptoms: depression and anxiety.  Mental Status Exam: Appearance:  Casual     Behavior: Appropriate  Motor: Normal  Speech/Language:  Clear and Coherent  Affect: Congruent  Mood: normal  Thought process: normal  Thought content:   WNL  Sensory/Perceptual disturbances:   WNL  Orientation: oriented to person, place, time/date, and situation  Attention: Good  Concentration: Good  Memory: WNL  Fund of knowledge:  Good  Insight:   Good  Judgment:  Good  Impulse Control: Good   Risk Assessment: Danger to Self:  No Self-injurious Behavior: No Danger to Others: No Duty to Warn:no Physical Aggression / Violence:No  Access to Firearms a concern: No  Gang Involvement:No   Subjective:   Vernelle Wisner Yniguez participated from car, via video, is aware of limitations, and consented to treatment. Therapist participated from home office. Trenee reviewed the events of the past week. She noted feeling unaccomplished and noted wanting to do as much as possible in a day. She noted feeling overwhelmed by the tasks she wants to complete on a daily basis. She noted her worry of overloading her schedule, as well. We worked on processing her drive to add additional tasks and identified rigid thinking. We worked on creating flexibility in this area and to re-frame her goals and set reasonable goals. We explored making minor changes that are sustainable. She noted her daughter suddenly discontinuing contact and her frequent antagonistic behavior and poor frustration tolerance. She noted this being disrespectful and noted feeling this behavior being undeserved. We worked on processing what contributes to how she sees and attributes her daughter's behavior to herself. We worked on identifying  boundaries for self, focusing on maintaining positive communication, and continuing to set boundaries. We processed her feelings of pain and hurt during the session. Therapist validated Dajanee's feelings. Therapist provided supportive therapy.   Interventions:  CBT & interpersonal.   Diagnosis:  Episode of recurrent major depressive disorder, unspecified depression episode severity [facility])  Psychiatric Treatment: Yes , [name], MD.     Treatment Plan:  Client Abilities/Strengths Alivia is intelligent, self-aware, and motivated for change.   Support System: Family and friends.    Client Treatment Preferences Outpatient therapy.   Client Statement of Needs [place] would like to focus on self-care, eating healthfully, engaging in consistent exercise, focusing on sense of self,  being more organized and goal oriented, process past events including childhood, reduce negative self-talk.   Treatment Level Weekly  Symptoms  Depression: loss of interest, feeling down, poor sleep, lethargy, fluctuating appetite, feeling bad about self, and trouble concentrating.    (Status: maintained) Anxiety:  feeling anxious, difficulty managing worry, worrying about different things, trouble relaxing, restlessness, and feeling afraid as if something awful might happen.   (Status: maintained)  Goals:   Dyneshia experiences symptoms of depression and anxiety.    Target Date: 07/19/23 Frequency: Weekly  Progress: 0 Modality: individual    Therapist will provide referrals for additional resources as appropriate.  Therapist will provide psycho-education regarding Amran's diagnosis and corresponding treatment approaches and interventions. Licensed Clinical Social Worker, [place], LCSW will support the patient's ability to achieve the goals identified. will employ CBT, BA, Problem-solving, Solution Focused, Mindfulness,  coping skills, & other evidenced-based practices will be used to promote progress  towards healthy  functioning to help manage decrease symptoms associated with her diagnosis.   Reduce overall level, frequency, and intensity of the feelings of depression & anxiety and evidenced by decreased overall symptoms  from 6 to 7 days/week to 0 to 1 days/week per client report for at least 3 consecutive months. Verbally express understanding of the relationship between feelings of depression, anxiety and their impact on thinking patterns and behaviors. Verbalize an understanding of the role that distorted thinking plays in creating fears, excessive worry, and ruminations.    [name] participated in the creation of the treatment plan)    [name], LCSW                  [name], LCSW

## 2022-10-16 ENCOUNTER — Ambulatory Visit: Payer: HMO

## 2022-10-16 DIAGNOSIS — M25511 Pain in right shoulder: Secondary | ICD-10-CM | POA: Diagnosis not present

## 2022-10-16 DIAGNOSIS — M542 Cervicalgia: Secondary | ICD-10-CM

## 2022-10-16 DIAGNOSIS — M25611 Stiffness of right shoulder, not elsewhere classified: Secondary | ICD-10-CM

## 2022-10-16 DIAGNOSIS — G8929 Other chronic pain: Secondary | ICD-10-CM

## 2022-10-16 NOTE — Therapy (Signed)
OUTPATIENT PHYSICAL THERAPY TREATMENT NOTE   Patient Name: Brandy [name] MRN: [id number] DOB:[birth year], 57 y.o., female Today's Date: 10/16/2022  PCP: Doreene Nest, NP  REFERRING PROVIDER: Hannah Beat, MD  END OF SESSION:  PT End of Session - 10/16/22 1125     Visit Number 13    Number of Visits 17    Date for PT Re-Evaluation 11/16/22    PT Start Time 1125    PT Stop Time 1203    PT Time Calculation (min) 38 min    Activity Tolerance Patient tolerated treatment well    Behavior During Therapy WFL for tasks assessed/performed                        Past Medical History:  Diagnosis Date   Anxiety    Arthritis    Back pain    Brain fog    CHF (congestive heart failure) (HCC)    pt not aware of this   Depression    Diabetes mellitus without complication (HCC)    Difficulty walking    Dysmetabolic syndrome X    Fatigue    Fibromyalgia    neuropathy   Hypertension    Hypertriglyceridemia    Joint pain    Leg weakness    Lower extremity edema    Muscle atrophy    Neuropathy    Obesity    Osteoarthritis    PONV (postoperative nausea and vomiting)    Poor memory    Post-menopausal bleeding    Prediabetes    Rosacea    Sleep apnea    not on cpap yet   Tetanus vaccine causing adverse effect in therapeutic use    Vitamin D deficiency    Past Surgical History:  Procedure Laterality Date   BONE TUMOR EXCISION  1975   Right leg   DILATATION & CURETTAGE/HYSTEROSCOPY WITH MYOSURE N/A 10/07/2019   Procedure: DILATATION AND CURETTAGE /HYSTEROSCOPY, Myosure, Polypectomy;  Surgeon: Lauderdale Bing, MD;  Location: [facility] OR;  Service: Gynecology;  Laterality: N/A;   ENDOMETRIAL BIOPSY  07/14/2019       epidural steroid injection     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   Procedure: LAPAROSCOPIC ROUX-EN-Y GASTRIC BYPASS WITH UPPER ENDOSCOPY;  Surgeon: Sheliah Hatch, De Blanch, MD;  Location: [facility] ORS;  Service: General;  Laterality: N/A;   Patient Active  Problem List   Diagnosis Date Noted   GERD (gastroesophageal reflux disease) 07/03/2022   Preventative health care 07/03/2022   S/P gastric bypass 06/20/2021   Morbid (severe) obesity due to excess calories (HCC) 04/04/2021   Near syncope 10/04/2020   Severe obstructive sleep apnea-hypopnea syndrome 09/23/2019   Class 2 obesity due to excess calories with body mass index (BMI) of 38.0 to 38.9 in adult 09/23/2019   Excessive daytime sleepiness 08/07/2019   Acute diastolic congestive heart failure (HCC) 08/07/2019   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 07/07/2019   Post-menopausal bleeding 06/17/2019   Vaginal burning 06/17/2019   Urinary incontinence 06/17/2019   Lower extremity weakness 05/13/2019   Poor short term memory 05/13/2019   Left groin pain 03/18/2019   Rosacea 08/25/2018   Chronic venous insufficiency 08/07/2018   PAD (peripheral artery disease) (HCC) 08/07/2018   Chronic bilateral low back pain with left-sided sciatica 07/04/2018   Vitamin B12 deficiency 05/23/2018   Arthralgia of multiple sites 05/01/2018   Abnormal TSH 03/17/2018   GAD (generalized anxiety disorder) 02/24/2018   Vitamin D deficiency 02/24/2018  Bilateral hand pain 04/06/2014   Bilateral wrist pain 04/06/2014   Chronic fatigue 03/15/2014   Palpitations 09/20/2010   Essential hypertension, benign 09/14/2010   HYPERTRIGLYCERIDEMIA 10/04/2009   Lower extremity pain 10/04/2009   Type 2 diabetes mellitus with hyperglycemia (HCC) 08/12/2009   OBESITY, MORBID 08/12/2009   MDD (major depressive disorder) 08/12/2009    REFERRING DIAG: M75.41 (ICD-10-CM) - Impingement syndrome of right shoulder M75.81 (ICD-10-CM) - Rotator cuff tendonitis, right   THERAPY DIAG:  Chronic right shoulder pain  Cervicalgia  Stiffness of right shoulder, not elsewhere classified  Rationale for Evaluation and Treatment Rehabilitation  PERTINENT HISTORY: M75.41 (ICD-10-CM) - Impingement syndrome of  right shoulder M75.81 (ICD-10-CM) - Rotator cuff tendonitis, right. Pain would be at posterior R shoulder, upper trap area, R shoulder joint, and R arm. Got a cortisone shot on 07/11/2022 which helped. Pain began about 2 years ago, gradual onset. Pt is R hand dominant. Pain has overall worsened since onset, but the cortisone shot provided some relief. Pt also reports R lateral neck pain.   PRECAUTIONS: No known precautions   SUBJECTIVE:   SUBJECTIVE STATEMENT: R posterior shoulder is doing ok, about a 6/10 currently. Did exercises this morning which felt good.  3/10 R upper trap area currently.      PAIN:  Are you having pain? See subjective   TODAY'S TREATMENT:                                                                                                                                         DATE: 10/16/2022  Blood pressure is controlled.  DM controlled.    No latex allergies.    Pressure to R upper trap feels better per pt.    TTP R distal scalene area       Therapeutic exercises  Standing door pectoralis stretch R 25 seconds. Uncomfortable for back  Supine R pectoralis stretch 30 seconds x 5  Supine pectoralis stretch with hands behind head 10 seconds. R shoulder discomfort  Supine B shoulder horizontal abduction 10x5 seconds for 2 sets  Supine D2 shoulder flexion with yellow band   R 2x   R posterior shoulder discomfort  Supine R shoulder extension from end range flexion  Red 10x3    Improved exercise technique, movement at target joints, use of target muscles after mod verbal, visual, tactile cues.   Manual therapy   Supine STM R teres major muscle to decrease tension with R shoulder in flexion   142 degrees R shoulder flexion after session.     Response to treatment Pt tolerated session well without aggravation of symptoms. Decreased R shoulder pain to 4/10 after session.     Clinical impression  Worked on decreasing pectoralis muscle tension to  promote scapular retraction and posterior tipping to decrease shoulder impingement when raising her arm up. Also worked on [employer] to decrease R teres major muscle to  decrease tension and improve R shoulder flexion AROM. Pt able to achieve 142 degrees R shoulder flexion AROM after session. Pt will benefit from continued skilled physical therapy services to decrease pain, improve strength, AROM, and function.        PATIENT EDUCATION: Education details: there-ex, HEP Person educated: Patient Education method: Explanation, Demonstration, Tactile cues, Verbal cues, and Handouts Education comprehension: verbalized understanding and returned demonstration  HOME EXERCISE PROGRAM: Access Code: [email or web address] URL: [email or web address] Date: 07/30/2022 Prepared by: Loralyn Freshwater  Exercises - Seated Scapular Retraction  - 2 x daily - 7 x weekly - 3 sets - 10 reps - 5 seconds hold - Supine Cervical Retraction with Towel  - 2 x daily - 7 x weekly - 3 sets - 10 reps - 5 seconds hold - Standing Shoulder External Rotation with Resistance  - 1 x daily - 7 x weekly - 3 sets - 10 reps - Seated Isometric Shoulder Internal Rotation at Chair  - 1 x daily - 7 x weekly - 3 sets - 10 reps - 5 seconds hold  - First Rib Mobilization with Strap  - 3 x daily - 7 x weekly - 1 sets - 3-5 reps - 30 seconds hold  - Supine Elbow Flexion Extension AROM  - 1 x daily - 7 x weekly - 3 sets - 10 reps - 5 second hold  - Tricep Push Up on Wall  - 1 x daily - 7 x weekly - 3 sets - 10 reps  Held off on 10/11/2022  - Seated Shoulder Inferior Glide  - 1 x daily - 7 x weekly - 3 sets - 10 reps - 5 seconds hold  R first rib stretch with strap with R and L cervical rotation 10x3 each direction.   - Supine Shoulder Horizontal Abduction with Dumbbells  - 1 x daily - 7 x weekly - 3 sets - 10 reps - 5 seconds  hold    PT Short Term Goals - 09/20/22 1000       PT SHORT TERM GOAL #1   Title Pt will be independent with her  initial HEP to decrease pain, improve strength, and ability to reach up and behind her more comfortably.    Baseline Was not able to do her HEP due to her R hip hurting, then vacation (09/20/2022)    Time 3    Period Weeks    Status On-going    Target Date 10/12/22              PT Long Term Goals - 09/26/22 1527       PT LONG TERM GOAL #1   Title Pt will have a decrease in R shoulder pain to 3/10 or less at worst to promote ability to reach up, reach behind her, as well as lay on her R side to sleep more comfortably.    Baseline 6/10 R shoulder pain at worst for the past 3 months (07/25/2022); 6.5/10 at most for the past 7 days (09/04/2022); 8/10 at worst for the past 7 days (has not been doing her HEP, was on vacation) 09/20/22; 6/10 at worst for the past 7 days (09/26/2022)    Time 8    Period Weeks    Status On-going    Target Date 11/15/22      PT LONG TERM GOAL #2   Title Pt will improve R shoulder FOTO score by at least 10 points as a demonstration of improved function.    Baseline  R shoulder FOTO 54 (07/25/2022)    Time 3    Period Weeks    Status On-going    Target Date 11/16/22      PT LONG TERM GOAL #3   Title Pt will improve R shoulder IR and ER strength by at least 1/2 MMT grade to promote ability to reach more comfortably.    Baseline R shoulder IR 4/5, ER 4/5 (07/25/2022); 4/5 IR, 4/5 ER (09/20/2022); 4+/5 IR, 4/5 ER (09/26/2022)    Time 8    Period Weeks    Status Partially Met    Target Date 11/16/22      PT LONG TERM GOAL #4   Title Pt will improve R shoulder AROM to at least 150 degrees flexion and abduction to promote ability to reach more comfortably.    Baseline R shoulder AROM 117 degrees flexion, 128 degrees abduction (07/25/2022); 121 degrees flexion, 106 degrees abduction but can go to about 139 degrees AAROM (09/04/2022); 135 degrees flexion AROM, 107 degrees abduction AROM (09/20/2022); 123 degrees flexion, 140 degrees abduction (09/26/2022)    Time 8    Period  Weeks    Status Partially Met    Target Date 11/16/22      PT LONG TERM GOAL #5   Title Pt will improve R shoulder functional IR to R thumb to T7 spinous process to promote ability to don and doff clothes as well s perform self care more comfortably.    Baseline R shoulder functional IR: R thumb to T10 spinous process (07/25/2022); R thumb to L2 spinous process (09/20/2022); R thumb to T9 Spinous process (09/26/2022)    Time 8    Period Weeks    Status On-going    Target Date 11/16/22              Plan - 10/16/22 1124     Clinical Impression Statement Worked on decreasing pectoralis muscle tension to promote scapular retraction and posterior tipping to decrease shoulder impingement when raising her arm up. Also worked on [employer] to decrease R teres major muscle to decrease tension and improve R shoulder flexion AROM. Pt able to achieve 142 degrees R shoulder flexion AROM after session. Pt will benefit from continued skilled physical therapy services to decrease pain, improve strength, AROM, and function.    Personal Factors and Comorbidities Comorbidity 3+;Fitness;Past/Current Experience;Time since onset of injury/illness/exacerbation    Comorbidities Anxiety, back pain, arthritis, CHF, depression, HTN, DM, fibromyalgia, neuropathy    Examination-Activity Limitations Bathing;Reach Overhead;Bed Mobility;Dressing;Hygiene/Grooming;Toileting;Lift;Sleep    Stability/Clinical Decision Making Stable/Uncomplicated   Pt states pain is worsening overall since onset   Rehab Potential Fair    PT Frequency 2x / week    PT Duration 8 weeks    PT Treatment/Interventions Therapeutic activities;Functional mobility training;Therapeutic exercise;Neuromuscular re-education;Patient/family education;Manual techniques;Dry needling;Joint Manipulations;Spinal Manipulations;Electrical Stimulation;Iontophoresis 4mg /ml Dexamethasone;Traction   traction and/or manipulations if appropriate   PT Next Visit Plan thoracic  extension, scapular, anterior cervical, ER, IR strengthening, manual techniques, modalities PRN    PT Home Exercise Plan medbridge Access Code: [email or web address]    Consulted and Agree with Plan of Care Patient             Loralyn Freshwater PT, DPT     10/16/2022, 12:17 PM

## 2022-10-18 ENCOUNTER — Ambulatory Visit: Payer: HMO

## 2022-10-18 ENCOUNTER — Ambulatory Visit (INDEPENDENT_AMBULATORY_CARE_PROVIDER_SITE_OTHER): Payer: HMO | Admitting: Psychology

## 2022-10-18 DIAGNOSIS — F339 Major depressive disorder, recurrent, unspecified: Secondary | ICD-10-CM

## 2022-10-18 DIAGNOSIS — M25511 Pain in right shoulder: Secondary | ICD-10-CM | POA: Diagnosis not present

## 2022-10-18 DIAGNOSIS — M25611 Stiffness of right shoulder, not elsewhere classified: Secondary | ICD-10-CM

## 2022-10-18 DIAGNOSIS — G8929 Other chronic pain: Secondary | ICD-10-CM

## 2022-10-18 DIAGNOSIS — M542 Cervicalgia: Secondary | ICD-10-CM

## 2022-10-18 NOTE — Therapy (Signed)
OUTPATIENT PHYSICAL THERAPY TREATMENT NOTE   Patient Name: Brandy [name] MRN: [id number] DOB:1966/03/06, 57 y.o., female Today's Date: 10/18/2022  PCP: Doreene Nest, NP  REFERRING PROVIDER: Hannah Beat, MD  END OF SESSION:  PT End of Session - 10/18/22 1123     Visit Number 14    Number of Visits 17    Date for PT Re-Evaluation 11/16/22    PT Start Time 1123   Pt arrived late   PT Stop Time 1157    PT Time Calculation (min) 34 min    Activity Tolerance Patient tolerated treatment well    Behavior During Therapy WFL for tasks assessed/performed                         Past Medical History:  Diagnosis Date   Anxiety    Arthritis    Back pain    Brain fog    CHF (congestive heart failure) (HCC)    pt not aware of this   Depression    Diabetes mellitus without complication (HCC)    Difficulty walking    Dysmetabolic syndrome X    Fatigue    Fibromyalgia    neuropathy   Hypertension    Hypertriglyceridemia    Joint pain    Leg weakness    Lower extremity edema    Muscle atrophy    Neuropathy    Obesity    Osteoarthritis    PONV (postoperative nausea and vomiting)    Poor memory    Post-menopausal bleeding    Prediabetes    Rosacea    Sleep apnea    not on cpap yet   Tetanus vaccine causing adverse effect in therapeutic use    Vitamin D deficiency    Past Surgical History:  Procedure Laterality Date   BONE TUMOR EXCISION  1975   Right leg   DILATATION & CURETTAGE/HYSTEROSCOPY WITH MYOSURE N/A 10/07/2019   Procedure: DILATATION AND CURETTAGE /HYSTEROSCOPY, Myosure, Polypectomy;  Surgeon: [place] Bing, MD;  Location: [facility];  Service: Gynecology;  Laterality: N/A;   ENDOMETRIAL BIOPSY  07/14/2019       epidural steroid injection     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   Procedure: LAPAROSCOPIC ROUX-EN-Y GASTRIC BYPASS WITH UPPER ENDOSCOPY;  Surgeon: Sheliah Hatch, De Blanch, MD;  Location: [facility];  Service: General;  Laterality: N/A;    Patient Active Problem List   Diagnosis Date Noted   GERD (gastroesophageal reflux disease) 07/03/2022   Preventative health care 07/03/2022   S/P gastric bypass 06/20/2021   Morbid (severe) obesity due to excess calories (HCC) 04/04/2021   Near syncope 10/04/2020   Severe obstructive sleep apnea-hypopnea syndrome 09/23/2019   Class 2 obesity due to excess calories with body mass index (BMI) of 38.0 to 38.9 in adult 09/23/2019   Excessive daytime sleepiness 08/07/2019   Acute diastolic congestive heart failure (HCC) 08/07/2019   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 07/07/2019   Post-menopausal bleeding 06/17/2019   Vaginal burning 06/17/2019   Urinary incontinence 06/17/2019   Lower extremity weakness 05/13/2019   Poor short term memory 05/13/2019   Left groin pain 03/18/2019   Rosacea 08/25/2018   Chronic venous insufficiency 08/07/2018   PAD (peripheral artery disease) (HCC) 08/07/2018   Chronic bilateral low back pain with left-sided sciatica 07/04/2018   Vitamin B12 deficiency 05/23/2018   Arthralgia of multiple sites 05/01/2018   Abnormal TSH 03/17/2018   GAD (generalized anxiety disorder) 02/24/2018  Vitamin D deficiency 02/24/2018   Bilateral hand pain 04/06/2014   Bilateral wrist pain 04/06/2014   Chronic fatigue 03/15/2014   Palpitations 09/20/2010   Essential hypertension, benign 09/14/2010   HYPERTRIGLYCERIDEMIA 10/04/2009   Lower extremity pain 10/04/2009   Type 2 diabetes mellitus with hyperglycemia (HCC) 08/12/2009   OBESITY, MORBID 08/12/2009   MDD (major depressive disorder) 08/12/2009    REFERRING DIAG: M75.41 (ICD-10-CM) - Impingement syndrome of right shoulder M75.81 (ICD-10-CM) - Rotator cuff tendonitis, right   THERAPY DIAG:  Chronic right shoulder pain  Cervicalgia  Stiffness of right shoulder, not elsewhere classified  Rationale for Evaluation and Treatment Rehabilitation  PERTINENT HISTORY: M75.41 (ICD-10-CM) -  Impingement syndrome of right shoulder M75.81 (ICD-10-CM) - Rotator cuff tendonitis, right. Pain would be at posterior R shoulder, upper trap area, R shoulder joint, and R arm. Got a cortisone shot on 07/11/2022 which helped. Pain began about 2 years ago, gradual onset. Pt is R hand dominant. Pain has overall worsened since onset, but the cortisone shot provided some relief. Pt also reports R lateral neck pain.   PRECAUTIONS: No known precautions   SUBJECTIVE:   SUBJECTIVE STATEMENT: R posterior shoulder is ok. 4/10 currently.      PAIN:  Are you having pain? See subjective   TODAY'S TREATMENT:                                                                                                                                         DATE: 10/18/2022  Blood pressure is controlled.  DM controlled.    No latex allergies.    Pressure to R upper trap feels better per pt.    TTP R distal scalene area    Manual therapy Supine with R arm in flexion                 Posterior lateral glide to R humeral head grade 3+ to 4   Supine with R arm in abduction                 Posterior inferior glide to R humeral head grade 3+to 4   Supine STM R teres major muscle to decrease tension with R shoulder in flexion        Therapeutic exercises  Supine R pectoralis stretch 30 seconds x 5  Supine B shoulder horizontal abduction 10x5 seconds for 3 sets  Omega rows  Plate 15 for 16X0  Low back discomfort, eases with rest.     Improved exercise technique, movement at target joints, use of target muscles after mod verbal, visual, tactile cues.     Response to treatment Fair tolerance to today's session.      Clinical impression  Pt arrived late so session was adjusted accordingly. Continued working on improving R shoulder joint mobility as well as decreasing R teres major muscle tension and improving scapular retraction and posterior tipping to  decrease stress to R shoulder joint when  reaching. Difficulty with proper scapular mechanics with tendency for R shoulder shrug and anterior tipping, cues needed to help address. Fair tolerance to today's session.  Pt will benefit from continued skilled physical therapy services to decrease pain, improve strength, AROM, and function.        PATIENT EDUCATION: Education details: there-ex, HEP Person educated: Patient Education method: Explanation, Demonstration, Tactile cues, Verbal cues, and Handouts Education comprehension: verbalized understanding and returned demonstration  HOME EXERCISE PROGRAM: Access Code: [email or web address] URL: [email or web address] Date: 07/30/2022 Prepared by: Loralyn Freshwater  Exercises - Seated Scapular Retraction  - 2 x daily - 7 x weekly - 3 sets - 10 reps - 5 seconds hold - Supine Cervical Retraction with Towel  - 2 x daily - 7 x weekly - 3 sets - 10 reps - 5 seconds hold - Standing Shoulder External Rotation with Resistance  - 1 x daily - 7 x weekly - 3 sets - 10 reps - Seated Isometric Shoulder Internal Rotation at Chair  - 1 x daily - 7 x weekly - 3 sets - 10 reps - 5 seconds hold  - First Rib Mobilization with Strap  - 3 x daily - 7 x weekly - 1 sets - 3-5 reps - 30 seconds hold  - Supine Elbow Flexion Extension AROM  - 1 x daily - 7 x weekly - 3 sets - 10 reps - 5 second hold  - Tricep Push Up on Wall  - 1 x daily - 7 x weekly - 3 sets - 10 reps  Held off on 10/11/2022  - Seated Shoulder Inferior Glide  - 1 x daily - 7 x weekly - 3 sets - 10 reps - 5 seconds hold  R first rib stretch with strap with R and L cervical rotation 10x3 each direction.   - Supine Shoulder Horizontal Abduction with Dumbbells  - 1 x daily - 7 x weekly - 3 sets - 10 reps - 5 seconds  hold    PT Short Term Goals - 09/20/22 1000       PT SHORT TERM GOAL #1   Title Pt will be independent with her initial HEP to decrease pain, improve strength, and ability to reach up and behind her more comfortably.     Baseline Was not able to do her HEP due to her R hip hurting, then vacation (09/20/2022)    Time 3    Period Weeks    Status On-going    Target Date 10/12/22              PT Long Term Goals - 09/26/22 1527       PT LONG TERM GOAL #1   Title Pt will have a decrease in R shoulder pain to 3/10 or less at worst to promote ability to reach up, reach behind her, as well as lay on her R side to sleep more comfortably.    Baseline 6/10 R shoulder pain at worst for the past 3 months (07/25/2022); 6.5/10 at most for the past 7 days (09/04/2022); 8/10 at worst for the past 7 days (has not been doing her HEP, was on vacation) 09/20/22; 6/10 at worst for the past 7 days (09/26/2022)    Time 8    Period Weeks    Status On-going    Target Date 11/15/22      PT LONG TERM GOAL #2   Title Pt will improve R shoulder FOTO score by  at least 10 points as a demonstration of improved function.    Baseline R shoulder FOTO 54 (07/25/2022)    Time 3    Period Weeks    Status On-going    Target Date 11/16/22      PT LONG TERM GOAL #3   Title Pt will improve R shoulder IR and ER strength by at least 1/2 MMT grade to promote ability to reach more comfortably.    Baseline R shoulder IR 4/5, ER 4/5 (07/25/2022); 4/5 IR, 4/5 ER (09/20/2022); 4+/5 IR, 4/5 ER (09/26/2022)    Time 8    Period Weeks    Status Partially Met    Target Date 11/16/22      PT LONG TERM GOAL #4   Title Pt will improve R shoulder AROM to at least 150 degrees flexion and abduction to promote ability to reach more comfortably.    Baseline R shoulder AROM 117 degrees flexion, 128 degrees abduction (07/25/2022); 121 degrees flexion, 106 degrees abduction but can go to about 139 degrees AAROM (09/04/2022); 135 degrees flexion AROM, 107 degrees abduction AROM (09/20/2022); 123 degrees flexion, 140 degrees abduction (09/26/2022)    Time 8    Period Weeks    Status Partially Met    Target Date 11/16/22      PT LONG TERM GOAL #5   Title Pt will improve  R shoulder functional IR to R thumb to T7 spinous process to promote ability to don and doff clothes as well s perform self care more comfortably.    Baseline R shoulder functional IR: R thumb to T10 spinous process (07/25/2022); R thumb to L2 spinous process (09/20/2022); R thumb to T9 Spinous process (09/26/2022)    Time 8    Period Weeks    Status On-going    Target Date 11/16/22              Plan - 10/18/22 1123     Clinical Impression Statement Pt arrived late so session was adjusted accordingly. Continued working on improving R shoulder joint mobility as well as decreasing R teres major muscle tension and improving scapular retraction and posterior tipping to decrease stress to R shoulder joint when reaching. Difficulty with proper scapular mechanics with tendency for R shoulder shrug and anterior tipping, cues needed to help address. Fair tolerance to today's session.  Pt will benefit from continued skilled physical therapy services to decrease pain, improve strength, AROM, and function.    Personal Factors and Comorbidities Comorbidity 3+;Fitness;Past/Current Experience;Time since onset of injury/illness/exacerbation    Comorbidities Anxiety, back pain, arthritis, CHF, depression, HTN, DM, fibromyalgia, neuropathy    Examination-Activity Limitations Bathing;Reach Overhead;Bed Mobility;Dressing;Hygiene/Grooming;Toileting;Lift;Sleep    Stability/Clinical Decision Making Stable/Uncomplicated   Pt states pain is worsening overall since onset   Rehab Potential Fair    PT Frequency 2x / week    PT Duration 8 weeks    PT Treatment/Interventions Therapeutic activities;Functional mobility training;Therapeutic exercise;Neuromuscular re-education;Patient/family education;Manual techniques;Dry needling;Joint Manipulations;Spinal Manipulations;Electrical Stimulation;Iontophoresis 4mg /ml Dexamethasone;Traction   traction and/or manipulations if appropriate   PT Next Visit Plan thoracic extension,  scapular, anterior cervical, ER, IR strengthening, manual techniques, modalities PRN    PT Home Exercise Plan medbridge Access Code: [email or web address]    Consulted and Agree with Plan of Care Patient             Loralyn Freshwater PT, DPT     10/18/2022, 12:05 PM

## 2022-10-18 NOTE — Progress Notes (Signed)
[facility] Counselor/Therapist Progress Note  Patient ID: Brandy [name], MRN: [id number]   Date: 10/18/22  Time Spent: 12:05 pm - 12:53 pm : 48 Minutes  Treatment Type: Individual Therapy.  Reported Symptoms: depression and anxiety.  Mental Status Exam: Appearance:  Casual     Behavior: Appropriate  Motor: Normal  Speech/Language:  Clear and Coherent  Affect: Congruent  Mood: normal  Thought process: normal  Thought content:   WNL  Sensory/Perceptual disturbances:   WNL  Orientation: oriented to person, place, time/date, and situation  Attention: Good  Concentration: Good  Memory: WNL  Fund of knowledge:  Good  Insight:   Good  Judgment:  Good  Impulse Control: Good   Risk Assessment: Danger to Self:  No Self-injurious Behavior: No Danger to Others: No Duty to Warn:no Physical Aggression / Violence:No  Access to Firearms a concern: No  Gang Involvement:No   Subjective:   Brandy [name] participated from car, via video, is aware of limitations, and consented to treatment. Therapist participated from home office. Brandy [name] reviewed the events of the past week. Brandy [name] noted last Friday noted feeling "defeated". We processed the events prior to this and noted feeling of disappointment in various areas. She noted feeling sad and defeated. We explored various situations that led to her feelings and frustration. We identified poor behavior from her two of her children &  disappointment of missing an opportunity. We processed this during the session and highlighted boundary and communication issues. We explored this during the session and her attempts to address these issues via boundaries and consistency. Therapist modeled assertiveness and boundary setting  and provided psycho-education about boundary setting. Therapist encouraged daily check-ins to identify mood, thoughts, and current stressors. Brandy [name] was engaged and motivated during the session. She expressed commitment  towards our goals. Therapist provided supportive therapy. Brandy [name] continues to benefit from treatment.   Interventions:  CBT & interpersonal.   Diagnosis:  Episode of recurrent major depressive disorder, unspecified depression episode severity [facility])  Psychiatric Treatment: Yes , [name], MD.     Treatment Plan:  [name], self-aware, and motivated for change.   Support System: Family and friends.    Client Treatment Preferences Outpatient therapy.   Client Statement of Needs Brandy [name] would like to focus on self-care, eating healthfully, engaging in consistent exercise, focusing on sense of self,  being more organized and goal oriented, process past events including childhood, reduce negative self-talk.   Treatment Level Weekly  Symptoms  Depression: loss of interest, feeling down, poor sleep, lethargy, fluctuating appetite, feeling bad about self, and trouble concentrating.    (Status: maintained) Anxiety:  feeling anxious, difficulty managing worry, worrying about different things, trouble relaxing, restlessness, and feeling afraid as if something awful might happen.   (Status: maintained)  Goals:   Brandy [name] experiences symptoms of depression and anxiety.    Target Date: 07/19/23 Frequency: Weekly  Progress: 0 Modality: individual    Therapist will provide referrals for additional resources as appropriate.  Therapist will provide psycho-education regarding Brandy [name]'s diagnosis and corresponding treatment approaches and interventions. Licensed Clinical Social Worker, [place], LCSW will support the patient's ability to achieve the goals identified. will employ CBT, BA, Problem-solving, Solution Focused, Mindfulness,  coping skills, & other evidenced-based practices will be used to promote progress towards healthy functioning to help manage decrease symptoms associated with her diagnosis.   Reduce overall level, frequency, and intensity of the  feelings of depression & anxiety and evidenced by  decreased overall symptoms  from 6 to 7 days/week to 0 to 1 days/week per client report for at least 3 consecutive months. Verbally express understanding of the relationship between feelings of depression, anxiety and their impact on thinking patterns and behaviors. Verbalize an understanding of the role that distorted thinking plays in creating fears, excessive worry, and ruminations.    Brandy [name] participated in the creation of the treatment plan)  [name], LCSW

## 2022-10-23 ENCOUNTER — Ambulatory Visit: Payer: HMO

## 2022-10-25 ENCOUNTER — Ambulatory Visit (INDEPENDENT_AMBULATORY_CARE_PROVIDER_SITE_OTHER): Payer: HMO | Admitting: Psychology

## 2022-10-25 ENCOUNTER — Ambulatory Visit: Payer: HMO

## 2022-10-25 DIAGNOSIS — M542 Cervicalgia: Secondary | ICD-10-CM

## 2022-10-25 DIAGNOSIS — F339 Major depressive disorder, recurrent, unspecified: Secondary | ICD-10-CM | POA: Diagnosis not present

## 2022-10-25 DIAGNOSIS — M25511 Pain in right shoulder: Secondary | ICD-10-CM | POA: Diagnosis not present

## 2022-10-25 DIAGNOSIS — M25611 Stiffness of right shoulder, not elsewhere classified: Secondary | ICD-10-CM

## 2022-10-25 DIAGNOSIS — G8929 Other chronic pain: Secondary | ICD-10-CM

## 2022-10-25 NOTE — Therapy (Signed)
OUTPATIENT PHYSICAL THERAPY TREATMENT NOTE   Patient Name: Brandy [name] MRN: [id number] DOB:22-Jun-1965, 57 y.o., female Today's Date: 10/25/2022  PCP: Doreene Nest, NP  REFERRING PROVIDER: Hannah Beat, MD  END OF SESSION:  PT End of Session - 10/25/22 1122     Visit Number 15    Number of Visits 17    Date for PT Re-Evaluation 11/16/22    PT Start Time 1122    PT Stop Time 1201    PT Time Calculation (min) 39 min    Activity Tolerance Patient tolerated treatment well    Behavior During Therapy WFL for tasks assessed/performed                          Past Medical History:  Diagnosis Date   Anxiety    Arthritis    Back pain    Brain fog    CHF (congestive heart failure) (HCC)    pt not aware of this   Depression    Diabetes mellitus without complication (HCC)    Difficulty walking    Dysmetabolic syndrome X    Fatigue    Fibromyalgia    neuropathy   Hypertension    Hypertriglyceridemia    Joint pain    Leg weakness    Lower extremity edema    Muscle atrophy    Neuropathy    Obesity    Osteoarthritis    PONV (postoperative nausea and vomiting)    Poor memory    Post-menopausal bleeding    Prediabetes    Rosacea    Sleep apnea    not on cpap yet   Tetanus vaccine causing adverse effect in therapeutic use    Vitamin D deficiency    Past Surgical History:  Procedure Laterality Date   BONE TUMOR EXCISION  1975   Right leg   DILATATION & CURETTAGE/HYSTEROSCOPY WITH MYOSURE N/A 10/07/2019   Procedure: DILATATION AND CURETTAGE /HYSTEROSCOPY, Myosure, Polypectomy;  Surgeon: Lebanon Bing, MD;  Location: [facility];  Service: Gynecology;  Laterality: N/A;   ENDOMETRIAL BIOPSY  07/14/2019       epidural steroid injection     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   Procedure: LAPAROSCOPIC ROUX-EN-Y GASTRIC BYPASS WITH UPPER ENDOSCOPY;  Surgeon: Sheliah Hatch, De Blanch, MD;  Location: [facility];  Service: General;  Laterality: N/A;   Patient  Active Problem List   Diagnosis Date Noted   GERD (gastroesophageal reflux disease) 07/03/2022   Preventative health care 07/03/2022   S/P gastric bypass 06/20/2021   Morbid (severe) obesity due to excess calories (HCC) 04/04/2021   Near syncope 10/04/2020   Severe obstructive sleep apnea-hypopnea syndrome 09/23/2019   Class 2 obesity due to excess calories with body mass index (BMI) of 38.0 to 38.9 in adult 09/23/2019   Excessive daytime sleepiness 08/07/2019   Acute diastolic congestive heart failure (HCC) 08/07/2019   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 07/07/2019   Post-menopausal bleeding 06/17/2019   Vaginal burning 06/17/2019   Urinary incontinence 06/17/2019   Lower extremity weakness 05/13/2019   Poor short term memory 05/13/2019   Left groin pain 03/18/2019   Rosacea 08/25/2018   Chronic venous insufficiency 08/07/2018   PAD (peripheral artery disease) (HCC) 08/07/2018   Chronic bilateral low back pain with left-sided sciatica 07/04/2018   Vitamin B12 deficiency 05/23/2018   Arthralgia of multiple sites 05/01/2018   Abnormal TSH 03/17/2018   GAD (generalized anxiety disorder) 02/24/2018   Vitamin D deficiency  02/24/2018   Bilateral hand pain 04/06/2014   Bilateral wrist pain 04/06/2014   Chronic fatigue 03/15/2014   Palpitations 09/20/2010   Essential hypertension, benign 09/14/2010   HYPERTRIGLYCERIDEMIA 10/04/2009   Lower extremity pain 10/04/2009   Type 2 diabetes mellitus with hyperglycemia (HCC) 08/12/2009   OBESITY, MORBID 08/12/2009   MDD (major depressive disorder) 08/12/2009    REFERRING DIAG: M75.41 (ICD-10-CM) - Impingement syndrome of right shoulder M75.81 (ICD-10-CM) - Rotator cuff tendonitis, right   THERAPY DIAG:  Chronic right shoulder pain  Cervicalgia  Stiffness of right shoulder, not elsewhere classified  Rationale for Evaluation and Treatment Rehabilitation  PERTINENT HISTORY: M75.41 (ICD-10-CM) - Impingement syndrome  of right shoulder M75.81 (ICD-10-CM) - Rotator cuff tendonitis, right. Pain would be at posterior R shoulder, upper trap area, R shoulder joint, and R arm. Got a cortisone shot on 07/11/2022 which helped. Pain began about 2 years ago, gradual onset. Pt is R hand dominant. Pain has overall worsened since onset, but the cortisone shot provided some relief. Pt also reports R lateral neck pain.   PRECAUTIONS: No known precautions   SUBJECTIVE:   SUBJECTIVE STATEMENT: R posterior shoulder is ok. 5/10 currently. 8/10 R upper trap and posterior shoulder at most for the past 7 days. The more she uses her shoulder with exercises and housework (scrubbing, dishes) feels good during that day but feels super tender and guards her shoulder the next day. The stretches and manual therapy feel good but does not feel like the shoulder is getting stronger.      PAIN:  Are you having pain? See subjective   TODAY'S TREATMENT:                                                                                                                                         DATE: 10/25/2022  Blood pressure is controlled.  DM controlled.    No latex allergies.    Pressure to R upper trap feels better per pt.    TTP R distal scalene area     Therapeutic exercises  Seated chin tuck with thoracic extension over chair 10x5 seconds for 3 sets to decrease pressure to R UE nerves.   Seated B shoulder ER red band 10x3  Feels better with A to P pressure to humeral head by PT  Seated R shoulder extension with scapular retraction 10x2 green band  Then with green band attached at top of door, with arm moving from 90 degrees flexion to 40 degrees flexion to decrease anterior pressure to shoulder 10x5 seconds for 2 sets      Improved exercise technique, movement at target joints, use of target muscles after mod verbal, visual, tactile cues.     Response to treatment Fair tolerance to today's session.      Clinical  impression Focused more on infraspinatus, posterior shoulder and scapular strengthening to improve glenohumeral mechanics as well as  decrease anterior stress to R shoulder joint. Fair tolerance to today's session.   Pt will benefit from continued skilled physical therapy services to decrease pain, improve strength, AROM, and function.        PATIENT EDUCATION: Education details: there-ex, HEP Person educated: Patient Education method: Explanation, Demonstration, Tactile cues, Verbal cues, and Handouts Education comprehension: verbalized understanding and returned demonstration  HOME EXERCISE PROGRAM: Access Code: [email or web address] URL: [email or web address] Date: 07/30/2022 Prepared by: Loralyn Freshwater  Exercises - Seated Scapular Retraction  - 2 x daily - 7 x weekly - 3 sets - 10 reps - 5 seconds hold - Supine Cervical Retraction with Towel  - 2 x daily - 7 x weekly - 3 sets - 10 reps - 5 seconds hold - Standing Shoulder External Rotation with Resistance  - 1 x daily - 7 x weekly - 3 sets - 10 reps  Upgraded to green band on 10/25/2022  - Seated Isometric Shoulder Internal Rotation at Chair  - 1 x daily - 7 x weekly - 3 sets - 10 reps - 5 seconds hold  - First Rib Mobilization with Strap  - 3 x daily - 7 x weekly - 1 sets - 3-5 reps - 30 seconds hold  - Supine Elbow Flexion Extension AROM  - 1 x daily - 7 x weekly - 3 sets - 10 reps - 5 second hold  - Tricep Push Up on Wall  - 1 x daily - 7 x weekly - 3 sets - 10 reps  Held off on 10/11/2022  - Seated Shoulder Inferior Glide  - 1 x daily - 7 x weekly - 3 sets - 10 reps - 5 seconds hold  R first rib stretch with strap with R and L cervical rotation 10x3 each direction.   - Supine Shoulder Horizontal Abduction with Dumbbells  - 1 x daily - 7 x weekly - 3 sets - 10 reps - 5 seconds  hold  - Seated Shoulder Extension and Scapular Retraction with Resistance  - 1 x daily - 7 x weekly - 3 sets - 10 reps - 5 seconds hold     PT  Short Term Goals - 09/20/22 1000       PT SHORT TERM GOAL #1   Title Pt will be independent with her initial HEP to decrease pain, improve strength, and ability to reach up and behind her more comfortably.    Baseline Was not able to do her HEP due to her R hip hurting, then vacation (09/20/2022)    Time 3    Period Weeks    Status On-going    Target Date 10/12/22              PT Long Term Goals - 09/26/22 1527       PT LONG TERM GOAL #1   Title Pt will have a decrease in R shoulder pain to 3/10 or less at worst to promote ability to reach up, reach behind her, as well as lay on her R side to sleep more comfortably.    Baseline 6/10 R shoulder pain at worst for the past 3 months (07/25/2022); 6.5/10 at most for the past 7 days (09/04/2022); 8/10 at worst for the past 7 days (has not been doing her HEP, was on vacation) 09/20/22; 6/10 at worst for the past 7 days (09/26/2022)    Time 8    Period Weeks    Status On-going    Target Date 11/15/22  PT LONG TERM GOAL #2   Title Pt will improve R shoulder FOTO score by at least 10 points as a demonstration of improved function.    Baseline R shoulder FOTO 54 (07/25/2022)    Time 3    Period Weeks    Status On-going    Target Date 11/16/22      PT LONG TERM GOAL #3   Title Pt will improve R shoulder IR and ER strength by at least 1/2 MMT grade to promote ability to reach more comfortably.    Baseline R shoulder IR 4/5, ER 4/5 (07/25/2022); 4/5 IR, 4/5 ER (09/20/2022); 4+/5 IR, 4/5 ER (09/26/2022)    Time 8    Period Weeks    Status Partially Met    Target Date 11/16/22      PT LONG TERM GOAL #4   Title Pt will improve R shoulder AROM to at least 150 degrees flexion and abduction to promote ability to reach more comfortably.    Baseline R shoulder AROM 117 degrees flexion, 128 degrees abduction (07/25/2022); 121 degrees flexion, 106 degrees abduction but can go to about 139 degrees AAROM (09/04/2022); 135 degrees flexion AROM, 107 degrees  abduction AROM (09/20/2022); 123 degrees flexion, 140 degrees abduction (09/26/2022)    Time 8    Period Weeks    Status Partially Met    Target Date 11/16/22      PT LONG TERM GOAL #5   Title Pt will improve R shoulder functional IR to R thumb to T7 spinous process to promote ability to don and doff clothes as well s perform self care more comfortably.    Baseline R shoulder functional IR: R thumb to T10 spinous process (07/25/2022); R thumb to L2 spinous process (09/20/2022); R thumb to T9 Spinous process (09/26/2022)    Time 8    Period Weeks    Status On-going    Target Date 11/16/22              Plan - 10/25/22 1122     Clinical Impression Statement Focused more on infraspinatus, posterior shoulder and scapular strengthening to improve glenohumeral mechanics as well as decrease anterior stress to R shoulder joint. Fair tolerance to today's session.   Pt will benefit from continued skilled physical therapy services to decrease pain, improve strength, AROM, and function.    Personal Factors and Comorbidities Comorbidity 3+;Fitness;Past/Current Experience;Time since onset of injury/illness/exacerbation    Comorbidities Anxiety, back pain, arthritis, CHF, depression, HTN, DM, fibromyalgia, neuropathy    Examination-Activity Limitations Bathing;Reach Overhead;Bed Mobility;Dressing;Hygiene/Grooming;Toileting;Lift;Sleep    Stability/Clinical Decision Making Stable/Uncomplicated   Pt states pain is worsening overall since onset   Rehab Potential Fair    PT Frequency 2x / week    PT Duration 8 weeks    PT Treatment/Interventions Therapeutic activities;Functional mobility training;Therapeutic exercise;Neuromuscular re-education;Patient/family education;Manual techniques;Dry needling;Joint Manipulations;Spinal Manipulations;Electrical Stimulation;Iontophoresis 4mg /ml Dexamethasone;Traction   traction and/or manipulations if appropriate   PT Next Visit Plan thoracic extension, scapular, anterior  cervical, ER, IR strengthening, manual techniques, modalities PRN    PT Home Exercise Plan medbridge Access Code: [email or web address]    Consulted and Agree with Plan of Care Patient             Loralyn Freshwater PT, DPT     10/25/2022, 12:15 PM

## 2022-10-25 NOTE — Progress Notes (Signed)
[facility] Counselor/Therapist Progress Note  Patient ID: Brandy [name], MRN: [id number]   Date: 10/25/22  Time Spent: 12:05 pm - 12:57 pm : 52 Minutes  Treatment Type: Individual Therapy.  Reported Symptoms: depression and anxiety.  Mental Status Exam: Appearance:  Casual     Behavior: Appropriate  Motor: Normal  Speech/Language:  Clear and Coherent  Affect: Congruent  Mood: normal  Thought process: normal  Thought content:   WNL  Sensory/Perceptual disturbances:   WNL  Orientation: oriented to person, place, time/date, and situation  Attention: Good  Concentration: Good  Memory: WNL  Fund of knowledge:  Good  Insight:   Good  Judgment:  Good  Impulse Control: Good   Risk Assessment: Danger to Self:  No Self-injurious Behavior: No Danger to Others: No Duty to Warn:no Physical Aggression / Violence:No  Access to Firearms a concern: No  Gang Involvement:No   Subjective:   Brandy [name] participated from car, via video, is aware of limitations, and consented to treatment. Therapist participated from home office. Brandy [name] reviewed the events of the past week. Brandy [name] noted a need to discuss her unmet needs during the session and discussed unmet needs in her relationship with her husband. We worked on exploring this during the session and Brandy [name]'s efforts to address said needs. She noted often feeling less confident, less worthy, and desirable when needs are not met. She noted the possibility of her mental health and mood affecting this space. We explored this during the session. Therapist encouraged Brandy [name] to further delineating her needs, communicating needs to her husband, and working together to have her and his needs met. Therapist modeled this during the session. Brandy [name] was engaged and motivated during the session. She expressed commitment towards our work and session goals. Therapist validated and normalized Brandy [name]'s feelings and experience and provided supportive  therapy. Brandy [name] continues to benefit from treatment and is consistent in her attendance and appreciation.   Interventions:  CBT & interpersonal.   Diagnosis:  Episode of recurrent major depressive disorder, unspecified depression episode severity [name])  Psychiatric Treatment: Yes , [name], MD.     Treatment Plan:  Client Abilities/Strengths [name] is intelligent, self-aware, and motivated for change.   Support System: Family and friends.    Client Treatment Preferences Outpatient therapy.   Client Statement of Needs [name] would like to focus on self-care, eating healthfully, engaging in consistent exercise, focusing on sense of self,  being more organized and goal oriented, process past events including childhood, reduce negative self-talk.   Treatment Level Weekly  Symptoms  Depression: loss of interest, feeling down, poor sleep, lethargy, fluctuating appetite, feeling bad about self, and trouble concentrating.    (Status: maintained) Anxiety:  feeling anxious, difficulty managing worry, worrying about different things, trouble relaxing, restlessness, and feeling afraid as if something awful might happen.   (Status: maintained)  Goals:   [name] experiences symptoms of depression and anxiety.    Target Date: 07/19/23 Frequency: Weekly  Progress: 0 Modality: individual    Therapist will provide referrals for additional resources as appropriate.  Therapist will provide psycho-education regarding [name]'s diagnosis and corresponding treatment approaches and interventions. Licensed Clinical Social Worker, [address], LCSW will support the patient's ability to achieve the goals identified. will employ CBT, BA, Problem-solving, Solution Focused, Mindfulness,  coping skills, & other evidenced-based practices will be used to promote progress towards healthy functioning to help manage decrease symptoms associated with her diagnosis.   Reduce overall level, frequency, and intensity of  the feelings of depression & anxiety and evidenced by decreased overall symptoms  from 6 to 7 days/week to 0 to 1 days/week per client report for at least 3 consecutive months. Verbally express understanding of the relationship between feelings of depression, anxiety and their impact on thinking patterns and behaviors. Verbalize an understanding of the role that distorted thinking plays in creating fears, excessive worry, and ruminations.    [name] participated in the creation of the treatment plan)  [name], LCSW

## 2022-11-08 ENCOUNTER — Ambulatory Visit: Payer: HMO | Admitting: Psychology

## 2022-11-08 DIAGNOSIS — F339 Major depressive disorder, recurrent, unspecified: Secondary | ICD-10-CM | POA: Diagnosis not present

## 2022-11-08 NOTE — Progress Notes (Signed)
[facility] Counselor/Therapist Progress Note  Patient ID: Brandy [name], MRN: [id number]   Date: 11/08/22  Time Spent: 12:05 pm - 12:58 pm : 53 Minutes  Treatment Type: Individual Therapy.  Reported Symptoms: depression and anxiety.  Mental Status Exam: Appearance:  Casual     Behavior: Appropriate  Motor: Normal  Speech/Language:  Clear and Coherent  Affect: Congruent  Mood: normal  Thought process: normal  Thought content:   WNL  Sensory/Perceptual disturbances:   WNL  Orientation: oriented to person, place, time/date, and situation  Attention: Good  Concentration: Good  Memory: WNL  Fund of knowledge:  Good  Insight:   Good  Judgment:  Good  Impulse Control: Good   Risk Assessment: Danger to Self:  No Self-injurious Behavior: No Danger to Others: No Duty to Warn:no Physical Aggression / Violence:No  Access to Firearms a concern: No  Gang Involvement:No   Subjective:   Brandy [name] participated from car, via video, is aware of limitations, and consented to treatment. Therapist participated from home office. Brandy [name] reviewed the events of the past week. Brandy [name] noted visiting family, out of state, and noted this being enjoyable and positive. Brandy [name] noted her eldest aunt's ailing health and noted being tearful when leaving her. Brandy [name] noted the disclosure, from family, that her own family was excluded from family outings and experiences. Brandy [name] could not identify any reason for this. Brandy [name] noted having difficulty with her husband to compromise on getting a family dog. Brandy [name] noted difficulty in finding common ground on issues in which they have different perspectives. Brandy [name] noted often feeling "shut down" when Brandy [name] communicates a need, want, or general hope. We worked on exploring her past interactions with Ed in relation to compromising and communication. We worked on identifying ways to communicate concerns, employ empathy, and and resolve conflict positively. Therapist modeled  this during the session and encouraged employing said tools between sessions. Brandy [name] was engaged and motivated during the session. Brandy [name] expressed commitment regarding treatment goals and session goals.  Therapist validated and normalized Brandy [name]'s feelings and provided supportive therapy.  Interventions:  CBT & interpersonal.   Diagnosis:  Episode of recurrent major depressive disorder, unspecified depression episode severity [facility])  Psychiatric Treatment: Yes , [name], MD.     Treatment Plan:  [name], self-aware, and motivated for change.   Support System: Family and friends.    Client Treatment Preferences Outpatient therapy.   Client Statement of Needs Brandy [name] would like to focus on self-care, eating healthfully, engaging in consistent exercise, focusing on sense of self,  being more organized and goal oriented, process past events including childhood, reduce negative self-talk.   Treatment Level Weekly  Symptoms  Depression: loss of interest, feeling down, poor sleep, lethargy, fluctuating appetite, feeling bad about self, and trouble concentrating.    (Status: maintained) Anxiety:  feeling anxious, difficulty managing worry, worrying about different things, trouble relaxing, restlessness, and feeling afraid as if something awful might happen.   (Status: maintained)  Goals:   Brandy [name] experiences symptoms of depression and anxiety.    Target Date: 07/19/23 Frequency: Weekly  Progress: 0 Modality: individual    Therapist will provide referrals for additional resources as appropriate.  Therapist will provide psycho-education regarding Brandy [name]'s diagnosis and corresponding treatment approaches and interventions. Licensed Clinical Social Worker, [place], LCSW will support the patient's ability to achieve the goals identified. will employ CBT, BA, Problem-solving, Solution Focused, Mindfulness,  coping skills, & other evidenced-based  practices will be  used to promote progress towards healthy functioning to help manage decrease symptoms associated with her diagnosis.   Reduce overall level, frequency, and intensity of the feelings of depression & anxiety and evidenced by decreased overall symptoms  from 6 to 7 days/week to 0 to 1 days/week per client report for at least 3 consecutive months. Verbally express understanding of the relationship between feelings of depression, anxiety and their impact on thinking patterns and behaviors. Verbalize an understanding of the role that distorted thinking plays in creating fears, excessive worry, and ruminations.    Brandy [name] participated in the creation of the treatment plan)  [name], LCSW

## 2022-11-13 ENCOUNTER — Encounter (HOSPITAL_COMMUNITY): Payer: Self-pay | Admitting: *Deleted

## 2022-11-15 ENCOUNTER — Ambulatory Visit: Payer: Medicare Other | Admitting: Psychology

## 2022-11-20 NOTE — Progress Notes (Signed)
[name] [name], MD, CAQ Sports Medicine [facility] at [facility] [address] [place], [address]  Phone: [phone]  FAX: [phone]  Brandy [name] - 57 y.o. female  MRN [id number]  Date of Birth: 1965-05-06  Date: 11/22/2022  PCP: Doreene Nest, NP  Referral: Doreene Nest, NP  Chief Complaint  Patient presents with   Shoulder Pain    Follow up Right Shoulder   Subjective:   Brandy [name] is a 57 y.o. very pleasant female patient with Body mass index is 38.92 kg/m. who presents with the following:  She is a pleasant lady, I saw her almost 6 months ago for some right-sided shoulder pain and impingement.  I did have her do some formal physical therapy, home rehab program, and we also did an intra-articular injection at that time.  She is here to follow-up today regarding her shoulder.  Shoulder is doing a little bit better, but still is not doing all that great.   Did a good job with PT, but she has not been doing all that great.  Has been doing a lot but not all PT, but she has been compliant with dedicated home exercise program.  Only about 20% better or so.  Initially, felt more to do with her shoulder, thinks some her neck.  She does have posterior neck pain to the right.  She has pain directly at the neck and also at the medial attachments of the trapezius.  She continues to have a painful arc of motion, she has pain with reaching across the body.  She also has pain with flexion.  Does bother her at nighttime.  MRI R shoulder  Review of Systems is noted in the HPI, as appropriate  Patient Active Problem List   Diagnosis Date Noted   GERD (gastroesophageal reflux disease) 07/03/2022   Preventative health care 07/03/2022   S/P gastric bypass 06/20/2021   Morbid (severe) obesity due to excess calories (HCC) 04/04/2021   Near syncope 10/04/2020   Severe obstructive sleep apnea-hypopnea syndrome 09/23/2019   Class 2  obesity due to excess calories with body mass index (BMI) of 38.0 to 38.9 in adult 09/23/2019   Excessive daytime sleepiness 08/07/2019   Acute diastolic congestive heart failure (HCC) 08/07/2019   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 07/07/2019   Post-menopausal bleeding 06/17/2019   Vaginal burning 06/17/2019   Urinary incontinence 06/17/2019   Lower extremity weakness 05/13/2019   Poor short term memory 05/13/2019   Left groin pain 03/18/2019   Rosacea 08/25/2018   Chronic venous insufficiency 08/07/2018   PAD (peripheral artery disease) (HCC) 08/07/2018   Chronic bilateral low back pain with left-sided sciatica 07/04/2018   Vitamin B12 deficiency 05/23/2018   Arthralgia of multiple sites 05/01/2018   Abnormal TSH 03/17/2018   GAD (generalized anxiety disorder) 02/24/2018   Vitamin D deficiency 02/24/2018   Bilateral hand pain 04/06/2014   Bilateral wrist pain 04/06/2014   Chronic fatigue 03/15/2014   Palpitations 09/20/2010   Essential hypertension, benign 09/14/2010   HYPERTRIGLYCERIDEMIA 10/04/2009   Lower extremity pain 10/04/2009   Type 2 diabetes mellitus with hyperglycemia (HCC) 08/12/2009   OBESITY, MORBID 08/12/2009   MDD (major depressive disorder) 08/12/2009    Past Medical History:  Diagnosis Date   Anxiety    Arthritis    Back pain    Brain fog    CHF (congestive heart failure) (HCC)    pt not aware of  this   Depression    Diabetes mellitus without complication (HCC)    Difficulty walking    Dysmetabolic syndrome X    Fatigue    Fibromyalgia    neuropathy   Hypertension    Hypertriglyceridemia    Joint pain    Leg weakness    Lower extremity edema    Muscle atrophy    Neuropathy    Obesity    Osteoarthritis    PONV (postoperative nausea and vomiting)    Poor memory    Post-menopausal bleeding    Prediabetes    Rosacea    Sleep apnea    not on cpap yet   Tetanus vaccine causing adverse effect in therapeutic use     Vitamin D deficiency     Past Surgical History:  Procedure Laterality Date   BONE TUMOR EXCISION  1975   Right leg   DILATATION & CURETTAGE/HYSTEROSCOPY WITH MYOSURE N/A 10/07/2019   Procedure: DILATATION AND CURETTAGE /HYSTEROSCOPY, Myosure, Polypectomy;  Surgeon: [place] Bing, MD;  Location: [facility] OR;  Service: Gynecology;  Laterality: N/A;   ENDOMETRIAL BIOPSY  07/14/2019       epidural steroid injection     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   Procedure: LAPAROSCOPIC ROUX-EN-Y GASTRIC BYPASS WITH UPPER ENDOSCOPY;  Surgeon: Sheliah Hatch, De Blanch, MD;  Location: [facility] ORS;  Service: General;  Laterality: N/A;    Family History  Problem Relation Age of Onset   Rheum arthritis Mother    COPD Mother    Stroke Mother    Depression Mother    Anxiety disorder Mother    Eating disorder Mother    Obesity Mother    Hypertension Father    Depression Father    Alcoholism Father    Heart disease Maternal Grandmother    Stroke Maternal Grandmother    Alcohol abuse Maternal Grandmother    Alcohol abuse Maternal Grandfather    Aneurysm Paternal Grandmother    Autism Son        severe   Diabetes Paternal Aunt    Breast cancer Neg Hx     Social History   Social History Narrative   Best boy      Married; 3 kids (middle child-severely autistic)      No regular exercise     Objective:   BP 100/60 (BP Location: Left Arm, Patient Position: Sitting, Cuff Size: Large)   Pulse 66   Temp 97.9 F (36.6 C) (Temporal)   Ht 5' 7.25" (1.708 m)   Wt 250 lb 6 oz (113.6 kg)   LMP  (LMP Unknown)   SpO2 98%   BMI 38.92 kg/m   GEN: No acute distress; alert,appropriate. PULM: Breathing comfortably in no respiratory distress PSYCH: Normally interactive.   Cervical spine: She does have modest loss of motion and lateral bending, rotation and forward flexion.  She is entirely normal sensation and strength in the elbow, forearm, hand and wrist.  Shoulder: R Inspection: No muscle wasting  or winging Ecchymosis/edema: neg  AC joint, scapula, clavicle: NT Spurling's: neg Abduction: full, 4/5, painful arc of motion Flexion: full, 5/5, painful IR, full, lift-off: 5/5 ER at neutral: full, 4/5, painful AC crossover: pos Neer: pos Hawkins: pos Drop Test: neg Empty Can: pos Supraspinatus insertion: mild-mod T Bicipital groove: NT Speed's: neg Yergason's: neg Sulcus sign: neg Scapular dyskinesis: none C5-T1 intact  Neuro: Sensation intact Grip 5/5   Laboratory and Imaging Data:  Assessment and Plan:     ICD-10-CM   1. Impingement syndrome  of right shoulder  M75.41 DG Shoulder Right    MR Shoulder Right Wo Contrast    2. Rotator cuff tendonitis, right  M75.81 DG Shoulder Right    MR Shoulder Right Wo Contrast    3. Chronic right shoulder pain  M25.511 DG Shoulder Right   G89.29 DG Cervical Spine Complete    MR Shoulder Right Wo Contrast    4. Cervicalgia  M54.2 DG Cervical Spine Complete    5. Calcific tendinitis of right shoulder  M75.31 MR Shoulder Right Wo Contrast     Cervical spine x-ray, patient does have relatively mild degenerative disc disease in the lowest part of the cervical spine.  Globally, the disc spaces are fairly well-preserved.  Shoulder x-ray, most notable finding is a large calcific density in the caudal aspect of the shoulder, corresponding to the supraspinatus and in the soft tissue and adjacent to the greater tuberosity.  At this point, the patient has failed conservative management.  She has had extensive physical therapy, dedicated home rehab program, corticosteroid injection of the affected shoulder, as well as systemic steroids.  Obtain an MRI of the right shoulder without contrast to evaluate for rotator cuff tear, and possible prior occult fracture of the greater tuberosity.  Social: Right now shoulder is impairing ability to maximally exercise and to a lesser extent activities of daily living  Orders placed today for  conditions managed today: Orders Placed This Encounter  Procedures   DG Shoulder Right   DG Cervical Spine Complete   MR Shoulder Right Wo Contrast    Disposition: No follow-ups on file.  [employer] One speech-to-text software was used for transcription in this dictation.  Possible transcriptional errors can occur using Animal nutritionist.   Signed,  Elpidio Galea. Biannca Scantlin, MD   Outpatient Encounter Medications as of 11/22/2022  Medication Sig   acetaminophen (TYLENOL) 650 MG CR tablet Take 1,300 mg by mouth every 8 (eight) hours as needed for pain.   atorvastatin (LIPITOR) 10 MG tablet TAKE 1 TABLET BY MOUTH EVERY DAY FOR CHOLESTEROL   Blood Glucose Monitoring Suppl (ONETOUCH VERIO REFLECT) w/Device KIT 3 (three) times daily.   Blood Glucose Monitoring Suppl DEVI 1 each by Does not apply route in the morning, at noon, and at bedtime. May substitute to any manufacturer covered by patient's insurance.   cetirizine (ZYRTEC) 10 MG tablet TAKE 1 TABLET (10 MG TOTAL) BY MOUTH DAILY. FOR ALLERGIES   citalopram (CELEXA) 40 MG tablet Take 40 mg by mouth daily.   DULoxetine (CYMBALTA) 30 MG capsule Take 30 mg by mouth daily. Take with 60 mg to equal 90 mg daily   DULoxetine (CYMBALTA) 60 MG capsule Take 60 mg by mouth daily. Take with 30 mg to equal 90 mg daily   folic acid (FOLVITE) 1 MG tablet Take 1 mg by mouth daily.   gabapentin (NEURONTIN) 300 MG capsule TAKE 1 CAPSULE BY MOUTH 3 TIMES A DAY AS NEEDED FOR BACK PAIN   glucose blood (ONETOUCH VERIO) test strip TEST BLOOD SUGAR MORNING, NOON AND AT BEDTIME   lamoTRIgine (LAMICTAL) 100 MG tablet Take 100 mg by mouth every evening.   lamoTRIgine (LAMICTAL) 200 MG tablet Take 200 mg by mouth in the morning.   lisinopril (ZESTRIL) 30 MG tablet TAKE 1 TABLET (30 MG TOTAL) BY MOUTH DAILY. FOR BLOOD PRESSURE   metroNIDAZOLE (METROCREAM) 0.75 % cream Apply 1 application topically daily as needed (rosacea).   Multiple Minerals-Vitamins  (CALCIUM-MAGNESIUM-ZINC-D3) TABS Take 2 tablets by mouth daily.  ondansetron (ZOFRAN-ODT) 4 MG disintegrating tablet Dissolve 1 tablet (4 mg total) by mouth every 6 (six) hours as needed for nausea or vomiting.   pantoprazole (PROTONIX) 40 MG tablet Take 1 tablet (40 mg total) by mouth daily.   scopolamine (TRANSDERM-SCOP) 1 MG/3DAYS Place 1 patch (1.5 mg total) onto the skin every 3 (three) days.   traZODone (DESYREL) 50 MG tablet Take 50 mg by mouth at bedtime as needed for sleep.   Vitamin D, Ergocalciferol, (DRISDOL) 1.25 MG (50000 UNIT) CAPS capsule TAKE 1 CAPSULE BY MOUTH ONCE WEEKLY FOR VITAMIN D.   sucralfate (CARAFATE) 1 g tablet Take by mouth.   No facility-administered encounter medications on file as of 11/22/2022.

## 2022-11-22 ENCOUNTER — Ambulatory Visit (INDEPENDENT_AMBULATORY_CARE_PROVIDER_SITE_OTHER): Payer: HMO | Admitting: Family Medicine

## 2022-11-22 ENCOUNTER — Ambulatory Visit (INDEPENDENT_AMBULATORY_CARE_PROVIDER_SITE_OTHER): Payer: HMO | Admitting: Psychology

## 2022-11-22 ENCOUNTER — Encounter: Payer: Self-pay | Admitting: Family Medicine

## 2022-11-22 ENCOUNTER — Ambulatory Visit (INDEPENDENT_AMBULATORY_CARE_PROVIDER_SITE_OTHER)
Admission: RE | Admit: 2022-11-22 | Discharge: 2022-11-22 | Disposition: A | Discharge status: Home / Self Care | Payer: HMO | Source: Ambulatory Visit | Attending: Family Medicine | Admitting: Family Medicine

## 2022-11-22 VITALS — BP 100/60 | HR 66 | Temp 97.9°F | Ht 67.25 in | Wt 250.4 lb

## 2022-11-22 DIAGNOSIS — M7581 Other shoulder lesions, right shoulder: Secondary | ICD-10-CM

## 2022-11-22 DIAGNOSIS — F339 Major depressive disorder, recurrent, unspecified: Secondary | ICD-10-CM | POA: Diagnosis not present

## 2022-11-22 DIAGNOSIS — M25511 Pain in right shoulder: Secondary | ICD-10-CM | POA: Diagnosis not present

## 2022-11-22 DIAGNOSIS — G8929 Other chronic pain: Secondary | ICD-10-CM

## 2022-11-22 DIAGNOSIS — M542 Cervicalgia: Secondary | ICD-10-CM

## 2022-11-22 DIAGNOSIS — M25519 Pain in unspecified shoulder: Secondary | ICD-10-CM | POA: Diagnosis not present

## 2022-11-22 DIAGNOSIS — M7541 Impingement syndrome of right shoulder: Secondary | ICD-10-CM

## 2022-11-22 DIAGNOSIS — M47812 Spondylosis without myelopathy or radiculopathy, cervical region: Secondary | ICD-10-CM | POA: Diagnosis not present

## 2022-11-22 DIAGNOSIS — M7531 Calcific tendinitis of right shoulder: Secondary | ICD-10-CM | POA: Diagnosis not present

## 2022-11-22 DIAGNOSIS — M4802 Spinal stenosis, cervical region: Secondary | ICD-10-CM | POA: Diagnosis not present

## 2022-11-22 NOTE — Progress Notes (Signed)
[facility] Counselor/Therapist Progress Note  Patient ID: Brandy [name], MRN: [id number]   Date: 11/22/22  Time Spent: 12:04 pm - 12:59 pm : 55 Minutes  Treatment Type: Individual Therapy.  Reported Symptoms: depression and anxiety.  Mental Status Exam: Appearance:  Casual     Behavior: Appropriate  Motor: Normal  Speech/Language:  Clear and Coherent  Affect: Congruent  Mood: normal  Thought process: normal  Thought content:   WNL  Sensory/Perceptual disturbances:   WNL  Orientation: oriented to person, place, time/date, and situation  Attention: Good  Concentration: Good  Memory: WNL  Fund of knowledge:  Good  Insight:   Good  Judgment:  Good  Impulse Control: Good   Risk Assessment: Danger to Self:  No Self-injurious Behavior: No Danger to Others: No Duty to Warn:no Physical Aggression / Violence:No  Access to Firearms a concern: No  Gang Involvement:No   Subjective:   Brandy [name] participated from car, via video, is aware of limitations, and consented to treatment. Therapist participated from home office. Brandy [name] reviewed the events of the past week. Brandy [name] noted feeling down, sad, and lonely. She discussed this being in relation to a possible home purchase and the likelihood of this. She noted feeling lonely which leads to feeling of being "unloved". She noted this being a stark difference between her visit with expended family and having the "bucket being full" to return home and it be less fulfilling. We worked on exploring this during the session and worked on identifying what would help "fill the bucket". She noted her husband's general reluctance to try new things, often without any reason for declining the invite. We explored this and ways to communicate needs and further communication past surface level. Brandy [name] noted her hope that this would result in more closeness. Therapist modeled ways to further conversations this during session. Brandy [name] was  receptive during the session and expressed commitment towards goals. Therapist praised Brandy [name] for her effort and provided supportive therapy.   Interventions:  CBT & interpersonal.   Diagnosis:  Episode of recurrent major depressive disorder, unspecified depression episode severity [facility])  Psychiatric Treatment: Yes , [name], MD.     Treatment Plan:  [name], self-aware, and motivated for change.   Support System: Family and friends.    Client Treatment Preferences Outpatient therapy.   Client Statement of Needs Brandy [name] would like to focus on self-care, eating healthfully, engaging in consistent exercise, focusing on sense of self,  being more organized and goal oriented, process past events including childhood, reduce negative self-talk.   Treatment Level Weekly  Symptoms  Depression: loss of interest, feeling down, poor sleep, lethargy, fluctuating appetite, feeling bad about self, and trouble concentrating.    (Status: maintained) Anxiety:  feeling anxious, difficulty managing worry, worrying about different things, trouble relaxing, restlessness, and feeling afraid as if something awful might happen.   (Status: maintained)  Goals:   Brandy [name] experiences symptoms of depression and anxiety.    Target Date: 07/19/23 Frequency: Weekly  Progress: 0 Modality: individual    Therapist will provide referrals for additional resources as appropriate.  Therapist will provide psycho-education regarding Brandy [name]'s diagnosis and corresponding treatment approaches and interventions. Licensed Clinical Social Worker, [place], LCSW will support the patient's ability to achieve the goals identified. will employ CBT, BA, Problem-solving, Solution Focused, Mindfulness,  coping skills, & other evidenced-based practices will be used to promote progress towards healthy functioning to help manage decrease symptoms associated with her diagnosis.  Reduce  overall level, frequency, and intensity of the feelings of depression & anxiety and evidenced by decreased overall symptoms  from 6 to 7 days/week to 0 to 1 days/week per client report for at least 3 consecutive months. Verbally express understanding of the relationship between feelings of depression, anxiety and their impact on thinking patterns and behaviors. Verbalize an understanding of the role that distorted thinking plays in creating fears, excessive worry, and ruminations.    Brandy [name] participated in the creation of the treatment plan)  [name], LCSW

## 2022-11-29 ENCOUNTER — Ambulatory Visit: Payer: HMO | Admitting: Psychology

## 2022-11-29 DIAGNOSIS — F339 Major depressive disorder, recurrent, unspecified: Secondary | ICD-10-CM

## 2022-11-29 NOTE — Progress Notes (Signed)
[facility] Counselor/Therapist Progress Note  Patient ID: [name], MRN: [id number]   Date: 11/29/22  Time Spent: 12:06 pm - 1:00 pm : 54 Minutes  Treatment Type: Individual Therapy.  Reported Symptoms: depression and anxiety.  Mental Status Exam: Appearance:  Casual     Behavior: Appropriate  Motor: Normal  Speech/Language:  Clear and Coherent  Affect: Congruent and Tearful  Mood: dysthymic  Thought process: normal  Thought content:   WNL  Sensory/Perceptual disturbances:   WNL  Orientation: oriented to person, place, time/date, and situation  Attention: Good  Concentration: Good  Memory: WNL  Fund of knowledge:  Good  Insight:   Good  Judgment:  Good  Impulse Control: Good   Risk Assessment: Danger to Self:  No Self-injurious Behavior: No Danger to Others: No Duty to Warn:no Physical Aggression / Violence:No  Access to Firearms a concern: No  Gang Involvement:No   Subjective:   Brandy [name] participated from car, via video, is aware of limitations, and consented to treatment. Therapist participated from home office. Brandy [name] reviewed the events of the past week. She noted her efforts to clean and organize her home. She noted her daughter's upcoming move, across town, and that this will create increased physical distance and be a barrier to spending time with her grand daughter. She noted her attempt to think positively about this move. She noted her effort to set boundaries for self regarding this and not over-extended for her daughter. She noted this being difficult to navigate. She noted a need to be the "fixer" and working toward her "version of them being better" but noted difficulty identifying a driving reason. We explored the perceived benefits of this approach during the session. She noted this being an "expression of love", "feeling needed", "making someone realize they need me", and  " I want someone to understand that I am important in their  world and I can make their world better".  She noted her worry that she will never be the priority to someone. Brandy [name] was tearful during the session. We processed this during the session. "Maybe if they see the value I can bring, they will love me" and noted this tying into her self-worth. "When I don't have that (Feeling needed and loved), my self-worth turns to shit". She noted a general lack of reciprocation from others. Therapist encouraged [name] to identify how others show her they care both verbally and through action. Brandy [name] denied any SI.  Brandy [name] was engaged and motivated and expressed commitment towards our goals. Therapist validated Brandy [name]'s feelings and thoughts and provided supportive therapy.   Interventions:  CBT & interpersonal.   Diagnosis:  Episode of recurrent major depressive disorder, unspecified depression episode severity [name])  Psychiatric Treatment: Yes , [name], MD.     Treatment Plan:  Client Abilities/Strengths [name] is intelligent, self-aware, and motivated for change.   Support System: Family and friends.    Client Treatment Preferences Outpatient therapy.   Client Statement of Needs [name] would like to focus on self-care, eating healthfully, engaging in consistent exercise, focusing on sense of self,  being more organized and goal oriented, process past events including childhood, reduce negative self-talk.   Treatment Level Weekly  Symptoms  Depression: loss of interest, feeling down, poor sleep, lethargy, fluctuating appetite, feeling bad about self, and trouble concentrating.    (Status: maintained) Anxiety:  feeling anxious, difficulty managing worry, worrying about different things, trouble relaxing, restlessness, and feeling afraid as if something awful might  happen.   (Status: maintained)  Goals:   [name] experiences symptoms of depression and anxiety.    Target Date: 07/19/23 Frequency: Weekly  Progress: 0 Modality: individual    Therapist will  provide referrals for additional resources as appropriate.  Therapist will provide psycho-education regarding [name]'s diagnosis and corresponding treatment approaches and interventions. Licensed Clinical Social Worker, [place], LCSW will support the patient's ability to achieve the goals identified. will employ CBT, BA, Problem-solving, Solution Focused, Mindfulness,  coping skills, & other evidenced-based practices will be used to promote progress towards healthy functioning to help manage decrease symptoms associated with her diagnosis.   Reduce overall level, frequency, and intensity of the feelings of depression & anxiety and evidenced by decreased overall symptoms  from 6 to 7 days/week to 0 to 1 days/week per client report for at least 3 consecutive months. Verbally express understanding of the relationship between feelings of depression, anxiety and their impact on thinking patterns and behaviors. Verbalize an understanding of the role that distorted thinking plays in creating fears, excessive worry, and ruminations.    [name] participated in the creation of the treatment plan)  [name], LCSW

## 2022-12-04 ENCOUNTER — Ambulatory Visit (INDEPENDENT_AMBULATORY_CARE_PROVIDER_SITE_OTHER): Payer: HMO | Admitting: Primary Care

## 2022-12-04 VITALS — BP 124/62 | HR 74 | Temp 97.6°F | Ht 67.5 in | Wt 250.0 lb

## 2022-12-04 DIAGNOSIS — I10 Essential (primary) hypertension: Secondary | ICD-10-CM | POA: Diagnosis not present

## 2022-12-04 DIAGNOSIS — E538 Deficiency of other specified B group vitamins: Secondary | ICD-10-CM | POA: Diagnosis not present

## 2022-12-04 DIAGNOSIS — M2550 Pain in unspecified joint: Secondary | ICD-10-CM

## 2022-12-04 DIAGNOSIS — E1165 Type 2 diabetes mellitus with hyperglycemia: Secondary | ICD-10-CM | POA: Diagnosis not present

## 2022-12-04 DIAGNOSIS — G8929 Other chronic pain: Secondary | ICD-10-CM

## 2022-12-04 DIAGNOSIS — M5442 Lumbago with sciatica, left side: Secondary | ICD-10-CM

## 2022-12-04 DIAGNOSIS — R110 Nausea: Secondary | ICD-10-CM

## 2022-12-04 DIAGNOSIS — K219 Gastro-esophageal reflux disease without esophagitis: Secondary | ICD-10-CM | POA: Diagnosis not present

## 2022-12-04 DIAGNOSIS — E559 Vitamin D deficiency, unspecified: Secondary | ICD-10-CM | POA: Diagnosis not present

## 2022-12-04 DIAGNOSIS — Z9884 Bariatric surgery status: Secondary | ICD-10-CM

## 2022-12-04 LAB — TSH: TSH: 0.95 u[IU]/mL (ref 0.35–5.50)

## 2022-12-04 LAB — POCT GLYCOSYLATED HEMOGLOBIN (HGB A1C): Hemoglobin A1C: 5.7 % — AB (ref 4.0–5.6)

## 2022-12-04 LAB — COMPREHENSIVE METABOLIC PANEL
ALT: 9 U/L (ref 0–35)
AST: 12 U/L (ref 0–37)
Albumin: 3.9 g/dL (ref 3.5–5.2)
Alkaline Phosphatase: 70 U/L (ref 39–117)
BUN: 21 mg/dL (ref 6–23)
CO2: 26 mEq/L (ref 19–32)
Calcium: 8.8 mg/dL (ref 8.4–10.5)
Chloride: 104 mEq/L (ref 96–112)
Creatinine, Ser: 0.73 mg/dL (ref 0.40–1.20)
GFR: 90.02 mL/min (ref >60.00)
Glucose, Bld: 99 mg/dL (ref 70–99)
Potassium: 4.1 mEq/L (ref 3.5–5.1)
Sodium: 139 mEq/L (ref 135–145)
Total Bilirubin: 0.4 mg/dL (ref 0.2–1.2)
Total Protein: 6.2 g/dL (ref 6.0–8.3)

## 2022-12-04 LAB — FOLATE: Folate: 12.7 ng/mL (ref >5.9)

## 2022-12-04 LAB — CBC
HCT: 37.8 % (ref 36.0–46.0)
Hemoglobin: 12.5 g/dL (ref 12.0–15.0)
MCHC: 33.1 g/dL (ref 30.0–36.0)
MCV: 93.1 fl (ref 78.0–100.0)
Platelets: 246 10*3/uL (ref 150.0–400.0)
RBC: 4.05 Mil/uL (ref 3.87–5.11)
RDW: 12.3 % (ref 11.5–15.5)
WBC: 5.6 10*3/uL (ref 4.0–10.5)

## 2022-12-04 LAB — IBC + FERRITIN
Ferritin: 16.5 ng/mL (ref 10.0–291.0)
Iron: 93 ug/dL (ref 42–145)
Saturation Ratios: 22.2 % (ref 20.0–50.0)
TIBC: 418.6 ug/dL (ref 250.0–450.0)
Transferrin: 299 mg/dL (ref 212.0–360.0)

## 2022-12-04 LAB — VITAMIN D 25 HYDROXY (VIT D DEFICIENCY, FRACTURES): VITD: 28.81 ng/mL — ABNORMAL LOW (ref 30.00–100.00)

## 2022-12-04 LAB — VITAMIN B12: Vitamin B-12: 300 pg/mL (ref 211–911)

## 2022-12-04 MED ORDER — GABAPENTIN 300 MG PO CAPS
300.0000 mg | ORAL_CAPSULE | Freq: Two times a day (BID) | ORAL | 1 refills | Status: DC
Start: 2022-12-04 — End: 2023-06-12

## 2022-12-04 NOTE — Assessment & Plan Note (Signed)
Controlled.  Remain off lisinopril 30 mg for now. She will continue to monitor BP at home and report if readings start to escalate.

## 2022-12-04 NOTE — Assessment & Plan Note (Signed)
Labs pending for upcoming bariatric surgeon visit.

## 2022-12-04 NOTE — Assessment & Plan Note (Signed)
Resume gabapentin 300 mg twice daily. Refills provided today.

## 2022-12-04 NOTE — Assessment & Plan Note (Signed)
Unclear etiology, although it does seem that some of her medications are causing the symptoms.  Checking labs today to rule out H. Pylori, alpha gal allergy.  She will also discuss the symptoms with her bariatric surgeon. Resume pantoprazole 40 mg daily once H. pylori testing is complete.

## 2022-12-04 NOTE — Patient Instructions (Addendum)
Stop by the lab prior to leaving today. I will notify you of your results once received.   Resume pantoprazole 40 mg daily for your symptoms.  Schedule your physical for March 2025.  It was a pleasure to see you today!

## 2022-12-04 NOTE — Addendum Note (Signed)
Addended by: Doreene Nest on: 12/04/2022 12:11 PM   Modules accepted: Orders

## 2022-12-04 NOTE — Assessment & Plan Note (Signed)
Question if her nausea is secondary to untreated GERD or secondary to bariatric surgery.  Checking for H. pylori today. Resume pantoprazole 40 mg daily once testing is complete.  Follow-up with bariatric surgeon as scheduled.

## 2022-12-04 NOTE — Progress Notes (Addendum)
Subjective:    Patient ID: Brandy [name], female    DOB: [birth year], 57 y.o.   MRN: [id number]  Diabetes   Brandy [name] is a very pleasant 57 y.o. female with a history of hypertension, CHF, type 2 diabetes, chronic back pain, hyperlipidemia, obesity status post gastric bypass surgery, lower extremity weakness who presents today for follow-up of diabetes and to discuss nausea. She is also requesting lab work to prepare for her upcoming bariatric appointment.  1) Nausea: Over the last 2-3 months she's experienced moderate to severe nausea, epigastric discomfort without pain that occurs within 20 minutes of taking any medications. Symptoms are constant on a dull level, but will have intermittent waves of moderate to severe nausea.   To help improve her symptoms she's switched morning and night meds around, she's stopped taking certain medications, she's tried eating with meds, stopped taking vitamins, and nothing helps. She's been taking Pepto Bismol, drinking milk, and Zofran which helps.   She resumed Lamictal at 200 mg, Cymbalta 60 mg, citalopram 40 mg two days ago.   She's not taken lisinopril regularly in months. She's checking BP at home which is running 110's/70's-80's.   She's been taking gabapentin intermittently twice daily, would like to resume consistently.   She's not taken pantoprazole in months.   She denies recent tick bites, changes in her bowels, vomiting, nausea with eating. She does eat beef.   2) Type 2 Diabetes:   Current medications include: None  She is checking her blood glucose 1 times daily and is getting readings of:  AM fasting low 100s  Last A1C: 6.0 in March 2024, 5.7 today Last Eye Exam: Due Last Foot Exam: Due Pneumonia Vaccination: 2020 Urine Microalbumin: Up-to-date Statin: Atorvastatin  Dietary changes since last visit: Decreased appetite.    Exercise: None  Wt Readings from Last 3 Encounters:  12/04/22 250 lb (113.4 kg)  11/22/22  250 lb 6 oz (113.6 kg)  09/05/22 246 lb 3.2 oz (111.7 kg)    BP Readings from Last 3 Encounters:  12/04/22 124/62  11/22/22 100/60  09/05/22 130/70      Review of Systems  Constitutional:  Negative for fever.  Gastrointestinal:  Positive for nausea. Negative for abdominal pain, blood in stool, diarrhea and vomiting.  Musculoskeletal:  Positive for arthralgias and back pain.         Past Medical History:  Diagnosis Date  . Anxiety   . Arthritis   . Back pain   . Brain fog   . CHF (congestive heart failure) (HCC)    pt not aware of this  . Depression   . Diabetes mellitus without complication (HCC)   . Difficulty walking   . Dysmetabolic syndrome X   . Fatigue   . Fibromyalgia    neuropathy  . Hypertension   . Hypertriglyceridemia   . Joint pain   . Leg weakness   . Lower extremity edema   . Muscle atrophy   . Neuropathy   . Obesity   . Osteoarthritis   . PONV (postoperative nausea and vomiting)   . Poor memory   . Post-menopausal bleeding   . Prediabetes   . Rosacea   . Sleep apnea    not on cpap yet  . Tetanus vaccine causing adverse effect in therapeutic use   . Vitamin D deficiency     Social History   Socioeconomic History  . Marital status: Married    Spouse name: Ed  . Number of  children: 3  . Years of education: Not on file  . Highest education level: Associate degree: academic program  Occupational History  . Occupation: Immunologist: [employer]  Tobacco Use  . Smoking status: Never  . Smokeless tobacco: Never  Vaping Use  . Vaping status: Never Used  Substance and Sexual Activity  . Alcohol use: Not Currently    Comment: rarely - once a year  . Drug use: No  . Sexual activity: Not Currently    Birth control/protection: Post-menopausal  Other Topics Concern  . Not on file  Social History Narrative   Best boy      Married; 3 kids (middle child-severely autistic)      No  regular exercise   Social Determinants of Health   Financial Resource Strain: Medium Risk (12/04/2022)   Overall Financial Resource Strain (CARDIA)   . Difficulty of Paying Living Expenses: Somewhat hard  Food Insecurity: Patient Declined (12/04/2022)   Hunger Vital Sign   . Worried About Programme researcher, broadcasting/film/video in the Last Year: Patient declined   . Ran Out of Food in the Last Year: Patient declined  Transportation Needs: No Transportation Needs (12/04/2022)   PRAPARE - Transportation   . Lack of Transportation (Medical): No   . Lack of Transportation (Non-Medical): No  Physical Activity: Insufficiently Active (12/04/2022)   Exercise Vital Sign   . Days of Exercise per Week: 1 day   . Minutes of Exercise per Session: 60 min  Stress: Stress Concern Present (12/04/2022)   [employer] of Occupational Health - Occupational Stress Questionnaire   . Feeling of Stress : Rather much  Social Connections: Unknown (12/04/2022)   Social Connection and Isolation Panel [NHANES]   . Frequency of Communication with Friends and Family: More than three times a week   . Frequency of Social Gatherings with Friends and Family: Never   . Attends Religious Services: Patient declined   . Active Member of Clubs or Organizations: Yes   . Attends Banker Meetings: More than 4 times per year   . Marital Status: Married  Catering manager Violence: Not on file    Past Surgical History:  Procedure Laterality Date  . 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 DILATATION & CURETTAGE/HYSTEROSCOPY WITH MYOSURE N/A 10/07/2019   Procedure: DILATATION AND CURETTAGE /HYSTEROSCOPY, Myosure, Polypectomy;  Surgeon: Oriental Bing, MD;  Location: [facility] OR;  Service: Gynecology;  Laterality: N/A;  . ENDOMETRIAL BIOPSY  07/14/2019      . epidural steroid injection    . 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   Procedure: LAPAROSCOPIC ROUX-EN-Y GASTRIC BYPASS WITH UPPER ENDOSCOPY;  Surgeon: Kinsinger, De Blanch, MD;  Location:  [facility];  Service: General;  Laterality: N/A;    Family History  Problem Relation Age of Onset  . Rheum arthritis Mother   . COPD Mother   . Stroke Mother   . Depression Mother   . Anxiety disorder Mother   . Eating disorder Mother   . Obesity Mother   . Hypertension Father   . Depression Father   . Alcoholism Father   . Heart disease Maternal Grandmother   . Stroke Maternal Grandmother   . Alcohol abuse Maternal Grandmother   . Alcohol abuse Maternal Grandfather   . Aneurysm Paternal Grandmother   . Autism Son        severe  . Diabetes Paternal Aunt   . Breast cancer Neg Hx  Allergies  Allergen Reactions  . Diphenhydramine Hcl Rash    minor rash  . Penicillins Rash    Current Outpatient Medications on File Prior to Visit  Medication Sig Dispense Refill  . acetaminophen (TYLENOL) 650 MG CR tablet Take 1,300 mg by mouth every 8 (eight) hours as needed for pain.    Marland Kitchen atorvastatin (LIPITOR) 10 MG tablet TAKE 1 TABLET BY MOUTH EVERY DAY FOR CHOLESTEROL 90 tablet 2  . Blood Glucose Monitoring Suppl (ONETOUCH VERIO REFLECT) w/Device KIT 3 (three) times daily.    . Blood Glucose Monitoring Suppl DEVI 1 each by Does not apply route in the morning, at noon, and at bedtime. May substitute to any manufacturer covered by patient's insurance. 1 each 0  . cetirizine (ZYRTEC) 10 MG tablet TAKE 1 TABLET (10 MG TOTAL) BY MOUTH DAILY. FOR ALLERGIES 90 tablet 1  . citalopram (CELEXA) 40 MG tablet Take 40 mg by mouth daily.    . DULoxetine (CYMBALTA) 30 MG capsule Take 30 mg by mouth daily. Take with 60 mg to equal 90 mg daily    . DULoxetine (CYMBALTA) 60 MG capsule Take 60 mg by mouth daily. Take with 30 mg to equal 90 mg daily    . folic acid (FOLVITE) 1 MG tablet Take 1 mg by mouth daily.    Marland Kitchen glucose blood (ONETOUCH VERIO) test strip TEST BLOOD SUGAR MORNING, NOON AND AT BEDTIME 100 strip 3  . lamoTRIgine (LAMICTAL) 100 MG tablet Take 100 mg by mouth every evening.    . lamoTRIgine  (LAMICTAL) 200 MG tablet Take 200 mg by mouth in the morning.    . metroNIDAZOLE (METROCREAM) 0.75 % cream Apply 1 application topically daily as needed (rosacea).    . Multiple Minerals-Vitamins (CALCIUM-MAGNESIUM-ZINC-D3) TABS Take 2 tablets by mouth daily.    . ondansetron (ZOFRAN-ODT) 4 MG disintegrating tablet Dissolve 1 tablet (4 mg total) by mouth every 6 (six) hours as needed for nausea or vomiting. 20 tablet 0  . pantoprazole (PROTONIX) 40 MG tablet Take 1 tablet (40 mg total) by mouth daily. 90 tablet 0  . Vitamin D, Ergocalciferol, (DRISDOL) 1.25 MG (50000 UNIT) CAPS capsule TAKE 1 CAPSULE BY MOUTH ONCE WEEKLY FOR VITAMIN D. 12 capsule 1   No current facility-administered medications on file prior to visit.    BP 124/62   Pulse 74   Temp 97.6 F (36.4 C) (Temporal)   Ht 5' 7.5" (1.715 m)   Wt 250 lb (113.4 kg)   LMP  (LMP Unknown)   SpO2 100%   BMI 38.58 kg/m  Objective:   Physical Exam Cardiovascular:     Rate and Rhythm: Normal rate and regular rhythm.  Pulmonary:     Effort: Pulmonary effort is normal.     Breath sounds: Normal breath sounds.  Musculoskeletal:     Cervical back: Neck supple.  Skin:    General: Skin is warm and dry.  Neurological:     Mental Status: She is alert.  Psychiatric:        Mood and Affect: Mood normal.          Assessment & Plan:  Type 2 diabetes mellitus with hyperglycemia, without long-term current use of insulin (HCC) Assessment & Plan: Improved and well-controlled with A1c of 5.7 today!  Remain off treatment.  Follow-up in 6 months.  Orders: -     POCT glycosylated hemoglobin (Hb A1C) -     CBC -     TSH -  Comprehensive metabolic panel  Chronic bilateral low back pain with left-sided sciatica Assessment & Plan: Resume gabapentin 300 mg twice daily. Refills provided today.  Orders: -     Gabapentin; Take 1 capsule (300 mg total) by mouth 2 (two) times daily. For back pain  Dispense: 180 capsule; Refill:  1  Arthralgia of multiple sites -     Gabapentin; Take 1 capsule (300 mg total) by mouth 2 (two) times daily. For back pain  Dispense: 180 capsule; Refill: 1  Vitamin B12 deficiency -     Vitamin B12  Vitamin D deficiency -     VITAMIN D 25 Hydroxy (Vit-D Deficiency, Fractures)  S/P gastric bypass Assessment & Plan: Labs pending for upcoming bariatric surgeon visit.  Orders: -     Vitamin B1 -     IBC + Ferritin -     TSH -     Comprehensive metabolic panel -     Folate  Nausea without vomiting Assessment & Plan: Unclear etiology, although it does seem that some of her medications are causing the symptoms.  Checking labs today to rule out H. Pylori, alpha gal allergy.  She will also discuss the symptoms with her bariatric surgeon. Resume pantoprazole 40 mg daily once H. pylori testing is complete.  Orders: -     Alpha-Gal Panel -     H. pylori breath test  Essential hypertension, benign Assessment & Plan: Controlled.  Remain off lisinopril 30 mg for now. She will continue to monitor BP at home and report if readings start to escalate.    Gastroesophageal reflux disease, unspecified whether esophagitis present Assessment & Plan: Question if her nausea is secondary to untreated GERD or secondary to bariatric surgery.  Checking for H. pylori today. Resume pantoprazole 40 mg daily once testing is complete.  Follow-up with bariatric surgeon as scheduled.         Doreene Nest, NP

## 2022-12-04 NOTE — Assessment & Plan Note (Signed)
Improved and well-controlled with A1c of 5.7 today!  Remain off treatment.  Follow-up in 6 months.

## 2022-12-06 ENCOUNTER — Ambulatory Visit (INDEPENDENT_AMBULATORY_CARE_PROVIDER_SITE_OTHER): Payer: HMO | Admitting: Psychology

## 2022-12-06 DIAGNOSIS — F339 Major depressive disorder, recurrent, unspecified: Secondary | ICD-10-CM

## 2022-12-06 NOTE — Progress Notes (Signed)
[facility] Counselor/Therapist Progress Note  Patient ID: JAKARI KRUPPA, MRN: [id number]   Date: 12/06/22  Time Spent: 12:06 pm - 1:01 pm : 55 Minutes  Treatment Type: Individual Therapy.  Reported Symptoms: depression and anxiety.  Mental Status Exam: Appearance:  Casual     Behavior: Appropriate  Motor: Normal  Speech/Language:  Clear and Coherent  Affect: Congruent and Tearful  Mood: dysthymic  Thought process: normal  Thought content:   WNL  Sensory/Perceptual disturbances:   WNL  Orientation: oriented to person, place, time/date, and situation  Attention: Good  Concentration: Good  Memory: WNL  Fund of knowledge:  Good  Insight:   Good  Judgment:  Good  Impulse Control: Good   Risk Assessment: Danger to Self:  No Self-injurious Behavior: No Danger to Others: No Duty to Warn:no Physical Aggression / Violence:No  Access to Firearms a concern: No  Gang Involvement:No   Subjective:   Brandy [name] participated from car, via video, is aware of limitations, and consented to treatment. Therapist participated from home office. Brandy [name] reviewed the events of the past week. She noted frustration regarding her GI issues that affect her day-to-day and discussed this with her medical provider this week. She noted marked improvement in her health, overall and noted her labs looking good, overall. We continued to process her need to be needed and how doing things for others so receive positive feedback. She noted "getting this" from her father. She noted the differences between her and her best friend in relation to politics and general life approaches. Brandy [name] noted a need of purpose in her day-to-day life and the lack their of, at this time. Therapist highlighted Brandy [name]'s investment in others and less into herself and we worked on processing how this might affect her sense of purpose. We will continue to process this during the session. She noted difficulty having needs  addressed with husband and noted her attempts to communicate said needs consistently. We explored this during the session and therapist encouraged Brandy [name] to engage in identifying possible barriers in this area. Therapist validated and normalized Brandy [name]'s feelings and experiences and provided supportive therapy.  Interventions:  CBT & interpersonal.   Diagnosis:  Episode of recurrent major depressive disorder, unspecified depression episode severity [facility])  Psychiatric Treatment: Yes , Brandy Section, Brandy [name].     Treatment Plan:  [name], self-aware, and motivated for change.   Support System: Family and friends.    Client Treatment Preferences Outpatient therapy.   Client Statement of Needs Brandy [name] would like to focus on self-care, eating healthfully, engaging in consistent exercise, focusing on sense of self,  being more organized and goal oriented, process past events including childhood, reduce negative self-talk.   Treatment Level Weekly  Symptoms  Depression: loss of interest, feeling down, poor sleep, lethargy, fluctuating appetite, feeling bad about self, and trouble concentrating.    (Status: maintained) Anxiety:  feeling anxious, difficulty managing worry, worrying about different things, trouble relaxing, restlessness, and feeling afraid as if something awful might happen.   (Status: maintained)  Goals:   Shaterria experiences symptoms of depression and anxiety.    Target Date: 07/19/23 Frequency: Weekly  Progress: 0 Modality: individual    Therapist will provide referrals for additional resources as appropriate.  Therapist will provide psycho-education regarding Brandy [name]'s diagnosis and corresponding treatment approaches and interventions. Licensed Clinical Social Worker, Champ, LCSW will support the patient's ability to achieve the goals identified. will employ CBT, BA, Problem-solving, Solution Focused,  Mindfulness,  coping skills, & other  evidenced-based practices will be used to promote progress towards healthy functioning to help manage decrease symptoms associated with her diagnosis.   Reduce overall level, frequency, and intensity of the feelings of depression & anxiety and evidenced by decreased overall symptoms  from 6 to 7 days/week to 0 to 1 days/week per client report for at least 3 consecutive months. Verbally express understanding of the relationship between feelings of depression, anxiety and their impact on thinking patterns and behaviors. Verbalize an understanding of the role that distorted thinking plays in creating fears, excessive worry, and ruminations.    Brandy [name] participated in the creation of the treatment plan)  [name], LCSW

## 2022-12-12 DIAGNOSIS — R41840 Attention and concentration deficit: Secondary | ICD-10-CM | POA: Diagnosis not present

## 2022-12-12 DIAGNOSIS — F3181 Bipolar II disorder: Secondary | ICD-10-CM | POA: Diagnosis not present

## 2022-12-12 DIAGNOSIS — F411 Generalized anxiety disorder: Secondary | ICD-10-CM | POA: Diagnosis not present

## 2022-12-12 DIAGNOSIS — F5105 Insomnia due to other mental disorder: Secondary | ICD-10-CM | POA: Diagnosis not present

## 2022-12-13 ENCOUNTER — Ambulatory Visit: Payer: HMO | Admitting: Psychology

## 2022-12-13 DIAGNOSIS — F339 Major depressive disorder, recurrent, unspecified: Secondary | ICD-10-CM | POA: Diagnosis not present

## 2022-12-13 NOTE — Progress Notes (Signed)
[facility] Counselor/Therapist Progress Note  Patient ID: Brandy [name], MRN: [id number]   Date: 12/13/22  Time Spent: 12:06 pm - 1:00 pm : 54  Minutes  Treatment Type: Individual Therapy.  Reported Symptoms: depression and anxiety.  Mental Status Exam: Appearance:  Casual     Behavior: Appropriate  Motor: Normal  Speech/Language:  Clear and Coherent  Affect: Congruent and Tearful  Mood: dysthymic  Thought process: normal  Thought content:   WNL  Sensory/Perceptual disturbances:   WNL  Orientation: oriented to person, place, time/date, and situation  Attention: Good  Concentration: Good  Memory: WNL  Fund of knowledge:  Good  Insight:   Good  Judgment:  Good  Impulse Control: Good   Risk Assessment: Danger to Self:  No Self-injurious Behavior: No Danger to Others: No Duty to Warn:no Physical Aggression / Violence:No  Access to Firearms a concern: No  Gang Involvement:No   Subjective:   Athleen Hugunin Knotts participated from car, via video, is aware of limitations, and consented to treatment. Therapist participated from home office. Shalice reviewed the events of the past week. She noted difficulty with managing her own feelings regarding other people's choices and general decision-making. We worked on processing this during the session and discussed this in relation to her daughter's decision-making. We worked on identifying areas of control and lack of control and discussed ways to manage her distress going forward. She met with her provider, Dr. Maryruth Bun, and noted that her provider highlighted that Yong was "hypo-manic" during the meeting. She noted medication change due to difficulty taking her medication due to GI issues. She noted receiving equivalent medication in liquid or disintegrating tablets. She was recommended to have a follow-up sooner than scheduled with her surgeon regarding GI issues. We continued to process her needs and the effects of these on her  decision-making including impulsive decision-making. Psycho-education was provided regarding impulsivity. We worked on processing her decision-making process and ways to manage the distress of her drive to have her needs met and discussed the importance of adaptive decision-making. We will continue to process this going forward. Yasmyn was engaged and motivated during the session and expressed commitment towards the session goals. Therapist validated Shadana's feeling and experience and provided supportive therapy.   Interventions:  CBT & interpersonal.   Diagnosis:  Episode of recurrent major depressive disorder, unspecified depression episode severity [facility])  Psychiatric Treatment: Yes , [name], MD.     Treatment Plan:  Client Abilities/Strengths Larose is intelligent, self-aware, and motivated for change.   Support System: Family and friends.    Client Treatment Preferences Outpatient therapy.   Client Statement of Needs Yeraldi would like to focus on self-care, eating healthfully, engaging in consistent exercise, focusing on sense of self,  being more organized and goal oriented, process past events including childhood, reduce negative self-talk.   Treatment Level Weekly  Symptoms  Depression: loss of interest, feeling down, poor sleep, lethargy, fluctuating appetite, feeling bad about self, and trouble concentrating.    (Status: maintained) Anxiety:  feeling anxious, difficulty managing worry, worrying about different things, trouble relaxing, restlessness, and feeling afraid as if something awful might happen.   (Status: maintained)  Goals:   Tiaja experiences symptoms of depression and anxiety.    Target Date: 07/19/23 Frequency: Weekly  Progress: 0 Modality: individual    Therapist will provide referrals for additional resources as appropriate.  Therapist will provide psycho-education regarding Marian's diagnosis and corresponding treatment approaches and  interventions. Licensed Clinical Social  Worker, [name], LCSW will support the patient's ability to achieve the goals identified. will employ CBT, BA, Problem-solving, Solution Focused, Mindfulness,  coping skills, & other evidenced-based practices will be used to promote progress towards healthy functioning to help manage decrease symptoms associated with her diagnosis.   Reduce overall level, frequency, and intensity of the feelings of depression & anxiety and evidenced by decreased overall symptoms  from 6 to 7 days/week to 0 to 1 days/week per client report for at least 3 consecutive months. Verbally express understanding of the relationship between feelings of depression, anxiety and their impact on thinking patterns and behaviors. Verbalize an understanding of the role that distorted thinking plays in creating fears, excessive worry, and ruminations.    [name] participated in the creation of the treatment plan)  [name], LCSW

## 2022-12-20 ENCOUNTER — Ambulatory Visit (INDEPENDENT_AMBULATORY_CARE_PROVIDER_SITE_OTHER): Payer: HMO | Admitting: Psychology

## 2022-12-20 DIAGNOSIS — F339 Major depressive disorder, recurrent, unspecified: Secondary | ICD-10-CM

## 2022-12-20 NOTE — Progress Notes (Signed)
[facility] Counselor/Therapist Progress Note  Patient ID: SHARLETTA BOLAN, MRN: [id number]   Date: 12/20/22  Time Spent: 12:04 pm - 1:02 pm :58 Minutes  Treatment Type: Individual Therapy.  Reported Symptoms: depression and anxiety.  Mental Status Exam: Appearance:  Casual     Behavior: Appropriate  Motor: Normal  Speech/Language:  Clear and Coherent  Affect: Congruent and Tearful  Mood: dysthymic  Thought process: normal  Thought content:   WNL  Sensory/Perceptual disturbances:   WNL  Orientation: oriented to person, place, time/date, and situation  Attention: Good  Concentration: Good  Memory: WNL  Fund of knowledge:  Good  Insight:   Good  Judgment:  Good  Impulse Control: Good   Risk Assessment: Danger to Self:  No Self-injurious Behavior: No Danger to Others: No Duty to Warn:no Physical Aggression / Violence:No  Access to Firearms a concern: No  Gang Involvement:No   Subjective:   Brandy [name] participated from car, via video, is aware of limitations, and consented to treatment. Therapist participated from home office. Brandy [name] reviewed the events of the past week. Brandy [name] noted engaging in introspection about her feelings of self-worth and what makes her feel worthy. She noted interest in refocusing on her faith. She noted frustration regarding her husband not paying attention as they have discussions. She identified feelings of anger regarding this and noted this is a repeated issue. She noted that she thinks "it's not important to him". We worked on exploring this during the session and her attempts to address this. She noted a recent discussion with her best friend [name] and noted the realization that she isn't "a priority" in the way she hoped to be. She noted making "positive headway" in relation to self-care. She noted making a list of activities that she wants to continue investing in including self-care & household management. She noted experiencing  financial stressors and this is limiting her ability to engage. We worked on feeling identification during the session. Therapist highlighted Brandy [name] generalizing during the session and worked on identifying ways to challenge this via evidence. She discussed increasing frustration with her husband regarding feeling unprioritized in relation to needs and wants. Therapist highlighted the lack of follow through, problem-solving, and mindfulness of issues. Therapist encouraged Brandy [name] to identify ways to communicate a need for empathy, effort, and a proactive approach from her husband. Therapist modeled this during the session. Brandy [name] was engaged and motivated during the session. She expressed commitment towards our goals. Therapist praised Elira and provided supportive therapy.  Interventions:  CBT & interpersonal.   Diagnosis:  Episode of recurrent major depressive disorder, unspecified depression episode severity [facility])  Psychiatric Treatment: Yes , [name], MD.     Treatment Plan:  [name], self-aware, and motivated for change.   Support System: Family and friends.    Client Treatment Preferences Outpatient therapy.   Client Statement of Needs Brandy [name] would like to focus on self-care, eating healthfully, engaging in consistent exercise, focusing on sense of self,  being more organized and goal oriented, process past events including childhood, reduce negative self-talk.   Treatment Level Weekly  Symptoms  Depression: loss of interest, feeling down, poor sleep, lethargy, fluctuating appetite, feeling bad about self, and trouble concentrating.    (Status: maintained) Anxiety:  feeling anxious, difficulty managing worry, worrying about different things, trouble relaxing, restlessness, and feeling afraid as if something awful might happen.   (Status: maintained)  Goals:   Brandy [name] experiences symptoms of depression and  anxiety.    Target Date: 07/19/23  Frequency: Weekly  Progress: 0 Modality: individual    Therapist will provide referrals for additional resources as appropriate.  Therapist will provide psycho-education regarding Damariz's diagnosis and corresponding treatment approaches and interventions. Licensed Clinical Social Worker, [place], LCSW will support the patient's ability to achieve the goals identified. will employ CBT, BA, Problem-solving, Solution Focused, Mindfulness,  coping skills, & other evidenced-based practices will be used to promote progress towards healthy functioning to help manage decrease symptoms associated with her diagnosis.   Reduce overall level, frequency, and intensity of the feelings of depression & anxiety and evidenced by decreased overall symptoms  from 6 to 7 days/week to 0 to 1 days/week per client report for at least 3 consecutive months. Verbally express understanding of the relationship between feelings of depression, anxiety and their impact on thinking patterns and behaviors. Verbalize an understanding of the role that distorted thinking plays in creating fears, excessive worry, and ruminations.    Brandy [name] participated in the creation of the treatment plan)  [name], LCSW

## 2022-12-27 ENCOUNTER — Ambulatory Visit: Payer: HMO | Admitting: Psychology

## 2022-12-27 DIAGNOSIS — F339 Major depressive disorder, recurrent, unspecified: Secondary | ICD-10-CM | POA: Diagnosis not present

## 2022-12-27 NOTE — Progress Notes (Signed)
[facility] Counselor/Therapist Progress Note  Patient ID: Brandy [name], MRN: [id number]   Date: 12/27/22  Time Spent: 12:09 pm - 1:03 pm : 54  Minutes  Treatment Type: Individual Therapy.  Reported Symptoms: depression and anxiety.  Mental Status Exam: Appearance:  Casual     Behavior: Appropriate  Motor: Normal  Speech/Language:  Clear and Coherent  Affect: Congruent and Tearful  Mood: dysthymic  Thought process: normal  Thought content:   WNL  Sensory/Perceptual disturbances:   WNL  Orientation: oriented to person, place, time/date, and situation  Attention: Good  Concentration: Good  Memory: WNL  Fund of knowledge:  Good  Insight:   Good  Judgment:  Good  Impulse Control: Good   Risk Assessment: Danger to Self:  No Self-injurious Behavior: No Danger to Others: No Duty to Warn:no Physical Aggression / Violence:No  Access to Firearms a concern: No  Gang Involvement:No   Subjective:   Brandy [name] participated from car, via video, is aware of limitations, and consented to treatment. Therapist participated from home office. Brandy [name] reviewed the events of the past week. She noted familial stressors due to her daughter moving and this being poor planned and creating significant stress for the family as a whole. She noted her attempts to communicate concerns and noted a history of her daughter not receiving any feedback well. We worked on identifying possible boundaries for self and process her feelings regarding these stressors. She noted the recent and sudden loss of her eldest surviving aunt and we worked on processing this loss. She noted frustration and sadness regarding her children's lack of investment in family and lack of checking in on her and her husband. We worked on processing her feelings and Brandy [name] noted having a serious conversation with her husband regarding needs, being heard, and focusing on their conversations. She noted frustration regarding  Brandy [name]'s lack of prioritization of her needs and his immediate prioritization of his own needs. We explored this during the session and worked on feeling identification. We discussed ways to communicate needs and to set boundaries. Therapist modeled this during the session. Brandy [name] was engaged and motivated and expressed commitment towards goals. Therapist praised Brandy [name] for her effort and vulnerability and provided supportive therapy.   Interventions:  CBT & interpersonal.   Diagnosis:  Episode of recurrent major depressive disorder, unspecified depression episode severity [facility])  Psychiatric Treatment: Yes , [name], MD.     Treatment Plan:  Client Abilities/Strengths Katiana is intelligent, self-aware, and motivated for change.   Support System: Family and friends.    Client Treatment Preferences Outpatient therapy.   Client Statement of Needs Kirrah would like to focus on self-care, eating healthfully, engaging in consistent exercise, focusing on sense of self,  being more organized and goal oriented, process past events including childhood, reduce negative self-talk.   Treatment Level Weekly  Symptoms  Depression: loss of interest, feeling down, poor sleep, lethargy, fluctuating appetite, feeling bad about self, and trouble concentrating.    (Status: maintained) Anxiety:  feeling anxious, difficulty managing worry, worrying about different things, trouble relaxing, restlessness, and feeling afraid as if something awful might happen.   (Status: maintained)  Goals:   [name] experiences symptoms of depression and anxiety.    Target Date: 07/19/23 Frequency: Weekly  Progress: 0 Modality: individual    Therapist will provide referrals for additional resources as appropriate.  Therapist will provide psycho-education regarding Chrysa's diagnosis and corresponding treatment approaches and interventions. Licensed Clinical Social Worker, [place],  LCSW will support the patient's ability to  achieve the goals identified. will employ CBT, BA, Problem-solving, Solution Focused, Mindfulness,  coping skills, & other evidenced-based practices will be used to promote progress towards healthy functioning to help manage decrease symptoms associated with her diagnosis.   Reduce overall level, frequency, and intensity of the feelings of depression & anxiety and evidenced by decreased overall symptoms  from 6 to 7 days/week to 0 to 1 days/week per client report for at least 3 consecutive months. Verbally express understanding of the relationship between feelings of depression, anxiety and their impact on thinking patterns and behaviors. Verbalize an understanding of the role that distorted thinking plays in creating fears, excessive worry, and ruminations.    Brandy [name] participated in the creation of the treatment plan)  [name], LCSW

## 2023-01-03 ENCOUNTER — Ambulatory Visit (INDEPENDENT_AMBULATORY_CARE_PROVIDER_SITE_OTHER): Payer: HMO | Admitting: Psychology

## 2023-01-03 DIAGNOSIS — F339 Major depressive disorder, recurrent, unspecified: Secondary | ICD-10-CM

## 2023-01-03 DIAGNOSIS — F3289 Other specified depressive episodes: Secondary | ICD-10-CM

## 2023-01-03 NOTE — Progress Notes (Signed)
[facility] Counselor/Therapist Progress Note  Patient ID: Brandy [name], MRN: [id number]   Date: 01/03/23  Time Spent: 12:06 pm - 1:00  pm : 54  Minutes  Treatment Type: Individual Therapy.  Reported Symptoms: depression and anxiety.  Mental Status Exam: Appearance:  Casual     Behavior: Appropriate  Motor: Normal  Speech/Language:  Clear and Coherent  Affect: Congruent and Tearful  Mood: dysthymic  Thought process: normal  Thought content:   WNL  Sensory/Perceptual disturbances:   WNL  Orientation: oriented to person, place, time/date, and situation  Attention: Good  Concentration: Good  Memory: WNL  Fund of knowledge:  Good  Insight:   Good  Judgment:  Good  Impulse Control: Good   Risk Assessment: Danger to Self:  No Self-injurious Behavior: No Danger to Others: No Duty to Warn:no Physical Aggression / Violence:No  Access to Firearms a concern: No  Gang Involvement:No   Subjective:   Brandy [name] participated from car, via video, is aware of limitations, and consented to treatment. Therapist participated from home office. Brandy [name] reviewed the events of the past week. She noted having a positive overall week. She noted her psychotropic medication causing GI distress. Therapist encouraged Brandy [name] to contact her prescriber to address this issue. Brandy [name] is slated to meet with her gastric surgeon for additional direction. Therapist provided psycho-education regarding the importance of taking her medication consistency and the risks of lack of adherence to the medication regimen. She noted frustration regarding her family's lack of planning, output, and general  She noted working on communicating her concerns regarding her grand-daughter to her mother  but noted this being ineffectual. We worked on processing this during the session. We worked on identifying areas of control and lack of control and mindful of her boundaries in regards to this while being supportive.  Brandy [name] was engaged and motivated and expressed commitment towards goals. Therapist validated Brandy [name]'s feelings and experience and provided supportive therapy.   Interventions:  CBT & interpersonal.   Diagnosis:  Episode of recurrent major depressive disorder, unspecified depression episode severity ([facility]CC)  Other depression  Psychiatric Treatment: Yes , Brandy Section, Brandy [name].     Treatment Plan:  Client Abilities/Strengths Navae is intelligent, self-aware, and motivated for change.   Support System: Family and friends.    Client Treatment Preferences Outpatient therapy.   Client Statement of Needs Keymoni would like to focus on self-care, eating healthfully, engaging in consistent exercise, focusing on sense of self,  being more organized and goal oriented, process past events including childhood, reduce negative self-talk.   Treatment Level Weekly  Symptoms  Depression: loss of interest, feeling down, poor sleep, lethargy, fluctuating appetite, feeling bad about self, and trouble concentrating.    (Status: maintained) Anxiety:  feeling anxious, difficulty managing worry, worrying about different things, trouble relaxing, restlessness, and feeling afraid as if something awful might happen.   (Status: maintained)  Goals:   Amillya experiences symptoms of depression and anxiety.    Target Date: 07/19/23 Frequency: Weekly  Progress: 0 Modality: individual    Therapist will provide referrals for additional resources as appropriate.  Therapist will provide psycho-education regarding Imagene's diagnosis and corresponding treatment approaches and interventions. Licensed Clinical Social Worker, [place], LCSW will support the patient's ability to achieve the goals identified. will employ CBT, BA, Problem-solving, Solution Focused, Mindfulness,  coping skills, & other evidenced-based practices will be used to promote progress towards healthy functioning to help manage decrease symptoms associated  with her diagnosis.  Reduce overall level, frequency, and intensity of the feelings of depression & anxiety and evidenced by decreased overall symptoms  from 6 to 7 days/week to 0 to 1 days/week per client report for at least 3 consecutive months. Verbally express understanding of the relationship between feelings of depression, anxiety and their impact on thinking patterns and behaviors. Verbalize an understanding of the role that distorted thinking plays in creating fears, excessive worry, and ruminations.    [name] participated in the creation of the treatment plan)  [name], LCSW

## 2023-01-10 ENCOUNTER — Ambulatory Visit (INDEPENDENT_AMBULATORY_CARE_PROVIDER_SITE_OTHER): Payer: HMO | Admitting: Psychology

## 2023-01-10 DIAGNOSIS — F339 Major depressive disorder, recurrent, unspecified: Secondary | ICD-10-CM | POA: Diagnosis not present

## 2023-01-10 DIAGNOSIS — Z9884 Bariatric surgery status: Secondary | ICD-10-CM | POA: Diagnosis not present

## 2023-01-10 NOTE — Progress Notes (Signed)
[facility] Counselor/Therapist Progress Note  Patient ID: Brandy [name], MRN: [id number]   Date: 01/10/23  Time Spent: 12:05 pm - 1:02 pm : 57 Minutes  Treatment Type: Individual Therapy.  Reported Symptoms: depression and anxiety.  Mental Status Exam: Appearance:  Casual     Behavior: Appropriate  Motor: Normal  Speech/Language:  Clear and Coherent  Affect: Congruent and Tearful  Mood: dysthymic  Thought process: normal  Thought content:   WNL  Sensory/Perceptual disturbances:   WNL  Orientation: oriented to person, place, time/date, and situation  Attention: Good  Concentration: Good  Memory: WNL  Fund of knowledge:  Good  Insight:   Good  Judgment:  Good  Impulse Control: Good   Risk Assessment: Danger to Self:  No Self-injurious Behavior: No Danger to Others: No Duty to Warn:no Physical Aggression / Violence:No  Access to Firearms a concern: No  Gang Involvement:No   Subjective:   Brandy [name] participated from car, via video, is aware of limitations, and consented to treatment. Therapist participated from home office. Brandy [name] reviewed the events of the past week. She noted a recent follow-up with her bariatric surgeon to work on addressing her GI issues. She noted worry about her son, Brandy [name], who is non-verbal in regards to getting compliance at the doctor's office for labs. She noted identifying a way to get these labs done and noted this going well overall. She noted this processing being stressful and requiring patience. She noted her stressor during this time and her efforts to manage this and get her testing to be done. She noted this process being a high risk but noted it going well. She noted the stressors of the follow-up steps to continue addressing his health. She note her son's significant reluctance to adhere to medical labs, tests, and blood draws. She noted having a good today, overall, as a result of this overall positive experience. She  noted "biting off more than she can chew" in relation to what she is doing at home and for family. She noted a need to modulate her output and be more mindful of her own needs and make decisions based on this. Therapist praised Brandy [name] for her efforts to be more mindful of her mood and needs and making adjustment. She noted working on identifying the source of her tearfulness which occurs, at times, unexpectedly. We will work on exploring this going forward. Brandy [name] was engaged and motivated during the session. She expressed commitment towards treatment goals. Therapist praised Brandy [name] and provided supportive therapy. We will process her feelings regarding her decision-making going forward. Therapist provided supportive thearpy.    Interventions:  CBT & interpersonal.   Diagnosis:  Episode of recurrent major depressive disorder, unspecified depression episode severity [facility])  Psychiatric Treatment: Yes , Brandy Section, MD.     Treatment Plan:  [name], self-aware, and motivated for change.   Support System: Family and friends.    Client Treatment Preferences Outpatient therapy.   Client Statement of Needs Ahlina would like to focus on self-care, eating healthfully, engaging in consistent exercise, focusing on sense of self,  being more organized and goal oriented, process past events including childhood, reduce negative self-talk.   Treatment Level Weekly  Symptoms  Depression: loss of interest, feeling down, poor sleep, lethargy, fluctuating appetite, feeling bad about self, and trouble concentrating.    (Status: maintained) Anxiety:  feeling anxious, difficulty managing worry, worrying about different things, trouble relaxing, restlessness, and feeling afraid as if  something awful might happen.   (Status: maintained)  Goals:   Floris experiences symptoms of depression and anxiety.    Target Date: 07/19/23 Frequency: Weekly  Progress: 0 Modality:  individual    Therapist will provide referrals for additional resources as appropriate.  Therapist will provide psycho-education regarding Brandy [name]'s diagnosis and corresponding treatment approaches and interventions. Licensed Clinical Social Worker, [place], LCSW will support the patient's ability to achieve the goals identified. will employ CBT, BA, Problem-solving, Solution Focused, Mindfulness,  coping skills, & other evidenced-based practices will be used to promote progress towards healthy functioning to help manage decrease symptoms associated with her diagnosis.   Reduce overall level, frequency, and intensity of the feelings of depression & anxiety and evidenced by decreased overall symptoms  from 6 to 7 days/week to 0 to 1 days/week per client report for at least 3 consecutive months. Verbally express understanding of the relationship between feelings of depression, anxiety and their impact on thinking patterns and behaviors. Verbalize an understanding of the role that distorted thinking plays in creating fears, excessive worry, and ruminations.    Brandy [name] participated in the creation of the treatment plan)  [name], LCSW

## 2023-01-17 ENCOUNTER — Ambulatory Visit (INDEPENDENT_AMBULATORY_CARE_PROVIDER_SITE_OTHER): Payer: HMO | Admitting: Psychology

## 2023-01-17 DIAGNOSIS — F339 Major depressive disorder, recurrent, unspecified: Secondary | ICD-10-CM

## 2023-01-17 NOTE — Progress Notes (Signed)
[facility] Counselor/Therapist Progress Note  Patient ID: Brandy [name], MRN: [id number]   Date: 01/17/23  Time Spent: 12:05 pm - 1:01 pm : 56 Minutes  Treatment Type: Individual Therapy.  Reported Symptoms: depression and anxiety.  Mental Status Exam: Appearance:  Casual     Behavior: Appropriate  Motor: Normal  Speech/Language:  Clear and Coherent  Affect: Congruent and Tearful  Mood: dysthymic  Thought process: normal  Thought content:   WNL  Sensory/Perceptual disturbances:   WNL  Orientation: oriented to person, place, time/date, and situation  Attention: Good  Concentration: Good  Memory: WNL  Fund of knowledge:  Good  Insight:   Good  Judgment:  Good  Impulse Control: Good   Risk Assessment: Danger to Self:  No Self-injurious Behavior: No Danger to Others: No Duty to Warn:no Physical Aggression / Violence:No  Access to Firearms a concern: No  Gang Involvement:No   Subjective:   Brandy [name] participated from car, via video, is aware of limitations, and consented to treatment. Therapist participated from home office. Brandy [name] reviewed the events of the past week. She noted her efforts to address financial stress of her family's medication. She noted a recent disagreement with her partner, Ed, regarding his refusal to get testing done due to his discomfort related to the method of testing. She noted her frustration regarding this and noted her attempts to set boundaries and be assertive regarding this. Brandy [name] noted her approach when frustrated, being "loud and swearing", which she noted Ed having an objection to. Brandy [name] noted this being her attempts to be heard, understood, and empathized with. We worked on the pros and cons of this approach and how to consider Ed's feedback regarding these communication style. We worked on identifying more adaptive communication styles and approaches, which were modeled during the session. Therapist reviewed fair fighting  rules during the session and encouraged Brandy [name] to review this between sessions. She noted her efforts to make positive changes, change her routine, and challenge herself. She noted Ed's reluctance to engage in new behaviors, to challenge self, and to focus on building a vibrant routine. We will work on processing this going forward. Therapist validated Brandy [name]'s feelings and praised Brandy [name] for being open to shifting her communication style during stressful time. Brandy [name] expressed commitment to this goal and will review fair fighting rules. Therapist provided supportive therapy. We will also work on processing her self-talk regarding the difficulty of managing interpersonal relationships going forward.   Interventions:  CBT & interpersonal.   Diagnosis:  Episode of recurrent major depressive disorder, unspecified depression episode severity [facility])  Psychiatric Treatment: Yes , [name], MD.     Treatment Plan:  [name], self-aware, and motivated for change.   Support System: Family and friends.    Client Treatment Preferences Outpatient therapy.   Client Statement of Needs Brandy [name] would like to focus on self-care, eating healthfully, engaging in consistent exercise, focusing on sense of self,  being more organized and goal oriented, process past events including childhood, reduce negative self-talk.   Treatment Level Weekly  Symptoms  Depression: loss of interest, feeling down, poor sleep, lethargy, fluctuating appetite, feeling bad about self, and trouble concentrating.    (Status: maintained) Anxiety:  feeling anxious, difficulty managing worry, worrying about different things, trouble relaxing, restlessness, and feeling afraid as if something awful might happen.   (Status: maintained)  Goals:   Brandy [name] experiences symptoms of depression and anxiety.    Target Date: 07/19/23 Frequency:  Weekly  Progress: 0 Modality: individual    Therapist will provide  referrals for additional resources as appropriate.  Therapist will provide psycho-education regarding Adalee's diagnosis and corresponding treatment approaches and interventions. Licensed Clinical Social Worker, [place], LCSW will support the patient's ability to achieve the goals identified. will employ CBT, BA, Problem-solving, Solution Focused, Mindfulness,  coping skills, & other evidenced-based practices will be used to promote progress towards healthy functioning to help manage decrease symptoms associated with her diagnosis.   Reduce overall level, frequency, and intensity of the feelings of depression & anxiety and evidenced by decreased overall symptoms  from 6 to 7 days/week to 0 to 1 days/week per client report for at least 3 consecutive months. Verbally express understanding of the relationship between feelings of depression, anxiety and their impact on thinking patterns and behaviors. Verbalize an understanding of the role that distorted thinking plays in creating fears, excessive worry, and ruminations.    Brandy [name] participated in the creation of the treatment plan)  [name], LCSW

## 2023-01-22 DIAGNOSIS — F5105 Insomnia due to other mental disorder: Secondary | ICD-10-CM | POA: Diagnosis not present

## 2023-01-22 DIAGNOSIS — F3181 Bipolar II disorder: Secondary | ICD-10-CM | POA: Diagnosis not present

## 2023-01-22 DIAGNOSIS — F411 Generalized anxiety disorder: Secondary | ICD-10-CM | POA: Diagnosis not present

## 2023-01-22 DIAGNOSIS — R41840 Attention and concentration deficit: Secondary | ICD-10-CM | POA: Diagnosis not present

## 2023-01-24 ENCOUNTER — Ambulatory Visit: Payer: HMO | Admitting: Psychology

## 2023-01-24 DIAGNOSIS — F339 Major depressive disorder, recurrent, unspecified: Secondary | ICD-10-CM | POA: Diagnosis not present

## 2023-01-24 NOTE — Progress Notes (Signed)
Brandy [name]  Patient ID: Brandy [name], MRN: [id number]   Date: 01/24/23  Time Spent: 12:06 pm - 12:59 pm : 53 Minutes  Treatment Type: Individual Therapy.  Reported Symptoms: depression and anxiety.  Mental Status Exam: Appearance:  Casual     Behavior: Appropriate  Motor: Normal  Speech/Language:  Clear and Coherent  Affect: Congruent and Tearful  Mood: dysthymic  Thought process: normal  Thought content:   WNL  Sensory/Perceptual disturbances:   WNL  Orientation: oriented to person, place, time/date, and situation  Attention: Good  Concentration: Good  Memory: WNL  Fund of knowledge:  Good  Insight:   Good  Judgment:  Good  Impulse Control: Good   Risk Assessment: Danger to Self:  No Self-injurious Behavior: No Danger to Others: No Duty to Warn:no Physical Aggression / Violence:No  Access to Firearms a concern: No  Gang Involvement:No   Subjective:   Brandy [name] participated from car, via video, is aware of limitations, and consented to treatment. Therapist participated from home office. Brandy [name] reviewed the events of the past week. She noted being quite stressed by a recent conversation with her father regarding politics. She noted family stressors with extended family who [place]. She noted her mother becoming pregnant after her parent's separation and noted that her sister Brandy [name]) passes a few hours after birth. She noted continuing to carry difficult feelings about this life event. She noted challenging her father's recall of events and noted this conversation being tense. She noted a need to manage her frustration regarding other people's opposing beliefs. She noted this possibly "setting" them back in their relationship. We explored this experience and her worry in relation to it. She noted crying, swearing, and being loud during the conversation. We processed during the session and worked on feeling  identification. We will work on processing her feelings about life stressors going forward. She denied any SI during the session. Therapist validated Brandy [name]'s feelings and provided supportive therapy.   Interventions:  CBT & interpersonal.   Diagnosis:  Episode of recurrent major depressive disorder, unspecified depression episode severity [facility])  Psychiatric Treatment: Yes , Brandy Section, Brandy [name].     Treatment Plan:  [name], self-aware, and motivated for change.   Support System: Family and friends.    Client Treatment Preferences Outpatient therapy.   Client Statement of Needs Reauna would like to focus on self-care, eating healthfully, engaging in consistent exercise, focusing on sense of self,  being more organized and goal oriented, process past events including childhood, reduce negative self-talk.   Treatment Level Weekly  Symptoms  Depression: loss of interest, feeling down, poor sleep, lethargy, fluctuating appetite, feeling bad about self, and trouble concentrating.    (Status: maintained) Anxiety:  feeling anxious, difficulty managing worry, worrying about different things, trouble relaxing, restlessness, and feeling afraid as if something awful might happen.   (Status: maintained)  Goals:   Leilene experiences symptoms of depression and anxiety.    Target Date: 07/19/23 Frequency: Weekly  Progress: 0 Modality: individual    Therapist will provide referrals for additional resources as appropriate.  Therapist will provide psycho-education regarding Letita's diagnosis and corresponding treatment approaches and interventions. Licensed Clinical Social Worker, [place], LCSW will support the patient's ability to achieve the goals identified. will employ CBT, BA, Problem-solving, Solution Focused, Mindfulness,  coping skills, & other evidenced-based practices will be used to promote progress towards healthy functioning to help manage  decrease symptoms associated  with her diagnosis.   Reduce overall level, frequency, and intensity of the feelings of depression & anxiety and evidenced by decreased overall symptoms  from 6 to 7 days/week to 0 to 1 days/week per client report for at least 3 consecutive months. Verbally express understanding of the relationship between feelings of depression, anxiety and their impact on thinking patterns and behaviors. Verbalize an understanding of the role that distorted thinking plays in creating fears, excessive worry, and ruminations.    Brandy [name] participated in the creation of the treatment plan)  Brandy Ovens, LCSW

## 2023-01-31 ENCOUNTER — Ambulatory Visit: Payer: HMO | Admitting: Psychology

## 2023-01-31 DIAGNOSIS — F339 Major depressive disorder, recurrent, unspecified: Secondary | ICD-10-CM | POA: Diagnosis not present

## 2023-01-31 NOTE — Progress Notes (Signed)
[facility] Counselor/Therapist Progress Note  Patient ID: Brandy [name], MRN: [id number]   Date: 01/31/23  Time Spent: 12:05 pm - 12:58 pm : 53  Minutes  Treatment Type: Individual Therapy.  Reported Symptoms: depression and anxiety.  Mental Status Exam: Appearance:  Casual     Behavior: Appropriate  Motor: Normal  Speech/Language:  Clear and Coherent  Affect: Congruent and Tearful  Mood: dysthymic  Thought process: normal  Thought content:   WNL  Sensory/Perceptual disturbances:   WNL  Orientation: oriented to person, place, time/date, and situation  Attention: Good  Concentration: Good  Memory: WNL  Fund of knowledge:  Good  Insight:   Good  Judgment:  Good  Impulse Control: Good   Risk Assessment: Danger to Self:  No Self-injurious Behavior: No Danger to Others: No Duty to Warn:no Physical Aggression / Violence:No  Access to Firearms a concern: No  Gang Involvement:No   Subjective:   Brandy [name] participated from car, via video, is aware of limitations, and consented to treatment. Therapist participated from home office. Brandy [name] reviewed the events of the past week. Brandy [name] noted joining an exercise challenge for the next month walking everyday. Therapist praised Brandy [name] for her efforts in this area and focusing on physical health. She noted building strength but discussed the exercise being challenging.  She noted working on being more flexible with her expectations. She noted frustration regarding her husband's behavior in a specific area. 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 She noted the stressors of the upcoming elections and political climate and noted working managing these stressors. She noted being "hurt to the core" that she and her family and friends differ politically. She noted this creating relationship stressors between her and her husband. She noted previously having a boundary to not discuss politics but noted recently not upholding this  boundary herself. We processed this during the session and ways to manage the distress and work on acceptance. We worked on identifying the positives in the relationship despite her lack of alignment in this area. We worked on beginning to process her feelings of self-worth and the effect of the glocal and political unrest in relation to day to day joys.  We will work on processing this going forward. Brandy [name] was engaged during the session and receptive to feedback. Therapist praised Brandy [name] for her flexibility and provided supportive therapy.   Interventions:  CBT & interpersonal.   Diagnosis:  Episode of recurrent major depressive disorder, unspecified depression episode severity [facility])  Psychiatric Treatment: Yes , Brandy Section, Brandy [name].     Treatment Plan:  [name], self-aware, and motivated for change.   Support System: Family and friends.    Client Treatment Preferences Outpatient therapy.   Client Statement of Needs Brandy [name] would like to focus on self-care, eating healthfully, engaging in consistent exercise, focusing on sense of self,  being more organized and goal oriented, process past events including childhood, reduce negative self-talk.   Treatment Level Weekly  Symptoms  Depression: loss of interest, feeling down, poor sleep, lethargy, fluctuating appetite, feeling bad about self, and trouble concentrating.    (Status: maintained) Anxiety:  feeling anxious, difficulty managing worry, worrying about different things, trouble relaxing, restlessness, and feeling afraid as if something awful might happen.   (Status: maintained)  Goals:   Brandy [name] experiences symptoms of depression and anxiety.    Target Date: 07/19/23 Frequency: Weekly  Progress: 0 Modality: individual    Therapist will provide referrals for additional  resources as appropriate.  Therapist will provide psycho-education regarding Brandy [name]'s diagnosis and corresponding treatment  approaches and interventions. Licensed Clinical Social Worker, Brandy [name], Brandy [name] will support the patient's ability to achieve the goals identified. will employ CBT, BA, Problem-solving, Solution Focused, Mindfulness,  coping skills, & other evidenced-based practices will be used to promote progress towards healthy functioning to help manage decrease symptoms associated with her diagnosis.   Reduce overall level, frequency, and intensity of the feelings of depression & anxiety and evidenced by decreased overall symptoms  from 6 to 7 days/week to 0 to 1 days/week per client report for at least 3 consecutive months. Verbally express understanding of the relationship between feelings of depression, anxiety and their impact on thinking patterns and behaviors. Verbalize an understanding of the role that distorted thinking plays in creating fears, excessive worry, and ruminations.    Brandy [name] participated in the creation of the treatment plan)  [name], Brandy [name]

## 2023-02-07 ENCOUNTER — Ambulatory Visit (INDEPENDENT_AMBULATORY_CARE_PROVIDER_SITE_OTHER): Payer: HMO | Admitting: Psychology

## 2023-02-07 DIAGNOSIS — F339 Major depressive disorder, recurrent, unspecified: Secondary | ICD-10-CM | POA: Diagnosis not present

## 2023-02-07 NOTE — Progress Notes (Signed)
[facility] Counselor/Therapist Progress Note  Patient ID: Brandy [name], MRN: [id number]   Date: 02/07/23  Time Spent: 12:10 pm - 1:02 pm : 52 Minutes  Treatment Type: Individual Therapy.  Reported Symptoms: depression and anxiety.  Mental Status Exam: Appearance:  Casual     Behavior: Appropriate  Motor: Normal  Speech/Language:  Clear and Coherent  Affect: Congruent and Tearful  Mood: dysthymic  Thought process: normal  Thought content:   WNL  Sensory/Perceptual disturbances:   WNL  Orientation: oriented to person, place, time/date, and situation  Attention: Good  Concentration: Good  Memory: WNL  Fund of knowledge:  Good  Insight:   Good  Judgment:  Good  Impulse Control: Good   Risk Assessment: Danger to Self:  No Self-injurious Behavior: No Danger to Others: No Duty to Warn:no Physical Aggression / Violence:No  Access to Firearms a concern: No  Gang Involvement:No   Subjective:   Brandy [name] participated from car, via video, is aware of limitations, and consented to treatment. Therapist participated from home office. Brandy [name] reviewed the events of the past week. Brandy [name] noted attending a church sermon and noting this resonating with her. She noted a recent frustration with her father who has been contacting her often and noted preference that they speak 2x per month. During the most recent conversation, she noted receiving mixed messages, regarding his adherence of her boundary. She noted that she "stood my ground". She noted difficulty navigating this and identifying repercussions for this going forward. We explored this, during the session, and worked on identifying repercussions and ways to communicate boundaries.  We roleplayed this during the session. She noted a recent disagreement with her daughter and noted this escalating and causing her, Brandy [name], great distress. Brandy [name] noted her daughter's history of difficulty managing her mood and being abrasive and  antagonitic. We discussed the effect of the most recent interaction on her mood and she discussed how she managed this and used tools in session to de-escalate and also be assertive. Therapist praised Brandy [name] for her effort in the session. Therapist encouraged Brandy [name] to continue managing her frustration and expectations. Therapist provided supportive therapy and a follow-up was scheduled for continued treatment.   Interventions:  CBT & interpersonal.   Diagnosis:  Episode of recurrent major depressive disorder, unspecified depression episode severity [facility])  Psychiatric Treatment: Yes , [name], MD.     Treatment Plan:  [name], self-aware, and motivated for change.   Support System: Family and friends.    Client Treatment Preferences Outpatient therapy.   Client Statement of Needs Brandy [name] would like to focus on self-care, eating healthfully, engaging in consistent exercise, focusing on sense of self,  being more organized and goal oriented, process past events including childhood, reduce negative self-talk.   Treatment Level Weekly  Symptoms  Depression: loss of interest, feeling down, poor sleep, lethargy, fluctuating appetite, feeling bad about self, and trouble concentrating.    (Status: maintained) Anxiety:  feeling anxious, difficulty managing worry, worrying about different things, trouble relaxing, restlessness, and feeling afraid as if something awful might happen.   (Status: maintained)  Goals:   Brandy [name] experiences symptoms of depression and anxiety.    Target Date: 07/19/23 Frequency: Weekly  Progress: 0 Modality: individual    Therapist will provide referrals for additional resources as appropriate.  Therapist will provide psycho-education regarding Lizzeth's diagnosis and corresponding treatment approaches and interventions. Licensed Clinical Social Worker, [place], LCSW will support the patient's ability to  achieve the goals  identified. will employ CBT, BA, Problem-solving, Solution Focused, Mindfulness,  coping skills, & other evidenced-based practices will be used to promote progress towards healthy functioning to help manage decrease symptoms associated with her diagnosis.   Reduce overall level, frequency, and intensity of the feelings of depression & anxiety and evidenced by decreased overall symptoms  from 6 to 7 days/week to 0 to 1 days/week per client report for at least 3 consecutive months. Verbally express understanding of the relationship between feelings of depression, anxiety and their impact on thinking patterns and behaviors. Verbalize an understanding of the role that distorted thinking plays in creating fears, excessive worry, and ruminations.    Brandy [name] participated in the creation of the treatment plan)  [name], LCSW

## 2023-02-14 ENCOUNTER — Ambulatory Visit: Payer: HMO | Admitting: Psychology

## 2023-02-14 DIAGNOSIS — F339 Major depressive disorder, recurrent, unspecified: Secondary | ICD-10-CM | POA: Diagnosis not present

## 2023-02-14 NOTE — Progress Notes (Signed)
[facility] Counselor/Therapist Progress Note  Patient ID: LENORA GOMES, MRN: [id number]   Date: 02/14/23  Time Spent: 12:06 pm - 12:59 pm : 53  Minutes  Treatment Type: Individual Therapy.  Reported Symptoms: depression and anxiety.  Mental Status Exam: Appearance:  Casual     Behavior: Appropriate  Motor: Normal  Speech/Language:  Clear and Coherent  Affect: Congruent and Tearful  Mood: dysthymic  Thought process: normal  Thought content:   WNL  Sensory/Perceptual disturbances:   WNL  Orientation: oriented to person, place, time/date, and situation  Attention: Good  Concentration: Good  Memory: WNL  Fund of knowledge:  Good  Insight:   Good  Judgment:  Good  Impulse Control: Good   Risk Assessment: Danger to Self:  No Self-injurious Behavior: No Danger to Others: No Duty to Warn:no Physical Aggression / Violence:No  Access to Firearms a concern: No  Gang Involvement:No   Subjective:   Mitsuko Luera Volante participated from car, via video, is aware of limitations, and consented to treatment. Therapist participated from home office. Ajla reviewed the events of the past week. Caitlynn noted interpersonal stressors, with her friend, due to political differences and noted providing feed back regarding her frustration with her friend. We continued to explored her frustration regarding having loved ones who have different political opinions than her. We worked on processing this during the session. Jessamy noted feeling distress with this issue. She noted previously setting boundaries with her friend and husband and, at times, the boundaries being violated. She noted reacting strongly towards these violations. We processed this during the session and therapist invited Nasreen to rescript this experience and to identify additional and more adaptive ways in the future. She endorsed anticipatory anxiety regarding the election and noted feeling "scared". We discussed possibility of  reducing social media and internet consumption on the topic, focusing on positive and productive tasks.She noted the struggle between feeling able to do things but her body not allowing many of these tasks or activities. She noted a need to engage in PT exercises to strengthen her body. We discussed working on identifying expectations for self, working on identify steps towards her long-term goals, and building cognitive flexibility and managing negative self-talk going forward. Glynda was engaged and motivated and expressed commitment towards goals.   Interventions:  CBT & interpersonal.   Diagnosis:  Episode of recurrent major depressive disorder, unspecified depression episode severity [facility])  Psychiatric Treatment: Yes , [name], MD.     Treatment Plan:  Client Abilities/Strengths Lesta is intelligent, self-aware, and motivated for change.   Support System: Family and friends.    Client Treatment Preferences Outpatient therapy.   Client Statement of Needs Susanna would like to focus on self-care, eating healthfully, engaging in consistent exercise, focusing on sense of self,  being more organized and goal oriented, process past events including childhood, reduce negative self-talk.   Treatment Level Weekly  Symptoms  Depression: loss of interest, feeling down, poor sleep, lethargy, fluctuating appetite, feeling bad about self, and trouble concentrating.    (Status: maintained) Anxiety:  feeling anxious, difficulty managing worry, worrying about different things, trouble relaxing, restlessness, and feeling afraid as if something awful might happen.   (Status: maintained)  Goals:   Janiel experiences symptoms of depression and anxiety.    Target Date: 07/19/23 Frequency: Weekly  Progress: 0 Modality: individual    Therapist will provide referrals for additional resources as appropriate.  Therapist will provide psycho-education regarding Tywanda's diagnosis and corresponding  treatment approaches and interventions. Licensed Clinical Social Worker, [place], LCSW will support the patient's ability to achieve the goals identified. will employ CBT, BA, Problem-solving, Solution Focused, Mindfulness,  coping skills, & other evidenced-based practices will be used to promote progress towards healthy functioning to help manage decrease symptoms associated with her diagnosis.   Reduce overall level, frequency, and intensity of the feelings of depression & anxiety and evidenced by decreased overall symptoms  from 6 to 7 days/week to 0 to 1 days/week per client report for at least 3 consecutive months. Verbally express understanding of the relationship between feelings of depression, anxiety and their impact on thinking patterns and behaviors. Verbalize an understanding of the role that distorted thinking plays in creating fears, excessive worry, and ruminations.    [name] participated in the creation of the treatment plan)  [name], LCSW

## 2023-02-18 IMAGING — MG MM DIGITAL SCREENING BILAT W/ TOMO AND CAD
8 of 15 series · 8 of 40 positions shown · non-contrast
Comparison: Previous exam(s).

CLINICAL DATA: Screening.

EXAM:
DIGITAL SCREENING BILATERAL MAMMOGRAM WITH TOMOSYNTHESIS AND CAD
TECHNIQUE: Bilateral screening digital craniocaudal and mediolateral oblique
mammograms were obtained. Bilateral screening digital breast
tomosynthesis was performed. The images were evaluated with
computer-aided detection.

[L CC]
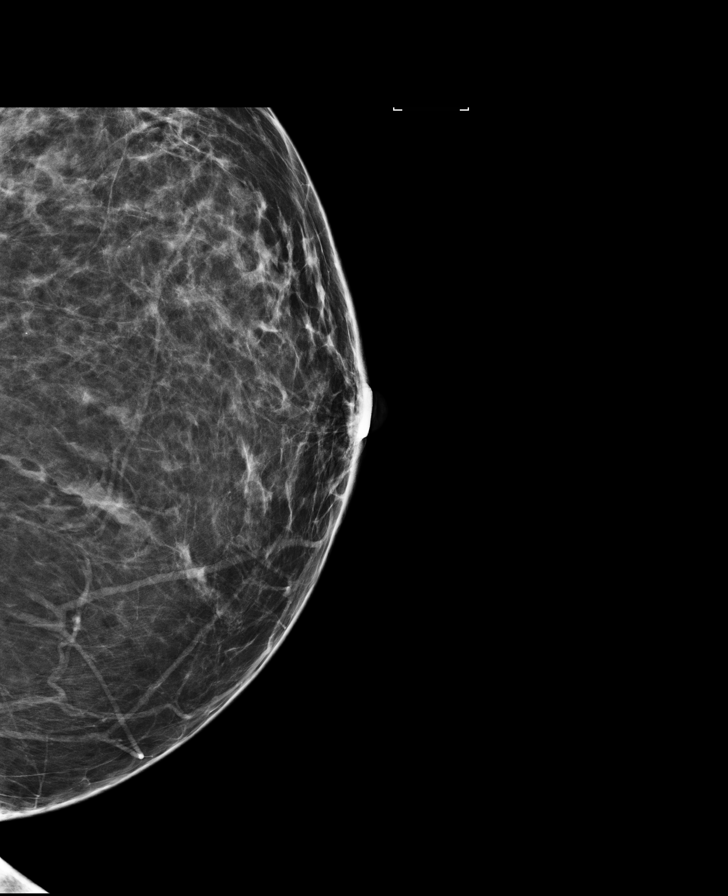

[L CC synth-2D]
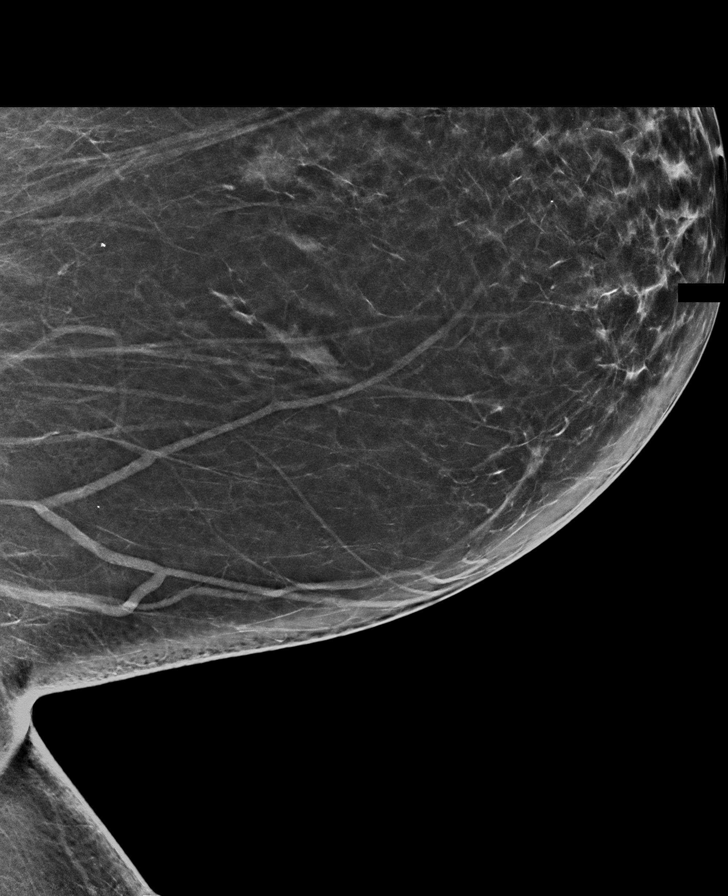

[R CC synth-2D (1 of 2)]
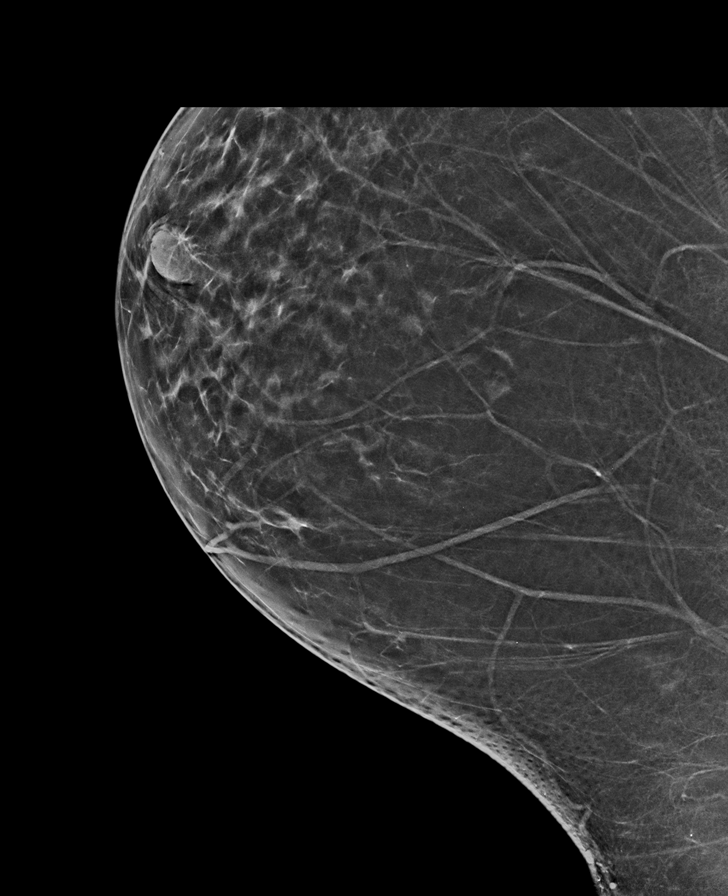

[R CC synth-2D (2 of 2)]
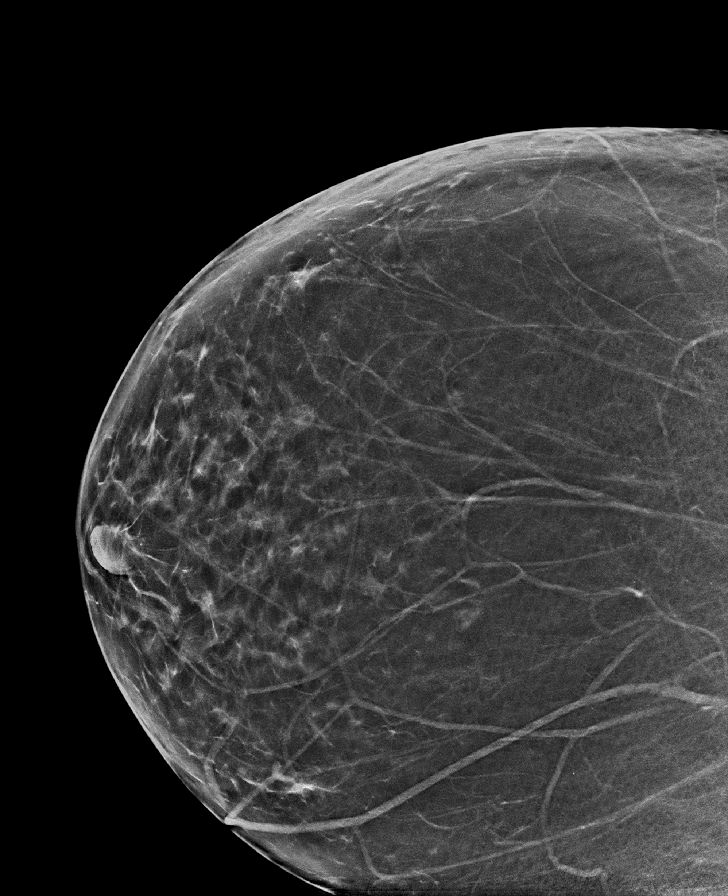

[R MLO synth-2D (1 of 2)]
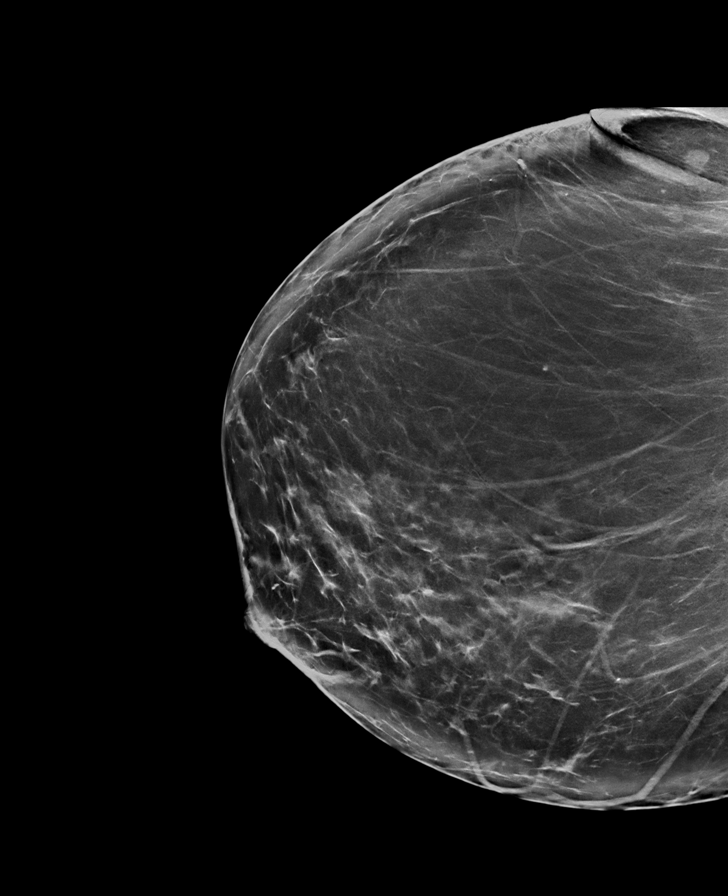

[L MLO synth-2D]
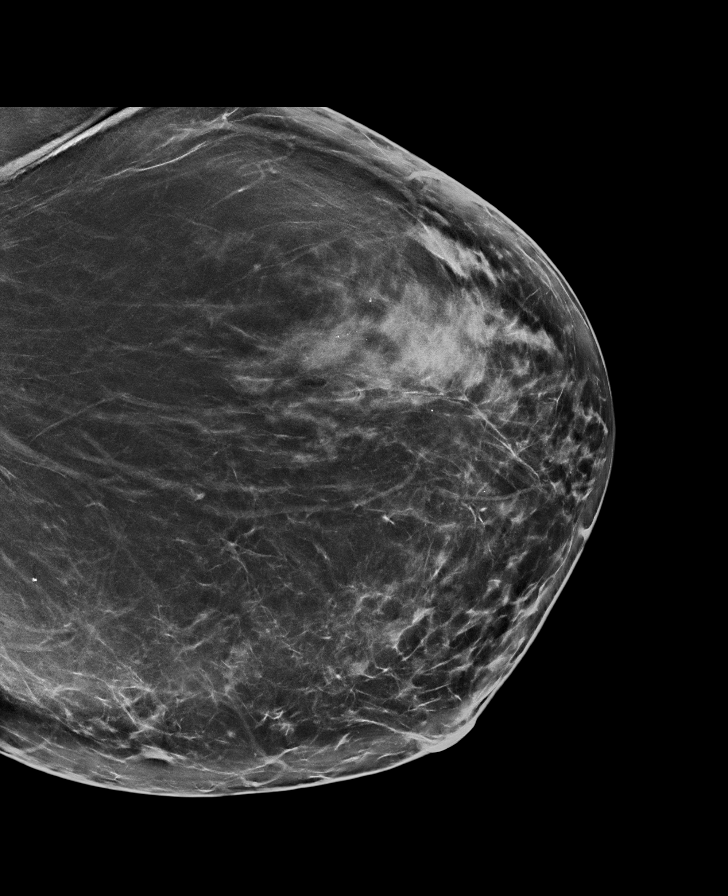

[R MLO synth-2D (2 of 2)]
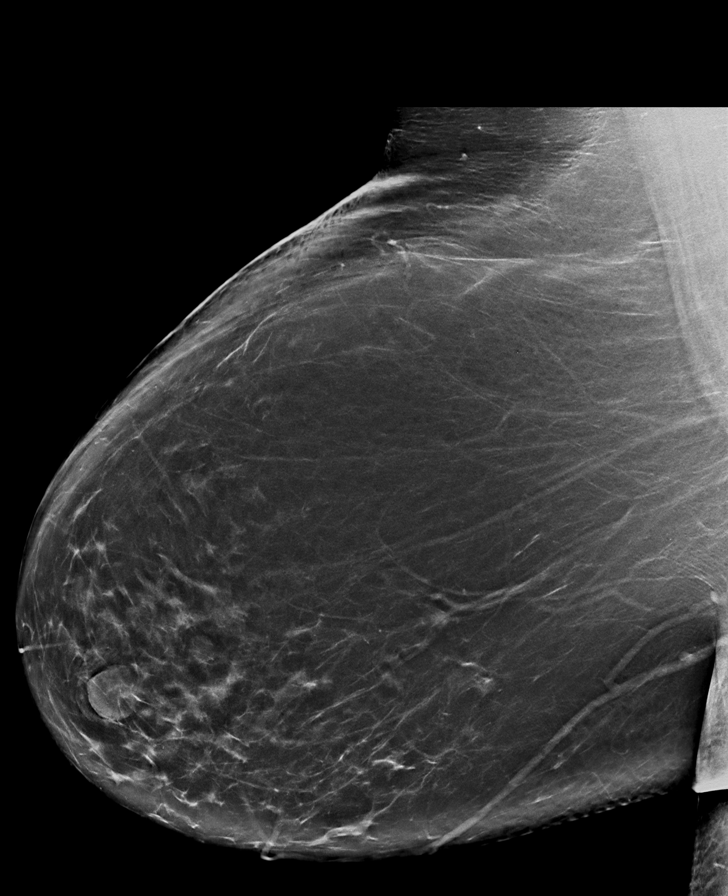

[L CC tomo · tomo slice 41/82.0]
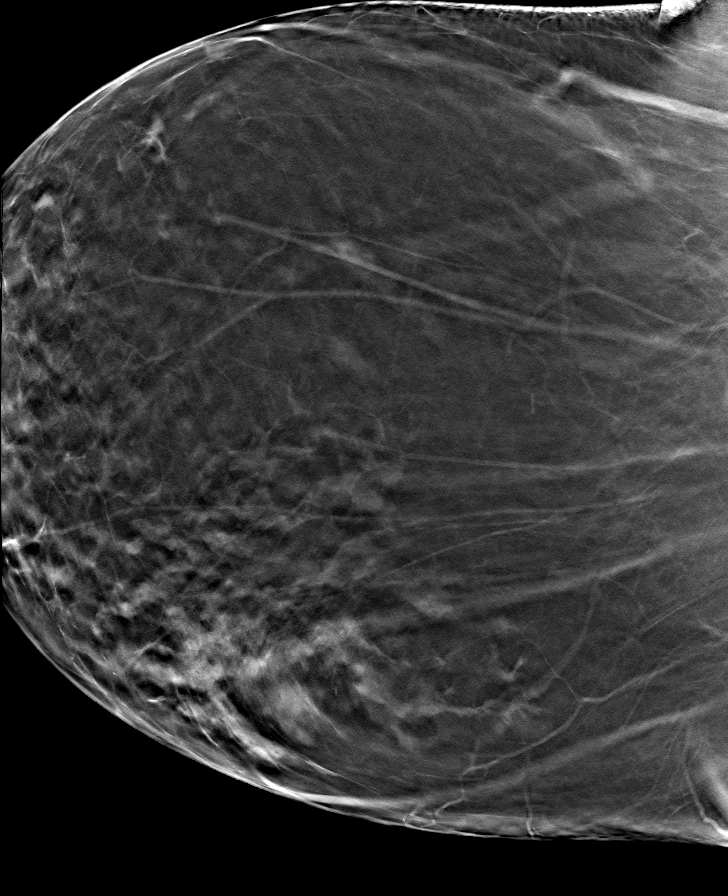

[8 of 40 positions shown; findings below may reference images not displayed]

ACR Breast Density Category b: There are scattered areas of
fibroglandular density.
FINDINGS: There are no findings suspicious for malignancy.
IMPRESSION: No mammographic evidence of malignancy. A result letter of this
screening mammogram will be mailed directly to the patient.

RECOMMENDATION:
Screening mammogram in one year. (Code:51-O-LD2)

BI-RADS CATEGORY  1: Negative.

## 2023-02-21 ENCOUNTER — Ambulatory Visit: Payer: HMO | Admitting: Psychology

## 2023-02-21 DIAGNOSIS — F339 Major depressive disorder, recurrent, unspecified: Secondary | ICD-10-CM | POA: Diagnosis not present

## 2023-02-21 NOTE — Progress Notes (Signed)
[facility] Counselor/Therapist Progress Note  Patient ID: Brandy [name], MRN: [id number]   Date: 02/21/23  Time Spent: 12:07 pm - 1:02 pm : 55  Minutes  Treatment Type: Individual Therapy.  Reported Symptoms: depression and anxiety.  Mental Status Exam: Appearance:  Casual     Behavior: Appropriate  Motor: Normal  Speech/Language:  Clear and Coherent  Affect: Congruent and Tearful  Mood: dysthymic  Thought process: normal  Thought content:   WNL  Sensory/Perceptual disturbances:   WNL  Orientation: oriented to person, place, time/date, and situation  Attention: Good  Concentration: Good  Memory: WNL  Fund of knowledge:  Good  Insight:   Good  Judgment:  Good  Impulse Control: Good   Risk Assessment: Danger to Self:  No Self-injurious Behavior: No Danger to Others: No Duty to Warn:no Physical Aggression / Violence:No  Access to Firearms a concern: No  Gang Involvement:No   Subjective:   Brandy [name] participated from car, via video, is aware of limitations, and consented to treatment. Therapist participated from home office. [name] reviewed the events of the past week. She noted her daughter accusing her of being "really manipulative" really "hit" her. We explored this experience and Jinan was tearful during the session. She noted her own worry that she could be manipulative but noted that her daughter is often quite antagonistic and often "aims to hurt me".  We processed her feelings regarding this and discussed specific behavior that was labeled as "manipulative". She noted feeling disconnected from her grand-daughter and her attempts to stay connected and spend time with her. We highlighted a lack of empathy from her daughter in regards to this. We worked on identifying possible contributing factors to this situation and the possibility of unreasonable expectations from her daughter. Therapist validated Brandy [name]'s feelings and experience during the  session.Therapist validated and normalized Brandy [name]'s feelings and experience during the session. She noted a need to process the inequity in relationship with her daughter. Therapist provided supportive therapy.   Interventions:  CBT & interpersonal.   Diagnosis:  Episode of recurrent major depressive disorder, unspecified depression episode severity [facility])  Psychiatric Treatment: Yes , [name], MD.     Treatment Plan:  [name], self-aware, and motivated for change.   Support System: Family and friends.    Client Treatment Preferences Outpatient therapy.   Client Statement of Needs [name] would like to focus on self-care, eating healthfully, engaging in consistent exercise, focusing on sense of self,  being more organized and goal oriented, process past events including childhood, reduce negative self-talk.   Treatment Level Weekly  Symptoms  Depression: loss of interest, feeling down, poor sleep, lethargy, fluctuating appetite, feeling bad about self, and trouble concentrating.    (Status: maintained) Anxiety:  feeling anxious, difficulty managing worry, worrying about different things, trouble relaxing, restlessness, and feeling afraid as if something awful might happen.   (Status: maintained)  Goals:   [name] experiences symptoms of depression and anxiety.    Target Date: 07/19/23 Frequency: Weekly  Progress: 0 Modality: individual    Therapist will provide referrals for additional resources as appropriate.  Therapist will provide psycho-education regarding Brandy [name]'s diagnosis and corresponding treatment approaches and interventions. Licensed Clinical Social Worker, [name], LCSW will support the patient's ability to achieve the goals identified. will employ CBT, BA, Problem-solving, Solution Focused, Mindfulness,  coping skills, & other evidenced-based practices will be used to promote progress towards healthy functioning to help  manage decrease symptoms associated  with her diagnosis.   Reduce overall level, frequency, and intensity of the feelings of depression & anxiety and evidenced by decreased overall symptoms  from 6 to 7 days/week to 0 to 1 days/week per client report for at least 3 consecutive months. Verbally express understanding of the relationship between feelings of depression, anxiety and their impact on thinking patterns and behaviors. Verbalize an understanding of the role that distorted thinking plays in creating fears, excessive worry, and ruminations.    [name] participated in the creation of the treatment plan)  [name], LCSW

## 2023-02-28 ENCOUNTER — Ambulatory Visit: Payer: HMO | Admitting: Psychology

## 2023-02-28 DIAGNOSIS — F339 Major depressive disorder, recurrent, unspecified: Secondary | ICD-10-CM | POA: Diagnosis not present

## 2023-02-28 NOTE — Progress Notes (Signed)
[facility] Counselor/Therapist Progress Note  Patient ID: Brandy [name], MRN: [id number]   Date: 02/28/23  Time Spent: 12:08 pm - 1:00 pm : 52 Minutes  Treatment Type: Individual Therapy.  Reported Symptoms: depression and anxiety.  Mental Status Exam: Appearance:  Casual     Behavior: Appropriate  Motor: Normal  Speech/Language:  Clear and Coherent  Affect: Congruent and Tearful  Mood: dysthymic  Thought process: normal  Thought content:   WNL  Sensory/Perceptual disturbances:   WNL  Orientation: oriented to person, place, time/date, and situation  Attention: Good  Concentration: Good  Memory: WNL  Fund of knowledge:  Good  Insight:   Good  Judgment:  Good  Impulse Control: Good   Risk Assessment: Danger to Self:  No Self-injurious Behavior: No Danger to Others: No Duty to Warn:no Physical Aggression / Violence:No  Access to Firearms a concern: No  Gang Involvement:No   Subjective:   Brandy [name] participated from car, via video, is aware of limitations, and consented to treatment. Therapist participated from home office. Brandy [name] reviewed the events of the past week.  She noted that "I needed to be there". She later declined due to feeling ill and tired and having alternative plans. She noted feeling badly and a sense of guilt for declining. She noted "feeling really bad". Brandy [name] noted struggle with "people pleasing".Brandy [name] noted not wanting to disappoint others, wanting to be needed, "I want to be everything to everyone".  She noted her value being what she does for them and noted not being valued for who she is. She noted a difficult balance of being a mother and being a person, both separately and together. She noted that her children often don't "like" everything about her. We worked on identifying reasonable expectations for these dynamics. We worked on identifying the source of her expectations. She noted perfection coming from her childhood. Therapist  highlighted Brandy [name]'s pursuit of perfection and dichotomous thinking possibly playing part in her expectations and feelings of disappointment. Therapist encouraged Brandy [name] to continue to work on identifying her expectations and self-talk in relation. She noted her mother being "super critical" and "crazy". She noted often working on being away from home, during childhood, due to this and her father being seen as intimidating. Therapist praised Brandy [name] for her effort during the session and vulnerability. Therapist provided supportive therapy.   Interventions:  CBT & interpersonal.   Diagnosis:  Episode of recurrent major depressive disorder, unspecified depression episode severity [facility]andy [name])  Psychiatric Treatment: Yes , [name], MD.     Treatment Plan:  [name], self-aware, and motivated for change.   Support System: Family and friends.    Client Treatment Preferences Outpatient therapy.   Client Statement of Needs Brandy [name] would like to focus on self-care, eating healthfully, engaging in consistent exercise, focusing on sense of self,  being more organized and goal oriented, process past events including childhood, reduce negative self-talk.   Treatment Level Weekly  Symptoms  Depression: loss of interest, feeling down, poor sleep, lethargy, fluctuating appetite, feeling bad about self, and trouble concentrating.    (Status: maintained) Anxiety:  feeling anxious, difficulty managing worry, worrying about different things, trouble relaxing, restlessness, and feeling afraid as if something awful might happen.   (Status: maintained)  Goals:   Brandy [name] experiences symptoms of depression and anxiety.    Target Date: 07/19/23 Frequency: Weekly  Progress: 0 Modality: individual    Therapist will provide referrals for additional resources as appropriate.  Therapist will provide psycho-education regarding Brandy [name]'s diagnosis and corresponding treatment approaches  and interventions. Licensed Clinical Social Worker, [place], LCSW will support the patient's ability to achieve the goals identified. will employ CBT, BA, Problem-solving, Solution Focused, Mindfulness,  coping skills, & other evidenced-based practices will be used to promote progress towards healthy functioning to help manage decrease symptoms associated with her diagnosis.   Reduce overall level, frequency, and intensity of the feelings of depression & anxiety and evidenced by decreased overall symptoms  from 6 to 7 days/week to 0 to 1 days/week per client report for at least 3 consecutive months. Verbally express understanding of the relationship between feelings of depression, anxiety and their impact on thinking patterns and behaviors. Verbalize an understanding of the role that distorted thinking plays in creating fears, excessive worry, and ruminations.    Brandy [name] participated in the creation of the treatment plan)  [name], LCSW

## 2023-03-07 ENCOUNTER — Ambulatory Visit: Payer: HMO | Admitting: Psychology

## 2023-03-07 DIAGNOSIS — F339 Major depressive disorder, recurrent, unspecified: Secondary | ICD-10-CM | POA: Diagnosis not present

## 2023-03-07 NOTE — Progress Notes (Signed)
[facility] Counselor/Therapist Progress Note  Patient ID: Brandy [name], MRN: [id number]   Date: 03/07/23  Time Spent: 12:06 pm - 12:59 pm : 53 Minutes  Treatment Type: Individual Therapy.  Reported Symptoms: depression and anxiety.  Mental Status Exam: Appearance:  Casual     Behavior: Appropriate  Motor: Normal  Speech/Language:  Clear and Coherent  Affect: Congruent and Tearful  Mood: dysthymic  Thought process: normal  Thought content:   WNL  Sensory/Perceptual disturbances:   WNL  Orientation: oriented to person, place, time/date, and situation  Attention: Good  Concentration: Good  Memory: WNL  Fund of knowledge:  Good  Insight:   Good  Judgment:  Good  Impulse Control: Good   Risk Assessment: Danger to Self:  No Self-injurious Behavior: No Danger to Others: No Duty to Warn:no Physical Aggression / Violence:No  Access to Firearms a concern: No  Gang Involvement:No   Subjective:   Brandy [name] participated from car, via video, is aware of limitations, and consented to treatment. Therapist participated from home office. Brittan reviewed the events of the past week. Brandy [name] noted feeling "devastated" due to the results of the presidential election and was tearful during the session. She noted this is "the end of democracy". She noted difficulty dealing with the impact of this on the country. She noted receiving support from her husband and being grateful for his empathy. She noted being overwhelmed by the possible effects of this new presidency. We processed this during the session and explored feelings. We worked on being present, taking inventory of current state, identify difference  between logic/fact and feelings, managing rumination, focus in positive areas, and identify areas of control and lack of control. She noted difficulty not knowing what people think about her and stated "I want to know". She described a level of discomfort of not knowing. We  processed this during the session. We identified that Brandy [name] experiences "a high level of tension". We will continue to explore this going forward. Therapist praised Rozalyn for her effort to identify her feelings and needs between session. Therapist validated Brandy [name]'s experience and provided supportive therapy.   Interventions:  CBT & interpersonal.   Diagnosis:  Episode of recurrent major depressive disorder, unspecified depression episode severity [facility])  Psychiatric Treatment: Yes , [name], MD.     Treatment Plan:  [name], self-aware, and motivated for change.   Support System: Family and friends.    Client Treatment Preferences Outpatient therapy.   Client Statement of Needs Brandy [name] would like to focus on self-care, eating healthfully, engaging in consistent exercise, focusing on sense of self,  being more organized and goal oriented, process past events including childhood, reduce negative self-talk.   Treatment Level Weekly  Symptoms  Depression: loss of interest, feeling down, poor sleep, lethargy, fluctuating appetite, feeling bad about self, and trouble concentrating.    (Status: maintained) Anxiety:  feeling anxious, difficulty managing worry, worrying about different things, trouble relaxing, restlessness, and feeling afraid as if something awful might happen.   (Status: maintained)  Goals:   Brandy [name] experiences symptoms of depression and anxiety.    Target Date: 07/19/23 Frequency: Weekly  Progress: 0 Modality: individual    Therapist will provide referrals for additional resources as appropriate.  Therapist will provide psycho-education regarding Brandy [name]'s diagnosis and corresponding treatment approaches and interventions. Licensed Clinical Social Worker, [place], LCSW will support the patient's ability to achieve the goals identified. will employ CBT, BA, Problem-solving, Solution Focused, Mindfulness,  coping skills, & other  evidenced-based practices will be used to promote progress towards healthy functioning to help manage decrease symptoms associated with her diagnosis.   Reduce overall level, frequency, and intensity of the feelings of depression & anxiety and evidenced by decreased overall symptoms  from 6 to 7 days/week to 0 to 1 days/week per client report for at least 3 consecutive months. Verbally express understanding of the relationship between feelings of depression, anxiety and their impact on thinking patterns and behaviors. Verbalize an understanding of the role that distorted thinking plays in creating fears, excessive worry, and ruminations.    [name] participated in the creation of the treatment plan)  [name], LCSW

## 2023-03-14 ENCOUNTER — Ambulatory Visit: Payer: HMO | Admitting: Psychology

## 2023-03-14 DIAGNOSIS — F339 Major depressive disorder, recurrent, unspecified: Secondary | ICD-10-CM

## 2023-03-14 DIAGNOSIS — F3289 Other specified depressive episodes: Secondary | ICD-10-CM

## 2023-03-14 NOTE — Progress Notes (Signed)
[facility] Counselor/Therapist Progress Note  Patient ID: Brandy [name], MRN: [id number]   Date: 03/14/23  Time Spent: 12:05 pm - 12:59 pm :  Treatment Type: Individual Therapy.  Reported Symptoms: depression and anxiety.  Mental Status Exam: Appearance:  Casual     Behavior: Appropriate  Motor: Normal  Speech/Language:  Clear and Coherent  Affect: Congruent  Mood: normal  Thought process: normal  Thought content:   WNL  Sensory/Perceptual disturbances:   WNL  Orientation: oriented to person, place, time/date, and situation  Attention: Good  Concentration: Good  Memory: WNL  Fund of knowledge:  Good  Insight:   Good  Judgment:  Good  Impulse Control: Good   Risk Assessment: Danger to Self:  No Self-injurious Behavior: No Danger to Others: No Duty to Warn:no Physical Aggression / Violence:No  Access to Firearms a concern: No  Gang Involvement:No   Subjective:   Nichell Cannizzo Space participated from car, via video, is aware of limitations, and consented to treatment. Therapist participated from home office. Cristyn reviewed the events of the past week. Jamilet discussed struggles in managing how others feel about her but noted that often she does not receive feedback on how people see her or feel about her. She noted often getting into "people pleaser mode". Therapist provided psycho-education regarding jumps to conclusions and emotional reasoning, cognitive distortions, and the effect of this on mood. We discussed the importance of challenging distortions consistently. Therapist modeled this during the session. She noted the benefit of expressing gratitude and identified feeling better, as a result. We discussed the benefit of being consistent and purposeful. We discussed ways to integrate this into her daily routine. She noted often deciding what the day would be like prior to the day starting and noted the cost of this to her mood. We worked on challenging  this during the session and ways to manage this going forward. She noted working on reducing her consumption of political information via boundaries with self in regards to social media, news, and other sources. Therapist praised Brit for her effort to maintain boundaries in this area. She noted working on interpersonal stressors, with father, and maintaining boundaries despite negative feedback and pushback. Therapist praised Addy for her effort between session and during the session. She noted interest in identifying the source of her "people pleasing". We will explore this going forward.    Interventions:  CBT & interpersonal.   Diagnosis:  Episode of recurrent major depressive disorder, unspecified depression episode severity (HCC)  Other depression  Psychiatric Treatment: Yes , [name], MD.     Treatment Plan:  Client Abilities/Strengths Arian is intelligent, self-aware, and motivated for change.   Support System: Family and friends.    Client Treatment Preferences Outpatient therapy.   Client Statement of Needs Novarae would like to focus on self-care, eating healthfully, engaging in consistent exercise, focusing on sense of self,  being more organized and goal oriented, process past events including childhood, reduce negative self-talk.   Treatment Level Weekly  Symptoms  Depression: loss of interest, feeling down, poor sleep, lethargy, fluctuating appetite, feeling bad about self, and trouble concentrating.    (Status: maintained) Anxiety:  feeling anxious, difficulty managing worry, worrying about different things, trouble relaxing, restlessness, and feeling afraid as if something awful might happen.   (Status: maintained)  Goals:   Corrine experiences symptoms of depression and anxiety.    Target Date: 07/19/23 Frequency: Weekly  Progress: 0 Modality: individual    Therapist  will provide referrals for additional resources as appropriate.  Therapist will provide  psycho-education regarding Sanae's diagnosis and corresponding treatment approaches and interventions. Licensed Clinical Social Worker, [place], LCSW will support the patient's ability to achieve the goals identified. will employ CBT, BA, Problem-solving, Solution Focused, Mindfulness,  coping skills, & other evidenced-based practices will be used to promote progress towards healthy functioning to help manage decrease symptoms associated with her diagnosis.   Reduce overall level, frequency, and intensity of the feelings of depression & anxiety and evidenced by decreased overall symptoms  from 6 to 7 days/week to 0 to 1 days/week per client report for at least 3 consecutive months. Verbally express understanding of the relationship between feelings of depression, anxiety and their impact on thinking patterns and behaviors. Verbalize an understanding of the role that distorted thinking plays in creating fears, excessive worry, and ruminations.    [name] participated in the creation of the treatment plan)  [name], LCSW

## 2023-03-21 ENCOUNTER — Ambulatory Visit: Payer: HMO | Admitting: Psychology

## 2023-04-04 ENCOUNTER — Ambulatory Visit: Payer: HMO | Admitting: Psychology

## 2023-04-04 DIAGNOSIS — F339 Major depressive disorder, recurrent, unspecified: Secondary | ICD-10-CM

## 2023-04-04 NOTE — Progress Notes (Signed)
[facility] Counselor/Therapist Progress Note  Patient ID: Brandy [name], MRN: [id number]   Date: 04/04/23  Time Spent: 12:07 pm - 12:50 pm : 43 Minutes  Treatment Type: Individual Therapy.  Reported Symptoms: depression and anxiety.  Mental Status Exam: Appearance:  Casual     Behavior: Appropriate  Motor: Normal  Speech/Language:  Clear and Coherent  Affect: Congruent  Mood: normal  Thought process: normal  Thought content:   WNL  Sensory/Perceptual disturbances:   WNL  Orientation: oriented to person, place, time/date, and situation  Attention: Good  Concentration: Good  Memory: WNL  Fund of knowledge:  Good  Insight:   Good  Judgment:  Good  Impulse Control: Good   Risk Assessment: Danger to Self:  No Self-injurious Behavior: No Danger to Others: No Duty to Warn:no Physical Aggression / Violence:No  Access to Firearms a concern: No  Gang Involvement:No   Subjective:   Brandy [name] participated from home, via video, is aware of limitations, and consented to treatment. Therapist participated from home office. Sophiea reviewed the events of the past week. Brandy [name]  noted feeling "a little sad" during thanksgiving. She noted feeling disrespected by her daughter due to a lack of communication regarding the holiday celebration. She noted that things "didn't happen the way they are supposed to". We explored this and therapist challenged this during the session. We discussed the importance of identifying expectations and ensuring that they are reasonable for the context and person. We processed this during the session. Therapist encouraged mindfulness regarding expectations going forward. Therapist praised Brandy [name] for her effort and energy during the session. Brandy [name] highlighted positives in the past week including improved functioning for her son who is autistic. She expressed commitment towards goals. Therapist praised Brandy [name] and provided supportive therapy.    Interventions:  CBT & interpersonal.   Diagnosis:  Episode of recurrent major depressive disorder, unspecified depression episode severity [facility])  Psychiatric Treatment: Yes , Brandy Section, MD.     Treatment Plan:  Client Abilities/Strengths [name] is intelligent, self-aware, and motivated for change.   Support System: Family and friends.    Client Treatment Preferences Outpatient therapy.   Client Statement of Needs [name] would like to focus on self-care, eating healthfully, engaging in consistent exercise, focusing on sense of self,  being more organized and goal oriented, process past events including childhood, reduce negative self-talk.   Treatment Level Weekly  Symptoms  Depression: loss of interest, feeling down, poor sleep, lethargy, fluctuating appetite, feeling bad about self, and trouble concentrating.    (Status: maintained) Anxiety:  feeling anxious, difficulty managing worry, worrying about different things, trouble relaxing, restlessness, and feeling afraid as if something awful might happen.   (Status: maintained)  Goals:   [name] experiences symptoms of depression and anxiety.    Target Date: 07/19/23 Frequency: Weekly  Progress: 0 Modality: individual    Therapist will provide referrals for additional resources as appropriate.  Therapist will provide psycho-education regarding [name]'s diagnosis and corresponding treatment approaches and interventions. Licensed Clinical Social Worker, [place], LCSW will support the patient's ability to achieve the goals identified. will employ CBT, BA, Problem-solving, Solution Focused, Mindfulness,  coping skills, & other evidenced-based practices will be used to promote progress towards healthy functioning to help manage decrease symptoms associated with her diagnosis.   Reduce overall level, frequency, and intensity of the feelings of depression & anxiety and evidenced by decreased overall symptoms  from 6 to 7 days/week  to 0 to 1 days/week per  client report for at least 3 consecutive months. Verbally express understanding of the relationship between feelings of depression, anxiety and their impact on thinking patterns and behaviors. Verbalize an understanding of the role that distorted thinking plays in creating fears, excessive worry, and ruminations.    [name] participated in the creation of the treatment plan)  [name], LCSW

## 2023-04-11 ENCOUNTER — Ambulatory Visit (INDEPENDENT_AMBULATORY_CARE_PROVIDER_SITE_OTHER): Payer: HMO | Admitting: Psychology

## 2023-04-11 DIAGNOSIS — F3289 Other specified depressive episodes: Secondary | ICD-10-CM | POA: Diagnosis not present

## 2023-04-11 DIAGNOSIS — F339 Major depressive disorder, recurrent, unspecified: Secondary | ICD-10-CM

## 2023-04-11 NOTE — Progress Notes (Signed)
Brandy [name], MRN: [id number]   Date: 04/11/23  Time Spent: 12:02 pm - 12;:48 pm : 48 Minutes  Treatment Type: Individual Therapy.  Reported Symptoms: depression and anxiety.  Mental Status Exam: Appearance:  Casual     Behavior: Appropriate  Motor: Normal  Speech/Language:  Clear and Coherent  Affect: Congruent  Mood: normal  Thought process: normal  Thought content:   WNL  Sensory/Perceptual disturbances:   WNL  Orientation: oriented to person, place, time/date, and situation  Attention: Good  Concentration: Good  Memory: WNL  Fund of knowledge:  Good  Insight:   Good  Judgment:  Good  Impulse Control: Good   Risk Assessment: Danger to Self:  No Self-injurious Behavior: No Danger to Others: No Duty to Warn:no Physical Aggression / Violence:No  Access to Firearms a concern: No  Gang Involvement:No   Subjective:   Brandy [name] participated from home, via video, is aware of limitations, and consented to treatment. Therapist participated from home office. Brandy [name] reviewed the events of the past week. Brandy [name] noted not physically feeling good. She noted continued strain with her daughter. She noted her attempts to communicate with her daughter regarding christmas plans in relation to her daughter and not receiving any feedback. We worked on exploring her frustrations regarding this stressor. We worked on identifying boundaries for self and others related to feedback, communication, and identifying areas of responsibility. Therapist modeled boundary setting. We worked on identifying areas of control and lack of control. Brandy [name] noted interest in identifying her boundaries related how often to reach out to her daughter and granddaughter due to feeling upset and hurt about a lack of reciprocation. We worked on exploring her feelings and how she would like to manage this. Brandy [name] was engaged and motivated during the  session. She expressed commitment towards goals. Therapist praised Brandy [name] for her effort and awareness to manage her symptoms and stressors. Therapist validated Brandy [name]'s feelings and provided support therapy.   Interventions:  CBT & interpersonal.   Diagnosis:  Episode of recurrent major depressive disorder, unspecified depression episode severity (HCC)  Other depression  Psychiatric Treatment: Yes , Brandy [name] Section, MD.     Treatment Plan:  [name], self-aware, and motivated for change.   Support System: Family and friends.    Client Treatment Preferences Outpatient therapy.   Client Statement of Needs Brandy [name] would like to focus on self-care, eating healthfully, engaging in consistent exercise, focusing on sense of self,  being more organized and goal oriented, process past events including childhood, reduce negative self-talk.   Treatment Level Weekly  Symptoms  Depression: loss of interest, feeling down, poor sleep, lethargy, fluctuating appetite, feeling bad about self, and trouble concentrating.    (Status: maintained) Anxiety:  feeling anxious, difficulty managing worry, worrying about different things, trouble relaxing, restlessness, and feeling afraid as if something awful might happen.   (Status: maintained)  Goals:   Brandy [name] experiences symptoms of depression and anxiety.    Target Date: 07/19/23 Frequency: Weekly  Progress: 0 Modality: individual    Therapist will provide referrals for additional resources as appropriate.  Therapist will provide psycho-education regarding Brandy [name]'s diagnosis and corresponding treatment approaches and interventions. Licensed Clinical Social Worker, [place], LCSW will support the patient's ability to achieve the goals identified. will employ CBT, BA, Problem-solving, Solution Focused, Mindfulness,  coping skills, & other evidenced-based practices will be used to promote progress towards healthy  functioning to help  manage decrease symptoms associated with her diagnosis.   Reduce overall level, frequency, and intensity of the feelings of depression & anxiety and evidenced by decreased overall symptoms  from 6 to 7 days/week to 0 to 1 days/week per client report for at least 3 consecutive months. Verbally express understanding of the relationship between feelings of depression, anxiety and their impact on thinking patterns and behaviors. Verbalize an understanding of the role that distorted thinking plays in creating fears, excessive worry, and ruminations.    Brandy [name] participated in the creation of the treatment plan)  [name], LCSW

## 2023-04-18 ENCOUNTER — Ambulatory Visit: Payer: HMO | Admitting: Psychology

## 2023-04-18 DIAGNOSIS — F5105 Insomnia due to other mental disorder: Secondary | ICD-10-CM | POA: Diagnosis not present

## 2023-04-18 DIAGNOSIS — F411 Generalized anxiety disorder: Secondary | ICD-10-CM | POA: Diagnosis not present

## 2023-04-18 DIAGNOSIS — R41840 Attention and concentration deficit: Secondary | ICD-10-CM | POA: Diagnosis not present

## 2023-04-18 DIAGNOSIS — F3181 Bipolar II disorder: Secondary | ICD-10-CM | POA: Diagnosis not present

## 2023-04-19 ENCOUNTER — Ambulatory Visit: Payer: HMO | Admitting: Psychology

## 2023-04-19 DIAGNOSIS — F339 Major depressive disorder, recurrent, unspecified: Secondary | ICD-10-CM

## 2023-04-19 NOTE — Progress Notes (Signed)
[facility] Counselor/Therapist Progress Note  Patient ID: Brandy [name], MRN: [id number]   Date: 04/19/23  Time Spent: 11:07 am - 11:52pm : 45 Minutes  Treatment Type: Individual Therapy.  Reported Symptoms: depression and anxiety.  Mental Status Exam: Appearance:  Casual     Behavior: Appropriate  Motor: Normal  Speech/Language:  Clear and Coherent  Affect: Tearful  Mood: dysthymic  Thought process: normal  Thought content:   WNL  Sensory/Perceptual disturbances:   WNL  Orientation: oriented to person, place, time/date, and situation  Attention: Good  Concentration: Good  Memory: WNL  Fund of knowledge:  Good  Insight:   Good  Judgment:  Good  Impulse Control: Good   Risk Assessment: Danger to Self:  No Self-injurious Behavior: No Danger to Others: No Duty to Warn:no Physical Aggression / Violence:No  Access to Firearms a concern: No  Gang Involvement:No   Subjective:   Brandy [name] participated from home, via video, is aware of limitations, and consented to treatment. Therapist participated from home office. [place] reviewed the events of the past week. Brandy [name]  noted worry regarding her daughter's current eviction and what this will mean for her daughter and grand-daughter. She noted her effort to be mindful of her expectations and work on providing support for her daughter. She noted wondering "how do I not base my happiness on being with family during the holidays?". She noted her concerted effort to focus on positives of the holiday season while managing her frustration and disappointment. She noted that her grand-daughter due not want to spend time with the family, during holidays. She noted finding this challenging with being disappointed and sad. She noted a need to change her expectations and is considering canceling the holiday celebration. We worked on processing this during the session. Therapist encouraged Brandy [name] to work on identifying needs and  communicating them consistently. We discussed working on enjoying the season while being mindful of moods and working on developing the ability to feel various ways about the holidays. Brandy [name] was engaged and motivated during the session. She expressed commitment towards goals. Therapist praised Brandy [name] and provided supportive therapy. Therapist validated Brandy [name]'s experience.    Interventions:  CBT & interpersonal.   Diagnosis:  Episode of recurrent major depressive disorder, unspecified depression episode severity [facility])  Psychiatric Treatment: Yes , [name], MD.     Treatment Plan:  [name], self-aware, and motivated for change.   Support System: Family and friends.    Client Treatment Preferences Outpatient therapy.   Client Statement of Needs Brandy [name] would like to focus on self-care, eating healthfully, engaging in consistent exercise, focusing on sense of self,  being more organized and goal oriented, process past events including childhood, reduce negative self-talk.   Treatment Level Weekly  Symptoms  Depression: loss of interest, feeling down, poor sleep, lethargy, fluctuating appetite, feeling bad about self, and trouble concentrating.    (Status: maintained) Anxiety:  feeling anxious, difficulty managing worry, worrying about different things, trouble relaxing, restlessness, and feeling afraid as if something awful might happen.   (Status: maintained)  Goals:   Brandy [name] experiences symptoms of depression and anxiety.    Target Date: 07/19/23 Frequency: Weekly  Progress: 0 Modality: individual    Therapist will provide referrals for additional resources as appropriate.  Therapist will provide psycho-education regarding Syndey's diagnosis and corresponding treatment approaches and interventions. Licensed Clinical Social Worker, [place], LCSW will support the patient's ability to achieve the goals identified. will employ  CBT, BA,  Problem-solving, Solution Focused, Mindfulness,  coping skills, & other evidenced-based practices will be used to promote progress towards healthy functioning to help manage decrease symptoms associated with her diagnosis.   Reduce overall level, frequency, and intensity of the feelings of depression & anxiety and evidenced by decreased overall symptoms  from 6 to 7 days/week to 0 to 1 days/week per client report for at least 3 consecutive months. Verbally express understanding of the relationship between feelings of depression, anxiety and their impact on thinking patterns and behaviors. Verbalize an understanding of the role that distorted thinking plays in creating fears, excessive worry, and ruminations.    Brandy [name] participated in the creation of the treatment plan)  [name], LCSW

## 2023-04-25 ENCOUNTER — Ambulatory Visit: Payer: HMO | Admitting: Psychology

## 2023-05-09 ENCOUNTER — Ambulatory Visit (INDEPENDENT_AMBULATORY_CARE_PROVIDER_SITE_OTHER): Payer: HMO | Admitting: Psychology

## 2023-05-09 DIAGNOSIS — F339 Major depressive disorder, recurrent, unspecified: Secondary | ICD-10-CM | POA: Diagnosis not present

## 2023-05-09 NOTE — Progress Notes (Signed)
 [facility] Counselor/Therapist Progress Note  Patient ID: Brandy [name], MRN: [id number]   Date: 05/09/23  Time Spent: 12:05 pm - 1:01  pm : 56 Minutes  Treatment Type: Individual Therapy.  Reported Symptoms: depression and anxiety.  Mental Status Exam: Appearance:  Casual     Behavior: Appropriate  Motor: Normal  Speech/Language:  Clear and Coherent  Affect: Tearful  Mood: dysthymic  Thought process: normal  Thought content:   WNL  Sensory/Perceptual disturbances:   WNL  Orientation: oriented to person, place, time/date, and situation  Attention: Good  Concentration: Good  Memory: WNL  Fund of knowledge:  Good  Insight:   Good  Judgment:  Good  Impulse Control: Good   Risk Assessment: Danger to Self:  No Self-injurious Behavior: No Danger to Others: No Duty to Warn:no Physical Aggression / Violence:No  Access to Firearms a concern: No  Gang Involvement:No   Subjective:   Brandy [name] participated from home, via video, is aware of limitations, and consented to treatment. Therapist participated from home office. Brandy [name] reviewed the events of the past week. She noted feeling a disconnection from her grand daughter due to various factors. We worked on processing her experience. She noted feelings of hurt due to the lack of contact despite of her attempts. She noted feeling lost as to how to proceed. We explored this, the dynamic, and the history of this strain. She noted wanting an answer for this. She was uncertain on how often she should attempt to reach out to her grand daughter. We worked on exploring this during the session and her feelings when people do not reciprocate. We worked on identifying contributing factors include her daughter's influence on the situation, her daughter's confrontational nature, and how she feels when her grand-daughter does not respond. Brandy [name] that she is working on being accepting of other peoples boundaries,  specifically her daughter, despite her boundaries often being unclear and unreasonable.  We worked on highlighting areas of control and lack of control during the session.  Therapist praised Brandy [name] for her effort to be accepting other people's boundaries.  However, therapist [name] the importance of,balancing peoples needs with our own. We worked on highlighting areas of control and lack of control. We discussed identifying areas of acceptance and how to manage distress and will continue to going forward. Brandy [name] was engaged and motivated during the session.  She expressed movement towards her goals.  Therapist praised Brandy [name] for effort and energy during the session.  Follow-up was scheduled for continued treatment, which she benefits from.   Interventions:  CBT & interpersonal.   Diagnosis:  Episode of recurrent major depressive disorder, unspecified depression episode severity [facility])  Psychiatric Treatment: Yes , Brandy Drone, MD.     Treatment Plan:  [name], self-aware, and motivated for change.   Support System: Family and friends.    Client Treatment Preferences Outpatient therapy.   Client Statement of Needs Brandy [name] to focus on self-care, eating healthfully, engaging in consistent exercise, focusing on sense of self,  being more organized and goal oriented, process past events including childhood, reduce negative self-talk.   Treatment Level Weekly  Symptoms  Depression: loss of interest, feeling down, poor sleep, lethargy, fluctuating appetite, feeling bad about self, and trouble concentrating.    (Status: maintained) Anxiety:  feeling anxious, difficulty managing worry, worrying about different things, trouble relaxing, restlessness, and feeling afraid as if something awful might happen.   (Status: maintained)  Goals:  Brandy [name] experiences symptoms of depression and anxiety.    Target Date: 07/19/23 Frequency: Weekly  Progress: 0  Modality: individual    Therapist will provide referrals for additional resources as appropriate.  Therapist will provide psycho-education regarding Brandy [name]'s diagnosis and corresponding treatment approaches and interventions. Licensed Clinical Social Worker, [place], LCSW will support the patient's ability to achieve the goals identified. will employ CBT, BA, Problem-solving, Solution Focused, Mindfulness,  coping skills, & other evidenced-based practices will be used to promote progress towards healthy functioning to help manage decrease symptoms associated with her diagnosis.   Reduce overall level, frequency, and intensity of the feelings of depression & anxiety and evidenced by decreased overall symptoms  from 6 to 7 days/week to 0 to 1 days/week per client report for at least 3 consecutive months. Verbally express understanding of the relationship between feelings of depression, anxiety and their impact on thinking patterns and behaviors. Verbalize an understanding of the role that distorted thinking plays in creating fears, excessive worry, and ruminations.    Brandy [name] participated in the creation of the treatment plan)  Brandy Mullet, LCSW

## 2023-05-16 ENCOUNTER — Ambulatory Visit: Payer: HMO | Admitting: Psychology

## 2023-05-23 ENCOUNTER — Ambulatory Visit: Payer: HMO | Admitting: Psychology

## 2023-05-23 DIAGNOSIS — F339 Major depressive disorder, recurrent, unspecified: Secondary | ICD-10-CM | POA: Diagnosis not present

## 2023-05-23 NOTE — Progress Notes (Signed)
[facility] Counselor/Therapist Progress Note  Patient ID: Brandy [name], MRN: [id number]   Date: 05/23/23  Time Spent: 12:03 pm - 12:43  pm : 40 Minutes  Treatment Type: Individual Therapy.  Reported Symptoms: depression and anxiety.  Mental Status Exam: Appearance:  Casual     Behavior: Appropriate  Motor: Normal  Speech/Language:  Clear and Coherent  Affect: Congruent  Mood: dysthymic  Thought process: normal  Thought content:   WNL  Sensory/Perceptual disturbances:   WNL  Orientation: oriented to person, place, time/date, and situation  Attention: Good  Concentration: Good  Memory: WNL  Fund of knowledge:  Good  Insight:   Good  Judgment:  Good  Impulse Control: Good   Risk Assessment: Danger to Self:  No Self-injurious Behavior: No Danger to Others: No Duty to Warn:no Physical Aggression / Violence:No  Access to Firearms a concern: No  Gang Involvement:No   Subjective:   Brandy [name] participated from home, via video, is aware of limitations, and consented to treatment. Therapist participated from home office. Brandy [name] reviewed the events of the past week. She noted recently being diagnosed with the flu and noted feeling terribly. She noted feelings of helplessness about feeling bad.  She noted a recent rise in stress when speaking with her father over the phone and noted her attempts to set boundaries and her father's lack of adherence to the boundaries. We  worked on processing her feelings regarding these stressors and ways to reiterate boundaries. She noted her continued attempts to maintain connection with granddaughter and noted this being difficult due to a lack of response from her grand-daughter or daughter. She noted having to reconcile her feelings regarding this while attempting to maintaining contact. We continue to process this during the session and Brandy [name] worry about her granddaughter's social development. Brandy [name] noted her attempts to be  mindful of where she has say. She noted her effort to manage her stress regarding the political atmosphere. She noted her effort to do "enough to be aware". She noted thinking about how to positively affect this. Therapist praised Brandy [name] for her boundary setting for self in this space. Therapist validated Brandy [name]'s feelings and experience during the session.    Interventions:  CBT & interpersonal.   Diagnosis:  Episode of recurrent major depressive disorder, unspecified depression episode severity [facility])  Psychiatric Treatment: Yes , [name], MD.     Treatment Plan:  [name], self-aware, and motivated for change.   Support System: Family and friends.    Client Treatment Preferences Outpatient therapy.   Client Statement of Needs Brandy [name] would like to focus on self-care, eating healthfully, engaging in consistent exercise, focusing on sense of self,  being more organized and goal oriented, process past events including childhood, reduce negative self-talk.   Treatment Level Weekly  Symptoms  Depression: loss of interest, feeling down, poor sleep, lethargy, fluctuating appetite, feeling bad about self, and trouble concentrating.    (Status: maintained) Anxiety:  feeling anxious, difficulty managing worry, worrying about different things, trouble relaxing, restlessness, and feeling afraid as if something awful might happen.   (Status: maintained)  Goals:   Brandy [name] experiences symptoms of depression and anxiety.    Target Date: 07/19/23 Frequency: Weekly  Progress: 0 Modality: individual    Therapist will provide referrals for additional resources as appropriate.  Therapist will provide psycho-education regarding Brandy [name]'s diagnosis and corresponding treatment approaches and interventions. Licensed Clinical Social Worker, [place], LCSW will support the patient's ability to  achieve the goals identified. will employ CBT, BA, Problem-solving,  Solution Focused, Mindfulness,  coping skills, & other evidenced-based practices will be used to promote progress towards healthy functioning to help manage decrease symptoms associated with her diagnosis.   Reduce overall level, frequency, and intensity of the feelings of depression & anxiety and evidenced by decreased overall symptoms  from 6 to 7 days/week to 0 to 1 days/week per client report for at least 3 consecutive months. Verbally express understanding of the relationship between feelings of depression, anxiety and their impact on thinking patterns and behaviors. Verbalize an understanding of the role that distorted thinking plays in creating fears, excessive worry, and ruminations.    Brandy [name] participated in the creation of the treatment plan)  [name], LCSW

## 2023-05-30 ENCOUNTER — Ambulatory Visit: Payer: HMO | Admitting: Psychology

## 2023-05-30 DIAGNOSIS — F339 Major depressive disorder, recurrent, unspecified: Secondary | ICD-10-CM

## 2023-05-30 NOTE — Progress Notes (Signed)
[facility] Counselor/Therapist Progress Note  Patient ID: Brandy [name], MRN: [id number]   Date: 05/30/23  Time Spent: 12:05 pm - 1:00 pm : 55 Minutes  Treatment Type: Individual Therapy.  Reported Symptoms: depression and anxiety.  Mental Status Exam: Appearance:  Casual     Behavior: Appropriate  Motor: Normal  Speech/Language:  Clear and Coherent  Affect: Congruent  Mood: dysthymic  Thought process: normal  Thought content:   WNL  Sensory/Perceptual disturbances:   WNL  Orientation: oriented to person, place, time/date, and situation  Attention: Good  Concentration: Good  Memory: WNL  Fund of knowledge:  Good  Insight:   Good  Judgment:  Good  Impulse Control: Good   Risk Assessment: Danger to Self:  No Self-injurious Behavior: No Danger to Others: No Duty to Warn:no Physical Aggression / Violence:No  Access to Firearms a concern: No  Gang Involvement:No   Subjective:   Brandy [name] participated from home, via video, is aware of limitations, and consented to treatment. Therapist participated from home office. Brandy [name] reviewed the events of the past week. She noted interest in an outdoor hobby, gardening but noted not having begun this. We worked on identifying possible barriers. She noted being solely responsible for most of the household chores and that if "I didn't do it, no one would". We explored this during the session and her husband's reluctance to engage in tasks that benefit the household. She noted that her husband often often does not complete a task or do it correctly. We worked on exploring this during the session and how she deals with her frustration. She noted recently setting boundaries with her son. Therapist noted Brandy [name]'s permeable boundary and the mixed messages. 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 Therapist modeled this during the session. Brandy [name] was engaged and receptive during the session. Therapist  praised Brandy [name] for her effort and energy during the session. Therapist validated Brandy [name] for her effort during the session.    Interventions:  CBT & interpersonal.   Diagnosis:  Episode of recurrent major depressive disorder, unspecified depression episode severity [facility])  Psychiatric Treatment: Yes , Brandy Section, Brandy [name].     Treatment Plan:  Client Abilities/Strengths Lynna is intelligent, self-aware, and motivated for change.   Support System: Family and friends.    Client Treatment Preferences Outpatient therapy.   Client Statement of Needs Lanessa would like to focus on self-care, eating healthfully, engaging in consistent exercise, focusing on sense of self,  being more organized and goal oriented, process past events including childhood, reduce negative self-talk.   Treatment Level Weekly  Symptoms  Depression: loss of interest, feeling down, poor sleep, lethargy, fluctuating appetite, feeling bad about self, and trouble concentrating.    (Status: maintained) Anxiety:  feeling anxious, difficulty managing worry, worrying about different things, trouble relaxing, restlessness, and feeling afraid as if something awful might happen.   (Status: maintained)  Goals:   Rin experiences symptoms of depression and anxiety.    Target Date: 07/19/23 Frequency: Weekly  Progress: 0 Modality: individual    Therapist will provide referrals for additional resources as appropriate.  Therapist will provide psycho-education regarding Kjersti's diagnosis and corresponding treatment approaches and interventions. Licensed Clinical Social Worker, [place], LCSW will support the patient's ability to achieve the goals identified. will employ CBT, BA, Problem-solving, Solution Focused, Mindfulness,  coping skills, & other evidenced-based practices will be used to promote progress towards healthy functioning to help manage decrease symptoms associated with her diagnosis.  Reduce overall level, frequency,  and intensity of the feelings of depression & anxiety and evidenced by decreased overall symptoms  from 6 to 7 days/week to 0 to 1 days/week per client report for at least 3 consecutive months. Verbally express understanding of the relationship between feelings of depression, anxiety and their impact on thinking patterns and behaviors. Verbalize an understanding of the role that distorted thinking plays in creating fears, excessive worry, and ruminations.    Brandy [name] participated in the creation of the treatment plan)  Brandy Ovens, LCSW

## 2023-06-05 ENCOUNTER — Encounter: Payer: Self-pay | Admitting: Family Medicine

## 2023-06-06 ENCOUNTER — Other Ambulatory Visit: Payer: Self-pay | Admitting: Primary Care

## 2023-06-06 ENCOUNTER — Ambulatory Visit: Payer: HMO | Admitting: Psychology

## 2023-06-06 DIAGNOSIS — Z1231 Encounter for screening mammogram for malignant neoplasm of breast: Secondary | ICD-10-CM

## 2023-06-06 DIAGNOSIS — F339 Major depressive disorder, recurrent, unspecified: Secondary | ICD-10-CM

## 2023-06-06 NOTE — Telephone Encounter (Signed)
 Brandy [name], can you schedule both this patient and her son Brandy [name], our patient is well, further CPEs?  I would like to have them back to back at the end of the day.  For example, the 3 PM slot and 3:20 PM slot.

## 2023-06-06 NOTE — Telephone Encounter (Signed)
 Unable to reach patient. Left voicemail to return call to our office.

## 2023-06-06 NOTE — Progress Notes (Addendum)
 [facility] Counselor/Therapist Progress Note  Patient ID: Brandy [name], MRN: [id number]   Date: 06/06/23  Time Spent: 12:08 pm - 12:59 pm : 51 Minutes  Treatment Type: Individual Therapy.  Reported Symptoms: depression and anxiety.  Mental Status Exam: Appearance:  Casual     Behavior: Appropriate  Motor: Normal  Speech/Language:  Clear and Coherent  Affect: Congruent  Mood: dysthymic  Thought process: normal  Thought content:   WNL  Sensory/Perceptual disturbances:   WNL  Orientation: oriented to person, place, time/date, and situation  Attention: Good  Concentration: Good  Memory: WNL  Fund of knowledge:  Good  Insight:   Good  Judgment:  Good  Impulse Control: Good   Risk Assessment: Danger to Self:  No Self-injurious Behavior: No Danger to Others: No Duty to Warn:no Physical Aggression / Violence:No  Access to Firearms a concern: No  Gang Involvement:No   Subjective:   Brandy [name] participated from home, via video, is aware of limitations, and consented to treatment. Therapist participated from home office. [place] reviewed the events of the past week. She noted some improvement in communication with her grand daughter. She noted being hopeful that this is an indication of increased communication. She noted continued stressors regarding politics and her attempts to set boundaries with others. She noted that with those closest to her that their statements push against morals, decency, and humanitarianism, it hurts me to the core. She noted making a comment to her friend, in January, that she knew would be activating. We worked on explored this during the session. Therapist highlighted cognitive dissonance and challenged this during the session. We identified an insight during the session, [name] noted her frustration regarding her inability to influence her friend. We worked on identifying ways to communicate feedback and concerns constructively. She  noted her intent to consider this but was largely resistant to this during the session. Therapist praised [name] for her efforts to consider a different perspective and her intent to further process. We worked on processing the pros and cons of her approach with those who disagrees with her political stance. We continued to explore this during the session. Therapist encouraged flexibility and working to identify commonalities despite the differences. Therapist provided supportive therapy.   Interventions:  CBT & interpersonal.   Diagnosis:  Episode of recurrent major depressive disorder, unspecified depression episode severity [facility])  Psychiatric Treatment: Yes , [name], MD.     Treatment Plan:  Client Abilities/Strengths Deb is intelligent, self-aware, and motivated for change.   Support System: Family and friends.    Client Treatment Preferences Outpatient therapy.   Client Statement of Needs Brandy [name] would like to focus on self-care, eating healthfully, engaging in consistent exercise, focusing on sense of self,  being more organized and goal oriented, process past events including childhood, reduce negative self-talk.   Treatment Level Weekly  Symptoms  Depression: loss of interest, feeling down, poor sleep, lethargy, fluctuating appetite, feeling bad about self, and trouble concentrating.    (Status: maintained) Anxiety:  feeling anxious, difficulty managing worry, worrying about different things, trouble relaxing, restlessness, and feeling afraid as if something awful might happen.   (Status: maintained)  Goals:   Rolanda experiences symptoms of depression and anxiety.    Target Date: 07/19/23 Frequency: Weekly  Progress: 0 Modality: individual    Therapist will provide referrals for additional resources as appropriate.  Therapist will provide psycho-education regarding Adin's diagnosis and corresponding treatment approaches and interventions. Licensed Equities Trader,  [name], LCSW will support the patient's ability to achieve the goals identified. will employ CBT, BA, Problem-solving, Solution Focused, Mindfulness,  coping skills, & other evidenced-based practices will be used to promote progress towards healthy functioning to help manage decrease symptoms associated with her diagnosis.   Reduce overall level, frequency, and intensity of the feelings of depression & anxiety and evidenced by decreased overall symptoms  from 6 to 7 days/week to 0 to 1 days/week per client report for at least 3 consecutive months. Verbally express understanding of the relationship between feelings of depression, anxiety and their impact on thinking patterns and behaviors. Verbalize an understanding of the role that distorted thinking plays in creating fears, excessive worry, and ruminations.    [name] participated in the creation of the treatment plan)  [name], LCSW

## 2023-06-07 ENCOUNTER — Ambulatory Visit (INDEPENDENT_AMBULATORY_CARE_PROVIDER_SITE_OTHER): Payer: HMO

## 2023-06-07 VITALS — Ht 70.75 in | Wt 246.0 lb

## 2023-06-07 DIAGNOSIS — Z1211 Encounter for screening for malignant neoplasm of colon: Secondary | ICD-10-CM | POA: Diagnosis not present

## 2023-06-07 DIAGNOSIS — Z0000 Encounter for general adult medical examination without abnormal findings: Secondary | ICD-10-CM | POA: Diagnosis not present

## 2023-06-07 NOTE — Telephone Encounter (Signed)
 Spoke with patient and relayed message below to her. Scheduled her and her son Norman on March 21 at 3 and 3:20 to see Mallie Gaskins for there annual physicals.   Reason for CRM: Brandy [name] is returning [facility] call. Please call her back. I tried to help but Cristalle only wanted to speak with [facility]. Thanks

## 2023-06-07 NOTE — Progress Notes (Signed)
 Subjective:   Brandy [name] is a 58 y.o. female who presents for an Initial Medicare Annual Wellness Visit.  Visit Complete: Virtual I connected with  Brandy [name] on 06/07/23 by a audio enabled telemedicine application and verified that I am speaking with the correct person using two identifiers.  Patient Location: Home  Provider Location: Office/Clinic  I discussed the limitations of evaluation and management by telemedicine. The patient expressed understanding and agreed to proceed.  Vital Signs: Because this visit was a virtual/telehealth visit, some criteria may be missing or patient reported. Any vitals not documented were not able to be obtained and vitals that have been documented are patient reported.  Patient Medicare AWV questionnaire was completed by the patient on (not done); I have confirmed that all information answered by patient is correct and no changes since this date.  Cardiac Risk Factors include: advanced age (>32men, >71 women);dyslipidemia;hypertension;obesity (BMI >30kg/m2)     Objective:    Today's Vitals   06/07/23 1415 06/07/23 1416  Weight: 246 lb (111.6 kg)   Height: 5' 10.75 (1.797 m)   PainSc:  5    Body mass index is 34.55 kg/m.     06/07/2023    2:36 PM 07/25/2022    3:12 PM 08/14/2021   11:21 AM 04/04/2021   11:00 PM 03/20/2021   11:22 AM 01/09/2021   11:29 AM 04/01/2019    2:03 PM  Advanced Directives  Does Patient Have a Medical Advance Directive? No No No No No No No  Would patient like information on creating a medical advance directive?  No - Patient declined No - Patient declined No - Patient declined No - Patient declined Yes (MAU/Ambulatory/Procedural Areas - Information given) No - Patient declined    Current Medications (verified) Outpatient Encounter Medications as of 06/07/2023  Medication Sig   acetaminophen  (TYLENOL ) 650 MG CR tablet Take 1,300 mg by mouth every 8 (eight) hours as needed for pain.   atorvastatin   (LIPITOR) 10 MG tablet TAKE 1 TABLET BY MOUTH EVERY DAY FOR CHOLESTEROL   Blood Glucose Monitoring Suppl (ONETOUCH VERIO REFLECT) w/Device KIT 3 (three) times daily.   Blood Glucose Monitoring Suppl DEVI 1 each by Does not apply route in the morning, at noon, and at bedtime. May substitute to any manufacturer covered by patient's insurance.   cetirizine  (ZYRTEC ) 10 MG tablet TAKE 1 TABLET (10 MG TOTAL) BY MOUTH DAILY. FOR ALLERGIES   citalopram  (CELEXA ) 40 MG tablet Take 40 mg by mouth daily.   DULoxetine  (CYMBALTA ) 30 MG capsule Take 30 mg by mouth daily. Take with 60 mg to equal 90 mg daily   DULoxetine  (CYMBALTA ) 60 MG capsule Take 60 mg by mouth daily. Take with 30 mg to equal 90 mg daily   folic acid  (FOLVITE ) 1 MG tablet Take 1 mg by mouth daily.   gabapentin  (NEURONTIN ) 300 MG capsule Take 1 capsule (300 mg total) by mouth 2 (two) times daily. For back pain   glucose blood (ONETOUCH VERIO) test strip TEST BLOOD SUGAR MORNING, NOON AND AT BEDTIME   lamoTRIgine  (LAMICTAL ) 100 MG tablet Take 100 mg by mouth every evening.   lamoTRIgine  (LAMICTAL ) 200 MG tablet Take 200 mg by mouth in the morning.   metroNIDAZOLE (METROCREAM) 0.75 % cream Apply 1 application topically daily as needed (rosacea).   Multiple Minerals-Vitamins (CALCIUM -MAGNESIUM-ZINC-D3) TABS Take 2 tablets by mouth daily.   ondansetron  (ZOFRAN -ODT) 4 MG disintegrating tablet Dissolve 1 tablet (4 mg total) by mouth every 6 (  six) hours as needed for nausea or vomiting.   pantoprazole  (PROTONIX ) 40 MG tablet Take 1 tablet (40 mg total) by mouth daily.   Vitamin D , Ergocalciferol , (DRISDOL ) 1.25 MG (50000 UNIT) CAPS capsule TAKE 1 CAPSULE BY MOUTH ONCE WEEKLY FOR VITAMIN D .   No facility-administered encounter medications on file as of 06/07/2023.    Allergies (verified) Diphenhydramine hcl and Penicillins   History: Past Medical History:  Diagnosis Date   Anxiety    Arthritis    Back pain    Brain fog    CHF (congestive  heart failure) (HCC)    pt not aware of this   Depression    Diabetes mellitus without complication (HCC)    Difficulty walking    Dysmetabolic syndrome X    Fatigue    Fibromyalgia    neuropathy   Hypertension    Hypertriglyceridemia    Joint pain    Leg weakness    Lower extremity edema    Muscle atrophy    Neuropathy    Obesity    Osteoarthritis    PONV (postoperative nausea and vomiting)    Poor memory    Post-menopausal bleeding    Prediabetes    Rosacea    Sleep apnea    not on cpap yet   Tetanus vaccine causing adverse effect in therapeutic use    Vitamin D  deficiency    Past Surgical History:  Procedure Laterality Date   BONE TUMOR EXCISION  1975   Right leg   DILATATION & CURETTAGE/HYSTEROSCOPY WITH MYOSURE N/A 10/07/2019   Procedure: DILATATION AND CURETTAGE /HYSTEROSCOPY, Myosure, Polypectomy;  Surgeon: Izell Harari, MD;  Location: [facility];  Service: Gynecology;  Laterality: N/A;   ENDOMETRIAL BIOPSY  07/14/2019       epidural steroid injection     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   Procedure: LAPAROSCOPIC ROUX-EN-Y GASTRIC BYPASS WITH UPPER ENDOSCOPY;  Surgeon: Kinsinger, Herlene Righter, MD;  Location: [facility];  Service: General;  Laterality: N/A;   Family History  Problem Relation Age of Onset   Rheum arthritis Mother    COPD Mother    Stroke Mother    Depression Mother    Anxiety disorder Mother    Eating disorder Mother    Obesity Mother    Hypertension Father    Depression Father    Alcoholism Father    Heart disease Maternal Grandmother    Stroke Maternal Grandmother    Alcohol abuse Maternal Grandmother    Alcohol abuse Maternal Grandfather    Aneurysm Paternal Grandmother    Autism Son        severe   Diabetes Paternal Aunt    Breast cancer Neg Hx    Social History   Socioeconomic History   Marital status: Married    Spouse name: Ed   Number of children: 3   Years of education: Not on file   Highest education level: Associate degree:  academic program  Occupational History   Occupation: Immunologist: [employer]  Tobacco Use   Smoking status: Never   Smokeless tobacco: Never  Vaping Use   Vaping status: Never Used  Substance and Sexual Activity   Alcohol use: Not Currently    Comment: rarely - once a year   Drug use: No   Sexual activity: Not Currently    Birth control/protection: Post-menopausal  Other Topics Concern   Not on file  Social History Narrative   Best boy  Married; 3 kids (middle child-severely autistic)      No regular exercise   Social Drivers of Health   Financial Resource Strain: Medium Risk (06/07/2023)   Overall Financial Resource Strain (CARDIA)    Difficulty of Paying Living Expenses: Somewhat hard  Food Insecurity: Food Insecurity Present (06/07/2023)   Hunger Vital Sign    Worried About Running Out of Food in the Last Year: Sometimes true    Ran Out of Food in the Last Year: Sometimes true  Transportation Needs: No Transportation Needs (06/07/2023)   PRAPARE - Administrator, Civil Service (Medical): No    Lack of Transportation (Non-Medical): No  Physical Activity: Insufficiently Active (12/04/2022)   Exercise Vital Sign    Days of Exercise per Week: 1 day    Minutes of Exercise per Session: 60 min  Stress: Stress Concern Present (06/07/2023)   [employer] of Occupational Health - Occupational Stress Questionnaire    Feeling of Stress : Rather much  Social Connections: Moderately Integrated (06/07/2023)   Social Connection and Isolation Panel [NHANES]    Frequency of Communication with Friends and Family: More than three times a week    Frequency of Social Gatherings with Friends and Family: Never    Attends Religious Services: Never    Database Administrator or Organizations: Yes    Attends Engineer, Structural: More than 4 times per year    Marital Status: Married    Tobacco Counseling Counseling  given: Not Answered  Clinical Intake:  Pre-visit preparation completed: Yes  Pain : 0-10 Pain Score: 5  Pain Type: Chronic pain Pain Location: Generalized (rt arm shoulder 7.5 in pain) Pain Orientation: Right Pain Descriptors / Indicators: Aching Pain Onset: More than a month ago Pain Frequency: Constant Pain Relieving Factors: pain medications, stretches, PT exercises  Pain Relieving Factors: pain medications, stretches, PT exercises  BMI - recorded: 34.55 Nutritional Status: BMI > 30  Obese Nutritional Risks: None Diabetes: No  How often do you need to have someone help you when you read instructions, pamphlets, or other written materials from your doctor or pharmacy?: 1 - Never  Interpreter Needed?: No  Comments: lives with husband Information entered by :: B.Marquice Uddin,LPN   Activities of Daily Living    06/07/2023    2:36 PM  In your present state of health, do you have any difficulty performing the following activities:  Hearing? 0  Vision? 0  Difficulty concentrating or making decisions? 1  Walking or climbing stairs? 0  Dressing or bathing? 0  Doing errands, shopping? 0  Preparing Food and eating ? N  Using the Toilet? N  In the past six months, have you accidently leaked urine? N  Do you have problems with loss of bowel control? N  Managing your Medications? N  Managing your Finances? N  Housekeeping or managing your Housekeeping? N    Patient Care Team: Gretta Comer POUR, NP as PCP - General (Internal Medicine) Perla Evalene PARAS, MD as PCP - Cardiology (Cardiology) Chipper Viviane POUR, MD as Referring Physician (Psychiatry) Estelle Fallow [facility])  Indicate any recent Medical Services you may have received from other than [facility] providers in the past year (date may be approximate).     Assessment:   This is a routine wellness examination for Brandy [name].  Hearing/Vision screen Hearing Screening - Comments:: Pt says her hearing is good Vision Screening  - Comments:: Pt says her vision is fair w/glasses Dr Estelle   Goals Addressed  This Visit's Progress    Patient Stated       Pt would like to get under 200lbs and lessening pain. I want to keep moving and continue my therapy       Depression Screen    06/07/2023    2:25 PM 11/22/2022    2:48 PM 07/05/2022   12:13 PM 07/03/2022   11:05 AM 11/21/2021   11:22 AM 11/13/2021    4:35 PM 10/27/2021   11:13 AM  PHQ 2/9 Scores  PHQ - 2 Score 3 3  3 4 2 2   PHQ- 9 Score 17 16  17 15 15 13      Information is confidential and restricted. Go to Review Flowsheets to unlock data.    Fall Risk    06/07/2023    2:21 PM 07/03/2022   11:04 AM 08/14/2021   11:21 AM 01/09/2021   11:30 AM 04/20/2019    6:42 PM  Fall Risk   Falls in the past year? 0 1 1 0 0  Number falls in past yr: 0 0 0    Injury with Fall? 0 0 0    Risk for fall due to : No Fall Risks History of fall(s) No Fall Risks Impaired balance/gait   Follow up Education provided;Falls prevention discussed Falls evaluation completed Falls evaluation completed;Education provided;Falls prevention discussed Falls prevention discussed     MEDICARE RISK AT HOME: Medicare Risk at Home Any stairs in or around the home?: Yes If so, are there any without handrails?: Yes Home free of loose throw rugs in walkways, pet beds, electrical cords, etc?: Yes Adequate lighting in your home to reduce risk of falls?: Yes Life alert?: No Use of a cane, walker or w/c?: No Grab bars in the bathroom?: No Shower chair or bench in shower?: Yes Elevated toilet seat or a handicapped toilet?: No  TIMED UP AND GO:  Was the test performed? No    Cognitive Function:      06/23/2019    1:04 PM  Montreal Cognitive Assessment   Visuospatial/ Executive (0/5) 5  Naming (0/3) 3  Attention: Read list of digits (0/2) 2  Attention: Read list of letters (0/1) 1  Attention: Serial 7 subtraction starting at 100 (0/3) 3  Language: Repeat phrase (0/2) 2   Language : Fluency (0/1) 1  Abstraction (0/2) 2  Delayed Recall (0/5) 3  Orientation (0/6) 5  Total 27      06/07/2023    2:40 PM  6CIT Screen  What Year? 0 points  What month? 0 points  What time? 0 points  Count back from 20 0 points  Months in reverse 0 points  Repeat phrase 0 points  Total Score 0 points    Immunizations Immunization History  Administered Date(s) Administered   Influenza,inj,Quad PF,6+ Mos 02/18/2014, 03/18/2019, 02/08/2020   PFIZER(Purple Top)SARS-COV-2 Vaccination 08/28/2019, 09/27/2019   Pneumococcal Polysaccharide-23 12/10/2018   Td 10/04/2009    TDAP status: Up to date  Flu Vaccine status: Declined, Education has been provided regarding the importance of this vaccine but patient still declined. Advised may receive this vaccine at local pharmacy or Health Dept. Aware to provide a copy of the vaccination record if obtained from local pharmacy or Health Dept. Verbalized acceptance and understanding.  Covid-19 vaccine status: Completed vaccines  Qualifies for Shingles Vaccine? Yes   Zostavax completed No   Shingrix Completed?: No.    Education has been provided regarding the importance of this vaccine. Patient has been advised to call  insurance company to determine out of pocket expense if they have not yet received this vaccine. Advised may also receive vaccine at local pharmacy or Health Dept. Verbalized acceptance and understanding.  Screening Tests Health Maintenance  Topic Date Due   Zoster Vaccines- Shingrix (1 of 2) Never done   Pneumococcal Vaccine 91-39 Years old (2 of 2 - PCV) 12/10/2019   FOOT EXAM  06/01/2021   OPHTHALMOLOGY EXAM  10/12/2022   INFLUENZA VACCINE  11/29/2022   COVID-19 Vaccine (3 - 2024-25 season) 12/30/2022   HEMOGLOBIN A1C  06/06/2023   Diabetic kidney evaluation - Urine ACR  07/03/2023   Colonoscopy  07/03/2023 (Originally 07/04/2008)   Diabetic kidney evaluation - eGFR measurement  12/04/2023   Medicare Annual  Wellness (AWV)  06/06/2024   Cervical Cancer Screening (HPV/Pap Cotest)  07/13/2024   MAMMOGRAM  07/19/2024   Hepatitis C Screening  Completed   HIV Screening  Completed   HPV VACCINES  Aged Out   DTaP/Tdap/Td  Discontinued    Health Maintenance  Health Maintenance Due  Topic Date Due   Zoster Vaccines- Shingrix (1 of 2) Never done   Pneumococcal Vaccine 37-63 Years old (2 of 2 - PCV) 12/10/2019   FOOT EXAM  06/01/2021   OPHTHALMOLOGY EXAM  10/12/2022   INFLUENZA VACCINE  11/29/2022   COVID-19 Vaccine (3 - 2024-25 season) 12/30/2022   HEMOGLOBIN A1C  06/06/2023   Diabetic kidney evaluation - Urine ACR  07/03/2023    Colorectal cancer screening: Type of screening: Cologuard. Completed 07/06/2020. Repeat every 3 years  Mammogram status: Completed yes scheduled in March. Repeat every year  Lung Cancer Screening: (Low Dose CT Chest recommended if Age 78-80 years, 20 pack-year currently smoking OR have quit w/in 15years.) does not qualify.   Lung Cancer Screening Referral: no  Additional Screening:  Hepatitis C Screening: does not qualify; Completed 02/08/2020  Vision Screening: Recommended annual ophthalmology exams for early detection of glaucoma and other disorders of the eye. Is the patient up to date with their annual eye exam?  Yes  Who is the provider or what is the name of the office in which the patient attends annual eye exams? Dr Estelle If pt is not established with a provider, would they like to be referred to a provider to establish care? No .   Dental Screening: Recommended annual dental exams for proper oral hygiene  Diabetic Foot Exam: n/a  Community Resource Referral / Chronic Care Management: CRR required this visit?  No   CCM required this visit?  No     Plan:     I have personally reviewed and noted the following in the patient's chart:   Medical and social history Use of alcohol, tobacco or illicit drugs  Current medications and supplements  including opioid prescriptions. Patient is not currently taking opioid prescriptions. Functional ability and status Nutritional status Physical activity Advanced directives List of other physicians Hospitalizations, surgeries, and ER visits in previous 12 months Vitals Screenings to include cognitive, depression, and falls Referrals and appointments  In addition, I have reviewed and discussed with patient certain preventive protocols, quality metrics, and best practice recommendations. A written personalized care plan for preventive services as well as general preventive health recommendations were provided to patient.    Erminio LITTIE Saris, LPN   10/29/7972   After Visit Summary: (MyChart) Due to this being a telephonic visit, the after visit summary with patients personalized plan was offered to patient via MyChart   Nurse Notes: Pt  relays her struggle with constant pain (some days better than others). She is on medication and therapy fir depression. She has no concerns or questions at this time.

## 2023-06-07 NOTE — Telephone Encounter (Signed)
 Unable to reach patient. Left voicemail to return call to our office.

## 2023-06-07 NOTE — Patient Instructions (Signed)
 Brandy [name] , Thank you for taking time to come for your Medicare Wellness Visit. I appreciate your ongoing commitment to your health goals. Please review the following plan we discussed and let me know if I can assist you in the future.   Referrals/Orders/Follow-Ups/Clinician Recommendations: none  This is a list of the screening recommended for you and due dates:  Health Maintenance  Topic Date Due   Zoster (Shingles) Vaccine (1 of 2) Never done   Pneumococcal Vaccination (2 of 2 - PCV) 12/10/2019   Complete foot exam   06/01/2021   Eye exam for diabetics  10/12/2022   Flu Shot  11/29/2022   COVID-19 Vaccine (3 - 2024-25 season) 12/30/2022   Hemoglobin A1C  06/06/2023   Yearly kidney health urinalysis for diabetes  07/03/2023   Colon Cancer Screening  07/03/2023*   Yearly kidney function blood test for diabetes  12/04/2023   Medicare Annual Wellness Visit  06/06/2024   Pap with HPV screening  07/13/2024   Mammogram  07/19/2024   Hepatitis C Screening  Completed   HIV Screening  Completed   HPV Vaccine  Aged Out   DTaP/Tdap/Td vaccine  Discontinued  *Topic was postponed. The date shown is not the original due date.    Advanced directives: (Declined) Advance directive discussed with you today. Even though you declined this today, please call our office should you change your mind, and we can give you the proper paperwork for you to fill out.  Next Medicare Annual Wellness Visit scheduled for next year: Yes 06/09/24 @ 2:20pm televisit

## 2023-06-11 NOTE — Progress Notes (Unsigned)
   Brandy Baiza T. Brandy Haydon, MD, CAQ Sports Medicine [facility] [address] [place], [address]  Phone: [phone]  FAX: [phone]  Brandy [name] - 58 y.o. female  MRN [id number]  Date of Birth: 1965/12/26  Date: 06/12/2023  PCP: Doreene Nest, NP  Referral: Doreene Nest, NP  No chief complaint on file.  Subjective:   Brandy [name] is a 58 y.o. very pleasant female patient with There is no height or weight on file to calculate BMI. who presents with the following:  Patient presents in follow-up for right-sided shoulder pain.  I saw her initially about a year ago and then again in July 2024 and felt like she had some impingement syndrome with rotator cuff tendinopathy.  At that point, she had been doing better and she had no contact with me until a recent MyChart message.  She endorses pain in multiple locations at this point.    Review of Systems is noted in the HPI, as appropriate  Objective:   LMP  (LMP Unknown)   GEN: No acute distress; alert,appropriate. PULM: Breathing comfortably in no respiratory distress PSYCH: Normally interactive.   Laboratory and Imaging Data:  Assessment and Plan:   ***

## 2023-06-12 ENCOUNTER — Ambulatory Visit: Payer: HMO | Admitting: Family Medicine

## 2023-06-12 VITALS — BP 100/60 | HR 61 | Temp 97.6°F | Ht 67.5 in | Wt 258.5 lb

## 2023-06-12 DIAGNOSIS — M25511 Pain in right shoulder: Secondary | ICD-10-CM | POA: Diagnosis not present

## 2023-06-12 DIAGNOSIS — R29898 Other symptoms and signs involving the musculoskeletal system: Secondary | ICD-10-CM | POA: Diagnosis not present

## 2023-06-12 DIAGNOSIS — M2550 Pain in unspecified joint: Secondary | ICD-10-CM | POA: Diagnosis not present

## 2023-06-12 DIAGNOSIS — M7581 Other shoulder lesions, right shoulder: Secondary | ICD-10-CM | POA: Diagnosis not present

## 2023-06-12 DIAGNOSIS — G8929 Other chronic pain: Secondary | ICD-10-CM | POA: Diagnosis not present

## 2023-06-12 DIAGNOSIS — M5412 Radiculopathy, cervical region: Secondary | ICD-10-CM

## 2023-06-12 DIAGNOSIS — M5442 Lumbago with sciatica, left side: Secondary | ICD-10-CM

## 2023-06-12 MED ORDER — GABAPENTIN 300 MG PO CAPS: 900.0000 mg | ORAL_CAPSULE | Freq: Three times a day (TID) | ORAL | 3 refills | Status: AC

## 2023-06-12 NOTE — Patient Instructions (Signed)
Generic Gabapentin Titration Schedule  Generic Gabapentin (generic form of Neurontin) comes in 300 mg tablets or capsules.   You have to titrate your dose slowly to reduce side effects and reduce sedation / sleepiness.    Week               Breakfast  Lunch   Dinner One                 0   0   300 mg Two   300mg    0   300mg  Three  300mg    300mg    300mg  Four   300mg    300mg    600mg  (2 tabs) Five   600mg  (2 tabs) 300mg    600mg  (2 tabs) Six   600mg  (2 tabs) 600mg  (2 tabs) 600mg  (2 tabs) Seven  600mg  (2 tabs) 600mg  (2 tabs) 900mg  (3 tabs) Eight   900mg  (3 tabs) 600mg  (2 tabs) 900mg  (3 tabs)  If you have any problems at any time, drop back to the previous dosing schedule. Continue with this dose for 1 week, and then try to go up the next step again.

## 2023-06-13 ENCOUNTER — Ambulatory Visit: Payer: HMO | Admitting: Psychology

## 2023-06-13 ENCOUNTER — Encounter: Payer: Self-pay | Admitting: Family Medicine

## 2023-06-13 DIAGNOSIS — F339 Major depressive disorder, recurrent, unspecified: Secondary | ICD-10-CM | POA: Diagnosis not present

## 2023-06-13 NOTE — Progress Notes (Signed)
Brandy [name]  Patient ID: Brandy [name], MRN: [id number]   Date: 06/13/23  Time Spent: 12:05 pm - 1:00 pm : 55 Minutes  Treatment Type: Individual Therapy.  Reported Symptoms: depression and anxiety.  Mental Status Exam: Appearance:  Casual     Behavior: Appropriate  Motor: Normal  Speech/Language:  Clear and Coherent  Affect: Congruent  Mood: dysthymic  Thought process: normal  Thought content:   WNL  Sensory/Perceptual disturbances:   WNL  Orientation: oriented to person, place, time/date, and situation  Attention: Good  Concentration: Good  Memory: WNL  Fund of knowledge:  Good  Insight:   Good  Judgment:  Good  Impulse Control: Good   Risk Assessment: Danger to Self:  No Self-injurious Behavior: No Danger to Others: No Duty to Warn:no Physical Aggression / Violence:No  Access to Firearms a concern: No  Gang Involvement:No   Subjective:   Brandy [name] participated from home, via video, is aware of limitations, and consented to treatment. Therapist participated from home office. Brandy [name] reviewed the events of the past week. Brandy [name] noted the "idea of sticking it to them" in relation to discussing politics is unbecoming. Brandy [name] noted not liking that. Brandy [name] noted when Brandy [name] is "like this" its often those who Brandy [name]'s closest to. Brandy [name] noted that there is a part of her that wants to be "this way" but not knowing "Why". Brandy [name] noted her friend often making digging comments. We worked on exploring this in relation to her on antagonistic responses towards her friend. Brandy [name] noted feeling as though her friend "thinks Brandy [name] is better than I am". Brandy [name] could not identify any evidence for this during the session. Therapist highlighted Brandy [name]'s jumps to conclusions. We explored this during the session.  Therapist highlighted that if Brandy [name] "can't deal" with what people are saying that Brandy [name] often will end the conversation or phone call. Brandy [name] noted that Brandy [name] often  reacts this way "when nothing will change". Brandy [name] noted growing up in a home with a parental figure, her mother, who was emotionally unstable. We worked on processing how her parents modeled conflict resolution when Brandy [name] was a child. Brandy [name] noted her parents having "horrible" fights. Brandy [name] noted her parents fighting often. Brandy [name] noted her parents getting married when they were young and having Brandy [name] when they were young, as well. Therapist praised Brandy [name] for her effort during the session. Therapist validated Brandy [name]'s feelings and experience and provided supportive therapy.   Interventions:  CBT & interpersonal.   Diagnosis:  Episode of recurrent major depressive disorder, unspecified depression episode severity [facility])  Psychiatric Treatment: Yes , Brandy Section, MD.     Treatment Plan:  [name], self-aware, and motivated for change.   Support System: Family and friends.    Client Treatment Preferences Outpatient therapy.   Client Statement of Needs Brandy [name] would like to focus on self-care, eating healthfully, engaging in consistent exercise, focusing on sense of self,  being more organized and goal oriented, process past events including childhood, reduce negative self-talk.   Treatment Level Weekly  Symptoms  Depression: loss of interest, feeling down, poor sleep, lethargy, fluctuating appetite, feeling bad about self, and trouble concentrating.    (Status: maintained) Anxiety:  feeling anxious, difficulty managing worry, worrying about different things, trouble relaxing, restlessness, and feeling afraid as if something awful might happen.   (Status: maintained)  Goals:   Brandy [name] experiences symptoms of depression and anxiety.    Target Date: 07/19/23 Frequency: Weekly  Progress: 15% Modality: individual    Therapist will provide referrals for additional resources as appropriate.  Therapist will provide psycho-education regarding Brandy [name]'s diagnosis and  corresponding treatment approaches and interventions. Licensed Clinical Social Worker, [place], LCSW will support the patient's ability to achieve the goals identified. will employ CBT, BA, Problem-solving, Solution Focused, Mindfulness,  coping skills, & other evidenced-based practices will be used to promote progress towards healthy functioning to help manage decrease symptoms associated with her diagnosis.   Reduce overall level, frequency, and intensity of the feelings of depression & anxiety and evidenced by decreased overall symptoms  from 6 to 7 days/week to 0 to 1 days/week per client report for at least 3 consecutive months. Verbally express understanding of the relationship between feelings of depression, anxiety and their impact on thinking patterns and behaviors. Verbalize an understanding of the role that distorted thinking plays in creating fears, excessive worry, and ruminations.    Brandy [name] participated in the creation of the treatment plan)  [name], LCSW

## 2023-06-14 ENCOUNTER — Encounter: Payer: Self-pay | Admitting: Primary Care

## 2023-06-20 ENCOUNTER — Ambulatory Visit: Payer: HMO | Admitting: Psychology

## 2023-06-20 DIAGNOSIS — F339 Major depressive disorder, recurrent, unspecified: Secondary | ICD-10-CM | POA: Diagnosis not present

## 2023-06-20 NOTE — Progress Notes (Signed)
 [facility] Counselor/Therapist Progress Note  Patient ID: Brandy [name], MRN: [id number]   Date: 06/20/23  Time Spent: 12:06 pm - 12:59pm : 53 Minutes  Treatment Type: Individual Therapy.  Reported Symptoms: depression and anxiety.  Mental Status Exam: Appearance:  Casual     Behavior: Appropriate  Motor: Normal  Speech/Language:  Clear and Coherent  Affect: Congruent  Mood: dysthymic  Thought process: normal  Thought content:   WNL  Sensory/Perceptual disturbances:   WNL  Orientation: oriented to person, place, time/date, and situation  Attention: Good  Concentration: Good  Memory: WNL  Fund of knowledge:  Good  Insight:   Good  Judgment:  Good  Impulse Control: Good   Risk Assessment: Danger to Self:  No Self-injurious Behavior: No Danger to Others: No Duty to Warn:no Physical Aggression / Violence:No  Access to Firearms a concern: No  Gang Involvement:No   Subjective:   Brandy [name] participated from home, via video, is aware of limitations, and consented to treatment. Therapist participated from home office. Brandy [name] reviewed the events of the past week. She noted receiving flowers from her husband but noted that they were in poor shape and that they were "discount flowers". She noted her self talk including "I am not even worth more than that". She noted difficulty with "how to deal and how to feel". We worked on exploring this during the session. She noted her attempts in the past to provide list, hints, and no action from ED. We explored this during the session. We worked on exploring her attempts to communicate needs with [name], int he past, and she discussed difficulty balancing a need to give feedback with appreciation. Therapist discussed ways to communicate needs, be more explicit, and being more direct. She noted trepidation that this would take away the gesture by being as direct. We worked on exploring this during the session. We discussed the  importance of using communication to address needs and the pros and cons of providing this feedback and not. We explored this during the session. She noted by this, the gesture would be less meaningful. We explored this during the session. Therapist encouraged Brandy [name] to explore this between sessions and work on flexibility.  Therapist praised Brandy [name] for her effort and provided supportive therapy.    Interventions:  CBT & interpersonal.   Diagnosis:  Episode of recurrent major depressive disorder, unspecified depression episode severity Brandy [name])  Psychiatric Treatment: Yes , [name], MD.     Treatment Plan:  [name], self-aware, and motivated for change.   Support System: Family and friends.    Client Treatment Preferences Outpatient therapy.   Client Statement of Needs Brandy [name] would like to focus on self-care, eating healthfully, engaging in consistent exercise, focusing on sense of self,  being more organized and goal oriented, process past events including childhood, reduce negative self-talk.   Treatment Level Weekly  Symptoms  Depression: loss of interest, feeling down, poor sleep, lethargy, fluctuating appetite, feeling bad about self, and trouble concentrating.    (Status: maintained) Anxiety:  feeling anxious, difficulty managing worry, worrying about different things, trouble relaxing, restlessness, and feeling afraid as if something awful might happen.   (Status: maintained)  Goals:   Brandy [name] experiences symptoms of depression and anxiety.    Target Date: 07/19/23 Frequency: Weekly  Progress: 15% Modality: individual    Therapist will provide referrals for additional resources as appropriate.  Therapist will provide psycho-education regarding Brandy [name]'s diagnosis and corresponding treatment approaches and  interventions. Licensed Clinical Social Worker, [place], LCSW will support the patient's ability to achieve the goals identified.  will employ CBT, BA, Problem-solving, Solution Focused, Mindfulness,  coping skills, & other evidenced-based practices will be used to promote progress towards healthy functioning to help manage decrease symptoms associated with her diagnosis.   Reduce overall level, frequency, and intensity of the feelings of depression & anxiety and evidenced by decreased overall symptoms  from 6 to 7 days/week to 0 to 1 days/week per client report for at least 3 consecutive months. Verbally express understanding of the relationship between feelings of depression, anxiety and their impact on thinking patterns and behaviors. Verbalize an understanding of the role that distorted thinking plays in creating fears, excessive worry, and ruminations.    Brandy [name] participated in the creation of the treatment plan)  Brandy Ovens, LCSW

## 2023-06-22 ENCOUNTER — Ambulatory Visit
Admission: RE | Admit: 2023-06-22 | Discharge: 2023-06-22 | Disposition: A | Discharge status: Home / Self Care | Payer: HMO | Source: Ambulatory Visit | Attending: Family Medicine | Admitting: Family Medicine

## 2023-06-22 DIAGNOSIS — M25511 Pain in right shoulder: Secondary | ICD-10-CM | POA: Diagnosis not present

## 2023-06-22 DIAGNOSIS — R29898 Other symptoms and signs involving the musculoskeletal system: Secondary | ICD-10-CM

## 2023-06-22 DIAGNOSIS — M7581 Other shoulder lesions, right shoulder: Secondary | ICD-10-CM

## 2023-06-22 DIAGNOSIS — G8929 Other chronic pain: Secondary | ICD-10-CM | POA: Diagnosis not present

## 2023-06-27 ENCOUNTER — Ambulatory Visit: Payer: HMO | Admitting: Psychology

## 2023-06-27 DIAGNOSIS — F339 Major depressive disorder, recurrent, unspecified: Secondary | ICD-10-CM | POA: Diagnosis not present

## 2023-06-27 NOTE — Progress Notes (Signed)
  Behavioral Health Counselor/Therapist Progress Note  Patient ID: Brandy [name], MRN: [id number]   Date: 06/27/23  Time Spent: 12:06 pm - 1:04 pm : 57 Minutes  Treatment Type: Individual Therapy.  Reported Symptoms: depression and anxiety.  Mental Status Exam: Appearance:  Casual     Behavior: Appropriate  Motor: Normal  Speech/Language:  Clear and Coherent  Affect: Congruent  Mood: dysthymic  Thought process: normal  Thought content:   WNL  Sensory/Perceptual disturbances:   WNL  Orientation: oriented to person, place, time/date, and situation  Attention: Good  Concentration: Good  Memory: WNL  Fund of knowledge:  Good  Insight:   Good  Judgment:  Good  Impulse Control: Good   Risk Assessment: Danger to Self:  No Self-injurious Behavior: No Danger to Others: No Duty to Warn:no Physical Aggression / Violence:No  Access to Firearms a concern: No  Gang Involvement:No   Subjective:   Brandy [name] participated from home, via video, is aware of limitations, and consented to treatment. Therapist participated from home office. Brandy [name] reviewed the events of the past week. She noted recently reading a post online and noted this resonating with her. She noted often giving gifts without expectation of reciprocation but noted that this can often feel like. She noted that the realization that this would mean that the person "doesn't matter". She noted a need to explore the need to have to deal with making due. She noted her daughter's vacillation in treatment towards Brandy [name]. She noted a need to investigate her own expectations. She noted her effort to put her daughter's behavior into context and her difficulty managing her frustrations and how she can direct it towards others. Brandy [name] noted that people "aren't letting it be known" as to how they feel about her. We worked on exploring this. Brandy [name] noted a need to get the therapist's opinion on this subject, as affirmation of how she  feels. We worked one exploring this and Brandy [name] expressed frustration regarding our focus on how she feels and validity of her feelings despite the lack of the therapist's confirmation or lack there of. We worked on identifying her feelings and will discuss how she feels when people do not affirm her needs in the way she needs or requires. We will work on processing this going forward. She noted her intent to write notes about the session. Brandy [name] was resistant during a portion of the session but became more flexible towards the latter portion. Therapist validated Brandy [name]'s feelings and experience, challenged negative cognitions, and provided supportive therapy.  Interventions:  CBT & interpersonal.   Diagnosis:  Episode of recurrent major depressive disorder, unspecified depression episode severity [facility])  Psychiatric Treatment: Yes , [name], MD.     Treatment Plan:  [name], self-aware, and motivated for change.   Support System: Family and friends.    Client Treatment Preferences Outpatient therapy.   Client Statement of Needs Brandy [name] would like to focus on self-care, eating healthfully, engaging in consistent exercise, focusing on sense of self,  being more organized and goal oriented, process past events including childhood, reduce negative self-talk.   Treatment Level Weekly  Symptoms  Depression: loss of interest, feeling down, poor sleep, lethargy, fluctuating appetite, feeling bad about self, and trouble concentrating.    (Status: maintained) Anxiety:  feeling anxious, difficulty managing worry, worrying about different things, trouble relaxing, restlessness, and feeling afraid as if something awful might happen.   (Status: maintained)  Goals:   Brandy [name] experiences  symptoms of depression and anxiety.    Target Date: 07/19/23 Frequency: Weekly  Progress: 15% Modality: individual    Therapist will provide referrals for additional resources as  appropriate.  Therapist will provide psycho-education regarding Brandy [name]'s diagnosis and corresponding treatment approaches and interventions. Licensed Clinical Social Worker, [place], LCSW will support the patient's ability to achieve the goals identified. will employ CBT, BA, Problem-solving, Solution Focused, Mindfulness,  coping skills, & other evidenced-based practices will be used to promote progress towards healthy functioning to help manage decrease symptoms associated with her diagnosis.   Reduce overall level, frequency, and intensity of the feelings of depression & anxiety and evidenced by decreased overall symptoms  from 6 to 7 days/week to 0 to 1 days/week per client report for at least 3 consecutive months. Verbally express understanding of the relationship between feelings of depression, anxiety and their impact on thinking patterns and behaviors. Verbalize an understanding of the role that distorted thinking plays in creating fears, excessive worry, and ruminations.    Brandy [name] participated in the creation of the treatment plan)  Brandy Ovens, LCSW

## 2023-07-04 ENCOUNTER — Ambulatory Visit: Payer: HMO | Admitting: Psychology

## 2023-07-05 ENCOUNTER — Ambulatory Visit: Admitting: Psychology

## 2023-07-05 DIAGNOSIS — F339 Major depressive disorder, recurrent, unspecified: Secondary | ICD-10-CM | POA: Diagnosis not present

## 2023-07-05 NOTE — Progress Notes (Signed)
 [facility] Counselor/Therapist Progress Note  Patient ID: Brandy [name], MRN: [id number]   Date: 07/05/23  Time Spent: 9:04 am - 10:01 am : 57 Minutes  Treatment Type: Individual Therapy.  Reported Symptoms: depression and anxiety.  Mental Status Exam: Appearance:  Casual     Behavior: Appropriate  Motor: Normal  Speech/Language:  Clear and Coherent  Affect: Congruent  Mood: dysthymic  Thought process: normal  Thought content:   WNL  Sensory/Perceptual disturbances:   WNL  Orientation: oriented to person, place, time/date, and situation  Attention: Good  Concentration: Good  Memory: WNL  Fund of knowledge:  Good  Insight:   Good  Judgment:  Good  Impulse Control: Good   Risk Assessment: Danger to Self:  No Self-injurious Behavior: No Danger to Others: No Duty to Warn:no Physical Aggression / Violence:No  Access to Firearms a concern: No  Gang Involvement:No   Subjective:   Kearstin Learn [place] participated from home, via video, is aware of limitations, and consented to treatment. Therapist participated from home office. Philamena reviewed the events of the past week. Evanny noted her birthday being today and became tearful when discussing the "sound" of being 58 years old. We worked on processing this during the session. She noted her anxiety and worry about her daughter's general day to day functioning and mood. She noted feeling helpless to support her daughter. She noted various contributing factors to her daughter's mood including finances, time away from [place], mood, and work stressors. We worked on exploring this during the session. Dedria noted her own anxiety regarding her daughter during the session. We worked on identifying how to communicate support while maintaining assertiveness, employing positive communication, and reviewing conflict resolution. We discussed the importance of identifying expectations and being mindful of areas of control and lack of control.  Stephonie was engaged and motivated during the session. She expressed commitment towards goals. Therapist praised Montia and provided supportive therapy. A Follow-up was scheduled for continued treatment.    Interventions:  CBT & interpersonal.   Diagnosis:  Episode of recurrent major depressive disorder, unspecified depression episode severity [facility])  Psychiatric Treatment: Yes , [name], MD.     Treatment Plan:  Client Abilities/Strengths Savilla is intelligent, self-aware, and motivated for change.   Support System: Family and friends.    Client Treatment Preferences Outpatient therapy.   Client Statement of Needs Tayloranne would like to focus on self-care, eating healthfully, engaging in consistent exercise, focusing on sense of self,  being more organized and goal oriented, process past events including childhood, reduce negative self-talk.   Treatment Level Weekly  Symptoms  Depression: loss of interest, feeling down, poor sleep, lethargy, fluctuating appetite, feeling bad about self, and trouble concentrating.    (Status: maintained) Anxiety:  feeling anxious, difficulty managing worry, worrying about different things, trouble relaxing, restlessness, and feeling afraid as if something awful might happen.   (Status: maintained)  Goals:   Theodore experiences symptoms of depression and anxiety.    Target Date: 07/19/23 Frequency: Weekly  Progress: 15% Modality: individual    Therapist will provide referrals for additional resources as appropriate.  Therapist will provide psycho-education regarding Timberlee's diagnosis and corresponding treatment approaches and interventions. Licensed Clinical Social Worker, [place], LCSW will support the patient's ability to achieve the goals identified. will employ CBT, BA, Problem-solving, Solution Focused, Mindfulness,  coping skills, & other evidenced-based practices will be used to promote progress towards healthy functioning to help manage  decrease symptoms associated with her  diagnosis.   Reduce overall level, frequency, and intensity of the feelings of depression & anxiety and evidenced by decreased overall symptoms  from 6 to 7 days/week to 0 to 1 days/week per client report for at least 3 consecutive months. Verbally express understanding of the relationship between feelings of depression, anxiety and their impact on thinking patterns and behaviors. Verbalize an understanding of the role that distorted thinking plays in creating fears, excessive worry, and ruminations.    [name] participated in the creation of the treatment plan)  [name], LCSW

## 2023-07-11 ENCOUNTER — Ambulatory Visit: Payer: HMO | Admitting: Psychology

## 2023-07-11 DIAGNOSIS — F339 Major depressive disorder, recurrent, unspecified: Secondary | ICD-10-CM

## 2023-07-11 NOTE — Progress Notes (Signed)
 [facility] Counselor/Therapist Progress Note  Patient ID: Brandy [name], MRN: [id number]   Date: 07/11/23  Time Spent: 12:07 pm - 1:01 pm : 54 Minutes  Treatment Type: Individual Therapy.  Reported Symptoms: depression and anxiety.  Mental Status Exam: Appearance:  Casual     Behavior: Appropriate  Motor: Normal  Speech/Language:  Clear and Coherent  Affect: Congruent  Mood: dysthymic  Thought process: normal  Thought content:   WNL  Sensory/Perceptual disturbances:   WNL  Orientation: oriented to person, place, time/date, and situation  Attention: Good  Concentration: Good  Memory: WNL  Fund of knowledge:  Good  Insight:   Good  Judgment:  Good  Impulse Control: Good   Risk Assessment: Danger to Self:  No Self-injurious Behavior: No Danger to Others: No Duty to Warn:no Physical Aggression / Violence:No  Access to Firearms a concern: No  Gang Involvement:No   Subjective:   Brandy [name] participated from home, via video, is aware of limitations, and consented to treatment. Therapist participated from home office. Brandy [name] reviewed the events of the past week. Brandy [name] noted having a busy morning. She noted she is hopeful that she can have a viable conversation with her daughter regarding finances. We worked on identifying her approach, how to manage distress, and identifying expectations. We explored this during the session. She noted wondering "What do I do if she starts testy, shutting down, or ugly?". We worked on identifying how she has dealt with this, in the past. She noted her daughter's difficulty managing her frustration and often being antagonistic when frustrated. We worked on identifying boundaries, ways to communicate boundaries, and identifying points of de-escalation. Therapist modeled this during the session. She noted feeling a sense of responsibility for her daughter's spending habits. We worked on exploring this and delineating her responsibility  verses he daughter's responsibility. We will continue to process this going forward. Brandy [name] noted having a need to process her 58th birthday and aging. We will work on processing this going forward. Brandy [name] was engaged and motivated during the session and expressed commitment towards goals. Therapist validated Brandy [name]'s feelings and experience, challenged negative cognitions, and provided supportive therapy.    Interventions:  CBT & interpersonal.   Diagnosis:  Episode of recurrent major depressive disorder, unspecified depression episode severity [facility])  Psychiatric Treatment: Yes , [name], MD.     Treatment Plan:  [name], self-aware, and motivated for change.   Support System: Family and friends.    Client Treatment Preferences Outpatient therapy.   Client Statement of Needs Brandy [name] would like to focus on self-care, eating healthfully, engaging in consistent exercise, focusing on sense of self,  being more organized and goal oriented, process past events including childhood, reduce negative self-talk.   Treatment Level Weekly  Symptoms  Depression: loss of interest, feeling down, poor sleep, lethargy, fluctuating appetite, feeling bad about self, and trouble concentrating.    (Status: maintained) Anxiety:  feeling anxious, difficulty managing worry, worrying about different things, trouble relaxing, restlessness, and feeling afraid as if something awful might happen.   (Status: maintained)  Goals:   Brandy [name] experiences symptoms of depression and anxiety.    Target Date: 07/19/23 Frequency: Weekly  Progress: 15% Modality: individual    Therapist will provide referrals for additional resources as appropriate.  Therapist will provide psycho-education regarding Brandy [name] diagnosis and corresponding treatment approaches and interventions. Licensed Clinical Social Worker, [place], LCSW will support the patient's ability to achieve the goals  identified.  will employ CBT, BA, Problem-solving, Solution Focused, Mindfulness,  coping skills, & other evidenced-based practices will be used to promote progress towards healthy functioning to help manage decrease symptoms associated with her diagnosis.   Reduce overall level, frequency, and intensity of the feelings of depression & anxiety and evidenced by decreased overall symptoms  from 6 to 7 days/week to 0 to 1 days/week per client report for at least 3 consecutive months. Verbally express understanding of the relationship between feelings of depression, anxiety and their impact on thinking patterns and behaviors. Verbalize an understanding of the role that distorted thinking plays in creating fears, excessive worry, and ruminations.    Brandy [name] participated in the creation of the treatment plan)  [name], LCSW

## 2023-07-12 DIAGNOSIS — F411 Generalized anxiety disorder: Secondary | ICD-10-CM | POA: Diagnosis not present

## 2023-07-12 DIAGNOSIS — F5105 Insomnia due to other mental disorder: Secondary | ICD-10-CM | POA: Diagnosis not present

## 2023-07-12 DIAGNOSIS — R41840 Attention and concentration deficit: Secondary | ICD-10-CM | POA: Diagnosis not present

## 2023-07-12 DIAGNOSIS — F3181 Bipolar II disorder: Secondary | ICD-10-CM | POA: Diagnosis not present

## 2023-07-18 ENCOUNTER — Ambulatory Visit: Payer: HMO | Admitting: Psychology

## 2023-07-18 DIAGNOSIS — F339 Major depressive disorder, recurrent, unspecified: Secondary | ICD-10-CM | POA: Diagnosis not present

## 2023-07-18 NOTE — Progress Notes (Signed)
 [facility] Counselor/Therapist Progress Note  Patient ID: Brandy [name], MRN: [id number]   Date: 07/18/23  Time Spent: 12:08 pm - 1:00 pm : 52 Minutes  Treatment Type: Individual Therapy.  Reported Symptoms: depression and anxiety.  Mental Status Exam: Appearance:  Casual     Behavior: Appropriate  Motor: Normal  Speech/Language:  Clear and Coherent  Affect: Congruent  Mood: dysthymic  Thought process: normal  Thought content:   WNL  Sensory/Perceptual disturbances:   WNL  Orientation: oriented to person, place, time/date, and situation  Attention: Good  Concentration: Good  Memory: WNL  Fund of knowledge:  Good  Insight:   Good  Judgment:  Good  Impulse Control: Good   Risk Assessment: Danger to Self:  No Self-injurious Behavior: No Danger to Others: No Duty to Warn:no Physical Aggression / Violence:No  Access to Firearms a concern: No  Gang Involvement:No   Subjective:   Brandy [name] participated from home, via video, is aware of limitations, and consented to treatment. Therapist participated from home office. Brandy [name] reviewed the events of the past week. She noted waking feeling groggy due to her OTC medication. She noted her cousin recent exhibiting possible signs of depression and noted feeling lonely. She noted the idea of loneliness resonating with her Brandy [name]). She noted that this often results in "holding onto" people more closely. She noted the events of her recent conversation with her daughter. She noted that her daughter declined to discuss the conversation. She noted her frustration regarding her granddaughter's behavior during a recent family get together and working on providing feedback in a digestible manner. She noted her body being the primary barrier. She noted the possibility of having to move and noted feeling stressed regarding this issue. She noted her effort to focus on areas of control and managing the stressors related to this  possibility. Therapist praised Brandy [name] for her effort in this area. Brandy [name] noted often takes on more than she can handle and noted often losing "steam". We discussed possible contributing factors to this including over thinking "which robs me from being present".  Therapist encouraged additional processing in this area, going forward, to be discussed during the follow-up. Therapist validated Brandy [name]'s feelings and experience, praised her for her mindfulness, and provided supportive therapy.   Interventions:  CBT & interpersonal.   Diagnosis:  Episode of recurrent major depressive disorder, unspecified depression episode severity [facility])  Psychiatric Treatment: Yes , [name], MD.     Treatment Plan:  [name], self-aware, and motivated for change.   Support System: Family and friends.    Client Treatment Preferences Outpatient therapy.   Client Statement of Needs Brandy [name] would like to focus on self-care, eating healthfully, engaging in consistent exercise, focusing on sense of self,  being more organized and goal oriented, process past events including childhood, reduce negative self-talk.   Treatment Level Weekly  Symptoms  Depression: loss of interest, feeling down, poor sleep, lethargy, fluctuating appetite, feeling bad about self, and trouble concentrating.    (Status: maintained) Anxiety:  feeling anxious, difficulty managing worry, worrying about different things, trouble relaxing, restlessness, and feeling afraid as if something awful might happen.   (Status: maintained)  Goals:   Brandy [name] experiences symptoms of depression and anxiety.    Target Date: 07/19/23 Frequency: Weekly  Progress: 15% Modality: individual    Therapist will provide referrals for additional resources as appropriate.  Therapist will provide psycho-education regarding Brandy [name]'s diagnosis and corresponding treatment approaches and interventions.  Licensed Clinical Social  Worker, [place], LCSW will support the patient's ability to achieve the goals identified. will employ CBT, BA, Problem-solving, Solution Focused, Mindfulness,  coping skills, & other evidenced-based practices will be used to promote progress towards healthy functioning to help manage decrease symptoms associated with her diagnosis.   Reduce overall level, frequency, and intensity of the feelings of depression & anxiety and evidenced by decreased overall symptoms  from 6 to 7 days/week to 0 to 1 days/week per client report for at least 3 consecutive months. Verbally express understanding of the relationship between feelings of depression, anxiety and their impact on thinking patterns and behaviors. Verbalize an understanding of the role that distorted thinking plays in creating fears, excessive worry, and ruminations.    Brandy [name] participated in the creation of the treatment plan)  [name], LCSW

## 2023-07-19 ENCOUNTER — Ambulatory Visit (INDEPENDENT_AMBULATORY_CARE_PROVIDER_SITE_OTHER): Payer: HMO | Admitting: Primary Care

## 2023-07-19 ENCOUNTER — Encounter: Payer: Self-pay | Admitting: Primary Care

## 2023-07-19 ENCOUNTER — Other Ambulatory Visit (HOSPITAL_COMMUNITY)
Admission: RE | Admit: 2023-07-19 | Discharge: 2023-07-19 | Disposition: A | Discharge status: Home / Self Care | Source: Ambulatory Visit | Attending: Primary Care | Admitting: Primary Care

## 2023-07-19 VITALS — BP 104/68 | HR 67 | Temp 98.2°F | Ht 67.5 in | Wt 260.0 lb

## 2023-07-19 DIAGNOSIS — Z124 Encounter for screening for malignant neoplasm of cervix: Secondary | ICD-10-CM | POA: Insufficient documentation

## 2023-07-19 DIAGNOSIS — I10 Essential (primary) hypertension: Secondary | ICD-10-CM

## 2023-07-19 DIAGNOSIS — M2550 Pain in unspecified joint: Secondary | ICD-10-CM | POA: Diagnosis not present

## 2023-07-19 DIAGNOSIS — R110 Nausea: Secondary | ICD-10-CM

## 2023-07-19 DIAGNOSIS — E559 Vitamin D deficiency, unspecified: Secondary | ICD-10-CM | POA: Diagnosis not present

## 2023-07-19 DIAGNOSIS — Z01419 Encounter for gynecological examination (general) (routine) without abnormal findings: Secondary | ICD-10-CM | POA: Diagnosis not present

## 2023-07-19 DIAGNOSIS — M5442 Lumbago with sciatica, left side: Secondary | ICD-10-CM

## 2023-07-19 DIAGNOSIS — E538 Deficiency of other specified B group vitamins: Secondary | ICD-10-CM

## 2023-07-19 DIAGNOSIS — Z0000 Encounter for general adult medical examination without abnormal findings: Secondary | ICD-10-CM | POA: Diagnosis not present

## 2023-07-19 DIAGNOSIS — E1165 Type 2 diabetes mellitus with hyperglycemia: Secondary | ICD-10-CM | POA: Diagnosis not present

## 2023-07-19 DIAGNOSIS — Z1151 Encounter for screening for human papillomavirus (HPV): Secondary | ICD-10-CM | POA: Diagnosis not present

## 2023-07-19 DIAGNOSIS — L719 Rosacea, unspecified: Secondary | ICD-10-CM

## 2023-07-19 DIAGNOSIS — F330 Major depressive disorder, recurrent, mild: Secondary | ICD-10-CM | POA: Diagnosis not present

## 2023-07-19 DIAGNOSIS — Z1211 Encounter for screening for malignant neoplasm of colon: Secondary | ICD-10-CM

## 2023-07-19 DIAGNOSIS — K219 Gastro-esophageal reflux disease without esophagitis: Secondary | ICD-10-CM | POA: Diagnosis not present

## 2023-07-19 DIAGNOSIS — G4733 Obstructive sleep apnea (adult) (pediatric): Secondary | ICD-10-CM

## 2023-07-19 DIAGNOSIS — E781 Pure hyperglyceridemia: Secondary | ICD-10-CM | POA: Diagnosis not present

## 2023-07-19 DIAGNOSIS — G8929 Other chronic pain: Secondary | ICD-10-CM

## 2023-07-19 DIAGNOSIS — F411 Generalized anxiety disorder: Secondary | ICD-10-CM

## 2023-07-19 NOTE — Assessment & Plan Note (Signed)
 Repeat A1C pending.   Remain off treatment for now. She will work on improving her diet, get back on track.  Urine microalbumin due and pending. Foot exam today.  Declines updated pneumonia vaccine today.

## 2023-07-19 NOTE — Assessment & Plan Note (Signed)
 Continued.  Continue titration of gabapentin to 300-900 mg TID. Continue Cymbalta 90 mg daily

## 2023-07-19 NOTE — Assessment & Plan Note (Signed)
 Immunizations UTD. Declines Prevnar 20  Pap smear due, completed today Mammogram scheduled Colon cancer screening due, she opts for Cologuard. Orders placed.  Discussed the importance of a healthy diet and regular exercise in order for weight loss, and to reduce the risk of further co-morbidity.  Exam stable. Labs pending.  Follow up in 1 year for repeat physical.

## 2023-07-19 NOTE — Progress Notes (Signed)
 Subjective:    Patient ID: Brandy [name], female    DOB: May 06, 1965, 58 y.o.   MRN: [id number]  HPI  Brandy [name] is a very pleasant 58 y.o. female who presents today for complete physical and follow up of chronic conditions.  Immunizations: -Tetanus: Completed in 2011 -Shingles: Never completed  -Pneumonia: Completed pneumovax in 2020  Diet: Fair diet.  Exercise: No regular exercise.  Eye exam: Completed years ago. Plans on scheduling  Dental exam: Completed 1 year ago.   Pap Smear: Completed in March 2021, declined last year  Mammogram: Completed in 2024, scheduled for March 2025.  Colonoscopy: Completed Cologuard in 2022  BP Readings from Last 3 Encounters:  07/19/23 104/68  06/12/23 100/60  12/04/22 124/62   Wt Readings from Last 3 Encounters:  07/19/23 260 lb (117.9 kg)  06/12/23 258 lb 8 oz (117.3 kg)  06/07/23 246 lb (111.6 kg)       Review of Systems  Constitutional:  Negative for unexpected weight change.  HENT:  Negative for rhinorrhea.   Respiratory:  Negative for cough and shortness of breath.   Cardiovascular:  Negative for chest pain.  Gastrointestinal:  Negative for constipation and diarrhea.  Genitourinary:  Negative for difficulty urinating.  Musculoskeletal:  Positive for arthralgias, back pain and myalgias.  Skin:  Negative for rash.  Allergic/Immunologic: Negative for environmental allergies.  Neurological:  Negative for dizziness and headaches.  Psychiatric/Behavioral:  The patient is not nervous/anxious.          Past Medical History:  Diagnosis Date   Anxiety    Arthritis    Back pain    Brain fog    CHF (congestive heart failure) (HCC)    pt not aware of this   Depression    Diabetes mellitus without complication (HCC)    Difficulty walking    Dysmetabolic syndrome X    Fatigue    Fibromyalgia    neuropathy   Hypertension    Hypertriglyceridemia    Joint pain    Leg weakness    Lower extremity edema    Muscle  atrophy    Neuropathy    Obesity    Osteoarthritis    PONV (postoperative nausea and vomiting)    Poor memory    Post-menopausal bleeding    Prediabetes    Rosacea    Sleep apnea    not on cpap yet   Tetanus vaccine causing adverse effect in therapeutic use    Vitamin D deficiency     Social History   Socioeconomic History   Marital status: Married    Spouse name: Ed   Number of children: 3   Years of education: Not on file   Highest education level: Associate degree: academic program  Occupational History   Occupation: Immunologist: [employer]  Tobacco Use   Smoking status: Never   Smokeless tobacco: Never  Vaping Use   Vaping status: Never Used  Substance and Sexual Activity   Alcohol use: Not Currently    Comment: rarely - once a year   Drug use: No   Sexual activity: Not Currently    Birth control/protection: Post-menopausal  Other Topics Concern   Not on file  Social History Narrative   Best boy      Married; 3 kids (middle child-severely autistic)      No regular exercise   Social Drivers of Health   Financial Resource Strain: High Risk (06/12/2023)  Overall Financial Resource Strain (CARDIA)    Difficulty of Paying Living Expenses: Hard  Food Insecurity: No Food Insecurity (06/12/2023)   Hunger Vital Sign    Worried About Running Out of Food in the Last Year: Never true    Ran Out of Food in the Last Year: Never true  Recent Concern: Food Insecurity - Food Insecurity Present (06/07/2023)   Hunger Vital Sign    Worried About Running Out of Food in the Last Year: Sometimes true    Ran Out of Food in the Last Year: Sometimes true  Transportation Needs: No Transportation Needs (06/12/2023)   PRAPARE - Administrator, Civil Service (Medical): No    Lack of Transportation (Non-Medical): No  Physical Activity: Insufficiently Active (06/12/2023)   Exercise Vital Sign    Days of Exercise per Week:  2 days    Minutes of Exercise per Session: 30 min  Stress: Stress Concern Present (06/12/2023)   [employer] of Occupational Health - Occupational Stress Questionnaire    Feeling of Stress : Very much  Social Connections: Socially Integrated (06/12/2023)   Social Connection and Isolation Panel [NHANES]    Frequency of Communication with Friends and Family: More than three times a week    Frequency of Social Gatherings with Friends and Family: Never    Attends Religious Services: More than 4 times per year    Active Member of [employer] or Organizations: No    Attends Engineer, structural: More than 4 times per year    Marital Status: Married  Catering manager Violence: Not At Risk (06/07/2023)   Humiliation, Afraid, Rape, and Kick questionnaire    Fear of Current or Ex-Partner: No    Emotionally Abused: No    Physically Abused: No    Sexually Abused: No    Past Surgical History:  Procedure Laterality Date   BONE TUMOR EXCISION  1975   Right leg   DILATATION & CURETTAGE/HYSTEROSCOPY WITH MYOSURE N/A 10/07/2019   Procedure: DILATATION AND CURETTAGE /HYSTEROSCOPY, Myosure, Polypectomy;  Surgeon: Darby Bing, MD;  Location: [facility] OR;  Service: Gynecology;  Laterality: N/A;   ENDOMETRIAL BIOPSY  07/14/2019       epidural steroid injection     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   Procedure: LAPAROSCOPIC ROUX-EN-Y GASTRIC BYPASS WITH UPPER ENDOSCOPY;  Surgeon: Sheliah Hatch, De Blanch, MD;  Location: [facility];  Service: General;  Laterality: N/A;    Family History  Problem Relation Age of Onset   Rheum arthritis Mother    COPD Mother    Stroke Mother    Depression Mother    Anxiety disorder Mother    Eating disorder Mother    Obesity Mother    Hypertension Father    Depression Father    Alcoholism Father    Heart disease Maternal Grandmother    Stroke Maternal Grandmother    Alcohol abuse Maternal Grandmother    Alcohol abuse Maternal Grandfather    Aneurysm Paternal  Grandmother    Autism Son        severe   Diabetes Paternal Aunt    Breast cancer Neg Hx     Allergies  Allergen Reactions   Diphenhydramine Hcl Rash    minor rash   Penicillins Rash    Current Outpatient Medications on File Prior to Visit  Medication Sig Dispense Refill   acetaminophen (TYLENOL) 650 MG CR tablet Take 1,300 mg by mouth every 8 (eight) hours as needed for pain.     atorvastatin (LIPITOR)  10 MG tablet TAKE 1 TABLET BY MOUTH EVERY DAY FOR CHOLESTEROL 90 tablet 2   Blood Glucose Monitoring Suppl (ONETOUCH VERIO REFLECT) w/Device KIT 3 (three) times daily.     Blood Glucose Monitoring Suppl DEVI 1 each by Does not apply route in the morning, at noon, and at bedtime. May substitute to any manufacturer covered by patient's insurance. 1 each 0   cetirizine (ZYRTEC) 10 MG tablet TAKE 1 TABLET (10 MG TOTAL) BY MOUTH DAILY. FOR ALLERGIES 90 tablet 1   citalopram (CELEXA) 40 MG tablet Take 40 mg by mouth daily.     DULoxetine (CYMBALTA) 30 MG capsule Take 30 mg by mouth daily. Take with 60 mg to equal 90 mg daily     DULoxetine (CYMBALTA) 60 MG capsule Take 60 mg by mouth daily. Take with 30 mg to equal 90 mg daily     folic acid (FOLVITE) 1 MG tablet Take 1 mg by mouth daily.     gabapentin (NEURONTIN) 300 MG capsule Take 3 capsules (900 mg total) by mouth 3 (three) times daily. For back pain 270 capsule 3   glucose blood (ONETOUCH VERIO) test strip TEST BLOOD SUGAR MORNING, NOON AND AT BEDTIME 100 strip 3   lamoTRIgine (LAMICTAL) 100 MG tablet Take 100 mg by mouth every evening.     lamoTRIgine (LAMICTAL) 200 MG tablet Take 200 mg by mouth in the morning.     metroNIDAZOLE (METROCREAM) 0.75 % cream Apply 1 application topically daily as needed (rosacea).     Multiple Minerals-Vitamins (CALCIUM-MAGNESIUM-ZINC-D3) TABS Take 2 tablets by mouth daily.     ondansetron (ZOFRAN-ODT) 4 MG disintegrating tablet Dissolve 1 tablet (4 mg total) by mouth every 6 (six) hours as needed for  nausea or vomiting. 20 tablet 0   pantoprazole (PROTONIX) 40 MG tablet Take 1 tablet (40 mg total) by mouth daily. 90 tablet 0   SODIUM FLUORIDE 5000 SENSITIVE 1.1-5 % GEL See admin instructions. see package     Vitamin D, Ergocalciferol, (DRISDOL) 1.25 MG (50000 UNIT) CAPS capsule TAKE 1 CAPSULE BY MOUTH ONCE WEEKLY FOR VITAMIN D. 12 capsule 1   No current facility-administered medications on file prior to visit.    BP 104/68   Pulse 67   Temp 98.2 F (36.8 C) (Temporal)   Ht 5' 7.5" (1.715 m)   Wt 260 lb (117.9 kg)   LMP  (LMP Unknown)   SpO2 98%   BMI 40.12 kg/m  Objective:   Physical Exam Exam conducted with a chaperone present.  HENT:     Right Ear: Tympanic membrane and ear canal normal.     Left Ear: Tympanic membrane and ear canal normal.  Eyes:     Pupils: Pupils are equal, round, and reactive to light.  Cardiovascular:     Rate and Rhythm: Normal rate and regular rhythm.  Pulmonary:     Effort: Pulmonary effort is normal.     Breath sounds: Normal breath sounds.  Abdominal:     General: Bowel sounds are normal.     Palpations: Abdomen is soft.     Tenderness: There is no abdominal tenderness.  Genitourinary:    Labia:        Right: No rash, tenderness or lesion.        Left: No rash, tenderness or lesion.      Vagina: Normal.     Cervix: Normal.     Uterus: Normal.      Adnexa: Right adnexa normal and left adnexa  normal.  Musculoskeletal:        General: Normal range of motion.     Cervical back: Neck supple.  Skin:    General: Skin is warm and dry.  Neurological:     Mental Status: She is alert and oriented to person, place, and time.     Cranial Nerves: No cranial nerve deficit.     Deep Tendon Reflexes:     Reflex Scores:      Patellar reflexes are 2+ on the right side and 2+ on the left side. Psychiatric:        Mood and Affect: Mood normal.           Assessment & Plan:  Preventative health care Assessment & Plan: Immunizations UTD.  Declines Prevnar 20  Pap smear due, completed today Mammogram scheduled Colon cancer screening due, she opts for Cologuard. Orders placed.  Discussed the importance of a healthy diet and regular exercise in order for weight loss, and to reduce the risk of further co-morbidity.  Exam stable. Labs pending.  Follow up in 1 year for repeat physical.    Essential hypertension, benign Assessment & Plan: Controlled.  Continue off medications.   Orders: -     Comprehensive metabolic panel  Severe obstructive sleep apnea-hypopnea syndrome Assessment & Plan: Not using CPAP machine due to her severely autistic son. She will work on diet and get back on track.    Gastroesophageal reflux disease, unspecified whether esophagitis present Assessment & Plan: Controlled.  Continue pantoprazole 40 mg daily PRN for which she uses sparingly.    Type 2 diabetes mellitus with hyperglycemia, without long-term current use of insulin (HCC) Assessment & Plan: Repeat A1C pending.   Remain off treatment for now. She will work on improving her diet, get back on track.  Urine microalbumin due and pending. Foot exam today.  Declines updated pneumonia vaccine today.   Orders: -     Microalbumin / creatinine urine ratio -     Hemoglobin A1c  Chronic bilateral low back pain with left-sided sciatica Assessment & Plan: Chronic and continued.  Following with sports medicine. Continue titration of gabapentin to 300-900 mg TID. Continue Cymbalta 90 mg daily.   Arthralgia of multiple sites Assessment & Plan: Continued.  Continue titration of gabapentin to 300-900 mg TID. Continue Cymbalta 90 mg daily    GAD (generalized anxiety disorder) Assessment & Plan: Stable. Following with psychiatry .  Continue Cymbalta 90 mg daily, Lamictal 200 mg in AM and 100 mg in PM, citalopram 40 mg daily,.   HYPERTRIGLYCERIDEMIA Assessment & Plan: Repeat lipid panel pending.  Continue  atorvastatin 10 mg daily  Orders: -     Lipid panel  Mild episode of recurrent major depressive disorder (HCC) Assessment & Plan: Stable. Following with psychiatry .  Continue Cymbalta 90 mg daily, Lamictal 200 mg in AM and 100 mg in PM, citalopram 40 mg daily,.   Nausea without vomiting Assessment & Plan: Resolved.  Continue to monitor    Rosacea Assessment & Plan: Stable.  Continue metronidazole 0.75% PRN.   Vitamin B12 deficiency Assessment & Plan: Repeat B12 level pending.  Orders: -     Vitamin B12  Vitamin D deficiency Assessment & Plan: Repeat vitamin D pending.  Orders: -     VITAMIN D 25 Hydroxy (Vit-D Deficiency, Fractures)  Screening for cervical cancer -     Cytology - PAP  Screening for colon cancer -     Cologuard  Doreene Nest, NP

## 2023-07-19 NOTE — Assessment & Plan Note (Addendum)
 Controlled.  Continue pantoprazole 40 mg daily PRN for which she uses sparingly.

## 2023-07-19 NOTE — Assessment & Plan Note (Signed)
 Not using CPAP machine due to her severely autistic son. She will work on diet and get back on track.

## 2023-07-19 NOTE — Assessment & Plan Note (Signed)
 Repeat lipid panel pending.  Continue atorvastatin 10 mg daily.

## 2023-07-19 NOTE — Assessment & Plan Note (Signed)
 Stable. Following with psychiatry .  Continue Cymbalta 90 mg daily, Lamictal 200 mg in AM and 100 mg in PM, citalopram 40 mg daily,.

## 2023-07-19 NOTE — Assessment & Plan Note (Signed)
 Chronic and continued.  Following with sports medicine. Continue titration of gabapentin to 300-900 mg TID. Continue Cymbalta 90 mg daily.

## 2023-07-19 NOTE — Assessment & Plan Note (Signed)
Controlled. ? ?Continue off medications.  ?

## 2023-07-19 NOTE — Assessment & Plan Note (Signed)
 Resolved.  Continue to monitor.

## 2023-07-19 NOTE — Assessment & Plan Note (Signed)
 Stable.  Continue metronidazole 0.75% PRN.

## 2023-07-19 NOTE — Assessment & Plan Note (Signed)
 Repeat vitamin D pending.

## 2023-07-19 NOTE — Patient Instructions (Signed)
Stop by the lab prior to leaving today. I will notify you of your results once received.   Complete the Cologuard kit once received.  Please schedule a follow up visit for 6 months for a diabetes check.  It was a pleasure to see you today!

## 2023-07-19 NOTE — Assessment & Plan Note (Signed)
 Repeat B 12 level pending.

## 2023-07-20 LAB — LIPID PANEL
Cholesterol: 196 mg/dL (ref <200)
HDL: 61 mg/dL (ref >=50)
LDL Cholesterol (Calc): 109 mg/dL — ABNORMAL HIGH
Non-HDL Cholesterol (Calc): 135 mg/dL — ABNORMAL HIGH (ref <130)
Total CHOL/HDL Ratio: 3.2 (calc) (ref <5.0)
Triglycerides: 144 mg/dL (ref <150)

## 2023-07-20 LAB — MICROALBUMIN / CREATININE URINE RATIO
Creatinine, Urine: 91 mg/dL (ref 20–275)
Microalb, Ur: <0.2 mg/dL

## 2023-07-20 LAB — COMPREHENSIVE METABOLIC PANEL
AG Ratio: 1.7 (calc) (ref 1.0–2.5)
ALT: 13 U/L (ref 6–29)
AST: 18 U/L (ref 10–35)
Albumin: 3.7 g/dL (ref 3.6–5.1)
Alkaline phosphatase (APISO): 61 U/L (ref 37–153)
BUN: 19 mg/dL (ref 7–25)
CO2: 25 mmol/L (ref 20–32)
Calcium: 8.7 mg/dL (ref 8.6–10.4)
Chloride: 106 mmol/L (ref 98–110)
Creat: 0.76 mg/dL (ref 0.50–1.05)
Globulin: 2.2 g/dL (ref 1.9–3.7)
Glucose, Bld: 113 mg/dL — ABNORMAL HIGH (ref 65–99)
Potassium: 4.7 mmol/L (ref 3.5–5.3)
Sodium: 139 mmol/L (ref 135–146)
Total Bilirubin: 0.3 mg/dL (ref 0.2–1.2)
Total Protein: 5.9 g/dL — ABNORMAL LOW (ref 6.1–8.1)
eGFR: 90 mL/min/{1.73_m2} (ref >=60)

## 2023-07-20 LAB — VITAMIN D 25 HYDROXY (VIT D DEFICIENCY, FRACTURES): Vit D, 25-Hydroxy: 25 ng/mL — ABNORMAL LOW (ref 30–100)

## 2023-07-20 LAB — HEMOGLOBIN A1C
Hgb A1c MFr Bld: 6 %{Hb} — ABNORMAL HIGH (ref <5.7)
Mean Plasma Glucose: 126 mg/dL
eAG (mmol/L): 7 mmol/L

## 2023-07-20 LAB — VITAMIN B12: Vitamin B-12: 415 pg/mL (ref 200–1100)

## 2023-07-21 DIAGNOSIS — E559 Vitamin D deficiency, unspecified: Secondary | ICD-10-CM

## 2023-07-21 DIAGNOSIS — E1165 Type 2 diabetes mellitus with hyperglycemia: Secondary | ICD-10-CM

## 2023-07-21 DIAGNOSIS — E781 Pure hyperglyceridemia: Secondary | ICD-10-CM

## 2023-07-23 LAB — CYTOLOGY - PAP
Comment: NEGATIVE
Diagnosis: NEGATIVE
High risk HPV: NEGATIVE

## 2023-07-25 ENCOUNTER — Ambulatory Visit: Payer: HMO | Admitting: Psychology

## 2023-07-25 DIAGNOSIS — F339 Major depressive disorder, recurrent, unspecified: Secondary | ICD-10-CM | POA: Diagnosis not present

## 2023-07-25 NOTE — Progress Notes (Signed)
 [facility] Counselor/Therapist Progress Note  Patient ID: Brandy [name], MRN: [id number]   Date: 07/25/23  Time Spent: 12:07 pm - 1:05 pm : 58 Minutes  Treatment Type: Individual Therapy.  Reported Symptoms: depression and anxiety.  Mental Status Exam: Appearance:  Casual     Behavior: Appropriate  Motor: Normal  Speech/Language:  Clear and Coherent  Affect: Tearful  Mood: dysthymic  Thought process: normal  Thought content:   WNL  Sensory/Perceptual disturbances:   WNL  Orientation: oriented to person, place, time/date, and situation  Attention: Good  Concentration: Good  Memory: WNL  Fund of knowledge:  Good  Insight:   Good  Judgment:  Good  Impulse Control: Good   Risk Assessment: Danger to Self:  No Self-injurious Behavior: No Danger to Others: No Duty to Warn:no Physical Aggression / Violence:No  Access to Firearms a concern: No  Gang Involvement:No   Subjective:   Kaetlyn Noa Stage participated from home, via video, is aware of limitations, and consented to treatment. Therapist participated from home office. Gayleen reviewed the events of the past week. She noted experiencing financial stressors and noted that this being a consistent stressor. She noted the recent costly car repair resulted in feeling this physically. She noted struggles with this due to their limited budget and difficulty recouping this money. We worked on exploring this during the session. She noted that her husband,  is 72 years old, is unable to work. She noted the possibility of housing stressors and noted the worry that the home that she rents will be sold. She noted her daughter and grand-daughter's financial reliance on Carilyn and he husband. We worked on exploring this during the session, processing her feelings, and identifying areas of control and lack of control. She noted "I do all that I can do and it's not a enough". She noted previously owning a home and not being able to  repair her home. Chasitty noted "not giving her daughter what she needs" when she had her daughter move out of the family home to live in a home, with heating, when their heat was irreparable.We worked on beginning to process this and will continue during our follow-up along with feeling less than in the "eyes" of her friend,Anna. . We discussed the lack of financial sustainability in relation to financially helping others. Jillana was engaged and motivated during the session. She expressed commitment towards our goals. Therapist validated Karmina's feelings and experience and provided supportive therapy.   Interventions:  CBT & interpersonal.   Diagnosis:  Episode of recurrent major depressive disorder, unspecified depression episode severity [facility])  Psychiatric Treatment: Yes , [name], MD.     Treatment Plan:  Client Abilities/Strengths Loral is intelligent, self-aware, and motivated for change.   Support System: Family and friends.    Client Treatment Preferences Outpatient therapy.   Client Statement of Needs Charlesetta would like to focus on self-care, eating healthfully, engaging in consistent exercise, focusing on sense of self,  being more organized and goal oriented, process past events including childhood, reduce negative self-talk.   Treatment Level Weekly  Symptoms  Depression: loss of interest, feeling down, poor sleep, lethargy, fluctuating appetite, feeling bad about self, and trouble concentrating.    (Status: maintained) Anxiety:  feeling anxious, difficulty managing worry, worrying about different things, trouble relaxing, restlessness, and feeling afraid as if something awful might happen.   (Status: maintained)  Goals:   Jessel experiences symptoms of depression and anxiety.    Target Date:  08/19/23 Frequency: Weekly  Progress: 15% Modality: individual    Therapist will provide referrals for additional resources as appropriate.  Therapist will provide psycho-education  regarding Remona's diagnosis and corresponding treatment approaches and interventions. Licensed Clinical Social Worker, [place], LCSW will support the patient's ability to achieve the goals identified. will employ CBT, BA, Problem-solving, Solution Focused, Mindfulness,  coping skills, & other evidenced-based practices will be used to promote progress towards healthy functioning to help manage decrease symptoms associated with her diagnosis.   Reduce overall level, frequency, and intensity of the feelings of depression & anxiety and evidenced by decreased overall symptoms  from 6 to 7 days/week to 0 to 1 days/week per client report for at least 3 consecutive months. Verbally express understanding of the relationship between feelings of depression, anxiety and their impact on thinking patterns and behaviors. Verbalize an understanding of the role that distorted thinking plays in creating fears, excessive worry, and ruminations.    [name] participated in the creation of the treatment plan)  [name], LCSW

## 2023-08-01 ENCOUNTER — Ambulatory Visit: Payer: HMO | Admitting: Psychology

## 2023-08-01 DIAGNOSIS — F339 Major depressive disorder, recurrent, unspecified: Secondary | ICD-10-CM | POA: Diagnosis not present

## 2023-08-01 DIAGNOSIS — F3289 Other specified depressive episodes: Secondary | ICD-10-CM

## 2023-08-01 NOTE — Progress Notes (Signed)
 [name] Counselor/Therapist Progress Note  Patient ID: Brandy [name], MRN: [id number]   Date: 08/01/23  Time Spent: 12:08 pm - 1:03  pm : 55 Minutes  Treatment Type: Individual Therapy.  Reported Symptoms: depression and anxiety.  Mental Status Exam: Appearance:  Casual     Behavior: Appropriate  Motor: Normal  Speech/Language:  Clear and Coherent  Affect: Tearful  Mood: dysthymic  Thought process: normal  Thought content:   WNL  Sensory/Perceptual disturbances:   WNL  Orientation: oriented to person, place, time/date, and situation  Attention: Good  Concentration: Good  Memory: WNL  Fund of knowledge:  Good  Insight:   Good  Judgment:  Good  Impulse Control: Good   Risk Assessment: Danger to Self:  No Self-injurious Behavior: No Danger to Others: No Duty to Warn:no Physical Aggression / Violence:No  Access to Firearms a concern: No  Gang Involvement:No   Subjective:   Brandy [name] participated from home, via video, is aware of limitations, and consented to treatment. Therapist participated from home office. Brandy [name] reviewed the events of the past week.She noted attempting to get tasks completed at work and noted difficulty managing this due to significant back pain and the amount of tasks that need to be completed. We worked on exploring this during the session. She noted interpersonal stressors with her close friend, Brandy [name], and discussed how this affects her and the relationship. She noted various strains in this relationship which has lasted 40+ years. She noted a recent discussion with her friend that result in "hurt feelings". We worked on exploring this during the session and effect of this on her. Brandy [name] noted her attempts to deescalate this stressor. Therapist praised Brandy [name] for this during the session.  Brandy [name] noted continued financial stressors. We worked on processing this during the session. Brandy [name] was engaged and motivated during the session. she  expressed commitment towards our goals. Therapist praised Brandy [name] and provided supportive therapy.   Interventions:  CBT & interpersonal.   Diagnosis:  Episode of recurrent major depressive disorder, unspecified depression episode severity (HCC)  Other depression  Psychiatric Treatment: Yes , [name], MD.     Treatment Plan:  [name], self-aware, and motivated for change.   Support System: Family and friends.    Client Treatment Preferences Outpatient therapy.   Client Statement of Needs Brandy [name] would like to focus on self-care, eating healthfully, engaging in consistent exercise, focusing on sense of self,  being more organized and goal oriented, process past events including childhood, reduce negative self-talk.   Treatment Level Weekly  Symptoms  Depression: loss of interest, feeling down, poor sleep, lethargy, fluctuating appetite, feeling bad about self, and trouble concentrating.    (Status: maintained) Anxiety:  feeling anxious, difficulty managing worry, worrying about different things, trouble relaxing, restlessness, and feeling afraid as if something awful might happen.   (Status: maintained)  Goals:   Brandy [name] experiences symptoms of depression and anxiety.    Target Date: 08/19/23 Frequency: Weekly  Progress: 15% Modality: individual    Therapist will provide referrals for additional resources as appropriate.  Therapist will provide psycho-education regarding Brandy [name]'s diagnosis and corresponding treatment approaches and interventions. Licensed Clinical Social Worker, [place], LCSW will support the patient's ability to achieve the goals identified. will employ CBT, BA, Problem-solving, Solution Focused, Mindfulness,  coping skills, & other evidenced-based practices will be used to promote progress towards healthy functioning to help manage decrease symptoms associated with her diagnosis.   Reduce overall  level, frequency,  and intensity of the feelings of depression & anxiety and evidenced by decreased overall symptoms  from 6 to 7 days/week to 0 to 1 days/week per client report for at least 3 consecutive months. Verbally express understanding of the relationship between feelings of depression, anxiety and their impact on thinking patterns and behaviors. Verbalize an understanding of the role that distorted thinking plays in creating fears, excessive worry, and ruminations.    Brandy [name] participated in the creation of the treatment plan)  [name], LCSW

## 2023-08-02 ENCOUNTER — Ambulatory Visit: Payer: HMO

## 2023-08-08 ENCOUNTER — Ambulatory Visit: Payer: HMO | Admitting: Psychology

## 2023-08-08 DIAGNOSIS — F339 Major depressive disorder, recurrent, unspecified: Secondary | ICD-10-CM | POA: Diagnosis not present

## 2023-08-08 NOTE — Progress Notes (Signed)
 [facility] Counselor/Therapist Progress Note  Patient ID: PERLITA FORBUSH, MRN: [id number]   Date: 08/08/23  Time Spent: 12:07 pm - 12:57 pm : 50 Minutes  Treatment Type: Individual Therapy.  Reported Symptoms: depression and anxiety.  Mental Status Exam: Appearance:  Casual     Behavior: Appropriate  Motor: Normal  Speech/Language:  Clear and Coherent  Affect: Congruent  Mood: normal  Thought process: normal  Thought content:   WNL  Sensory/Perceptual disturbances:   WNL  Orientation: oriented to person, place, time/date, and situation  Attention: Good  Concentration: Good  Memory: WNL  Fund of knowledge:  Good  Insight:   Good  Judgment:  Good  Impulse Control: Good   Risk Assessment: Danger to Self:  No Self-injurious Behavior: No Danger to Others: No Duty to Warn:no Physical Aggression / Violence:No  Access to Firearms a concern: No  Gang Involvement:No   Subjective:   Jackqulyn Mendel Vanhandel participated from home, via video, is aware of limitations, and consented to treatment. Therapist participated from home office. Tameko reviewed the events of the past week. She noted experience anxiety and stress regarding the possibility of home buying. She noted the lengthy list of tasks that is required to be able to apply for a loan. She noted the necessary sacrifices that she and her husband might have to make. She noted worry that her husband would have to sacrifice his hobby and noted this thought being painful. We worked on exploring this during the session. She noted that "it would be crushing if it didn't work out" in relation to a home purchase. She noted previously having to ask her husband to discontinue a hobby in the past and was tearful during the session. She noted the effect it had on him and her. We worked on processing this during the session. We processed her overall feelings regarding the possible home purchase. We discussed the importance of communication  and joint decision-making. She noted recently reading an article about "being an empath" and noted that this really resonated with her. She noted her efforts to set and maintain boundaries with her daughter in regards to invites and reducing pressure on herself to host and the financial and emotional cost. Therapist praised Arika for her effort during the session and between sessions. Therapist validated Arrion's feelings and experience and provided supportive therapy.   Interventions:  CBT & interpersonal.   Diagnosis:  Episode of recurrent major depressive disorder, unspecified depression episode severity [facility])  Psychiatric Treatment: Yes , [name], MD.     Treatment Plan:  Client Abilities/Strengths Dawna is intelligent, self-aware, and motivated for change.   Support System: Family and friends.    Client Treatment Preferences Outpatient therapy.   Client Statement of Needs Kristie would like to focus on self-care, eating healthfully, engaging in consistent exercise, focusing on sense of self,  being more organized and goal oriented, process past events including childhood, reduce negative self-talk.   Treatment Level Weekly  Symptoms  Depression: loss of interest, feeling down, poor sleep, lethargy, fluctuating appetite, feeling bad about self, and trouble concentrating.    (Status: maintained) Anxiety:  feeling anxious, difficulty managing worry, worrying about different things, trouble relaxing, restlessness, and feeling afraid as if something awful might happen.   (Status: maintained)  Goals:   Naylah experiences symptoms of depression and anxiety.    Target Date: 08/19/23 Frequency: Weekly  Progress: 15% Modality: individual    Therapist will provide referrals for additional resources as appropriate.  Therapist will provide psycho-education regarding Ayonna's diagnosis and corresponding treatment approaches and interventions. Licensed Clinical Social Worker, [place],  LCSW will support the patient's ability to achieve the goals identified. will employ CBT, BA, Problem-solving, Solution Focused, Mindfulness,  coping skills, & other evidenced-based practices will be used to promote progress towards healthy functioning to help manage decrease symptoms associated with her diagnosis.   Reduce overall level, frequency, and intensity of the feelings of depression & anxiety and evidenced by decreased overall symptoms  from 6 to 7 days/week to 0 to 1 days/week per client report for at least 3 consecutive months. Verbally express understanding of the relationship between feelings of depression, anxiety and their impact on thinking patterns and behaviors. Verbalize an understanding of the role that distorted thinking plays in creating fears, excessive worry, and ruminations.    [name] participated in the creation of the treatment plan)  [name], LCSW

## 2023-08-12 ENCOUNTER — Ambulatory Visit
Admission: RE | Admit: 2023-08-12 | Discharge: 2023-08-12 | Disposition: A | Discharge status: Home / Self Care | Source: Ambulatory Visit | Attending: Primary Care | Admitting: Primary Care

## 2023-08-12 DIAGNOSIS — Z1231 Encounter for screening mammogram for malignant neoplasm of breast: Secondary | ICD-10-CM | POA: Insufficient documentation

## 2023-08-22 ENCOUNTER — Ambulatory Visit (INDEPENDENT_AMBULATORY_CARE_PROVIDER_SITE_OTHER): Payer: HMO | Admitting: Psychology

## 2023-08-22 DIAGNOSIS — F339 Major depressive disorder, recurrent, unspecified: Secondary | ICD-10-CM

## 2023-08-22 NOTE — Progress Notes (Addendum)
 [facility] Counselor/Therapist Progress Note  Patient ID: Brandy [name], MRN: [id number]   Date: 08/22/23  Time Spent: 12:07 pm - 1:01 pm : 54 Minutes  Treatment Type: Individual Therapy.  Reported Symptoms: depression and anxiety.  Mental Status Exam: Appearance:  Casual     Behavior: Appropriate  Motor: Normal  Speech/Language:  Clear and Coherent  Affect: Congruent  Mood: dysthymic  Thought process: normal  Thought content:   WNL  Sensory/Perceptual disturbances:   WNL  Orientation: oriented to person, place, time/date, and situation  Attention: Good  Concentration: Good  Memory: WNL  Fund of knowledge:  Good  Insight:   Good  Judgment:  Good  Impulse Control: Good   Risk Assessment: Danger to Self:  No Self-injurious Behavior: No Danger to Others: No Duty to Warn:no Physical Aggression / Violence:No  Access to Firearms a concern: No  Gang Involvement:No   Subjective:   Brandy [name] participated from home, via video, is aware of limitations, and consented to treatment. Therapist participated from home office. Yenni reviewed the events of the past week. Brandy [name] noted not celebrating the holidays as she typically has, in the past, and noted this being an adjustment for her. She noted recently coming to a realization about maladaptive relationship dynamics and choosing to disconnect, as a result. WE worked on exploring this during the session and what led to this decision. Therapist praised Brandy [name] for her effort to be mindful and make decisions based off of her values and healthy needs. She noted a recent disagreement with her husband, Ed, due to coming to an agreement about a financial issue and noted that after agreeing. She noted that he later changed the parameters of the agreement and then claimed to not recall details of the agreement. She noted getting angry at him and lashing out. We worked on exploring this during the session and the possible  contributing factors to her reaction. She noted later her husband expressing frustration regarding her use of curse words and she noted taking this poorly and responding negatively with cursing. We will continue to process this during our follow-up session and identify possible tools to aid in expressing feelings more directly and adaptively. Brandy [name] noted feeling "broken" and that she will "always be broken" in relation to her mental health history. We worked on exploring this and worked on Customer service manager. We worked on rephrasing this to be more positive and adaptive during the session. Brandy [name] practiced this during the session and noted this being a positive experience. Therapist praised Brandy [name] for her effort during the session and provided supportive therapy.  Interventions:  CBT & interpersonal.   Diagnosis:  Episode of recurrent major depressive disorder, unspecified depression episode severity [facility])  Psychiatric Treatment: Yes , Aileen Householder, MD.     Treatment Plan:  Client Abilities/Strengths Lunell is intelligent, self-aware, and motivated for change.   Support System: Family and friends.    Client Treatment Preferences Outpatient therapy.   Client Statement of Needs Lateefah would like to focus on self-care, eating healthfully, engaging in consistent exercise, focusing on sense of self,  being more organized and goal oriented, process past events including childhood, reduce negative self-talk.   Treatment Level Weekly  Symptoms  Depression: loss of interest, feeling down, poor sleep, lethargy, fluctuating appetite, feeling bad about self, and trouble concentrating.    (Status: maintained) Anxiety:  feeling anxious, difficulty managing worry, worrying about different things, trouble relaxing, restlessness, and feeling afraid as if something awful might  happen.   (Status: maintained)  Goals:   Brandy [name] experiences symptoms of depression and anxiety.    Target Date: 08/24/23 Frequency:  Weekly  Progress: 15% Modality: individual    Therapist will provide referrals for additional resources as appropriate.  Therapist will provide psycho-education regarding Brandy [name]'s diagnosis and corresponding treatment approaches and interventions. Licensed Clinical Social Worker, [place], LCSW will support the patient's ability to achieve the goals identified. will employ CBT, BA, Problem-solving, Solution Focused, Mindfulness,  coping skills, & other evidenced-based practices will be used to promote progress towards healthy functioning to help manage decrease symptoms associated with her diagnosis.   Reduce overall level, frequency, and intensity of the feelings of depression & anxiety and evidenced by decreased overall symptoms  from 6 to 7 days/week to 0 to 1 days/week per client report for at least 3 consecutive months. Verbally express understanding of the relationship between feelings of depression, anxiety and their impact on thinking patterns and behaviors. Verbalize an understanding of the role that distorted thinking plays in creating fears, excessive worry, and ruminations.    [name] participated in the creation of the treatment plan)  [name], LCSW

## 2023-08-29 ENCOUNTER — Ambulatory Visit: Payer: HMO | Admitting: Psychology

## 2023-08-29 DIAGNOSIS — F3189 Other bipolar disorder: Secondary | ICD-10-CM

## 2023-08-29 DIAGNOSIS — F3181 Bipolar II disorder: Secondary | ICD-10-CM

## 2023-08-29 NOTE — Progress Notes (Signed)
 Comprehensive Clinical Assessment (CCA) Note  08/30/2023 Nykerria Stuart Castorena [id number]  Time Spent: 12:07  pm - 1:00 pm:  Chief Complaint: No chief complaint on file.  Visit Diagnosis: Depression.    Guardian/Payee:  self.     Paperwork requested: No   Reason for Visit /Presenting Problem: depression and anxiety.   Mental Status Exam: Appearance:   Casual     Behavior:  Appropriate  Motor:  Normal  Speech/Language:   Clear and Coherent and Normal Rate  Affect:  Congruent  Mood:  normal  Thought process:  normal  Thought content:    WNL  Sensory/Perceptual disturbances:    WNL  Orientation:  oriented to person, place, time/date, and situation  Attention:  Good  Concentration:  Good  Memory:  WNL  Fund of knowledge:   Good  Insight:    Good  Judgment:   Good  Impulse Control:  Good   Reported Symptoms:  Depression & anxiety.   Risk Assessment: Danger to Self:  No Self-injurious Behavior: No Danger to Others: No Duty to Warn:no Physical Aggression / Violence:No  Access to Firearms a concern: No  Gang Involvement:No  Patient / guardian was educated about steps to take if suicide or homicide risk level increases between visits: yes While future psychiatric events cannot be accurately predicted, the patient does not currently require acute inpatient psychiatric care and does not currently meet   involuntary commitment criteria.  In case of a mental health emergency:  40 - confidential suicide hotline. Visiting [facility]):        [address], [place] [address]       [phone] 3.   911  4.   Visiting Nearest ED.   Substance Abuse History: Current substance abuse: No    Caffeine Use: 1x-2x coffee, unsweetened tea daily, and occasional iced coffee Substance Use: denied.  Alcohol Use: denied. Tobacco Use:  denied.   Past Psychiatric History:   Previous psychological history is significant for  anxiety and depression Outpatient Providers: [name], LCSW - [facility]. Aileen Householder, MD (Psychiatry)  History of Psych Hospitalization: No  Psychological Testing:  n/a    Abuse History:  Victim of: No.,  n/a    Report needed: No. Victim of Neglect:No. Perpetrator of  n/a   Witness / Exposure to Domestic Violence: No   Protective Services Involvement: No  Witness to [employer] Violence:  No   Family History:  Family History  Problem Relation Age of Onset   Rheum arthritis Mother    COPD Mother    Stroke Mother    Depression Mother    Anxiety disorder Mother    Eating disorder Mother    Obesity Mother    Hypertension Father    Depression Father    Alcoholism Father    Heart disease Maternal Grandmother    Stroke Maternal Grandmother    Alcohol abuse Maternal Grandmother    Alcohol abuse Maternal Grandfather    Aneurysm Paternal Grandmother    Autism Son        severe   Diabetes Paternal Aunt    Breast cancer Neg Hx     Living situation: the patient lives with their family  Sexual Orientation: Straight  Relationship Status: married  Name of spouse / other: Ed (35) If a parent, number of children / ages: Loetta Ringer -33, Quinn 27, Tyler 32.  Support Systems: spouse friends  Surveyor, quantity Stress:  Yes   Income/Employment/Disability: Disability  Military Service: No   Educational History: Education: some college  Religion/Sprituality/World View: Believes in God  Any cultural differences that may affect / interfere with treatment:  not applicable   Recreation/Hobbies: TBD  Stressors: Other: relationships, finances, middle child, food.    Strengths: Supportive Relationships, Self Advocate, and Able to Communicate Effectively  Barriers:  Mood, finances.    Legal History: Pending legal issue / charges: The patient has no significant history of legal issues. History of legal issue / charges:  n/a  Medical History/Surgical History:  reviewed Past Medical History:  Diagnosis Date   Anxiety    Arthritis    Back pain    Brain fog    CHF (congestive heart failure) (HCC)    pt not aware of this   Depression    Diabetes mellitus without complication (HCC)    Difficulty walking    Dysmetabolic syndrome X    Fatigue    Fibromyalgia    neuropathy   Hypertension    Hypertriglyceridemia    Joint pain    Leg weakness    Lower extremity edema    Muscle atrophy    Neuropathy    Obesity    Osteoarthritis    PONV (postoperative nausea and vomiting)    Poor memory    Post-menopausal bleeding    Prediabetes    Rosacea    Sleep apnea    not on cpap yet   Tetanus vaccine causing adverse effect in therapeutic use    Vitamin D  deficiency     Past Surgical History:  Procedure Laterality Date   BONE TUMOR EXCISION  1975   Right leg   DILATATION & CURETTAGE/HYSTEROSCOPY WITH MYOSURE N/A 10/07/2019   Procedure: DILATATION AND CURETTAGE /HYSTEROSCOPY, Myosure, Polypectomy;  Surgeon: Raynell Caller, MD;  Location: [facility] OR;  Service: Gynecology;  Laterality: N/A;   ENDOMETRIAL BIOPSY  07/14/2019       epidural steroid injection     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     atorvastatin  (LIPITOR) 10 MG tablet TAKE 1 TABLET BY MOUTH EVERY DAY FOR CHOLESTEROL 90 tablet 2   Blood Glucose Monitoring Suppl (ONETOUCH VERIO REFLECT) w/Device KIT 3 (three) times daily.     Blood Glucose Monitoring Suppl DEVI 1 each by Does not apply route in the morning, at noon, and at bedtime. May substitute to any manufacturer covered by patient's insurance. 1 each 0   cetirizine  (ZYRTEC ) 10 MG tablet  TAKE 1 TABLET (10 MG TOTAL) BY MOUTH DAILY. FOR ALLERGIES 90 tablet 1   citalopram  (CELEXA ) 40 MG tablet Take 40 mg by mouth daily.     DULoxetine  (CYMBALTA ) 30 MG capsule Take 30 mg by mouth daily. Take with 60 mg to equal 90 mg daily     DULoxetine  (CYMBALTA ) 60 MG capsule Take 60 mg by mouth daily. Take with 30 mg to equal 90 mg daily     folic acid  (FOLVITE ) 1 MG tablet Take 1 mg by mouth daily.     gabapentin  (NEURONTIN ) 300 MG capsule Take 3 capsules (900 mg total) by mouth 3 (three) times daily. For back pain 270 capsule 3   glucose blood (ONETOUCH  VERIO) test strip TEST BLOOD SUGAR MORNING, NOON AND AT BEDTIME 100 strip 3   lamoTRIgine  (LAMICTAL ) 100 MG tablet Take 100 mg by mouth every evening.     lamoTRIgine  (LAMICTAL ) 200 MG tablet Take 200 mg by mouth in the morning.     metroNIDAZOLE (METROCREAM) 0.75 % cream Apply 1 application topically daily as needed (rosacea).     Multiple Minerals-Vitamins (CALCIUM -MAGNESIUM-ZINC-D3) TABS Take 2 tablets by mouth daily.     ondansetron  (ZOFRAN -ODT) 4 MG disintegrating tablet Dissolve 1 tablet (4 mg total) by mouth every 6 (six) hours as needed for nausea or vomiting. 20 tablet 0   pantoprazole  (PROTONIX ) 40 MG tablet Take 1 tablet (40 mg total) by mouth daily. 90 tablet 0   SODIUM FLUORIDE 5000 SENSITIVE 1.1-5 % GEL See admin instructions. see package     Vitamin D , Ergocalciferol , (DRISDOL ) 1.25 MG (50000 UNIT) CAPS capsule TAKE 1 CAPSULE BY MOUTH ONCE WEEKLY FOR VITAMIN D . 12 capsule 1   No current facility-administered medications for this visit.    Allergies  Allergen Reactions   Diphenhydramine Hcl Rash    minor rash   Penicillins Rash    Diagnoses:  Other bipolar disorder (HCC) (by report)     Plan of Care: Outpatient Therapy and psychiatric treatment.   Narrative:   Amanda-Lee Gunzenhauser Checketts participated from car, via video, and consented to treatment. Therapist participated from home office and participated via video. We reviewed  confidentiality prior to the start of the evaluation and Makari expressed understanding and agreement to proceed.  This is Jaysie's annual evaluation. Ursuline continues to receive psychiatric treatment with Dr. Aileen Householder and sees her regularly. She noted a receiving a recent diagnosis of a bipolar disorder. She continues to take her medication consistently and without concern. Elene noted her current stressors include family and social relationships, worry regarding her middle son who is autistic and has high needs, the political atmosphere, finances, health, and balancing day-to-day life. She was previously screened for ADHD, which was a positive screening, but elected to not pursue this going forward. She is open to this in the future. She noted currently going through "a manic sugar need" and noted difficulty having a "hard time keeping it in control". She noted difficulty with managing this and often having difficulty setting and maintain boundaries for self regarding this. She noted "giving into the craving" and noted that this is an avenue to manage this due to difficulty setting boundaries for self.  She noted a history of hiding her consumption and noted some of this behavior recently. She noted the realization that this is not viable for many reasons including being a diabetic, being a "food addict", corresponding weight gain, and feeling sick. She noted binging during the past three days and Darica affirmed "I completely binged". She would benefit from communicating this experience to her psychiatric provider. Many of her stressors relate to interpersonal relationships with various family members and close friends, often due to difficulty communicating her feelings and frustrations in a positive, assertive, and effective manner. She noted that political differences between her and her husband, as well as her and her best friend, have recently created increased stress these relationships. Her relationship with her  daughter vacillates due to her daughter's suspected unaddressed mental health issues and general push and pull behavior. She noted worry regarding her grand-daughter and recently discussed feeling disconnected from her, for many reasons. She has been attending sessions regularly, engaging consistently, and work towards treatment goals. She  continued to communicate interest and drive towards managing her symptoms and working between sessions. She would benefit from continued therapy to address distress tolerance, verbalizing thoughts and feelings consistently and adaptively, managing day-to-day stressors, learning and employing conflict resolution skills, and managing her mood proactively. She continues to present as forthcoming and motivated for change.  We will work on creating a treatment plan during our follow-up. Therapist answered any and all questions prior to the end of the evaluation.   GAD-7: 13 PHQ-9: [address], LCSW

## 2023-09-03 DIAGNOSIS — Z1211 Encounter for screening for malignant neoplasm of colon: Secondary | ICD-10-CM | POA: Diagnosis not present

## 2023-09-05 ENCOUNTER — Ambulatory Visit: Admitting: Psychology

## 2023-09-12 ENCOUNTER — Ambulatory Visit (INDEPENDENT_AMBULATORY_CARE_PROVIDER_SITE_OTHER): Admitting: Psychology

## 2023-09-12 ENCOUNTER — Ambulatory Visit: Payer: Self-pay | Admitting: Primary Care

## 2023-09-12 ENCOUNTER — Encounter: Payer: Self-pay | Admitting: Psychology

## 2023-09-12 DIAGNOSIS — F3189 Other bipolar disorder: Secondary | ICD-10-CM | POA: Diagnosis not present

## 2023-09-12 DIAGNOSIS — R195 Other fecal abnormalities: Secondary | ICD-10-CM

## 2023-09-12 LAB — COLOGUARD: COLOGUARD: POSITIVE — AB

## 2023-09-12 NOTE — Progress Notes (Signed)
 [facility] Counselor/Therapist Progress Note  Patient ID: Brandy [name], MRN: [id number]   Date: 09/12/23  Time Spent: 12:13  pm - 1:03 pm : 50 Minutes  Treatment Type: Individual Therapy.  Reported Symptoms: Bipolar, depression, and anxiety.   Mental Status Exam: Appearance:  Casual     Behavior: Appropriate and Rigid  Motor: Normal  Speech/Language:  Clear and Coherent  Affect: Congruent  Mood: dysthymic  Thought process: normal  Thought content:   WNL  Sensory/Perceptual disturbances:   WNL  Orientation: oriented to person, place, time/date, and situation  Attention: Good  Concentration: Good  Memory: WNL  Fund of knowledge:  Good  Insight:   Fair  Judgment:  Fair  Impulse Control: Good   Risk Assessment: Danger to Self:  No Self-injurious Behavior: No Danger to Others: No Duty to Warn:no Physical Aggression / Violence:No  Access to Firearms a concern: No  Gang Involvement:No   Subjective:   Brandy [name] participated from car, via video and consented to treatment. Therapist participated from home office. We experienced technical difficulties and therapist was without video throughout the session. However, Brandy [name] video worked >75% of the session. Brandy [name] the events of the past week.   We [name] numerous treatment approaches including CBT, BA, Problem Solving, and Solution focused therapy. Psych-education regarding the Brandy [name]'s diagnosis of Other bipolar disorder (HCC) was provided during the session. We discussed Brandy [name]'s goals treatment goals which include  manage symptoms day to day, manage interpersonal relationships, prioritizing self, set boundaries for self and others,  improve time-management, manage dietary intake, engage inconsistent self-care, manage emotions and frustration tolerance being more mindful, being more purposeful, more strict adherence to medication regimen. Brandy [name] provided verbal approval of the treatment  plan. We discussed the importance of adherence to her medication regimen and the risks of a lack of adherence. Therapist encouraged Brandy [name] to communicate her concerns with her prescriber. She noted her verbal commitment towards reengaging in her medication regimen, today.   Interventions: Psycho-education & Goal Setting.   Diagnosis:  Other bipolar disorder [facility])  Psychiatric Treatment: Yes , Aileen Householder, MD.    Treatment Plan:  [name], self-aware, and motivated for change.   Support System: Family and Friend.   Client Treatment Preferences Outpatient therapy.   Client Statement of Needs Brandy [name] would like to manage symptoms day to day, manage interpersonal relationships, prioritizing self, set boundaries for self and others,  improve time-management, manage dietary intake, engage inconsistent self-care, manage emotions and frustration tolerance being more mindful, being more purposeful, more strict adherence to medication regimen.   Treatment Level Weekly  Symptoms  Anxiety: feeling anxious, difficulty managing worry, worrying too much about different things, trouble relaxing, restlessness, irritability, and feeling afraid something awful might happen.   (Status: maintained) Depression: loss of interest, feeling down, lethargy, poor appetite, feeling bad about self, and difficulty concentrating    (Status: maintained)  Goals:   Brandy [name] experiences symptoms of depression and anxiety.   Treatment plan signed and available on s-drive:   No, pending signature via MyChart.  Brandy [name] was sent the treatment plan signature form on 09/12/23.   Target Date: 09/11/24 Frequency: Weekly  Progress: 0 Modality: individual    Therapist will provide referrals for additional resources as appropriate.  Therapist will provide psycho-education regarding Brandy [name]'s diagnosis and corresponding treatment approaches and interventions. Brandy Boyden, LCSW will  support the patient's ability to achieve the goals identified. will employ CBT,  BA, Problem-solving, Solution Focused, Mindfulness,  coping skills, & other evidenced-based practices will be used to promote progress towards healthy functioning to help manage decrease symptoms associated with her diagnosis.   Reduce overall level, frequency, and intensity of the feelings of depression, anxiety and panic evidenced by decreased overall symptoms from 6 to 7 days/week to 0 to 1 days/week per client report for at least 3 consecutive months. Verbally express understanding of the relationship between feelings of depression, anxiety and their impact on thinking patterns and behaviors. Verbalize an understanding of the role that distorted thinking plays in creating fears, excessive worry, and ruminations.    Brandy [name] participated in the creation of the treatment plan)   Brandy Boyden, LCSW

## 2023-09-18 NOTE — Addendum Note (Signed)
 Addended by: Jonne Rote on: 09/18/2023 02:21 PM   Modules accepted: Level of Service

## 2023-09-19 ENCOUNTER — Ambulatory Visit (INDEPENDENT_AMBULATORY_CARE_PROVIDER_SITE_OTHER): Admitting: Psychology

## 2023-09-19 DIAGNOSIS — F3189 Other bipolar disorder: Secondary | ICD-10-CM

## 2023-09-19 NOTE — Progress Notes (Signed)
 [facility] Counselor/Therapist Progress Note  Patient ID: NERINE PULSE, MRN: [id number]   Date: 09/19/23  Time Spent: 12:05  pm - 1:01 pm : 56 Minutes  Treatment Type: Individual Therapy.  Reported Symptoms: Bipolar, depression, and anxiety.   Mental Status Exam: Appearance:  Casual     Behavior: Appropriate and Rigid  Motor: Normal  Speech/Language:  Clear and Coherent  Affect: Congruent  Mood: dysthymic  Thought process: normal  Thought content:   WNL  Sensory/Perceptual disturbances:   WNL  Orientation: oriented to person, place, time/date, and situation  Attention: Good  Concentration: Good  Memory: WNL  Fund of knowledge:  Good  Insight:   Fair  Judgment:  Fair  Impulse Control: Good   Risk Assessment: Danger to Self:  No Self-injurious Behavior: No Danger to Others: No Duty to Warn:no Physical Aggression / Violence:No  Access to Firearms a concern: No  Gang Involvement:No   Subjective:   Cayden Rautio Terpening participated from car, via video and consented to treatment. Therapist participated from home office. We experienced technical difficulties and therapist was without video throughout the session. However, Jackquline's video worked >75% of the session. Intisar reviewed the events of the past week. She noted continued difficulty with adhering to her medication regimen. She noted not thinking about it. We worked on exploring this and worked on problem solving this via moving her medication and use of reminders. She noted relationship dynamics with her father who often is self-focused and noted that his behavior "gets under my skin". She provided historical background of her paternal family including mental health and substance use history. She noted that her father did not treat her mother well. She noted having to balance family history, managing her own mental health, while giving her father what he needs in his final years of life. She noted this being a  difficult balance. We worked one exploring this during the session. Therapist encouraged Kassey to continue identify boundaries and engage in mindfulness of her need. Therapist validated Carys's experience and feelings during the session. Therapist provided supportive therapy. She noted a need to focus on eating triggers during our follow-up appointment.    Interventions: CBT  Diagnosis:  Other bipolar disorder [facility])  Psychiatric Treatment: Yes , Aileen Householder, MD.    Treatment Plan:  Client Abilities/Strengths Geryl is intelligent, self-aware, and motivated for change.   Support System: Family and Friend.   Client Treatment Preferences Outpatient therapy.   Client Statement of Needs Vlada would like to manage symptoms day to day, manage interpersonal relationships, prioritizing self, set boundaries for self and others,  improve time-management, manage dietary intake, engage inconsistent self-care, manage emotions and frustration tolerance being more mindful, being more purposeful, more strict adherence to medication regimen.   Treatment Level Weekly  Symptoms  Anxiety: feeling anxious, difficulty managing worry, worrying too much about different things, trouble relaxing, restlessness, irritability, and feeling afraid something awful might happen.   (Status: maintained) Depression: loss of interest, feeling down, lethargy, poor appetite, feeling bad about self, and difficulty concentrating    (Status: maintained)  Goals:   Kimbley experiences symptoms of depression and anxiety.   Treatment plan signed and available on s-drive:   Yes, please see patient chart for sign off.  Eryka Dolinger Bellissimo was sent the treatment plan signature form on 09/12/23.   Target Date: 09/11/24 Frequency: Weekly  Progress: 0 Modality: individual    Therapist will provide referrals for additional resources as appropriate.  Therapist will provide  psycho-education regarding Inaya's diagnosis and corresponding  treatment approaches and interventions. [name], LCSW will support the patient's ability to achieve the goals identified. will employ CBT, BA, Problem-solving, Solution Focused, Mindfulness,  coping skills, & other evidenced-based practices will be used to promote progress towards healthy functioning to help manage decrease symptoms associated with her diagnosis.   Reduce overall level, frequency, and intensity of the feelings of depression, anxiety and panic evidenced by decreased overall symptoms from 6 to 7 days/week to 0 to 1 days/week per client report for at least 3 consecutive months. Verbally express understanding of the relationship between feelings of depression, anxiety and their impact on thinking patterns and behaviors. Verbalize an understanding of the role that distorted thinking plays in creating fears, excessive worry, and ruminations.    [name] participated in the creation of the treatment plan)   [name], LCSW

## 2023-09-26 ENCOUNTER — Ambulatory Visit: Admitting: Psychology

## 2023-09-26 DIAGNOSIS — F3189 Other bipolar disorder: Secondary | ICD-10-CM | POA: Diagnosis not present

## 2023-09-26 NOTE — Progress Notes (Signed)
 [facility] Counselor/Therapist Progress Note  Patient ID: Brandy [name], MRN: [id number]   Date: 09/26/23  Time Spent: 12:07  pm - 1:01 pm : 54 Minutes  Treatment Type: Individual Therapy.  Reported Symptoms: Bipolar, depression, and anxiety.   Mental Status Exam: Appearance:  Casual     Behavior: Appropriate and Rigid  Motor: Normal  Speech/Language:  Clear and Coherent  Affect: Congruent  Mood: dysthymic  Thought process: normal  Thought content:   WNL  Sensory/Perceptual disturbances:   WNL  Orientation: oriented to person, place, time/date, and situation  Attention: Good  Concentration: Good  Memory: WNL  Fund of knowledge:  Good  Insight:   Fair  Judgment:  Fair  Impulse Control: Good   Risk Assessment: Danger to Self:  No Self-injurious Behavior: No Danger to Others: No Duty to Warn:no Physical Aggression / Violence:No  Access to Firearms a concern: No  Gang Involvement:No   Subjective:   Brandy [name] participated from car, via video and consented to treatment. Therapist participated from home office. Brandy [name] noted that her dad "isn't doing great" health wise. Brandy [name] noted that her father is often "confused" and often calls her when he is frustrated or upset. Brandy [name] noted her father "has to feel so special". Brandy [name] noted that his behavior is often triggering her and noted having to "remove myself from it". Brandy [name] noted having to work on acceptance while maintaining her boundaries for self regarding her father's behaviors as well as other people's behavior that. Brandy [name] noted that Brandy [name] is not very close with her dad. Brandy [name] noted her father's upcoming birthday and asking for suggestions from his wife about a gift and noted the difficulty with this suggestion. We worked on processing this during the session. Brandy [name] noted her effort to identify how to reflect positively on her experience with her father in childhood. Brandy [name] noted her intent to work on dietary changes starting June  1st. Therapist inquired about the barriers to adherence prior to June 1st. We reviewed her adherence to her medication regimen and Brandy [name] continues to struggle with this. We worked on identifying barriers regarding this. Brandy [name] has not taken her medication for ~3-4 weeks. Brandy [name] noted being slated to meet with her psychiatric provider. Therapist strongly encouraged Brandy [name] to communicate concerns and barriers to her provider. Therapist discussed the risks associated with lack of adherence. Therapist validated Brandy [name]'s feelings and experience in relation to her dynamics with her father, encouraged adherence to medication management and communicating with her provider, and provided supportive therapy.   Interventions: CBT & interpersonal.   Diagnosis:  Other bipolar disorder [facility])  Psychiatric Treatment: Yes , Aileen Householder, MD.    Treatment Plan:  Client Abilities/Strengths Truth is intelligent, self-aware, and motivated for change.   Support System: Family and Friend.   Client Treatment Preferences Outpatient therapy.   Client Statement of Needs Brandy [name] would like to manage symptoms day to day, manage interpersonal relationships, prioritizing self, set boundaries for self and others,  improve time-management, manage dietary intake, engage inconsistent self-care, manage emotions and frustration tolerance being more mindful, being more purposeful, more strict adherence to medication regimen.   Treatment Level Weekly  Symptoms  Anxiety: feeling anxious, difficulty managing worry, worrying too much about different things, trouble relaxing, restlessness, irritability, and feeling afraid something awful might happen.   (Status: maintained) Depression: loss of interest, feeling down, lethargy, poor appetite, feeling bad about self, and difficulty concentrating    (Status: maintained)  Goals:   Brandy [name] experiences symptoms  of depression and anxiety.   Treatment plan signed and available on s-drive:   Yes,  please see patient chart for sign off.  Brandy [name] was sent the treatment plan signature form on 09/12/23.   Target Date: 09/11/24 Frequency: Weekly  Progress: 0 Modality: individual    Therapist will provide referrals for additional resources as appropriate.  Therapist will provide psycho-education regarding Brandy [name]'s diagnosis and corresponding treatment approaches and interventions. Brandy [name], Brandy [name] will support the patient's ability to achieve the goals identified. will employ CBT, BA, Problem-solving, Solution Focused, Mindfulness,  coping skills, & other evidenced-based practices will be used to promote progress towards healthy functioning to help manage decrease symptoms associated with her diagnosis.   Reduce overall level, frequency, and intensity of the feelings of depression, anxiety and panic evidenced by decreased overall symptoms from 6 to 7 days/week to 0 to 1 days/week per client report for at least 3 consecutive months. Verbally express understanding of the relationship between feelings of depression, anxiety and their impact on thinking patterns and behaviors. Verbalize an understanding of the role that distorted thinking plays in creating fears, excessive worry, and ruminations.    Brandy [name] participated in the creation of the treatment plan)   Brandy [name], Brandy [name]

## 2023-10-02 DIAGNOSIS — R195 Other fecal abnormalities: Secondary | ICD-10-CM | POA: Diagnosis not present

## 2023-10-03 ENCOUNTER — Ambulatory Visit (INDEPENDENT_AMBULATORY_CARE_PROVIDER_SITE_OTHER): Admitting: Psychology

## 2023-10-03 DIAGNOSIS — F3189 Other bipolar disorder: Secondary | ICD-10-CM | POA: Diagnosis not present

## 2023-10-03 NOTE — Progress Notes (Signed)
 [name] Counselor/Therapist Progress Note  Patient ID: Brandy [name], MRN: [id number]   Date: 10/03/23  Time Spent: 12:05  pm - 1:03 pm : 58 Minutes  Treatment Type: Individual Therapy.  Reported Symptoms: Bipolar, depression, and anxiety.   Mental Status Exam: Appearance:  Casual     Behavior: Appropriate and Rigid  Motor: Normal  Speech/Language:  Clear and Coherent  Affect: Congruent  Mood: dysthymic and irritable  Thought process: normal  Thought content:   WNL  Sensory/Perceptual disturbances:   WNL  Orientation: oriented to person, place, time/date, and situation  Attention: Good  Concentration: Good  Memory: WNL  Fund of knowledge:  Good  Insight:   Fair  Judgment:  Fair  Impulse Control: Good   Risk Assessment: Danger to Self:  No Self-injurious Behavior: No Danger to Others: No Duty to Warn:no Physical Aggression / Violence:No  Access to Firearms a concern: No  Gang Involvement:No   Subjective:   Brandy [name] participated from car, via video and consented to treatment. Therapist participated from home office. Brandy [name] frustration regarding the end of the previous session due to a lack of commitment from therapist regarding a request. We worked on explored this during the session and therapist requested feedback regarding the request. [name] a need for a more direct answer. Therapist discussed a therapist's clinical duties and responsibilities and the role of the therapist and that clinical decisions . We worked on processing this during the session. Therapist delineated roles of patient and therapist. We worked on identifying feelings related to her request and discussed Brandy [name]'s avoidance of addressing this issue that was previously discussed in which we engaged in problem-solving and Brandy [name] provided verbal commitment towards making the necessary changes. We explored this avoidance. [name] worry regarding this issue affecting her in others  areas of day-to-day life and we worked on exploring this. Therapist validated Brandy [name]'s feelings and experience, challenging negative thoughts and feelings, delineated the role of patient and therapist, and provided supportive therapy. Brandy [name] [name] commitment towards addressing this issue going forward. Therapist provided supportive therapy. A follow-up was scheduled for continued treatment.   Interventions: CBT & interpersonal.   Diagnosis:  Other bipolar disorder [facility])  Psychiatric Treatment: Yes , Aileen Householder, MD.    Treatment Plan:  [name], self-aware, and motivated for change.   Support System: Family and Friend.   Client Treatment Preferences Outpatient therapy.   Client Statement of Needs Brandy [name] would like to manage symptoms day to day, manage interpersonal relationships, prioritizing self, set boundaries for self and others,  improve time-management, manage dietary intake, engage inconsistent self-care, manage emotions and frustration tolerance being more mindful, being more purposeful, more strict adherence to medication regimen.   Treatment Level Weekly  Symptoms  Anxiety: feeling anxious, difficulty managing worry, worrying too much about different things, trouble relaxing, restlessness, irritability, and feeling afraid something awful might happen.   (Status: maintained) Depression: loss of interest, feeling down, lethargy, poor appetite, feeling bad about self, and difficulty concentrating    (Status: maintained)  Goals:   Brandy [name] experiences symptoms of depression and anxiety.   Treatment plan signed and available on s-drive:   Yes, please see patient chart for sign off.  Brandy [name] was sent the treatment plan signature form on 09/12/23.   Target Date: 09/11/24 Frequency: Weekly  Progress: 0 Modality: individual    Therapist will provide referrals for additional resources as appropriate.  Therapist will provide  psycho-education regarding  Brandy [name] diagnosis and corresponding treatment approaches and interventions. [name], LCSW will support the patient's ability to achieve the goals identified. will employ CBT, BA, Problem-solving, Solution Focused, Mindfulness,  coping skills, & other evidenced-based practices will be used to promote progress towards healthy functioning to help manage decrease symptoms associated with her diagnosis.   Reduce overall level, frequency, and intensity of the feelings of depression, anxiety and panic evidenced by decreased overall symptoms from 6 to 7 days/week to 0 to 1 days/week per client report for at least 3 consecutive months. Verbally express understanding of the relationship between feelings of depression, anxiety and their impact on thinking patterns and behaviors. Verbalize an understanding of the role that distorted thinking plays in creating fears, excessive worry, and ruminations.    [name] participated in the creation of the treatment plan)   [name], LCSW

## 2023-10-09 DIAGNOSIS — F3181 Bipolar II disorder: Secondary | ICD-10-CM | POA: Diagnosis not present

## 2023-10-09 DIAGNOSIS — R41840 Attention and concentration deficit: Secondary | ICD-10-CM | POA: Diagnosis not present

## 2023-10-09 DIAGNOSIS — F5105 Insomnia due to other mental disorder: Secondary | ICD-10-CM | POA: Diagnosis not present

## 2023-10-09 DIAGNOSIS — F411 Generalized anxiety disorder: Secondary | ICD-10-CM | POA: Diagnosis not present

## 2023-10-10 ENCOUNTER — Ambulatory Visit: Admitting: Psychology

## 2023-10-10 DIAGNOSIS — F3189 Other bipolar disorder: Secondary | ICD-10-CM

## 2023-10-10 DIAGNOSIS — F339 Major depressive disorder, recurrent, unspecified: Secondary | ICD-10-CM

## 2023-10-10 NOTE — Progress Notes (Signed)
 [facility] Counselor/Therapist Progress Note  Patient ID: OUIDA ABEYTA, MRN: [id number]   Date: 10/10/23  Time Spent: 12:06  pm - 1:03 pm : 57 Minutes  Treatment Type: Individual Therapy.  Reported Symptoms: Bipolar, depression, and anxiety.   Mental Status Exam: Appearance:  Casual     Behavior: Appropriate and Rigid  Motor: Normal  Speech/Language:  Clear and Coherent  Affect: Congruent  Mood: dysthymic and irritable  Thought process: normal  Thought content:   WNL  Sensory/Perceptual disturbances:   WNL  Orientation: oriented to person, place, time/date, and situation  Attention: Good  Concentration: Good  Memory: WNL  Fund of knowledge:  Good  Insight:   Fair  Judgment:  Fair  Impulse Control: Good   Risk Assessment: Danger to Self:  No Self-injurious Behavior: No Danger to Others: No Duty to Warn:no Physical Aggression / Violence:No  Access to Firearms a concern: No  Gang Involvement:No   Subjective:   Brandy [name] participated from home, via video and consented to treatment. Therapist participated from home office. She noted the events of the past week. Brandy [name] noted continued frustration regarding specific behavior by her father. We processed this during the session. She noted her father's overt intimidation during her childhood. She noted highlighting her father's behavior when it was directed towards her stepbrother, and her mother, prior to their divorce. She noted her father's behaviors being triggers for her and noted similar behaviors towards her step-mother at this time. She noted the feeling that her father often blames his wife for incidental events or accidents. She noted that her father consistently frames responsibility to others and sole focus of conversation on self. She provided feedback regarding dynamics in her parents' relationship during her childhood. She noted often her family (parents) bragging about her at family events and  highlighting this being gross. She noted how these dynamics with her father, particularly blaming others, played a part in her adulthood when her husband has assigned her blame for an event or experience. We worked on processing this during the session. She noted that giving feedback to her father including setting boundaries communicates that Brandy [name] see's his behavior. We explored the drive regarding how she responds to her father's behavior. Therapist praised Brandy [name] for her vulnerability and effort to work on feeling identification. Therapist validated Brandy [name]'s feelings and experience and provided supportive therapy.    Interventions: CBT & interpersonal.   Diagnosis:  Other bipolar disorder (HCC)  Episode of recurrent major depressive disorder, unspecified depression episode severity [facility])  Psychiatric Treatment: Yes , Aileen Householder, MD.    Treatment Plan:  [name], self-aware, and motivated for change.   Support System: Family and Friend.   Client Treatment Preferences Outpatient therapy.   Client Statement of Needs Brandy [name] would like to manage symptoms day to day, manage interpersonal relationships, prioritizing self, set boundaries for self and others,  improve time-management, manage dietary intake, engage inconsistent self-care, manage emotions and frustration tolerance being more mindful, being more purposeful, more strict adherence to medication regimen.   Treatment Level Weekly  Symptoms  Anxiety: feeling anxious, difficulty managing worry, worrying too much about different things, trouble relaxing, restlessness, irritability, and feeling afraid something awful might happen.   (Status: maintained) Depression: loss of interest, feeling down, lethargy, poor appetite, feeling bad about self, and difficulty concentrating    (Status: maintained)  Goals:   Brandy [name] experiences symptoms of depression and anxiety.   Treatment plan signed and  available on  s-drive:   Yes, please see patient chart for sign off.  Brandy [name] was sent the treatment plan signature form on 09/12/23.   Target Date: 09/11/24 Frequency: Weekly  Progress: 0 Modality: individual    Therapist will provide referrals for additional resources as appropriate.  Therapist will provide psycho-education regarding Brandy [name]'s diagnosis and corresponding treatment approaches and interventions. [name], LCSW will support the patient's ability to achieve the goals identified. will employ CBT, BA, Problem-solving, Solution Focused, Mindfulness,  coping skills, & other evidenced-based practices will be used to promote progress towards healthy functioning to help manage decrease symptoms associated with her diagnosis.   Reduce overall level, frequency, and intensity of the feelings of depression, anxiety and panic evidenced by decreased overall symptoms from 6 to 7 days/week to 0 to 1 days/week per client report for at least 3 consecutive months. Verbally express understanding of the relationship between feelings of depression, anxiety and their impact on thinking patterns and behaviors. Verbalize an understanding of the role that distorted thinking plays in creating fears, excessive worry, and ruminations.    Brandy [name] participated in the creation of the treatment plan)   [name], LCSW

## 2023-10-17 ENCOUNTER — Ambulatory Visit (INDEPENDENT_AMBULATORY_CARE_PROVIDER_SITE_OTHER): Admitting: Psychology

## 2023-10-17 DIAGNOSIS — F3189 Other bipolar disorder: Secondary | ICD-10-CM | POA: Diagnosis not present

## 2023-10-17 NOTE — Progress Notes (Signed)
 [facility] Counselor/Therapist Progress Note  Patient ID: Brandy [name], MRN: [id number]   Date: 10/17/23  Time Spent: 12:14  pm - 1:03 pm : 49 Minutes  Treatment Type: Individual Therapy.  Reported Symptoms: Bipolar, depression, and anxiety.   Mental Status Exam: Appearance:  Casual     Behavior: Appropriate and Rigid  Motor: Normal  Speech/Language:  Clear and Coherent  Affect: Congruent  Mood: dysthymic  Thought process: normal  Thought content:   WNL  Sensory/Perceptual disturbances:   WNL  Orientation: oriented to person, place, time/date, and situation  Attention: Good  Concentration: Good  Memory: WNL  Fund of knowledge:  Good  Insight:   Fair  Judgment:  Fair  Impulse Control: Good   Risk Assessment: Danger to Self:  No Self-injurious Behavior: No Danger to Others: No Duty to Warn:no Physical Aggression / Violence:No  Access to Firearms a concern: No  Gang Involvement:No   Subjective:   Brandy [name] participated from home, via video and consented to treatment. Therapist participated from home office. She noted the events of the past week. Brandy [name] noted being late to the appointment due to taking a nap. She noted a recent change in her schedule that has contributed to this. She noted concerns regarding his father's cognitive functioning and noted her father making various statements that are difficult to comprehend. We worked on processing this during the session. She noted reflecting on her relationship with her father, to send a father's day card, and noted this resulting in her being more resentful. She described her father as an absent father. She noted concern that her father could have recently experienced a stroke. We worked on processing this during the session and her father's general lack clear communication. She noted a lack of clarity from her step-mother, as well, regarding the health status of Brandy [name]'s father. She noted that her  step-mother has been unsupportive of Brandy [name]. We worked on processing these dynamics during the session. She noted her effort to have positive dynamics with her father and step-mother can negatively affect her mental health. We worked on processing this during the session. We worked on reviewing her coping skills during the session. Therapist validated Brandy [name]'s feelings and experience, encouraged continued self-care, and provided supportive therapy.   Interventions: CBT & interpersonal.   Diagnosis:  Other bipolar disorder [facility])  Psychiatric Treatment: Yes , Aileen Householder, MD.    Treatment Plan:  [name], self-aware, and motivated for change.   Support System: Family and Friend.   Client Treatment Preferences Outpatient therapy.   Client Statement of Needs Brandy [name] would like to manage symptoms day to day, manage interpersonal relationships, prioritizing self, set boundaries for self and others,  improve time-management, manage dietary intake, engage inconsistent self-care, manage emotions and frustration tolerance being more mindful, being more purposeful, more strict adherence to medication regimen.   Treatment Level Weekly  Symptoms  Anxiety: feeling anxious, difficulty managing worry, worrying too much about different things, trouble relaxing, restlessness, irritability, and feeling afraid something awful might happen.   (Status: maintained) Depression: loss of interest, feeling down, lethargy, poor appetite, feeling bad about self, and difficulty concentrating    (Status: maintained)  Goals:   Brandy [name] experiences symptoms of depression and anxiety.   Treatment plan signed and available on s-drive:   Yes, please see patient chart for sign off.  Brandy [name] was sent the treatment plan signature form on 09/12/23.   Target Date: 09/11/24 Frequency: Weekly  Progress: 0 Modality: individual    Therapist will provide referrals for additional  resources as appropriate.  Therapist will provide psycho-education regarding Brandy [name]'s diagnosis and corresponding treatment approaches and interventions. [name], LCSW will support the patient's ability to achieve the goals identified. will employ CBT, BA, Problem-solving, Solution Focused, Mindfulness,  coping skills, & other evidenced-based practices will be used to promote progress towards healthy functioning to help manage decrease symptoms associated with her diagnosis.   Reduce overall level, frequency, and intensity of the feelings of depression, anxiety and panic evidenced by decreased overall symptoms from 6 to 7 days/week to 0 to 1 days/week per client report for at least 3 consecutive months. Verbally express understanding of the relationship between feelings of depression, anxiety and their impact on thinking patterns and behaviors. Verbalize an understanding of the role that distorted thinking plays in creating fears, excessive worry, and ruminations.    Brandy [name] participated in the creation of the treatment plan)   [name], LCSW

## 2023-10-22 ENCOUNTER — Ambulatory Visit
Admission: RE | Admit: 2023-10-22 | Discharge: 2023-10-22 | Disposition: A | Discharge status: Home / Self Care | Attending: Internal Medicine | Admitting: Internal Medicine

## 2023-10-22 ENCOUNTER — Encounter: Payer: Self-pay | Admitting: Internal Medicine

## 2023-10-22 ENCOUNTER — Ambulatory Visit: Admitting: Certified Registered"

## 2023-10-22 ENCOUNTER — Other Ambulatory Visit: Payer: Self-pay

## 2023-10-22 ENCOUNTER — Encounter
Admission: RE | Disposition: A | Discharge status: Home / Self Care | Payer: Self-pay | Source: Home / Self Care | Attending: Internal Medicine

## 2023-10-22 DIAGNOSIS — K640 First degree hemorrhoids: Secondary | ICD-10-CM | POA: Insufficient documentation

## 2023-10-22 DIAGNOSIS — K648 Other hemorrhoids: Secondary | ICD-10-CM | POA: Diagnosis not present

## 2023-10-22 DIAGNOSIS — I5031 Acute diastolic (congestive) heart failure: Secondary | ICD-10-CM | POA: Diagnosis not present

## 2023-10-22 DIAGNOSIS — K219 Gastro-esophageal reflux disease without esophagitis: Secondary | ICD-10-CM | POA: Insufficient documentation

## 2023-10-22 DIAGNOSIS — Z1211 Encounter for screening for malignant neoplasm of colon: Secondary | ICD-10-CM | POA: Insufficient documentation

## 2023-10-22 DIAGNOSIS — K5730 Diverticulosis of large intestine without perforation or abscess without bleeding: Secondary | ICD-10-CM | POA: Insufficient documentation

## 2023-10-22 DIAGNOSIS — Q438 Other specified congenital malformations of intestine: Secondary | ICD-10-CM | POA: Diagnosis not present

## 2023-10-22 DIAGNOSIS — I509 Heart failure, unspecified: Secondary | ICD-10-CM | POA: Insufficient documentation

## 2023-10-22 DIAGNOSIS — I110 Hypertensive heart disease with heart failure: Secondary | ICD-10-CM | POA: Insufficient documentation

## 2023-10-22 DIAGNOSIS — R195 Other fecal abnormalities: Secondary | ICD-10-CM | POA: Insufficient documentation

## 2023-10-22 DIAGNOSIS — E1151 Type 2 diabetes mellitus with diabetic peripheral angiopathy without gangrene: Secondary | ICD-10-CM | POA: Insufficient documentation

## 2023-10-22 DIAGNOSIS — G4730 Sleep apnea, unspecified: Secondary | ICD-10-CM | POA: Diagnosis not present

## 2023-10-22 HISTORY — PX: COLONOSCOPY: SHX5424

## 2023-10-22 LAB — GLUCOSE, CAPILLARY: Glucose-Capillary: 133 mg/dL — ABNORMAL HIGH (ref 70–99)

## 2023-10-22 SURGERY — COLONOSCOPY
Anesthesia: General

## 2023-10-22 MED ORDER — LIDOCAINE HCL (CARDIAC) PF 100 MG/5ML IV SOSY
PREFILLED_SYRINGE | INTRAVENOUS | Status: DC | PRN
  Administered 2023-10-22: 70 mg via INTRAVENOUS

## 2023-10-22 MED ORDER — SODIUM CHLORIDE 0.9 % IV SOLN: INTRAVENOUS | Status: DC

## 2023-10-22 MED ORDER — PROPOFOL 500 MG/50ML IV EMUL
INTRAVENOUS | Status: DC | PRN
  Administered 2023-10-22: 100 ug/kg/min via INTRAVENOUS

## 2023-10-22 MED ORDER — PROPOFOL 10 MG/ML IV BOLUS
INTRAVENOUS | Status: DC | PRN
  Administered 2023-10-22: 70 mg via INTRAVENOUS
  Administered 2023-10-22: 20 mg via INTRAVENOUS

## 2023-10-22 NOTE — Op Note (Signed)
 [facility] Gastroenterology Patient Name: Brandy [name] Procedure Date: 10/22/2023 12:28 PM MRN: [id number] Account #: [id number] Date of Birth: [birth year] Admit Type: Outpatient Age: 58 Room: [facility] ENDO ROOM 1 Gender: Female Note Status: Finalized Instrument Name: Arvis [id number] Procedure:             Colonoscopy Indications:           Positive Cologuard test Providers:             Tammela Bales K. Aundria MD, MD Referring MD:          Comer LOIS Gaskins (Referring MD) Medicines:             Propofol  per Anesthesia Complications:         No immediate complications. Estimated blood loss: None. Procedure:             Pre-Anesthesia Assessment:                        - The risks and benefits of the procedure and the                         sedation options and risks were discussed with the                         patient. All questions were answered and informed                         consent was obtained.                        - Patient identification and proposed procedure were                         verified prior to the procedure by the nurse. The                         procedure was verified in the procedure room.                        - ASA Grade Assessment: III - A patient with severe                         systemic disease.                        - After reviewing the risks and benefits, the patient                         was deemed in satisfactory condition to undergo the                         procedure.                        After obtaining informed consent, the colonoscope was                         passed under direct vision. Throughout the procedure,  the patient's blood pressure, pulse, and oxygen                         saturations were monitored continuously. The                         Colonoscope was introduced through the anus and                         advanced to the the cecum, identified by appendiceal                          orifice and ileocecal valve. The colonoscopy was                         somewhat difficult due to unusual anatomy. Successful                         completion of the procedure was aided by using scope                         torsion. The patient tolerated the procedure well. The                         quality of the bowel preparation was excellent. The                         ileocecal valve, appendiceal orifice, and rectum were                         photographed. Findings:      The perianal and digital rectal examinations were normal. Pertinent       negatives include normal sphincter tone and no palpable rectal lesions.      Non-bleeding internal hemorrhoids were found during retroflexion. The       hemorrhoids were Grade I (internal hemorrhoids that do not prolapse).      The colon (entire examined portion) was moderately redundant. Advancing       the scope required using scope torsion. Estimated blood loss: none.      Normal mucosa was found in the entire colon.      There is no endoscopic evidence of bleeding, inflammation, mass or       polyps in the entire colon.      A few small-mouthed diverticula were found in the sigmoid colon. Impression:            - Non-bleeding internal hemorrhoids.                        - Redundant colon.                        - Normal mucosa in the entire examined colon.                        - Diverticulosis in the sigmoid colon.                        - No specimens collected. Recommendation:        - Patient has a  contact number available for                         emergencies. The signs and symptoms of potential                         delayed complications were discussed with the patient.                         Return to normal activities tomorrow. Written                         discharge instructions were provided to the patient.                        - Resume previous diet.                        - Continue present  medications.                        - Repeat colonoscopy in 10 years for screening                         purposes.                        - Return to GI office PRN.                        - The findings and recommendations were discussed with                         the patient. Procedure Code(s):     --- Professional ---                        [phone], Colonoscopy, flexible; diagnostic, including                         collection of specimen(s) by brushing or washing, when                         performed (separate procedure) Diagnosis Code(s):     --- Professional ---                        Q43.8, Other specified congenital malformations of                         intestine                        K57.30, Diverticulosis of large intestine without                         perforation or abscess without bleeding                        R19.5, Other fecal abnormalities                        K64.0, First degree hemorrhoids CPT copyright 2022 American Medical  Association. All rights reserved. The codes documented in this report are preliminary and upon coder review may  be revised to meet current compliance requirements. Ladell MARLA Boss MD, MD 10/22/2023 12:53:23 PM This report has been signed electronically. Number of Addenda: 0 Note Initiated On: 10/22/2023 12:28 PM Scope Withdrawal Time: 0 hours 5 minutes 29 seconds  Total Procedure Duration: 0 hours 14 minutes 5 seconds  Estimated Blood Loss:  Estimated blood loss: none. Estimated blood loss: none.      [facility]

## 2023-10-22 NOTE — Transfer of Care (Signed)
 Immediate Anesthesia Transfer of Care Note  Patient: Brandy [name]  Procedure(s) Performed: COLONOSCOPY  Patient Location: PACU  Anesthesia Type:General  Level of Consciousness: awake  Airway & Oxygen Therapy: Patient Spontanous Breathing and Patient connected to nasal cannula oxygen  Post-op Assessment: Report given to RN and Post -op Vital signs reviewed and stable  Post vital signs: Reviewed and stable  Last Vitals: 02 D/Cd. Normal temp .  See pacu flow sheet   Vitals Value Taken Time  BP 113/68 10/22/23 12:52  Temp    Pulse 74 10/22/23 12:52  Resp 18 10/22/23 12:52  SpO2 100 % 10/22/23 12:52  Vitals shown include unfiled device data.  Last Pain:  Vitals:   10/22/23 1043  TempSrc: Temporal  PainSc: 3       Patients Stated Pain Goal: 0 (10/22/23 1043)  Complications: No notable events documented.

## 2023-10-22 NOTE — Anesthesia Preprocedure Evaluation (Signed)
 Anesthesia Evaluation  Patient identified by MRN, date of birth, ID band Patient awake    Reviewed: Allergy & Precautions, NPO status , Patient's Chart, lab work & pertinent test results  History of Anesthesia Complications (+) PONV and history of anesthetic complications  Airway Mallampati: III  TM Distance: <3 FB Neck ROM: full    Dental  (+) Chipped, Poor Dentition, Missing, Loose   Pulmonary neg shortness of breath, sleep apnea    Pulmonary exam normal        Cardiovascular Exercise Tolerance: Good hypertension, + Peripheral Vascular Disease and +CHF  Normal cardiovascular exam     Neuro/Psych  PSYCHIATRIC DISORDERS       Neuromuscular disease    GI/Hepatic Neg liver ROS,GERD  Controlled,,  Endo/Other  negative endocrine ROSdiabetes, Type 2    Renal/GU negative Renal ROS  negative genitourinary   Musculoskeletal   Abdominal   Peds  Hematology negative hematology ROS (+)   Anesthesia Other Findings Past Medical History: No date: Anxiety No date: Arthritis No date: Back pain No date: Brain fog No date: CHF (congestive heart failure) (HCC)     Comment:  pt not aware of this No date: Depression No date: Diabetes mellitus without complication (HCC) No date: Difficulty walking No date: Dysmetabolic syndrome X No date: Fatigue No date: Fibromyalgia     Comment:  neuropathy No date: Hypertension No date: Hypertriglyceridemia No date: Joint pain No date: Leg weakness No date: Lower extremity edema No date: Muscle atrophy No date: Neuropathy No date: Obesity No date: Osteoarthritis No date: PONV (postoperative nausea and vomiting) No date: Poor memory No date: Post-menopausal bleeding No date: Prediabetes No date: Rosacea No date: Sleep apnea     Comment:  not on cpap yet No date: Tetanus vaccine causing adverse effect in therapeutic use No date: Vitamin D  deficiency  Past Surgical History: 1975:  BONE TUMOR EXCISION     Comment:  Right leg 10/07/2019: DILATATION & CURETTAGE/HYSTEROSCOPY WITH MYOSURE; N/A     Comment:  Procedure: DILATATION AND CURETTAGE /HYSTEROSCOPY,               Myosure, Polypectomy;  Surgeon: Izell Harari, MD;                Location: [facility] OR;  Service: Gynecology;  Laterality: N/A; 07/14/2019: ENDOMETRIAL BIOPSY     Comment:    No date: epidural steroid injection 04/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; N/A     Comment:  Procedure: LAPAROSCOPIC ROUX-EN-Y GASTRIC BYPASS WITH               UPPER ENDOSCOPY;  Surgeon: Kinsinger, Herlene Righter, MD;                Location: [facility] ORS;  Service: General;  Laterality: N/A;  BMI    Body Mass Index: 41.43 kg/m      Reproductive/Obstetrics negative OB ROS                             Anesthesia Physical Anesthesia Plan  ASA: 3  Anesthesia Plan: General   Post-op Pain Management:    Induction: Intravenous  PONV Risk Score and Plan: Propofol  infusion and TIVA  Airway Management Planned: Natural Airway and Nasal Cannula  Additional Equipment:   Intra-op Plan:   Post-operative Plan:   Informed Consent: I have reviewed the patients History and Physical, chart, labs and discussed the procedure including the risks, benefits and alternatives for the proposed anesthesia  with the patient or authorized representative who has indicated his/her understanding and acceptance.     Dental Advisory Given  Plan Discussed with: Anesthesiologist, CRNA and Surgeon  Anesthesia Plan Comments: (Patient consented for risks of anesthesia including but not limited to:  - adverse reactions to medications - risk of airway placement if required - damage to eyes, teeth, lips or other oral mucosa - nerve damage due to positioning  - sore throat or hoarseness - Damage to heart, brain, nerves, lungs, other parts of body or loss of life  Patient voiced understanding and assent.)       Anesthesia Quick  Evaluation

## 2023-10-22 NOTE — Interval H&P Note (Signed)
 History and Physical Interval Note:  10/22/2023 12:25 PM  Brandy [name]  has presented today for surgery, with the diagnosis of Abnormal findings in stool (R19.5).  The various methods of treatment have been discussed with the patient and family. After consideration of risks, benefits and other options for treatment, the patient has consented to  Procedure(s) with comments: COLONOSCOPY (N/A) - Diet Controlled as a surgical intervention.  The patient's history has been reviewed, patient examined, no change in status, stable for surgery.  I have reviewed the patient's chart and labs.  Questions were answered to the patient's satisfaction.     [place], Venkat Ankney

## 2023-10-22 NOTE — H&P (Signed)
 Outpatient short stay form Pre-procedure 10/22/2023 12:24 PM Keilany Burnette K. Aundria, M.D.  Primary Physician: Comer Gaskins, FNP  Reason for visit:  Positive cologuard  History of present illness:  Patient is a pleasant 58 y/o female/female presenting on referral from primary care provider for a POSITIVE Cologuard result. Patient denies change in bowel habits, rectal bleeding, weight loss or abdominal pain.       Current Facility-Administered Medications:    0.9 %  sodium chloride  infusion, , Intravenous, Continuous, [place], Parneet Glantz K, MD, Last Rate: 20 mL/hr at 10/22/23 1209, Continued from Pre-op at 10/22/23 1209  Medications Prior to Admission  Medication Sig Dispense Refill Last Dose/Taking   acetaminophen  (TYLENOL ) 650 MG CR tablet Take 1,300 mg by mouth every 8 (eight) hours as needed for pain.   Past Week   atorvastatin  (LIPITOR) 10 MG tablet TAKE 1 TABLET BY MOUTH EVERY DAY FOR CHOLESTEROL 90 tablet 2 Past Week   Blood Glucose Monitoring Suppl (ONETOUCH VERIO REFLECT) w/Device KIT 3 (three) times daily.   Past Week   cetirizine  (ZYRTEC ) 10 MG tablet TAKE 1 TABLET (10 MG TOTAL) BY MOUTH DAILY. FOR ALLERGIES 90 tablet 1 Past Week   citalopram  (CELEXA ) 40 MG tablet Take 40 mg by mouth daily.   Past Week   DULoxetine  (CYMBALTA ) 30 MG capsule Take 30 mg by mouth daily. Take with 60 mg to equal 90 mg daily   Past Week   DULoxetine  (CYMBALTA ) 60 MG capsule Take 60 mg by mouth daily. Take with 30 mg to equal 90 mg daily   Past Week   folic acid  (FOLVITE ) 1 MG tablet Take 1 mg by mouth daily.   Past Week   gabapentin  (NEURONTIN ) 300 MG capsule Take 3 capsules (900 mg total) by mouth 3 (three) times daily. For back pain 270 capsule 3 Past Week   lamoTRIgine  (LAMICTAL ) 100 MG tablet Take 100 mg by mouth every evening.   Past Week   lamoTRIgine  (LAMICTAL ) 200 MG tablet Take 200 mg by mouth in the morning.   Past Week   Multiple Minerals-Vitamins (CALCIUM -MAGNESIUM-ZINC-D3) TABS Take 2 tablets by  mouth daily.   Past Week   pantoprazole  (PROTONIX ) 40 MG tablet Take 1 tablet (40 mg total) by mouth daily. 90 tablet 0 Past Week   Blood Glucose Monitoring Suppl DEVI 1 each by Does not apply route in the morning, at noon, and at bedtime. May substitute to any manufacturer covered by patient's insurance. 1 each 0    glucose blood (ONETOUCH VERIO) test strip TEST BLOOD SUGAR MORNING, NOON AND AT BEDTIME 100 strip 3    metroNIDAZOLE (METROCREAM) 0.75 % cream Apply 1 application topically daily as needed (rosacea).      ondansetron  (ZOFRAN -ODT) 4 MG disintegrating tablet Dissolve 1 tablet (4 mg total) by mouth every 6 (six) hours as needed for nausea or vomiting. 20 tablet 0    SODIUM FLUORIDE 5000 SENSITIVE 1.1-5 % GEL See admin instructions. see package      Vitamin D , Ergocalciferol , (DRISDOL ) 1.25 MG (50000 UNIT) CAPS capsule TAKE 1 CAPSULE BY MOUTH ONCE WEEKLY FOR VITAMIN D . 12 capsule 1      Allergies  Allergen Reactions   Diphenhydramine Hcl Rash    minor rash   Penicillins Rash     Past Medical History:  Diagnosis Date   Anxiety    Arthritis    Back pain    Brain fog    CHF (congestive heart failure) (HCC)    pt not aware of  this   Depression    Diabetes mellitus without complication (HCC)    Difficulty walking    Dysmetabolic syndrome X    Fatigue    Fibromyalgia    neuropathy   Hypertension    Hypertriglyceridemia    Joint pain    Leg weakness    Lower extremity edema    Muscle atrophy    Neuropathy    Obesity    Osteoarthritis    PONV (postoperative nausea and vomiting)    Poor memory    Post-menopausal bleeding    Prediabetes    Rosacea    Sleep apnea    not on cpap yet   Tetanus vaccine causing adverse effect in therapeutic use    Vitamin D  deficiency     Review of systems:  Otherwise negative.    Physical Exam  Gen: Alert, oriented. Appears stated age.  HEENT: [place]/AT. PERRLA. Lungs: CTA, no wheezes. CV: RR nl S1, S2. Abd: soft, benign, no  masses. BS+ Ext: No edema. Pulses 2+    Planned procedures: Proceed with colonoscopy. The patient understands the nature of the planned procedure, indications, risks, alternatives and potential complications including but not limited to bleeding, infection, perforation, damage to internal organs and possible oversedation/side effects from anesthesia. The patient agrees and gives consent to proceed.  Please refer to procedure notes for findings, recommendations and patient disposition/instructions.     Dericka Ostenson K. Aundria, M.D. Gastroenterology 10/22/2023  12:24 PM

## 2023-10-22 NOTE — Anesthesia Postprocedure Evaluation (Signed)
 Anesthesia Post Note  Patient: Brandy [name]  Procedure(s) Performed: COLONOSCOPY  Patient location during evaluation: PACU Anesthesia Type: General Level of consciousness: awake and alert Pain management: pain level controlled Vital Signs Assessment: post-procedure vital signs reviewed and stable Respiratory status: spontaneous breathing, nonlabored ventilation and respiratory function stable Cardiovascular status: blood pressure returned to baseline and stable Postop Assessment: no apparent nausea or vomiting Anesthetic complications: no   No notable events documented.   Last Vitals:  Vitals:   10/22/23 1310 10/22/23 1313  BP:  136/69  Pulse: (!) 54   Resp: 18   Temp:    SpO2: 100%     Last Pain:  Vitals:   10/22/23 1313  TempSrc:   PainSc: 0-No pain                 Camellia Merilee Louder

## 2023-10-23 ENCOUNTER — Encounter: Payer: Self-pay | Admitting: Internal Medicine

## 2023-10-24 ENCOUNTER — Ambulatory Visit (INDEPENDENT_AMBULATORY_CARE_PROVIDER_SITE_OTHER): Admitting: Psychology

## 2023-10-24 DIAGNOSIS — F3189 Other bipolar disorder: Secondary | ICD-10-CM | POA: Diagnosis not present

## 2023-10-24 NOTE — Progress Notes (Signed)
 [facility] Counselor/Therapist Progress Note  Patient ID: Brandy [name], MRN: [id number]   Date: 10/24/23  Time Spent: 12:05  pm - 1:01 pm : 56 Minutes  Treatment Type: Individual Therapy.  Reported Symptoms: Bipolar, depression, and anxiety.   Mental Status Exam: Appearance:  Casual     Behavior: Appropriate and Rigid  Motor: Normal  Speech/Language:  Clear and Coherent  Affect: Congruent  Mood: anxious  Thought process: normal  Thought content:   WNL  Sensory/Perceptual disturbances:   WNL  Orientation: oriented to person, place, time/date, and situation  Attention: Good  Concentration: Good  Memory: WNL  Fund of knowledge:  Good  Insight:   Fair  Judgment:  Fair  Impulse Control: Good   Risk Assessment: Danger to Self:  No Self-injurious Behavior: No Danger to Others: No Duty to Warn:no Physical Aggression / Violence:No  Access to Firearms a concern: No  Gang Involvement:No   Subjective:   Brandy [name] participated from home, via video and consented to treatment. Therapist participated from home office. She discussed the events of the past week. She noted recently experiencing health related stressors due to a positive cologaurd test which required a colonoscopy which was negative. She noted feeling some worry and concern followed by relief with a negative screening. We worked on processing this during the session. Additional stressors includes her husband's upcoming knee replacement surgery. She noted a high level of anxiety regarding this and noted worry about symptom management during this time. She noted anticipating that her stress level will rise it gets closer to the surgery date. We worked on exploring this during the session. Therapist validated Brandy [name]'s feelings and normalized her experience. We reviewed coping during the session including relaxation, breathing exercises, mindfulness, taking breaks from stressors, normalizing her stressors,  thinking positively. She noted the possibility of calling in, should the need arise, and we reviewed this during the session. 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 She noted this is a point of frustration when others don't consider her advice, regardless of whether they take the advice. We started to explore this during the session. She noted this being a love language and noted that she does it out of love, care, and betterment of others. She noted a need to shift this behavior. She noted that this often benefits others and herself. She noted a need to control her impulse in this area.  We will continue to explore this going forward including the changes she would like to make going forward. Therapist validated Brandy [name]'s feelings and experience and provided supportive therapy. A follow-up was scheduled for continued treatment, which she benefits from.   Interventions: CBT & interpersonal.   Diagnosis:  Other bipolar disorder [facility])  Psychiatric Treatment: Yes , [name], MD.    Treatment Plan:  [name], self-aware, and motivated for change.   Support System: Family and Friend.   Client Treatment Preferences Outpatient therapy.   Client Statement of Needs Brandy [name] would like to manage symptoms day to day, manage interpersonal relationships, prioritizing self, set boundaries for self and others,  improve time-management, manage dietary intake, engage inconsistent self-care, manage emotions and frustration tolerance being more mindful, being more purposeful, more strict adherence to medication regimen.   Treatment Level Weekly  Symptoms  Anxiety: feeling anxious, difficulty managing worry, worrying too much about different things, trouble relaxing, restlessness, irritability, and feeling afraid something awful might happen.   (Status:  maintained) Depression: loss of interest, feeling down, lethargy,  poor appetite, feeling bad about self, and difficulty concentrating    (Status: maintained)  Goals:   Brandy [name] experiences symptoms of depression and anxiety.   Treatment plan signed and available on s-drive:   Yes, please see patient chart for sign off.  Brandy [name] was sent the treatment plan signature form on 09/12/23.   Target Date: 09/11/24 Frequency: Weekly  Progress: 0 Modality: individual    Therapist will provide referrals for additional resources as appropriate.  Therapist will provide psycho-education regarding Brandy [name]'s diagnosis and corresponding treatment approaches and interventions. [name], LCSW will support the patient's ability to achieve the goals identified. will employ CBT, BA, Problem-solving, Solution Focused, Mindfulness,  coping skills, & other evidenced-based practices will be used to promote progress towards healthy functioning to help manage decrease symptoms associated with her diagnosis.   Reduce overall level, frequency, and intensity of the feelings of depression, anxiety and panic evidenced by decreased overall symptoms from 6 to 7 days/week to 0 to 1 days/week per client report for at least 3 consecutive months. Verbally express understanding of the relationship between feelings of depression, anxiety and their impact on thinking patterns and behaviors. Verbalize an understanding of the role that distorted thinking plays in creating fears, excessive worry, and ruminations.    Brandy [name] participated in the creation of the treatment plan)   [name], LCSW

## 2023-10-26 ENCOUNTER — Telehealth: Admitting: Nurse Practitioner

## 2023-10-26 DIAGNOSIS — K047 Periapical abscess without sinus: Secondary | ICD-10-CM

## 2023-10-26 MED ORDER — CLINDAMYCIN HCL 300 MG PO CAPS: 300.0000 mg | ORAL_CAPSULE | Freq: Three times a day (TID) | ORAL | 0 refills | Status: AC

## 2023-10-26 NOTE — Progress Notes (Signed)
 I have spent 5 minutes in review of e-visit questionnaire, review and updating patient chart, medical decision making and response to patient.   Claiborne Rigg, NP

## 2023-10-26 NOTE — Progress Notes (Signed)

## 2023-10-31 ENCOUNTER — Ambulatory Visit: Admitting: Psychology

## 2023-11-07 ENCOUNTER — Ambulatory Visit: Admitting: Psychology

## 2023-11-07 DIAGNOSIS — F3189 Other bipolar disorder: Secondary | ICD-10-CM

## 2023-11-07 NOTE — Progress Notes (Signed)
 [facility] Counselor/Therapist Progress Note  Patient ID: CRESCENTIA BOUTWELL, MRN: [id number]   Date: 11/07/23  Time Spent: 12:09 pm - 12:57 pm :48  Treatment Type: Individual Therapy.  Reported Symptoms: Bipolar, depression, and anxiety.   Mental Status Exam: Appearance:  Casual     Behavior: Appropriate and Rigid  Motor: Normal  Speech/Language:  Clear and Coherent  Affect: Congruent  Mood: anxious  Thought process: normal  Thought content:   WNL  Sensory/Perceptual disturbances:   WNL  Orientation: oriented to person, place, time/date, and situation  Attention: Good  Concentration: Good  Memory: WNL  Fund of knowledge:  Good  Insight:   Fair  Judgment:  Fair  Impulse Control: Good   Risk Assessment: Danger to Self:  No Self-injurious Behavior: No Danger to Others: No Duty to Warn:no Physical Aggression / Violence:No  Access to Firearms a concern: No  Gang Involvement:No   Subjective:   Nakyah Erdmann Sesler participated from home, via video and consented to treatment. Therapist participated from home office. She discussed the events of the past week. She noted her husband's recent knee replacement surgery, who is currently recovering. During this recovery, Henretta noted recently being asked to take care of her friend, after surgery, and noted feeling pissed off due to a text she received from her friend about confirming plans. We worked on exploring this during the session and therapist highlighted jumping to conclusion. We processed this and therapist worked on challenging this during the session. Therapist highlighted that Shyne provided a timeline and did not meet this.  Veera noted her friend having unrealistic expectations for her care and wants her needs to be anticipated. We explored this during the session. Therapist encouraged Yamil to communicate her feelings and concerns, employ conflict resolution skills, and communicate positively and assertively.  Therapist modeled this during the session. Therapist highlighted Kambri's cognitive distortions during the session and worked on challenging this during the session. We will work on increasing mindfulness, working on identifying possible evidence for or against these distortions, and how to provide feedback to others. Morayma was engaged and motivated during the session. She expressed commitment towards goals. Therapist praised [place] for her effort and provided supportive therapy.    Interventions: CBT & interpersonal.   Diagnosis:  Other bipolar disorder [facility])  Psychiatric Treatment: Yes , [name], MD.    Treatment Plan:  Client Abilities/Strengths Yula is intelligent, self-aware, and motivated for change.   Support System: Family and Friend.   Client Treatment Preferences Outpatient therapy.   Client Statement of Needs Ileanna would like to manage symptoms day to day, manage interpersonal relationships, prioritizing self, set boundaries for self and others,  improve time-management, manage dietary intake, engage inconsistent self-care, manage emotions and frustration tolerance being more mindful, being more purposeful, more strict adherence to medication regimen.   Treatment Level Weekly  Symptoms  Anxiety: feeling anxious, difficulty managing worry, worrying too much about different things, trouble relaxing, restlessness, irritability, and feeling afraid something awful might happen.   (Status: maintained) Depression: loss of interest, feeling down, lethargy, poor appetite, feeling bad about self, and difficulty concentrating    (Status: maintained)  Goals:   Makaria experiences symptoms of depression and anxiety.   Treatment plan signed and available on s-drive:   Yes, please see patient chart for sign off.  Terrion Poblano Wenner was sent the treatment plan signature form on 09/12/23.   Target Date: 09/11/24 Frequency: Weekly  Progress: 0 Modality: individual    Therapist  will  provide referrals for additional resources as appropriate.  Therapist will provide psycho-education regarding Lilinoe's diagnosis and corresponding treatment approaches and interventions. [name], LCSW will support the patient's ability to achieve the goals identified. will employ CBT, BA, Problem-solving, Solution Focused, Mindfulness,  coping skills, & other evidenced-based practices will be used to promote progress towards healthy functioning to help manage decrease symptoms associated with her diagnosis.   Reduce overall level, frequency, and intensity of the feelings of depression, anxiety and panic evidenced by decreased overall symptoms from 6 to 7 days/week to 0 to 1 days/week per client report for at least 3 consecutive months. Verbally express understanding of the relationship between feelings of depression, anxiety and their impact on thinking patterns and behaviors. Verbalize an understanding of the role that distorted thinking plays in creating fears, excessive worry, and ruminations.    [name] participated in the creation of the treatment plan)   [name], LCSW

## 2023-11-14 ENCOUNTER — Ambulatory Visit (INDEPENDENT_AMBULATORY_CARE_PROVIDER_SITE_OTHER): Admitting: Psychology

## 2023-11-14 DIAGNOSIS — F3189 Other bipolar disorder: Secondary | ICD-10-CM | POA: Diagnosis not present

## 2023-11-14 NOTE — Progress Notes (Signed)
 [facility] Counselor/Therapist Progress Note  Patient ID: Brandy [name], MRN: [id number]   Date: 11/14/23  Time Spent: 12:09 pm - 1:02 pm :  53  minutes  Treatment Type: Individual Therapy.  Reported Symptoms: Bipolar, depression, and anxiety.   Mental Status Exam: Appearance:  Casual     Behavior: Appropriate and Rigid  Motor: Normal  Speech/Language:  Clear and Coherent  Affect: Congruent  Mood: anxious  Thought process: normal  Thought content:   WNL  Sensory/Perceptual disturbances:   WNL  Orientation: oriented to person, place, time/date, and situation  Attention: Good  Concentration: Good  Memory: WNL  Fund of knowledge:  Good  Insight:   Fair  Judgment:  Fair  Impulse Control: Good   Risk Assessment: Danger to Self:  No Self-injurious Behavior: No Danger to Others: No Duty to Warn:no Physical Aggression / Violence:No  Access to Firearms a concern: No  Gang Involvement:No   Subjective:   Brandy [name] participated from home, via video and consented to treatment. Therapist participated from home office. She discussed the events of the past week. We continued processing her recent interaction with a close friend, from our previous appointment, and her jump to conclusion about her friend having ill intent.We continued to explore this during the session. She noted that she expects her friend to be better. We worked on identifying possible evidence for and against. We worked on Brandy [name]'s attempts to communicate her feelings in the past. She noted her friend's avoidance to discuss various issues and Brandy [name] noted often feeling disregarded. We processed this during the session. She noted her need to be needed and her friend choosing her plays a part in her decision-making regarding addressing this issue. She noted an insight, that she worries that she might not be chosen if she gives feedback to others. We worked on processing this during the session.  Therapist encouraged additional introspection in this area. We will continue to process this, going forward. She noted stressors with her father as she attempts to provide support with the repairs in her father's home. She noted the frustration regarding these interactions and having to set boundaries with her father. We processed this during the session. Therapist validated Brandy [name]'s feelings and experience. Therapist provided supportive therapy.   Interventions: CBT & interpersonal.   Diagnosis:  Other bipolar disorder [facility])  Psychiatric Treatment: Yes , Brandy Drone, MD.    Treatment Plan:  [name], self-aware, and motivated for change.   Support System: Family and Friend.   Client Treatment Preferences Outpatient therapy.   Client Statement of Needs Brandy [name] would like to manage symptoms day to day, manage interpersonal relationships, prioritizing self, set boundaries for self and others,  improve time-management, manage dietary intake, engage inconsistent self-care, manage emotions and frustration tolerance being more mindful, being more purposeful, more strict adherence to medication regimen.   Treatment Level Weekly  Symptoms  Anxiety: feeling anxious, difficulty managing worry, worrying too much about different things, trouble relaxing, restlessness, irritability, and feeling afraid something awful might happen.   (Status: maintained) Depression: loss of interest, feeling down, lethargy, poor appetite, feeling bad about self, and difficulty concentrating    (Status: maintained)  Goals:   Brandy [name] experiences symptoms of depression and anxiety.   Treatment plan signed and available on s-drive:   Yes, please see patient chart for sign off.  Brandy [name] was sent the treatment plan signature form on 09/12/23.   Target Date: 09/11/24 Frequency: Weekly  Progress: 0 Modality: individual    Therapist will provide referrals for additional  resources as appropriate.  Therapist will provide psycho-education regarding Brandy [name]'s diagnosis and corresponding treatment approaches and interventions. Brandy Mullet, LCSW will support the patient's ability to achieve the goals identified. will employ CBT, BA, Problem-solving, Solution Focused, Mindfulness,  coping skills, & other evidenced-based practices will be used to promote progress towards healthy functioning to help manage decrease symptoms associated with her diagnosis.   Reduce overall level, frequency, and intensity of the feelings of depression, anxiety and panic evidenced by decreased overall symptoms from 6 to 7 days/week to 0 to 1 days/week per client report for at least 3 consecutive months. Verbally express understanding of the relationship between feelings of depression, anxiety and their impact on thinking patterns and behaviors. Verbalize an understanding of the role that distorted thinking plays in creating fears, excessive worry, and ruminations.    [name] participated in the creation of the treatment plan)   Brandy Mullet, LCSW

## 2023-11-21 ENCOUNTER — Ambulatory Visit: Admitting: Psychology

## 2023-11-21 DIAGNOSIS — N3001 Acute cystitis with hematuria: Secondary | ICD-10-CM

## 2023-11-21 DIAGNOSIS — F3189 Other bipolar disorder: Secondary | ICD-10-CM | POA: Diagnosis not present

## 2023-11-21 NOTE — Progress Notes (Unsigned)
 [facility] Counselor/Therapist Progress Note  Patient ID: Brandy [name], MRN: [id number]   Date: 11/21/23  Time Spent: 12:10 pm - 1:03  pm :  53  minutes  Treatment Type: Individual Therapy.  Reported Symptoms: Bipolar, depression, and anxiety.   Mental Status Exam: Appearance:  Casual     Behavior: Appropriate and Rigid  Motor: Normal  Speech/Language:  Clear and Coherent  Affect: Congruent  Mood: anxious  Thought process: normal  Thought content:   WNL  Sensory/Perceptual disturbances:   WNL  Orientation: oriented to person, place, time/date, and situation  Attention: Good  Concentration: Good  Memory: WNL  Fund of knowledge:  Good  Insight:   Fair  Judgment:  Fair  Impulse Control: Good   Risk Assessment: Danger to Self:  No Self-injurious Behavior: No Danger to Others: No Duty to Warn:no Physical Aggression / Violence:No  Access to Firearms a concern: No  Gang Involvement:No   Subjective:   Brandy [name] participated from home, via video and consented to treatment. Therapist participated from home office. She discussed the events of the past week. She noted a recent disagreement that spurred on feeling antagonized and responding in kind. She noted feeling frustration by his tone and noted that causing her frustration and noted becoming angry and verbally lashing out. She noted generally feeling disconnected from him and noted that she barely gets anything out of the relationship. She noted her husband's frustration that she spends ~3.5 hours at the gym and noted that she does the bulk of the housework. She noted that he is poo pooing on my parade. She noted her concerted and consistent effort to improve the relationship and noted that emotionally I am just not getting anything. We worked on processing this event and worked on Automotive engineer . Therapist reviewed fair fighting rules.      We continued processing her recent interaction with a  close friend, from our previous appointment, and her jump to conclusion about her friend having ill intent.We continued to explore this during the session. She noted that she expects her friend to be better. We worked on identifying possible evidence for and against. We worked on Shanitha's attempts to communicate her feelings in the past. She noted her friend's avoidance to discuss various issues and Delecia noted often feeling disregarded. We processed this during the session. She noted her need to be needed and her friend choosing her plays a part in her decision-making regarding addressing this issue. She noted an insight, that she worries that she might not be chosen if she gives feedback to others. We worked on processing this during the session. Therapist encouraged additional introspection in this area. We will continue to process this, going forward. She noted stressors with her father as she attempts to provide support with the repairs in her father's home. She noted the frustration regarding these interactions and having to set boundaries with her father. We processed this during the session. Therapist validated Brandy [name]'s feelings and experience. Therapist provided supportive therapy.   Interventions: CBT & interpersonal.   Diagnosis:  Other bipolar disorder [facility])  Psychiatric Treatment: Yes , [name], MD.    Treatment Plan:  Client Abilities/Strengths Oda is intelligent, self-aware, and motivated for change.   Support System: Family and Friend.   Client Treatment Preferences Outpatient therapy.   Client Statement of Needs Brandy [name] would like to manage symptoms day to day, manage interpersonal relationships, prioritizing self, set boundaries for self and others,  improve time-management, manage  dietary intake, engage inconsistent self-care, manage emotions and frustration tolerance being more mindful, being more purposeful, more strict adherence to medication regimen.   Treatment  Level Weekly  Symptoms  Anxiety: feeling anxious, difficulty managing worry, worrying too much about different things, trouble relaxing, restlessness, irritability, and feeling afraid something awful might happen.   (Status: maintained) Depression: loss of interest, feeling down, lethargy, poor appetite, feeling bad about self, and difficulty concentrating    (Status: maintained)  Goals:   Brandy [name] experiences symptoms of depression and anxiety.   Treatment plan signed and available on s-drive:   Yes, please see patient chart for sign off.  Jamia Hoban Gabrielson was sent the treatment plan signature form on 09/12/23.   Target Date: 09/11/24 Frequency: Weekly  Progress: 0 Modality: individual    Therapist will provide referrals for additional resources as appropriate.  Therapist will provide psycho-education regarding Brandy [name]'s diagnosis and corresponding treatment approaches and interventions. [name], LCSW will support the patient's ability to achieve the goals identified. will employ CBT, BA, Problem-solving, Solution Focused, Mindfulness,  coping skills, & other evidenced-based practices will be used to promote progress towards healthy functioning to help manage decrease symptoms associated with her diagnosis.   Reduce overall level, frequency, and intensity of the feelings of depression, anxiety and panic evidenced by decreased overall symptoms from 6 to 7 days/week to 0 to 1 days/week per client report for at least 3 consecutive months. Verbally express understanding of the relationship between feelings of depression, anxiety and their impact on thinking patterns and behaviors. Verbalize an understanding of the role that distorted thinking plays in creating fears, excessive worry, and ruminations.    [name] participated in the creation of the treatment plan)   [name], LCSW

## 2023-11-22 ENCOUNTER — Encounter: Payer: Self-pay | Admitting: Primary Care

## 2023-11-22 ENCOUNTER — Ambulatory Visit (INDEPENDENT_AMBULATORY_CARE_PROVIDER_SITE_OTHER): Admitting: Primary Care

## 2023-11-22 VITALS — BP 128/62 | HR 61 | Temp 97.2°F | Ht 66.0 in | Wt 263.0 lb

## 2023-11-22 DIAGNOSIS — R350 Frequency of micturition: Secondary | ICD-10-CM | POA: Insufficient documentation

## 2023-11-22 LAB — POC URINALSYSI DIPSTICK (AUTOMATED)
Bilirubin, UA: NEGATIVE
Blood, UA: POSITIVE
Glucose, UA: NEGATIVE
Ketones, UA: NEGATIVE
Nitrite, UA: NEGATIVE
Protein, UA: NEGATIVE
Spec Grav, UA: 1.01 (ref 1.010–1.025)
Urobilinogen, UA: 0.2 U/dL
pH, UA: 6 (ref 5.0–8.0)

## 2023-11-22 MED ORDER — SULFAMETHOXAZOLE-TRIMETHOPRIM 800-160 MG PO TABS: 1.0000 | ORAL_TABLET | Freq: Two times a day (BID) | ORAL | 0 refills | Status: DC

## 2023-11-22 NOTE — Progress Notes (Signed)
 Subjective:    Patient ID: Brandy [name], female    DOB: 06-28-1965, 58 y.o.   MRN: [id number]  Urinary Frequency  Associated symptoms include frequency. Pertinent negatives include no flank pain or hematuria.    Brandy [name] is a very pleasant 58 y.o. female with a history of gastric bypass, type 2 diabetes, urinary incontinence, postmenopausal bleeding who presents today to discuss urinary frequency.  Symptom onset 3 to 4 days ago with increased frequency of urination, urinary urgency, pressure at vaginal opening, vaginal itching, bilateral lower back pain, pelvic pressure.   She denies hematuria, fevers, flank pain. She is staying hydrated throughout the day. She began exercising at the gym as of 1 week ago. She will exercise for about 3 hours at a time.   Wt Readings from Last 3 Encounters:  11/22/23 263 lb (119.3 kg)  10/22/23 256 lb 11.2 oz (116.4 kg)  07/19/23 260 lb (117.9 kg)   BP Readings from Last 3 Encounters:  11/22/23 128/62  10/22/23 136/69  07/19/23 104/68        Review of Systems  Constitutional:  Negative for fever.  Gastrointestinal:  Negative for abdominal pain.  Genitourinary:  Positive for frequency and pelvic pain. Negative for dysuria, flank pain, hematuria, vaginal bleeding and vaginal discharge.         Past Medical History:  Diagnosis Date   Anxiety    Arthritis    Back pain    Brain fog    CHF (congestive heart failure) (HCC)    pt not aware of this   Depression    Diabetes mellitus without complication (HCC)    Difficulty walking    Dysmetabolic syndrome X    Fatigue    Fibromyalgia    neuropathy   Hypertension    Hypertriglyceridemia    Joint pain    Leg weakness    Lower extremity edema    Muscle atrophy    Neuropathy    Obesity    Osteoarthritis    PONV (postoperative nausea and vomiting)    Poor memory    Post-menopausal bleeding    Prediabetes    Rosacea    Sleep apnea    not on cpap yet   Tetanus vaccine  causing adverse effect in therapeutic use    Vitamin D  deficiency     Social History   Socioeconomic History   Marital status: Married    Spouse name: Ed   Number of children: 3   Years of education: Not on file   Highest education level: Associate degree: academic program  Occupational History   Occupation: Immunologist: [employer]  Tobacco Use   Smoking status: Never   Smokeless tobacco: Never  Vaping Use   Vaping status: Never Used  Substance and Sexual Activity   Alcohol use: Not Currently    Comment: rarely - once a year   Drug use: No   Sexual activity: Not Currently    Birth control/protection: Post-menopausal  Other Topics Concern   Not on file  Social History Narrative   Best boy      Married; 3 kids (middle child-severely autistic)      No regular exercise   Social Drivers of Health   Financial Resource Strain: High Risk (06/12/2023)   Overall Financial Resource Strain (CARDIA)    Difficulty of Paying Living Expenses: Hard  Food Insecurity: No Food Insecurity (06/12/2023)   Hunger Vital Sign    Worried  About Running Out of Food in the Last Year: Never true    Ran Out of Food in the Last Year: Never true  Recent Concern: Food Insecurity - Food Insecurity Present (06/07/2023)   Hunger Vital Sign    Worried About Running Out of Food in the Last Year: Sometimes true    Ran Out of Food in the Last Year: Sometimes true  Transportation Needs: No Transportation Needs (06/12/2023)   PRAPARE - Administrator, Civil Service (Medical): No    Lack of Transportation (Non-Medical): No  Physical Activity: Insufficiently Active (06/12/2023)   Exercise Vital Sign    Days of Exercise per Week: 2 days    Minutes of Exercise per Session: 30 min  Stress: Stress Concern Present (06/12/2023)   [employer] of Occupational Health - Occupational Stress Questionnaire    Feeling of Stress : Very much  Social  Connections: Socially Integrated (06/12/2023)   Social Connection and Isolation Panel    Frequency of Communication with Friends and Family: More than three times a week    Frequency of Social Gatherings with Friends and Family: Never    Attends Religious Services: More than 4 times per year    Active Member of Clubs or Organizations: No    Attends Engineer, structural: More than 4 times per year    Marital Status: Married  Catering manager Violence: Not At Risk (06/07/2023)   Humiliation, Afraid, Rape, and Kick questionnaire    Fear of Current or Ex-Partner: No    Emotionally Abused: No    Physically Abused: No    Sexually Abused: No    Past Surgical History:  Procedure Laterality Date   BONE TUMOR EXCISION  1975   Right leg   COLONOSCOPY N/A 10/22/2023   Procedure: COLONOSCOPY;  Surgeon: Toledo, Ladell POUR, MD;  Location: [facility];  Service: Gastroenterology;  Laterality: N/A;  Diet Controlled   DILATATION & CURETTAGE/HYSTEROSCOPY WITH MYOSURE N/A 10/07/2019   Procedure: DILATATION AND CURETTAGE /HYSTEROSCOPY, Myosure, Polypectomy;  Surgeon: Izell Harari, MD;  Location: [facility] OR;  Service: Gynecology;  Laterality: N/A;   ENDOMETRIAL BIOPSY  07/14/2019       epidural steroid injection     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   Procedure: LAPAROSCOPIC ROUX-EN-Y GASTRIC BYPASS WITH UPPER ENDOSCOPY;  Surgeon: Stevie, Herlene Righter, MD;  Location: [facility];  Service: General;  Laterality: N/A;    Family History  Problem Relation Age of Onset   Rheum arthritis Mother    COPD Mother    Stroke Mother    Depression Mother    Anxiety disorder Mother    Eating disorder Mother    Obesity Mother    Hypertension Father    Depression Father    Alcoholism Father    Heart disease Maternal Grandmother    Stroke Maternal Grandmother    Alcohol abuse Maternal Grandmother    Alcohol abuse Maternal Grandfather    Aneurysm Paternal Grandmother    Autism Son        severe   Diabetes  Paternal Aunt    Breast cancer Neg Hx     Allergies  Allergen Reactions   Diphenhydramine Hcl Rash    minor rash   Penicillins Rash    Current Outpatient Medications on File Prior to Visit  Medication Sig Dispense Refill   acetaminophen  (TYLENOL ) 650 MG CR tablet Take 1,300 mg by mouth every 8 (eight) hours as needed for pain.     atorvastatin  (LIPITOR) 10 MG tablet  TAKE 1 TABLET BY MOUTH EVERY DAY FOR CHOLESTEROL 90 tablet 2   Blood Glucose Monitoring Suppl (ONETOUCH VERIO REFLECT) w/Device KIT 3 (three) times daily.     Blood Glucose Monitoring Suppl DEVI 1 each by Does not apply route in the morning, at noon, and at bedtime. May substitute to any manufacturer covered by patient's insurance. 1 each 0   cetirizine  (ZYRTEC ) 10 MG tablet TAKE 1 TABLET (10 MG TOTAL) BY MOUTH DAILY. FOR ALLERGIES 90 tablet 1   citalopram  (CELEXA ) 40 MG tablet Take 40 mg by mouth daily.     DULoxetine  (CYMBALTA ) 30 MG capsule Take 30 mg by mouth daily. Take with 60 mg to equal 90 mg daily     DULoxetine  (CYMBALTA ) 60 MG capsule Take 60 mg by mouth daily. Take with 30 mg to equal 90 mg daily     folic acid  (FOLVITE ) 1 MG tablet Take 1 mg by mouth daily.     gabapentin  (NEURONTIN ) 300 MG capsule Take 3 capsules (900 mg total) by mouth 3 (three) times daily. For back pain 270 capsule 3   glucose blood (ONETOUCH VERIO) test strip TEST BLOOD SUGAR MORNING, NOON AND AT BEDTIME 100 strip 3   lamoTRIgine  (LAMICTAL ) 100 MG tablet Take 100 mg by mouth every evening.     lamoTRIgine  (LAMICTAL ) 200 MG tablet Take 200 mg by mouth in the morning.     Multiple Minerals-Vitamins (CALCIUM -MAGNESIUM-ZINC-D3) TABS Take 2 tablets by mouth daily.     ondansetron  (ZOFRAN -ODT) 4 MG disintegrating tablet Dissolve 1 tablet (4 mg total) by mouth every 6 (six) hours as needed for nausea or vomiting. 20 tablet 0   pantoprazole  (PROTONIX ) 40 MG tablet Take 1 tablet (40 mg total) by mouth daily. 90 tablet 0   SODIUM FLUORIDE 5000  SENSITIVE 1.1-5 % GEL See admin instructions. see package     Vitamin D , Ergocalciferol , (DRISDOL ) 1.25 MG (50000 UNIT) CAPS capsule TAKE 1 CAPSULE BY MOUTH ONCE WEEKLY FOR VITAMIN D . 12 capsule 1   metroNIDAZOLE (METROCREAM) 0.75 % cream Apply 1 application topically daily as needed (rosacea). (Patient not taking: Reported on 11/22/2023)     No current facility-administered medications on file prior to visit.    BP 128/62   Pulse 61   Temp (!) 97.2 F (36.2 C) (Temporal)   Ht 5' 6 (1.676 m)   Wt 263 lb (119.3 kg)   LMP  (LMP Unknown)   SpO2 99%   BMI 42.45 kg/m  Objective:   Physical Exam Cardiovascular:     Rate and Rhythm: Normal rate.  Pulmonary:     Effort: Pulmonary effort is normal.  Abdominal:     Tenderness: There is abdominal tenderness in the suprapubic area. There is no right CVA tenderness or left CVA tenderness.  Neurological:     Mental Status: She is alert.           Assessment & Plan:  Urinary frequency Assessment & Plan: UA today with 2+ leuks, 2+ blood. Urine culture ordered and pending.  Given symptoms, coupled with urinalysis result, will treat for presumed infection.  Start Bactrim  DS (sulfamethoxazole /trimethoprim ) tablets for urinary tract infection. Take 1 tablet by mouth twice daily for 3 days.  Follow-up as needed.  Orders: -     POCT Urinalysis Dipstick (Automated) -     Sulfamethoxazole -Trimethoprim ; Take 1 tablet by mouth 2 (two) times daily. For urinary tract infection.  Dispense: 6 tablet; Refill: 0 -     Urine Culture  Dore Oquin K Yarissa Reining, NP

## 2023-11-22 NOTE — Assessment & Plan Note (Signed)
 UA today with 2+ leuks, 2+ blood. Urine culture ordered and pending.  Given symptoms, coupled with urinalysis result, will treat for presumed infection.  Start Bactrim  DS (sulfamethoxazole /trimethoprim ) tablets for urinary tract infection. Take 1 tablet by mouth twice daily for 3 days.  Follow-up as needed.

## 2023-11-22 NOTE — Patient Instructions (Signed)
 Start Bactrim  DS (sulfamethoxazole /trimethoprim ) tablets for urinary tract infection. Take 1 tablet by mouth twice daily for 3 days.  It was a pleasure to see you today!

## 2023-11-25 ENCOUNTER — Ambulatory Visit: Payer: Self-pay | Admitting: Primary Care

## 2023-11-26 LAB — URINE CULTURE
MICRO NUMBER:: 16746875
SPECIMEN QUALITY:: ADEQUATE

## 2023-11-26 MED ORDER — SULFAMETHOXAZOLE-TRIMETHOPRIM 800-160 MG PO TABS: 1.0000 | ORAL_TABLET | Freq: Two times a day (BID) | ORAL | 0 refills | Status: AC

## 2023-11-28 ENCOUNTER — Ambulatory Visit: Admitting: Primary Care

## 2023-11-28 ENCOUNTER — Ambulatory Visit (INDEPENDENT_AMBULATORY_CARE_PROVIDER_SITE_OTHER): Admitting: Psychology

## 2023-11-28 DIAGNOSIS — F3189 Other bipolar disorder: Secondary | ICD-10-CM

## 2023-11-28 NOTE — Progress Notes (Signed)
 [facility] Counselor/Therapist Progress Note  Patient ID: Brandy [name], MRN: [id number]   Date: 11/28/23  Time Spent: 12:06 pm - 1:01 pm :  55 minutes  Treatment Type: Individual Therapy.  Reported Symptoms: Bipolar, depression, and anxiety.   Mental Status Exam: Appearance:  Casual     Behavior: Appropriate and Rigid  Motor: Normal  Speech/Language:  Clear and Coherent  Affect: Congruent  Mood: dysthymic  Thought process: normal  Thought content:   WNL  Sensory/Perceptual disturbances:   WNL  Orientation: oriented to person, place, time/date, and situation  Attention: Good  Concentration: Good  Memory: WNL  Fund of knowledge:  Good  Insight:   Fair  Judgment:  Fair  Impulse Control: Good   Risk Assessment: Danger to Self:  No Self-injurious Behavior: No Danger to Others: No Duty to Warn:no Physical Aggression / Violence:No  Access to Firearms a concern: No  Gang Involvement:No   Subjective:   [name] participated from home, via video and consented to treatment. Therapist participated from home office. Sanye provided feedback regarding her past week. She noted providing feedback to her husband regarding needing time alone process their recent disagreement. She recalled her approach, with her husband, and we reflected on her approach. She noted that her approach could have been better but that it's difficult to response purposefully when triggered. She noted, when she manages it well, she can walk away. Conversely, at times, she might respond in kind, when upset. We worked on differentiating between the application of these two approaches. She noted when the response is unexpected or if she is caught off guard or her feelings are hurt. We worked on exploring the pros and cons of this approach on her, others, relationships as a whole. We will continue this going forward. Therapist provided [name] with a handout for REST exercise to be reviewed  prior to the follow-up. We will review this during our follow-up. She noted continued stressors with her friend and provided background regarding a recent disagreement. We processed this during the session. Therapist highlighted poor communication, jumps to conclusions, and unaddressed feelings and stressors. She noted an upcoming trip to visit this friend but noted that it's unlikely to go poorly due to past experience.  Therapist encouraged [name] to review the REST exercise between sessions. Therapist validated [name]'s feelings and experience, encouraged that she employs positive communication, and provided supportive therapy.    Interventions: CBT & interpersonal.   Diagnosis:  Other bipolar disorder [facility])  Psychiatric Treatment: Yes , [name], MD.    Treatment Plan:  Client Abilities/Strengths Pharrah is intelligent, self-aware, and motivated for change.   Support System: Family and Friend.   Client Treatment Preferences Outpatient therapy.   Client Statement of Needs [name] would like to manage symptoms day to day, manage interpersonal relationships, prioritizing self, set boundaries for self and others,  improve time-management, manage dietary intake, engage inconsistent self-care, manage emotions and frustration tolerance being more mindful, being more purposeful, more strict adherence to medication regimen.   Treatment Level Weekly  Symptoms  Anxiety: feeling anxious, difficulty managing worry, worrying too much about different things, trouble relaxing, restlessness, irritability, and feeling afraid something awful might happen.   (Status: maintained) Depression: loss of interest, feeling down, lethargy, poor appetite, feeling bad about self, and difficulty concentrating    (Status: maintained)  Goals:   Kevonna experiences symptoms of depression and anxiety.   Treatment plan signed and available on s-drive:   Yes, please see patient  chart for sign off.  [name] was  sent the treatment plan signature form on 09/12/23.   Target Date: 09/11/24 Frequency: Weekly  Progress: 0 Modality: individual    Therapist will provide referrals for additional resources as appropriate.  Therapist will provide psycho-education regarding [name]'s diagnosis and corresponding treatment approaches and interventions. [name], LCSW will support the patient's ability to achieve the goals identified. will employ CBT, BA, Problem-solving, Solution Focused, Mindfulness,  coping skills, & other evidenced-based practices will be used to promote progress towards healthy functioning to help manage decrease symptoms associated with her diagnosis.   Reduce overall level, frequency, and intensity of the feelings of depression, anxiety and panic evidenced by decreased overall symptoms from 6 to 7 days/week to 0 to 1 days/week per client report for at least 3 consecutive months. Verbally express understanding of the relationship between feelings of depression, anxiety and their impact on thinking patterns and behaviors. Verbalize an understanding of the role that distorted thinking plays in creating fears, excessive worry, and ruminations.    [name] participated in the creation of the treatment plan)   [name], LCSW

## 2023-12-05 ENCOUNTER — Ambulatory Visit (INDEPENDENT_AMBULATORY_CARE_PROVIDER_SITE_OTHER): Admitting: Psychology

## 2023-12-05 DIAGNOSIS — F3189 Other bipolar disorder: Secondary | ICD-10-CM | POA: Diagnosis not present
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
## 2023-12-12 ENCOUNTER — Ambulatory Visit (INDEPENDENT_AMBULATORY_CARE_PROVIDER_SITE_OTHER): Admitting: Psychology

## 2023-12-12 DIAGNOSIS — F3189 Other bipolar disorder: Secondary | ICD-10-CM | POA: Diagnosis not present
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
## 2023-12-26 ENCOUNTER — Ambulatory Visit (INDEPENDENT_AMBULATORY_CARE_PROVIDER_SITE_OTHER): Admitting: Psychology

## 2023-12-26 DIAGNOSIS — F3189 Other bipolar disorder: Secondary | ICD-10-CM

## 2023-12-26 NOTE — Progress Notes (Signed)
 [facility] Counselor/Therapist Progress Note  Patient ID: KIRSTY MONJARAZ, MRN: [id number]   Date: 12/26/23  Time Spent: 12:06 pm - 12:50 pm :  43 minutes  Treatment Type: Individual Therapy.  Reported Symptoms: Bipolar, depression, and anxiety.   Mental Status Exam: Appearance:  Casual     Behavior: Appropriate and Rigid  Motor: Normal  Speech/Language:  Clear and Coherent  Affect: Congruent  Mood: dysthymic  Thought process: normal  Thought content:   WNL  Sensory/Perceptual disturbances:   WNL  Orientation: oriented to person, place, time/date, and situation  Attention: Good  Concentration: Good  Memory: WNL  Fund of knowledge:  Good  Insight:   Fair  Judgment:  Fair  Impulse Control: Good   Risk Assessment: Danger to Self:  No Self-injurious Behavior: No Danger to Others: No Duty to Warn:no Physical Aggression / Violence:No  Access to Firearms a concern: No  Gang Involvement:No   Subjective:   Lajoya Dombek Olivier participated from home, via video and consented to treatment. Therapist participated from home office. Shawnice provided feedback regarding her past week. She noted some positives including her husband's improving health after surgery. She noted some interpersonal stressors, with her daughter, due to a lack of communication by her daughter. We worked on processing this during the session. Khalidah noted taking time to process how she would like to respond and responding positively and assertively. She noted disengaging after that point. She noted her family's current financial struggles and how this is affecting her and the family as a whole. We worked on processing this during the session. She noted recently having to go to the food pantry and noted this resulting in her crying. She noted her efforts to resolve her financial issues via budgeting but noted continued financial pressures. We worked on processing her feelings and experience during the session.  Therapist validated Kaleen's feelings and experience during the session. We worked on identifying possible resources to aid in addressing her current stressors. Brody noted recently attending a free yoga class and noted enjoying this experience. Therapist praised Cherre for her effort engage in self-care and engage. Therapist encouraged continued self-care, validated Denver's feelings and experience, and provided supportive therapy. A follow-up was scheduled for continued treatment which Deziyah benefits from.   Interventions: CBT & interpersonal.   Diagnosis:  Other bipolar disorder [facility])  Psychiatric Treatment: Yes , [name], MD.    Treatment Plan:  Client Abilities/Strengths Jamisen is intelligent, self-aware, and motivated for change.   Support System: Family and Friend.   Client Treatment Preferences Outpatient therapy.   Client Statement of Needs Mirjana would like to manage symptoms day to day, manage interpersonal relationships, prioritizing self, set boundaries for self and others,  improve time-management, manage dietary intake, engage inconsistent self-care, manage emotions and frustration tolerance being more mindful, being more purposeful, more strict adherence to medication regimen.   Treatment Level Weekly  Symptoms  Anxiety: feeling anxious, difficulty managing worry, worrying too much about different things, trouble relaxing, restlessness, irritability, and feeling afraid something awful might happen.   (Status: maintained) Depression: loss of interest, feeling down, lethargy, poor appetite, feeling bad about self, and difficulty concentrating    (Status: maintained)  Goals:   Kassey experiences symptoms of depression and anxiety.   Treatment plan signed and available on s-drive:   Yes, please see patient chart for sign off.  Kathren Scearce Hartsell was sent the treatment plan signature form on 09/12/23.   Target Date: 09/11/24 Frequency: Weekly  Progress: 0 Modality: individual     Therapist will provide referrals for additional resources as appropriate.  Therapist will provide psycho-education regarding Illianna's diagnosis and corresponding treatment approaches and interventions. [name], LCSW will support the patient's ability to achieve the goals identified. will employ CBT, BA, Problem-solving, Solution Focused, Mindfulness,  coping skills, & other evidenced-based practices will be used to promote progress towards healthy functioning to help manage decrease symptoms associated with her diagnosis.   Reduce overall level, frequency, and intensity of the feelings of depression, anxiety and panic evidenced by decreased overall symptoms from 6 to 7 days/week to 0 to 1 days/week per client report for at least 3 consecutive months. Verbally express understanding of the relationship between feelings of depression, anxiety and their impact on thinking patterns and behaviors. Verbalize an understanding of the role that distorted thinking plays in creating fears, excessive worry, and ruminations.    [name] participated in the creation of the treatment plan)   [name], LCSW

## 2024-01-02 ENCOUNTER — Ambulatory Visit: Admitting: Psychology

## 2024-01-02 DIAGNOSIS — F3189 Other bipolar disorder: Secondary | ICD-10-CM | POA: Diagnosis not present
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
## 2024-01-08 DIAGNOSIS — F411 Generalized anxiety disorder: Secondary | ICD-10-CM | POA: Diagnosis not present

## 2024-01-08 DIAGNOSIS — R41840 Attention and concentration deficit: Secondary | ICD-10-CM | POA: Diagnosis not present

## 2024-01-08 DIAGNOSIS — F3181 Bipolar II disorder: Secondary | ICD-10-CM | POA: Diagnosis not present

## 2024-01-08 DIAGNOSIS — F5105 Insomnia due to other mental disorder: Secondary | ICD-10-CM | POA: Diagnosis not present

## 2024-01-09 ENCOUNTER — Ambulatory Visit (INDEPENDENT_AMBULATORY_CARE_PROVIDER_SITE_OTHER): Admitting: Psychology

## 2024-01-09 DIAGNOSIS — F3189 Other bipolar disorder: Secondary | ICD-10-CM | POA: Diagnosis not present
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
## 2024-01-16 ENCOUNTER — Ambulatory Visit: Admitting: Psychology

## 2024-01-16 DIAGNOSIS — F3189 Other bipolar disorder: Secondary | ICD-10-CM | POA: Diagnosis not present

## 2024-01-16 NOTE — Progress Notes (Signed)
 [facility] Counselor/Therapist Progress Note  Patient ID: Brandy [name], MRN: [id number]   Date: 01/16/24  Time Spent: 12:05 pm - 12:46  pm :  41 minutes  Treatment Type: Individual Therapy.  Reported Symptoms: Bipolar, depression, and anxiety.   Mental Status Exam: Appearance:  Casual     Behavior: Appropriate and Rigid  Motor: Normal  Speech/Language:  Clear and Coherent  Affect: Congruent  Mood: normal  Thought process: normal  Thought content:   WNL  Sensory/Perceptual disturbances:   WNL  Orientation: oriented to person, place, time/date, and situation  Attention: Good  Concentration: Good  Memory: WNL  Fund of knowledge:  Good  Insight:   Fair  Judgment:  Fair  Impulse Control: Good   Risk Assessment: Danger to Self:  No Self-injurious Behavior: No Danger to Others: No Duty to Warn:no Physical Aggression / Violence:No  Access to Firearms a concern: No  Gang Involvement:No   Subjective:   Brandy [name] participated from home, via video and consented to treatment. Therapist participated from home office. Brandy [name] provided feedback regarding her past week. She noted her upcoming travel and noted her plans. She noted her travel to help a friend and noted, in the past, experiencing stress during similar trips. We worked on exploring this during the session and Brandy [name] noted reviewing the fair fighting rules which was provided previously. She provided past examples of these stressors. We worked on rehearsing ways to manage said stressors using the aforementioned tools. She noted highlighting her boundaries during the session and noted having to employ these in conversations related to this upcoming trip.  She noted some commonalities and differences between her and friend. She noted highlighting a specific non-negotiable boundary regarding discussing politics during her trip. She noted her friend being receptive to this. She was able to highlight the positives  in the relationship and trip, as a whole. Therapist praised Brandy [name]'s effort to identify positives and be accepting of people, as a whole. Therapist validated Brandy [name]'s feelings and experience during the session. She noted a sense of excitement towards a recent offering of a free college tuition and noted her interest in researching the possibility of her being eligible. Therapist validated and acknowledged Brandy [name]'s feelings and experience and provided supportive therapy. Brandy [name] has a follow-up scheduled for continued treatment, which she benefits from.   Interventions: CBT & interpersonal.   Diagnosis:  Other bipolar disorder [facility])  Psychiatric Treatment: Yes , Brandy Drone, MD.    Treatment Plan:  [name], self-aware, and motivated for change.   Support System: Family and Friend.   Client Treatment Preferences Outpatient therapy.   Client Statement of Needs Brandy [name] would like to manage symptoms day to day, manage interpersonal relationships, prioritizing self, set boundaries for self and others,  improve time-management, manage dietary intake, engage inconsistent self-care, manage emotions and frustration tolerance being more mindful, being more purposeful, more strict adherence to medication regimen.   Treatment Level Weekly  Symptoms  Anxiety: feeling anxious, difficulty managing worry, worrying too much about different things, trouble relaxing, restlessness, irritability, and feeling afraid something awful might happen.   (Status: maintained) Depression: loss of interest, feeling down, lethargy, poor appetite, feeling bad about self, and difficulty concentrating    (Status: maintained)  Goals:   Brandy [name] experiences symptoms of depression and anxiety.   Treatment plan signed and available on s-drive:   Yes, please see patient chart for sign off.  Brandy [name] was sent the treatment plan signature  form on 09/12/23.   Target Date: 09/11/24 Frequency:  Weekly  Progress: 0 Modality: individual    Therapist will provide referrals for additional resources as appropriate.  Therapist will provide psycho-education regarding Brandy [name]'s diagnosis and corresponding treatment approaches and interventions. Brandy Mullet, LCSW will support the patient's ability to achieve the goals identified. will employ CBT, BA, Problem-solving, Solution Focused, Mindfulness,  coping skills, & other evidenced-based practices will be used to promote progress towards healthy functioning to help manage decrease symptoms associated with her diagnosis.   Reduce overall level, frequency, and intensity of the feelings of depression, anxiety and panic evidenced by decreased overall symptoms from 6 to 7 days/week to 0 to 1 days/week per client report for at least 3 consecutive months. Verbally express understanding of the relationship between feelings of depression, anxiety and their impact on thinking patterns and behaviors. Verbalize an understanding of the role that distorted thinking plays in creating fears, excessive worry, and ruminations.    Brandy [name] participated in the creation of the treatment plan)   Brandy Mullet, LCSW

## 2024-01-23 ENCOUNTER — Ambulatory Visit: Admitting: Psychology

## 2024-01-30 ENCOUNTER — Ambulatory Visit: Admitting: Psychology

## 2024-01-30 DIAGNOSIS — F3189 Other bipolar disorder: Secondary | ICD-10-CM

## 2024-01-30 NOTE — Progress Notes (Unsigned)
 [facility] Counselor/Therapist Progress Note  Patient ID: Brandy [name], MRN: [id number]   Date: 01/30/24  Time Spent: 12:07 pm - 1:00 pm :  53 minutes  Treatment Type: Individual Therapy.  Reported Symptoms: Bipolar, depression, and anxiety.   Mental Status Exam: Appearance:  Casual     Behavior: Appropriate and Rigid  Motor: Normal  Speech/Language:  Clear and Coherent  Affect: Congruent  Mood: normal  Thought process: normal  Thought content:   WNL  Sensory/Perceptual disturbances:   WNL  Orientation: oriented to person, place, time/date, and situation  Attention: Good  Concentration: Good  Memory: WNL  Fund of knowledge:  Good  Insight:   Fair  Judgment:  Fair  Impulse Control: Good   Risk Assessment: Danger to Self:  No Self-injurious Behavior: No Danger to Others: No Duty to Warn:no Physical Aggression / Violence:No  Access to Firearms a concern: No  Gang Involvement:No   Subjective:   Brandy [name] participated from home, via video and consented to treatment. Therapist participated from home office. Praise provided feedback regarding her past week. Brandy [name] reflected on her trip to visit her friend and noted it generally going well but noted having two distinct arguments during her trip. She noted feeling pressure to engage in specific social events, while on the visit, and noted not wanting to do that. She continued pressure and then noted her friend proceeding to make comments about Brandy [name]'s poor attention. Brandy [name] noted feeling frustrated by this and noted that not doing what others want to do doesn't imply anything about attention span. He noted leaving the setting and working on managing her frustrations and relaxing. She noted feelings of being pissed and hurt. She noted later having a discussion with her friend. She noted no real resolution to this disagreement. She noted her effort to manage her frustration, speak clearly and concisely, and  advocated for herself. Brandy [name] was able to highlight positives in her visit and noted gratitude for the positives aspects of the trip. Therapist praised Brandy [name] for her effort to manage her frustration, deal with the situation adaptively, and take breaks when distressed. She noted her effort to not engage in discussions regarding politics with her friend and noted willingness to have discussions with various caveats. She noted the conversation being stressful and not resulting in any resolution. We worked on exploring her feelings and experience. Therapist praised Brandy [name] for her effort to manage her stress during the trip. Therapist encouraged Brandy [name] to continued her effort to manage her frustration and communicate positively and adaptively. Therapist provided supportive therapy. Gearline continues to benefit from treatment.   Interventions: CBT & interpersonal.   Diagnosis:  Other bipolar disorder [facility])  Psychiatric Treatment: Yes , [name], MD.    Treatment Plan:  [name], self-aware, and motivated for change.   Support System: Family and Friend.   Client Treatment Preferences Outpatient therapy.   Client Statement of Needs Brandy [name] would like to manage symptoms day to day, manage interpersonal relationships, prioritizing self, set boundaries for self and others,  improve time-management, manage dietary intake, engage inconsistent self-care, manage emotions and frustration tolerance being more mindful, being more purposeful, more strict adherence to medication regimen.   Treatment Level Weekly  Symptoms  Anxiety: feeling anxious, difficulty managing worry, worrying too much about different things, trouble relaxing, restlessness, irritability, and feeling afraid something awful might happen.   (Status: maintained) Depression: loss of interest, feeling down, lethargy, poor appetite, feeling bad about self, and  difficulty concentrating    (Status:  maintained)  Goals:   Brandy [name] experiences symptoms of depression and anxiety.   Treatment plan signed and available on s-drive:   Yes, please see patient chart for sign off.  Brandy [name] was sent the treatment plan signature form on 09/12/23.   Target Date: 09/11/24 Frequency: Weekly  Progress: 0 Modality: individual    Therapist will provide referrals for additional resources as appropriate.  Therapist will provide psycho-education regarding Brandy [name]'s diagnosis and corresponding treatment approaches and interventions. [name], LCSW will support the patient's ability to achieve the goals identified. will employ CBT, BA, Problem-solving, Solution Focused, Mindfulness,  coping skills, & other evidenced-based practices will be used to promote progress towards healthy functioning to help manage decrease symptoms associated with her diagnosis.   Reduce overall level, frequency, and intensity of the feelings of depression, anxiety and panic evidenced by decreased overall symptoms from 6 to 7 days/week to 0 to 1 days/week per client report for at least 3 consecutive months. Verbally express understanding of the relationship between feelings of depression, anxiety and their impact on thinking patterns and behaviors. Verbalize an understanding of the role that distorted thinking plays in creating fears, excessive worry, and ruminations.    [name] participated in the creation of the treatment plan)   [name], LCSW

## 2024-02-06 ENCOUNTER — Ambulatory Visit: Admitting: Psychology

## 2024-02-06 DIAGNOSIS — F3189 Other bipolar disorder: Secondary | ICD-10-CM

## 2024-02-06 NOTE — Progress Notes (Unsigned)
 [facility] Counselor/Therapist Progress Note  Patient ID: Brandy [name], MRN: [id number]   Date: 02/06/24  Time Spent: 12:10 pm - 1:02 pm :  52 minutes  Treatment Type: Individual Therapy.  Reported Symptoms: Bipolar, depression, and anxiety.   Mental Status Exam: Appearance:  Casual     Behavior: Appropriate and Rigid  Motor: Normal  Speech/Language:  Clear and Coherent  Affect: Congruent  Mood: normal  Thought process: normal  Thought content:   WNL  Sensory/Perceptual disturbances:   WNL  Orientation: oriented to person, place, time/date, and situation  Attention: Good  Concentration: Good  Memory: WNL  Fund of knowledge:  Good  Insight:   Fair  Judgment:  Fair  Impulse Control: Good   Risk Assessment: Danger to Self:  No Self-injurious Behavior: No Danger to Others: No Duty to Warn:no Physical Aggression / Violence:No  Access to Firearms a concern: No  Gang Involvement:No   Subjective:   Brandy [name] participated from home, via video and consented to treatment. Therapist participated from home office. Brandy [name] provided feedback regarding her past week.Therapist discussed frequent late starts to our sessions and worked on identifying possible barriers to starting sessions on time going forward. Brandy [name] was receptive to this discussion and noted her concerted effort, going forward, to arrive to the session at the start time. Brandy [name] noted a recent dental appointment and noted being recommended to have her teeth extracted and to get dentures or implants. She noted that she has a difficult time handling dental issues and sight of blood. She noted often having her husband manage her children's dental appointments as a result. She noted having difficulty reconciling this recent recommendation by her dentist. She noted doing her own research regarding this. She noted additional stressors including going through the house buying process. She noted working on getting a  loan and noted her father cosigning the loan. She noted these stressors affecting her eating. She noted I hate food and I love food. She noted being 100% a grazer and have been for a long long time.  She noted I always had something to munch on. When stressed, she noted this affecting her intake directly and noted often consuming more calories during this time. She noted loving chocolate and sugar and eats chocolate everyday. She needed to be more mindful about portions. She noted often eating more portions of carbohydrates. She noted I feel like I have a food problem. She noted difficulty reconciling her friends ability to self-start, maintain a routine, follow through on unenjoyable tasks. We worked on exploring this during the session and Brandy [name] often comparing herself to others. We will continue to explore this going forward. Brandy [name] was tearful during the session as she discussed the output she has in various areas without the results, at times.       Brandy [name] reflected on her trip to visit her friend and noted it generally going well but noted having two distinct arguments during her trip. She noted feeling pressure to engage in specific social events, while on the visit, and noted not wanting to do that. She continued pressure and then noted her friend proceeding to make comments about Brandy [name]'s poor attention. Brandy [name] noted feeling frustrated by this and noted that not doing what others want to do doesn't imply anything about attention span. Brandy [name] noted leaving the setting and working on managing her frustrations and relaxing. She noted feelings of being pissed and hurt. She noted later having a discussion with her friend. She  noted no real resolution to this disagreement. She noted her effort to manage her frustration, speak clearly and concisely, and advocated for herself. Brandy [name] was able to highlight positives in her visit and noted gratitude for the positives aspects of the trip. Therapist praised  Brandy [name] for her effort to manage her frustration, deal with the situation adaptively, and take breaks when distressed. She noted her effort to not engage in discussions regarding politics with her friend and noted willingness to have discussions with various caveats. She noted the conversation being stressful and not resulting in any resolution. We worked on exploring her feelings and experience. Therapist praised Brandy [name] for her effort to manage her stress during the trip. Therapist encouraged Brandy [name] to continued her effort to manage her frustration and communicate positively and adaptively. Therapist provided supportive therapy. Brandy [name] continues to benefit from treatment.   Interventions: CBT & interpersonal.   Diagnosis:  Other bipolar disorder [facility])  Psychiatric Treatment: Yes , Brandy Drone, MD.    Treatment Plan:  [name], self-aware, and motivated for change.   Support System: Family and Friend.   Client Treatment Preferences Outpatient therapy.   Client Statement of Needs Brandy [name] would like to manage symptoms day to day, manage interpersonal relationships, prioritizing self, set boundaries for self and others,  improve time-management, manage dietary intake, engage inconsistent self-care, manage emotions and frustration tolerance being more mindful, being more purposeful, more strict adherence to medication regimen.   Treatment Level Weekly  Symptoms  Anxiety: feeling anxious, difficulty managing worry, worrying too much about different things, trouble relaxing, restlessness, irritability, and feeling afraid something awful might happen.   (Status: maintained) Depression: loss of interest, feeling down, lethargy, poor appetite, feeling bad about self, and difficulty concentrating    (Status: maintained)  Goals:   Brandy [name] experiences symptoms of depression and anxiety.   Treatment plan signed and available on s-drive:   Yes, please see patient chart  for sign off.  Amos Gaber Richoux was sent the treatment plan signature form on 09/12/23.   Target Date: 09/11/24 Frequency: Weekly  Progress: 0 Modality: individual    Therapist will provide referrals for additional resources as appropriate.  Therapist will provide psycho-education regarding [name]'s diagnosis and corresponding treatment approaches and interventions. Brandy Mullet, LCSW will support the patient's ability to achieve the goals identified. will employ CBT, BA, Problem-solving, Solution Focused, Mindfulness,  coping skills, & other evidenced-based practices will be used to promote progress towards healthy functioning to help manage decrease symptoms associated with her diagnosis.   Reduce overall level, frequency, and intensity of the feelings of depression, anxiety and panic evidenced by decreased overall symptoms from 6 to 7 days/week to 0 to 1 days/week per client report for at least 3 consecutive months. Verbally express understanding of the relationship between feelings of depression, anxiety and their impact on thinking patterns and behaviors. Verbalize an understanding of the role that distorted thinking plays in creating fears, excessive worry, and ruminations.    [name] participated in the creation of the treatment plan)   Brandy Mullet, LCSW

## 2024-02-13 ENCOUNTER — Ambulatory Visit: Admitting: Psychology

## 2024-02-13 DIAGNOSIS — F3189 Other bipolar disorder: Secondary | ICD-10-CM | POA: Diagnosis not present

## 2024-02-13 NOTE — Progress Notes (Signed)
 [facility] Counselor/Therapist Progress Note  Patient ID: [name], MRN: [id number]   Date: 02/13/24  Time Spent: 12:07 pm - 1:03 pm :  56  minutes  Treatment Type: Individual Therapy.  Reported Symptoms: Bipolar, depression, and anxiety.   Mental Status Exam: Appearance:  Casual     Behavior: Appropriate and Rigid  Motor: Normal  Speech/Language:  Clear and Coherent  Affect: Congruent  Mood: dysthymic  Thought process: normal  Thought content:   WNL  Sensory/Perceptual disturbances:   WNL  Orientation: oriented to person, place, time/date, and situation  Attention: Good  Concentration: Good  Memory: WNL  Fund of knowledge:  Good  Insight:   Good and Fair  Judgment:  Good and Fair  Impulse Control: Good   Risk Assessment: Danger to Self:  No Self-injurious Behavior: No Danger to Others: No Duty to Warn:no Physical Aggression / Violence:No  Access to Firearms a concern: No  Gang Involvement:No   Subjective:   Brandy [name] participated from home, via video and consented to treatment. Therapist participated from home office. Brandy [name] provided feedback regarding her past week. She noted her commitment towards arriving to the session within 5 minutes of start time of our scheduled appointments. She noted a higher level of anxiety and stress due to the house purchasing process and her son's upcoming medical appointment that requires a blood draw. She noted an increase in over-thinking regarding whether to make home repairs to their home or wait until the appraisal is completed. She noted the financial stress of a minor repair. She noted her effort to highlight her circle of control. She noted rumination regarding the possibilities related to repairs, upgrades, and design details for the home, should her own the home. We worked on reflecting on how she manages this rumination. She noted setting duration limits and redirecting self. Therapist praised Brandy [name] for  her mindfulness and effort to manage her rumination. She noted high level of frustration regarding various barriers, hold ups, and unexpected changes related to her home purchasing process. We worked on processing and exploring these stressors between sessions. She noted her concerted effort, going forward, to not over extend during the holidays. She noted the stress of the holiday season, in the past, with the lack of commitment from family to participate and engage. Therapist praised Brandy [name] for her effort to set boundaries and  focus on own needs.  Therapist validated Brandy [name]'s feelings and experience during the session. Brandy [name] has a follow-up, and continues to benefit from treatment. We will work on exploring her frustration regarding the hypocrisy of others and wanting to make a positive difference for others.    Interventions: CBT & interpersonal.   Diagnosis:  Other bipolar disorder [facility])  Psychiatric Treatment: Yes , Brandy Drone, MD.    Treatment Plan:  [name], self-aware, and motivated for change.   Support System: Family and Friend.   Client Treatment Preferences Outpatient therapy.   Client Statement of Needs Brandy [name] would like to manage symptoms day to day, manage interpersonal relationships, prioritizing self, set boundaries for self and others,  improve time-management, manage dietary intake, engage inconsistent self-care, manage emotions and frustration tolerance being more mindful, being more purposeful, more strict adherence to medication regimen.   Treatment Level Weekly  Symptoms  Anxiety: feeling anxious, difficulty managing worry, worrying too much about different things, trouble relaxing, restlessness, irritability, and feeling afraid something awful might happen.   (Status: maintained) Depression: loss of interest, feeling down, lethargy,  poor appetite, feeling bad about self, and difficulty concentrating    (Status:  maintained)  Goals:   Brandy [name] experiences symptoms of depression and anxiety.   Treatment plan signed and available on s-drive:   Yes, please see patient chart for sign off.  Brandy [name] was sent the treatment plan signature form on 09/12/23.   Target Date: 09/11/24 Frequency: Weekly  Progress: 0 Modality: individual    Therapist will provide referrals for additional resources as appropriate.  Therapist will provide psycho-education regarding Brandy [name]'s diagnosis and corresponding treatment approaches and interventions. Brandy Mullet, LCSW will support the patient's ability to achieve the goals identified. will employ CBT, BA, Problem-solving, Solution Focused, Mindfulness,  coping skills, & other evidenced-based practices will be used to promote progress towards healthy functioning to help manage decrease symptoms associated with her diagnosis.   Reduce overall level, frequency, and intensity of the feelings of depression, anxiety and panic evidenced by decreased overall symptoms from 6 to 7 days/week to 0 to 1 days/week per client report for at least 3 consecutive months. Verbally express understanding of the relationship between feelings of depression, anxiety and their impact on thinking patterns and behaviors. Verbalize an understanding of the role that distorted thinking plays in creating fears, excessive worry, and ruminations.    Brandy [name] participated in the creation of the treatment plan)   Brandy Mullet, LCSW

## 2024-02-18 LAB — OPHTHALMOLOGY REPORT-SCANNED

## 2024-02-20 ENCOUNTER — Ambulatory Visit: Admitting: Psychology

## 2024-02-20 DIAGNOSIS — F3189 Other bipolar disorder: Secondary | ICD-10-CM

## 2024-02-20 NOTE — Progress Notes (Signed)
 [facility] Counselor/Therapist Progress Note  Patient ID: MARALEE HIGUCHI, MRN: [id number]   Date: 02/20/24  Time Spent: 12:07 pm -1:01 pm :  54 minutes  Treatment Type: Individual Therapy.  Reported Symptoms: Bipolar, depression, and anxiety.   Mental Status Exam: Appearance:  Casual     Behavior: Appropriate and Rigid  Motor: Normal  Speech/Language:  Clear and Coherent  Affect: Congruent  Mood: dysthymic  Thought process: normal  Thought content:   WNL  Sensory/Perceptual disturbances:   WNL  Orientation: oriented to person, place, time/date, and situation  Attention: Good  Concentration: Good  Memory: WNL  Fund of knowledge:  Good  Insight:   Good and Fair  Judgment:  Good and Fair  Impulse Control: Good   Risk Assessment: Danger to Self:  No Self-injurious Behavior: No Danger to Others: No Duty to Warn:no Physical Aggression / Violence:No  Access to Firearms a concern: No  Gang Involvement:No   Subjective:   Liliana Dang Giebler participated from home, via video and consented to treatment. Therapist participated from home office. Sheniqua provided feedback regarding her past week. She noted being in oral pain due to a dental issue. She noted noted this often vacillating. She noted not enjoying the cold weather. She noted a recent disagreement with her husband and noted her effort communicate positively, assertively, and highlighted the responsibility of all involved. She noted her effort to not manage the emotional labor in the home. She noted her effort to manage her frustration during the conversation. She noted feeling a sense of pride regarding her approach. She noted feeling upset and mad that she was recommended to get dentures but noted acknowledging some positives of this including a lack of dental pain. Additional stressors include her son's upcoming doctor appointment for annual labs that necessitates anesthesia and continuing her effort help her father  with a home related issue. She noted feeling like she can be sweet-talked into anything. She noted I think I am that person. She noted I am a sucker for that thing. She noted that this really feeds me regardless of the intention. We worked on exploring this during the session. She noted that with family it feels like a certain love feeling. We worked on exploring this during the session. Coretha will work on identifying contributing factors to this experience along with the pros and cons of this approach. Taleia will work on taking an inventory and we will continues to explore this going forward. Therapist validated Mara's feelings and experience and provided supportive therapy. Klani continues to benefit from counseling and has a follow-up scheduled.   Interventions: CBT & interpersonal.   Diagnosis:  Other bipolar disorder [facility])  Psychiatric Treatment: Yes , [name], MD.    Treatment Plan:  Client Abilities/Strengths Zarahi is intelligent, self-aware, and motivated for change.   Support System: Family and Friend.   Client Treatment Preferences Outpatient therapy.   Client Statement of Needs Shakina would like to manage symptoms day to day, manage interpersonal relationships, prioritizing self, set boundaries for self and others,  improve time-management, manage dietary intake, engage inconsistent self-care, manage emotions and frustration tolerance being more mindful, being more purposeful, more strict adherence to medication regimen.   Treatment Level Weekly  Symptoms  Anxiety: feeling anxious, difficulty managing worry, worrying too much about different things, trouble relaxing, restlessness, irritability, and feeling afraid something awful might happen.   (Status: maintained) Depression: loss of interest, feeling down, lethargy, poor appetite, feeling bad about self, and difficulty  concentrating    (Status: maintained)  Goals:   Airiel experiences symptoms of depression and  anxiety.   Treatment plan signed and available on s-drive:   Yes, please see patient chart for sign off.  Kitzia Camus Roots was sent the treatment plan signature form on 09/12/23.   Target Date: 09/11/24 Frequency: Weekly  Progress: 0 Modality: individual    Therapist will provide referrals for additional resources as appropriate.  Therapist will provide psycho-education regarding Tashawn's diagnosis and corresponding treatment approaches and interventions. [name], LCSW will support the patient's ability to achieve the goals identified. will employ CBT, BA, Problem-solving, Solution Focused, Mindfulness,  coping skills, & other evidenced-based practices will be used to promote progress towards healthy functioning to help manage decrease symptoms associated with her diagnosis.   Reduce overall level, frequency, and intensity of the feelings of depression, anxiety and panic evidenced by decreased overall symptoms from 6 to 7 days/week to 0 to 1 days/week per client report for at least 3 consecutive months. Verbally express understanding of the relationship between feelings of depression, anxiety and their impact on thinking patterns and behaviors. Verbalize an understanding of the role that distorted thinking plays in creating fears, excessive worry, and ruminations.    [name] participated in the creation of the treatment plan)   [name], LCSW

## 2024-02-21 DIAGNOSIS — E1169 Type 2 diabetes mellitus with other specified complication: Secondary | ICD-10-CM | POA: Diagnosis not present

## 2024-02-21 DIAGNOSIS — R635 Abnormal weight gain: Secondary | ICD-10-CM | POA: Diagnosis not present

## 2024-02-21 DIAGNOSIS — R5383 Other fatigue: Secondary | ICD-10-CM | POA: Diagnosis not present

## 2024-02-21 DIAGNOSIS — Z9884 Bariatric surgery status: Secondary | ICD-10-CM | POA: Diagnosis not present

## 2024-02-27 ENCOUNTER — Ambulatory Visit: Admitting: Psychology

## 2024-02-27 DIAGNOSIS — F3189 Other bipolar disorder: Secondary | ICD-10-CM | POA: Diagnosis not present

## 2024-02-27 NOTE — Progress Notes (Signed)
 [facility] Counselor/Therapist Progress Note  Patient ID: MAZZY SANTARELLI, MRN: [id number]   Date: 02/27/24  Time Spent: 12:07 pm - 1:01 pm :  54 minutes  Treatment Type: Individual Therapy.  Reported Symptoms: Bipolar, depression, and anxiety.   Mental Status Exam: Appearance:  Casual     Behavior: Appropriate and Rigid  Motor: Normal  Speech/Language:  Clear and Coherent  Affect: Congruent  Mood: dysthymic  Thought process: normal  Thought content:   WNL  Sensory/Perceptual disturbances:   WNL  Orientation: oriented to person, place, time/date, and situation  Attention: Good  Concentration: Good  Memory: WNL  Fund of knowledge:  Good  Insight:   Good and Fair  Judgment:  Good and Fair  Impulse Control: Good   Risk Assessment: Danger to Self:  No Self-injurious Behavior: No Danger to Others: No Duty to Warn:no Physical Aggression / Violence:No  Access to Firearms a concern: No  Gang Involvement:No   Subjective:   Demisha Nokes Downum participated from home, via video and consented to treatment. Therapist participated from home office. [name] provided feedback regarding her past week. We continued to process her difficulty maintaining boundaries when she is sweet talked by others regardless of who they are. She noted this being manipulation. She noted her awareness of this. She noted that she is able to set boundaries, when necessary. We worked on exploring the pros and cons of this approach. She noted the positive of this, the dopamine release of this type of interaction. Therapist highlighted the possible stressors related to this dynamic including increasing stress in the relationship, rising frustration with her son, providing mixed messages, and the overall message provided. She noted feeling loved, appreciated, and warm and fuzzy. We worked on exploring this during the session. Nicie had a difficulty time further describing her experience. She did acknowledge  What is not normal is allowing people to manipulate me. We worked on processing this during the session. Therapist provided a feeling wheel handout, via email, for reference and review. Therapist encouraged to employ this handout to aid in further identifying her feelings. Therapist modeled this during the session. Therapist encouraged Kuulei to identify what she would like the dynamics between her and her son to be. Therapist provided supportive therapy. A Follow-up was scheduled for continued treatment, which Daesia benefits from.    Interventions: CBT & interpersonal.   Diagnosis:  Other bipolar disorder [facility])  Psychiatric Treatment: Yes , [name], MD.    Treatment Plan:  Client Abilities/Strengths Lanaiya is intelligent, self-aware, and motivated for change.   Support System: Family and Friend.   Client Treatment Preferences Outpatient therapy.   Client Statement of Needs Mariely would like to manage symptoms day to day, manage interpersonal relationships, prioritizing self, set boundaries for self and others,  improve time-management, manage dietary intake, engage inconsistent self-care, manage emotions and frustration tolerance being more mindful, being more purposeful, more strict adherence to medication regimen.   Treatment Level Weekly  Symptoms  Anxiety: feeling anxious, difficulty managing worry, worrying too much about different things, trouble relaxing, restlessness, irritability, and feeling afraid something awful might happen.   (Status: maintained) Depression: loss of interest, feeling down, lethargy, poor appetite, feeling bad about self, and difficulty concentrating    (Status: maintained)  Goals:   Terie experiences symptoms of depression and anxiety.   Treatment plan signed and available on s-drive:   Yes, please see patient chart for sign off.  Liat Mayol Shoultz was sent the treatment plan signature  form on 09/12/23.   Target Date: 09/11/24 Frequency: Weekly   Progress: 0 Modality: individual    Therapist will provide referrals for additional resources as appropriate.  Therapist will provide psycho-education regarding Khadejah's diagnosis and corresponding treatment approaches and interventions. [name], LCSW will support the patient's ability to achieve the goals identified. will employ CBT, BA, Problem-solving, Solution Focused, Mindfulness,  coping skills, & other evidenced-based practices will be used to promote progress towards healthy functioning to help manage decrease symptoms associated with her diagnosis.   Reduce overall level, frequency, and intensity of the feelings of depression, anxiety and panic evidenced by decreased overall symptoms from 6 to 7 days/week to 0 to 1 days/week per client report for at least 3 consecutive months. Verbally express understanding of the relationship between feelings of depression, anxiety and their impact on thinking patterns and behaviors. Verbalize an understanding of the role that distorted thinking plays in creating fears, excessive worry, and ruminations.    [name] participated in the creation of the treatment plan)   [name], LCSW

## 2024-03-05 ENCOUNTER — Ambulatory Visit: Admitting: Psychology

## 2024-03-05 DIAGNOSIS — F3189 Other bipolar disorder: Secondary | ICD-10-CM | POA: Diagnosis not present

## 2024-03-05 NOTE — Progress Notes (Signed)
  [facility] Counselor/Therapist Progress Note  Patient ID: Brandy [name], MRN: [id number]   Date: 03/05/24  Time Spent: 12:05 pm - 1:00 pm :  55  minutes  Treatment Type: Individual Therapy.  Reported Symptoms: Bipolar, depression, and anxiety.   Mental Status Exam: Appearance:  Casual     Behavior: Appropriate and Rigid  Motor: Normal  Speech/Language:  Clear and Coherent  Affect: Congruent  Mood: dysthymic  Thought process: normal  Thought content:   WNL  Sensory/Perceptual disturbances:   WNL  Orientation: oriented to person, place, time/date, and situation  Attention: Good  Concentration: Good  Memory: WNL  Fund of knowledge:  Good  Insight:   Good and Fair  Judgment:  Good and Fair  Impulse Control: Good   Risk Assessment: Danger to Self:  No Self-injurious Behavior: No Danger to Others: No Duty to Warn:no Physical Aggression / Violence:No  Access to Firearms a concern: No  Gang Involvement:No   Subjective:   Brandy [name] Dy participated from car, via video and consented to treatment. Therapist participated from home office. Brandy [name] provided feedback regarding her past week. She noted a recent disagreement with her husband and noted her feelings. She discussed frustration regarding her husband asking about plans that he had already discussed and noted this being a trigger point for Brandy [name]. She noted it's his way of taking no responsibility and putting it all on me. Brandy [name] noted that her husband is often half-assed, puts low energy, and doesn't have drive to take responsibility. She described her husband as someone who wants someone to do everything for him. She noted not being proud of her behavior. She noted frustration and a need for him to hear feedback. She noted various attempts to communicate and address these issues to no avail. She noted her rising frustration which can affect their interaction and noted worry that this could greatly affect  their relationship going forward. . She noted a need to communicate her feelings in a more adaptive manner and to be concise and assertive. She noted wanting to focus the follow-up session. Therapist validated Brandy [name]'s feelings and frustration during the session. We discussed the importance of employing positive communication and conflict resolutions skills. Therapist praised Brandy [name] for her effort during the session and provided supportive therapy.   Interventions: CBT & interpersonal.   Diagnosis:  Other bipolar disorder [facility])  Psychiatric Treatment: Yes , Brandy Drone, MD.    Treatment Plan:  [name], self-aware, and motivated for change.   Support System: Family and Friend.   Client Treatment Preferences Outpatient therapy.   Client Statement of Needs Brandy [name] would like to manage symptoms day to day, manage interpersonal relationships, prioritizing self, set boundaries for self and others,  improve time-management, manage dietary intake, engage inconsistent self-care, manage emotions and frustration tolerance being more mindful, being more purposeful, more strict adherence to medication regimen.   Treatment Level Weekly  Symptoms  Anxiety: feeling anxious, difficulty managing worry, worrying too much about different things, trouble relaxing, restlessness, irritability, and feeling afraid something awful might happen.   (Status: maintained) Depression: loss of interest, feeling down, lethargy, poor appetite, feeling bad about self, and difficulty concentrating    (Status: maintained)  Goals:   Brandy [name] experiences symptoms of depression and anxiety.   Treatment plan signed and available on s-drive:   Yes, please see patient chart for sign off.  Brandy [name] was sent the treatment plan signature form on 09/12/23.   Target Date: 09/11/24  Frequency: Weekly  Progress: 0 Modality: individual    Therapist will provide referrals for additional  resources as appropriate.  Therapist will provide psycho-education regarding Brandy [name]'s diagnosis and corresponding treatment approaches and interventions. Brandy Mullet, LCSW will support the patient's ability to achieve the goals identified. will employ CBT, BA, Problem-solving, Solution Focused, Mindfulness,  coping skills, & other evidenced-based practices will be used to promote progress towards healthy functioning to help manage decrease symptoms associated with her diagnosis.   Reduce overall level, frequency, and intensity of the feelings of depression, anxiety and panic evidenced by decreased overall symptoms from 6 to 7 days/week to 0 to 1 days/week per client report for at least 3 consecutive months. Verbally express understanding of the relationship between feelings of depression, anxiety and their impact on thinking patterns and behaviors. Verbalize an understanding of the role that distorted thinking plays in creating fears, excessive worry, and ruminations.    Brandy [name] participated in the creation of the treatment plan)   Brandy Mullet, LCSW

## 2024-03-12 ENCOUNTER — Ambulatory Visit: Admitting: Psychology

## 2024-03-12 DIAGNOSIS — F3189 Other bipolar disorder: Secondary | ICD-10-CM | POA: Diagnosis not present

## 2024-03-12 NOTE — Progress Notes (Signed)
 [facility] Counselor/Therapist Progress Note  Patient ID: Brandy [name], MRN: [id number]   Date: 03/12/24  Time Spent: 12:04 pm - 1:03 pm :  59  minutes  Treatment Type: Individual Therapy.  Reported Symptoms: Bipolar, depression, and anxiety.   Mental Status Exam: Appearance:  Casual     Behavior: Appropriate and Rigid  Motor: Normal  Speech/Language:  Clear and Coherent  Affect: Congruent  Mood: anxious and dysthymic  Thought process: normal  Thought content:   WNL  Sensory/Perceptual disturbances:   WNL  Orientation: oriented to person, place, time/date, and situation  Attention: Good  Concentration: Good  Memory: WNL  Fund of knowledge:  Good  Insight:   Good and Fair  Judgment:  Good and Fair  Impulse Control: Good   Risk Assessment: Danger to Self:  No Self-injurious Behavior: No Danger to Others: No Duty to Warn:no Physical Aggression / Violence:No  Access to Firearms a concern: No  Gang Involvement:No   Subjective:   Brandy [name] participated from car, via video and consented to treatment. Therapist participated from home office. Brandy [name] provided feedback regarding her past week. She noted the rising stressors related to purchasing her home. She noted difficulty balancing areas out of control while trying to be positive. She noted not counting on things prior to them coming to fruition. WE worked on identifying how she would like to approach this in a way that is viable for her. Brandy [name] noted a need to focus on being hopeful while being mindful of her areas of control and lack of control. She noted experiencing significant physiological tension as a result of the home buying process and noted her interest in getting a massage after this process is complete. We worked on identifying ways to manage this tension. Therapist highlighted the benefits of stretching and relaxation to manage tension. Brandy [name] noted being receptive to this during the session. She  noted recently having a conversation with a friend and noted fighting her instinct to respond negatively to her friend's antagonism. Brandy [name] noted a sense of pride for being purposeful with her response. She noted difficulty trusting her friend with information and often withholding due to worry that her friend would be judgmental or critical. She noted difficulty reconciling the lack of trust, in some areas, with her closest friend. She noted wondering if she has trust issues. We worked on exploring this during the session. Antithetically, she noted often trusting others, wholly, until she is given a reason not to trust them. We worked on identifying the effects of her approach in this area. We will continue to process this going forward. Brandy [name] was engaged and motivated during the session. She expressed commitment towards goals. Therapist encouraged Brandy [name] to work on identifying a balance of this and administrator, arts. Brandy [name] has a scheduled follow-up was for continued treatment, which Brandy [name] benefits from.  Interventions: CBT & interpersonal.   Diagnosis:  Other bipolar disorder [facility])  Psychiatric Treatment: Yes , Brandy Drone, MD.    Treatment Plan:  [name], self-aware, and motivated for change.   Support System: Family and Friend.   Client Treatment Preferences Outpatient therapy.   Client Statement of Needs Brandy [name] would like to manage symptoms day to day, manage interpersonal relationships, prioritizing self, set boundaries for self and others,  improve time-management, manage dietary intake, engage inconsistent self-care, manage emotions and frustration tolerance being more mindful, being more purposeful, more strict adherence to medication regimen.   Treatment Level Weekly  Symptoms  Anxiety: feeling anxious, difficulty managing worry, worrying too much about different things, trouble relaxing, restlessness, irritability, and feeling afraid something  awful might happen.   (Status: maintained) Depression: loss of interest, feeling down, lethargy, poor appetite, feeling bad about self, and difficulty concentrating    (Status: maintained)  Goals:   [name] experiences symptoms of depression and anxiety.   Treatment plan signed and available on s-drive:   Yes, please see patient chart for sign off.  Brandy [name] was sent the treatment plan signature form on 09/12/23.   Target Date: 09/11/24 Frequency: Weekly  Progress: 0 Modality: individual    Therapist will provide referrals for additional resources as appropriate.  Therapist will provide psycho-education regarding [name]'s diagnosis and corresponding treatment approaches and interventions. Brandy Mullet, LCSW will support the patient's ability to achieve the goals identified. will employ CBT, BA, Problem-solving, Solution Focused, Mindfulness,  coping skills, & other evidenced-based practices will be used to promote progress towards healthy functioning to help manage decrease symptoms associated with her diagnosis.   Reduce overall level, frequency, and intensity of the feelings of depression, anxiety and panic evidenced by decreased overall symptoms from 6 to 7 days/week to 0 to 1 days/week per client report for at least 3 consecutive months. Verbally express understanding of the relationship between feelings of depression, anxiety and their impact on thinking patterns and behaviors. Verbalize an understanding of the role that distorted thinking plays in creating fears, excessive worry, and ruminations.    [name] participated in the creation of the treatment plan)   Brandy Mullet, LCSW

## 2024-03-19 ENCOUNTER — Ambulatory Visit: Admitting: Psychology

## 2024-03-19 DIAGNOSIS — F3189 Other bipolar disorder: Secondary | ICD-10-CM | POA: Diagnosis not present

## 2024-03-19 NOTE — Progress Notes (Signed)
 [facility] Counselor/Therapist Progress Note  Patient ID: Brandy [name], MRN: [id number]   Date: 03/19/24  Time Spent: 12:05 pm - 1:02 pm :  57 minutes  Treatment Type: Individual Therapy.  Reported Symptoms: Bipolar, depression, and anxiety.   Mental Status Exam: Appearance:  Casual     Behavior: Appropriate and Rigid  Motor: Normal  Speech/Language:  Clear and Coherent  Affect: Congruent  Mood: anxious and dysthymic  Thought process: normal  Thought content:   WNL  Sensory/Perceptual disturbances:   WNL  Orientation: oriented to person, place, time/date, and situation  Attention: Good  Concentration: Good  Memory: WNL  Fund of knowledge:  Good  Insight:   Good and Fair  Judgment:  Good and Fair  Impulse Control: Good   Risk Assessment: Danger to Self:  No Self-injurious Behavior: No Danger to Others: No Duty to Warn:no Physical Aggression / Violence:No  Access to Firearms a concern: No  Gang Involvement:No   Subjective:   Brandy [name] participated from car, via video and consented to treatment. Therapist participated from home office. Karren provided feedback regarding her past week. She noted recently closing on a home and noted the feelings of gratitude and appreciation. She noted various frustrations regarding the home purchase and noted her effort to advocate for self and maintain assertiveness. We worked on exploring her experience and processing her feelings during the session. She noted the juxtaposition of this experience being a happy moment in her life while having to deal with various stressors and having to deal with difficulty personalities. She noted a need to verbalize her feelings regarding this issue and noted having deal with antagonistic personalities. She noted it leaves a bad taste in my mouth. She noted focusing on the positives. She noted frustrations other areas of her life and providing scathing feedback only to feeling a sense  of regret and ntoed a need to need to be more positive, look past negative feelings, and maintaining perspective. We worked exploring this during the session and ways to balance this with being direct and proactive, what it calls for it. She noted her need to be able to continue working on giving feedback while being respectful. She noted her mother always putting me done for being a good person, as a child. She noted how painful that was. We worked on weighing positive thinking while mitigating risk and how to balance this with one another. She noted this has been a challenge in the past and noted a need to work more in this area. We will continue to explore this going forward. Therapist praised Brandy [name] for her effort and vulnerability during the session, validated her feelings, and provided supportive therapy. Shamyia continues to benefit from treatment.    Interventions: CBT & interpersonal.   Diagnosis:  Other bipolar disorder [facility])  Psychiatric Treatment: Yes , [name], MD.    Treatment Plan:  Client Abilities/Strengths [name] is intelligent, self-aware, and motivated for change.   Support System: Family and Friend.   Client Treatment Preferences Outpatient therapy.   Client Statement of Needs Pheonix would like to manage symptoms day to day, manage interpersonal relationships, prioritizing self, set boundaries for self and others,  improve time-management, manage dietary intake, engage inconsistent self-care, manage emotions and frustration tolerance being more mindful, being more purposeful, more strict adherence to medication regimen.   Treatment Level Weekly  Symptoms  Anxiety: feeling anxious, difficulty managing worry, worrying too much about different things, trouble relaxing, restlessness, irritability, and feeling  afraid something awful might happen.   (Status: maintained) Depression: loss of interest, feeling down, lethargy, poor appetite, feeling bad about self, and  difficulty concentrating    (Status: maintained)  Goals:   Brandin experiences symptoms of depression and anxiety.   Treatment plan signed and available on s-drive:   Yes, please see patient chart for sign off.  Sadeel Fiddler Epperly was sent the treatment plan signature form on 09/12/23.   Target Date: 09/11/24 Frequency: Weekly  Progress: 0 Modality: individual    Therapist will provide referrals for additional resources as appropriate.  Therapist will provide psycho-education regarding Chanler's diagnosis and corresponding treatment approaches and interventions. [name], LCSW will support the patient's ability to achieve the goals identified. will employ CBT, BA, Problem-solving, Solution Focused, Mindfulness,  coping skills, & other evidenced-based practices will be used to promote progress towards healthy functioning to help manage decrease symptoms associated with her diagnosis.   Reduce overall level, frequency, and intensity of the feelings of depression, anxiety and panic evidenced by decreased overall symptoms from 6 to 7 days/week to 0 to 1 days/week per client report for at least 3 consecutive months. Verbally express understanding of the relationship between feelings of depression, anxiety and their impact on thinking patterns and behaviors. Verbalize an understanding of the role that distorted thinking plays in creating fears, excessive worry, and ruminations.    [name] participated in the creation of the treatment plan)   [name], LCSW

## 2024-04-02 ENCOUNTER — Ambulatory Visit: Admitting: Psychology

## 2024-04-02 DIAGNOSIS — F3189 Other bipolar disorder: Secondary | ICD-10-CM | POA: Diagnosis not present

## 2024-04-02 NOTE — Progress Notes (Signed)
 [facility] Counselor/Therapist Progress Note  Patient ID: Brandy [name], MRN: [id number]   Date: 04/02/24  Time Spent: 12:06 pm - 1:58 pm :  52 minutes  Treatment Type: Individual Therapy.  Reported Symptoms: Bipolar, depression, and anxiety.   Mental Status Exam: Appearance:  Casual     Behavior: Appropriate and Rigid  Motor: Normal  Speech/Language:  Clear and Coherent  Affect: Congruent  Mood: anxious and dysthymic  Thought process: normal  Thought content:   WNL  Sensory/Perceptual disturbances:   WNL  Orientation: oriented to person, place, time/date, and situation  Attention: Good  Concentration: Good  Memory: WNL  Fund of knowledge:  Good  Insight:   Good and Fair  Judgment:  Good and Fair  Impulse Control: Good   Risk Assessment: Danger to Self:  No Self-injurious Behavior: No Danger to Others: No Duty to Warn:no Physical Aggression / Violence:No  Access to Firearms a concern: No  Gang Involvement:No   Subjective:   Brandy [name] participated from car, via video and consented to treatment. Therapist participated from home office. Brandy [name] provided feedback regarding her past week. Brandy [name] noted that her friend, Brandy [name], is pissed when learning that Us[place] a home. She noted their interactions, since that time, have been scant. Brandy [name] noted her effort to communicate her feelings and invited her friend to communicate regarding this stressor, to no avail. She noted frustration regarding this reaction and noted that her friend has also kept various life events and transitions private until a certain point. She noted working on matching her friend's level of investment. Brandy [name] noted not liking this behavior at all. We worked on processing this experience and her feelings in relation to. She noted her feelings being hurt that her husband stated that her specific dish was okay and that he preferred boxed mac and cheese. She noted being almost in tears  and I was pissed. She noted wishing that her husband did not give this feedback and waited until after thanksgiving. She noted that she did not interact with her husband much after this point. She noted her son, Brandy [name], being supportive in the moment. She noted her husband's procurement of an ED pill and noted a negative reaction regarding this due to his general lack of action and interest in meeting her needs as he navigates his difficulty with ED. She noted being supportive of his pursuit to address his ED but noted feeling resentment regarding her husband's lack of meeting her needs in the meantime. She noted being mad and frustrated. We worked on exploring this during the session. She noted difficulty understanding her husband's perspective and lack of prioritization of her needs. Therapist encouraged additional communication and highlighted the possibility of differences in how she and her husband view intimacy. Brandy [name] was engaged and motivated during the session. Therapist encouraged communication, empathy, and self-care. Brandy [name] continues to benefit from therapy and has a follow-up scheduled.     Interventions: CBT & interpersonal.   Diagnosis:  Other bipolar disorder [facility])  Psychiatric Treatment: Yes , Brandy Drone, MD.    Treatment Plan:  [name], self-aware, and motivated for change.   Support System: Family and Friend.   Client Treatment Preferences Outpatient therapy.   Client Statement of Needs Brandy [name] would like to manage symptoms day to day, manage interpersonal relationships, prioritizing self, set boundaries for self and others,  improve time-management, manage dietary intake, engage inconsistent self-care, manage emotions and frustration tolerance being more mindful, being more  purposeful, more strict adherence to medication regimen.   Treatment Level Weekly  Symptoms  Anxiety: feeling anxious, difficulty managing worry, worrying too much  about different things, trouble relaxing, restlessness, irritability, and feeling afraid something awful might happen.   (Status: maintained) Depression: loss of interest, feeling down, lethargy, poor appetite, feeling bad about self, and difficulty concentrating    (Status: maintained)  Goals:   Brandy [name] experiences symptoms of depression and anxiety.   Treatment plan signed and available on s-drive:   Yes, please see patient chart for sign off.  [name] was sent the treatment plan signature form on 09/12/23.   Target Date: 09/11/24 Frequency: Weekly  Progress: 0 Modality: individual    Therapist will provide referrals for additional resources as appropriate.  Therapist will provide psycho-education regarding [name]'s diagnosis and corresponding treatment approaches and interventions. Brandy Mullet, LCSW will support the patient's ability to achieve the goals identified. will employ CBT, BA, Problem-solving, Solution Focused, Mindfulness,  coping skills, & other evidenced-based practices will be used to promote progress towards healthy functioning to help manage decrease symptoms associated with her diagnosis.   Reduce overall level, frequency, and intensity of the feelings of depression, anxiety and panic evidenced by decreased overall symptoms from 6 to 7 days/week to 0 to 1 days/week per client report for at least 3 consecutive months. Verbally express understanding of the relationship between feelings of depression, anxiety and their impact on thinking patterns and behaviors. Verbalize an understanding of the role that distorted thinking plays in creating fears, excessive worry, and ruminations.    [name] participated in the creation of the treatment plan)   Brandy Mullet, LCSW

## 2024-04-06 DIAGNOSIS — R41840 Attention and concentration deficit: Secondary | ICD-10-CM | POA: Diagnosis not present

## 2024-04-06 DIAGNOSIS — F411 Generalized anxiety disorder: Secondary | ICD-10-CM | POA: Diagnosis not present

## 2024-04-06 DIAGNOSIS — F5105 Insomnia due to other mental disorder: Secondary | ICD-10-CM | POA: Diagnosis not present

## 2024-04-06 DIAGNOSIS — F3181 Bipolar II disorder: Secondary | ICD-10-CM | POA: Diagnosis not present

## 2024-04-09 ENCOUNTER — Ambulatory Visit: Admitting: Psychology

## 2024-04-09 DIAGNOSIS — F3189 Other bipolar disorder: Secondary | ICD-10-CM

## 2024-04-09 NOTE — Progress Notes (Signed)
  [facility] Counselor/Therapist Progress Note  Patient ID: Brandy [name], MRN: [id number]   Date: 04/09/2024  Time Spent: 12:12 pm - 1:01 pm :  49 minutes  Treatment Type: Individual Therapy.  Reported Symptoms: Bipolar, depression, and anxiety.   Mental Status Exam: Appearance:  Casual     Behavior: Appropriate and Rigid  Motor: Normal  Speech/Language:  Clear and Coherent  Affect: Congruent  Mood: anxious and dysthymic  Thought process: normal  Thought content:   WNL  Sensory/Perceptual disturbances:   WNL  Orientation: oriented to person, place, time/date, and situation  Attention: Good  Concentration: Good  Memory: WNL  Fund of knowledge:  Good  Insight:   Good and Fair  Judgment:  Good and Fair  Impulse Control: Good   Risk Assessment: Danger to Self:  No Self-injurious Behavior: No Danger to Others: No Duty to Warn:no Physical Aggression / Violence:No  Access to Firearms a concern: No  Gang Involvement:No   Subjective:   Brandy [name] participated from car, via video and consented to treatment. Therapist participated from home office. Lu provided feedback regarding her past week. Brandy [name] noted her interest in delaying her possible conversation with her husband regarding his ED. She noted a need to identify how to address this going so that she can be better prepared for the conversation. We will work on this going forward. She noted continued strain with her friend, Brandy [name], and noted continuing to match her energy. She noted the juxtaposition regarding her friend wanting assistance with a work health visitor. Brandy [name] noted having to prioritize herself and noted recently feeling sick.  She noted this resulting in unspoken stress between them. Brandy [name] noted feeling positive about prioritizing self. She noted continued frustration regarding her friend's lack of willingness to address their relationship strain despite attempting to address the issue herself on three  separate occasions. She noted continued struggles with reconciling shifting her behavior towards a person upon discovering a history of incarceration and being currently on parol. She noted attempting to make sense of this while noting its her personality to trust people until they do something to violate that trust. She noted hating having to change her behavior towards him. We worked on exploring this during the session. We worked on exploring the balance between caring for others and safeguarding self. Therapist encouraged introspection in this area and Brandy [name] was receptive to this. We will work on further delineating this during our follow-up and ways to maintain sense of self. Therapist validated Brandy [name]'s feelings and experience during the session and provided supportive therapy. A Follow-up was scheduled for continued treatment, which Brandy [name] benefits from.    Interventions: CBT & interpersonal.   Diagnosis:  Other bipolar disorder [facility])  Psychiatric Treatment: Yes , [name], MD.    Treatment Plan:  [name], self-aware, and motivated for change.   Support System: Family and Friend.   Client Treatment Preferences Outpatient therapy.   Client Statement of Needs [name] would like to manage symptoms day to day, manage interpersonal relationships, prioritizing self, set boundaries for self and others,  improve time-management, manage dietary intake, engage inconsistent self-care, manage emotions and frustration tolerance being more mindful, being more purposeful, more strict adherence to medication regimen.   Treatment Level Weekly  Symptoms  Anxiety: feeling anxious, difficulty managing worry, worrying too much about different things, trouble relaxing, restlessness, irritability, and feeling afraid something awful might happen.   (Status: maintained) Depression: loss of interest, feeling down, lethargy, poor appetite,  feeling bad about self, and  difficulty concentrating    (Status: maintained)  Goals:   Brandy [name] experiences symptoms of depression and anxiety.   Treatment plan signed and available on s-drive:   Yes, please see patient chart for sign off.  [name] was sent the treatment plan signature form on 09/12/23.   Target Date: 09/11/24 Frequency: Weekly  Progress: 0 Modality: individual    Therapist will provide referrals for additional resources as appropriate.  Therapist will provide psycho-education regarding Brandy [name]'s diagnosis and corresponding treatment approaches and interventions. Brandy Mullet, LCSW will support the patient's ability to achieve the goals identified. will employ CBT, BA, Problem-solving, Solution Focused, Mindfulness,  coping skills, & other evidenced-based practices will be used to promote progress towards healthy functioning to help manage decrease symptoms associated with her diagnosis.   Reduce overall level, frequency, and intensity of the feelings of depression, anxiety and panic evidenced by decreased overall symptoms from 6 to 7 days/week to 0 to 1 days/week per client report for at least 3 consecutive months. Verbally express understanding of the relationship between feelings of depression, anxiety and their impact on thinking patterns and behaviors. Verbalize an understanding of the role that distorted thinking plays in creating fears, excessive worry, and ruminations.    [name] participated in the creation of the treatment plan)   Brandy Mullet, LCSW

## 2024-04-16 ENCOUNTER — Ambulatory Visit (INDEPENDENT_AMBULATORY_CARE_PROVIDER_SITE_OTHER): Admitting: Psychology

## 2024-04-16 DIAGNOSIS — F3189 Other bipolar disorder: Secondary | ICD-10-CM | POA: Diagnosis not present

## 2024-04-16 NOTE — Progress Notes (Signed)
 [facility] Counselor/Therapist Progress Note    Patient ID: Brandy [name], MRN: [id number]   Date: 04/16/2024  Time Spent: 12:07 pm - 1:01 pm :  54 minutes  Treatment Type: Individual Therapy.  Reported Symptoms: Bipolar, depression, and anxiety.   Mental Status Exam: Appearance:  Casual     Behavior: Appropriate and Rigid  Motor: Normal  Speech/Language:  Clear and Coherent  Affect: Congruent  Mood: anxious and dysthymic  Thought process: normal  Thought content:   WNL  Sensory/Perceptual disturbances:   WNL  Orientation: oriented to person, place, time/date, and situation  Attention: Good  Concentration: Good  Memory: WNL  Fund of knowledge:  Good  Insight:   Good and Fair  Judgment:  Good and Fair  Impulse Control: Good   Risk Assessment: Danger to Self:  No Self-injurious Behavior: No Danger to Others: No Duty to Warn:no Physical Aggression / Violence:No  Access to Firearms a concern: No  Gang Involvement:No   Subjective:   Brandy [name] participated from car, via video and consented to treatment. Therapist participated from home office. Brandy [name] provided feedback regarding her past week. Brandy [name] noted stressors related to her cousin's (Brandy [name]) recent cancer diagnosis and noted the prognosis being poor but noted additional testing being scheduled. Brandy [name] noted preparing herself for this loss. We worked on processing this during the session. She noted her father recently announced that he would not be visiting during Christmas. Brandy [name] noted being glad that he wasn't going to visit and noted that her father was not invited for Christmas. She noted previously setting a boundary with her father to not visit without communicating and receiving permission. She noted her effort to continue navigating her strained relationship with her friend. She noted her attempt to increase her communication with her friend but noted her friend's responses being minimal. She noted  feelings of frustration regarding this and noted her disinterest in this dynamic carrying through in 2026. We worked on processing this and her perception of her friend's motivation and intent. Therapist challenged Brandy [name]'s jumps to conclusions and emotional reasoning. Brandy [name] noted working on teacher, adult education for 2026 including working on finances, her home, marriage, and personal health. Therapist encouraged Brandy [name] to create progressive goals, setting reasonable expectations, and working towards attainable and sustainable goals. She noted a need to develop a higher level of discipline noting that she did not get a chance to develop this as a child. She noted reading a book [employer] and noted this being helpful.  Therapist validated Brandy [name]'s need for boundaries with others and praised Brandy [name] for her effort of identifying areas of growth to work on. Therapist provided supportive therapy and challenged negative self-talk and distortions. Brandy [name] continues to benefit from treatment and has scheduled follow-ups.    Interventions: CBT & interpersonal.   Diagnosis:  Other bipolar disorder [facility] [name])  Psychiatric Treatment: Yes , Brandy Drone, MD.    Treatment Plan:  [name], self-aware, and motivated for change.   Support System: Family and Friend.   Client Treatment Preferences Outpatient therapy.   Client Statement of Needs Brandy [name] would like to manage symptoms day to day, manage interpersonal relationships, prioritizing self, set boundaries for self and others,  improve time-management, manage dietary intake, engage inconsistent self-care, manage emotions and frustration tolerance being more mindful, being more purposeful, more strict adherence to medication regimen.   Treatment Level Weekly  Symptoms  Anxiety: feeling anxious, difficulty managing worry, worrying too much about different things, trouble relaxing,  restlessness, irritability, and feeling afraid  something awful might happen.   (Status: maintained) Depression: loss of interest, feeling down, lethargy, poor appetite, feeling bad about self, and difficulty concentrating    (Status: maintained)  Goals:   Brandy [name] experiences symptoms of depression and anxiety.   Treatment plan signed and available on s-drive:   Yes, please see patient chart for sign off.  Brandy [name] was sent the treatment plan signature form on 09/12/23.   Target Date: 09/11/24 Frequency: Weekly  Progress: 0 Modality: individual    Therapist will provide referrals for additional resources as appropriate.  Therapist will provide psycho-education regarding Brandy [name]'s diagnosis and corresponding treatment approaches and interventions. Brandy [name], Brandy [name] will support the patient's ability to achieve the goals identified. will employ CBT, BA, Problem-solving, Solution Focused, Mindfulness,  coping skills, & other evidenced-based practices will be used to promote progress towards healthy functioning to help manage decrease symptoms associated with her diagnosis.   Reduce overall level, frequency, and intensity of the feelings of depression, anxiety and panic evidenced by decreased overall symptoms from 6 to 7 days/week to 0 to 1 days/week per client report for at least 3 consecutive months. Verbally express understanding of the relationship between feelings of depression, anxiety and their impact on thinking patterns and behaviors. Verbalize an understanding of the role that distorted thinking plays in creating fears, excessive worry, and ruminations.    Brandy [name] participated in the creation of the treatment plan)   Brandy [name], Brandy [name]

## 2024-05-07 ENCOUNTER — Ambulatory Visit: Admitting: Psychology

## 2024-05-07 DIAGNOSIS — F3189 Other bipolar disorder: Secondary | ICD-10-CM | POA: Diagnosis not present

## 2024-05-07 NOTE — Progress Notes (Signed)
 [facility] Counselor/Therapist Progress Note    Patient ID: Brandy [name], MRN: [id number]   Date: 05/07/2024  Time Spent: 12:05 pm - 1:01 pm :  56 minutes  Treatment Type: Individual Therapy.  Reported Symptoms: Bipolar, depression, and anxiety.   Mental Status Exam: Appearance:  Casual     Behavior: Appropriate and Rigid  Motor: Normal  Speech/Language:  Clear and Coherent  Affect: Congruent  Mood: anxious  Thought process: normal  Thought content:   WNL  Sensory/Perceptual disturbances:   WNL  Orientation: oriented to person, place, time/date, and situation  Attention: Good  Concentration: Good  Memory: WNL  Fund of knowledge:  Good  Insight:   Good and Fair  Judgment:  Good and Fair  Impulse Control: Good   Risk Assessment: Danger to Self:  No Self-injurious Behavior: No Danger to Others: No Duty to Warn:no Physical Aggression / Violence:No  Access to Firearms a concern: No  Gang Involvement:No   Subjective:   Brandy [name] participated from home, via video and consented to treatment. Therapist participated from home office.. We experienced technological issues during the latter portion of the session and switched to audio. >50% of the session was via video. Brandy [name] provided feedback regarding her past week. She noted following up with her close friend regarding their relationship strain. Brandy [name] noted her effort, on various occasions, to engage with her friend to resolve the conflict, to no avail.  She noted her friend being distant and noted her friend declaring that she will only respond to communication but not initiate conversation. Brandy [name] noted that she is unwilling to engage in this manner going forward. She noted feelings of irritation and frustration towards her friend. She noted confusion regarding her friend's recent communication. She noted her friend summarizing the relationship in an antithetical manner to her own. We worked on exploring this  during the session. Brandy [name] noted I am not who she want's me to be and alluded to her friends rigid expectations of her. She noted frequent uncommunicated expectations from her friend along with general rigidity. Brandy [name] noted a need for boundaries for herself and her friend going forward that would enable her to not reinforce her friend's behavior. She worked on outlining boundaries going forward with her friend. Therapist praised Brandy [name] for her effort to manage her frustration, engage purposefully, and employ tools learned in sessions. Therapist highlighted Brandy [name]'s progress in this area. Brandy [name] noted her effort to engage in free local classes focused on mindfulness at her psychiatric provider's office and noted an upcoming event. Brandy [name] noted her interest, going forward, to focus on deeper' issues going forward. Therapist validated Brandy [name]'s feelings and experience during the session and provided supportive therapy. Brandy [name] continues to benefit from continued treatment.     Interventions: CBT & interpersonal.   Diagnosis:  Other bipolar disorder [facility])  Psychiatric Treatment: Yes , Brandy Drone, MD.    Treatment Plan:  [name], self-aware, and motivated for change.   Support System: Family and Friend.   Client Treatment Preferences Outpatient therapy.   Client Statement of Needs Brandy [name] would like to manage symptoms day to day, manage interpersonal relationships, prioritizing self, set boundaries for self and others,  improve time-management, manage dietary intake, engage inconsistent self-care, manage emotions and frustration tolerance being more mindful, being more purposeful, more strict adherence to medication regimen.   Treatment Level Weekly  Symptoms  Anxiety: feeling anxious, difficulty managing worry, worrying too much about different things, trouble relaxing, restlessness, irritability,  and feeling afraid something awful might happen.   (Status:  maintained) Depression: loss of interest, feeling down, lethargy, poor appetite, feeling bad about self, and difficulty concentrating    (Status: maintained)  Goals:   Brandy [name] experiences symptoms of depression and anxiety.   Treatment plan signed and available on s-drive:   Yes, please see patient chart for sign off.  Brandy [name] was sent the treatment plan signature form on 09/12/23.   Target Date: 09/11/24 Frequency: Weekly  Progress: 0 Modality: individual    Therapist will provide referrals for additional resources as appropriate.  Therapist will provide psycho-education regarding Brandy [name]'s diagnosis and corresponding treatment approaches and interventions. Brandy [name] will support the patient's ability to achieve the goals identified. will employ CBT, BA, Problem-solving, Solution Focused, Mindfulness,  coping skills, & other evidenced-based practices will be used to promote progress towards healthy functioning to help manage decrease symptoms associated with her diagnosis.   Reduce overall level, frequency, and intensity of the feelings of depression, anxiety and panic evidenced by decreased overall symptoms from 6 to 7 days/week to 0 to 1 days/week per client report for at least 3 consecutive months. Verbally express understanding of the relationship between feelings of depression, anxiety and their impact on thinking patterns and behaviors. Verbalize an understanding of the role that distorted thinking plays in creating fears, excessive worry, and ruminations.    [name] participated in the creation of the treatment plan)   Brandy [name]

## 2024-05-14 ENCOUNTER — Ambulatory Visit: Admitting: Psychology

## 2024-05-14 DIAGNOSIS — F3189 Other bipolar disorder: Secondary | ICD-10-CM | POA: Diagnosis not present

## 2024-05-14 NOTE — Progress Notes (Signed)
 [facility] Counselor/Therapist Progress Note    Patient ID: GLENNA BRUNKOW, MRN: [id number]   Date: 05/14/24  Time Spent: 12:08 pm - 12:39 pm :  31 minutes  Treatment Type: Individual Therapy.  Reported Symptoms: Bipolar, depression, and anxiety.   Mental Status Exam: Appearance:  Casual     Behavior: Appropriate and Rigid  Motor: Normal  Speech/Language:  Clear and Coherent  Affect: Congruent  Mood: anxious and irritable  Thought process: normal  Thought content:   WNL  Sensory/Perceptual disturbances:   WNL  Orientation: oriented to person, place, time/date, and situation  Attention: Good  Concentration: Good  Memory: WNL  Fund of knowledge:  Good  Insight:   Good and Fair  Judgment:  Good and Fair  Impulse Control: Good   Risk Assessment: Danger to Self:  No Self-injurious Behavior: No Danger to Others: No Duty to Warn:no Physical Aggression / Violence:No  Access to Firearms a concern: No  Gang Involvement:No   Subjective:   Rosslyn Pasion Peed participated from home, via video and consented to treatment. Therapist participated from home office. She noted continued strain with her best friend and discussed feelings of sadness that they are not engaging as they typically do. She noted her continued frustration regarding her friend's behavior and noted not wanting to play that game. She noted her interest in delving into more in-depth issues including past events and childhood issues during the session, which therapist acknowledged. She noted her interest in the therapist guiding the session in regards to selecting topics of discussions, making decisions of where to focus, and taking the lead in sessions going forward. We worked on exploring this need during the session and the drive for this dynamic. Therapist discussed the role of the therapist and the patient in depth. The therapist validated Cynthie's need to discuss various in-depth topics, but noted the  importance of the patient's role in this regards. Aleeyah was resistant to this during the session and expressed her frustration during the session. We worked on exploring this during the session. Tynisha noted her belief that this is the therapist's role. We continued to explore this and therapist validated Aphrodite's feelings, while affirming the differences in the role of the therapist and patient. Therapist encouraged Deangela to work on identifying the areas she would like to visit, in session, and that the therapist would support while both the therapist and patient maintaining their respective roles. Maizy was resistant to this and noted her consideration of this going forward. She elected to end the session at this time. Therapist validated Yailine's feelings and experience, while challenging Frederick's schema's regarding therapy and therapeutic roles. We will continue to exploring and processing this area going forward, driving factors for her perspective regarding roles in treatment, and her feelings regarding the therapist's response to her request. Therapist provided supportive therapy. Keon has a scheduled follow-up and continues to benefit from treatment.     Interventions: CBT & interpersonal.   Diagnosis:  Other bipolar disorder [facility])  Psychiatric Treatment: Yes , [name], MD.    Treatment Plan:  Client Abilities/Strengths Canary is intelligent, self-aware, and motivated for change.   Support System: Family and Friend.   Client Treatment Preferences Outpatient therapy.   Client Statement of Needs Bular would like to manage symptoms day to day, manage interpersonal relationships, prioritizing self, set boundaries for self and others,  improve time-management, manage dietary intake, engage inconsistent self-care, manage emotions and frustration tolerance being more mindful, being more purposeful, more  strict adherence to medication regimen.   Treatment Level Weekly  Symptoms  Anxiety: feeling  anxious, difficulty managing worry, worrying too much about different things, trouble relaxing, restlessness, irritability, and feeling afraid something awful might happen.   (Status: maintained) Depression: loss of interest, feeling down, lethargy, poor appetite, feeling bad about self, and difficulty concentrating    (Status: maintained)  Goals:   Logen experiences symptoms of depression and anxiety.   Treatment plan signed and available on s-drive:   Yes, please see patient chart for sign off.  Marcayla Budge Ziesmer was sent the treatment plan signature form on 09/12/23.   Target Date: 09/11/24 Frequency: Weekly  Progress: 0 Modality: individual    Therapist will provide referrals for additional resources as appropriate.  Therapist will provide psycho-education regarding Lynnet's diagnosis and corresponding treatment approaches and interventions. [name], LCSW will support the patient's ability to achieve the goals identified. will employ CBT, BA, Problem-solving, Solution Focused, Mindfulness,  coping skills, & other evidenced-based practices will be used to promote progress towards healthy functioning to help manage decrease symptoms associated with her diagnosis.   Reduce overall level, frequency, and intensity of the feelings of depression, anxiety and panic evidenced by decreased overall symptoms from 6 to 7 days/week to 0 to 1 days/week per client report for at least 3 consecutive months. Verbally express understanding of the relationship between feelings of depression, anxiety and their impact on thinking patterns and behaviors. Verbalize an understanding of the role that distorted thinking plays in creating fears, excessive worry, and ruminations.    [name] participated in the creation of the treatment plan)   [name], LCSW

## 2024-05-21 ENCOUNTER — Ambulatory Visit: Admitting: Psychology

## 2024-05-21 DIAGNOSIS — F3189 Other bipolar disorder: Secondary | ICD-10-CM | POA: Diagnosis not present

## 2024-05-21 NOTE — Progress Notes (Signed)
 [facility] Counselor/Therapist Progress Note    Patient ID: TWANIA BUJAK, MRN: [id number]   Date: 05/21/24  Time Spent: 12:06 pm - 1:03 pm :  57 minutes  Treatment Type: Individual Therapy.  Reported Symptoms: Bipolar, depression, and anxiety.   Mental Status Exam: Appearance:  Casual     Behavior: Appropriate and Rigid  Motor: Normal  Speech/Language:  Clear and Coherent  Affect: Congruent  Mood: anxious and dysthymic  Thought process: normal  Thought content:   WNL  Sensory/Perceptual disturbances:   WNL  Orientation: oriented to person, place, time/date, and situation  Attention: Good  Concentration: Good  Memory: WNL  Fund of knowledge:  Good  Insight:   Good and Fair  Judgment:  Good and Fair  Impulse Control: Good   Risk Assessment: Danger to Self:  No Self-injurious Behavior: No Danger to Others: No Duty to Warn:no Physical Aggression / Violence:No  Access to Firearms a concern: No  Gang Involvement:No   Subjective:   Dedra Matsuo Mccauley participated from home, via video and consented to treatment. Therapist participated from home office. We worked on reflecting on the previous session and the differing stances between [place] and Paramedic. Melvia noted noted maintaining her stance from the previous session. Therapist highlighted the importance of role and patient's agency in treatment and further defined roles, during the session. She noted her interest in moving forward, in treatment, but noted feelings of frustration regarding this discussion and overall structure. Therapist validated Annsleigh's feelings and we worked on exploring contributing factors to her perspective while maintaining boundaries. She noted feeling a sense of immense responsibility for her family, from the onset of the relationship, and references the 17s mom. She noted taking on this role because that's what I wanted to be for my family. She noted having to balance work, caring for  the family, and ensure that everything goes well. She noted the importance of balancing the needs of others with the needs for self. She noted often feeling judged by others when she attempts to build and maintain that balance. She noted both Burnard (daughter) and Jacques (Anna's daughter) often judging others, and her, for having interests outside of the role of home-maker. She noted feeling judged by a recent interaction, despite this being her perception and not being given clearcut feedback. She noted contributing factors include feeling judged by others, general strain with her friend [name]. We will work on exploring this going forward. Additional areas that Maurina would like to concentrate on are her feelings of loneliness, a need for boundaries, and balancing doing for others and a sense of security. Therapist praised Masa for her vulnerability and provided supportive therapy. A follow-up was scheduled for continued treatment, which she benefits from.    Loneliness, boundaries, care for others verses security.     Interventions: CBT & interpersonal.   Diagnosis:  Other bipolar disorder [facility])  Psychiatric Treatment: Yes , [name], MD.    Treatment Plan:  Client Abilities/Strengths Kristelle is intelligent, self-aware, and motivated for change.   Support System: Family and Friend.   Client Treatment Preferences Outpatient therapy.   Client Statement of Needs Christa would like to manage symptoms day to day, manage interpersonal relationships, prioritizing self, set boundaries for self and others,  improve time-management, manage dietary intake, engage inconsistent self-care, manage emotions and frustration tolerance being more mindful, being more purposeful, more strict adherence to medication regimen.   Treatment Level Weekly  Symptoms  Anxiety: feeling anxious, difficulty managing worry,  worrying too much about different things, trouble relaxing, restlessness, irritability, and  feeling afraid something awful might happen.   (Status: maintained) Depression: loss of interest, feeling down, lethargy, poor appetite, feeling bad about self, and difficulty concentrating    (Status: maintained)  Goals:   Niyana experiences symptoms of depression and anxiety.   Treatment plan signed and available on s-drive:   Yes, please see patient chart for sign off.  Nahdia Doucet Bachus was sent the treatment plan signature form on 09/12/23.   Target Date: 09/11/24 Frequency: Weekly  Progress: 0 Modality: individual    Therapist will provide referrals for additional resources as appropriate.  Therapist will provide psycho-education regarding Ozella's diagnosis and corresponding treatment approaches and interventions. [name], LCSW will support the patient's ability to achieve the goals identified. will employ CBT, BA, Problem-solving, Solution Focused, Mindfulness,  coping skills, & other evidenced-based practices will be used to promote progress towards healthy functioning to help manage decrease symptoms associated with her diagnosis.   Reduce overall level, frequency, and intensity of the feelings of depression, anxiety and panic evidenced by decreased overall symptoms from 6 to 7 days/week to 0 to 1 days/week per client report for at least 3 consecutive months. Verbally express understanding of the relationship between feelings of depression, anxiety and their impact on thinking patterns and behaviors. Verbalize an understanding of the role that distorted thinking plays in creating fears, excessive worry, and ruminations.    [name] participated in the creation of the treatment plan)   [name], LCSW

## 2024-05-28 ENCOUNTER — Ambulatory Visit (INDEPENDENT_AMBULATORY_CARE_PROVIDER_SITE_OTHER): Admitting: Psychology

## 2024-05-28 DIAGNOSIS — F3189 Other bipolar disorder: Secondary | ICD-10-CM

## 2024-05-28 NOTE — Progress Notes (Signed)
 [facility] Counselor/Therapist Progress Note    Patient ID: Brandy [name], MRN: [id number]   Date: 05/28/24  Time Spent: 12:05 pm - 1:01 pm :  56 minutes  Treatment Type: Individual Therapy.  Reported Symptoms: Bipolar, depression, and anxiety.   Mental Status Exam: Appearance:  Casual     Behavior: Appropriate and Rigid  Motor: Normal  Speech/Language:  Clear and Coherent  Affect: Congruent  Mood: anxious  Thought process: normal  Thought content:   WNL  Sensory/Perceptual disturbances:   WNL  Orientation: oriented to person, place, time/date, and situation  Attention: Good  Concentration: Good  Memory: WNL  Fund of knowledge:  Good  Insight:   Good and Fair  Judgment:  Good and Fair  Impulse Control: Good   Risk Assessment: Danger to Self:  No Self-injurious Behavior: No Danger to Others: No Duty to Warn:no Physical Aggression / Violence:No  Access to Firearms a concern: No  Gang Involvement:No   Subjective:   Brandy [name] participated from home, via video and consented to treatment. Therapist participated from home office. Brandy [name] noted that it's been a morning. She noted trying to stay grounded, patient, and calm. She noted finding her self getting frustrated with Ed, noting that he has a tough time handling their son. She noted having to talk both her son and husband down. She noted a sense of responsibility in this area and for both her son and husband. She noted frustration regarding her husband's low frustration tolerance. She noted husband needing to manage his own stress level during these times more proactively. She noted her family's suggestion that she is enabling [name]. Brandy [name] noted her frustration regarding Ed's expectations for Brandy [name]'s behavior due to his cognitive deficits. She noted her own efforts to communicate with her son's doctor regarding a different treatment approach but noted frustration regarding the lack of response  regarding follow-up with the aforementioned plan. She provided history regarding her son's childhood, diagnosis of PDD, and early treatment. She noted her goal, at the time, to learn as much as possible about the diagnosis and noted her intent to tackle this. She noted the various resources and support that her son received from an early age. She noted the difficulty balancing three children, home life, caring for her mother, and working. We worked on identifying possible tools and supports that could be available to aid in addressing Brandy [name]'s needs. Brandy [name] noted openness to this but noted her worry related to how this could possibly fit in her day-to-day life. We worked on exploring this during the session. Brandy [name] was receptive to this recommendation and noted her intent to research this. Therapist praised Brandy [name] for her effort and vulnerability during the session. Therapist validated Brandy [name]'s feeling and experience. Therapist encouraged self-care and provided supportive therapy. Brandy [name] has a follow-up and continues to benefit from treatment.   Interventions: CBT & interpersonal.   Diagnosis:  Other bipolar disorder [facility])  Psychiatric Treatment: Yes , [name], MD.    Treatment Plan:  [name], self-aware, and motivated for change.   Support System: Family and Friend.   Client Treatment Preferences Outpatient therapy.   Client Statement of Needs Brandy [name] would like to manage symptoms day to day, manage interpersonal relationships, prioritizing self, set boundaries for self and others,  improve time-management, manage dietary intake, engage inconsistent self-care, manage emotions and frustration tolerance being more mindful, being more purposeful, more strict adherence to medication regimen.   Treatment Level Weekly  Symptoms  Anxiety: feeling anxious, difficulty managing worry, worrying too much about different things, trouble relaxing, restlessness,  irritability, and feeling afraid something awful might happen.   (Status: maintained) Depression: loss of interest, feeling down, lethargy, poor appetite, feeling bad about self, and difficulty concentrating    (Status: maintained)  Goals:   Brandy [name] experiences symptoms of depression and anxiety.   Treatment plan signed and available on s-drive:   Yes, please see patient chart for sign off.  Brandy [name] was sent the treatment plan signature form on 09/12/23.   Target Date: 09/11/24 Frequency: Weekly  Progress: 10% Modality: individual    Therapist will provide referrals for additional resources as appropriate.  Therapist will provide psycho-education regarding Brandy [name] diagnosis and corresponding treatment approaches and interventions. Brandy Mullet, LCSW will support the patient's ability to achieve the goals identified. will employ CBT, BA, Problem-solving, Solution Focused, Mindfulness,  coping skills, & other evidenced-based practices will be used to promote progress towards healthy functioning to help manage decrease symptoms associated with her diagnosis.   Reduce overall level, frequency, and intensity of the feelings of depression, anxiety and panic evidenced by decreased overall symptoms from 6 to 7 days/week to 0 to 1 days/week per client report for at least 3 consecutive months. Verbally express understanding of the relationship between feelings of depression, anxiety and their impact on thinking patterns and behaviors. Verbalize an understanding of the role that distorted thinking plays in creating fears, excessive worry, and ruminations.    Brandy [name] participated in the creation of the treatment plan)   Brandy Mullet, LCSW

## 2024-06-04 ENCOUNTER — Ambulatory Visit: Admitting: Psychology

## 2024-06-09 ENCOUNTER — Ambulatory Visit: Payer: HMO

## 2024-06-11 ENCOUNTER — Ambulatory Visit: Admitting: Psychology

## 2024-06-18 ENCOUNTER — Ambulatory Visit: Admitting: Psychology

## 2024-06-25 ENCOUNTER — Ambulatory Visit: Admitting: Psychology

## 2024-07-02 ENCOUNTER — Ambulatory Visit: Admitting: Psychology

## 2024-07-09 ENCOUNTER — Ambulatory Visit: Admitting: Psychology

## 2024-07-16 ENCOUNTER — Ambulatory Visit: Admitting: Psychology

## 2024-07-23 ENCOUNTER — Ambulatory Visit: Admitting: Psychology
